# Patient Record
Sex: Female | Born: 1935 | Race: White | Hispanic: No | Marital: Married | Smoking: Former smoker
Health system: Southern US, Community
[De-identification: ages and names within clinical notes are randomized; demographics above are authoritative.]

## PROBLEM LIST (undated history)

## (undated) DIAGNOSIS — M1990 Unspecified osteoarthritis, unspecified site: Secondary | ICD-10-CM

## (undated) DIAGNOSIS — K219 Gastro-esophageal reflux disease without esophagitis: Secondary | ICD-10-CM

## (undated) DIAGNOSIS — L409 Psoriasis, unspecified: Secondary | ICD-10-CM

## (undated) DIAGNOSIS — I071 Rheumatic tricuspid insufficiency: Secondary | ICD-10-CM

## (undated) DIAGNOSIS — I2510 Atherosclerotic heart disease of native coronary artery without angina pectoris: Secondary | ICD-10-CM

## (undated) DIAGNOSIS — M1711 Unilateral primary osteoarthritis, right knee: Secondary | ICD-10-CM

## (undated) DIAGNOSIS — I219 Acute myocardial infarction, unspecified: Secondary | ICD-10-CM

## (undated) DIAGNOSIS — I519 Heart disease, unspecified: Secondary | ICD-10-CM

## (undated) DIAGNOSIS — I4891 Unspecified atrial fibrillation: Secondary | ICD-10-CM

## (undated) DIAGNOSIS — Z9981 Dependence on supplemental oxygen: Secondary | ICD-10-CM

## (undated) DIAGNOSIS — I10 Essential (primary) hypertension: Secondary | ICD-10-CM

## (undated) DIAGNOSIS — I482 Chronic atrial fibrillation, unspecified: Secondary | ICD-10-CM

## (undated) DIAGNOSIS — E785 Hyperlipidemia, unspecified: Secondary | ICD-10-CM

## (undated) DIAGNOSIS — I272 Pulmonary hypertension, unspecified: Secondary | ICD-10-CM

## (undated) HISTORY — DX: Hyperlipidemia, unspecified: E78.5

## (undated) HISTORY — PX: ACHILLES TENDON SURGERY: SHX542

## (undated) HISTORY — PX: EYE SURGERY: SHX253

## (undated) HISTORY — DX: Pulmonary hypertension, unspecified: I27.20

## (undated) HISTORY — DX: Rheumatic tricuspid insufficiency: I07.1

## (undated) HISTORY — DX: Heart disease, unspecified: I51.9

## (undated) HISTORY — PX: LAPAROSCOPIC CHOLECYSTECTOMY: SUR755

## (undated) HISTORY — DX: Psoriasis, unspecified: L40.9

## (undated) HISTORY — DX: Essential (primary) hypertension: I10

## (undated) HISTORY — DX: Morbid (severe) obesity due to excess calories: E66.01

## (undated) HISTORY — PX: COLONOSCOPY: SHX174

## (undated) HISTORY — DX: Chronic atrial fibrillation, unspecified: I48.20

## (undated) HISTORY — DX: Unilateral primary osteoarthritis, right knee: M17.11

## (undated) HISTORY — DX: Atherosclerotic heart disease of native coronary artery without angina pectoris: I25.10

## (undated) HISTORY — PX: RETINAL DETACHMENT SURGERY: SHX105

---

## 2001-12-18 ENCOUNTER — Ambulatory Visit (HOSPITAL_COMMUNITY): Admission: RE | Admit: 2001-12-18 | Discharge: 2001-12-18 | Payer: Self-pay | Admitting: Family Medicine

## 2001-12-18 ENCOUNTER — Encounter: Payer: Self-pay | Admitting: Family Medicine

## 2002-03-18 ENCOUNTER — Encounter: Payer: Self-pay | Admitting: Family Medicine

## 2002-03-18 ENCOUNTER — Ambulatory Visit (HOSPITAL_COMMUNITY): Admission: RE | Admit: 2002-03-18 | Discharge: 2002-03-18 | Payer: Self-pay | Admitting: Family Medicine

## 2002-04-26 ENCOUNTER — Encounter: Admission: RE | Admit: 2002-04-26 | Discharge: 2002-07-25 | Payer: Self-pay | Admitting: Family Medicine

## 2002-06-11 ENCOUNTER — Encounter: Payer: Self-pay | Admitting: Surgery

## 2002-06-16 ENCOUNTER — Encounter (INDEPENDENT_AMBULATORY_CARE_PROVIDER_SITE_OTHER): Payer: Self-pay | Admitting: Specialist

## 2002-06-16 ENCOUNTER — Observation Stay (HOSPITAL_COMMUNITY): Admission: RE | Admit: 2002-06-16 | Discharge: 2002-06-17 | Payer: Self-pay | Admitting: Surgery

## 2003-07-25 ENCOUNTER — Ambulatory Visit (HOSPITAL_COMMUNITY): Admission: RE | Admit: 2003-07-25 | Discharge: 2003-07-25 | Payer: Self-pay | Admitting: Family Medicine

## 2003-08-15 ENCOUNTER — Encounter: Admission: RE | Admit: 2003-08-15 | Discharge: 2003-08-15 | Payer: Self-pay | Admitting: Orthopedic Surgery

## 2003-08-18 ENCOUNTER — Ambulatory Visit (HOSPITAL_COMMUNITY): Admission: RE | Admit: 2003-08-18 | Discharge: 2003-08-18 | Payer: Self-pay | Admitting: Family Medicine

## 2003-09-28 ENCOUNTER — Ambulatory Visit (HOSPITAL_COMMUNITY): Admission: RE | Admit: 2003-09-28 | Discharge: 2003-09-28 | Payer: Self-pay | Admitting: Internal Medicine

## 2005-07-19 DIAGNOSIS — I219 Acute myocardial infarction, unspecified: Secondary | ICD-10-CM

## 2005-07-19 HISTORY — DX: Acute myocardial infarction, unspecified: I21.9

## 2005-07-19 HISTORY — PX: CORONARY ANGIOPLASTY WITH STENT PLACEMENT: SHX49

## 2005-07-21 ENCOUNTER — Inpatient Hospital Stay (HOSPITAL_COMMUNITY): Admission: EM | Admit: 2005-07-21 | Discharge: 2005-07-23 | Payer: Self-pay | Admitting: Emergency Medicine

## 2005-07-21 ENCOUNTER — Encounter: Payer: Self-pay | Admitting: Emergency Medicine

## 2005-07-21 ENCOUNTER — Ambulatory Visit: Payer: Self-pay | Admitting: Cardiology

## 2005-07-25 ENCOUNTER — Encounter: Payer: Self-pay | Admitting: Emergency Medicine

## 2005-07-26 ENCOUNTER — Inpatient Hospital Stay (HOSPITAL_COMMUNITY): Admission: EM | Admit: 2005-07-26 | Discharge: 2005-07-27 | Payer: Self-pay | Admitting: Emergency Medicine

## 2005-07-30 ENCOUNTER — Ambulatory Visit: Payer: Self-pay | Admitting: Cardiology

## 2005-08-02 ENCOUNTER — Inpatient Hospital Stay (HOSPITAL_COMMUNITY): Admission: EM | Admit: 2005-08-02 | Discharge: 2005-08-03 | Payer: Self-pay | Admitting: Emergency Medicine

## 2005-08-21 ENCOUNTER — Inpatient Hospital Stay (HOSPITAL_COMMUNITY): Admission: EM | Admit: 2005-08-21 | Discharge: 2005-08-22 | Payer: Self-pay | Admitting: Emergency Medicine

## 2005-08-21 ENCOUNTER — Ambulatory Visit: Payer: Self-pay | Admitting: *Deleted

## 2005-08-26 ENCOUNTER — Ambulatory Visit: Payer: Self-pay | Admitting: *Deleted

## 2005-08-29 ENCOUNTER — Encounter (HOSPITAL_COMMUNITY): Admission: RE | Admit: 2005-08-29 | Discharge: 2005-09-28 | Payer: Self-pay | Admitting: *Deleted

## 2005-08-30 ENCOUNTER — Ambulatory Visit: Payer: Self-pay | Admitting: Internal Medicine

## 2005-09-03 ENCOUNTER — Ambulatory Visit: Payer: Self-pay | Admitting: *Deleted

## 2005-09-11 ENCOUNTER — Ambulatory Visit: Payer: Self-pay | Admitting: *Deleted

## 2005-09-23 ENCOUNTER — Emergency Department (HOSPITAL_COMMUNITY): Admission: EM | Admit: 2005-09-23 | Discharge: 2005-09-23 | Payer: Self-pay | Admitting: Emergency Medicine

## 2005-09-30 ENCOUNTER — Encounter (HOSPITAL_COMMUNITY): Admission: RE | Admit: 2005-09-30 | Discharge: 2005-10-30 | Payer: Self-pay | Admitting: *Deleted

## 2005-10-23 ENCOUNTER — Ambulatory Visit: Payer: Self-pay | Admitting: *Deleted

## 2005-10-31 ENCOUNTER — Encounter (HOSPITAL_COMMUNITY): Admission: RE | Admit: 2005-10-31 | Discharge: 2005-11-30 | Payer: Self-pay | Admitting: *Deleted

## 2005-11-07 ENCOUNTER — Ambulatory Visit: Payer: Self-pay | Admitting: *Deleted

## 2005-12-02 ENCOUNTER — Encounter (HOSPITAL_COMMUNITY): Admission: RE | Admit: 2005-12-02 | Discharge: 2005-12-27 | Payer: Self-pay | Admitting: *Deleted

## 2005-12-04 ENCOUNTER — Ambulatory Visit: Payer: Self-pay | Admitting: *Deleted

## 2005-12-27 ENCOUNTER — Encounter (HOSPITAL_COMMUNITY): Admission: RE | Admit: 2005-12-27 | Discharge: 2006-01-26 | Payer: Self-pay | Admitting: *Deleted

## 2006-01-03 ENCOUNTER — Ambulatory Visit: Payer: Self-pay | Admitting: *Deleted

## 2006-01-27 ENCOUNTER — Ambulatory Visit: Payer: Self-pay | Admitting: *Deleted

## 2006-02-05 ENCOUNTER — Ambulatory Visit: Payer: Self-pay | Admitting: *Deleted

## 2006-02-26 ENCOUNTER — Encounter (HOSPITAL_COMMUNITY): Admission: RE | Admit: 2006-02-26 | Discharge: 2006-03-28 | Payer: Self-pay | Admitting: *Deleted

## 2006-05-20 ENCOUNTER — Ambulatory Visit: Payer: Self-pay | Admitting: Cardiology

## 2006-07-15 ENCOUNTER — Ambulatory Visit: Payer: Self-pay | Admitting: Cardiovascular Disease

## 2006-08-05 ENCOUNTER — Ambulatory Visit (HOSPITAL_COMMUNITY): Admission: RE | Admit: 2006-08-05 | Discharge: 2006-08-05 | Payer: Self-pay | Admitting: Family Medicine

## 2006-08-14 ENCOUNTER — Ambulatory Visit: Payer: Self-pay | Admitting: Cardiology

## 2006-08-24 ENCOUNTER — Emergency Department (HOSPITAL_COMMUNITY): Admission: EM | Admit: 2006-08-24 | Discharge: 2006-08-25 | Payer: Self-pay | Admitting: Emergency Medicine

## 2006-09-01 ENCOUNTER — Ambulatory Visit: Payer: Self-pay | Admitting: Gastroenterology

## 2006-09-18 ENCOUNTER — Ambulatory Visit: Payer: Self-pay | Admitting: Cardiology

## 2006-09-26 ENCOUNTER — Ambulatory Visit: Payer: Self-pay | Admitting: Gastroenterology

## 2006-10-02 ENCOUNTER — Ambulatory Visit: Payer: Self-pay | Admitting: Cardiology

## 2006-10-30 ENCOUNTER — Ambulatory Visit: Payer: Self-pay | Admitting: Cardiology

## 2006-11-27 ENCOUNTER — Ambulatory Visit: Payer: Self-pay | Admitting: Cardiology

## 2006-12-20 ENCOUNTER — Emergency Department (HOSPITAL_COMMUNITY): Admission: EM | Admit: 2006-12-20 | Discharge: 2006-12-20 | Payer: Self-pay | Admitting: Emergency Medicine

## 2006-12-30 ENCOUNTER — Ambulatory Visit: Payer: Self-pay | Admitting: Cardiology

## 2007-01-29 ENCOUNTER — Ambulatory Visit: Payer: Self-pay | Admitting: Internal Medicine

## 2007-03-02 ENCOUNTER — Ambulatory Visit: Payer: Self-pay | Admitting: Cardiology

## 2007-04-03 ENCOUNTER — Ambulatory Visit: Payer: Self-pay | Admitting: Internal Medicine

## 2007-05-01 ENCOUNTER — Ambulatory Visit: Payer: Self-pay | Admitting: Cardiology

## 2007-05-08 ENCOUNTER — Ambulatory Visit: Payer: Self-pay | Admitting: Internal Medicine

## 2007-05-11 ENCOUNTER — Ambulatory Visit (HOSPITAL_COMMUNITY): Admission: RE | Admit: 2007-05-11 | Discharge: 2007-05-11 | Payer: Self-pay | Admitting: Chiropractic Medicine

## 2007-05-11 ENCOUNTER — Ambulatory Visit: Payer: Self-pay | Admitting: Cardiology

## 2007-05-29 ENCOUNTER — Ambulatory Visit: Payer: Self-pay | Admitting: Internal Medicine

## 2007-07-03 ENCOUNTER — Ambulatory Visit: Payer: Self-pay | Admitting: Cardiology

## 2007-08-24 ENCOUNTER — Ambulatory Visit (HOSPITAL_COMMUNITY): Admission: RE | Admit: 2007-08-24 | Discharge: 2007-08-24 | Payer: Self-pay | Admitting: Family Medicine

## 2007-08-24 ENCOUNTER — Ambulatory Visit: Payer: Self-pay | Admitting: Cardiology

## 2007-09-21 ENCOUNTER — Ambulatory Visit: Payer: Self-pay | Admitting: Cardiology

## 2007-10-20 ENCOUNTER — Ambulatory Visit: Payer: Self-pay | Admitting: Cardiology

## 2007-11-20 ENCOUNTER — Ambulatory Visit: Payer: Self-pay | Admitting: Cardiology

## 2007-12-18 ENCOUNTER — Ambulatory Visit: Payer: Self-pay | Admitting: Internal Medicine

## 2007-12-24 ENCOUNTER — Ambulatory Visit: Payer: Self-pay | Admitting: Cardiology

## 2007-12-24 ENCOUNTER — Encounter (HOSPITAL_COMMUNITY): Admission: RE | Admit: 2007-12-24 | Discharge: 2008-01-23 | Payer: Self-pay | Admitting: Internal Medicine

## 2007-12-25 ENCOUNTER — Ambulatory Visit: Payer: Self-pay | Admitting: Internal Medicine

## 2008-01-12 ENCOUNTER — Ambulatory Visit: Payer: Self-pay | Admitting: Cardiology

## 2008-01-22 ENCOUNTER — Ambulatory Visit: Payer: Self-pay | Admitting: Cardiology

## 2008-02-26 ENCOUNTER — Ambulatory Visit: Payer: Self-pay | Admitting: Cardiology

## 2008-03-01 ENCOUNTER — Ambulatory Visit: Payer: Self-pay | Admitting: Cardiology

## 2008-03-15 ENCOUNTER — Ambulatory Visit: Payer: Self-pay | Admitting: Cardiology

## 2008-04-10 ENCOUNTER — Emergency Department (HOSPITAL_COMMUNITY): Admission: EM | Admit: 2008-04-10 | Discharge: 2008-04-10 | Payer: Self-pay | Admitting: Emergency Medicine

## 2008-04-14 ENCOUNTER — Ambulatory Visit: Payer: Self-pay | Admitting: Cardiology

## 2008-04-28 ENCOUNTER — Ambulatory Visit: Payer: Self-pay | Admitting: Cardiology

## 2008-05-19 ENCOUNTER — Ambulatory Visit: Payer: Self-pay | Admitting: Cardiology

## 2008-06-09 ENCOUNTER — Ambulatory Visit: Payer: Self-pay | Admitting: Cardiology

## 2008-07-11 ENCOUNTER — Ambulatory Visit: Payer: Self-pay | Admitting: Cardiology

## 2008-07-28 ENCOUNTER — Ambulatory Visit: Payer: Self-pay | Admitting: Cardiology

## 2008-08-29 ENCOUNTER — Ambulatory Visit: Payer: Self-pay | Admitting: Cardiology

## 2008-09-15 ENCOUNTER — Ambulatory Visit: Payer: Self-pay | Admitting: Cardiology

## 2008-10-10 ENCOUNTER — Ambulatory Visit: Payer: Self-pay | Admitting: Cardiology

## 2008-10-14 ENCOUNTER — Ambulatory Visit: Payer: Self-pay | Admitting: Cardiology

## 2008-11-17 ENCOUNTER — Ambulatory Visit: Payer: Self-pay | Admitting: Cardiology

## 2008-12-08 ENCOUNTER — Ambulatory Visit: Payer: Self-pay | Admitting: Cardiology

## 2008-12-29 ENCOUNTER — Ambulatory Visit: Payer: Self-pay | Admitting: Cardiology

## 2009-01-30 ENCOUNTER — Ambulatory Visit: Payer: Self-pay | Admitting: Cardiology

## 2009-02-10 ENCOUNTER — Ambulatory Visit (HOSPITAL_COMMUNITY): Admission: RE | Admit: 2009-02-10 | Discharge: 2009-02-10 | Payer: Self-pay | Admitting: Family Medicine

## 2009-03-06 ENCOUNTER — Ambulatory Visit: Payer: Self-pay | Admitting: Cardiology

## 2009-04-03 ENCOUNTER — Encounter: Payer: Self-pay | Admitting: *Deleted

## 2009-04-03 ENCOUNTER — Ambulatory Visit: Payer: Self-pay | Admitting: Cardiology

## 2009-04-17 ENCOUNTER — Encounter: Payer: Self-pay | Admitting: Cardiology

## 2009-04-17 DIAGNOSIS — E663 Overweight: Secondary | ICD-10-CM | POA: Insufficient documentation

## 2009-04-17 DIAGNOSIS — E785 Hyperlipidemia, unspecified: Secondary | ICD-10-CM

## 2009-04-17 DIAGNOSIS — I10 Essential (primary) hypertension: Secondary | ICD-10-CM | POA: Insufficient documentation

## 2009-04-18 ENCOUNTER — Ambulatory Visit: Payer: Self-pay | Admitting: Cardiology

## 2009-04-18 LAB — CONVERTED CEMR LAB
ALT: 17 units/L (ref 0–35)
AST: 28 units/L (ref 0–37)
Albumin: 3.7 g/dL (ref 3.5–5.2)
HDL: 39.6 mg/dL (ref >39.00)
LDL Cholesterol: 48 mg/dL (ref 0–99)
Total Bilirubin: 1 mg/dL (ref 0.3–1.2)
Total CHOL/HDL Ratio: 3
Total Protein: 7.1 g/dL (ref 6.0–8.3)

## 2009-05-01 ENCOUNTER — Ambulatory Visit: Payer: Self-pay

## 2009-05-29 ENCOUNTER — Ambulatory Visit: Payer: Self-pay | Admitting: Cardiology

## 2009-06-26 ENCOUNTER — Ambulatory Visit: Payer: Self-pay | Admitting: Cardiology

## 2009-07-31 ENCOUNTER — Ambulatory Visit: Payer: Self-pay | Admitting: Cardiology

## 2009-08-30 ENCOUNTER — Ambulatory Visit: Payer: Self-pay | Admitting: Cardiology

## 2009-09-28 ENCOUNTER — Ambulatory Visit: Payer: Self-pay | Admitting: Cardiology

## 2009-10-26 ENCOUNTER — Ambulatory Visit: Payer: Self-pay | Admitting: Cardiology

## 2009-11-23 ENCOUNTER — Ambulatory Visit: Payer: Self-pay | Admitting: Cardiology

## 2009-11-23 LAB — CONVERTED CEMR LAB: POC INR: 3.2

## 2009-12-27 ENCOUNTER — Ambulatory Visit: Payer: Self-pay | Admitting: Cardiology

## 2009-12-27 LAB — CONVERTED CEMR LAB: POC INR: 4

## 2010-01-03 ENCOUNTER — Ambulatory Visit: Payer: Self-pay | Admitting: Cardiology

## 2010-01-24 ENCOUNTER — Ambulatory Visit: Payer: Self-pay | Admitting: Cardiology

## 2010-01-24 LAB — CONVERTED CEMR LAB: POC INR: 3

## 2010-02-05 ENCOUNTER — Telehealth: Payer: Self-pay | Admitting: Gastroenterology

## 2010-02-06 ENCOUNTER — Encounter (INDEPENDENT_AMBULATORY_CARE_PROVIDER_SITE_OTHER): Payer: Self-pay | Admitting: *Deleted

## 2010-02-21 ENCOUNTER — Ambulatory Visit: Payer: Self-pay | Admitting: Cardiovascular Disease

## 2010-02-21 LAB — CONVERTED CEMR LAB: POC INR: 3.5

## 2010-03-19 ENCOUNTER — Ambulatory Visit: Payer: Self-pay | Admitting: Cardiology

## 2010-03-19 LAB — CONVERTED CEMR LAB: POC INR: 2.9

## 2010-04-03 ENCOUNTER — Ambulatory Visit: Payer: Self-pay | Admitting: Cardiology

## 2010-04-16 ENCOUNTER — Ambulatory Visit: Payer: Self-pay | Admitting: Cardiology

## 2010-04-17 ENCOUNTER — Ambulatory Visit: Payer: Self-pay | Admitting: Internal Medicine

## 2010-04-17 DIAGNOSIS — Z860100 Personal history of colon polyps, unspecified: Secondary | ICD-10-CM | POA: Insufficient documentation

## 2010-04-17 DIAGNOSIS — K219 Gastro-esophageal reflux disease without esophagitis: Secondary | ICD-10-CM

## 2010-04-19 ENCOUNTER — Telehealth: Payer: Self-pay | Admitting: Cardiology

## 2010-04-20 ENCOUNTER — Encounter: Payer: Self-pay | Admitting: Cardiology

## 2010-05-01 ENCOUNTER — Telehealth: Payer: Self-pay | Admitting: Cardiology

## 2010-05-07 ENCOUNTER — Ambulatory Visit: Payer: Self-pay | Admitting: Internal Medicine

## 2010-05-07 ENCOUNTER — Ambulatory Visit (HOSPITAL_COMMUNITY): Admission: RE | Admit: 2010-05-07 | Discharge: 2010-05-07 | Payer: Self-pay | Admitting: Internal Medicine

## 2010-05-09 ENCOUNTER — Encounter: Payer: Self-pay | Admitting: Internal Medicine

## 2010-05-14 ENCOUNTER — Ambulatory Visit: Payer: Self-pay | Admitting: Cardiology

## 2010-05-30 ENCOUNTER — Ambulatory Visit: Payer: Self-pay | Admitting: Cardiology

## 2010-06-27 ENCOUNTER — Ambulatory Visit: Payer: Self-pay | Admitting: Cardiology

## 2010-07-03 ENCOUNTER — Encounter: Payer: Self-pay | Admitting: Cardiology

## 2010-07-25 ENCOUNTER — Ambulatory Visit: Payer: Self-pay | Admitting: Cardiovascular Disease

## 2010-08-22 ENCOUNTER — Ambulatory Visit: Admission: RE | Admit: 2010-08-22 | Discharge: 2010-08-22 | Payer: Self-pay | Source: Home / Self Care

## 2010-08-22 LAB — CONVERTED CEMR LAB: POC INR: 2.2

## 2010-09-08 ENCOUNTER — Encounter: Payer: Self-pay | Admitting: Family Medicine

## 2010-09-18 NOTE — Medication Information (Signed)
Summary: ccr-lr  Anticoagulant Therapy  Managed by: Vashti Hey, RN PCP: Wyvonnia Dusky MD: Eden Emms MD, Theron Arista Indication 1: Atrial Fibrillation (ICD-427.31) Lab Used: [facility] [place] Site: [place] INR POC 2.2  Dietary changes: no    Health status changes: no    Bleeding/hemorrhagic complications: no    Recent/future hospitalizations: no    Any changes in medication regimen? no    Recent/future dental: no  Any missed doses?: no       Is patient compliant with meds? yes       Allergies: No Known Drug Allergies  Anticoagulation Management History:      The patient is taking warfarin and comes in today for a routine follow up visit.  Positive risk factors for bleeding include an age of 30 years or older.  The bleeding index is 'intermediate risk'.  Positive CHADS2 values include History of HTN.  Negative CHADS2 values include Age > 58 years old.  The start date was 08/22/2005.  Anticoagulation responsible provider: Eden Emms MD, Theron Arista.  INR POC: 2.2.  Cuvette Lot#: [id number].  Exp: 10/11.    Anticoagulation Management Assessment/Plan:      The patient's current anticoagulation dose is Warfarin sodium 5 mg tabs: Use as directed by Anticoagulation Clinic.  The target INR is 2 - 3.  The next INR is due 08/22/2010.  Anticoagulation instructions were given to patient.  Results were reviewed/authorized by Vashti Hey, RN.  She was notified by Vashti Hey RN.         Prior Anticoagulation Instructions: INR 2.0 Take coumadin 1 1/2 tablets tonight then resume 1 tablet once daily   Current Anticoagulation Instructions: INR 2.2 Continue coumadin 5mg  once daily

## 2010-09-18 NOTE — Letter (Signed)
Summary: Appointment - Reminder 2  [employer], Main Office  [address]   [place], [place] [address]   Phone: [phone]  Fax: [phone]     February 06, 2010 MRN: [id number]   [facility] [address] [place], [place]  [address]   Dear Ms. Lukic,  Our records indicate that it is time to schedule a follow-up appointment with Dr. Myrtis Ser in August. It is very important that we reach you to schedule this appointment. We look forward to participating in your health care needs. Please contact us at the number listed above at your earliest convenience to schedule your appointment.  If you are unable to make an appointment at this time, give [place] a call so we can update our records.     Sincerely,   Migdalia Dk [facility] Scheduling Team

## 2010-09-18 NOTE — Assessment & Plan Note (Signed)
Summary: per check out/sf  Medications Added HYDROCODONE-ACETAMINOPHEN 5-500 MG TABS (HYDROCODONE-ACETAMINOPHEN) 1/2 tab every 4 hrs as needed      Allergies Added: NKDA  Visit Type:  Follow-up Primary Provider:  [facility]  CC:  atrial fibrillation.  History of Present Illness: The patient is seen for follow up of atrial fibrillation.  She is actually doing well.  She's not having chest pain or shortness of breath.  He has no significant palpitations.  I saw her last April 18, 2009.  She does have coronary disease.  She had a bare-metal stent placed in December,,2006.  She has not had further exercise testing since then and we will consider this in the future  Current Medications (verified): 1)  Metoprolol Succinate 100 Mg Xr24h-Tab (Metoprolol Succinate) .... Take One Tablet By Mouth Daily 2)  Diovan 320 Mg Tabs (Valsartan) .... Take One Tablet By Mouth Daily 3)  Aspirin 81 Mg Tbec (Aspirin) .... Take One Tablet By Mouth Daily 4)  Warfarin Sodium 5 Mg Tabs (Warfarin Sodium) .... Use As Directed By Anticoagulation Clinic 5)  Furosemide 40 Mg Tabs (Furosemide) .[name].. 1 1/2 Tabs Once Daily 6)  Lipitor 80 Mg Tabs (Atorvastatin Calcium) .... Take One-Half Tablet By Mouth Daily. 7)  Klor-Con M20 20 Meq Cr-Tabs (Potassium Chloride Crys Cr) .[name].. 1 Tab By Mouth Once Daily 8)  Omeprazole 20 Mg Cpdr (Omeprazole) .... 2 Tabs Once Daily 9)  Nitroglycerin 0.4 Mg Subl (Nitroglycerin) .... One Tablet Under Tongue Every 5 Minutes As Needed For Chest Pain---May Repeat Times Three 10)  Hydrocodone-Acetaminophen 5-500 Mg Tabs (Hydrocodone-Acetaminophen) .... 1/2 Tab Every 4 Hrs As Needed 11)  Fexofenadine Hcl 180 Mg Tabs (Fexofenadine Hcl) .... As Needed 12)  Pepcid 20 Mg Tabs (Famotidine) .... As Needed 13)  Coq10 50 Mg Caps (Coenzyme Q10) .... 2 Tabs Once Daily 14)  Vitamin D 1000 Unit  Tabs (Cholecalciferol) .... Take One Tablet By Mouth Once Daily. 15)  Fish Oil   Oil (Fish Oil) .... 1000mg  Take One  Tablet By Mouth Two Times A Day . 16)  Multivitamins   Tabs (Multiple Vitamin) .... Take One Tablet By Mouth Twice Daily. 17)  Alprazolam 0.5 Mg Tabs (Alprazolam) .[name].. 1 1/2 At Bedtime  Allergies (verified): No Known Drug Allergies  Past History:  Past Medical History: 1. Atrial fibrillation CAD-status post bare-metal stent December 2006.. minor other coronary disease Coumadin therapy cholecystectomy Degenerative arthritis of the right knee-may need surgery in the future Morbid obesity 3. Dyslipidemia 4. Hypertension LV function good Psoriasis  Review of Systems       Patient denies fever, chills, headache, sweats, rash, change in vision, change in hearing, chest pain, cough, nausea vomiting, urinary symptoms.  All of the systems are reviewed and are negative.  Vital Signs:  Patient profile:   76 year old female Height:      63 inches Weight:      250 pounds BMI:     44.45 Pulse rate:   55 / minute BP sitting:   118 / 56  (right arm) Cuff size:   large  Vitals Entered By: Hardin Negus, RMA (April 03, 2010 3:31 PM)  Physical Exam  General:  patient is overweight and stable. Eyes:  no xanthelasma. Neck:  no jugular venous distention. Lungs:  lungs are clear.  Respiratory effort is nonlabored. Heart:  cardiac exam is S1-S2.  No clicks or significant murmurs. Abdomen:  abdomen is soft. Extremities:  no peripheral edema. Psych:  patient is oriented to person  time and place.  Affect is normal.   Impression & Recommendations:  Problem # 1:  OVERWEIGHT (ICD-278.02) patient needs to lose weight.  Problem # 2:  HYPERTENSION (ICD-401.9)  Her updated medication list for this problem includes:    Metoprolol Succinate 100 Mg Xr24h-tab (Metoprolol succinate) .[name]... Take one tablet by mouth daily    Diovan 320 Mg Tabs (Valsartan) .[name]... Take one tablet by mouth daily    Aspirin 81 Mg Tbec (Aspirin) .[name]... Take one tablet by mouth daily    Furosemide 40 Mg Tabs (Furosemide)  .[name]... 1 1/2 tabs once daily blood pressure is under good control.  No change in therapy.  Problem # 3:  CAD (ICD-414.00)  Her updated medication list for this problem includes:    Metoprolol Succinate 100 Mg Xr24h-tab (Metoprolol succinate) .[name]... Take one tablet by mouth daily    Aspirin 81 Mg Tbec (Aspirin) .[name]... Take one tablet by mouth daily    Warfarin Sodium 5 Mg Tabs (Warfarin sodium) ..... Use as directed by anticoagulation clinic    Nitroglycerin 0.4 Mg Subl (Nitroglycerin) ..... One tablet under tongue every 5 minutes as needed for chest pain---may repeat times three Coronary disease is stable.  EKG done today reviewed by me.  There is atrial fibrillation didn't change.  Problem # 4:  ATRIAL FIBRILLATION, HX OF (ICD-V12.59)  Her updated medication list for this problem includes:    Metoprolol Succinate 100 Mg Xr24h-tab (Metoprolol succinate) .[name]... Take one tablet by mouth daily    Aspirin 81 Mg Tbec (Aspirin) .[name]... Take one tablet by mouth daily    Warfarin Sodium 5 Mg Tabs (Warfarin sodium) ..... Use as directed by anticoagulation clinic    Nitroglycerin 0.4 Mg Subl (Nitroglycerin) ..... One tablet under tongue every 5 minutes as needed for chest pain---may repeat times three  Orders: EKG w/ Interpretation (93000) atrial fibrillation is controlled.  Patient is on Coumadin.  We talked about the fact that we may consider Pradaxa at a later date.

## 2010-09-18 NOTE — Letter (Signed)
Summary: TCS/EGD ORDER  TCS/EGD ORDER   Imported By: Ave Filter 04/17/2010 11:48:11  _____________________________________________________________________  External Attachment:    Type:   Image     Comment:   External Document

## 2010-09-18 NOTE — Progress Notes (Signed)
Summary: questions re going off coumadin   Phone Note Call from Patient   Caller: Patient [phone] Reason for Call: Talk to Nurse Summary of Call: pt to go off coumadin 9-15 for 4 days for a procedure -is this ok- and what problems she be on the look out for?pls call [phone] Initial call taken by: Glynda Jaeger,  May 01, 2010 4:50 PM  Follow-up for Phone Call        pt sch for upper and lower GI on Mon 9/19, Dr Rocky Crafts at [facility] is doing procedure and wants pt off coumadin for 4 days prior, will check w/Dr Myrtis Ser and call pt back on Thur 9/15 Meredith Staggers, RN  May 01, 2010 5:00 PM   Additional Follow-up for Phone Call Additional follow up Details #1::        OK to hold coumadin.  Talitha Givens, MD, [facility]  May 02, 2010 7:34 AM     Appended Document: questions re going off coumadin pt aware

## 2010-09-18 NOTE — Assessment & Plan Note (Signed)
Summary: extreme reflux-no pain/jbb   Primary Care Provider:  [facility]  Chief Complaint:  reflux.  History of Present Illness:  76 year old lady returns to our practice for consideration of surveillance colonoscopy, given history of colonic polyps and refractory GERD. She saw Dr. Karilyn Cota previously.   EGD demonstrated no  complications of GERD; she had no Barrett's esophagus. Prior colonoscopy demonstrated an adenoma in 2005. Recommend she have a followup examination 2010.  She decided to see Dr. Christella Hartigan.  H recommended and EGD; she did not follow-through. She's been taking omeprazole 20-40 mg daily -  has not had a follow-up EGD orTCS anywhere.  She is not  having any dysphagia but continues to have 2-3 episodes of breakthrough symptoms weekly even though she's developed a more healthy lifestyle and has lost 40 pounds since 2005. She has not had any  melena,  rectal bleeding or  dysphagia. She is taking Coumadin because of atrial fibrillation..  Current Medications (verified): 1)  Metoprolol Succinate 100 Mg Xr24h-Tab (Metoprolol Succinate) .... Take One Tablet By Mouth Daily 2)  Diovan 320 Mg Tabs (Valsartan) .... Take One Tablet By Mouth Daily 3)  Aspirin 81 Mg Tbec (Aspirin) .... Take One Tablet By Mouth Daily 4)  Warfarin Sodium 7.5 Mg Tabs (Warfarin Sodium) .... Use As Directed By Anticoagulation Clinic 5)  Furosemide 40 Mg Tabs (Furosemide) .[name].. 1 1/2 Tabs Once Daily 6)  Lipitor 80 Mg Tabs (Atorvastatin Calcium) .... Take One-Half Tablet By Mouth Daily. 7)  Klor-Con M20 20 Meq Cr-Tabs (Potassium Chloride Crys Cr) .[name].. 1 Tab By Mouth Once Daily 8)  Omeprazole 20 Mg Cpdr (Omeprazole) .... 2 Tabs Once Daily 9)  Nitroglycerin 0.4 Mg Subl (Nitroglycerin) .... One Tablet Under Tongue Every 5 Minutes As Needed For Chest Pain---May Repeat Times Three 10)  Hydrocodone-Acetaminophen 5-500 Mg Tabs (Hydrocodone-Acetaminophen) .... 1/2 Tab Every 4 Hrs As Needed 11)  Fexofenadine Hcl 180 Mg Tabs  (Fexofenadine Hcl) .... As Needed 12)  Pepcid 20 Mg Tabs (Famotidine) .... As Needed 13)  Coq10 50 Mg Caps (Coenzyme Q10) .... 2 Tabs Once Daily 14)  Vitamin D 1000 Unit  Tabs (Cholecalciferol) .... Take One Tablet By Mouth Once Daily. 15)  Fish Oil   Oil (Fish Oil) .... 1000mg  Take One Tablet By Mouth Two Times A Day . 16)  Multivitamins   Tabs (Multiple Vitamin) .... Take One Tablet By Mouth Twice Daily. 17)  Alprazolam 0.5 Mg Tabs (Alprazolam) .[name].. 1 1/2 At Bedtime  Allergies (verified): No Known Drug Allergies  Past History:  Family History: Last updated: 2008-10-30 Mother died of MI at age 52.  Father had CAD and died of a  CVA at age 59.  She has one brother living.  Three brothers are deceased.  One of MI at 56, one of CHF in his 65s, and one died of COPD/emphysema, and  the third brother had advanced COPD but committed suicide at 74.  One sister  had COPD and died at 51, another one died of cardiac problems in her 13s  Social History: Last updated: 10-30-2008 Retired  Married  Tobacco Use - No.  Alcohol Use - no  Risk Factors: Smoking Status: never (October 30, 2008)  Past Medical History: 1. Atrial fibrillation CAD-status post bare-metal stent December 2006.. minor other coronary disease Coumadin therapy Degenerative arthritis of the right knee-may need surgery in the future Morbid obesity 3. Dyslipidemia 4. Hypertension LV function good Psoriasis  Past Surgical History: Cholecystectomy Right ankle surgery  Vital Signs:  Patient profile:  76 year old female Height:      63 inches Weight:      254 pounds BMI:     45.16 Temp:     98.1 degrees F oral Pulse rate:   56 / minute BP sitting:   112 / 70  (left arm) Cuff size:   large  Vitals Entered By: Cloria Spring LPN (April 17, 2010 11:00 AM)  Physical Exam  General:  alert conversant lady in no acute distress Lungs:  clear to auscultation Heart:  regular rate and rhythm without murmur gallop  rub Abdomen:  massively obese positive bowel sounds soft nontender without mass or organomegaly Rectal:  deferred to colonoscopy  Impression & Recommendations: Impression: 76 year old lady with history of colonic adenoma somewhat overdue for surveillance at this time. History of long-standing GERD somewhat refractory to even omeprazole 40 mg orally daily. Prior EGD demonstrated no complicating factors related to GERD. She is concerned about ongoing symptoms and has asked to have her upper GI tract looked at once again. This is not unreasonable. Recommendations: Offered this nice lady both diagnostic EGD and a surveillance colonoscopy in the near future. Risks, benefits, limitations, alternatives and imponderables have been reviewed. Questions been answered; she is agreeable. I recommend she hold Coumadin for 4 days prior to the procedures. Discussed the risks of this approach as well. She is agreeable.  Further recommendations to follow.  Appended Document: Orders Update    Clinical Lists Changes  Problems: Added new problem of GERD (ICD-530.81) Added new problem of COLONIC POLYPS, HX OF (ICD-V12.72) Orders: Added new Service order of New Patient Level IV (10272) - Signed

## 2010-09-18 NOTE — Medication Information (Signed)
Summary: ccr-lr  Anticoagulant Therapy  Managed by: Vashti Hey, RN PCP: Tye Savoy, MD Supervising MD: Dietrich Pates MD, Molly Maduro Indication 1: Atrial Fibrillation (ICD-427.31) Lab Used: [facility] [place] Site: [place] INR POC 2.9  Dietary changes: no    Health status changes: no    Bleeding/hemorrhagic complications: no    Recent/future hospitalizations: no    Any changes in medication regimen? yes       Details: Took z-pack 2 weeks ago for fluid in ear  Recent/future dental: no  Any missed doses?: no       Is patient compliant with meds? yes       Allergies: No Known Drug Allergies  Anticoagulation Management History:      The patient is taking warfarin and comes in today for a routine follow up visit.  Positive risk factors for bleeding include an age of 75 years or older.  The bleeding index is 'intermediate risk'.  Positive CHADS2 values include History of HTN.  Negative CHADS2 values include Age > 56 years old.  The start date was 08/22/2005.  Anticoagulation responsible provider: Dietrich Pates MD, Molly Maduro.  INR POC: 2.9.  Cuvette Lot#: [id number].  Exp: 10/11.    Anticoagulation Management Assessment/Plan:      The patient's current anticoagulation dose is Warfarin sodium 5 mg tabs: Use as directed by Anticoagulation Clinic.  The target INR is 2 - 3.  The next INR is due 04/16/2010.  Anticoagulation instructions were given to patient.  Results were reviewed/authorized by Vashti Hey, RN.  She was notified by Vashti Hey RN.         Prior Anticoagulation Instructions: INR 3.5 Hold coumadin tonight then decrease dose to 5mg  once daily   Current Anticoagulation Instructions: INR 2.9 Continue coumadin 5mg  once daily

## 2010-09-18 NOTE — Medication Information (Signed)
Summary: PROTIME DUE TO BEING ON ANTIBIOTICS/TG  Anticoagulant Therapy  Managed by: Vashti Hey, RN PCP: Tye Savoy, MD Supervising MD: Diona Browner MD, Remi Deter Indication 1: Atrial Fibrillation (ICD-427.31) Lab Used: [facility] [place] Site: [place] INR POC 2.9  Dietary changes: no    Health status changes: no    Bleeding/hemorrhagic complications: no    Recent/future hospitalizations: no    Any changes in medication regimen? yes       Details: Took diflucan thursday last week  Recent/future dental: no  Any missed doses?: no       Is patient compliant with meds? yes       Allergies: No Known Drug Allergies  Anticoagulation Management History:      The patient is taking warfarin and comes in today for a routine follow up visit.  Positive risk factors for bleeding include an age of 74 years or older.  The bleeding index is 'intermediate risk'.  Positive CHADS2 values include History of HTN.  Negative CHADS2 values include Age > 40 years old.  The start date was 08/22/2005.  Anticoagulation responsible provider: Diona Browner MD, Remi Deter.  INR POC: 2.9.  Cuvette Lot#: [id number].  Exp: 10/11.    Anticoagulation Management Assessment/Plan:      The patient's current anticoagulation dose is Warfarin sodium 5 mg tabs: Use as directed by Anticoagulation Clinic.  The target INR is 2 - 3.  The next INR is due 01/25/2010.  Anticoagulation instructions were given to patient.  Results were reviewed/authorized by Vashti Hey, RN.  She was notified by Vashti Hey RN.         Prior Anticoagulation Instructions: INR 4.0 Hold coumadin tonight then decrease dose to 5mg  once daily except 7.5mg  on Tuesdays  Current Anticoagulation Instructions: INR 2.9 Continue coumadin 5mg  once daily except 7.5mg  on Tuesdays

## 2010-09-18 NOTE — Medication Information (Signed)
Summary: ccr-lr  Anticoagulant Therapy  Managed by: Vashti Hey, RN PCP: Tye Savoy, MD Supervising MD: Diona Browner MD, Remi Deter Indication 1: Atrial Fibrillation (ICD-427.31) Lab Used: [facility] [place] Site: [place] INR POC 2.5  Dietary changes: no    Health status changes: no    Bleeding/hemorrhagic complications: no    Recent/future hospitalizations: no    Any changes in medication regimen? no    Recent/future dental: no  Any missed doses?: no       Is patient compliant with meds? yes       Allergies: No Known Drug Allergies  Anticoagulation Management History:      The patient is taking warfarin and comes in today for a routine follow up visit.  Positive risk factors for bleeding include an age of 74 years or older.  The bleeding index is 'intermediate risk'.  Positive CHADS2 values include History of HTN.  Negative CHADS2 values include Age > 74 years old.  The start date was 08/22/2005.  Anticoagulation responsible provider: Diona Browner MD, Remi Deter.  INR POC: 2.5.  Cuvette Lot#: [id number].  Exp: 10/11.    Anticoagulation Management Assessment/Plan:      The patient's current anticoagulation dose is Warfarin sodium 5 mg tabs: Use as directed by Anticoagulation Clinic.  The target INR is 2 - 3.  The next INR is due 10/26/2009.  Anticoagulation instructions were given to patient.  Results were reviewed/authorized by Vashti Hey, RN.  She was notified by Vashti Hey RN.         Prior Anticoagulation Instructions: INR 1.9 Take coumadin 1 1/2 tablets tonight then  resume 1 tablet once daily except 1 1/2 tablets on Sundays, Tuesdays and Thursdays  Current Anticoagulation Instructions: INR 2.5 Continue coumadin 5mg  once daily except 7.5mg  on Sundays, Tuesdays and Thursdays

## 2010-09-18 NOTE — Progress Notes (Signed)
Summary: pt has questions   Phone Note Call from Patient Call back at Home Phone [phone]   Caller: Patient Reason for Call: Talk to Nurse, Talk to Doctor Summary of Call: pt gets sweaty when she walks around the house and was wondering where this would come from cause it never happen before Initial call taken by: Omer Jack,  April 19, 2010 2:10 PM  Follow-up for Phone Call        spoke w/pt she is having hot flashes and has had to turn up her air conditioner, no sob, no chest pain advised did not seem cardiac she is agreeable and already has an appt w/pcp in am Meredith Staggers, RN  April 19, 2010 5:45 PM

## 2010-09-18 NOTE — Medication Information (Signed)
Summary: ccr-lr  Anticoagulant Therapy  Managed by: Vashti Hey, RN PCP: Tye Savoy, MD Supervising MD: Dietrich Pates MD, Molly Maduro Indication 1: Atrial Fibrillation (ICD-427.31) Lab Used: [facility] [place] Site: [place] INR POC 1.9  Dietary changes: no    Health status changes: no    Bleeding/hemorrhagic complications: no    Recent/future hospitalizations: no    Any changes in medication regimen? yes       Details: darvocet changes to hydrocodone 1- 1 1/2 qd   Recent/future dental: no  Any missed doses?: yes     Details: missed 1 dose  Is patient compliant with meds? yes       Allergies: No Known Drug Allergies  Anticoagulation Management History:      The patient is taking warfarin and comes in today for a routine follow up visit.  Positive risk factors for bleeding include an age of 74 years or older.  The bleeding index is 'intermediate risk'.  Positive CHADS2 values include History of HTN.  Negative CHADS2 values include Age > 74 years old.  The start date was 08/22/2005.  Anticoagulation responsible provider: Dietrich Pates MD, Molly Maduro.  INR POC: 1.9.  Cuvette Lot#: [id number].  Exp: 10/11.    Anticoagulation Management Assessment/Plan:      The patient's current anticoagulation dose is Warfarin sodium 5 mg tabs: Use as directed by Anticoagulation Clinic.  The target INR is 2 - 3.  The next INR is due 11/23/2009.  Anticoagulation instructions were given to patient.  Results were reviewed/authorized by Vashti Hey, RN.  She was notified by Vashti Hey RN.         Prior Anticoagulation Instructions: INR 2.5 Continue coumadin 5mg  once daily except 7.5mg  on Sundays, Tuesdays and Thursdays  Current Anticoagulation Instructions: INR 1.9 Take coumadin 2 tablets tonight then resume 1 tablet once daily except 1 1/2 tablets on Sundays, Tuesdays and Thursdays

## 2010-09-18 NOTE — Medication Information (Signed)
Summary: ccr-lr  Anticoagulant Therapy  Managed by: Vashti Hey, RN PCP: Tye Savoy, MD Supervising MD: Dietrich Pates MD, Molly Maduro Indication 1: Atrial Fibrillation (ICD-427.31) Lab Used: [facility] [place] Site: [place] INR POC 3.2  Dietary changes: no    Health status changes: no    Bleeding/hemorrhagic complications: no    Recent/future hospitalizations: no    Any changes in medication regimen? no    Recent/future dental: no  Any missed doses?: no       Is patient compliant with meds? yes       Allergies: No Known Drug Allergies  Anticoagulation Management History:      The patient is taking warfarin and comes in today for a routine follow up visit.  Positive risk factors for bleeding include an age of 50 years or older.  The bleeding index is 'intermediate risk'.  Positive CHADS2 values include History of HTN.  Negative CHADS2 values include Age > 55 years old.  The start date was 08/22/2005.  Anticoagulation responsible provider: Dietrich Pates MD, Molly Maduro.  INR POC: 3.2.  Cuvette Lot#: [id number].  Exp: 10/11.    Anticoagulation Management Assessment/Plan:      The patient's current anticoagulation dose is Warfarin sodium 5 mg tabs: Use as directed by Anticoagulation Clinic.  The target INR is 2 - 3.  The next INR is due 12/21/2009.  Anticoagulation instructions were given to patient.  Results were reviewed/authorized by Vashti Hey, RN.  She was notified by Vashti Hey RN.         Prior Anticoagulation Instructions: INR 1.9 Take coumadin 2 tablets tonight then resume 1 tablet once daily except 1 1/2 tablets on Sundays, Tuesdays and Thursdays  Current Anticoagulation Instructions: INR 3.2 Take coumadin 1 tablet tonight then resume 1 tablet once daily except 1 1/2 tablets on Sundays, Tuesdays and Thursdays

## 2010-09-18 NOTE — Medication Information (Signed)
Summary: ccr-lr  Anticoagulant Therapy  Managed by: Vashti Hey, RN PCP: Wyvonnia Dusky MD: Dietrich Pates MD, Molly Maduro Indication 1: Atrial Fibrillation (ICD-427.31) Lab Used: [facility] [place] Site: [place] INR POC 2.1  Dietary changes: no    Health status changes: no    Bleeding/hemorrhagic complications: no    Recent/future hospitalizations: no    Any changes in medication regimen? no    Recent/future dental: no  Any missed doses?: no       Is patient compliant with meds? yes       Allergies: No Known Drug Allergies  Anticoagulation Management History:      The patient is taking warfarin and comes in today for a routine follow up visit.  Positive risk factors for bleeding include an age of 2 years or older.  The bleeding index is 'intermediate risk'.  Positive CHADS2 values include History of HTN.  Negative CHADS2 values include Age > 61 years old.  The start date was 08/22/2005.  Anticoagulation responsible provider: Dietrich Pates MD, Molly Maduro.  INR POC: 2.1.  Exp: 10/11.    Anticoagulation Management Assessment/Plan:      The patient's current anticoagulation dose is Warfarin sodium 5 mg tabs: Use as directed by Anticoagulation Clinic.  The target INR is 2 - 3.  The next INR is due 06/27/2010.  Anticoagulation instructions were given to patient.  Results were reviewed/authorized by Vashti Hey, RN.  She was notified by Vashti Hey RN.         Prior Anticoagulation Instructions: INR 1.7 Take coumadin 1 1/2 tablets tonight then resume 1 tablet once daily   Current Anticoagulation Instructions: INR 2.1 Continue coumadin 5mg  once daily

## 2010-09-18 NOTE — Medication Information (Signed)
Summary: ccr-lr  Anticoagulant Therapy  Managed by: Vashti Hey, RN PCP: Tye Savoy, MD Supervising MD: Diona Browner MD, Remi Deter Indication 1: Atrial Fibrillation (ICD-427.31) Lab Used: [facility] [place] Site: [place] INR POC 3.0  Dietary changes: no    Health status changes: no    Bleeding/hemorrhagic complications: no    Recent/future hospitalizations: no    Any changes in medication regimen? no    Recent/future dental: no  Any missed doses?: no       Is patient compliant with meds? yes       Allergies: No Known Drug Allergies  Anticoagulation Management History:      The patient is taking warfarin and comes in today for a routine follow up visit.  Positive risk factors for bleeding include an age of 50 years or older.  The bleeding index is 'intermediate risk'.  Positive CHADS2 values include History of HTN.  Negative CHADS2 values include Age > 54 years old.  The start date was 08/22/2005.  Anticoagulation responsible provider: Diona Browner MD, Remi Deter.  INR POC: 3.0.  Cuvette Lot#: [id number].  Exp: 10/11.    Anticoagulation Management Assessment/Plan:      The patient's current anticoagulation dose is Warfarin sodium 5 mg tabs: Use as directed by Anticoagulation Clinic.  The target INR is 2 - 3.  The next INR is due 02/21/2010.  Anticoagulation instructions were given to patient.  Results were reviewed/authorized by Vashti Hey, RN.  She was notified by Vashti Hey RN.         Prior Anticoagulation Instructions: INR 2.9 Continue coumadin 5mg  once daily except 7.5mg  on Tuesdays  Current Anticoagulation Instructions: INR 3.0 Continue coumadin 5mg  once daily except 7.5mg  on Tuesdays Increase greens

## 2010-09-18 NOTE — Medication Information (Signed)
Summary: ccr-lr  Anticoagulant Therapy  Managed by: Vashti Hey, RN PCP: Judene Companion MD: Dietrich Pates MD, Molly Maduro Indication 1: Atrial Fibrillation (ICD-427.31) Lab Used: [facility] [place] Site: [place] INR POC 1.8  Dietary changes: no    Health status changes: no    Bleeding/hemorrhagic complications: no    Recent/future hospitalizations: no    Any changes in medication regimen? no    Recent/future dental: no  Any missed doses?: yes     Details: Missed 1 dose last week  Is patient compliant with meds? yes       Allergies: No Known Drug Allergies  Anticoagulation Management History:      The patient is taking warfarin and comes in today for a routine follow up visit.  Positive risk factors for bleeding include an age of 74 years or older.  The bleeding index is 'intermediate risk'.  Positive CHADS2 values include History of HTN.  Negative CHADS2 values include Age > 11 years old.  The start date was 08/22/2005.  Anticoagulation responsible Shoshanna Mcquitty: Dietrich Pates MD, Molly Maduro.  INR POC: 1.8.  Cuvette Lot#: [id number].  Exp: 10/11.    Anticoagulation Management Assessment/Plan:      The patient's current anticoagulation dose is Warfarin sodium 5 mg tabs: Use as directed by Anticoagulation Clinic.  The target INR is 2 - 3.  The next INR is due 05/14/2010.  Anticoagulation instructions were given to patient.  Results were reviewed/authorized by Vashti Hey, RN.  She was notified by Vashti Hey RN.         Prior Anticoagulation Instructions: INR 2.9 Continue coumadin 5mg  once daily   Current Anticoagulation Instructions: INR 1.8 Take coumadin 1 1/2 tablets tonight then resume 1 tablet once daily

## 2010-09-18 NOTE — Medication Information (Signed)
Summary: ccr-lr  Anticoagulant Therapy  Managed by: Vashti Hey, RN PCP: Wyvonnia Dusky MD: Dietrich Pates MD, Molly Maduro Indication 1: Atrial Fibrillation (ICD-427.31) Lab Used: [facility] [place] Site: [place] INR POC 1.7  Dietary changes: no    Health status changes: no    Bleeding/hemorrhagic complications: no    Recent/future hospitalizations: yes       Details: had upper and lower GI  05/07/10  Any changes in medication regimen? no    Recent/future dental: no  Any missed doses?: yes     Details: Was off coumadin from 9/15 - 9/19 for above procedure  Is patient compliant with meds? yes       Allergies: No Known Drug Allergies  Anticoagulation Management History:      The patient is taking warfarin and comes in today for a routine follow up visit.  Positive risk factors for bleeding include an age of 74 years or older.  The bleeding index is 'intermediate risk'.  Positive CHADS2 values include History of HTN.  Negative CHADS2 values include Age > 26 years old.  The start date was 08/22/2005.  Anticoagulation responsible Roc Streett: Dietrich Pates MD, Molly Maduro.  INR POC: 1.7.  Cuvette Lot#: [id number].  Exp: 10/11.    Anticoagulation Management Assessment/Plan:      The target INR is 2 - 3.  The next INR is due 05/31/2010.  Anticoagulation instructions were given to patient.  Results were reviewed/authorized by Vashti Hey, RN.  She was notified by Vashti Hey RN.         Prior Anticoagulation Instructions: INR 1.8 Take coumadin 1 1/2 tablets tonight then resume 1 tablet once daily   Current Anticoagulation Instructions: INR 1.7 Take coumadin 1 1/2 tablets tonight then resume 1 tablet once daily

## 2010-09-18 NOTE — Medication Information (Signed)
Summary: ccr-lr  Anticoagulant Therapy  Managed by: Vashti Hey, RN PCP: Tye Savoy, MD Supervising MD: Diona Browner MD, Remi Deter Indication 1: Atrial Fibrillation (ICD-427.31) Lab Used: [facility] [place] Site: [place] INR POC 4.0  Dietary changes: no    Health status changes: no    Bleeding/hemorrhagic complications: no    Recent/future hospitalizations: no    Any changes in medication regimen? no    Recent/future dental: no  Any missed doses?: no       Is patient compliant with meds? yes       Allergies: No Known Drug Allergies  Anticoagulation Management History:      The patient is taking warfarin and comes in today for a routine follow up visit.  Positive risk factors for bleeding include an age of 74 years or older.  The bleeding index is 'intermediate risk'.  Positive CHADS2 values include History of HTN.  Negative CHADS2 values include Age > 74 years old.  The start date was 08/22/2005.  Anticoagulation responsible provider: Diona Browner MD, Remi Deter.  INR POC: 4.0.  Cuvette Lot#: [id number].  Exp: 10/11.    Anticoagulation Management Assessment/Plan:      The patient's current anticoagulation dose is Warfarin sodium 5 mg tabs: Use as directed by Anticoagulation Clinic.  The target INR is 2 - 3.  The next INR is due 01/11/2010.  Anticoagulation instructions were given to patient.  Results were reviewed/authorized by Vashti Hey, RN.  She was notified by Vashti Hey RN.         Prior Anticoagulation Instructions: INR 3.2 Take coumadin 1 tablet tonight then resume 1 tablet once daily except 1 1/2 tablets on Sundays, Tuesdays and Thursdays  Current Anticoagulation Instructions: INR 4.0 Hold coumadin tonight then decrease dose to 5mg  once daily except 7.5mg  on Tuesdays

## 2010-09-18 NOTE — Medication Information (Signed)
Summary: ccr-lr  Anticoagulant Therapy  Managed by: Vashti Hey, RN PCP: Wyvonnia Dusky MD: Dietrich Pates MD, Molly Maduro Indication 1: Atrial Fibrillation (ICD-427.31) Lab Used: [facility] [place] Site: [place] INR POC 2.0  Dietary changes: no    Health status changes: no    Bleeding/hemorrhagic complications: no    Recent/future hospitalizations: no    Any changes in medication regimen? no    Recent/future dental: no  Any missed doses?: no       Is patient compliant with meds? yes       Allergies: No Known Drug Allergies  Anticoagulation Management History:      The patient is taking warfarin and comes in today for a routine follow up visit.  Positive risk factors for bleeding include an age of 74 years or older.  The bleeding index is 'intermediate risk'.  Positive CHADS2 values include History of HTN.  Negative CHADS2 values include Age > 74 years old.  The start date was 08/22/2005.  Anticoagulation responsible Tadhg Eskew: Dietrich Pates MD, Molly Maduro.  INR POC: 2.0.  Cuvette Lot#: [id number].  Exp: 10/11.    Anticoagulation Management Assessment/Plan:      The patient's current anticoagulation dose is Warfarin sodium 5 mg tabs: Use as directed by Anticoagulation Clinic.  The target INR is 2 - 3.  The next INR is due 07/25/2010.  Anticoagulation instructions were given to patient.  Results were reviewed/authorized by Vashti Hey, RN.  She was notified by Vashti Hey RN.         Prior Anticoagulation Instructions: INR 2.1 Continue coumadin 5mg  once daily   Current Anticoagulation Instructions: INR 2.0 Take coumadin 1 1/2 tablets tonight then resume 1 tablet once daily

## 2010-09-18 NOTE — Letter (Signed)
Summary: Patient Notice, Colon Biopsy Results  [facility]  [address]   [place], [place] [address]   Phone: [phone]  Fax: [phone]       May 09, 2010   [name] [address] [place], [place]  [address] [birth year]    Dear Ms. Hornak,  I am pleased to inform you that the biopsies taken during your recent colonoscopy did not show any evidence of cancer upon pathologic examination.  Additional information/recommendations:  You should have a repeat colonoscopy examination  in 5 years.  Please call us if you are having persistent problems or have questions about your condition that have not been fully answered at this time.  Sincerely,    R. Roetta Sessions MD, FACP [facility] Ph: [phone]    Fax: [phone]   Appended Document: Patient Notice, Colon Biopsy Results Letter mailed to pt.  Appended Document: Patient Notice, Colon Biopsy Results Reminder placed in idx.

## 2010-09-18 NOTE — Medication Information (Signed)
Summary: ccr-lr  Anticoagulant Therapy  Managed by: Vashti Hey, RN PCP: Tye Savoy, MD Supervising MD: Eden Emms MD, Theron Arista Indication 1: Atrial Fibrillation (ICD-427.31) Lab Used: [facility] [place] Site: [place] INR POC 3.5  Dietary changes: no    Health status changes: no    Bleeding/hemorrhagic complications: no    Recent/future hospitalizations: no    Any changes in medication regimen? no    Recent/future dental: no  Any missed doses?: no       Is patient compliant with meds? yes       Allergies: No Known Drug Allergies  Anticoagulation Management History:      The patient is taking warfarin and comes in today for a routine follow up visit.  Positive risk factors for bleeding include an age of 22 years or older.  The bleeding index is 'intermediate risk'.  Positive CHADS2 values include History of HTN.  Negative CHADS2 values include Age > 55 years old.  The start date was 08/22/2005.  Anticoagulation responsible provider: Eden Emms MD, Theron Arista.  INR POC: 3.5.  Cuvette Lot#: [id number].  Exp: 10/11.    Anticoagulation Management Assessment/Plan:      The patient's current anticoagulation dose is Warfarin sodium 5 mg tabs: Use as directed by Anticoagulation Clinic.  The target INR is 2 - 3.  The next INR is due 03/19/2010.  Anticoagulation instructions were given to patient.  Results were reviewed/authorized by Vashti Hey, RN.  She was notified by Vashti Hey RN.         Prior Anticoagulation Instructions: INR 3.0 Continue coumadin 5mg  once daily except 7.5mg  on Tuesdays Increase greens  Current Anticoagulation Instructions: INR 3.5 Hold coumadin tonight then decrease dose to 5mg  once daily

## 2010-09-18 NOTE — Progress Notes (Signed)
Summary: Change GI Practices   Phone Note Outgoing Call   Call placed by: Lamona Curl CMA Duncan Dull),  February 05, 2010 4:37 PM Call placed to: Patient Summary of Call: Called patient to advise her that she is overdue for colonoscopy. She states that she never recieved the letter we sent out but that she will be having her colonoscopy in [place]. I have advised [name], [facility] of this and we will enter change of practice intent in our system. Initial call taken by: Lamona Curl CMA Duncan Dull),  February 05, 2010 4:38 PM

## 2010-09-18 NOTE — Medication Information (Signed)
Summary: ccr-lr  Anticoagulant Therapy  Managed by: Vashti Hey, RN PCP: Tye Savoy, MD Supervising MD: Dietrich Pates MD, Molly Maduro Indication 1: Atrial Fibrillation (ICD-427.31) Lab Used: [facility]  Site: [place]  Dietary changes: no    Health status changes: no    Bleeding/hemorrhagic complications: no    Recent/future hospitalizations: no    Any changes in medication regimen? no    Recent/future dental: no  Any missed doses?: no       Is patient compliant with meds? yes       Allergies: No Known Drug Allergies  Anticoagulation Management History:      The patient is taking warfarin and comes in today for a routine follow up visit.  Positive risk factors for bleeding include an age of 74 years or older.  The bleeding index is 'intermediate risk'.  Positive CHADS2 values include History of HTN.  Negative CHADS2 values include Age > 19 years old.  The start date was 08/22/2005.  Anticoagulation responsible provider: Dietrich Pates MD, Molly Maduro.  Cuvette Lot#: [id number].  Exp: 10/11.    Anticoagulation Management Assessment/Plan:      The patient's current anticoagulation dose is Warfarin sodium 5 mg tabs: Use as directed by Anticoagulation Clinic.  The target INR is 2 - 3.  The next INR is due 09/27/2009.  Anticoagulation instructions were given to patient.  Results were reviewed/authorized by Vashti Hey, RN.  She was notified by Vashti Hey RN.         Prior Anticoagulation Instructions: INR 2.3 Continue coumadin 5mg  once daily except 7.5mg  on Sundays, Tuesdays and Thursdays  Current Anticoagulation Instructions: INR 1.9 Take coumadin 1 1/2 tablets tonight then  resume 1 tablet once daily except 1 1/2 tablets on Sundays, Tuesdays and Thursdays

## 2010-09-19 ENCOUNTER — Encounter: Payer: Self-pay | Admitting: Cardiology

## 2010-09-19 ENCOUNTER — Ambulatory Visit: Admit: 2010-09-19 | Payer: Self-pay

## 2010-09-20 ENCOUNTER — Ambulatory Visit (INDEPENDENT_AMBULATORY_CARE_PROVIDER_SITE_OTHER): Payer: Medicare Other | Admitting: Cardiology

## 2010-09-20 ENCOUNTER — Ambulatory Visit: Payer: Self-pay | Admitting: Gastroenterology

## 2010-09-20 ENCOUNTER — Ambulatory Visit: Admit: 2010-09-20 | Payer: Self-pay | Admitting: Gastroenterology

## 2010-09-20 ENCOUNTER — Ambulatory Visit: Admit: 2010-09-20 | Payer: Self-pay | Admitting: Cardiology

## 2010-09-20 ENCOUNTER — Encounter: Payer: Self-pay | Admitting: Cardiology

## 2010-09-20 DIAGNOSIS — I2510 Atherosclerotic heart disease of native coronary artery without angina pectoris: Secondary | ICD-10-CM

## 2010-09-20 DIAGNOSIS — I6529 Occlusion and stenosis of unspecified carotid artery: Secondary | ICD-10-CM

## 2010-09-20 NOTE — Medication Information (Signed)
Summary: ccr-lr  Anticoagulant Therapy  Managed by: Vashti Hey, RN PCP: Wyvonnia Dusky MD: Dietrich Pates MD, Molly Maduro Indication 1: Atrial Fibrillation (ICD-427.31) Lab Used: [facility] [place] Site:  INR POC 2.2  Dietary changes: no    Health status changes: no    Bleeding/hemorrhagic complications: no    Recent/future hospitalizations: no    Any changes in medication regimen? no    Recent/future dental: no  Any missed doses?: no       Is patient compliant with meds? yes       Allergies: No Known Drug Allergies  Anticoagulation Management History:      The patient is taking warfarin and comes in today for a routine follow up visit.  Positive risk factors for bleeding include an age of 74 years or older.  The bleeding index is 'intermediate risk'.  Positive CHADS2 values include History of HTN.  Negative CHADS2 values include Age > 58 years old.  The start date was 08/22/2005.  Anticoagulation responsible provider: Dietrich Pates MD, Molly Maduro.  INR POC: 2.2.  Cuvette Lot#: [id number].  Exp: 10/11.    Anticoagulation Management Assessment/Plan:      The patient's current anticoagulation dose is Warfarin sodium 5 mg tabs: Use as directed by Anticoagulation Clinic.  The target INR is 2 - 3.  The next INR is due 09/19/2010.  Anticoagulation instructions were given to patient.  Results were reviewed/authorized by Vashti Hey, RN.  She was notified by Vashti Hey RN.         Prior Anticoagulation Instructions: INR 2.2 Continue coumadin 5mg  once daily   Current Anticoagulation Instructions: Same as Prior Instructions.

## 2010-09-26 NOTE — Miscellaneous (Signed)
  Clinical Lists Changes  Observations: Added new observation of PAST MED HX: 1. Atrial fibrillation CAD-status post bare-metal stent December 2006.. minor other coronary disease Coumadin therapy Degenerative arthritis of the right knee-may need surgery in the future Morbid obesity 3. Dyslipidemia 4. Hypertension EF  normal range... echo... suboptimal... September, 2008 Psoriasis  (09/19/2010 9:38) Added new observation of PRIMARY MD: Fusco (09/19/2010 9:38)       Past History:  Past Medical History: 1. Atrial fibrillation CAD-status post bare-metal stent December 2006.. minor other coronary disease Coumadin therapy Degenerative arthritis of the right knee-may need surgery in the future Morbid obesity 3. Dyslipidemia 4. Hypertension EF  normal range... echo... suboptimal... September, 2008 Psoriasis

## 2010-09-26 NOTE — Assessment & Plan Note (Signed)
Summary: ROV PER PT CALL/LG/AMD   Vital Signs:  Patient profile:   76 year old female Weight:      254 pounds Pulse rate:   75 / minute BP sitting:   122 / 66  (right arm)  Vitals Entered By: Hardin Negus, RMA (September 20, 2010 11:29 AM)  Visit Type:  Follow-up , cardiac clearance Primary Provider:  Elfredia Nevins, MD  CC:  CAD.  History of Present Illness: The patient is seen for followup of coronary artery disease.  She received a bare-metal stent in December, 2006.  I carefully reviewed her symptoms during the visit today.  She had some discomfort in the left arm it seemed to stop at the elbow.  There was a different discomfort that went from the elbow down to her thumb.  She is very specific about this.  There was no chest pain shortness of breath nausea vomiting.  She did receive a stent at that time.  She has not had any significant symptoms since.  She had an echo in 2008 there was suboptimal.  Ejection fraction was in the normal range.  She has very low cholesterol.  Patient also has atrial fibrillation.  Her rate has been controlled.   Patient has had some difficulty with her knees.  She is contemplating the possibility of knee surgery.  Cardiac clearance would be needed.  Current Medications (verified): 1)  Metoprolol Succinate 100 Mg Xr24h-Tab (Metoprolol Succinate) .... Take One Tablet By Mouth Daily 2)  Diovan 320 Mg Tabs (Valsartan) .... Take One Tablet By Mouth Daily 3)  Aspirin 81 Mg Tbec (Aspirin) .... Take One Tablet By Mouth Daily 4)  Furosemide 40 Mg Tabs (Furosemide) .[name].. 1 1/2 Tabs Once Daily 5)  Lipitor 80 Mg Tabs (Atorvastatin Calcium) .... Take One-Half Tablet By Mouth Daily. 6)  Klor-Con M20 20 Meq Cr-Tabs (Potassium Chloride Crys Cr) .[name].. 1 Tab By Mouth Once Daily 7)  Omeprazole 20 Mg Cpdr (Omeprazole) .... 2 Tabs Once Daily 8)  Nitroglycerin 0.4 Mg Subl (Nitroglycerin) .... One Tablet Under Tongue Every 5 Minutes As Needed For Chest Pain---May Repeat  Times Three 9)  Hydrocodone-Acetaminophen 5-500 Mg Tabs (Hydrocodone-Acetaminophen) .... 1/2 Tab Every 4 Hrs As Needed 10)  Fexofenadine Hcl 180 Mg Tabs (Fexofenadine Hcl) .... As Needed 11)  Pepcid 20 Mg Tabs (Famotidine) .... As Needed 12)  Coq10 50 Mg Caps (Coenzyme Q10) .... 2 Tabs Once Daily 13)  Vitamin D3 1000 Unit Tabs (Cholecalciferol) .... Take 1 Tablet By Mouth Once A Day 14)  Fish Oil   Oil (Fish Oil) .... 1000mg  Take One Tablet By Mouth Two Times A Day . 15)  Multivitamins   Tabs (Multiple Vitamin) .... Take One Tablet By Mouth Twice Daily. 16)  Alprazolam 0.5 Mg Tabs (Alprazolam) .[name].. 1 1/2 At Bedtime 17)  Warfarin Sodium 5 Mg Tabs (Warfarin Sodium) .... Use As Directed By Anticoagulation Clinic 18)  Biotin 1000 Mcg Tabs (Biotin) .... Take 1 Tablet By Mouth Once A Day  Allergies (verified): No Known Drug Allergies  Past History:  Past Medical History: 1. Atrial fibrillation CAD-status post bare-metal stent December 2006.. minor other coronary disease Coumadin therapy Degenerative arthritis of the right knee-may need surgery in the future Morbid obesity 3. Dyslipidemia 4. Hypertension EF  normal range... echo... suboptimal... September, 2008.[name] Psoriasis Carotid bruit  Review of Systems       Patient denies fever, chills, headache, sweats, rash, change in vision, change in hearing, chest pain, cough, nausea vomiting, urinary  symptoms.  All other systems are reviewed and are negative.  Physical Exam  General:  patient is significantly overweight.  She is stable. Head:  head is atraumatic. Eyes:  no xanthelasma. Neck:  no jugulovenous attention.  There is a soft left carotid bruit. Chest Wall:  chest wall is nontender. Lungs:  lungs are clear.  Respiratory effort is unlabored. Heart:  cardiac exam reveals S1-S2.  No clicks or significant murmurs. Abdomen:  abdomen soft. Msk:  no musculoskeletal deformities. Extremities:  trace peripheral edema.  She wears support  hose. Skin:  no skin rashes. Psych:  patient is oriented to person time and place her affect is normal.   Impression & Recommendations:  Problem # 1:  CAROTID BRUIT (ICD-785.9)  There is a soft carotid bruit.  The patient has known vascular disease.  I do not have any record of carotid Dopplers being done.  Carotid Doppler will be obtained.  Orders: Carotid Duplex (Carotid Duplex)  Problem # 2:  OVERWEIGHT (ICD-278.02) Weight loss certainly would be in order.  She is trying hard.  Problem # 3:  HYPERTENSION (ICD-401.9) Blood pressure is controlled.  No change in therapy.  Problem # 4:  DYSLIPIDEMIA (ICD-272.4)  Her updated medication list for this problem includes:    Lipitor 80 Mg Tabs (Atorvastatin calcium) .[name]... Take one-half tablet by mouth daily. The patient's lipids are superb.  Her meds will be continued.  Problem # 5:  CAD (ICD-414.00)  Coronary disease is stable.  It has been 6 years since her last study.  We need to do  a pharmacologic nuclear scan.  This will be scheduled.  Orders: Nuclear Stress Test (Nuc Stress Test)  Problem # 6:  ATRIAL FIBRILLATION, HX OF (ICD-V12.59) Atrial fibrillation controlled.  She is on Coumadin.  No change in therapy.  Problem # 7:  * CLEARANCE FOR KNEE SURGERY The patient may decide to have knee surgery.  She appears to be stable.  Her exercise tolerance level is limited because of knee discomfort.  She does walk with a cane.  The nuclear scan will  help assess her further as we do not have exercise for greater than 6 years.  If the nuclear scan showed no ischemia it would be safe for her to proceed with knee surgery.  Nuclear scan and carotid Doppler will be obtained.  I will  be in touch with her with information.  I will see her back in 6 months.  Patient Instructions: 1)  Your physician has requested that you have a carotid duplex. This test is an ultrasound of the carotid arteries in your neck. It looks at blood flow through  these arteries that supply the brain with blood. Allow one hour for this exam. There are no restrictions or special instructions. 2)  Your physician has requested that you have a Lexiscan myoview.  For further information please visit [email or web address]  Please follow instruction sheet, as given. 3)  Your physician wants you to follow-up in:  6 months.  You will receive a reminder letter in the mail two months in advance. If you don't receive a letter, please call our office to schedule the follow-up appointment.   Orders Added: 1)  Carotid Duplex [Carotid Duplex] 2)  Nuclear Stress Test [Nuc Stress Test]

## 2010-09-27 ENCOUNTER — Encounter (INDEPENDENT_AMBULATORY_CARE_PROVIDER_SITE_OTHER): Payer: Self-pay | Admitting: *Deleted

## 2010-09-28 ENCOUNTER — Telehealth (INDEPENDENT_AMBULATORY_CARE_PROVIDER_SITE_OTHER): Payer: Self-pay | Admitting: *Deleted

## 2010-10-02 ENCOUNTER — Encounter: Payer: Self-pay | Admitting: Cardiology

## 2010-10-02 ENCOUNTER — Encounter (INDEPENDENT_AMBULATORY_CARE_PROVIDER_SITE_OTHER): Payer: Medicare Other

## 2010-10-02 ENCOUNTER — Ambulatory Visit (HOSPITAL_COMMUNITY): Payer: Medicare Other | Attending: Cardiovascular Disease

## 2010-10-02 DIAGNOSIS — R0989 Other specified symptoms and signs involving the circulatory and respiratory systems: Secondary | ICD-10-CM

## 2010-10-02 DIAGNOSIS — I6529 Occlusion and stenosis of unspecified carotid artery: Secondary | ICD-10-CM

## 2010-10-03 ENCOUNTER — Encounter (INDEPENDENT_AMBULATORY_CARE_PROVIDER_SITE_OTHER): Payer: Self-pay | Admitting: *Deleted

## 2010-10-03 ENCOUNTER — Telehealth: Payer: Self-pay | Admitting: Cardiology

## 2010-10-04 ENCOUNTER — Encounter: Payer: Self-pay | Admitting: Cardiology

## 2010-10-04 ENCOUNTER — Encounter (INDEPENDENT_AMBULATORY_CARE_PROVIDER_SITE_OTHER): Payer: Medicare Other

## 2010-10-04 DIAGNOSIS — Z7901 Long term (current) use of anticoagulants: Secondary | ICD-10-CM

## 2010-10-04 DIAGNOSIS — I4891 Unspecified atrial fibrillation: Secondary | ICD-10-CM

## 2010-10-04 NOTE — Progress Notes (Signed)
Summary: Nuclear Pre-Procedure  Phone Note Outgoing Call Call back at [facility] Phone [phone]   Call placed by: Stanton Kidney, EMT-P,  September 28, 2010 12:10 PM Summary of Call: Unable to leave message with information on Myoview Information Sheet (see scanned document for details); no answer/no machine. Stanton Kidney, EMT-P  September 28, 2010 12:10 PM   Reviewed information on Myoview Information Sheet (see scanned document for further details).  Spoke with the patient. Stanton Kidney, EMT-P  September 28, 2010 3:03 PM     Nuclear Med Background Indications for Stress Test: Evaluation for Ischemia, Surgical Clearance, Stent Patency  Indications Comments: Pending Knee surgery  History: Echo, Heart Catheterization, Myocardial Infarction, Stents  History Comments: '06 NSTEMI > LAD Stent '08 Echo: NL     Nuclear Pre-Procedure Cardiac Risk Factors: Carotid Disease, Hypertension, Lipids Height (in): 63

## 2010-10-04 NOTE — Letter (Signed)
Summary: Appointment - Missed  [facility] [place]  [address] [place] [address]   Phone: [phone]  Fax: [phone]     September 27, 2010 MRN: [id number]   [facility] [address] [place], [place]  [address]   Dear Ms. Westgate,  Our records indicate you missed your appointment on   09/19/10                     with Coumadin clinic       .                                    It is very important that we reach you to reschedule this appointment. We look forward to participating in your health care needs. Please contact us at the number listed above at your earliest convenience to reschedule this appointment.     Sincerely,    Glass blower/designer

## 2010-10-05 ENCOUNTER — Other Ambulatory Visit: Payer: Self-pay | Admitting: Physician Assistant

## 2010-10-05 ENCOUNTER — Ambulatory Visit (INDEPENDENT_AMBULATORY_CARE_PROVIDER_SITE_OTHER): Payer: Medicare Other | Admitting: Physician Assistant

## 2010-10-05 ENCOUNTER — Encounter: Payer: Self-pay | Admitting: Physician Assistant

## 2010-10-05 DIAGNOSIS — I10 Essential (primary) hypertension: Secondary | ICD-10-CM

## 2010-10-05 DIAGNOSIS — Z8679 Personal history of other diseases of the circulatory system: Secondary | ICD-10-CM

## 2010-10-05 LAB — BASIC METABOLIC PANEL
CO2: 30 mEq/L (ref 19–32)
Chloride: 104 mEq/L (ref 96–112)
Potassium: 4.7 mEq/L (ref 3.5–5.1)
Sodium: 140 mEq/L (ref 135–145)

## 2010-10-09 ENCOUNTER — Encounter: Payer: Self-pay | Admitting: Physician Assistant

## 2010-10-10 ENCOUNTER — Telehealth (INDEPENDENT_AMBULATORY_CARE_PROVIDER_SITE_OTHER): Payer: Self-pay | Admitting: *Deleted

## 2010-10-10 NOTE — Miscellaneous (Signed)
Summary: Cardiology Myoview-rescheduled  Clinical Lists Changes     [name] was in for [place] on 10/02/10, but had to be r/s d/t hypotension.  She was given 300 cc of normal saline, but BP never elevated enough to start Lexiscan.  Patient also had some 2-second pauses with his SVR atrial fib.  Dr. Eden Emms, DOD, said to reschedule, have pt. decrease Metoprolol 100mg  to 1/2 dose, 50 mg a day and to f/u with Dr. Myrtis Ser ASAP.  She has been r/s for her myoview on Thursday, 10/11/10 @ 12:30 pm and is to hold her Metoprolol and Diovan prior to myoview.  She has an appt. with Tereso Newcomer, PA on Friday, 10/05/10.

## 2010-10-10 NOTE — Progress Notes (Signed)
Summary: pt has questions re taking metoprolol   Phone Note Call from Patient   Caller: Patient [phone] Reason for Call: Talk to Nurse Summary of Call: pt had to rs myoview due to bp-has questions re taking the metoprolol before Initial call taken by: Glynda Jaeger,  October 03, 2010 9:04 AM  Follow-up for Phone Call        per Sherri in nuc med pts BP was very low yest. systolic upper 70-80s, received fluid and per Dr Eden Emms pt was to decrease metoprolol to 1/2 tab (50mg ), spoke w/pt she was a little confused and just wanted to make sure she was suppose to cut metoprolol in half that is what she did last night but wanted to make sure, reassured pt, she is sch to see Lorin Picket on Fri Meredith Staggers, RN  October 03, 2010 10:05 AM

## 2010-10-10 NOTE — Medication Information (Signed)
Summary: CCR  Anticoagulant Therapy  Managed by: Vashti Hey, RN PCP: Elfredia Nevins, MD Supervising MD: Diona Browner MD, Remi Deter Indication 1: Atrial Fibrillation (ICD-427.31) Lab Used: [facility] Site: [place] INR POC 2.3  Dietary changes: no    Health status changes: no    Bleeding/hemorrhagic complications: no    Recent/future hospitalizations: no    Any changes in medication regimen? no    Recent/future dental: no  Any missed doses?: no       Is patient compliant with meds? yes       Allergies: No Known Drug Allergies  Anticoagulation Management History:      The patient is taking warfarin and comes in today for a routine follow up visit.  Positive risk factors for bleeding include an age of 74 years or older.  The bleeding index is 'intermediate risk'.  Positive CHADS2 values include History of HTN.  Negative CHADS2 values include Age > 28 years old.  The start date was 08/22/2005.  Anticoagulation responsible provider: Diona Browner MD, Remi Deter.  INR POC: 2.3.  Cuvette Lot#: [id number].  Exp: 10/11.    Anticoagulation Management Assessment/Plan:      The patient's current anticoagulation dose is Warfarin sodium 5 mg tabs: Use as directed by Anticoagulation Clinic.  The target INR is 2 - 3.  The next INR is due 11/01/2010.  Anticoagulation instructions were given to patient.  Results were reviewed/authorized by Vashti Hey, RN.  She was notified by Vashti Hey RN.         Prior Anticoagulation Instructions: INR 2.2 Continue coumadin 5mg  once daily   Current Anticoagulation Instructions: INR 2.3 Continue coumadin 5mg  once daily

## 2010-10-10 NOTE — Assessment & Plan Note (Signed)
Summary: Cancelled Myoview Study  Nuclear Med Background Indications for Stress Test: Evaluation for Ischemia, Surgical Clearance, Stent Patency  Indications Comments: Pending Knee surgery  History: Echo, Heart Catheterization, Myocardial Infarction, Stents  History Comments: '06 NSTEMI > LAD Stent '08 Echo: NL     Nuclear Pre-Procedure Cardiac Risk Factors: Carotid Disease, Hypertension, Lipids Caffeine/Decaff Intake: None NPO After: 9:00 AM IV 0.9% NS with Angio Cath: 20g     IV Site: R Antecubital IV Started by: Stanton Kidney, EMT-P Height (in): 63 Weight (lb): 256 BMI: 45.51 Tech Comments: Rescheduled d/t hypotensive response.

## 2010-10-10 NOTE — Assessment & Plan Note (Signed)
Summary: med change/was sched. for stress had to r/s pt is hypotension...  Medications Added METOPROLOL SUCCINATE 100 MG XR24H-TAB (METOPROLOL SUCCINATE) 1/2 by mouth daily FUROSEMIDE 40 MG TABS (FUROSEMIDE) 1 1/2 tabs every other day      Allergies Added: NKDA  Primary Provider:  Elfredia Nevins, MD   History of Present Illness: Primary Cardiologist:  Dr. Zackery Barefoot  [name] is a 76 yo female with CAD, s/p BMS in 2006, AFib on coumadin, HTN and HLP.  She saw Dr. Myrtis Ser recently with some arm symptoms.  She was also contemplating knee surgery and needs clearance.  She also had a carotid bruit on exam.  She was set up for a pharmacologic Myoview study.  She also had carotid Dopplers performed.  Her Myoview study had to be canceled due to hypotension.  She was somewhat symptomatic and received IV fluids.  Her medications were adjusted.  She was brought back today for followup prior to rescheduling her test.  She is doing well.  Her BP has been taken twice since earlier this week and it has been ok.  She denies weakness, fatigue, near syncope.  She denies chest pain, shortness of breath.  She denies fevers, chills, nausea, vomiting.  She denies melena, hematochezia, hematuria.  Current Medications (verified): 1)  Metoprolol Succinate 100 Mg Xr24h-Tab (Metoprolol Succinate) .... 1/2 By Mouth Daily 2)  Diovan 320 Mg Tabs (Valsartan) .... Take One Tablet By Mouth Daily 3)  Aspirin 81 Mg Tbec (Aspirin) .... Take One Tablet By Mouth Daily 4)  Furosemide 40 Mg Tabs (Furosemide) .[name].. 1 1/2 Tabs Every Other Day 5)  Lipitor 80 Mg Tabs (Atorvastatin Calcium) .... Take One-Half Tablet By Mouth Daily. 6)  Klor-Con M20 20 Meq Cr-Tabs (Potassium Chloride Crys Cr) .[name].. 1 Tab By Mouth Once Daily 7)  Omeprazole 20 Mg Cpdr (Omeprazole) .... 2 Tabs Once Daily 8)  Nitroglycerin 0.4 Mg Subl (Nitroglycerin) .... One Tablet Under Tongue Every 5 Minutes As Needed For Chest Pain---May Repeat Times Three 9)   Hydrocodone-Acetaminophen 5-500 Mg Tabs (Hydrocodone-Acetaminophen) .... 1/2 Tab Every 4 Hrs As Needed 10)  Fexofenadine Hcl 180 Mg Tabs (Fexofenadine Hcl) .... As Needed 11)  Pepcid 20 Mg Tabs (Famotidine) .... As Needed 12)  Coq10 50 Mg Caps (Coenzyme Q10) .... 2 Tabs Once Daily 13)  Vitamin D3 1000 Unit Tabs (Cholecalciferol) .... Take 1 Tablet By Mouth Once A Day 14)  Fish Oil   Oil (Fish Oil) .... 1000mg  Take One Tablet By Mouth Two Times A Day . 15)  Multivitamins   Tabs (Multiple Vitamin) .... Take One Tablet By Mouth Twice Daily. 16)  Alprazolam 0.5 Mg Tabs (Alprazolam) .[name].. 1 1/2 At Bedtime 17)  Warfarin Sodium 5 Mg Tabs (Warfarin Sodium) .... Use As Directed By Anticoagulation Clinic 18)  Biotin 1000 Mcg Tabs (Biotin) .... Take 1 Tablet By Mouth Once A Day  Allergies (verified): No Known Drug Allergies  Past History:  Past Medical History: Last updated: 09/20/2010 1. Atrial fibrillation CAD-status post bare-metal stent December 2006.. minor other coronary disease Coumadin therapy Degenerative arthritis of the right knee-may need surgery in the future Morbid obesity 3. Dyslipidemia 4. Hypertension EF  normal range... echo... suboptimal... September, 2008.[name] Psoriasis Carotid bruit  Review of Systems       As per  the HPI.  All other systems reviewed and negative.   Vital Signs:  Patient profile:   76 year old female Height:      63 inches Weight:  262 pounds BMI:     46.58 Pulse rate:   68 / minute Resp:     18 per minute BP sitting:   132 / 86  (right arm)  Vitals Entered By: Marrion Coy, CNA (October 05, 2010 11:25 AM)  Physical Exam  General:  Well nourished, well developed, in no acute distress HEENT: normal Neck: no JVD Cardiac:  normal S1, S2; irregularly irregular Lungs:  clear to auscultation bilaterally, no wheezing, rhonchi or rales Abd: soft, nontender, no hepatomegaly Ext: 1+ bilat edema Skin: warm and dry Neuro:  CNs 2-12 intact, no  focal abnormalities noted    Impression & Recommendations:  Problem # 1:  HYPERTENSION (ICD-401.9) Her metoprolol was decreased to 50 mg a day.  Her blood pressure looks much better today and has over the last several days.  She denies any recent illnesses that would explain dehydration.  I will check a basic metabolic panel today.  She does not appear anemic.  She denies any symptoms of bleeding.  She has been rescheduled for her Myoview.  She's been advised to hold her blood pressure medicines the night before. I also told her to hold her furosemide if she is due for that the day before her test.  Orders: TLB-BMP (Basic Metabolic Panel-BMET) (80048-METABOL)  Problem # 2:  CAROTID BRUIT (ICD-785.9) Carotid Dopplers were normal.  Patient Instructions: 1)  Your physician recommends that you schedule a follow-up appointment in: Keep scheduled appt for myoview on October 11, 2010 2)  Your physician recommends that you continue on your current medications as directed. Please refer to the Current Medication list given to you today.

## 2010-10-11 ENCOUNTER — Encounter: Payer: Self-pay | Admitting: Internal Medicine

## 2010-10-11 ENCOUNTER — Ambulatory Visit (HOSPITAL_COMMUNITY): Payer: Medicare Other | Attending: Cardiology

## 2010-10-11 DIAGNOSIS — I2510 Atherosclerotic heart disease of native coronary artery without angina pectoris: Secondary | ICD-10-CM | POA: Insufficient documentation

## 2010-10-11 DIAGNOSIS — E785 Hyperlipidemia, unspecified: Secondary | ICD-10-CM | POA: Insufficient documentation

## 2010-10-11 DIAGNOSIS — E663 Overweight: Secondary | ICD-10-CM | POA: Insufficient documentation

## 2010-10-11 DIAGNOSIS — I10 Essential (primary) hypertension: Secondary | ICD-10-CM | POA: Insufficient documentation

## 2010-10-12 ENCOUNTER — Telehealth: Payer: Self-pay | Admitting: Cardiology

## 2010-10-15 ENCOUNTER — Encounter: Payer: Self-pay | Admitting: Internal Medicine

## 2010-10-15 ENCOUNTER — Ambulatory Visit (HOSPITAL_COMMUNITY): Payer: Medicare Other

## 2010-10-15 DIAGNOSIS — R0989 Other specified symptoms and signs involving the circulatory and respiratory systems: Secondary | ICD-10-CM

## 2010-10-16 NOTE — Progress Notes (Signed)
Summary: Nuc Pre-Procedure  Phone Note Outgoing Call Call back at [facility] Phone [phone]   Call placed by: Antionette Char RN,  October 10, 2010 2:15 PM Summary of Call: Reviewed information on Myoview Information Sheet (see scanned document for further details).  Spoke with patient.     Nuclear Med Background Indications for Stress Test: Evaluation for Ischemia, Surgical Clearance, Stent Patency  Indications Comments: Pending Knee surgery  History: Echo, Heart Catheterization, Myocardial Infarction, Stents  History Comments: '06 NSTEMI > LAD Stent '08 Echo: NL     Nuclear Pre-Procedure Cardiac Risk Factors: Carotid Disease, Hypertension, Lipids Height (in): 63

## 2010-10-16 NOTE — Miscellaneous (Signed)
Summary: medication changes  Clinical Lists Changes  Medications: Changed medication from FUROSEMIDE 40 MG TABS (FUROSEMIDE) 1 1/2 tabs every other day to FUROSEMIDE 40 MG TABS (FUROSEMIDE) 1 tab every other day x 1 week....THEN increase to 1 1/2 tab every other day as per Tereso Newcomer, PA-C

## 2010-10-16 NOTE — Progress Notes (Signed)
Summary: refill request   Phone Note Refill Request Message from:  Patient on October 12, 2010 2:05 PM  Refills Requested: Medication #1:  LIPITOR 80 MG TABS Take one-half tablet by mouth daily. [facility]   Method Requested: Telephone to Pharmacy Initial call taken by: Glynda Jaeger,  October 12, 2010 2:05 PM    Prescriptions: LIPITOR 80 MG TABS (ATORVASTATIN CALCIUM) Take one-half tablet by mouth daily.  #30 Tablet x 5   Entered by:   Hardin Negus, RMA   Authorized by:   Talitha Givens, MD, [facility]   Signed by:   Hardin Negus, RMA on 10/12/2010   Method used:   Electronically to        [facility]* (retail)       [address]       [place], [place]  [address]       Ph: [phone]       Fax: [phone]   RxID:   [id number]

## 2010-10-23 ENCOUNTER — Other Ambulatory Visit: Payer: Medicare Other

## 2010-10-23 ENCOUNTER — Other Ambulatory Visit: Payer: Self-pay | Admitting: Cardiology

## 2010-10-23 ENCOUNTER — Encounter: Payer: Self-pay | Admitting: Cardiology

## 2010-10-23 ENCOUNTER — Other Ambulatory Visit (INDEPENDENT_AMBULATORY_CARE_PROVIDER_SITE_OTHER): Payer: Medicare Other

## 2010-10-23 DIAGNOSIS — I10 Essential (primary) hypertension: Secondary | ICD-10-CM

## 2010-10-23 LAB — BASIC METABOLIC PANEL
CO2: 34 mEq/L — ABNORMAL HIGH (ref 19–32)
Chloride: 103 mEq/L (ref 96–112)
GFR: 59.74 mL/min — ABNORMAL LOW (ref >60.00)
Glucose, Bld: 106 mg/dL — ABNORMAL HIGH (ref 70–99)
Potassium: 4.1 mEq/L (ref 3.5–5.1)
Sodium: 142 mEq/L (ref 135–145)

## 2010-10-25 ENCOUNTER — Ambulatory Visit (HOSPITAL_COMMUNITY): Payer: Medicare Other | Attending: Cardiology

## 2010-10-25 DIAGNOSIS — I079 Rheumatic tricuspid valve disease, unspecified: Secondary | ICD-10-CM | POA: Insufficient documentation

## 2010-10-25 DIAGNOSIS — I4891 Unspecified atrial fibrillation: Secondary | ICD-10-CM | POA: Insufficient documentation

## 2010-10-25 DIAGNOSIS — I10 Essential (primary) hypertension: Secondary | ICD-10-CM | POA: Insufficient documentation

## 2010-10-25 DIAGNOSIS — E785 Hyperlipidemia, unspecified: Secondary | ICD-10-CM | POA: Insufficient documentation

## 2010-10-25 DIAGNOSIS — I059 Rheumatic mitral valve disease, unspecified: Secondary | ICD-10-CM | POA: Insufficient documentation

## 2010-10-25 DIAGNOSIS — I2510 Atherosclerotic heart disease of native coronary artery without angina pectoris: Secondary | ICD-10-CM

## 2010-10-25 NOTE — Consult Note (Signed)
Summary: Consultation Report  Consultation Report   Imported By: Earl Many 10/17/2010 17:10:34  _____________________________________________________________________  External Attachment:    Type:   Image     Comment:   External Document

## 2010-10-25 NOTE — Assessment & Plan Note (Addendum)
Summary: Cardiology Nuclear Testing  Nuclear Med Background Indications for Stress Test: Evaluation for Ischemia, Surgical Clearance, Stent Patency  Indications Comments: Pending Knee surgery  History: Echo, Heart Catheterization, Myocardial Infarction, Stents  History Comments: '06 NSTEMI > LAD Stent '08 Echo: NL   Symptoms Comments: Lt. arm Discomfort- patient associates with weather   Nuclear Pre-Procedure Cardiac Risk Factors: Carotid Disease, Hypertension, Lipids Caffeine/Decaff Intake: none NPO After: 9:00 AM Lungs: clear IV 0.9% NS with Angio Cath: 20g     IV Site: R Antecubital IV Started by: Stanton Kidney, EMT-P Chest Size (in) 48     Cup Size C     Height (in): 63 Weight (lb): 256 BMI: 45.51 Tech Comments: Metoprolol held this day, per patient.  Nuclear Med Study 1 or 2 day study:  2 day     Stress Test Type:  Lexiscan Reading MD:  Dietrich Pates, MD     Referring MD:  Dr. Willa Rough Resting Radionuclide:  Technetium 51m Tetrofosmin     Resting Radionuclide Dose:  33 mCi  Stress Radionuclide:  Technetium 39m Tetrofosmin     Stress Radionuclide Dose:  33 mCi   Stress Protocol      Max HR:  85 bpm     Predicted Max HR:  147 bpm  Max Systolic BP: 118 mm Hg     Percent Max HR:  57.82 %Rate Pressure Product:  [address]  Lexiscan: 0.4 mg   Stress Test Technologist:  Bonnita Levan, RN     Nuclear Technologist:  Doyne Keel, CNMT  Rest Procedure  Myocardial perfusion imaging was performed at rest 45 minutes following the intravenous administration of Technetium 21m Tetrofosmin.  Stress Procedure  The patient received IV Lexiscan 0.4 mg over 15-seconds.  Technetium 32m Tetrofosmin injected at 30-seconds.  She c/o Chest Pressure with injection, which resolved in recovery. There were no significant changes with infusion.  Quantitative spect images were obtained after a 45 minute delay.  QPS Raw Data Images:  soft tissue (diaphragm, bowel activity, breast)surround heart. Stress  Images:  Decreased counts in the inferolateral wall (base, mid, minimally distal) and apex.  Otherwise normal perfusion. Rest Images:  incomplete improvement in the inferolateral wall  Subtraction (SDS):  Mild ischemia. Transient Ischemic Dilatation:  0.82  (Normal <1.22)  Lung/Heart Ratio:  0.37  (Normal <0.45)  Quantitative Gated Spect Images QGS cine images:  Images were not gated.   Overall Impression  Exercise Capacity: Lexiscan with no exercise. BP Response: Normal blood pressure response. Clinical Symptoms: Moderate chest pressure. ECG Impression: No significant ST segment change suggestive of ischemia. Overall Impression Comments: Inferolateral defect that improves incompletely consistent with scar with periinfarct ischemia.  Cannot exclude coexistent soft tissue attenuation.  Normal perfusion elsewhere  Images not gated.  Appended Document: Cardiology Nuclear Testing inferolat scar with peri-infarct ischemia (cannot exclude soft tissue atten) no indication on available studies she has had this in the past  please get echo done (?this week) to look at wall motion will leave for Dr. Myrtis Ser to review as well  make sure she has follow up in next 1-2 weeks  Appended Document: Cardiology Nuclear Testing pt is aware, will discuss w/echo tech tom to see when we can get echo sch   Clinical Lists Changes  Orders: Added new Referral order of Echocardiogram (Echo) - Signed

## 2010-11-01 ENCOUNTER — Encounter (INDEPENDENT_AMBULATORY_CARE_PROVIDER_SITE_OTHER): Payer: Medicare Other

## 2010-11-01 ENCOUNTER — Encounter: Payer: Self-pay | Admitting: Cardiology

## 2010-11-01 DIAGNOSIS — Z7901 Long term (current) use of anticoagulants: Secondary | ICD-10-CM

## 2010-11-01 DIAGNOSIS — I4891 Unspecified atrial fibrillation: Secondary | ICD-10-CM

## 2010-11-05 ENCOUNTER — Telehealth: Payer: Self-pay | Admitting: Cardiology

## 2010-11-06 NOTE — Medication Information (Signed)
Summary: ccr-lr  Anticoagulant Therapy  Managed by: Vashti Hey, RN Referring MD: Dr. Willa Rough PCP: Elfredia Nevins, MD Supervising MD: Dietrich Pates MD, Molly Maduro Indication 1: Atrial Fibrillation (ICD-427.31) Lab Used: [facility] [place] Site: [place] INR POC 2.4  Dietary changes: no    Health status changes: no    Bleeding/hemorrhagic complications: no    Recent/future hospitalizations: no    Any changes in medication regimen? no    Recent/future dental: no  Any missed doses?: no       Is patient compliant with meds? yes       Allergies: No Known Drug Allergies  Anticoagulation Management History:      The patient is taking warfarin and comes in today for a routine follow up visit.  Positive risk factors for bleeding include an age of 74 years or older.  The bleeding index is 'intermediate risk'.  Positive CHADS2 values include History of HTN.  Negative CHADS2 values include Age > 49 years old.  The start date was 08/22/2005.  Anticoagulation responsible provider: Dietrich Pates MD, Molly Maduro.  INR POC: 2.4.  Cuvette Lot#: [id number].  Exp: 10/11.    Anticoagulation Management Assessment/Plan:      The patient's current anticoagulation dose is Warfarin sodium 5 mg tabs: Use as directed by Anticoagulation Clinic.  The target INR is 2 - 3.  The next INR is due 11/29/2010.  Anticoagulation instructions were given to patient.  Results were reviewed/authorized by Vashti Hey, RN.  She was notified by Vashti Hey RN.         Prior Anticoagulation Instructions: INR 2.3 Continue coumadin 5mg  once daily   Current Anticoagulation Instructions: INR 2.4 Continue coumadin 5mg  once daily

## 2010-11-15 NOTE — Progress Notes (Signed)
Summary: echo reults   Phone Note Call from Patient Call back at Home Phone [phone]   Caller: Patient Reason for Call: Talk to Nurse, Lab or Test Results Summary of Call: pt calling re echo resultsl. Initial call taken by: Roe Coombs,  November 05, 2010 12:39 PM  Follow-up for Phone Call        pt aware of results Meredith Staggers, RN  November 05, 2010 3:24 PM

## 2010-11-16 ENCOUNTER — Encounter: Payer: Self-pay | Admitting: Cardiology

## 2010-11-16 DIAGNOSIS — Z7901 Long term (current) use of anticoagulants: Secondary | ICD-10-CM

## 2010-11-16 DIAGNOSIS — I4891 Unspecified atrial fibrillation: Secondary | ICD-10-CM | POA: Insufficient documentation

## 2010-11-16 DIAGNOSIS — Z8679 Personal history of other diseases of the circulatory system: Secondary | ICD-10-CM

## 2010-11-19 ENCOUNTER — Telehealth: Payer: Self-pay | Admitting: Cardiology

## 2010-11-19 NOTE — Telephone Encounter (Signed)
Pt gwetting cortizone shot at 1045a today-calling this am to find out if that's ok-pls call asap has to leave at 1015a

## 2010-11-20 NOTE — Telephone Encounter (Signed)
Spoke w/pt advised ok for shot

## 2010-11-27 ENCOUNTER — Other Ambulatory Visit: Payer: Self-pay | Admitting: *Deleted

## 2010-11-27 MED ORDER — VALSARTAN 320 MG PO TABS: 320.0000 mg | ORAL_TABLET | Freq: Every day | ORAL | Status: DC

## 2010-11-29 ENCOUNTER — Encounter: Payer: Medicare Other | Admitting: *Deleted

## 2010-12-03 ENCOUNTER — Ambulatory Visit (INDEPENDENT_AMBULATORY_CARE_PROVIDER_SITE_OTHER): Payer: Medicare Other | Admitting: *Deleted

## 2010-12-03 DIAGNOSIS — Z8679 Personal history of other diseases of the circulatory system: Secondary | ICD-10-CM

## 2010-12-03 DIAGNOSIS — I4891 Unspecified atrial fibrillation: Secondary | ICD-10-CM

## 2010-12-03 DIAGNOSIS — Z7901 Long term (current) use of anticoagulants: Secondary | ICD-10-CM

## 2010-12-31 ENCOUNTER — Encounter: Payer: Medicare Other | Admitting: *Deleted

## 2010-12-31 ENCOUNTER — Encounter: Payer: Self-pay | Admitting: *Deleted

## 2011-01-01 NOTE — Assessment & Plan Note (Signed)
[facility]                            CARDIOLOGY OFFICE NOTE   NAME:[name], [name]                  MRN:          [id number]  DATE:10/14/2008                            DOB:          [birth year]    [name] is seen for Cardiology followup and to move her care from  the [place] office to the [place] office.  She has been very happy  with everyone's care.  I take care for husband and she wants to see me  for her Cardiology followup.   The patient does have coronary artery disease.  She also has atrial  fibrillation.  She is on Coumadin.  She has a history of hypertension.  She is not having any significant chest pain.  She does have a problem  with her knee, which is longstanding.  For now she is ambulating  adequately.  There had been consideration of knee surgery, but she and  Dr. Nobie Putnam decided to follow this over time.   PAST MEDICAL HISTORY:  Allergies:  No known drug allergies.   MEDICATIONS:  See the flow sheet.   REVIEW OF SYSTEMS:  She is not having any GI or GU symptoms.  She has no  fevers or chills.  There are no headache.  She has no skin rashes.  She  does have difficulty with her right knee, but she is ambulating  adequately.  Otherwise, review of systems is negative.   PHYSICAL EXAMINATION:  VITAL SIGNS:  The patient is significantly  overweight.  She weighs 274 pounds.  Blood pressure is 122/80 with a  pulse of 55.  GENERAL:  The patient is oriented to person, time, and place.  Affect is  normal.  HEENT:  No xanthelasma.  She has normal extraocular motion.  NECK:  There are no carotid bruits.  There is no jugular venous  distention.  LUNGS:  Clear.  Respiratory effort is not labored.  CARDIAC:  S1 and S2.  There are no clicks or significant murmur.  ABDOMEN:  Soft.  She is obese.  EXTREMITIES:  She is wearing support hose.  There is no significant  edema at this time.   EKG reveals atrial fibrillation with a  controlled ventricular response.   PROBLEMS:  1. Degenerative disease of the knee.  Her coronary status has been      stable.  If over time she has worsening knee symptoms, she could be      a candidate for knee surgery.  2. Status post cholecystectomy in the past.  3. Atrial fibrillation.  Her rate is controlled.  Further assessment      is not needed.  She is on Coumadin.  4. Coumadin therapy.  This is managed carefully in the [place]      office.  5. Dyslipidemia.  She has good control on her current medication.  6. Hypertension.  There is good blood pressure control.  7. History of coronary artery disease.  The patient received a bare-      metal stent in December 2006.  This was to the left anterior      descending.  There was minor disease to the right and no      significant circumflex disease at that time.  She had moderate size      ramus intermedius that also showed no major abnormalities.  8. Good left ventricular function.  9. Morbid obesity.  10.History of psoriasis.   I am not changing her meds at this time.  Her overall cardiac status is  stable.  I discussed with the patient and her husband the fact that I  agree with following her knee over time.  However, if she becomes  debilitated, surgery could be considered.     Luis Abed, MD, [facility]  Electronically Signed    JDK/MedQ  DD: 10/14/2008  DT: 10/15/2008  Job #: [id number]   cc:   [name], M.D.

## 2011-01-01 NOTE — Assessment & Plan Note (Signed)
[facility]                       [place] CARDIOLOGY OFFICE NOTE   NAME:MCDONALDTerrisha, Lopata                  MRN:          [id number]  DATE:01/29/2007                            DOB:          12-28-1934    IDENTIFYING INFORMATION/JUSTIFICATION FOR ADMISSION AND CARE:  The  patient is a 76 year old woman with a history of CAD. She was last seen  in cardiology clinic back in June of last year by Dr. Dionicio Stall. She  is status post PTCA stent, Matt Holmes metal, to the LAD. Since seen, she has  been doing okay from a cardiac standpoint without chest pain. No change  or shortness of breath. She is walking with a shopping cart around her  pool and said she tolerates that well. Note, a couple months ago,  she  went down on her Lipitor to 40.   CURRENT MEDICATIONS:  Klor-Con 20, Lasix 40, Toprol XL 100, Diovan 320,  Lipitor 40, aspirin 81, Coumadin as directed, and Prilosec.   PHYSICAL EXAMINATION:  GENERAL:  The patient is an obese 76 year old in  no acute distress.  VITAL SIGNS:  Blood pressure 110/66, pulse 60 and regular. Weight 291.  NECK:  JVP is normal. No bruits.  LUNGS:  Clear.  CARDIAC:  Regular rate and rhythm. S1 and S2. No S3, no murmurs.  ABDOMEN:  Benign and obese.  EXTREMITIES:  Lipedema noted.   DIAGNOSTIC STUDIES:  12 lead EKG, sinus bradycardiac, 55 beats per  minute. Her first AV block was PR interval of 214 milliseconds.   IMPRESSION:  1. Coronary artery disease. It appears to be asymptomatic. Would      continue.  2. Dyslipidemia. Will check lipids in June and July, since she has      backed down on the Lipitor.   RECOMMENDATIONS:  Otherwise, I encouraged her to stay as active as she  can. Decrease her weight some. I would like to see her back in about one  year's time.     Pricilla Riffle, MD, [facility]  Electronically Signed    PVR/MedQ  DD: 01/29/2007  DT: 01/29/2007  Job #: [id number]

## 2011-01-01 NOTE — Assessment & Plan Note (Signed)
[facility]                       [place] CARDIOLOGY OFFICE NOTE   NAME:MCDONALDAnnais, [name]                  MRN:          [id number]  DATE:12/18/2007                            DOB:          1935/05/30    IDENTIFICATION:  [name] comes in today for continued care.  She  was last seen in cardiology clinic back in September.  She has a history  of CAD (status post PTCA/stent to the LAD back in 2006).  She also has a  history of intermittent atrial fibrillation.   The patient called in earlier this week.  She was anxious because she  went to the orthopedic physician, and he said her pulse was irregular.   On talking to her, she denies shortness of breath.  No chest pain, no  palpitations.   She is not too active because of her knee which is being evaluated for  possible surgery.   NOTE:  Her angina equivalent prior to angioplasty was just mild arm  discomfort on the left at the elbow.   CURRENT MEDICINES:  1. Toprol XL 100.  2. Diovan 320.  3. Aspirin 81.  4. Coumadin as directed.  5. Prilosec.  6. Lasix 40 b.i.d.  7. Lipitor 80.  8. Klor-Con 20 mEq.   PHYSICAL EXAM:  The patient is in no distress.  Blood pressure is 116/72, pulse is 53 and regular.  Weight 288 which is  down 3 pounds from previous.  LUNGS:  Clear.  NECK:  JVP is normal.  CARDIAC:  Irregularly irregular, S1-S2, no S3, no murmurs.  ABDOMEN:  Benign.   12-lead EKG shows atrial fibrillation with a ventricular rate of 53  beats per minute.   IMPRESSION:  1. Atrial fibrillation.  She is known to have this on Coumadin.  I      will go ahead and check a BMET and a TSH.  I will also set her up      for a Holter monitor.  Continue on current medicines for now.  2. History of coronary artery disease.  I am not convinced there are      any active symptoms.  Note, she is planning knee surgery, and she      is not active at all, had really minimal symptoms before a  stent.      I will therefore set her up for an adenosine Myoview.  3. Dyslipidemia.  Go ahead and check fasting lipids with an AST.      Continue medicines for now.  4. Hypertension, adequate control.   I will set to see the patient back next winter, sooner if problems  develop.  I will be in touch with her with test results.     Pricilla Riffle, MD, [facility]  Electronically Signed    PVR/MedQ  DD: 12/18/2007  DT: 12/18/2007  Job #: [id number]

## 2011-01-01 NOTE — Assessment & Plan Note (Signed)
[facility]                       [place] CARDIOLOGY OFFICE NOTE   NAME:MCDONALDVani, Gunner                  MRN:          [id number]  DATE:05/08/2007                            DOB:          [birth year]    IDENTIFICATION:  The patient is a 77 year old woman with a history of  CAD (status post PTCA/stent to the LAD back in 2006).  I last saw her in  clinic back in June.   The patient comes in today because early in September she began having  ankle swelling.  She was seen by Dr. Sherwood Gambler and her Lasix was increased  to 3 tablets per day along with 3 tablets of potassium.  She is now down  to 2 tablets of each per day and this has improved.  She says a couple  of weeks before that she began eating more regularly at the [facility]  She says she was eating, she thought, the healthier pizza but wonders if  it has more salt.  She denies chest pain, no change in her breathing, no  change in her activity level, ankles, improved.  No dizziness.   CURRENT MEDICATIONS:  1. Toprol XL 100.  2. Diovan 320 daily.  3. Aspirin 81 mg daily.  4. Coumadin as directed (for atrial fibrillation).  5. Prilosec.  6. Klor-Con 20 b.i.d.  7. Lasix 40 b.i.d.  8. Lipitor 40 daily.   PHYSICAL EXAMINATION:  GENERAL:  The patient is in no distress.  VITAL SIGNS:  Blood pressure is 124/72, pulse is 62, weight 291 stable.  NECK:  Difficult to assess JVP.  LUNGS:  Clear.  No rales.  CARDIAC:  Regular rate and rhythm.  S1 S2.  Normal S3 or S4.  ABDOMEN:  Obese, benign.  EXTREMITIES:  Some ruddiness to the skin.  No edema.   IMPRESSION:  1. Edema, improved, may indeed have just been from the dietary, would      schedule an echo, would also schedule B-MET, BNP.  I will be in      touch with her but keep the same medicines for now.  2. Coronary artery disease.  Again, I am not convinced there is      anything unstable.  She asked today, she wants to know if there is      any  more plaque buildup in her arteries.  She is on lipid-lowering      therapy with good control, excellent control LDL of 44.  I am not      sure unless the echo's pumping function is down that I would      schedule any further testing for now.  3. Dyslipidemia.  Lipids great as noted.  4. History of atrial fibrillation on Coumadin, followed here.   Tentative followup in the winter.  Again, we will be in touch with her  with results.     Pricilla Riffle, MD, [facility]  Electronically Signed    PVR/MedQ  DD: 05/08/2007  DT: 05/08/2007  Job #: [id number]   cc:   Madelin Rear. Sherwood Gambler, MD

## 2011-01-02 ENCOUNTER — Ambulatory Visit (INDEPENDENT_AMBULATORY_CARE_PROVIDER_SITE_OTHER): Payer: Medicare Other | Admitting: *Deleted

## 2011-01-02 DIAGNOSIS — Z8679 Personal history of other diseases of the circulatory system: Secondary | ICD-10-CM

## 2011-01-02 DIAGNOSIS — I4891 Unspecified atrial fibrillation: Secondary | ICD-10-CM

## 2011-01-02 DIAGNOSIS — Z7901 Long term (current) use of anticoagulants: Secondary | ICD-10-CM

## 2011-01-04 NOTE — Op Note (Signed)
NAME:  [name], [name]                     ACCOUNT NO.:  [id number]   MEDICAL RECORD NO.:  [id number]                   PATIENT TYPE:  AMB   LOCATION:  DAY                                  FACILITY:  [facility]   PHYSICIAN:  Lionel December, M.D.                 DATE OF BIRTH:  May 01, 1935   DATE OF PROCEDURE:  09/28/2003  DATE OF DISCHARGE:                                 OPERATIVE REPORT   PROCEDURE:  Esophagogastroduodenoscopy followed by total colonoscopy.   INDICATIONS FOR PROCEDURE:  [place] is a 76 year old Caucasian female who  has symptoms of chronic GERD not well-controlled with therapy.  She is also  undergoing colonoscopy both for diagnostic as well as screening purposes.  She has chronic/intermittent diarrhea possible due to IBS.  The procedure  risks were reviewed with the patient, and informed consent for the procedure  was obtained.   PREOPERATIVE MEDICATIONS:  Cetacaine spray for pharyngeal topical  anesthesia, Demerol 50 mg IV, Versed 5 mg IV in divided dose.   FINDINGS:  The procedures were performed in the endoscopy suite.  The  patient's vital signs and O2 saturations were monitored during the procedure  and remained stable.   PROCEDURE #1, ESOPHAGOGASTRODUODENOSCOPY:  The patient was placed in the  left lateral recumbent position, and the Olympus videoscope was passed via  the oropharynx without any difficulty into the esophagus.   Esophagus:  The mucosa of the esophagus was normal throughout.  The  squamocolumnar junction was unremarkable.   Stomach:  It was empty and distended very well with insufflation.  The folds  of the proximal stomach were normal.  Examination of the mucosa at gastric  body, antrum, pyloric channel, as well as angularis, fundus, and cardia were  normal.   Duodenum:  Examination of the bulb and postbulbar duodenum was also normal.  The endoscope was withdrawn and the patient prepared for procedure #2.   PROCEDURE #2, TOTAL  COLONOSCOPY:  Rectal examination was performed.  She had  large soft sentinel skin tags.  Digital exam was normal.  The Olympus  videoscope was placed into the rectum and advanced into the region of the  sigmoid colon and beyond.  The preparation was excellent.  The scope was  passed to the cecum which was identified by the appendiceal orifice and  ileocecal valve.  Pictures were taken for the record.  As the scope was  withdrawn, the colonic mucosa was carefully examined.  There were two small  polyps in the distal transverse colon which were ablated by cold biopsy.  The mucosa of the rest of the colon was normal.  The rectal mucosa similarly  was normal.  The scope was retroflexed to examine the anorectal junction,  and small hemorrhoids were noted above and below the dentate line.  The  endoscope was straightened and withdrawn.  The patient tolerated the  procedure well.   FINAL DIAGNOSES:  1. Normal esophagogastroduodenoscopy.  No evidence of complicated     gastroesophageal reflux disease.  2. Two small polyps ablated by cold biopsy from the transverse colon.  3. Small internal and external hemorrhoids.   RECOMMENDATIONS:  1. She will continue antireflux measures and Nexium at 40 mg p.o. q.a.m.     This seems to be working better than the other PPIs.  2. Citrucel one tablespoon full daily.  3. I will be contacting the patient with the biopsy results and further     recommendations.      ___________________________________________                                            Lionel December, M.D.   NR/MEDQ  D:  09/28/2003  T:  09/28/2003  Job:  [id number]   cc:   [name], M.D.  [address], [address]  [place]  [place] [address]  Fax: [phone]

## 2011-01-04 NOTE — H&P (Signed)
[name], [name]           ACCOUNT NO.:  [id number]   MEDICAL RECORD NO.:  [id number]          PATIENT TYPE:  INP   LOCATION:  1846                         FACILITY:  [facility]   PHYSICIAN:  Rollene Rotunda, M.D.   DATE OF BIRTH:  10/26/1934   DATE OF ADMISSION:  08/02/2005  DATE OF DISCHARGE:                                HISTORY & PHYSICAL   PRIMARY CARE PHYSICIAN:  Dr. [name].   CARDIOLOGIST:  Dr. Vida Roller.   CHIEF COMPLAINT:  Breast pain, left side.   HISTORY OF PRESENT ILLNESS:  The patient had been recently discharged from  [facility] on July 27, 2005 where she had a bare metal stent  placed in her LAD. The patient also has a previous history of a  subendocardial myocardial infarction on July 24, 2005. The patient  presents to the emergency room today stating that she has pain under her  left axilla area and into her left breast. The patient stated this pain is  not like any pain she has had with her previous stent but was concerned and  therefore presented to the ED for evaluation. The patient states the pain  lasts anywhere from seconds up to a minute. There is no associated nausea,  vomiting or diaphoresis or radiation. The patient does state the pain waxes  and wanes. The patient had two episodes while being examined.   PAST MEDICAL HISTORY:  1.  The patient had a cardiac catheterization on July 24, 2005 in which      she had a stenosis in her proximal LAD where she received a bare metal      stent. It was also stated that there was not any significant compromise      to her diagonal branch. The patient also had a subendocardial myocardial      infarction on July 21, 2005 at [facility].  2.  Hypertension.  3.  Dyslipidemia. The patient was recently started on Lipitor.  4.  Diabetes mellitus.  5.  Gastroesophageal reflux disease.  6.  Osteoarthritis.  7.  Psoriasis.  8.  Morbid obesity.   SOCIAL HISTORY:  The patient  lives in [place] with her spouse. She is a retired  former Merchandiser, retail. The patient has two adult daughters who  are in good health.   FAMILY HISTORY:  Coronary artery disease. A brother died of an MI at age 20.   REVIEW OF SYSTEMS:  SKIN:  Significant for some bruising on her groin and up  in her shoulder area. CARDIOPULMONARY:  She has no complaints of any  shortness of breath, dyspnea on exertion, orthopnea, paroxysmal nocturnal  dyspnea, edema, palpitations or presyncope, no coughing or wheezing.  GU:  The patient also has no complaints of frequency of urination, blood in urine  or stools. ENDOCRINE: No weakness, numbness or mood disturbances. GI: The  patient does have occasional GERD symptoms. All other symptoms are negative  except as noted.   PHYSICAL EXAMINATION:  VITAL SIGNS:  She has a temperature of 98.0, pulse of  64, blood pressure of 140/76, saturating 98% on  two liters.  GENERAL:  She is in no acute distress.  HEENT:  Pupils are equal, round and reactive to light. EOMI. Sclerae clear.  Dentition - she has her own teeth, no erythema or exudate noted.  NECK:  Supple without lymphadenopathy, TMJ, bruit or JVD.  CARDIOVASCULAR:  Regular rate and rhythm S1, S2. No murmurs, rubs, or  gallops.  Pulses are 2+, equal bilaterally, no bruit is appreciated.  LUNGS:  Clear to auscultation without wheezes, rubs or rhonchi.  SKIN:  She does have an area of ecchymosis to her right groin, no hematoma,  no bruit.  ABDOMEN:  Obese, soft, nontender, positive bowel sounds. No organomegaly  appreciated.  EXTREMITIES:  No clubbing, cyanosis or edema, rashes or lesions except as  noted in the right groin.  MUSCULOSKELETAL:  No joint deformity, effusions, no spine or CVA tenderness.  NEURO:  Alert and oriented x3. Cranial nerves II-XII grossly intact.  Strength 5 out of 5 all extremities and SO groups.   Chest x-ray:  Stable cardiomegaly, pulmonary vascular congestion and  changes  of COPD.  EKG shows a sinus bradycardia with a rate of 56.   Labs show a white blood count of 6700, hemoglobin of 11.5, hematocrit of  33.2, platelet count of 240,000. CK-MB is 1.5.   ASSESSMENT:  1.  The patient was seen and examined with Dr. Antoine Poche. She has some      atypical chest pain that is fleeting, that is unlike previous angina.      Initial point of care markers and EKG are negative. Will admit the      patient and observe, cycle cardiac enzymes, check an EKG in the a.m. If      negative, no further cardiac work up.  2.  Hyperlipidemia - continue Lipitor 80 mg q.h.s.  3.  Hypertension - continue Diovan.  4.  Diabetes mellitus - just monitor her blood sugars. Her hemoglobin A1C is      well controlled.     ______________________________  April Humphrey, NP    ______________________________  Rollene Rotunda, M.D.    AH/MEDQ  D:  08/02/2005  T:  08/02/2005  Job:  [id number]   cc:   [name], M.D.  Fax: [phone]   Vida Roller, M.D.  Fax: [phone]

## 2011-01-04 NOTE — Assessment & Plan Note (Signed)
[facility]                         GASTROENTEROLOGY OFFICE NOTE   NAME:MCDONALDBenjamin, Merrihew                  MRN:          [id number]  DATE:09/01/2006                            DOB:          [birth year]    PRIMARY CARE PHYSICIAN:  Dr. [name].  The patient was self-  referred.   CHIEF COMPLAINT:  GERD symptoms, chronic, worse lately.   HISTORY OF PRESENT ILLNESS:  Ms. Amelianna Meller is a very pleasant 76-  year-old who has had many years of what sounds like acid-related  symptoms.  She has burning pain in her throat.  She has an acid taste  occasionally.  She will take Rolaids on a several times a week basis.  She was formerly a patient of Dr. Dionicia Abler, who had her on Nexium, and  while she was on this she does believed it helped.  She had a fairly  dramatic event with pain in her throat that she noted right when she  awoke from bed about 10 days ago.  The pain was a burning pain.  It was  located right in the back of her throat.  Swallowing tended to aggravate  it.  The pain gradually went down her esophageus and then into her  abdomen.  She presented to the emergency room at [facility] for evaluation  with lab tests.  Her CBC was completely normal.  Coags showed her INR  was 1.7.  Her complete metabolic profile was normal.  Cardiac markers  were normal as well.  She thought this may be a recurrence of some of  her GERD symptoms, and so she has been taking Prilosec twice a day since  then.  She takes it 1 to 2 hours before breakfast and 1 hour after  dinner.  The burning has definitely improved since then.   REVIEW OF SYSTEMS:  Notable for a 10 pound weight gain in the past year.  Otherwise, essentially normal and is available on our nursing intake  sheet.   PAST MEDICAL HISTORY:  Morbid obesity, hypertension, coronary artery  disease with an acute MI in 2006, history of atrial fibrillation,  diabetes, elevated cholesterol, arthritis, anxiety,  status post  angioplasty in 2006, status post cholecystectomy.   CURRENT MEDICATIONS:  Klor-Con.  Lasix.  Toprol.  Diovan.  Lipitor.  Aspirin.  Coumadin.  Prilosec.   ALLERGIES:  No known drug allergies.   SOCIAL HISTORY:  Married, 2 children, retired from [employer].  Nonsmoker, nondrinker.   FAMILY HISTORY:  No colon cancer or colon polyps in family.   ASSESSMENT AND PLAN:  A 76 year old woman with likely gastroesophageal  reflux disease, personal history for colorectal polyps.   First, her symptoms do sound GERD related.  She has had normal lab tests  and has been gaining weight, but I think they have been chronic in  nature enough that we should proceed with endoscopy in the next 2 to 3  weeks.  She has been taking Prilosec twice a day, but probably at the  incorrect time in relation to food, and she does feel that, since she  has been taking  the Prilosec, she has been a little more jittery.  So, I  am giving her samples for Protonix that she will take 20 to 30 minutes  prior to her breakfast meal only.  She has also had a personal history  for colorectal polyps, last seen by Dr. Dionicia Abler in 2005.  She is  interested in changing her care here to [facility], so we will get her  records sent over, and we will likely be putting her in our reminder  system for next colonoscopy in 2010.  She is on Coumadin and I think it  is fine for her to stay on her Coumadin for her upcoming endoscopy.     Rachael Fee, MD  Electronically Signed    DPJ/MedQ  DD: 09/01/2006  DT: 09/01/2006  Job #: [id number]   cc:   [name], M.D.

## 2011-01-04 NOTE — Procedures (Signed)
[name], [name]           ACCOUNT NO.:  [id number]   MEDICAL RECORD NO.:  [id number]          PATIENT TYPE:  OUT   LOCATION:  RAD                           FACILITY:  [facility]   PHYSICIAN:  Gerrit Friends. Dietrich Pates, MD, FACCDATE OF BIRTH:  [birth year]   DATE OF PROCEDURE:  DATE OF DISCHARGE:                                ECHOCARDIOGRAM   CLINICAL DATA:  A 76 year old woman with chest pain, hypertension, and  known coronary disease.  M-mode aorta 3.0, left atrium 4.8, septum 1.3,  posterior wall 1.3, LV diastole 4.3, LV systole 3.6.   1. Technically suboptimal but adequate echocardiographic study.  2. Mild left atrial enlargement; right atrium not well seen, but      appears normal in size.  3. Normal right ventricular size and function; mild right ventricular      hypertrophy.  4. Normal aortic valve.  5. Normal diameter of the aortic root; mild calcification of the wall.  6. Normal mitral regurgitation; mild regurgitation.  7. Proximal pulmonary artery grossly normal; pulmonic valve not      adequately imaged.  8. Normal left ventricular size; mild left ventricular hypertrophy;      not all segments adequately imaged - areas of akinesis nor      dyskinesis; overall left ventricular systolic function is low      normal to normal.  9. Normal inferior vena cava.      Gerrit Friends. Dietrich Pates, MD, [facility]  Electronically Signed     RMR/MEDQ  D:  05/12/2007  T:  05/12/2007  Job:  [id number]

## 2011-01-04 NOTE — Discharge Summary (Signed)
[name], [name]           ACCOUNT NO.:  [id number]   MEDICAL RECORD NO.:  [id number]          PATIENT TYPE:  INP   LOCATION:  2001                         FACILITY:  [facility]   PHYSICIAN:  Vida Roller, M.D.   DATE OF BIRTH:  1934-09-29   DATE OF ADMISSION:  08/02/2005  DATE OF DISCHARGE:  08/03/2005                                 DISCHARGE SUMMARY   PRIMARY CARE PHYSICIAN:  Dr. [name].   PRIMARY CARDIOLOGIST:  Dr. Osie Bond.  Both in [place].   PRINCIPAL DIAGNOSIS:  Left breast pain.   OTHER DIAGNOSES:  1.  Coronary artery disease status post bare-metal stenting to the LAD      July 26, 2005.  2.  Hypertension.  3.  Hyperlipidemia.  4.  Type 2 diabetes mellitus.  5.  Gastroesophageal reflux disease.  6.  Osteoarthritis.   ALLERGIES:  NO KNOWN DRUG ALLERGIES.   PROCEDURES:  None.   HISTORY OF PRESENT ILLNESS:  [age]-year-old female with a prior  history of CAD, recent bare-metal stenting of the LAD July 26, 2005.  On  August 02, 2005, she developed left axillary and left breast pain, which  was not similar to previous angina, but caused her concern and therefore she  presented to the [facility] ED for evaluation.  She had no associated  symptoms and her pain was waxing and waning throughout her interview and  physical exam.  She was subsequently for further evaluation.   HOSPITAL COURSE:  The patient was maintained on her home regimen and cardiac  markers remained negative.  As a result, she is being discharged home today  in satisfactory condition with plans for follow-up with Dr. Dorethea Clan in  [place] in three to four weeks.   DISCHARGE LABORATORY:  Hemoglobin 11.5, hematocrit 33.2, WBC 6.7, platelets  240, PT 14.4, INR 1.1, PTT 32, sodium 141, potassium 4.2, chloride 106, CO2  28, BUN 13, creatinine 0.7, glucose 92, total bilirubin 1.0, alkaline  phosphatase 69, AST 21, ALT 17, total protein 6.4, albumin 3.6, calcium 9.7,  magnesium 2.1, cardiac markers are negative x4.   DISPOSITION:  The patient is being discharged home today in good condition.   FOLLOW UP PLANS AND APPOINTMENTS:  She will follow-up with Dr. Dorethea Clan within  the next three to four weeks.   DISCHARGE MEDICATIONS:  1.  Aspirin 325 mg daily.  2.  Plavix 75 mg daily.  3.  K-Dur 20 mEq daily.  4.  Toprol XL 100 mg daily.  5.  Diovan 320 mg daily.  6.  Lipitor 80 mg daily.  7.  Nitroglycerine 0.4 mg sublingual p.r.n. chest pain.   OUTSTANDING LABORATORY STUDIES:  None.   DURATION OF DISCHARGE ENCOUNTER:  Forty minutes including physician time.      Ok Anis, NP      Vida Roller, M.D.  Electronically Signed    CRB/MEDQ  D:  08/03/2005  T:  08/06/2005  Job:  [id number]   cc:   Vida Roller, M.D.  Fax: [phone]   [name], M.D.  Fax: [phone]

## 2011-01-04 NOTE — Cardiovascular Report (Signed)
NAMEJULIAH, SCADDEN           ACCOUNT NO.:  [id number]   MEDICAL RECORD NO.:  [id number]          PATIENT TYPE:  INP   LOCATION:  6522                         FACILITY:  [facility]   PHYSICIAN:  Charlies Constable, M.D. [facility] DATE OF BIRTH:  Oct 23, 1934   DATE OF PROCEDURE:  07/26/2005  DATE OF DISCHARGE:  07/27/2005                              CARDIAC CATHETERIZATION   CLINICAL HISTORY:  Ms. Deeds is 76 years old and was recently admitted to  the hospital with non-ST elevation infarction.  She underwent angiography  and was found to have a 70% stenosis in the LAD.  The lesion was felt to not  be obstructive and there was some potential in compromising the diagonal  branch with intervention so the decision was made to manage her medically.  She was treated with Plavix and aspirin and beta blockers.  After discharge,  she developed recurrent pain and was readmitted today with unstable angina.  We brought her back to the lab with anticipation of intervention on the LAD.  The previous procedure was performed only four days ago.   DESCRIPTION OF PROCEDURE:  The procedure was performed via the left femoral  artery.  Dr. Arvilla Meres obtained access.  We tried multiple catheters  and finally were able to seat a FL4 6 [place] guiding catheter with side  holes in the LAD.  There was a sharp bend from the left main into the LAD  and we had to use a sharp bend on the wire in order to navigate up the LAD,  but we did this with a moderate amount of difficulty.  We then dilated the  lesion in the proximal LAD with a 3.25 by 20 mm Maverick performing one  inflation up to 8 atmospheres for 30 seconds.  There was a diagonal branch  that arose from the lesion that had 40% lesion that arose at right angles  and we had not planned to treat this unless it became compromised.  We next  stented the vessel with a 3 by 18 mm Vision stent.  Because of the large  caliber vessel, we elected to use a bare metal  stent.  We deployed this with  one inflation of 14 atmospheres for 30 seconds.  We then post dilated with a  3.25 by 15 mm Quantum Maverick performing two inflations up to 16  atmospheres for 30 seconds.  Final diagnostic studies were then performed  through the guiding catheter.  The patient tolerated the procedure well and  left the laboratory in satisfactory condition.   RESULTS:  Initially, the stenosis in the proximal LAD was estimated at 80%.  Following stenting, this improved to less than 10%.  There did not appear to  be any significant compromise to the diagonal branch.   CONCLUSION:  Successful PCI of the lesion in the proximal LAD using a bare  metal stent with improvement in narrowing from 80% to less than 10%.   DISPOSITION:  The patient returned to the post anesthesia care unit for  further observation.   ADDENDUM:  The patient was enrolled in the [place] trial and was given  Alla Feeling  study drug.  She also received weight adjusted heparin for an ACT  greater than 200 seconds and she received Integrilin.  We elected to stop  the Integrilin at the end of the procedure because we were unable to close  the femoral artery and we were concerned about bleeding at the femoral  artery site.           ______________________________  Charlies Constable, M.D. [facility]     BB/MEDQ  D:  07/26/2005  T:  07/27/2005  Job:  [id number]   cc:   [name], M.D.  Fax: [phone]   Willa Rough, M.D.  [address]  [address]  [place]  [place] [address]   Salvadore Farber, M.D. [facility]  [address]  [address]  [place]  [place] [address]

## 2011-01-04 NOTE — Discharge Summary (Signed)
[name], Brianna           ACCOUNT NO.:  [id number]   MEDICAL RECORD NO.:  [id number]          PATIENT TYPE:  INP   LOCATION:  A224                          FACILITY:  [facility]   PHYSICIAN:  [name], M.D.    DATE OF BIRTH:  1935-05-17   DATE OF ADMISSION:  08/21/2005  DATE OF DISCHARGE:  01/04/2007LH                                 DISCHARGE SUMMARY   DISCHARGE DIAGNOSES:  1.  New-onset atrial fibrillation with spontaneous conversion to normal      sinus rhythm.  2.  Atherosclerotic cardiovascular disease, status post stenting of the left      anterior descending on July 30, 2005.  Apparent non-Q wave      infarction prior to intervention.  3.  Morbid obesity.  4.  Non-insulin dependent diabetes mellitus.  5.  Hypertension.  6.  Degenerative joint disease.  7.  History of nephrolithiasis.  8.  Hyperlipidemia.  9.  Gastroesophageal reflux disease.   HISTORY OF PRESENT ILLNESS:  For details of regarding admission, please  refer to the admitting note.  Briefly, this 76 year old female with the  above history had undergone two catheterizations in the month of December.  Ultimately, she underwent stenting as noted above.  Ejection fraction was  65%.  She presented to the emergency department the morning of admission  complaining of palpitations and a funny feeling in her chest.  There were  no symptoms compatible with angina.  She was found to have atrial  fibrillation on the monitor.  Rate was in the 80s to 90s.  There were no  significant ST and T changes.  Initial cardiac markers were negative.  While  waiting for admission to the hospital, the patient's rhythm converted to  normal sinus rhythm without recurrence of atrial fibrillation.   HOSPITAL COURSE:  The patient was admitted for close observation and  cardiology consult.  Her medications were continued initially.  Dr. Juanda Chance  and Dr. Dorethea Clan felt that the patient should be coumadinized as well as left  on  aspirin and Plavix.  Dr. Dietrich Pates added that she did not need to be  treated with Lovenox at this time.  She remained stable and coumadinization  was begun and cardiology felt she could be followed as an outpatient.   DISCHARGE MEDICATIONS:  1.  Aspirin 81 mg daily.  2.  Plavix 75 mg daily.  3.  K-Dur 20 mEq daily.  4.  Toprol XL 100 mg daily.  5.  Diovan 320 mg daily.  6.  Lipitor 80 mg daily.  7.  Coumadin 5 mg daily.   FOLLOW UP:  She will be followed closely as an outpatient.      [name], M.D.  Electronically Signed     MC/MEDQ  D:  09/15/2005  T:  09/16/2005  Job:  [id number]

## 2011-01-04 NOTE — H&P (Signed)
[name], [name]           ACCOUNT NO.:  [id number]   MEDICAL RECORD NO.:  [id number]          PATIENT TYPE:  INP   LOCATION:  1827                         FACILITY:  [facility]   PHYSICIAN:  [name], M.D.     DATE OF BIRTH:  08-Apr-1935   DATE OF ADMISSION:  07/26/2005  DATE OF DISCHARGE:                                HISTORY & PHYSICAL   HISTORY OF PRESENT ILLNESS:  Mrs. [name] is a 76 year old patient of Dr.  Myrtis [name] and Dr. Samule [name]. She was just discharged from the hospital on July 23, 2005.  She was transferred down from [facility] on December 3  with a subendocardial myocardial infarction.  Repeat CPK was 198 with an MB  of 6.8 and a troponin of 0.85.  Her catheterization done by Dr. Samule [name] on  July 22, 2005 showed a reported 60% stenosis in the mid LAD.  I reviewed  the films.  The patient's mid LAD lesion looked tighter than 60% to me. She  had a diagonal lesion which did not seem critical.  Her other arteries did  not have flow-limiting lesions. She had normal LV function.  When she was  discharged, she was pain free and she never had acute EKG changes.   She was discharged on aspirin and platelets.   She had recurrent chest pain tonight after dinner and the pain was a little  different than previous.  Previously she had pain radiating down her arm.  This pain felt more like indigestion.  However, it persisted and waxed and  waned.  It was relieved with nitroglycerin at [facility].   Given the borderline lesion in the midline LAD and recurrent symptoms, I  felt that she should come back down to [facility]   PAST MEDICAL HISTORY:  1.  Diabetes with a well-controlled hemoglobin A1C and no insulin.  2.  Morbid obesity.  3.  Hypertension.  4.  Reflux.  5.  Osteoarthritis.  6.  Psoriasis.   FAMILY HISTORY:  There is a family history of coronary disease.  A brother  dying of an MI at age 29.  She has hyperlipidemia.   DISCHARGE  MEDICATIONS:  1.  Aspirin one a day.  2.  Plavix 75 mg a day.  3.  Nitroglycerin p.r.n.  4.  Toprol-XL 100 mg a day.  5.  Lipitor 80 mg a day.  6.  Diovan 320 mg a day.  7.  Lasix 40 mg a day.  8.  Potassium 20 mEq a day.  9.  Darvocet p.r.n.   ALLERGIES:  She denied any allergies.   PHYSICAL EXAMINATION:  VITAL SIGNS: Blood pressure 130/60, pulse 70 and  regular.  LUNGS:  Clear.  NECK:  Carotids are normal.  HEART:  S1 and S2 with distant heart sounds.  SKIN:  She has some psoriatic lesions on the right elbow.  ABDOMEN:  Benign.  She has some ecchymosis over her previous catheter site  but no hematoma.  EXTREMITIES:  She also has ecchymosis on both arms.  Distal pulses are  intact with trace edema.   SOCIAL  HISTORY:  The patient lives in [place] with her husband.  She has two  adult female children who are healthy.  She is retired and used to work for  [employer].  She does not smoke or drink alcohol.   Her EKG at [facility] showed no acute changes.  Initial point-of-  care enzymes were negative.   IMPRESSION:  I reviewed Mrs. [name]'s films.  I am concerned about the mid  LAD lesion being more active.  There are two choices here.  The patient can  have repeat heart catheterization with either ultrasound or fractional flow  reserve to assess the true area of percent stenosis.   Alternatively, an adenosine Myoview should easily pick up any ischemia in  the anterior distribution.   We will continue her Integrilin, heparin and nitroglycerin. She is on a beta  blocker and I did note that she was not sent home on a statin.  This may be  instituted this hospitalization.   I will have Dr. Juanda [name] and Dr. Samule [name] review the films and they can decide  as to whether to assess lesion again invasively or noninvasively.           ______________________________  [name], M.D.     PN/MEDQ  D:  07/26/2005  T:  07/26/2005  Job:  [id number]

## 2011-01-04 NOTE — Cardiovascular Report (Signed)
[name], [name]           ACCOUNT NO.:  [id number]   MEDICAL RECORD NO.:  [id number]          PATIENT TYPE:  INP   LOCATION:  3732                         [facility]ACILITY:  [facility]   PHYSICIAN:  Salvadore Farber, M.D. LHCDATE OF BIRTH:  04/19/1935   DATE OF PROCEDURE:  07/22/2005  DATE OF DISCHARGE:                              CARDIAC CATHETERIZATION   PROCEDURE:  Left heart catheterization, left ventriculography, coronary  angiography.   INDICATIONS:  [name] is a 76 year old lady with morbid obesity and  diabetes mellitus who presents with non-ST segment elevation myocardial  infarction. She had primarily left elbow pain on Saturday evening, lasting  approximately 30 minutes. She was admitted to hospital. Troponin rose to  0.61 and declined thereafter. She has had no recurrence of her chest  discomfort. She was treated with aspirin, heparin, and eptifibatide. She  presents to the lab work for cardiac catheterization and possible coronary  revascularization.   PROCEDURE TECHNIQUE:  Informed consent was obtained. Under 1% lidocaine  local anesthesia, a 5-French sheath was placed in the right common femoral  artery using modified Seldinger technique. Diagnostic angiography and  ventriculography were performed using JL-4, JR-4 and pigtail catheters. The  patient tolerated the procedure well and was transferred to the holding room  in stable condition.   COMPLICATIONS:  None.   FINDINGS:  1.  LV: 161/15/21. EF 65% without regional wall motion abnormality.  2.  No aortic stenosis or mitral regurgitation.  3.  Left main:  Angiographically normal.  4.  LAD:  Moderate-sized vessel giving rise to a single large diagonal. The      proximal and mid-LAD are moderately calcified. There is a 60% stenosis      of the LAD just after the takeoff of diagonal. The diagonal is fairly      large and has a 50% ostial stenosis.  5.  Ramus intermedius:  Moderate-sized vessel which is  angiographically      normal.  6.  Circumflex:  Moderate-sized vessel giving rise to two marginal's. It is      angiographically normal.  7.  RCA:  Large, dominant vessel. There is a 30% stenosis proximally and 40%      stenosis after the acute marginal.   IMPRESSION/PLAN:  Moderate nonobstructive coronary disease with preserved  left ventricular systolic function and no regional wall motion abnormality.  Symptoms of troponin are very suggestive of an acute coronary syndrome. I  suspect that her culprit lesion is either the mid-LAD or diagonal lesion.  Both were now clearly only moderately stenosed. I therefore recommend  medical therapy. We will add Plavix to her current regimen.      Salvadore Farber, M.D. [facility]  Electronically Signed     WED/MEDQ  D:  07/22/2005  T:  07/22/2005  Job:  [id number]   cc:   [name], M.D.  Fax: [phone]   Willa Rough, M.D.  [address]  [address]  [place]  [place] [address]

## 2011-01-04 NOTE — Letter (Signed)
November 26, 2007     RE:  ZHANIA, SHAHEEN  MRN:  [id number]  /  DOB:  [birth year]   To who it may concern,   [place] is a patient I follow in [facility].  She has  significant coronary artery disease and fluid issues.  I am writing in  support that she and her husband have preferential repair should their  phone services not work.  With her medical issues, Mrs. Ke is at  increased risk for needing emergency services.  I would be happy to talk  to someone there are any further questions.  I can be reached through  our office at [phone].   Sincerely,    Sincerely,      Pricilla Riffle, MD, [facility]  Electronically Signed    PVR/MedQ  DD: 11/26/2007  DT: 11/26/2007  Job #: [id number]

## 2011-01-04 NOTE — Consult Note (Signed)
NAME:  [name], [name]                     ACCOUNT NO.:  [id number]   MEDICAL RECORD NO.:  [id number]                  PATIENT TYPE:   LOCATION:                                       FACILITY:   PHYSICIAN:  Lionel December, M.D.                 DATE OF BIRTH:   DATE OF CONSULTATION:  09/21/2003  DATE OF DISCHARGE:                                   CONSULTATION   CONSULTING PHYSICIAN:  Lionel December, M.D.   REFERRING PHYSICIAN:  [name], M.D.   REASON FOR CONSULTATION:  GERD symptoms, poorly controlled with therapy,  chronic/intermittent diarrhea.   HISTORY OF PRESENT ILLNESS:  [name] is a 76 year old Caucasian female who  was referred through the courtesy of Dr. Nobie Putnam for GI evaluation.  She  has been experiencing nocturnal regurgitation with a bitter taste in her  mouth for about a year.  She was on Protonix for seven months but could not  tell any difference.  She also has been tried on Prevacid which helped some  but not to her satisfaction.  She also complains of the need to clear her  throat frequently and has had intermittent hoarseness.  However, she denies  heartburn.  She has had occasional dysphagia primarily to solids.  She also  complains of intermittent diarrhea which she has had for years.  She denies  abdominal cramps, melena, or rectal bleeding.  She was seen by Dr. Nobie Putnam  recently.  He felt that she should also have a colonoscopy given her age.   REVIEW OF SYSTEMS:  Negative for weight loss, chest pain or dyspnea.  She  has been on the Atkin's diet and is trying to loose weight.  The patient  states that she had lab studies last month which reportedly were normal.   CURRENT MEDICATIONS:  1. Rolaids p.r.n.  2. Skelaxin 400 mg every day p.r.n.  3. Darvocet-N 100 p.r.n.  4. Klor-Con 20 mEq every day.  5. Flax seed oil t.i.d.  6. Multivitamin every day.  7. Lasix 40 mg every day.  8. Toprol 50 mg every day.  She does not take daily.  9. Diovan  320 mg every day.  10.      Vitamin C 500 mg t.i.d.  11.      Sudafed on a p.r.n. basis.  12.      Imodium on a p.r.n. basis.   PAST MEDICAL HISTORY:  1. Psoriasis with very mild rash over her upper extremities and left lower     extremity.  2. She has been hypertensive for the last couple of years.  3. She has had problems with fluid retention for many years.  4. She also gives a history of non-insulin-dependent diabetes mellitus.  Her     last hemoglobin A1c was 5.7.  She also has had a weight problem for years     and now on the Atkin's diet trying to loose weight.  5. She had a benign cyst removed from her right heel in 1991.  6. She had a laparoscopic cholecystectomy in October 2003 for symptomatic     cholelithiasis.   ALLERGIES:  None known.   FAMILY HISTORY:  Mother died of MI at age 55.  Father had CAD and died of a  CVA at age 56.  She has one brother living.  Three brothers are deceased.  One of MI at 10, one of CHF in his 11s, and one died of COPD/emphysema, and  the third brother had advanced COPD but committed suicide at 4.  One sister  had COPD and died at 41, another one died of cardiac problems in her 25s.   SOCIAL HISTORY:  She is married.  She has two children.  She is retired from  _________, where she worked for 21 years.  She does  not smoke cigarettes or  drink alcohol.   PHYSICAL EXAMINATION:  GENERAL:  A pleasant, very obese Caucasian female who  is in no acute distress.  VITAL SIGNS:  She weighs 293.5 pounds.  She is 5 feet 5 inches tall.  Pulse  70 per minute, blood pressure 122/60, temp is 97.7.  HEENT:  Conjunctivae pink.  Sclera nonicteric.  Oropharyngeal mucosa is  normal.  A few teeth are missing.  The rest are in satisfactory condition.  NECK:  Without masses or thyromegaly.  No carotid bruits are noted.  CARDIAC:  Regular rhythm.  Normal S1, S2.  No murmur or gallop noted.  LUNGS:  Clear to auscultation.  ABDOMEN:  Obese.  Bowel sounds are  normal.  Palpation reveals a soft abdomen  with no organomegaly or masses.  Examination is somewhat limited because of  her body habitus.  RECTAL:  Reveals skin tags.  Digital exam reveals a normal sphincter tone.  She has formed stool in the vault which is guaiac negative.  EXTREMITIES:  She has at least 1+ pitting edema involving both her legs.  No  clubbing noted.   ASSESSMENT:  1. [name] is a 76 year old Caucasian female who has chronic     gastroesophageal reflux disease with atypical symptoms in the form of     hoarseness and need to clear her throat as well as nocturnal     regurgitation.  She has not responded well to two different proton pump     inhibitors.  It appears that she is already on anti-reflux measures but     weight reduction would be very beneficial.  She does not have any long     symptoms but given age of onset of these symptoms, an upper     gastrointestinal tract needs to be evaluated to make sure she does not     have Barrett's esophagus or a complicated gastroesophageal reflux     disease.  2. She has chronic/intermittent nonbloody diarrhea, felt to be due to     irritable bowel syndrome.  I agree with Dr. Nobie Putnam, she needs to have a     colonoscopy primarily for screening purposes.   RECOMMENDATIONS:  1. Antireflux measures reinforced.  2. We will try her on Nexium 40 mg before the evening meal, samples given.  3. Esophagogastroduodenoscopy and total colonoscopy to be performed at [facility] in the near future.  I have reviewed both procedures and     risks with the patient, and she is agreeable.   We would like to thank Dr. Nobie Putnam for the opportunity  to participate in  the care of this nice lady.      ___________________________________________                                            Lionel December, M.D.   NR/MEDQ  D:  09/20/2003  T:  09/20/2003  Job:  [id number]   cc:   [name], M.D.  [address] [place]  [place] [address]  Fax: [phone]   [facility]

## 2011-01-04 NOTE — Consult Note (Signed)
[name], [name]           ACCOUNT NO.:  [id number]   MEDICAL RECORD NO.:  [id number]          PATIENT TYPE:  INP   LOCATION:  ED99                          FACILITY:  [facility]   PHYSICIAN:  Vida Roller, M.D.   DATE OF BIRTH:  1935-02-18   DATE OF CONSULTATION:  08/21/2005  DATE OF DISCHARGE:                                   CONSULTATION   PRIMARY:  [name], M.D.   HISTORY OF PRESENT ILLNESS:  [name] is a woman that we follow with  coronary artery disease who recently had a non-ST segment elevation  myocardial infarction with significant lesion in her LAD. She received a  bare-metal stent to her LAD on July 26, 2005 and has done well until  early this morning when she had the onset of palpitations. She presented to  the emergency room and was found to be in atrial fibrillation. She was given  her medications early, and she spontaneously converted to sinus rhythm and  feels well now. Had no chest pain. No syncope. No PND. No orthopnea. States  that she ate some chocolate, and she thinks that might have thrown her into  the atrial fibrillation.   PAST MEDICAL HISTORY:  Significant for coronary artery disease as previously  described. She has normal LV systolic function on heart catheterization  performed after her myocardial infarction. She has hypertension,  hyperlipidemia, diabetes, morbid obesity, gastroesophageal reflux disease  and osteoarthritis as well as a history of psoriasis.   SOCIAL HISTORY:  She lives in [place] with her husband. She is retired. She is  married. She has two adult daughters. She does not smoke, drink or use  illicit drugs.   MEDICATIONS PRIOR TO ADMISSION:  1.  Aspirin 325 once a day.  2.  Plavix 75 mg once a day.  3.  K-Dur 20 once a day.  4.  Toprol 100 once a day.  5.  Diovan 320 once a day.  6.  Lipitor 80 once a day.  7.  Nitroglycerin on an as needed basis.   REVIEW OF SYSTEMS:  She denies any fevers, chills, sweats,  weight loss,  adenopathy. No headaches, sinus tenderness, sinus discharge, vertigo. No  skin rashes. No urinary frequency or urgency. No myalgias or arthralgias. No  weakness. No nausea, vomiting, diarrhea, bright red blood per rectum, melena  or hematochezia. No hematemesis. No odynophagia or dysphagia. She has no  polyuria or polydipsia. The remainder of her review of systems is negative.   PHYSICAL EXAMINATION:  GENERAL:  She is an obese, white female in no  apparent distress who was eating breakfast without any complaints. She is  afebrile, and pulse is 58 and regular in sinus rhythm on the monitor. Her  oxygen saturation is 100% on 2 liters nasal cannula. Blood pressure 113/76  in both arms.  HEENT:  Unremarkable.  NECK:  Supple. There is no obvious jugular venous distention or carotid  bruits. Her neck is quite full.  CHEST:  Clear to auscultation.  CARDIOVASCULAR:  Regular. There is no murmur. Heart sounds are reasonably  distant.  ABDOMEN:  Obese but soft,  nontender.  EXTREMITIES:  Lower extremities have trivial edema.  GENITOURINARY/RECTAL/BREASTS:  Deferred.  NEUROLOGICAL/MUSCULOSKELETAL:  Grossly nonfocal.   STUDIES:  She has not had a chest x-ray. Her most recent chest x-ray from  her admission was unremarkable. Electrocardiogram on admission shows atrial  fibrillation at a rate of 77 with normal axis and normal QRS duration at 90,  QT of 438, nonspecific ST-T wave changes, poor R-wave progression consistent  with her body habitus.   LABORATORY DATA:  White blood cell count 7.2, H and H of 12 and 35, platelet  count 189. Sodium 138, potassium 3.3, chloride 103, bicarb 26, BUN 26,  creatinine 0.9, blood sugar 104. Cardiac enzymes x1 are negative. D-dimer  0.39. B-type natriuretic peptide 142.   ASSESSMENT:  So, this is a lady with new onset atrial fibrillation who is  currently in sinus. Appears to have paroxysmal atrial fibrillation. I think  she is at high risk for  embolic phenomenon from her atrial fibrillation. As  such, I called Dr. Charlies Constable and spoke with him regarding how to pursue  her anticoagulation. I do not believe she needs to be rate controlled any  more than she is already being adequately rate controlled, and I do not  believe she needs an anti-arrhythmic drug at this time. I do believe,  however, that we need to seriously consider adding Coumadin due to her high  risk of thromboembolic disease. Question is what to do with her antiplatelet  medications because she has a relatively new stent. The decision was to  continue her Plavix, decrease her aspirin dose to 81 mg, and put her on full  dose Coumadin with an INR targeted at 2 to 3. Will do this in the hospital  to make sure she does not have any significant bleeding problems. Once her  INR is therapeutic, she can probably go home, and we will follow her in our  Coumadin clinic.      Vida Roller, M.D.  Electronically Signed     JH/MEDQ  D:  08/21/2005  T:  08/21/2005  Job:  [id number]

## 2011-01-04 NOTE — Discharge Summary (Signed)
[name], [name]           ACCOUNT NO.:  [id number]   MEDICAL RECORD NO.:  [id number]          PATIENT TYPE:  INP   LOCATION:  3732                         FACILITY:  [facility]   PHYSICIAN:  Learta Codding, M.D. LHCDATE OF BIRTH:  05-Oct-1934   DATE OF ADMISSION:  07/21/2005  DATE OF DISCHARGE:  07/23/2005                                 DISCHARGE SUMMARY   PHYSICIANS:  Primary cardiologist is new and will be Vida Roller, M.D.  Primary care physician: [name], M.D.   PRIMARY DIAGNOSIS:  Non-ST-segment-elevation myocardial infarction.   SECONDARY DIAGNOSES:  1.  Diabetes mellitus.  2.  Morbid obesity.  3.  Hypertension.  4.  Gastroesophageal reflux disease.  5.  Osteoarthritis.  6.  Family history of coronary artery disease in a brother who died of an      myocardial infarction at age 69.  7.  Hyperlipidemia with total cholesterol of 205, triglycerides 226, HDL 42,      LDL 118.   PROCEDURES:  1.  Cardiac catheterization.  2.  Coronary arteriogram.  3.  Left ventriculogram.   HOSPITAL COURSE:  [name] is a 76 year old female with no previous  history of coronary artery disease but multiple cardiac risk factors.  She  had onset of substernal chest pain and left arm pain at 7 p.m. that was  associated with dizziness in minimal activity.  She went to [facility], and her troponin was mildly elevated, so she was transferred to  [facility] for further evaluation and treatment.   She ruled in for a non-ST-segment-elevation MI with a peak CK-MB of 198/6.8  and a peak troponin of 0.85.  It was felt that cardiac catheterization was  indicated, and this was performed on July 22, 2005.   The cardiac catheterization showed an EF of 65% without region wall motion  abnormality.  She had a 50% diagonal stenosis and a 60% LAD stenosis as well  as a 30 and 40% stenoses in the RCA.  Dr. Samule Ohm felt that the culprit  lesion was either the first diagonal  or the LAD, but both were now  moderately stenosed, and he, therefore, recommended medical therapy.  Plavix  was added to her medication regimen, and her aspirin was increased from 81  to 325 mg a day.   The next day, [name] ambulated and was educated by cardiac rehab.  She  did well and had no chest pain or shortness of breath with ambulation.  Her  beta blocker had been increased, and her systolic blood pressure was fairly  well controlled between 110 and 130.  Lipitor had been added to her  medication regimen as well.  [name] says that she is on a diet plan  and is currently eating 5 small meals a day.  This eating plan is similar to  Atkins, and she was advised to continue this and follow up with her primary  MD but try to limit the saturated fats.   Pending evaluation by Dr. Diona Browner, [name] is tentatively considered  stable for discharge on July 23, 2005, with outpatient  followup in  [place] arranged.   DISCHARGE INSTRUCTIONS:  1.  Her activity is to be increased gradually and is to be per the cardiac      rehab guidelines.  2.  She is to call our office for any problems with the catheterization      site.  3.  She is to follow up with [facility] for Dr. Dorethea Clan on December 18 at 1      p.m..  4.  She is to follow up with Dr. Nobie Putnam as needed.   DISCHARGE MEDICATIONS:  1.  Coated aspirin 325 mg a day.  2.  Plavix 75 mg a day.  3.  Nitroglycerin sublingual p.r.n.  4.  Toprol XL 100 mg a day.  5.  Lipitor 80 mg a day.  6.  Diovan 320 mg a day.  7.  Lasix 40 mg a day.  8.  Potassium 20 mEq a day.  9.  Zicam nasal spray p.r.n. as prior to admission.  10. Darvocet p.r.n. as prior to admission.   TIME SPENT AT DISCHARGE:  57 minutes.      Theodore Demark, P.A. [facility]      Learta Codding, M.D. [facility]  Electronically Signed    RB/MEDQ  D:  07/23/2005  T:  07/23/2005  Job:  [id number]   cc:   Vida Roller, M.D.  Fax: [phone]   [name],  M.D.  Fax: [phone]

## 2011-01-04 NOTE — H&P (Signed)
NAMEMARAM, BENTLY           ACCOUNT NO.:  [id number]   MEDICAL RECORD NO.:  [id number]          PATIENT TYPE:  INP   LOCATION:  A224                          FACILITY:  [facility]   PHYSICIAN:  [name], M.D.    DATE OF BIRTH:  1934/12/07   DATE OF ADMISSION:  08/21/2005  DATE OF DISCHARGE:  LH                                HISTORY & PHYSICAL   CHIEF COMPLAINT:  Palpitations.   HISTORY OF PRESENT ILLNESS:  This is a 76 year old female with a history of  atherosclerotic cardiovascular disease.  She underwent stenting of the left  anterior descending artery on 12 December.  Two days previously, she had  been catheterized and found to have 60% LAD as well as 50% diagonal stenosis  with ejection fraction of 65%.  Apparently she suffered a non-Q-wave  infarction prior to the second catheterization.   The patient also has a history of non-insulin-dependent diabetes mellitus,  hypertension, morbid obesity, degenerative joint disease, and a history of  nephrolithiasis.  She also has hyperlipidemia.  She is treated  intermittently for gastroesophageal reflux symptoms.   The patient presented to the emergency department this morning with  complaint of palpitations and funny feeling in her chest.  She denies any  symptoms compatible with angina.  She was found to have atrial fibrillation  on the monitor.  Rate was in the 80s to 90s.  There were no significant ST-T  changes.  Initial cardiac markers negative.   The patient is admitted with new onset atrial fibrillation approximately one  month post stenting of left anterior descending artery as described.   There is no history of headache, neurologic deficits, shortness of breath,  syncope, or presyncope, nausea, vomiting, melena, hematemesis, hematochezia,  or genitourinary symptoms.   CURRENT MEDICATIONS:  1.  Toprol XL 100 daily.  2.  Lasix 40 daily.  3.  K-Dur 20 mEq daily.  4.  Aspirin 80 daily.  5.  Darvocet-N 100 p.r.n.  6.  Nitroglycerin p.r.n.  7.  Ambien.  8.  Plavix 75 daily.  9.  Diovan 320 daily.  10. Fish oil 200 daily.   ALLERGIES:  None known.   PAST MEDICAL HISTORY:  As noted above.  She also has psoriasis.   FAMILY HISTORY:  Significant for strokes and heart attacks in both her  parents and also siblings as well.   REVIEW OF SYSTEMS:  Negative except as mentioned.   SOCIAL HISTORY:  Nonsmoker, nondrinker.   PHYSICAL EXAMINATION:  GENERAL:  A very  pleasant female who is fully alert  and oriented, in no acute distress.  VITAL SIGNS:  Weight approximately 290 pounds.  Blood pressure 100/70.  Monitor currently reveals sinus bradycardia at a rate of approximately 60.  She is afebrile.  HEENT: Normocephalic and atraumatic.  Pupils are equal.  Ears, nose, throat  benign.  NECK: Supple with no bruits, thyromegaly, lymphadenopathy.  LUNGS:  Clear.  HEART:  Sounds are perfectly normal except for bradycardia.  No murmurs,  rubs, or gallops.  ABDOMEN:  Nontender, nondistended.  Bowel sounds are intact.  EXTREMITIES:  No clubbing, cyanosis, or  edema.  NEUROLOGIC:  Exam is fully within normal limits.   ASSESSMENT:  New onset atrial fibrillation in 76 year old female with above  history, currently converted spontaneously.  No evidence of ischemic event  at this time.   PLAN:  1.  We will admit for close monitoring.  2.  Consult cardiology.  3.  Lovenox empirically at this time with possible coumadinization pending      their opinion.  4.  Will follow and treat expectantly.      [name], M.D.  Electronically Signed     MC/MEDQ  D:  08/21/2005  T:  08/21/2005  Job:  [id number]

## 2011-01-04 NOTE — Op Note (Signed)
NAME:  [name], [name]                     ACCOUNT NO.:  [id number]   MEDICAL RECORD NO.:  [id number]                   PATIENT TYPE:  AMB   LOCATION:  DAY                                  FACILITY:  [facility]   PHYSICIAN:  Sandria Bales. Ezzard Standing, MD                 DATE OF BIRTH:  04/06/1936   DATE OF PROCEDURE:  06/16/2002  DATE OF DISCHARGE:                                 OPERATIVE REPORT   PREOPERATIVE DIAGNOSIS:  Chronic cholecystitis with cholelithiasis.   POSTOPERATIVE DIAGNOSIS:  Chronic cholecystitis with cholelithiasis.   PROCEDURE:  Laparoscopic cholecystectomy with intraoperative cholangiogram.   SURGEON:  Sandria Bales. Ezzard Standing, MD   FIRST ASSISTANT:  Gita Kudo, M.D.   ANESTHESIA:  General endotracheal.   ESTIMATED BLOOD LOSS:  Minimal.   INDICATIONS FOR PROCEDURE:  [name] is a 76 year old, morbidly obese,  white female, who has had some vague upper abdominal symptoms and documented  gallstones by ultrasound.  She now comes for attempted laparoscopic  cholecystectomy.  The indications and potential complications including but  not limited to infection, bleeding, bile duct injury, bowel injury, and open  surgery are explained to the patient and comes for cholecystectomy.   DESCRIPTION OF PROCEDURE:  The patient placed in a supine position.  She had  PAS stockings in place, given 1 g of Ancef at the initiation of the  procedure.  She had a general endotracheal anesthetic.  Her abdomen was  prepped with Betadine solution and sterilely draped.  An infraumbilical  incision was made with sharp dissection carried down to the abdominal  cavity.  Upon entering the abdominal cavity, I then placed a 0 degree  laparoscope through a 12 mm Hasson trocar.  The Hasson trocar was secured  with a 0 Vicryl suture and the abdomen insufflated with CO2 approximately  four liters.   Abdominal exploration revealed the right and left lobes of the liver  unremarkable.  The anterior  wall of the stomach was unremarkable.  Omentum  covered the remainder of the abdomen, and I was unable to see any other  bowel or organs.   Three additional trocars were placed, a 10 mm Ethicon trocar in the mid  subcostal location, a 5 mm Ethicon trocar in the right mid subcostal, and a  5 mm Ethicon trocar in the right lateral subcostal location.  Through the 0  degree laparoscope, the gallbladder was then grasped and rotated cephalad.  The liver was noted to be partly fatty replaced but had no other nodularity  to it.  The patient had omentum and the duodenum stuck up to the anterior  wall.  This was taken down by the coagulation and sharp dissection with  scissors.  I got down to the cystic duct, identified the cystic artery,  triply Endo Clipped and then divided.  I opened up the triangle of Calot,  identifying the cystic duct and the liver.  I then  placed a clip on the  gallbladder side of the cystic duct and shot an intraoperative  cholangiogram.   Intraoperative cholangiogram in this site using a cutoff taut catheter  inserted through a 14 gauge Jelco catheter.  A cut was measured in size of  the cystic duct, the taut catheter inserted into the cystic duct, and there  are clips supporting the taut catheter.  Under fluoroscopy, I injected about  6 cc of contrast ______ Hypaque solution, showing free flow of contrast down  the cystic duct, into the common bile duct, into the duodenum, and refluxing  up above hepatic radicles.   The taut catheter was then removed as was the clip.  The cystic duct was  triply Endo Clipped and divided.  I then sharply and bluntly dissected the  gallbladder from the gallbladder bed.  There was a posterior branch of the  cystic artery where a single clip was placed on it.  Prior to complete  division of the gallbladder from the gallbladder bed, the triangle of Calot  and the gallbladder bed were again revisualized.  The gallbladder was then  divided,  placed into the EndoCatch bag, and delivered through the umbilicus  and sent to pathology.   The abdomen was then irrigated with about one liter of saline.  There was no  bleeding or bile leak from the liver bed.  These trocars were then removed  under direct visualization.  The 0 Vicryl suture was used to close the  umbilical trocar.  I did not have to put a stitch in the other trocar sites.   The skin was closed with a 5-0 Monocryl suture and the skin painted with  tincture of Benzoin and Steri-Strips.  The patient tolerated the procedure  well and was transported to the recovery room in good condition.  The sponge  and needle count were correct at the end of the case.                                               Sandria Bales. Ezzard Standing, MD    DHN/MEDQ  D:  06/16/2002  T:  06/16/2002  Job:  [id number]   cc:   [name], MD  [address], [address]  [place]  [place] [address]  Fax: [phone]

## 2011-01-04 NOTE — Discharge Summary (Signed)
NAMEARMENIA, [name]           ACCOUNT NO.:  [id number]   MEDICAL RECORD NO.:  [id number]          PATIENT TYPE:  INP   LOCATION:  6522                         FACILITY:  [facility]   PHYSICIAN:  Charlton Haws, M.D.     DATE OF BIRTH:  Sep 15, 1934   DATE OF ADMISSION:  07/26/2005  DATE OF DISCHARGE:  07/27/2005                                 DISCHARGE SUMMARY   DISCHARGE DIAGNOSES:  1.  Chest pain, status post cardiac catheterization/percutaneous coronary      intervention with stent to the left anterior descending using a Multi-      Link Vision.  2.  Left hematoma, post sheath pull, resolved.  3.  Coronary artery disease, status post myocardial infarction on July 21, 2005, with cardiac catheterization showing normal left ventricular      function at that time.  4.  Diabetes, well controlled with diet.  5.  Morbid obesity.  6.  Hypertension, stable.   PAST MEDICAL HISTORY:  1.  Diabetes.  2.  Morbid obesity.  3.  Hypertension.  4.  Reflux.  5.  Osteoarthritis.  6.  Psoriasis.  7.  Recent MI, cardiac catheterization on July 22, 2005, that showed a      40% to 60% stenosis in the mid LAD.   HISTORY OF PRESENT ILLNESS:  [name] is a 76 year old Caucasian female  followed by Dr. Myrtis Ser and Dr. Samule Ohm, admitted to [facility] on July 26, 2005, for chest pain. [name] had recently had a cardiac  catheterization status post MI on July 22, 2005, at which time she was  found to have a reported 60% stenosis in the mid LAD. She had diagonal  lesion which not seem critical. She had normal LV function. She was  discharged home pain free on aspirin and Plavix. On the day of admission,  the patient returned to [facility] complaining of recurrent chest  discomfort. She represented to [facility] in which her chest  discomfort was relieved with nitroglycerin given the borderline lesion in  the mid LAD and her recurrent symptoms it was felt she should be  transferred  to [facility] for further evaluation. The patient was taken to the  cath lab on July 26, 2005, by Dr. Juanda Chance with results as stated above.  The patient tolerated the procedure without complications. She did develop a  hematoma during the sheath pull. Pressure was applied. No residual  complications resulting. However, cath site continues to ooze mildly. The  patient is being discharged home on July 27, 2005, if cath site remains  stable, she is afebrile, potassium of 3.4 which has been treated prior to  discharge. BUN 13, creatinine 0.9, hemoglobin 12.1, hematocrit 34.8.  The  patient has a follow-up appointment with Dr. Dorethea Clan, on December 18th of  which this appointment she will keep. She has been given  [facility]  post cardiac catheterization discharge instructions.   Medications are as previously including nitroglycerin for chest pain,  aspirin 325 mg, Plavix 75 mg, Toprol XL 100 mg, Lipitor 80 mg, Diovan 320  mg, Lasix 40 mg daily, potassium 20 mg.  I have given her a prescription for  Ambien 5 mg for 30 days, no refills. Follow-up with Dr. Dorethea Clan.   DURATION OF DISCHARGE:  30 minutes.      Dorian Pod, NP    ______________________________  Charlton Haws, M.D.    MB/MEDQ  D:  07/27/2005  T:  07/27/2005  Job:  [id number]   cc:   Vida Roller, M.D.  Fax: [phone]   Charlies Constable, M.D. [facility]  [address]  [address]  [place]  [place] [address]

## 2011-01-04 NOTE — H&P (Signed)
[name], [name]           ACCOUNT NO.:  [id number]   MEDICAL RECORD NO.:  [id number]          PATIENT TYPE:  INP   LOCATION:  1833                         FACILITY:  [facility]   PHYSICIAN:  Caryn Bee L. Maisie Fus, M.D. [facility] OF BIRTH:  June 11, 1935   DATE OF ADMISSION:  07/21/2005  DATE OF DISCHARGE:                                HISTORY & PHYSICAL   CARDIOLOGIST:  Seen in the 81s: Willa Rough, M.D., [facility].   PRIMARY CARE PHYSICIAN:  [name], M.D., [facility]  [place], [place].   CHIEF COMPLAINT:  Chest pain, left arm pain.   HISTORY OF PRESENT ILLNESS:  This is a 76 year old female with no prior  known history of coronary disease who presented to [facility] with  the complaint of left arm pain and substernal chest pain which began  approximately 6:30 to 7 o'clock p.m. The patient was cleaning her kitchen at  that time and noticed left arm pain subsequently radiating to the center of  her chest, and the chest pain lasted for approximately 5 to 10 minutes. She  continued to have intermittent arm pain on and off for approximately one  hour and subsequently presented to [facility].  The patient also  remarks that the pain in her left arm was associated with lightheadedness.  She denies any shortness of breath, palpitations, orthopnea, or PND.  She  reports no prior episodes like this.  In the [facility] Emergency  Department, she was found to have a positive troponin with her third set of  point-of-care markers at a value of 0.10.  Her ECG demonstrated no notable  abnormalities.  Her cardiac risk factors include diabetes mellitus, obesity,  and family history of coronary disease.   PAST MEDICAL HISTORY:  1.  Diabetes mellitus. She is currently diet controlled, not taking any      insulin or oral agents.  Her last hemoglobin A1c several months ago was      5.3.  2.  Obesity.  3.  Hypertension.  4.  GERD.  5.   Arthritis.   MEDICATIONS:  1.  Klor-Con 20 mEq daily.  2.  Lasix 40 mg daily.  3.  Toprol XL 50 mg daily.  4.  Diovan 320 mg daily.  5.  Darvocet p.r.n. for arthritis in multiple joints.  6.  Aspirin 81 mg p.o. daily.   SOCIAL HISTORY:  The patient lives in [place] with her husband.  She has two  adult female children who are healthy, and she is not currently employed;  she retired and used to work as an Associate Professor for [employer].   She denies any tobacco use.  No alcohol, no drugs.   FAMILY HISTORY:  Notable for a brother who died of an MI at the age of 44.   REVIEW OF SYSTEMS:  Remarkable for eczema/psoriatic lesions on her right  arm/elbow.  CARDIOPULMONARY:  Review of Systems positive for chest pain and  presyncope and further delineated in the History of Present Illness.  MUSCULOSKELETAL:  Review of Systems is positive for arthritis in multiple  joints.  ENDOCRINE Review  of Systems is positive for diabetes mellitus.  All  other Review of Systems are negative.   PHYSICAL EXAMINATION:  VITAL SIGNS:  Temperature 99, blood pressure 140/54,  pulse 65, respiratory rate 18.  She was saturating anywhere between 97 and  99% on 2 liters.  GENERAL: She was in no apparent distress.  She is obese.  HEENT:  Exam is unremarkable.  NECK:  Supple.  JVP is slightly elevated at 10.  She had no audible carotid  bruits.  CARDIOVASCULAR:  Remarkable for a 2/6 systolic ejection murmur that radiates  to the axilla.  She had no audible S3 or other abnormalities.  LUNGS:  Clear to auscultation bilaterally.  SKIN:  Showed patches of dry skin on the right arm and elbow.  ABDOMEN:  Soft and nontender with good bowel sounds.  There is no  organomegaly, although her abdomen was obese and difficult to appreciate.  GU/RECTAL:  Exams not performed.  EXTREMITIES:  Showed trace edema with 2+ distal pulses.  NEUROLOGIC: Alert and oriented x3.  Cranial nerves II-XII grossly intact.  Normal sensation, and she  had normal cerebellar function by limited testing.   Her ECG showed a rate of 64, rhythm normal sinus, axis normal, intervals  203, QRS 91, corrected QTC 388.  There were no appreciable ST-T wave  changes, no evidence of hypertrophy, no Q waves.   Lab values are notable for hematocrit 41, white count 9.2, platelet count  194.  CHEM-7 showed a creatinine of 1, BUN 26, potassium 3.9, sodium 134,  bicarb 27, glucose 95.  GI panel was normal.  Point-of-care markers, first  two sets showed troponin I less than 0.05; third test showed a troponin I of  0.10.  Calcium was normal.   IMPRESSION:  A 76 year old female with no known prior coronary disease who  presents with a non-ST-elevation myocardial infarction.  She is currently  chest pain free.  Elevation of troponin positive.   Plan for treatment will include heparin, Integrilin, aspirin, atorvastatin  80, metoprolol 50 q.12 h. and nitroglycerin paste.  We will plan for a heart  catheterization on Monday.  I have explained and discussed the risks and  benefits with the patient, and she wishes to proceed, so that will be the  plan.           ______________________________  Anna Genre. Maisie Fus, M.D. [facility]     KLT/MEDQ  D:  07/21/2005  T:  07/21/2005  Job:  [id number]   cc:   [name], M.D.  Fax: [phone]

## 2011-01-09 ENCOUNTER — Telehealth: Payer: Self-pay | Admitting: *Deleted

## 2011-01-09 ENCOUNTER — Telehealth: Payer: Self-pay | Admitting: Cardiology

## 2011-01-09 NOTE — Telephone Encounter (Signed)
Pt has question re celebrex. Pt would like to talk to a nurse.

## 2011-01-09 NOTE — Telephone Encounter (Signed)
Pt calling with ? About celebrex--unable to get ahold of pt--no answer/no answering machine--nt

## 2011-01-09 NOTE — Telephone Encounter (Signed)
[name] calling to ask if she could take celebrex for arthritic pain--advised we want our pts to take tylenol rather than celebrex--pt agrees--nt

## 2011-01-25 ENCOUNTER — Telehealth: Payer: Self-pay | Admitting: Cardiology

## 2011-01-25 NOTE — Telephone Encounter (Signed)
Pt has kidney infection, is it ok for her to take cipro?

## 2011-01-25 NOTE — Telephone Encounter (Signed)
Patient states her PCP order Cipro for kidney infection. She would like to know if this medication would interact with The medications she is taken. I let patient know that according to the PDR, Cipro does not interact with Cipro. Patient states she is also she was thinking to call her Pharmacist to make sure.

## 2011-01-28 ENCOUNTER — Other Ambulatory Visit: Payer: Self-pay | Admitting: *Deleted

## 2011-01-28 MED ORDER — VALSARTAN 320 MG PO TABS: 320.0000 mg | ORAL_TABLET | Freq: Every day | ORAL | Status: DC

## 2011-01-30 ENCOUNTER — Encounter: Payer: Medicare Other | Admitting: *Deleted

## 2011-01-31 ENCOUNTER — Ambulatory Visit (INDEPENDENT_AMBULATORY_CARE_PROVIDER_SITE_OTHER): Payer: Medicare Other | Admitting: *Deleted

## 2011-01-31 DIAGNOSIS — Z8679 Personal history of other diseases of the circulatory system: Secondary | ICD-10-CM

## 2011-01-31 DIAGNOSIS — Z7901 Long term (current) use of anticoagulants: Secondary | ICD-10-CM

## 2011-01-31 DIAGNOSIS — I4891 Unspecified atrial fibrillation: Secondary | ICD-10-CM

## 2011-02-01 ENCOUNTER — Telehealth: Payer: Self-pay | Admitting: Cardiology

## 2011-02-01 NOTE — Telephone Encounter (Signed)
Pt having trouble w/knee pain she is on vicodin but it doesn't always help she is wanted to know if she can take glucosamine or some type of anti-infammatory, will discuss w/Dr Myrtis Ser and call her back

## 2011-02-01 NOTE — Telephone Encounter (Signed)
Pt having inflammation in her knee, is there anything safe for her to take due to her condition, her pcp can write her an rx if she knows what to tell him she needs

## 2011-02-04 NOTE — Telephone Encounter (Signed)
Glucosamine is okay.  Small doses of arthritis meds on a p.r.n. Basis are okay.  Patient canceled the nuclear study we had arranged

## 2011-02-05 NOTE — Telephone Encounter (Signed)
NA x 8 rings

## 2011-02-06 NOTE — Telephone Encounter (Signed)
Pt aware.

## 2011-02-19 ENCOUNTER — Other Ambulatory Visit: Payer: Self-pay | Admitting: Physician Assistant

## 2011-02-21 NOTE — Telephone Encounter (Signed)
This is a coumadin refill

## 2011-02-21 NOTE — Telephone Encounter (Signed)
This is a coumadin refill 

## 2011-02-26 ENCOUNTER — Other Ambulatory Visit: Payer: Self-pay

## 2011-02-26 MED ORDER — WARFARIN SODIUM 5 MG PO TABS: 5.0000 mg | ORAL_TABLET | ORAL | Status: DC

## 2011-02-27 ENCOUNTER — Encounter: Payer: Medicare Other | Admitting: *Deleted

## 2011-03-01 ENCOUNTER — Encounter: Payer: Medicare Other | Admitting: *Deleted

## 2011-03-06 ENCOUNTER — Ambulatory Visit (INDEPENDENT_AMBULATORY_CARE_PROVIDER_SITE_OTHER): Payer: Medicare Other | Admitting: *Deleted

## 2011-03-06 DIAGNOSIS — I4891 Unspecified atrial fibrillation: Secondary | ICD-10-CM

## 2011-03-06 DIAGNOSIS — Z8679 Personal history of other diseases of the circulatory system: Secondary | ICD-10-CM

## 2011-03-06 DIAGNOSIS — Z7901 Long term (current) use of anticoagulants: Secondary | ICD-10-CM

## 2011-03-07 ENCOUNTER — Encounter (HOSPITAL_COMMUNITY): Payer: Self-pay

## 2011-03-07 ENCOUNTER — Other Ambulatory Visit (HOSPITAL_COMMUNITY): Payer: Self-pay | Admitting: Family Medicine

## 2011-03-07 ENCOUNTER — Ambulatory Visit (HOSPITAL_COMMUNITY)
Admission: RE | Admit: 2011-03-07 | Discharge: 2011-03-07 | Disposition: A | Discharge status: Home / Self Care | Payer: Medicare Other | Source: Ambulatory Visit | Attending: Family Medicine | Admitting: Family Medicine

## 2011-03-07 DIAGNOSIS — R509 Fever, unspecified: Secondary | ICD-10-CM | POA: Insufficient documentation

## 2011-03-07 DIAGNOSIS — R0602 Shortness of breath: Secondary | ICD-10-CM | POA: Insufficient documentation

## 2011-03-07 DIAGNOSIS — J209 Acute bronchitis, unspecified: Secondary | ICD-10-CM

## 2011-03-07 DIAGNOSIS — R059 Cough, unspecified: Secondary | ICD-10-CM | POA: Insufficient documentation

## 2011-03-07 DIAGNOSIS — R05 Cough: Secondary | ICD-10-CM | POA: Insufficient documentation

## 2011-03-11 ENCOUNTER — Telehealth: Payer: Self-pay | Admitting: Cardiology

## 2011-03-11 DIAGNOSIS — R0602 Shortness of breath: Secondary | ICD-10-CM

## 2011-03-11 NOTE — Telephone Encounter (Signed)
Per pt call, pt visited PCP Dr. Renette Butters and had chest x-ray, PCP found a lot of congestion/infection in lungs. Pt said PCP said cardiologists should see x-rays. Pt was calling to see if Dr. Myrtis Ser has received and viewed the x-rays. Please return pt call to advise.

## 2011-03-12 NOTE — Telephone Encounter (Signed)
Xray from 7/19 in the system reviewed by Tereso Newcomer he recommends she have an echo to further evauate, called pt and Left message to call back

## 2011-03-13 ENCOUNTER — Encounter: Payer: Self-pay | Admitting: Cardiology

## 2011-03-13 NOTE — Telephone Encounter (Signed)
Have Dr. Myrtis Ser received xray from [facility].

## 2011-03-13 NOTE — Telephone Encounter (Signed)
Pt. return  Phone call regarding Chest xray on 03/07/11. Md's recommendations given. An 2-D echo was ordered . Patient is  aware that the scheduler will call her with date and time of test. Patient verbalized understanding.

## 2011-03-14 ENCOUNTER — Ambulatory Visit (INDEPENDENT_AMBULATORY_CARE_PROVIDER_SITE_OTHER): Payer: Medicare Other | Admitting: *Deleted

## 2011-03-14 DIAGNOSIS — Z8679 Personal history of other diseases of the circulatory system: Secondary | ICD-10-CM

## 2011-03-14 DIAGNOSIS — I4891 Unspecified atrial fibrillation: Secondary | ICD-10-CM

## 2011-03-14 DIAGNOSIS — Z7901 Long term (current) use of anticoagulants: Secondary | ICD-10-CM

## 2011-03-19 ENCOUNTER — Ambulatory Visit (HOSPITAL_COMMUNITY): Payer: Medicare Other | Attending: Physician Assistant | Admitting: Radiology

## 2011-03-19 DIAGNOSIS — I10 Essential (primary) hypertension: Secondary | ICD-10-CM | POA: Insufficient documentation

## 2011-03-19 DIAGNOSIS — R059 Cough, unspecified: Secondary | ICD-10-CM | POA: Insufficient documentation

## 2011-03-19 DIAGNOSIS — R05 Cough: Secondary | ICD-10-CM | POA: Insufficient documentation

## 2011-03-19 DIAGNOSIS — E785 Hyperlipidemia, unspecified: Secondary | ICD-10-CM | POA: Insufficient documentation

## 2011-03-19 DIAGNOSIS — R0602 Shortness of breath: Secondary | ICD-10-CM | POA: Insufficient documentation

## 2011-03-19 DIAGNOSIS — I4891 Unspecified atrial fibrillation: Secondary | ICD-10-CM | POA: Insufficient documentation

## 2011-03-22 ENCOUNTER — Encounter: Payer: Self-pay | Admitting: Cardiology

## 2011-03-22 NOTE — Progress Notes (Signed)
HPI The patient had been seen by me in the office in February, 2012.  Carotid Doppler and a nuclear stress study were ordered.  The carotids revealed no significant disease.  It is my understanding that the nuclear study was canceled.  Recently the patient has had a chest x-ray by her primary physician.  There is a reading with the question of pulmonary hypertension.  Two-dimensional echo has been done.  The patient has good left ventricular function.  There is dilatation of the right ventricle and right ventricular dysfunction.  There is mild tricuspid regurgitation.  There is significant pulmonary hypertension with a PA pressure is high as 80 mmHg.  At this point it seems unlikely that the pulmonary hypertension is secondary to cardiac disease.  Further evaluation of her pulmonary status and pulmonary hypertension will be indicated.  I will arrange for followup with me for cardiac and we will look into pulmonary evaluation also. No Known Allergies  Current Outpatient Prescriptions  Medication Sig Dispense Refill  . ALPRAZolam (XANAX) 0.5 MG tablet Take 0.75 mg by mouth at bedtime as needed.        [name] aspirin 81 MG tablet Take 81 mg by mouth daily.        [name] atorvastatin (LIPITOR) 80 MG tablet Take 40 mg by mouth daily.        . Biotin 1000 MCG tablet Take 1,000 mcg by mouth daily.        . Cholecalciferol (VITAMIN D3) 1000 UNITS CAPS Take 1 tablet by mouth daily.        . Coenzyme Q10 (COQ10) 50 MG CAPS Take 2 tablets by mouth daily.        . famotidine (PEPCID) 20 MG tablet Take 20 mg by mouth as needed.        . fexofenadine (ALLEGRA) 180 MG tablet Take 180 mg by mouth as needed.        . furosemide (LASIX) 40 MG tablet 1 tab every other day for 1 week...then increase to 1 1/2 tab every other day as per Tereso Newcomer, PA-C       . HYDROcodone-acetaminophen (VICODIN) 5-500 MG per tablet Take 0.5 tablets by mouth every 4 (four) hours as needed.        . metoprolol (TOPROL-XL) 100 MG 24 hr tablet  Take 50 mg by mouth daily.        . Multiple Vitamin (MULTIVITAMIN) capsule Take 1 capsule by mouth daily.        . nitroGLYCERIN (NITROSTAT) 0.4 MG SL tablet Place 0.4 mg under the tongue every 5 (five) minutes as needed.        . Omega-3 Fatty Acids (FISH OIL) 1000 MG CAPS Take 1 capsule by mouth 2 (two) times daily.        [name] omeprazole (PRILOSEC) 20 MG capsule Take 40 mg by mouth daily.        . potassium chloride SA (K-DUR,KLOR-CON) 20 MEQ tablet Take 20 mEq by mouth daily.        . valsartan (DIOVAN) 320 MG tablet Take 1 tablet (320 mg total) by mouth daily.  30 tablet  11  . warfarin (COUMADIN) 5 MG tablet Take 1 tablet (5 mg total) by mouth as directed.  45 tablet  2    History   Social History  . Marital Status: Married    Spouse Name: N/A    Number of Children: N/A  . Years of Education: N/A   Occupational History  . retired  Social History Main Topics  . Smoking status: Never Smoker   . Smokeless tobacco: Not on file  . Alcohol Use: No  . Drug Use: Not on file  . Sexually Active: Not on file   Other Topics Concern  . Not on file   Social History Narrative  . No narrative on file    Family History  Problem Relation Age of Onset  . Heart attack Mother   . Coronary artery disease Father   . Stroke Father   . Heart attack Brother   . Heart failure Brother   . COPD Brother     Past Medical History  Diagnosis Date  . Hypertension   . Dyslipidemia   . Atrial fibrillation   . CAD (coronary artery disease)   . Degenerative arthritis of right knee   . Morbid obesity   . Psoriasis   . Carotid bruit     Past Surgical History  Procedure Date  . Cholecystectomy   . Right ankle surgery     ROS   PHYSICAL EXAM  There were no vitals filed for this visit.  EKG  ASSESSMENT & PLAN

## 2011-03-29 ENCOUNTER — Ambulatory Visit: Payer: Medicare Other | Admitting: Adult Health

## 2011-04-03 ENCOUNTER — Ambulatory Visit (INDEPENDENT_AMBULATORY_CARE_PROVIDER_SITE_OTHER): Payer: Medicare Other | Admitting: *Deleted

## 2011-04-03 DIAGNOSIS — Z8679 Personal history of other diseases of the circulatory system: Secondary | ICD-10-CM

## 2011-04-03 DIAGNOSIS — Z7901 Long term (current) use of anticoagulants: Secondary | ICD-10-CM

## 2011-04-03 DIAGNOSIS — I4891 Unspecified atrial fibrillation: Secondary | ICD-10-CM

## 2011-04-09 ENCOUNTER — Encounter: Payer: Self-pay | Admitting: Cardiology

## 2011-04-12 ENCOUNTER — Ambulatory Visit: Payer: Medicare Other | Admitting: Cardiology

## 2011-04-12 ENCOUNTER — Other Ambulatory Visit: Payer: Self-pay | Admitting: Cardiology

## 2011-04-12 DIAGNOSIS — I272 Pulmonary hypertension, unspecified: Secondary | ICD-10-CM

## 2011-04-15 ENCOUNTER — Telehealth: Payer: Self-pay | Admitting: Internal Medicine

## 2011-04-15 NOTE — Telephone Encounter (Signed)
Please advise Dr. Kriste Basque if you would like to accept her as a new pt  Carver Fila, CMA

## 2011-04-16 NOTE — Telephone Encounter (Signed)
ATC unavailable. Not able to leave VM. WCB

## 2011-04-16 NOTE — Telephone Encounter (Signed)
Spoke with pt and notified of recs per SN Pt verbalized understanding  

## 2011-04-16 NOTE — Telephone Encounter (Signed)
Per SN---very sorry but not taking new pts at this time.  We can refer her to primary care.  thanks

## 2011-04-17 ENCOUNTER — Encounter: Payer: Medicare Other | Admitting: *Deleted

## 2011-04-18 ENCOUNTER — Ambulatory Visit (INDEPENDENT_AMBULATORY_CARE_PROVIDER_SITE_OTHER): Payer: Medicare Other | Admitting: Internal Medicine

## 2011-04-18 ENCOUNTER — Encounter: Payer: Self-pay | Admitting: Internal Medicine

## 2011-04-18 VITALS — BP 132/80 | HR 45 | Temp 97.4°F | Ht 65.0 in | Wt 245.0 lb

## 2011-04-18 DIAGNOSIS — I2789 Other specified pulmonary heart diseases: Secondary | ICD-10-CM

## 2011-04-18 DIAGNOSIS — E663 Overweight: Secondary | ICD-10-CM

## 2011-04-18 DIAGNOSIS — R0609 Other forms of dyspnea: Secondary | ICD-10-CM

## 2011-04-18 DIAGNOSIS — I2721 Secondary pulmonary arterial hypertension: Secondary | ICD-10-CM

## 2011-04-18 DIAGNOSIS — R0600 Dyspnea, unspecified: Secondary | ICD-10-CM

## 2011-04-18 NOTE — Patient Instructions (Addendum)
Please see patient coordinator before you leave today  to schedule overnight pulse 02 monitor  and v/q scan  Please schedule a follow up first available  office visit    with PFT"s on return - this should be within the next 4 weeks  Late add needs tsh/ bmet/ cbc/ bnp on return

## 2011-04-18 NOTE — Progress Notes (Signed)
  Subjective:    Patient ID: [name], female    DOB: 20-Feb-1935, 76 y.o.   MRN: [id number]  HPI  43 yowf with minimal smoking hx referred by Dr Myrtis Ser for [facility]  In August 2012   04/19/2011 Initial pulmonary office eval cc indolent onset slt progressively worsening  doe x  Maybe a year assoc with Minimal dry cough x 6 wks assoc with progressive doe to across the house now  Assoc wtih chronic leg swelling but  no worse lately Peak wt 280 3 years ago improving now.  Sleeping ok without nocturnal  or early am exac of resp c/o's - minimal dysphagia  Pt denies any significant sore throat,   itching, sneezing,  nasal congestion or excess/ purulent secretions,  fever, chills, sweats, unintended wt loss, pleuritic or exertional cp, hempoptysis, orthopnea pnd     Also denies any obvious fluctuation of symptoms with weather or environmental changes or other aggravating or alleviating factors.     Review of Systems  Constitutional: Positive for appetite change. Negative for fever, chills and unexpected weight change.  HENT: Positive for congestion and trouble swallowing. Negative for ear pain, nosebleeds, sore throat, rhinorrhea, sneezing, dental problem, voice change, postnasal drip and sinus pressure.   Eyes: Negative for visual disturbance.  Respiratory: Positive for shortness of breath. Negative for cough and choking.   Cardiovascular: Negative for chest pain and leg swelling.  Gastrointestinal: Negative for vomiting, abdominal pain and diarrhea.  Genitourinary: Negative for difficulty urinating.  Musculoskeletal: Positive for arthralgias.  Skin: Negative for rash.  Neurological: Negative for tremors, syncope and headaches.  Hematological: Does not bruise/bleed easily.  Psychiatric/Behavioral: The patient is nervous/anxious.        Objective:   Physical Exam  [name]     Wt 245 04/18/11  HEENT: nl dentition, turbinates, and orophanx. Nl external ear canals without cough  reflex   NECK :  without JVD/Nodes/TM/ nl carotid upstrokes bilaterally   LUNGS: no acc muscle use, clear to A and P bilaterally without cough on insp or exp maneuvers   CV:  RRR  no s3 or murmur - definite  increase in P2, mod  edema with TEDS on  ABD:  Massively obese but soft and nontender with nl excursion in the supine position. No bruits or organomegaly, bowel sounds nl  MS:  warm without deformities, calf tenderness, cyanosis or clubbing  SKIN: warm and dry without lesions    NEURO:  alert, approp, no deficits    Echo 03/19/11 - Right ventricle: The cavity size was dilated. Systolic function was mildly reduced. - Right atrium: The atrium was dilated. - Atrial septum: No defect or patent foramen ovale was identified. - Tricuspid valve: Severe regurgitation. - Pulmonary arteries: PA peak pressure: 93mm Hg (S).        Assessment & Plan:

## 2011-04-19 ENCOUNTER — Encounter: Payer: Self-pay | Admitting: Internal Medicine

## 2011-04-19 DIAGNOSIS — I272 Pulmonary hypertension, unspecified: Secondary | ICD-10-CM | POA: Insufficient documentation

## 2011-04-19 NOTE — Assessment & Plan Note (Addendum)
Severe PAH by Echo 03/07/11 with no obvious L heart issue  HC03 34 on last bmet 10/2010 so this is likely ohs and maybe osa related secondary pulmonary hbp but need to exclude TEPAH with v/q and get pft's on return.  Already anticoagulated so other than 02 and wt loss there may not be much to offer here.

## 2011-04-19 NOTE — Assessment & Plan Note (Signed)
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

## 2011-04-24 ENCOUNTER — Ambulatory Visit (INDEPENDENT_AMBULATORY_CARE_PROVIDER_SITE_OTHER): Payer: Medicare Other | Admitting: *Deleted

## 2011-04-24 ENCOUNTER — Encounter (HOSPITAL_COMMUNITY)
Admission: RE | Admit: 2011-04-24 | Discharge: 2011-04-24 | Disposition: A | Discharge status: Home / Self Care | Payer: Medicare Other | Source: Ambulatory Visit | Attending: Internal Medicine | Admitting: Internal Medicine

## 2011-04-24 DIAGNOSIS — I4891 Unspecified atrial fibrillation: Secondary | ICD-10-CM

## 2011-04-24 DIAGNOSIS — R0600 Dyspnea, unspecified: Secondary | ICD-10-CM

## 2011-04-24 DIAGNOSIS — I517 Cardiomegaly: Secondary | ICD-10-CM | POA: Insufficient documentation

## 2011-04-24 DIAGNOSIS — R0602 Shortness of breath: Secondary | ICD-10-CM | POA: Insufficient documentation

## 2011-04-24 DIAGNOSIS — Z8679 Personal history of other diseases of the circulatory system: Secondary | ICD-10-CM

## 2011-04-24 DIAGNOSIS — Z7901 Long term (current) use of anticoagulants: Secondary | ICD-10-CM

## 2011-04-24 LAB — POCT INR: INR: 2

## 2011-04-24 MED ORDER — TECHNETIUM TO 99M ALBUMIN AGGREGATED
6.0000 | Freq: Once | INTRAVENOUS | Status: AC | PRN
  Administered 2011-04-24: 6 via INTRAVENOUS

## 2011-04-24 MED ORDER — XENON XE 133 GAS
10.0000 | GAS_FOR_INHALATION | Freq: Once | RESPIRATORY_TRACT | Status: AC | PRN
  Administered 2011-04-24: 10 via RESPIRATORY_TRACT

## 2011-04-25 NOTE — Progress Notes (Signed)
Quick Note:  Spoke with pt and notified of results per Dr. Wert. Pt verbalized understanding and denied any questions.  ______ 

## 2011-04-26 ENCOUNTER — Encounter: Payer: Self-pay | Admitting: Internal Medicine

## 2011-04-26 NOTE — Progress Notes (Signed)
Quick Note:  Spoke with pt and notified of results per Dr. Wert. Pt verbalized understanding and denied any questions.  ______ 

## 2011-04-29 ENCOUNTER — Encounter: Payer: Self-pay | Admitting: Cardiology

## 2011-05-02 ENCOUNTER — Encounter: Payer: Self-pay | Admitting: Cardiology

## 2011-05-02 ENCOUNTER — Ambulatory Visit (INDEPENDENT_AMBULATORY_CARE_PROVIDER_SITE_OTHER): Payer: Medicare Other | Admitting: Cardiology

## 2011-05-02 DIAGNOSIS — I10 Essential (primary) hypertension: Secondary | ICD-10-CM

## 2011-05-02 DIAGNOSIS — I071 Rheumatic tricuspid insufficiency: Secondary | ICD-10-CM

## 2011-05-02 DIAGNOSIS — R0989 Other specified symptoms and signs involving the circulatory and respiratory systems: Secondary | ICD-10-CM | POA: Insufficient documentation

## 2011-05-02 DIAGNOSIS — I272 Pulmonary hypertension, unspecified: Secondary | ICD-10-CM

## 2011-05-02 DIAGNOSIS — I4891 Unspecified atrial fibrillation: Secondary | ICD-10-CM

## 2011-05-02 DIAGNOSIS — I079 Rheumatic tricuspid valve disease, unspecified: Secondary | ICD-10-CM

## 2011-05-02 DIAGNOSIS — E785 Hyperlipidemia, unspecified: Secondary | ICD-10-CM | POA: Insufficient documentation

## 2011-05-02 DIAGNOSIS — Z7901 Long term (current) use of anticoagulants: Secondary | ICD-10-CM | POA: Insufficient documentation

## 2011-05-02 DIAGNOSIS — I519 Heart disease, unspecified: Secondary | ICD-10-CM | POA: Insufficient documentation

## 2011-05-02 DIAGNOSIS — I340 Nonrheumatic mitral (valve) insufficiency: Secondary | ICD-10-CM | POA: Insufficient documentation

## 2011-05-02 DIAGNOSIS — I2510 Atherosclerotic heart disease of native coronary artery without angina pectoris: Secondary | ICD-10-CM | POA: Insufficient documentation

## 2011-05-02 DIAGNOSIS — R9430 Abnormal result of cardiovascular function study, unspecified: Secondary | ICD-10-CM | POA: Insufficient documentation

## 2011-05-02 DIAGNOSIS — L409 Psoriasis, unspecified: Secondary | ICD-10-CM | POA: Insufficient documentation

## 2011-05-02 DIAGNOSIS — I2789 Other specified pulmonary heart diseases: Secondary | ICD-10-CM

## 2011-05-02 MED ORDER — METOPROLOL SUCCINATE ER 25 MG PO TB24: 25.0000 mg | ORAL_TABLET | Freq: Every day | ORAL | Status: DC

## 2011-05-02 NOTE — Assessment & Plan Note (Signed)
I am quite concerned about her severe pulmonary hypertension.  Workup is being done by the pulmonary group.  At this point I have no other suggestions concerning general cardiology for this.

## 2011-05-02 NOTE — Assessment & Plan Note (Signed)
She has right ventricular dysfunction and tricuspid regurgitation.  It would appear that these are related to her pulmonary hypertension.

## 2011-05-02 NOTE — Assessment & Plan Note (Signed)
The patient's atrial fibrillation rate is on the slow side.  I decided to cut her metoprolol dose down to 25 mg daily.

## 2011-05-02 NOTE — Patient Instructions (Signed)
Your physician wants you to follow-up in: 6 months.   You will receive a reminder letter in the mail two months in advance. If you don't receive a letter, please call our office to schedule the follow-up appointment.  Your physician has recommended you make the following change in your medication: Decrease your Toprol XL to 25mg  daily.

## 2011-05-02 NOTE — Progress Notes (Signed)
HPI The patient is seen for followup coronary artery disease.  He received a stent in the past. She did have a nuclear stress study in February, 2012.  There was question of a small area of inferolateral scar or ischemia, but attenuation could not be ruled out.  This was a low risk study.  She also has atrial fibrillation.  Her rate has been controlled.  She is on Coumadin.  More recently there was an abnormal chest x-ray suggesting pulmonary hypertension.  A two-dimensional echo was done showing good left ventricular function.  However she has severe pulmonary hypertension.  I have referred her to the pulmonary team.  She has seen Dr. Sherene Sires who has started the workup.  Pulmonary function studies and studies to rule out pulmonary emboli are being done.  She is to see him back for followup.  She is here today and clinically stable.  As part of today's evaluation I have reviewed my old records and try to update the current electronic medical record.  Have also reviewed the echo from July 2 012.  I reviewed the notes from the pulmonary team.  I tried to determine the timing of the development of her pulmonary hypertension.  I have been unable to do so. No Known Allergies  Current Outpatient Prescriptions  Medication Sig Dispense Refill  . ALPRAZolam (XANAX) 0.5 MG tablet Take 0.75 mg by mouth at bedtime as needed.        [name] aspirin 81 MG tablet Take 81 mg by mouth daily.        [name] atorvastatin (LIPITOR) 80 MG tablet Take 40 mg by mouth daily.        . Biotin 1000 MCG tablet Take 1,000 mcg by mouth daily.        . Cholecalciferol (VITAMIN D3) 1000 UNITS CAPS Take 1 tablet by mouth daily.        . Coenzyme Q10 (COQ10) 50 MG CAPS Take 2 tablets by mouth daily.        . famotidine (PEPCID) 20 MG tablet Take 20 mg by mouth as needed.        . fexofenadine (ALLEGRA) 180 MG tablet Take 180 mg by mouth as needed.        . furosemide (LASIX) 40 MG tablet 1/2 tablet daily      . HYDROcodone-acetaminophen (VICODIN)  5-500 MG per tablet Take 0.5 tablets by mouth every 4 (four) hours as needed.        . metoprolol (TOPROL-XL) 25 MG 24 hr tablet Take 1 tablet (25 mg total) by mouth daily.  30 tablet  6  . Multiple Vitamin (MULTIVITAMIN) capsule Take 1 capsule by mouth daily.        . nitroGLYCERIN (NITROSTAT) 0.4 MG SL tablet Place 0.4 mg under the tongue every 5 (five) minutes as needed.        [name] omeprazole (PRILOSEC) 20 MG capsule Take 40 mg by mouth daily.        . potassium chloride SA (K-DUR,KLOR-CON) 20 MEQ tablet Take 20 mEq by mouth daily.        . valsartan (DIOVAN) 320 MG tablet Take 1 tablet (320 mg total) by mouth daily.  30 tablet  11  . warfarin (COUMADIN) 5 MG tablet Take 1 tablet (5 mg total) by mouth as directed.  45 tablet  2  . Omega-3 Fatty Acids (FISH OIL) 1000 MG CAPS Take 1 capsule by mouth 2 (two) times daily.  History   Social History  . Marital Status: Married    Spouse Name: N/A    Number of Children: 2  . Years of Education: N/A   Occupational History  . retired    Social History Main Topics  . Smoking status: Former Smoker -- 0.3 packs/day for .5 years    Types: Cigarettes    Quit date: 08/19/1998  . Smokeless tobacco: Never Used  . Alcohol Use: No  . Drug Use: No  . Sexually Active: Not on file   Other Topics Concern  . Not on file   Social History Narrative  . No narrative on file    Family History  Problem Relation Age of Onset  . Heart attack Mother   . Coronary artery disease Father   . Stroke Father   . Heart attack Brother   . Heart failure Brother   . COPD Brother   . Emphysema Sister     smoked  . Emphysema Brother     smoked    Past Medical History  Diagnosis Date  . Hypertension   . Dyslipidemia   . Atrial fibrillation   . CAD (coronary artery disease)     Bare-metal stent, December, 2006, melena or other disease  . Degenerative arthritis of right knee   . Morbid obesity   . Psoriasis   . Carotid bruit     doppler, 2012,  no significant abnormality  . Ejection fraction     EF 55-60%, echo, August, 2012  . Mitral regurgitation     Mild, echo, August, 2012  . Right ventricular dysfunction     Mild dilatation of the right ventricle, mild decreased right ventricular function, echo, August, 2012  . Tricuspid regurgitation     Severe, echo, August of 2012  . Pulmonary hypertension     Severe, 93 mmHg, echo, August, 2012 and  . Warfarin anticoagulation     Past Surgical History  Procedure Date  . Cholecystectomy   . Right ankle surgery     ROS  Patient denies fever, chills, headache, sweats, rash, change in vision, change in hearing, chest pain, nausea vomiting, urinary symptoms.  All other systems are reviewed and are negative.  P Exam Patient is overweight.  She is here with a family member.  She is oriented to person time and place.  Affect is normal.  Head is atraumatic.  There is no xanthelasma.  There is no jugular venous distention.  Lungs are clear.  Respiratory effort is unlabored.  Cardiac exam reveals S1-S2.  No significant murmurs.  The abdomen is soft.  There is no significant peripheral edema.  There are no musculoskeletal deformities.  There no skin rashes. Filed Vitals:   05/02/11 1147  BP: 143/77  Pulse: 47  Height: 5\' 5"  (1.651 m)  Weight: 250 lb (113.399 kg)    EKG  EKG is done today and reviewed by me.  There is atrial fibrillation with a slow ventricular rate.  ASSESSMENT & PLAN

## 2011-05-02 NOTE — Assessment & Plan Note (Signed)
Coronary artery disease is stable.  No further workup at this time.

## 2011-05-20 ENCOUNTER — Ambulatory Visit (INDEPENDENT_AMBULATORY_CARE_PROVIDER_SITE_OTHER): Payer: Medicare Other | Admitting: Internal Medicine

## 2011-05-20 ENCOUNTER — Encounter: Payer: Self-pay | Admitting: Internal Medicine

## 2011-05-20 VITALS — BP 124/78 | HR 63 | Temp 97.6°F | Ht 65.0 in | Wt 255.0 lb

## 2011-05-20 DIAGNOSIS — R0609 Other forms of dyspnea: Secondary | ICD-10-CM

## 2011-05-20 DIAGNOSIS — R0600 Dyspnea, unspecified: Secondary | ICD-10-CM

## 2011-05-20 DIAGNOSIS — I2721 Secondary pulmonary arterial hypertension: Secondary | ICD-10-CM

## 2011-05-20 DIAGNOSIS — E663 Overweight: Secondary | ICD-10-CM

## 2011-05-20 DIAGNOSIS — I2789 Other specified pulmonary heart diseases: Secondary | ICD-10-CM

## 2011-05-20 LAB — PULMONARY FUNCTION TEST

## 2011-05-20 NOTE — Patient Instructions (Addendum)
We will call you with your overnight oxygen levels and have the test repeated on 02 if you qualify and if that doesn't correct the problem you will need a formal sleep study scheduled with follow up with one of our sleep doctors  In the meantime, Weight control is simply a matter of calorie balance which needs to be tilted in your favor by eating less and exercising more.  To get the most out of exercise, you need to be continuously aware that you are short of breath, but never out of breath, for 30 minutes daily. As you improve, it will actually be easier for you to do the same amount of exercise  in  30 minutes so always push to the level where you are short of breath.  If this does not result in gradual weight reduction then I strongly recommend you see a nutritionist with a food diary x 2 weeks so that we can work out a negative calorie balance which is universally effective in steady weight loss programs.  Think of your calorie balance like you do your bank account where in this case you want the balance to go down so you must take in less calories than you burn up.  It's just that simple:  Hard to do, but easy to understand.  Good luck!   Late add needs tsh/ bmet/ cbc/ bnp on return

## 2011-05-20 NOTE — Progress Notes (Signed)
  Subjective:    Patient ID: [name], female    DOB: 30-Dec-1934, 76 y.o.   MRN: [id number]  HPI  21 yowf with minimal smoking hx referred by Dr Myrtis Ser for [facility]  In August 2012   04/19/2011 Initial pulmonary office eval cc indolent onset slt progressively worsening  doe x  Maybe a year assoc with Minimal dry cough x 6 wks assoc with progressive doe to across the house now  Assoc wtih chronic leg swelling but  no worse lately Peak wt 280 3 years ago improving now.  Sleeping ok without nocturnal  or early am exac of resp c/o's - minimal dysphagia rec Please see patient coordinator  For  v/q scan> neg for pe  Please schedule a follow up first available  office visit    with PFT"s on return - this should be within the next 4 weeks      05/20/2011 f/u ov/Faron Tudisco cc no change doe but able to do stepper x 25-30 min.  Sleeps right side down but horizontal, no cough or sign leg swelling over baslein.   Pt denies any significant sore throat,   itching, sneezing,  nasal congestion or excess/ purulent secretions,  fever, chills, sweats, unintended wt loss, pleuritic or exertional cp, hempoptysis, orthopnea pnd     Also denies any obvious fluctuation of symptoms with weather or environmental changes or other aggravating or alleviating factors.           Objective:   Physical Exam  [name]     Wt 245 04/18/11  >  255 05/20/2011   HEENT: nl dentition, turbinates, and orophanx. Nl external ear canals without cough reflex   NECK :  without JVD/Nodes/TM/ nl carotid upstrokes bilaterally   LUNGS: no acc muscle use, clear to A and P bilaterally without cough on insp or exp maneuvers   CV:  RRR  no s3 or murmur - definite  increase in P2, mod  edema with TEDS on  ABD:  Massively obese but soft and nontender with nl excursion in the supine position. No bruits or organomegaly, bowel sounds nl  MS:  warm without deformities, calf tenderness, cyanosis or clubbing      Echo 03/19/11  - Right ventricle: The cavity size was dilated. Systolic function was mildly reduced. - Right atrium: The atrium was dilated. - Atrial septum: No defect or patent foramen ovale was identified. - Tricuspid valve: Severe regurgitation. - Pulmonary arteries: PA peak pressure: 93mm Hg (S).        Assessment & Plan:

## 2011-05-20 NOTE — Progress Notes (Signed)
PFT done today. 

## 2011-05-21 ENCOUNTER — Encounter: Payer: Self-pay | Admitting: Internal Medicine

## 2011-05-22 ENCOUNTER — Ambulatory Visit (INDEPENDENT_AMBULATORY_CARE_PROVIDER_SITE_OTHER): Payer: Medicare Other | Admitting: *Deleted

## 2011-05-22 ENCOUNTER — Telehealth: Payer: Self-pay | Admitting: *Deleted

## 2011-05-22 ENCOUNTER — Encounter: Payer: Self-pay | Admitting: Internal Medicine

## 2011-05-22 DIAGNOSIS — I272 Pulmonary hypertension, unspecified: Secondary | ICD-10-CM

## 2011-05-22 DIAGNOSIS — Z7901 Long term (current) use of anticoagulants: Secondary | ICD-10-CM

## 2011-05-22 DIAGNOSIS — I2510 Atherosclerotic heart disease of native coronary artery without angina pectoris: Secondary | ICD-10-CM

## 2011-05-22 DIAGNOSIS — Z8679 Personal history of other diseases of the circulatory system: Secondary | ICD-10-CM

## 2011-05-22 DIAGNOSIS — E663 Overweight: Secondary | ICD-10-CM

## 2011-05-22 DIAGNOSIS — R0600 Dyspnea, unspecified: Secondary | ICD-10-CM

## 2011-05-22 DIAGNOSIS — I4891 Unspecified atrial fibrillation: Secondary | ICD-10-CM

## 2011-05-22 LAB — POCT INR: INR: 1.7

## 2011-05-22 NOTE — Telephone Encounter (Signed)
Message copied by Christen Butter on Wed May 22, 2011  5:29 PM ------      Message from: Sandrea Hughs B      Created: Wed May 22, 2011  6:35 AM       Call dr  Higinio Roger office to see if he did the labs I listed in the instruction section of last ov and if not we need to get them on next ov with [place]

## 2011-05-22 NOTE — Telephone Encounter (Signed)
Called Dr Phillips Odor and the office is closed, [facility] tomorrow.

## 2011-05-22 NOTE — Assessment & Plan Note (Signed)
Reviewed calorie balance issues, next step is formal nutrition eval

## 2011-05-22 NOTE — Assessment & Plan Note (Signed)
Def severe desats on overnight study need to be repeated on o2 then fromal PSG if not eliminated - most likely has ohs and osa assoc PAH, no evidence PE or cardiac shunt.  I had an extended discussion with the patient today lasting 15 to 20 minutes of a 25 minute visit on the following issues:  Limited options for rx other than wt loss which is critical.  No indication to refer to Dr Delton Coombes until this part of the w/u is complete and we have addressed all the potentially reversible mechs.

## 2011-05-24 ENCOUNTER — Encounter: Payer: Self-pay | Admitting: Internal Medicine

## 2011-05-24 NOTE — Telephone Encounter (Signed)
Dr Sherene Sires, I called and spoke with Dr Lamar Blinks nurse and she states that pt has not had any labs done there this year besides UA.

## 2011-05-24 NOTE — Telephone Encounter (Signed)
Needs the labs listed in the last ov instruction done at her earliest convenience to  Complete the w/u

## 2011-05-24 NOTE — Telephone Encounter (Signed)
Spoke with pt and she states will come in next wk for labs. I have placed the orders. I also informed her ONO results looked okay on 2 lpm. Pt verbalized understanding.

## 2011-05-28 ENCOUNTER — Encounter: Payer: Self-pay | Admitting: Internal Medicine

## 2011-05-28 ENCOUNTER — Telehealth: Payer: Self-pay | Admitting: Internal Medicine

## 2011-05-28 NOTE — Telephone Encounter (Signed)
Spoke with pt and notified ONO on 2 lpm was good and should cont using o2 at hs. Pt verbalized understanding of this and denied any other questions.

## 2011-05-30 ENCOUNTER — Other Ambulatory Visit (INDEPENDENT_AMBULATORY_CARE_PROVIDER_SITE_OTHER): Payer: Medicare Other

## 2011-05-30 DIAGNOSIS — I272 Pulmonary hypertension, unspecified: Secondary | ICD-10-CM

## 2011-05-30 DIAGNOSIS — R0600 Dyspnea, unspecified: Secondary | ICD-10-CM

## 2011-05-30 DIAGNOSIS — R0609 Other forms of dyspnea: Secondary | ICD-10-CM

## 2011-05-30 DIAGNOSIS — I2789 Other specified pulmonary heart diseases: Secondary | ICD-10-CM

## 2011-05-30 LAB — CBC WITH DIFFERENTIAL/PLATELET
Basophils Absolute: 0.1 10*3/uL (ref 0.0–0.1)
Eosinophils Relative: 0.7 % (ref 0.0–5.0)
Hemoglobin: 12.2 g/dL (ref 12.0–15.0)
Lymphocytes Relative: 36.8 % (ref 12.0–46.0)
Monocytes Relative: 9.8 % (ref 3.0–12.0)
Neutro Abs: 2.9 10*3/uL (ref 1.4–7.7)
Platelets: 156 10*3/uL (ref 150.0–400.0)
RDW: 21.9 % — ABNORMAL HIGH (ref 11.5–14.6)
WBC: 5.6 10*3/uL (ref 4.5–10.5)

## 2011-05-30 LAB — BASIC METABOLIC PANEL
Calcium: 9.5 mg/dL (ref 8.4–10.5)
GFR: 84.13 mL/min (ref >60.00)
Glucose, Bld: 92 mg/dL (ref 70–99)
Sodium: 142 mEq/L (ref 135–145)

## 2011-05-30 LAB — TSH: TSH: 3.08 u[IU]/mL (ref 0.35–5.50)

## 2011-06-12 ENCOUNTER — Ambulatory Visit (INDEPENDENT_AMBULATORY_CARE_PROVIDER_SITE_OTHER): Payer: Medicare Other | Admitting: *Deleted

## 2011-06-12 DIAGNOSIS — I4891 Unspecified atrial fibrillation: Secondary | ICD-10-CM

## 2011-06-12 DIAGNOSIS — Z7901 Long term (current) use of anticoagulants: Secondary | ICD-10-CM

## 2011-06-12 DIAGNOSIS — Z8679 Personal history of other diseases of the circulatory system: Secondary | ICD-10-CM

## 2011-06-12 LAB — POCT INR: INR: 2.2

## 2011-07-10 ENCOUNTER — Ambulatory Visit (INDEPENDENT_AMBULATORY_CARE_PROVIDER_SITE_OTHER): Payer: Medicare Other | Admitting: *Deleted

## 2011-07-10 DIAGNOSIS — Z7901 Long term (current) use of anticoagulants: Secondary | ICD-10-CM

## 2011-07-10 DIAGNOSIS — Z8679 Personal history of other diseases of the circulatory system: Secondary | ICD-10-CM

## 2011-07-10 DIAGNOSIS — I4891 Unspecified atrial fibrillation: Secondary | ICD-10-CM

## 2011-07-10 LAB — POCT INR: INR: 2.5

## 2011-07-18 ENCOUNTER — Other Ambulatory Visit: Payer: Self-pay | Admitting: Adult Health

## 2011-07-19 NOTE — Telephone Encounter (Signed)
Dr. Katz pt. In [place] 

## 2011-07-19 NOTE — Telephone Encounter (Signed)
Dr. Myrtis Ser pt. In [place]

## 2011-08-07 ENCOUNTER — Ambulatory Visit (INDEPENDENT_AMBULATORY_CARE_PROVIDER_SITE_OTHER): Payer: Medicare Other | Admitting: *Deleted

## 2011-08-07 DIAGNOSIS — I4891 Unspecified atrial fibrillation: Secondary | ICD-10-CM

## 2011-08-07 DIAGNOSIS — Z7901 Long term (current) use of anticoagulants: Secondary | ICD-10-CM

## 2011-08-07 DIAGNOSIS — Z8679 Personal history of other diseases of the circulatory system: Secondary | ICD-10-CM

## 2011-08-21 ENCOUNTER — Ambulatory Visit (INDEPENDENT_AMBULATORY_CARE_PROVIDER_SITE_OTHER): Payer: Medicare Other | Admitting: *Deleted

## 2011-08-21 DIAGNOSIS — I4891 Unspecified atrial fibrillation: Secondary | ICD-10-CM

## 2011-08-21 DIAGNOSIS — Z7901 Long term (current) use of anticoagulants: Secondary | ICD-10-CM | POA: Diagnosis not present

## 2011-08-21 DIAGNOSIS — Z8679 Personal history of other diseases of the circulatory system: Secondary | ICD-10-CM

## 2011-09-18 ENCOUNTER — Encounter: Payer: Medicare Other | Admitting: *Deleted

## 2011-09-18 DIAGNOSIS — H2510 Age-related nuclear cataract, unspecified eye: Secondary | ICD-10-CM | POA: Diagnosis not present

## 2011-09-20 DIAGNOSIS — H2510 Age-related nuclear cataract, unspecified eye: Secondary | ICD-10-CM | POA: Diagnosis not present

## 2011-09-20 DIAGNOSIS — H402290 Chronic angle-closure glaucoma, unspecified eye, stage unspecified: Secondary | ICD-10-CM | POA: Diagnosis not present

## 2011-09-20 DIAGNOSIS — H409 Unspecified glaucoma: Secondary | ICD-10-CM | POA: Diagnosis not present

## 2011-09-25 ENCOUNTER — Ambulatory Visit (INDEPENDENT_AMBULATORY_CARE_PROVIDER_SITE_OTHER): Payer: Medicare Other | Admitting: *Deleted

## 2011-09-25 DIAGNOSIS — Z8679 Personal history of other diseases of the circulatory system: Secondary | ICD-10-CM | POA: Diagnosis not present

## 2011-09-25 DIAGNOSIS — Z7901 Long term (current) use of anticoagulants: Secondary | ICD-10-CM

## 2011-09-25 DIAGNOSIS — I4891 Unspecified atrial fibrillation: Secondary | ICD-10-CM

## 2011-09-30 DIAGNOSIS — H402290 Chronic angle-closure glaucoma, unspecified eye, stage unspecified: Secondary | ICD-10-CM | POA: Diagnosis not present

## 2011-09-30 DIAGNOSIS — H409 Unspecified glaucoma: Secondary | ICD-10-CM | POA: Diagnosis not present

## 2011-10-15 DIAGNOSIS — H402290 Chronic angle-closure glaucoma, unspecified eye, stage unspecified: Secondary | ICD-10-CM | POA: Diagnosis not present

## 2011-10-15 DIAGNOSIS — H409 Unspecified glaucoma: Secondary | ICD-10-CM | POA: Diagnosis not present

## 2011-10-15 DIAGNOSIS — H2510 Age-related nuclear cataract, unspecified eye: Secondary | ICD-10-CM | POA: Diagnosis not present

## 2011-10-21 DIAGNOSIS — H402290 Chronic angle-closure glaucoma, unspecified eye, stage unspecified: Secondary | ICD-10-CM | POA: Diagnosis not present

## 2011-10-21 DIAGNOSIS — IMO0002 Reserved for concepts with insufficient information to code with codable children: Secondary | ICD-10-CM | POA: Diagnosis not present

## 2011-10-21 DIAGNOSIS — H2510 Age-related nuclear cataract, unspecified eye: Secondary | ICD-10-CM | POA: Diagnosis not present

## 2011-10-29 ENCOUNTER — Ambulatory Visit: Payer: Medicare Other | Admitting: Cardiology

## 2011-10-30 ENCOUNTER — Ambulatory Visit (INDEPENDENT_AMBULATORY_CARE_PROVIDER_SITE_OTHER): Payer: Medicare Other | Admitting: *Deleted

## 2011-10-30 DIAGNOSIS — Z8679 Personal history of other diseases of the circulatory system: Secondary | ICD-10-CM

## 2011-10-30 DIAGNOSIS — Z7901 Long term (current) use of anticoagulants: Secondary | ICD-10-CM

## 2011-10-30 DIAGNOSIS — I4891 Unspecified atrial fibrillation: Secondary | ICD-10-CM | POA: Diagnosis not present

## 2011-10-30 LAB — POCT INR: INR: 2.5

## 2011-11-01 ENCOUNTER — Ambulatory Visit: Payer: Medicare Other | Admitting: Cardiology

## 2011-11-07 DIAGNOSIS — Z6841 Body Mass Index (BMI) 40.0 and over, adult: Secondary | ICD-10-CM | POA: Diagnosis not present

## 2011-11-07 DIAGNOSIS — B351 Tinea unguium: Secondary | ICD-10-CM | POA: Diagnosis not present

## 2011-11-07 DIAGNOSIS — J449 Chronic obstructive pulmonary disease, unspecified: Secondary | ICD-10-CM | POA: Diagnosis not present

## 2011-11-07 DIAGNOSIS — G8929 Other chronic pain: Secondary | ICD-10-CM | POA: Diagnosis not present

## 2011-11-09 ENCOUNTER — Other Ambulatory Visit: Payer: Self-pay | Admitting: Cardiology

## 2011-11-11 ENCOUNTER — Other Ambulatory Visit: Payer: Self-pay | Admitting: *Deleted

## 2011-11-11 DIAGNOSIS — G5600 Carpal tunnel syndrome, unspecified upper limb: Secondary | ICD-10-CM | POA: Diagnosis not present

## 2011-11-11 MED ORDER — ATORVASTATIN CALCIUM 80 MG PO TABS: 40.0000 mg | ORAL_TABLET | Freq: Every day | ORAL | Status: DC

## 2011-11-19 ENCOUNTER — Telehealth: Payer: Self-pay | Admitting: Cardiology

## 2011-11-19 ENCOUNTER — Encounter: Payer: Self-pay | Admitting: Cardiology

## 2011-11-19 ENCOUNTER — Ambulatory Visit (INDEPENDENT_AMBULATORY_CARE_PROVIDER_SITE_OTHER): Payer: Medicare Other | Admitting: Cardiology

## 2011-11-19 VITALS — BP 132/78 | HR 67 | Ht 65.0 in | Wt 264.0 lb

## 2011-11-19 DIAGNOSIS — I2510 Atherosclerotic heart disease of native coronary artery without angina pectoris: Secondary | ICD-10-CM | POA: Diagnosis not present

## 2011-11-19 DIAGNOSIS — I2789 Other specified pulmonary heart diseases: Secondary | ICD-10-CM | POA: Diagnosis not present

## 2011-11-19 DIAGNOSIS — I272 Pulmonary hypertension, unspecified: Secondary | ICD-10-CM

## 2011-11-19 DIAGNOSIS — I4891 Unspecified atrial fibrillation: Secondary | ICD-10-CM

## 2011-11-19 LAB — BASIC METABOLIC PANEL
CO2: 32 mEq/L (ref 19–32)
Calcium: 9.6 mg/dL (ref 8.4–10.5)
Chloride: 102 mEq/L (ref 96–112)
Sodium: 142 mEq/L (ref 135–145)

## 2011-11-19 LAB — CBC WITH DIFFERENTIAL/PLATELET
Basophils Absolute: 0 10*3/uL (ref 0.0–0.1)
Eosinophils Relative: 1.4 % (ref 0.0–5.0)
HCT: 35.2 % — ABNORMAL LOW (ref 36.0–46.0)
Hemoglobin: 11.3 g/dL — ABNORMAL LOW (ref 12.0–15.0)
Lymphs Abs: 2 10*3/uL (ref 0.7–4.0)
MCV: 86.3 fl (ref 78.0–100.0)
Monocytes Relative: 12.4 % — ABNORMAL HIGH (ref 3.0–12.0)
Neutro Abs: 3.4 10*3/uL (ref 1.4–7.7)
RDW: 16.8 % — ABNORMAL HIGH (ref 11.5–14.6)

## 2011-11-19 LAB — LIPID PANEL
Cholesterol: 102 mg/dL (ref 0–200)
HDL: 47.3 mg/dL (ref >39.00)
Total CHOL/HDL Ratio: 2
Triglycerides: 116 mg/dL (ref 0.0–149.0)

## 2011-11-19 NOTE — Patient Instructions (Signed)
Your physician wants you to follow-up in: 6 months.  You will receive a reminder letter in the mail two months in advance. If you don't receive a letter, please call our office to schedule the follow-up appointment. Your physician recommends that you have lab work drawn today (BMP, CBC, Lipid panel)

## 2011-11-19 NOTE — Assessment & Plan Note (Signed)
Atrial fibrillation rate is controlled. She is on Coumadin. No change in therapy. 

## 2011-11-19 NOTE — Telephone Encounter (Signed)
New Msg: Pt calling wanting to speak with nurse/MD to find out if MD approves for pt to stop taking coumadin prior to carpal tunnel surgery on R hand, April 12. Please return pt call to discuss further.

## 2011-11-19 NOTE — Assessment & Plan Note (Signed)
Coronary disease is stable. She received a bare-metal stent in December, 2006. Nuclear scan in February 2 012 revealed question of small scar or ischemia in the inferolateral wall versus attenuation. She does not need any further workup at this time.

## 2011-11-19 NOTE — Progress Notes (Signed)
HPI Patient returns for followup of atrial fibrillation and coronary artery disease. She is not having any chest pain. She does not have any palpitations. She continues on Coumadin. Her pulmonary hypertension has been evaluated. She is on home O2 that helps her. Recommendation is for weight loss. She says that she's doing well with the oxygen.   No Known Allergies  Current Outpatient Prescriptions  Medication Sig Dispense Refill  . albuterol (PROVENTIL) 2 MG tablet Take 2 mg by mouth 3 (three) times daily.      [name] ALPRAZolam (XANAX) 0.5 MG tablet Take 0.75 mg by mouth at bedtime as needed.        [name] aspirin 81 MG tablet Take 81 mg by mouth daily.        [name] atorvastatin (LIPITOR) 80 MG tablet Take 0.5 tablets (40 mg total) by mouth daily.  15 tablet  2  . Biotin 1000 MCG tablet Take 1,000 mcg by mouth daily.        . Cholecalciferol (VITAMIN D3) 1000 UNITS CAPS Take 1 tablet by mouth daily.        . Coenzyme Q10 (COQ10) 50 MG CAPS Take 2 tablets by mouth daily.        . famotidine (PEPCID) 20 MG tablet Take 20 mg by mouth as needed.        . fexofenadine (ALLEGRA) 180 MG tablet Take 180 mg by mouth as needed.        . furosemide (LASIX) 40 MG tablet Take two tabs daily      . HYDROcodone-acetaminophen (VICODIN) 5-500 MG per tablet Take 0.5 tablets by mouth every 4 (four) hours as needed.        . metoprolol (TOPROL-XL) 25 MG 24 hr tablet Take 1 tablet (25 mg total) by mouth daily.  30 tablet  6  . Multiple Vitamin (MULTIVITAMIN) capsule Take 1 capsule by mouth daily.        . nitroGLYCERIN (NITROSTAT) 0.4 MG SL tablet Place 0.4 mg under the tongue every 5 (five) minutes as needed.        . NON FORMULARY OXygen at night      . Omega-3 Fatty Acids (FISH OIL) 1000 MG CAPS Take 1 capsule by mouth 2 (two) times daily.        [name] omeprazole (PRILOSEC) 20 MG capsule Take 40 mg by mouth daily.        . potassium chloride SA (K-DUR,KLOR-CON) 20 MEQ tablet Take 20 mEq by mouth daily.        . valsartan  (DIOVAN) 320 MG tablet Take 1 tablet (320 mg total) by mouth daily.  30 tablet  11  . warfarin (COUMADIN) 5 MG tablet TAKE 1 TABLET BY MOUTH BY MOUTH AS DIRECTED.  45 tablet  3    History   Social History  . Marital Status: Married    Spouse Name: N/A    Number of Children: 2  . Years of Education: N/A   Occupational History  . retired    Social History Main Topics  . Smoking status: Former Smoker -- 0.3 packs/day for .5 years    Types: Cigarettes    Quit date: 08/19/1998  . Smokeless tobacco: Never Used  . Alcohol Use: No  . Drug Use: No  . Sexually Active: Not on file   Other Topics Concern  . Not on file   Social History Narrative  . No narrative on file    Family History  Problem Relation Age of  Onset  . Heart attack Mother   . Coronary artery disease Father   . Stroke Father   . Heart attack Brother   . Heart failure Brother   . COPD Brother   . Emphysema Sister     smoked  . Emphysema Brother     smoked    Past Medical History  Diagnosis Date  . Hypertension   . Dyslipidemia   . Atrial fibrillation   . CAD (coronary artery disease)     Bare-metal stent December, 2006 /  Nuclear, October 11, 2010, question of small scar or ischemia in the inferolateral wall versus attenuation  . Degenerative arthritis of right knee   . Morbid obesity   . Psoriasis   . Carotid bruit     doppler, 2012, no significant abnormality  . Ejection fraction     EF 55-60%, echo, August, 2012  . Mitral regurgitation     Mild, echo, August, 2012  . Right ventricular dysfunction     Mild dilatation of the right ventricle, mild decreased right ventricular function, echo, August, 2012  . Tricuspid regurgitation     Severe, echo, August of 2012  . Pulmonary hypertension     Severe, 93 mmHg, echo, August, 2012 and  . Warfarin anticoagulation   . Partial, of this data file.     Past Surgical History  Procedure Date  . Cholecystectomy   . Right ankle surgery     ROS  Patient denies fever, chills, headache, sweats, rash, change in vision, change in hearing, chest pain, cough, nausea vomiting, urinary symptoms. She mentions having cold feet. She does not have any signs of claudication. All other systems are reviewed and are negative.  PHYSICAL EXAM Patient is oriented to person time and place. Affect is normal. She is here with her husband. There is no jugulovenous distention. Lungs are clear. Respiratory effort is nonlabored. She is significantly overweight. Cardiac exam reveals S1 and S2. There no significant murmurs. The abdomen is soft. She has no significant peripheral edema. She is wearing support hose.  Filed Vitals:   11/19/11 1024  BP: 132/78  Pulse: 67  Height: 5\' 5"  (1.651 m)  Weight: 264 lb (119.75 kg)   EKG is done today and reviewed by me. She has atrial fibrillation with a controlled rate. This is unchanged.  ASSESSMENT & PLAN

## 2011-11-19 NOTE — Telephone Encounter (Signed)
Patient called,stated she is scheduled to have carpel tunnel surgery with Dr.Sypher.States wanting to check with Dr.Katz to make sure ok to hold coumadin.Need to call Dr.Sypher's office and let them know.Patient was told Dr.Katz's nurse out of office today,will forward to her and she will call back tomorrow 11/20/11.

## 2011-11-19 NOTE — Assessment & Plan Note (Signed)
Pulmonary hypertension is severe. She's doing better with home O2. This was evaluated by Dr.Wert.

## 2011-11-20 ENCOUNTER — Other Ambulatory Visit: Payer: Self-pay | Admitting: Orthopedic Surgery

## 2011-11-20 NOTE — Telephone Encounter (Signed)
Note written by Dr Myrtis Ser for pt to hold coumadin.  No answer at pt number.

## 2011-11-21 NOTE — Telephone Encounter (Signed)
Note faxed to Dr Stark Jock office.

## 2011-11-22 NOTE — Telephone Encounter (Signed)
Pt was notified.  

## 2011-11-25 NOTE — Telephone Encounter (Signed)
I confirmed with pt that Dr Myrtis Ser had oked her to stop the coumadin.  Length of time prior to surgery would depend on what her surgeon wants.  She states he said 4 days prior.

## 2011-11-25 NOTE — Telephone Encounter (Signed)
New Problem:    Patient called in wanting to know if it was ok for her to take her last dose of coumadin last night in prep for her procedure.  Please call back.

## 2011-11-27 ENCOUNTER — Ambulatory Visit (INDEPENDENT_AMBULATORY_CARE_PROVIDER_SITE_OTHER): Payer: Medicare Other | Admitting: *Deleted

## 2011-11-27 ENCOUNTER — Encounter (HOSPITAL_BASED_OUTPATIENT_CLINIC_OR_DEPARTMENT_OTHER): Payer: Self-pay | Admitting: *Deleted

## 2011-11-27 DIAGNOSIS — Z8679 Personal history of other diseases of the circulatory system: Secondary | ICD-10-CM

## 2011-11-27 DIAGNOSIS — I4891 Unspecified atrial fibrillation: Secondary | ICD-10-CM

## 2011-11-27 DIAGNOSIS — Z7901 Long term (current) use of anticoagulants: Secondary | ICD-10-CM

## 2011-11-27 NOTE — Progress Notes (Signed)
Pt sees Dr Judi Saa 1 stent 2006-AF on coumadin-permission to go off. Sees Dr wert-oxygen only at night-does not use during day-and does not carry tank with her during day. Had inr today with [place] in [place]-1.6 Will need [employer]

## 2011-11-28 NOTE — H&P (Addendum)
Grossly normalGrossly normalGrossly normal2+ and symmetric2+ and symmetric2+ and symmetric2+ and symmetricGrossly normalGrossly normalVirginia L [name] is an 77 y.o. female.   Chief Complaint: c/o chronic and progressive right hand numbness and tingling HPI:  Pt is a  77 y/o female who presented to our office for evaluation of chronic and progressive right hand numbness and tingling with severe weakness of grip. She is s/p left CTR from many years ago. She did well in the post-op period.   Past Medical History  Diagnosis Date  . Hypertension   . Dyslipidemia   . Atrial fibrillation   . CAD (coronary artery disease)     Bare-metal stent December, 2006 /  Nuclear, October 11, 2010, question of small scar or ischemia in the inferolateral wall versus attenuation  . Degenerative arthritis of right knee   . Morbid obesity   . Psoriasis   . Carotid bruit     doppler, 2012, no significant abnormality  . Ejection fraction     EF 55-60%, echo, August, 2012  . Mitral regurgitation     Mild, echo, August, 2012  . Right ventricular dysfunction     Mild dilatation of the right ventricle, mild decreased right ventricular function, echo, August, 2012  . Tricuspid regurgitation     Severe, echo, August of 2012  . Pulmonary hypertension     Severe, 93 mmHg, echo, August, 2012 and  . Warfarin anticoagulation   . Partial, of this data file.   [name] Shortness of breath   . GERD (gastroesophageal reflux disease)   . Neuromuscular disorder     rt carpal tunnel    Past Surgical History  Procedure Date  . Cholecystectomy   . Right ankle surgery   . Cardiac catheterization   . Coronary angioplasty 2006    stent  . Colonoscopy     Family History  Problem Relation Age of Onset  . Heart attack Mother   . Coronary artery disease Father   . Stroke Father   . Heart attack Brother   . Heart failure Brother   . COPD Brother   . Emphysema Sister     smoked  . Emphysema Brother     smoked    Social History:  reports that she quit smoking about 25 years ago. Her smoking use included Cigarettes. She has a .15 pack-year smoking history. She has never used smokeless tobacco. She reports that she does not drink alcohol or use illicit drugs.  Allergies: No Known Allergies  No current facility-administered medications on file as of .   Medications Prior to Admission  Medication Sig Dispense Refill  . albuterol (PROVENTIL) 2 MG tablet Take 2 mg by mouth 3 (three) times daily.      [name] ALPRAZolam (XANAX) 0.5 MG tablet Take 0.75 mg by mouth at bedtime as needed.        [name] aspirin 81 MG tablet Take 81 mg by mouth daily.        [name] atorvastatin (LIPITOR) 80 MG tablet Take 0.5 tablets (40 mg total) by mouth daily.  15 tablet  2  . Biotin 1000 MCG tablet Take 1,000 mcg by mouth daily.        . Cholecalciferol (VITAMIN D3) 1000 UNITS CAPS Take 1 tablet by mouth daily.        . Coenzyme Q10 (COQ10) 50 MG CAPS Take 2 tablets by mouth daily.        . famotidine (PEPCID) 20 MG tablet Take 20 mg by mouth as  needed.        . fexofenadine (ALLEGRA) 180 MG tablet Take 180 mg by mouth as needed.        . furosemide (LASIX) 40 MG tablet Take two tabs daily      . HYDROcodone-acetaminophen (VICODIN) 5-500 MG per tablet Take 0.5 tablets by mouth every 4 (four) hours as needed.        . metoprolol (TOPROL-XL) 25 MG 24 hr tablet Take 1 tablet (25 mg total) by mouth daily.  30 tablet  6  . Multiple Vitamin (MULTIVITAMIN) capsule Take 1 capsule by mouth daily.        . nitroGLYCERIN (NITROSTAT) 0.4 MG SL tablet Place 0.4 mg under the tongue every 5 (five) minutes as needed.        . NON FORMULARY OXygen at night      . Omega-3 Fatty Acids (FISH OIL) 1000 MG CAPS Take 1 capsule by mouth 2 (two) times daily.        [name] omeprazole (PRILOSEC) 20 MG capsule Take 40 mg by mouth daily.        . potassium chloride SA (K-DUR,KLOR-CON) 20 MEQ tablet Take 20 mEq by mouth daily.        . valsartan (DIOVAN) 320 MG tablet  Take 1 tablet (320 mg total) by mouth daily.  30 tablet  11  . warfarin (COUMADIN) 5 MG tablet TAKE 1 TABLET BY MOUTH BY MOUTH AS DIRECTED.  45 tablet  3    Results for orders placed in visit on 11/27/11 (from the past 48 hour(s))  POCT INR     Status: Normal   Collection Time   11/27/11 11:13 AM      Component Value Range Comment   INR 1.6       No results found.   Pertinent items are noted in HPI.  There were no vitals taken for this visit.  General appearance: alert Head: Normocephalic, without obvious abnormality Neck: supple, symmetrical, trachea midline Resp: clear to auscultation bilaterally Cardio: regular rate and rhythm, S1, S2 normal, no murmur, click, rub or gallop GI: normal findings: bowel sounds normal Extremities: . Inspection of her arms and hands reveals thenar atrophy on the right.  She has a positive wrist flexion test.  She has full range of motion of her fingers in flexion/extension, no sign of stenosing tenosynovitis of the fingers, thumb or fist dorsal compartment. She does have mild stigmata of osteoarthritis.    We asked Dr. Johna Roles to complete electrodiagnostic studies of the right hand.  These reveal unrecordable sensory latencies and a markedly prolonged motor latency.  Her amplitude is 4.7 microvolts.    pre op INR 1.6 on 11/27/11  Neuro: WNL    Assessment/Plan ASSESSMENT:   Severe right carpal tunnel syndrome with thenar atrophy.  PLAN:  I have told her that she should proceed with right carpal tunnel release.  She will need to wait five months to see recovery of her thenar atrophy.  The surgery and aftercare were described in detail.  Questions were invited and answered in detail.  She will resume Coumadin anticoagulation today with a double dose of 10 mg followed by 5 mg daily and an INR on Monday 12/02/11    DASNOIT,[name] 11/28/2011, 3:19 PM   Wyn Forster. MD

## 2011-11-29 ENCOUNTER — Encounter (HOSPITAL_BASED_OUTPATIENT_CLINIC_OR_DEPARTMENT_OTHER): Payer: Self-pay | Admitting: *Deleted

## 2011-11-29 ENCOUNTER — Encounter (HOSPITAL_BASED_OUTPATIENT_CLINIC_OR_DEPARTMENT_OTHER): Payer: Self-pay | Admitting: Anesthesiology

## 2011-11-29 ENCOUNTER — Encounter (HOSPITAL_BASED_OUTPATIENT_CLINIC_OR_DEPARTMENT_OTHER)
Admission: RE | Disposition: A | Discharge status: Home / Self Care | Payer: Self-pay | Source: Ambulatory Visit | Attending: Orthopedic Surgery

## 2011-11-29 ENCOUNTER — Ambulatory Visit (HOSPITAL_BASED_OUTPATIENT_CLINIC_OR_DEPARTMENT_OTHER): Payer: Medicare Other | Admitting: Anesthesiology

## 2011-11-29 ENCOUNTER — Ambulatory Visit (HOSPITAL_BASED_OUTPATIENT_CLINIC_OR_DEPARTMENT_OTHER)
Admission: RE | Admit: 2011-11-29 | Discharge: 2011-11-29 | Disposition: A | Discharge status: Home / Self Care | Payer: Medicare Other | Source: Ambulatory Visit | Attending: Orthopedic Surgery | Admitting: Orthopedic Surgery

## 2011-11-29 DIAGNOSIS — I10 Essential (primary) hypertension: Secondary | ICD-10-CM | POA: Insufficient documentation

## 2011-11-29 DIAGNOSIS — Z01812 Encounter for preprocedural laboratory examination: Secondary | ICD-10-CM | POA: Diagnosis not present

## 2011-11-29 DIAGNOSIS — K219 Gastro-esophageal reflux disease without esophagitis: Secondary | ICD-10-CM | POA: Insufficient documentation

## 2011-11-29 DIAGNOSIS — Z7901 Long term (current) use of anticoagulants: Secondary | ICD-10-CM | POA: Insufficient documentation

## 2011-11-29 DIAGNOSIS — G5600 Carpal tunnel syndrome, unspecified upper limb: Secondary | ICD-10-CM | POA: Diagnosis not present

## 2011-11-29 DIAGNOSIS — I4891 Unspecified atrial fibrillation: Secondary | ICD-10-CM | POA: Insufficient documentation

## 2011-11-29 DIAGNOSIS — E785 Hyperlipidemia, unspecified: Secondary | ICD-10-CM | POA: Diagnosis not present

## 2011-11-29 DIAGNOSIS — I2510 Atherosclerotic heart disease of native coronary artery without angina pectoris: Secondary | ICD-10-CM | POA: Insufficient documentation

## 2011-11-29 HISTORY — DX: Gastro-esophageal reflux disease without esophagitis: K21.9

## 2011-11-29 HISTORY — PX: CARPAL TUNNEL RELEASE: SHX101

## 2011-11-29 LAB — POCT I-STAT, CHEM 8
BUN: 30 mg/dL — ABNORMAL HIGH (ref 6–23)
Creatinine, Ser: 1 mg/dL (ref 0.50–1.10)
Glucose, Bld: 93 mg/dL (ref 70–99)
Potassium: 4.1 mEq/L (ref 3.5–5.1)
Sodium: 141 mEq/L (ref 135–145)

## 2011-11-29 SURGERY — CARPAL TUNNEL RELEASE
Anesthesia: General | Site: Wrist | Laterality: Right | Wound class: Clean

## 2011-11-29 MED ORDER — WARFARIN SODIUM 5 MG PO TABS: 10.0000 mg | ORAL_TABLET | Freq: Every day | ORAL | Status: DC

## 2011-11-29 MED ORDER — LIDOCAINE HCL (CARDIAC) 20 MG/ML IV SOLN
INTRAVENOUS | Status: DC | PRN
  Administered 2011-11-29: 30 mg via INTRAVENOUS

## 2011-11-29 MED ORDER — FENTANYL CITRATE 0.05 MG/ML IJ SOLN
INTRAMUSCULAR | Status: DC | PRN
  Administered 2011-11-29: 25 ug via INTRAVENOUS

## 2011-11-29 MED ORDER — HYDROCODONE-ACETAMINOPHEN 5-325 MG PO TABS
ORAL_TABLET | ORAL | Status: AC
Start: 2011-11-29 — End: 2011-12-09

## 2011-11-29 MED ORDER — CEFAZOLIN SODIUM-DEXTROSE 2-3 GM-% IV SOLR
2.0000 g | INTRAVENOUS | Status: AC
  Administered 2011-11-29: 2 g via INTRAVENOUS

## 2011-11-29 MED ORDER — LIDOCAINE HCL 2 % IJ SOLN
INTRAMUSCULAR | Status: DC | PRN
  Administered 2011-11-29: 5 mL

## 2011-11-29 MED ORDER — LACTATED RINGERS IV SOLN
INTRAVENOUS | Status: DC
  Administered 2011-11-29: 09:00:00 via INTRAVENOUS

## 2011-11-29 MED ORDER — PROPOFOL 10 MG/ML IV EMUL
INTRAVENOUS | Status: DC | PRN
  Administered 2011-11-29: 50 ug/kg/min via INTRAVENOUS

## 2011-11-29 MED ORDER — GLYCOPYRROLATE 0.2 MG/ML IJ SOLN
INTRAMUSCULAR | Status: DC | PRN
  Administered 2011-11-29: 0.2 mg via INTRAVENOUS

## 2011-11-29 MED ORDER — CHLORHEXIDINE GLUCONATE 4 % EX LIQD: 60.0000 mL | Freq: Once | CUTANEOUS | Status: DC

## 2011-11-29 MED ORDER — MEPERIDINE HCL 25 MG/ML IJ SOLN: 6.2500 mg | INTRAMUSCULAR | Status: DC | PRN

## 2011-11-29 MED ORDER — FENTANYL CITRATE 0.05 MG/ML IJ SOLN: 25.0000 ug | INTRAMUSCULAR | Status: DC | PRN

## 2011-11-29 MED ORDER — PROMETHAZINE HCL 25 MG/ML IJ SOLN: 6.2500 mg | INTRAMUSCULAR | Status: DC | PRN

## 2011-11-29 MED ORDER — CEFAZOLIN SODIUM 1-5 GM-% IV SOLN: 1.0000 g | INTRAVENOUS | Status: DC

## 2011-11-29 SURGICAL SUPPLY — 37 items
BANDAGE ADHESIVE 1X3 (GAUZE/BANDAGES/DRESSINGS) IMPLANT
BANDAGE ELASTIC 3 VELCRO ST LF (GAUZE/BANDAGES/DRESSINGS) ×2 IMPLANT
BLADE SURG 15 STRL LF DISP TIS (BLADE) ×1 IMPLANT
BLADE SURG 15 STRL SS (BLADE) ×2
BNDG CMPR 9X4 STRL LF SNTH (GAUZE/BANDAGES/DRESSINGS) ×1
BNDG ESMARK 4X9 LF (GAUZE/BANDAGES/DRESSINGS) ×2 IMPLANT
BRUSH SCRUB EZ PLAIN DRY (MISCELLANEOUS) ×2 IMPLANT
CLOTH BEACON ORANGE TIMEOUT ST (SAFETY) ×2 IMPLANT
CORDS BIPOLAR (ELECTRODE) ×2 IMPLANT
COVER MAYO STAND STRL (DRAPES) ×2 IMPLANT
COVER TABLE BACK 60X90 (DRAPES) ×2 IMPLANT
CUFF TOURNIQUET SINGLE 18IN (TOURNIQUET CUFF) IMPLANT
DECANTER SPIKE VIAL GLASS SM (MISCELLANEOUS) IMPLANT
DRAPE EXTREMITY T 121X128X90 (DRAPE) ×2 IMPLANT
DRAPE SURG 17X23 STRL (DRAPES) ×2 IMPLANT
GLOVE BIO SURGEON STRL SZ 6.5 (GLOVE) ×4 IMPLANT
GLOVE BIOGEL M STRL SZ7.5 (GLOVE) ×2 IMPLANT
GLOVE ORTHO TXT STRL SZ7.5 (GLOVE) ×2 IMPLANT
GOWN PREVENTION PLUS XLARGE (GOWN DISPOSABLE) ×2 IMPLANT
GOWN PREVENTION PLUS XXLARGE (GOWN DISPOSABLE) ×4 IMPLANT
NEEDLE 27GAX1X1/2 (NEEDLE) ×2 IMPLANT
PACK BASIN DAY SURGERY FS (CUSTOM PROCEDURE TRAY) ×2 IMPLANT
PAD CAST 3X4 CTTN HI CHSV (CAST SUPPLIES) ×1 IMPLANT
PADDING CAST ABS 4INX4YD NS (CAST SUPPLIES) ×1
PADDING CAST ABS COTTON 4X4 ST (CAST SUPPLIES) ×1 IMPLANT
PADDING CAST COTTON 3X4 STRL (CAST SUPPLIES) ×2
SPLINT PLASTER CAST XFAST 3X15 (CAST SUPPLIES) ×5 IMPLANT
SPLINT PLASTER XTRA FASTSET 3X (CAST SUPPLIES) ×5
SPONGE GAUZE 4X4 12PLY (GAUZE/BANDAGES/DRESSINGS) ×2 IMPLANT
STOCKINETTE 4X48 STRL (DRAPES) ×2 IMPLANT
STRIP CLOSURE SKIN 1/2X4 (GAUZE/BANDAGES/DRESSINGS) ×2 IMPLANT
SUT PROLENE 3 0 PS 2 (SUTURE) ×2 IMPLANT
SYR 3ML 23GX1 SAFETY (SYRINGE) IMPLANT
SYR CONTROL 10ML LL (SYRINGE) ×2 IMPLANT
TRAY DSU PREP LF (CUSTOM PROCEDURE TRAY) ×2 IMPLANT
UNDERPAD 30X30 INCONTINENT (UNDERPADS AND DIAPERS) ×2 IMPLANT
WATER STERILE IRR 1000ML POUR (IV SOLUTION) ×2 IMPLANT

## 2011-11-29 NOTE — Transfer of Care (Signed)
Immediate Anesthesia Transfer of Care Note  Patient: [name]  Procedure(s) Performed: Procedure(s) (LRB): CARPAL TUNNEL RELEASE (Right)  Patient Location: PACU  Anesthesia Type: MAC  Level of Consciousness: awake and alert   Airway & Oxygen Therapy: Patient Spontanous Breathing and Patient connected to face mask oxygen  Post-op Assessment: Report given to PACU RN and Post -op Vital signs reviewed and stable  Post vital signs: Reviewed and stable  Complications: No apparent anesthesia complications

## 2011-11-29 NOTE — Brief Op Note (Signed)
11/29/2011  10:05 AM  PATIENT:  [name]  77 y.o. female  PRE-OPERATIVE DIAGNOSIS:  SEVERE RIGHT CARPAL TUNNEL SYNDROME  POST-OPERATIVE DIAGNOSIS:  SEVERE RIGHT CARPAL TUNNEL SYNDROME  PROCEDURE: CARPAL TUNNEL RELEASE (Right)  SURGEON: Wyn Forster., MD   PHYSICIAN ASSISTANT:   ASSISTANTS: Mallory Shirk.A-C    ANESTHESIA:   IV sedation  EBL:     BLOOD ADMINISTERED:none  DRAINS: none   LOCAL MEDICATIONS USED:  XYLOCAINE   SPECIMEN:  No Specimen  DISPOSITION OF SPECIMEN:  N/A  COUNTS:  YES  TOURNIQUET:   Total Tourniquet Time Documented: Upper Arm (Right) - 13 minutes  DICTATION: .Other Dictation: Dictation Number [id number]  PLAN OF CARE: Discharge to home after PACU  PATIENT DISPOSITION:  PACU - hemodynamically stable.

## 2011-11-29 NOTE — Discharge Instructions (Signed)

## 2011-11-29 NOTE — Anesthesia Postprocedure Evaluation (Signed)
  Anesthesia Post-op Note  Patient: [name]  Procedure(s) Performed: Procedure(s) (LRB): CARPAL TUNNEL RELEASE (Right)  Patient Location: PACU  Anesthesia Type: MAC  Level of Consciousness: awake  Airway and Oxygen Therapy: Patient Spontanous Breathing  Post-op Pain: none  Post-op Assessment: Post-op Vital signs reviewed  Post-op Vital Signs: stable  Complications: No apparent anesthesia complications

## 2011-11-29 NOTE — Anesthesia Preprocedure Evaluation (Signed)
Anesthesia Evaluation  Patient identified by MRN, date of birth, ID band Patient awake    Reviewed: Allergy & Precautions, H&P , NPO status , Patient's Chart, lab work & pertinent test results  History of Anesthesia Complications Negative for: history of anesthetic complications  Airway Mallampati: IV  Neck ROM: Full    Dental  (+) Teeth Intact   Pulmonary shortness of breath,    + decreased breath sounds      Cardiovascular hypertension, + CAD and + DOE + dysrhythmias Atrial Fibrillation + Valvular Problems/Murmurs MR Rhythm:Irregular Rate:Normal + Systolic murmurs Severe pulm HTN, tricuspid regurg   Neuro/Psych  Neuromuscular disease    GI/Hepatic GERD-  ,  Endo/Other  Morbid obesity  Renal/GU      Musculoskeletal  (+) Arthritis -,   Abdominal (+) + obese,   Peds  Hematology   Anesthesia Other Findings   Reproductive/Obstetrics                           Anesthesia Physical Anesthesia Plan  ASA: III  Anesthesia Plan: MAC   Post-op Pain Management:    Induction: Intravenous  Airway Management Planned: Mask and Natural Airway  Additional Equipment:   Intra-op Plan:   Post-operative Plan: Extubation in OR  Informed Consent:   Dental advisory given  Plan Discussed with: CRNA and Surgeon  Anesthesia Plan Comments:         Anesthesia Quick Evaluation

## 2011-11-29 NOTE — Op Note (Signed)
OP NOTE DICTATED 11/29/11 [id number]

## 2011-12-02 ENCOUNTER — Encounter (HOSPITAL_BASED_OUTPATIENT_CLINIC_OR_DEPARTMENT_OTHER): Payer: Self-pay | Admitting: Orthopedic Surgery

## 2011-12-02 ENCOUNTER — Ambulatory Visit (INDEPENDENT_AMBULATORY_CARE_PROVIDER_SITE_OTHER): Payer: Medicare Other | Admitting: *Deleted

## 2011-12-02 DIAGNOSIS — Z8679 Personal history of other diseases of the circulatory system: Secondary | ICD-10-CM

## 2011-12-02 DIAGNOSIS — I4891 Unspecified atrial fibrillation: Secondary | ICD-10-CM | POA: Diagnosis not present

## 2011-12-02 DIAGNOSIS — H903 Sensorineural hearing loss, bilateral: Secondary | ICD-10-CM | POA: Diagnosis not present

## 2011-12-02 DIAGNOSIS — Z7901 Long term (current) use of anticoagulants: Secondary | ICD-10-CM | POA: Diagnosis not present

## 2011-12-02 NOTE — Op Note (Signed)
NAME:  Tayag, Remi                ACCOUNT NO.:  MEDICAL RECORD NO.:  [id number]  LOCATION:                                 FACILITY:  PHYSICIAN:  Katy Fitch. Lanorris Kalisz, M.D.      DATE OF BIRTH:  DATE OF PROCEDURE:  11/29/2011 DATE OF DISCHARGE:                              OPERATIVE REPORT   PREOPERATIVE DIAGNOSIS:  Severe right carpal tunnel syndrome with thenar atrophy.  POSTOPERATIVE DIAGNOSIS:  Severe right carpal tunnel syndrome with thenar atrophy.  OPERATION:  Release of right transcarpal ligament.  OPERATING SURGEON:  Katy Fitch. Beverly Ferner, MD  ASSISTANT:  Marveen Reeks. Dasnoit, PA-C.  ANESTHESIA:  IV sedation with 2% lidocaine, palmar block, and median nerve block, right wrist, total volume of 5 mL of 2% plain lidocaine.  ANESTHETIST:  Katy Fitch. Nikolas Casher, MD.  SUPERVISING ANESTHESIOLOGIST:  Burna Forts, MD.  INDICATIONS:  [name] is a 77 year old woman with a history of severe carpal tunnel syndrome with marked thenar atrophy.  She is referred for evaluation and management by Jerral Bonito, her attending cardiologist.  She had recently had an aortic valve replacement by Evelene Croon.  Clinical examination revealed significant thenar atrophy of the right hand.  Electrodiagnostic studies confirmed very severe carpal tunnel syndrome.  We advised to proceed with release of transcarpal ligament.  She has multiple background medical problems including prior myocardial infarction, prior aortic valve replacement, chronic Coumadin anticoagulation, and chronic atrial arrhythmia.  With these multiple medical problems, we advised her to proceed with simple carpal tunnel release.  We would perform this under local anesthesia and sedation.  Preoperatively, she held her Coumadin for 4 days.  Her last INR was 1.6. She has been instructed preoperatively in the office and in the holding area to take a double dose of Coumadin, i.e., 10 mg a day and resume her usual  Coumadin schedule on Saturday and Sunday, April 13 and December 01, 2011.  She will return to see Dr. Myrtis Ser and/or his coagulation team on Monday, April 15 for an INR.  Questions were invited and answered in detail.  PROCEDURE:  [place] was brought to room #1 of the [facility], placed supine position on the operating table.  Under Dr. Marlane Mingle supervision, IV sedation was provided.  After Betadine prep of the right forearm and palm, 2% lidocaine was infiltrated in the path intended incision.  The right arm and hand were then prepped with Betadine soap solution and sterilely draped.  A pneumatic tourniquet was applied to proximal right brachium.  Following exsanguination of the right arm with Esmarch bandage, arterial tourniquet was inflated to 250 mmHg.  A routine surgical time-out was accomplished.  Procedure commenced with a short incision in line of the ring finger of the palm.  Subcutaneous tissues were carefully divided in the palmar fascia.  This was split longitudinally to the common sense branch of the nerve.  A Penfield 4 elevator was used to sound the carpal canal.  Once the median nerve was cleared from the deep surface of transverse carpal ligament, the ligament was released along its ulnar border with scissors extending into the distal forearm.  Bleeders along the margin  of the released ligament were meticulously electrocauterized with bipolar forceps.  The volar forearm fascia was calcific.  This was released with scissors into the distal forearm.  This widely opened the carpal canal.  No masses or other predicaments were noted.  Due to her background cardiac history as well as residual tricuspid regurgitation, she was provided perioperative prophylactic antibiotics in form of Ancef 2 g IV in the holding area.  The wound was repaired with intradermal 3-0 Prolene suture.  A compressive dressing was applied with a volar plaster splint maintaining the  wrist in 10 degrees of dorsiflexion.  For aftercare, she will return for INR on December 02, 2011.  We will see her in 7 days to change her dressing and possibly remove her sutures. Questions were invited and answered in detail.     Katy Fitch Ann-Marie Kluge, M.D.     RVS/MEDQ  D:  11/29/2011  T:  11/29/2011  Job:  [id number]

## 2011-12-09 ENCOUNTER — Ambulatory Visit (INDEPENDENT_AMBULATORY_CARE_PROVIDER_SITE_OTHER): Payer: Medicare Other | Admitting: *Deleted

## 2011-12-09 DIAGNOSIS — Z7901 Long term (current) use of anticoagulants: Secondary | ICD-10-CM | POA: Diagnosis not present

## 2011-12-09 DIAGNOSIS — Z8679 Personal history of other diseases of the circulatory system: Secondary | ICD-10-CM

## 2011-12-09 DIAGNOSIS — I4891 Unspecified atrial fibrillation: Secondary | ICD-10-CM

## 2011-12-09 LAB — POCT INR: INR: 1.8

## 2011-12-23 ENCOUNTER — Ambulatory Visit (INDEPENDENT_AMBULATORY_CARE_PROVIDER_SITE_OTHER): Payer: Medicare Other | Admitting: *Deleted

## 2011-12-23 DIAGNOSIS — Z7901 Long term (current) use of anticoagulants: Secondary | ICD-10-CM | POA: Diagnosis not present

## 2011-12-23 DIAGNOSIS — I4891 Unspecified atrial fibrillation: Secondary | ICD-10-CM | POA: Diagnosis not present

## 2011-12-23 DIAGNOSIS — Z8679 Personal history of other diseases of the circulatory system: Secondary | ICD-10-CM

## 2011-12-23 LAB — POCT INR: INR: 2.4

## 2011-12-24 DIAGNOSIS — N390 Urinary tract infection, site not specified: Secondary | ICD-10-CM | POA: Diagnosis not present

## 2011-12-24 DIAGNOSIS — Z6841 Body Mass Index (BMI) 40.0 and over, adult: Secondary | ICD-10-CM | POA: Diagnosis not present

## 2011-12-24 DIAGNOSIS — J449 Chronic obstructive pulmonary disease, unspecified: Secondary | ICD-10-CM | POA: Diagnosis not present

## 2011-12-31 DIAGNOSIS — R05 Cough: Secondary | ICD-10-CM | POA: Diagnosis not present

## 2011-12-31 DIAGNOSIS — J209 Acute bronchitis, unspecified: Secondary | ICD-10-CM | POA: Diagnosis not present

## 2011-12-31 DIAGNOSIS — Z6841 Body Mass Index (BMI) 40.0 and over, adult: Secondary | ICD-10-CM | POA: Diagnosis not present

## 2011-12-31 DIAGNOSIS — J449 Chronic obstructive pulmonary disease, unspecified: Secondary | ICD-10-CM | POA: Diagnosis not present

## 2012-01-07 ENCOUNTER — Other Ambulatory Visit: Payer: Self-pay | Admitting: Cardiology

## 2012-01-10 DIAGNOSIS — R05 Cough: Secondary | ICD-10-CM | POA: Diagnosis not present

## 2012-01-10 DIAGNOSIS — R21 Rash and other nonspecific skin eruption: Secondary | ICD-10-CM | POA: Diagnosis not present

## 2012-01-10 DIAGNOSIS — J069 Acute upper respiratory infection, unspecified: Secondary | ICD-10-CM | POA: Diagnosis not present

## 2012-01-17 ENCOUNTER — Other Ambulatory Visit: Payer: Self-pay | Admitting: Cardiology

## 2012-01-20 ENCOUNTER — Ambulatory Visit (INDEPENDENT_AMBULATORY_CARE_PROVIDER_SITE_OTHER): Payer: Medicare Other | Admitting: *Deleted

## 2012-01-20 DIAGNOSIS — I4891 Unspecified atrial fibrillation: Secondary | ICD-10-CM | POA: Diagnosis not present

## 2012-01-20 DIAGNOSIS — Z7901 Long term (current) use of anticoagulants: Secondary | ICD-10-CM

## 2012-01-20 DIAGNOSIS — Z8679 Personal history of other diseases of the circulatory system: Secondary | ICD-10-CM

## 2012-01-23 ENCOUNTER — Ambulatory Visit (INDEPENDENT_AMBULATORY_CARE_PROVIDER_SITE_OTHER): Payer: Medicare Other | Admitting: *Deleted

## 2012-01-23 DIAGNOSIS — Z7901 Long term (current) use of anticoagulants: Secondary | ICD-10-CM

## 2012-01-23 DIAGNOSIS — Z8679 Personal history of other diseases of the circulatory system: Secondary | ICD-10-CM | POA: Diagnosis not present

## 2012-01-23 DIAGNOSIS — I4891 Unspecified atrial fibrillation: Secondary | ICD-10-CM

## 2012-01-23 LAB — POCT INR: INR: 2

## 2012-01-27 ENCOUNTER — Other Ambulatory Visit: Payer: Self-pay | Admitting: Cardiology

## 2012-01-27 MED ORDER — VALSARTAN 320 MG PO TABS: 320.0000 mg | ORAL_TABLET | Freq: Every day | ORAL | Status: DC

## 2012-02-04 DIAGNOSIS — H2510 Age-related nuclear cataract, unspecified eye: Secondary | ICD-10-CM | POA: Diagnosis not present

## 2012-02-17 ENCOUNTER — Ambulatory Visit (INDEPENDENT_AMBULATORY_CARE_PROVIDER_SITE_OTHER): Payer: Medicare Other | Admitting: *Deleted

## 2012-02-17 DIAGNOSIS — Z8679 Personal history of other diseases of the circulatory system: Secondary | ICD-10-CM | POA: Diagnosis not present

## 2012-02-17 DIAGNOSIS — Z7901 Long term (current) use of anticoagulants: Secondary | ICD-10-CM

## 2012-02-17 DIAGNOSIS — I4891 Unspecified atrial fibrillation: Secondary | ICD-10-CM | POA: Diagnosis not present

## 2012-02-17 LAB — POCT INR: INR: 2.4

## 2012-03-03 DIAGNOSIS — H2510 Age-related nuclear cataract, unspecified eye: Secondary | ICD-10-CM | POA: Diagnosis not present

## 2012-03-03 DIAGNOSIS — H402290 Chronic angle-closure glaucoma, unspecified eye, stage unspecified: Secondary | ICD-10-CM | POA: Diagnosis not present

## 2012-03-11 ENCOUNTER — Telehealth: Payer: Self-pay | Admitting: Internal Medicine

## 2012-03-11 NOTE — Telephone Encounter (Signed)
Instructed pt that Oxygen should be set on 2L at HS.

## 2012-03-16 ENCOUNTER — Ambulatory Visit (INDEPENDENT_AMBULATORY_CARE_PROVIDER_SITE_OTHER): Payer: Medicare Other | Admitting: *Deleted

## 2012-03-16 DIAGNOSIS — Z8679 Personal history of other diseases of the circulatory system: Secondary | ICD-10-CM

## 2012-03-16 DIAGNOSIS — I4891 Unspecified atrial fibrillation: Secondary | ICD-10-CM | POA: Diagnosis not present

## 2012-03-16 DIAGNOSIS — Z7901 Long term (current) use of anticoagulants: Secondary | ICD-10-CM

## 2012-03-24 DIAGNOSIS — R05 Cough: Secondary | ICD-10-CM | POA: Diagnosis not present

## 2012-03-24 DIAGNOSIS — Z6841 Body Mass Index (BMI) 40.0 and over, adult: Secondary | ICD-10-CM | POA: Diagnosis not present

## 2012-03-24 DIAGNOSIS — J449 Chronic obstructive pulmonary disease, unspecified: Secondary | ICD-10-CM | POA: Diagnosis not present

## 2012-04-06 ENCOUNTER — Other Ambulatory Visit: Payer: Self-pay | Admitting: Cardiology

## 2012-04-06 ENCOUNTER — Other Ambulatory Visit: Payer: Self-pay

## 2012-04-08 ENCOUNTER — Other Ambulatory Visit: Payer: Self-pay | Admitting: *Deleted

## 2012-04-08 MED ORDER — ATORVASTATIN CALCIUM 80 MG PO TABS: 40.0000 mg | ORAL_TABLET | Freq: Every day | ORAL | Status: DC

## 2012-04-27 ENCOUNTER — Ambulatory Visit (INDEPENDENT_AMBULATORY_CARE_PROVIDER_SITE_OTHER): Payer: Medicare Other | Admitting: *Deleted

## 2012-04-27 DIAGNOSIS — Z8679 Personal history of other diseases of the circulatory system: Secondary | ICD-10-CM

## 2012-04-27 DIAGNOSIS — I4891 Unspecified atrial fibrillation: Secondary | ICD-10-CM | POA: Diagnosis not present

## 2012-04-27 DIAGNOSIS — Z7901 Long term (current) use of anticoagulants: Secondary | ICD-10-CM | POA: Diagnosis not present

## 2012-05-04 ENCOUNTER — Telehealth: Payer: Self-pay | Admitting: Internal Medicine

## 2012-05-04 NOTE — Telephone Encounter (Signed)
I spoke with pt and she stated she would like to switch from Dr. Sherene Sires to Dr. Shelle Iron. She stated she does not know much about her condition and felt like Dr. Shelle Iron would better help her-also would like a second opinion. Pt aware of our protocol. Please advise Dr. Sherene Sires. thanks

## 2012-05-04 NOTE — Telephone Encounter (Signed)
Fine with me - she should have returned by now   and probably does need sleep eval anyway to complete the w/u.

## 2012-05-05 NOTE — Telephone Encounter (Signed)
Dr. Clance please advise thanks 

## 2012-05-06 NOTE — Telephone Encounter (Signed)
PT given appt with KC on 05-26-12 at 11:45.

## 2012-05-06 NOTE — Telephone Encounter (Signed)
Ok with me, but will need consult slot.

## 2012-05-18 ENCOUNTER — Ambulatory Visit (INDEPENDENT_AMBULATORY_CARE_PROVIDER_SITE_OTHER): Payer: Medicare Other | Admitting: Cardiology

## 2012-05-18 ENCOUNTER — Encounter: Payer: Self-pay | Admitting: Cardiology

## 2012-05-18 VITALS — BP 138/68 | HR 93 | Ht 65.0 in | Wt 256.0 lb

## 2012-05-18 DIAGNOSIS — I2721 Secondary pulmonary arterial hypertension: Secondary | ICD-10-CM

## 2012-05-18 DIAGNOSIS — E785 Hyperlipidemia, unspecified: Secondary | ICD-10-CM | POA: Diagnosis not present

## 2012-05-18 DIAGNOSIS — I2789 Other specified pulmonary heart diseases: Secondary | ICD-10-CM | POA: Diagnosis not present

## 2012-05-18 DIAGNOSIS — I4891 Unspecified atrial fibrillation: Secondary | ICD-10-CM | POA: Diagnosis not present

## 2012-05-18 DIAGNOSIS — I10 Essential (primary) hypertension: Secondary | ICD-10-CM

## 2012-05-18 DIAGNOSIS — I2510 Atherosclerotic heart disease of native coronary artery without angina pectoris: Secondary | ICD-10-CM

## 2012-05-18 DIAGNOSIS — Z7901 Long term (current) use of anticoagulants: Secondary | ICD-10-CM | POA: Diagnosis not present

## 2012-05-18 LAB — CBC WITH DIFFERENTIAL/PLATELET
Basophils Relative: 0.3 % (ref 0.0–3.0)
Eosinophils Relative: 1.2 % (ref 0.0–5.0)
HCT: 38.8 % (ref 36.0–46.0)
Hemoglobin: 12.4 g/dL (ref 12.0–15.0)
MCV: 89.5 fl (ref 78.0–100.0)
Monocytes Absolute: 0.7 10*3/uL (ref 0.1–1.0)
Neutro Abs: 3.6 10*3/uL (ref 1.4–7.7)
Neutrophils Relative %: 54.9 % (ref 43.0–77.0)
RBC: 4.33 Mil/uL (ref 3.87–5.11)
WBC: 6.6 10*3/uL (ref 4.5–10.5)

## 2012-05-18 LAB — BASIC METABOLIC PANEL
BUN: 25 mg/dL — ABNORMAL HIGH (ref 6–23)
Calcium: 9.4 mg/dL (ref 8.4–10.5)
Creatinine, Ser: 1 mg/dL (ref 0.4–1.2)
GFR: 56.78 mL/min — ABNORMAL LOW (ref >60.00)
Glucose, Bld: 87 mg/dL (ref 70–99)

## 2012-05-18 NOTE — Assessment & Plan Note (Signed)
Atrial fibrillation persists. She's not having any symptoms. Her atrial fib rate is slowed today. The only medication affecting rate is beta blocker. Her metoprolol will be cut to 12.5 mg for a week and then stopped completely.

## 2012-05-18 NOTE — Assessment & Plan Note (Signed)
Pulmonary hypertension is followed by the pulmonary team. She is on oxygen at home at night. No further workup.

## 2012-05-18 NOTE — Progress Notes (Signed)
HPI  Patient is seen today for followup of atrial fibrillation and coronary disease. She's not having any chest pain or palpitations. She is on Coumadin. She has pulmonary hypertension and she is on home O2 at night. She's not having any dizziness, syncope, or presyncope.  No Known Allergies  Current Outpatient Prescriptions  Medication Sig Dispense Refill  . albuterol (PROVENTIL) 2 MG tablet Take 2 mg by mouth 3 (three) times daily as needed.       . ALPRAZolam (XANAX) 0.5 MG tablet Take 0.75 mg by mouth at bedtime as needed.        [name] aspirin 81 MG tablet Take 81 mg by mouth daily.        [name] atorvastatin (LIPITOR) 80 MG tablet Take 0.5 tablets (40 mg total) by mouth daily.  45 tablet  2  . AZOPT 1 % ophthalmic suspension as directed.       . Biotin 1000 MCG tablet Take 1,000 mcg by mouth daily.        . Cholecalciferol (VITAMIN D3) 1000 UNITS CAPS Take 1 tablet by mouth daily.        . Coenzyme Q10 (COQ10) 50 MG CAPS Take 2 tablets by mouth daily.        . famotidine (PEPCID) 20 MG tablet Take 20 mg by mouth as needed.        . fexofenadine (ALLEGRA) 180 MG tablet Take 180 mg by mouth as needed.        . furosemide (LASIX) 40 MG tablet Take two tabs daily      . HYDROcodone-acetaminophen (VICODIN) 5-500 MG per tablet Take 0.5 tablets by mouth every 4 (four) hours as needed.        . latanoprost (XALATAN) 0.005 % ophthalmic solution as directed.       . metoprolol succinate (TOPROL-XL) 25 MG 24 hr tablet TAKE 1 TABLET BY MOUTH EVERY DAY - EMERGENCY REFILL FAXED DR  30 tablet  6  . Multiple Vitamin (MULTIVITAMIN) capsule Take 1 capsule by mouth daily.        . nitroGLYCERIN (NITROSTAT) 0.4 MG SL tablet Place 0.4 mg under the tongue every 5 (five) minutes as needed.        . NON FORMULARY OXygen at night      . Omega-3 Fatty Acids (FISH OIL) 1000 MG CAPS Take 1 capsule by mouth 2 (two) times daily.        [name] omeprazole (PRILOSEC) 20 MG capsule Take 40 mg by mouth daily.        . potassium  chloride SA (K-DUR,KLOR-CON) 20 MEQ tablet Take 20 mEq by mouth daily.        . valsartan (DIOVAN) 320 MG tablet Take 1 tablet (320 mg total) by mouth daily.  30 tablet  6  . warfarin (COUMADIN) 5 MG tablet TAKE 1 TABLET BY MOUTH BY MOUTH AS DIRECTED.  45 tablet  2  . DISCONTD: warfarin (COUMADIN) 5 MG tablet TAKE 1 TABLET BY MOUTH BY MOUTH AS DIRECTED.  45 tablet  3  . DISCONTD: warfarin (COUMADIN) 5 MG tablet Take 2 tablets (10 mg total) by mouth daily.  2 tablet  0    History   Social History  . Marital Status: Married    Spouse Name: N/A    Number of Children: 2  . Years of Education: N/A   Occupational History  . retired    Social History Main Topics  . Smoking status: Former Smoker -- 0.3 packs/day  for .5 years    Types: Cigarettes    Quit date: 08/19/1986  . Smokeless tobacco: Never Used  . Alcohol Use: No  . Drug Use: No  . Sexually Active: Not on file   Other Topics Concern  . Not on file   Social History Narrative  . No narrative on file    Family History  Problem Relation Age of Onset  . Heart attack Mother   . Coronary artery disease Father   . Stroke Father   . Heart attack Brother   . Heart failure Brother   . COPD Brother   . Emphysema Sister     smoked  . Emphysema Brother     smoked    Past Medical History  Diagnosis Date  . Hypertension   . Dyslipidemia   . Atrial fibrillation   . CAD (coronary artery disease)     Bare-metal stent December, 2006 /  Nuclear, October 11, 2010, question of small scar or ischemia in the inferolateral wall versus attenuation  . Degenerative arthritis of right knee   . Morbid obesity   . Psoriasis   . Carotid bruit     doppler, 2012, no significant abnormality  . Ejection fraction     EF 55-60%, echo, August, 2012  . Mitral regurgitation     Mild, echo, August, 2012  . Right ventricular dysfunction     Mild dilatation of the right ventricle, mild decreased right ventricular function, echo, August, 2012  .  Tricuspid regurgitation     Severe, echo, August of 2012  . Pulmonary hypertension     Severe, 93 mmHg, echo, August, 2012 and  . Warfarin anticoagulation   . Partial, of this data file.   [name] Shortness of breath   . GERD (gastroesophageal reflux disease)   . Neuromuscular disorder     rt carpal tunnel    Past Surgical History  Procedure Date  . Cholecystectomy   . Right ankle surgery   . Cardiac catheterization   . Coronary angioplasty 2006    stent  . Colonoscopy   . Carpal tunnel release 11/29/2011    Procedure: CARPAL TUNNEL RELEASE;  Surgeon: Wyn Forster., MD;  Location: [facility];  Service: Orthopedics;  Laterality: Right;    ROS   Patient denies fever, chills, headache, sweats, rash, change in vision, change in hearing, chest pain, cough, nausea vomiting, urinary symptoms. All other systems are reviewed and are negative.  PHYSICAL EXAM  Patient is stable. She is overweight. She is here with her husband. She is oriented to person time and place. Affect is normal. There is no jugulovenous distention. Lungs are clear. Respiratory effort is nonlabored. Cardiac exam reveals S1 and S2. There no clicks or significant murmurs. The abdomen is soft. She is wearing support hose.There no skin rashes.  Filed Vitals:   05/18/12 1045  BP: 138/68  Pulse: 93  Height: 5\' 5"  (1.651 m)  Weight: 256 lb (116.121 kg)  SpO2: 95%   EKG is done today and reviewed by me. She has atrial fibrillation. The rate is on the slow side today.  ASSESSMENT & PLAN

## 2012-05-18 NOTE — Assessment & Plan Note (Signed)
She continues on Coumadin for atrial fibrillation.

## 2012-05-18 NOTE — Assessment & Plan Note (Signed)
Coronary disease is stable. No change in therapy. 

## 2012-05-18 NOTE — Patient Instructions (Addendum)
Your physician has recommended you make the following change in your medication: decrease Metoprolol to 1/2 tab for 1 week then stop taking  Your physician recommends that you return for lab work in: today  Your physician wants you to follow-up in: 6 months You will receive a reminder letter in the mail two months in advance. If you don't receive a letter, please call our office to schedule the follow-up appointment.

## 2012-05-18 NOTE — Assessment & Plan Note (Signed)
Lipids are being treated. We will obtain fasting labs today. She is concerned about recent data that she is heard about fish oil and this has been stopped.

## 2012-05-18 NOTE — Assessment & Plan Note (Signed)
Blood pressures control. No change in therapy. 

## 2012-05-26 ENCOUNTER — Ambulatory Visit (INDEPENDENT_AMBULATORY_CARE_PROVIDER_SITE_OTHER): Payer: Medicare Other | Admitting: Pulmonary Disease

## 2012-05-26 ENCOUNTER — Encounter: Payer: Self-pay | Admitting: Pulmonary Disease

## 2012-05-26 VITALS — BP 118/72 | HR 47 | Ht 65.0 in | Wt 261.2 lb

## 2012-05-26 DIAGNOSIS — I2789 Other specified pulmonary heart diseases: Secondary | ICD-10-CM | POA: Diagnosis not present

## 2012-05-26 DIAGNOSIS — R0609 Other forms of dyspnea: Secondary | ICD-10-CM | POA: Diagnosis not present

## 2012-05-26 DIAGNOSIS — R0989 Other specified symptoms and signs involving the circulatory and respiratory systems: Secondary | ICD-10-CM

## 2012-05-26 DIAGNOSIS — I2721 Secondary pulmonary arterial hypertension: Secondary | ICD-10-CM

## 2012-05-26 NOTE — Patient Instructions (Addendum)
Will schedule for followup echo to check your pulmonary artery pressures.  If these are still abnormal, you will need what is called a right heart cath to verify the pressures and see where it may be coming from.  Will schedule for sleep study, and will call you with results.  Work on weight loss. Will arrange followup once we get some of the testing results back.

## 2012-05-26 NOTE — Progress Notes (Signed)
  Subjective:    Patient ID: [name], female    DOB: 12-12-1934, 77 y.o.   MRN: [id number]  HPI The patient is a 77 year old female who comes in today for evaluation of dyspnea.  She is morbidly obese, and has had dyspnea on exertion for over a year, but feels it is not progressive.  She has had a cardiac and pulmonary evaluation last year, and found to have restriction on PFTs, as well as severe pulmonary hypertension by echocardiogram.  She is felt to have obesity hypoventilation syndrome, and possibly obstructive sleep apnea.  She also had a ventilation perfusion scan that was somewhat suspicious for chronic thromboembolic disease.  She has been on long-standing Coumadin for her atrial fibrillation.  The patient describes a one block dyspnea on exertion, and is totally unable to walk upstairs, partly because of her breathing and partly because of musculoskeletal issues.  She has had chronic lower extremity edema for years, but tells me that her weight has not changed in the last 1-2 years.  She has no history of thromboembolic disease, but does have a history of superficial phlebitis.  She did have a chest x-ray last year that was unimpressive, and also a stress test that did not show significant ischemia.  It should be noted she does have a mildly elevated serum bicarbonate level, suggestive of hypercarbia.   Review of Systems  Constitutional: Negative for fever and unexpected weight change.  HENT: Positive for rhinorrhea and trouble swallowing ( has gerd ). Negative for ear pain, nosebleeds, congestion, sore throat, sneezing, dental problem, postnasal drip and sinus pressure.   Eyes: Negative for redness and itching.  Respiratory: Positive for shortness of breath ( activity ). Negative for cough, chest tightness and wheezing.   Cardiovascular: Positive for leg swelling. Negative for palpitations.  Gastrointestinal: Negative for nausea and vomiting.  Genitourinary: Negative for dysuria.    Musculoskeletal: Positive for joint swelling.  Skin: Negative for rash.  Neurological: Negative for headaches.  Hematological: Bruises/bleeds easily.  Psychiatric/Behavioral: Negative for dysphoric mood. The patient is not nervous/anxious.        Objective:   Physical Exam Constitutional:  Morbidly obese female, no acute distress  HENT:  Nares patent without discharge  Oropharynx without exudate, palate and uvula are thick and elongated.   Eyes:  Perrla, eomi, no scleral icterus  Neck:  No JVD, no TMG  Cardiovascular:  Slightly irregular rhythm, no rubs or gallops.  2/6 sem        Intact distal pulses but decreased   Pulmonary :  Normal breath sounds, no stridor or respiratory distress   No rales, rhonchi, or wheezing  Abdominal:  Soft, nondistended, bowel sounds present.  No tenderness noted.   Musculoskeletal:  3+ lower extremity edema noted.  Lymph Nodes:  No cervical lymphadenopathy noted  Skin:  No cyanosis noted  Neurologic:  Alert, appropriate, moves all 4 extremities without obvious deficit.         Assessment & Plan:

## 2012-05-26 NOTE — Assessment & Plan Note (Signed)
The patient has a history of severe pulmonary hypertension by previous echocardiogram, but she has not had a right heart catheterization for verification.  I would like to repeat her echo, and if her pressures are still elevated, would highly recommend a right heart cath.  Possible etiologies for this would include diastolic dysfunction, chronic thromboembolic disease (her V/Q is somewhat suggestive?), obesity hypoventilation syndrome/obstructive sleep apnea, and possibly primary pulmonary hypertension (doubt).  If her pressures are verified to be elevated, and the right heart catheterization shows a significantly elevated pulmonary vascular resistance, she will also need a connective tissue workup.  She has been on chronic anti-coagulation for years, and therefore, there is not a lot to do if this is chronic thromboembolic disease unless there is extensive clot burden.  Will start with a repeat echo, followed by RHC if the pressures are indeed estimated to be elevated.

## 2012-05-26 NOTE — Assessment & Plan Note (Signed)
More than likely this is multifactorial, and related to her underlying cardiac disease, pulmonary hypertension, and finally the complications of morbid obesity.  She does not have any obvious intrinsic lung disease.  I have stressed to her the importance of weight loss and conditioning.  Will schedule her for a sleep study, and she may also require an arterial blood gas to evaluate for obesity hypoventilation syndrome.

## 2012-05-27 ENCOUNTER — Ambulatory Visit (INDEPENDENT_AMBULATORY_CARE_PROVIDER_SITE_OTHER): Payer: Medicare Other | Admitting: *Deleted

## 2012-05-27 DIAGNOSIS — Z8679 Personal history of other diseases of the circulatory system: Secondary | ICD-10-CM | POA: Diagnosis not present

## 2012-05-27 DIAGNOSIS — I4891 Unspecified atrial fibrillation: Secondary | ICD-10-CM | POA: Diagnosis not present

## 2012-05-27 DIAGNOSIS — Z7901 Long term (current) use of anticoagulants: Secondary | ICD-10-CM

## 2012-06-03 DIAGNOSIS — H402290 Chronic angle-closure glaucoma, unspecified eye, stage unspecified: Secondary | ICD-10-CM | POA: Diagnosis not present

## 2012-06-03 DIAGNOSIS — H2510 Age-related nuclear cataract, unspecified eye: Secondary | ICD-10-CM | POA: Diagnosis not present

## 2012-06-08 ENCOUNTER — Ambulatory Visit (HOSPITAL_COMMUNITY): Payer: Medicare Other | Attending: Internal Medicine

## 2012-06-08 DIAGNOSIS — R0609 Other forms of dyspnea: Secondary | ICD-10-CM | POA: Diagnosis not present

## 2012-06-08 DIAGNOSIS — I079 Rheumatic tricuspid valve disease, unspecified: Secondary | ICD-10-CM | POA: Insufficient documentation

## 2012-06-08 DIAGNOSIS — I059 Rheumatic mitral valve disease, unspecified: Secondary | ICD-10-CM | POA: Diagnosis not present

## 2012-06-08 DIAGNOSIS — I2789 Other specified pulmonary heart diseases: Secondary | ICD-10-CM

## 2012-06-08 DIAGNOSIS — R0989 Other specified symptoms and signs involving the circulatory and respiratory systems: Secondary | ICD-10-CM | POA: Insufficient documentation

## 2012-06-08 DIAGNOSIS — I379 Nonrheumatic pulmonary valve disorder, unspecified: Secondary | ICD-10-CM | POA: Diagnosis not present

## 2012-06-08 DIAGNOSIS — I4891 Unspecified atrial fibrillation: Secondary | ICD-10-CM | POA: Diagnosis not present

## 2012-06-08 DIAGNOSIS — I2721 Secondary pulmonary arterial hypertension: Secondary | ICD-10-CM

## 2012-06-08 NOTE — Progress Notes (Signed)
Echocardiogram performed.  

## 2012-06-09 ENCOUNTER — Telehealth: Payer: Self-pay | Admitting: Cardiology

## 2012-06-09 NOTE — Telephone Encounter (Signed)
Pt had echo yesterday and was called today to say she had "pressure" around her heart, she would like to know how dangerous this is and what it means, pls call

## 2012-06-10 ENCOUNTER — Telehealth: Payer: Self-pay | Admitting: Pulmonary Disease

## 2012-06-10 DIAGNOSIS — Z23 Encounter for immunization: Secondary | ICD-10-CM | POA: Diagnosis not present

## 2012-06-10 NOTE — Telephone Encounter (Signed)
Pt to contact her pulmonologist per Myrtis Ser regarding this matter.  She was notified of this and agrees.

## 2012-06-10 NOTE — Telephone Encounter (Signed)
I spoke with pt and she stated she was very concerned regarding her ECHO results. She called dr. Myrtis Ser office and advised if she should be worried about this. Per pt she was told that this is something she should be concerned about and to contact [facility] regarding this. Pt is very worried about the results of her ECHO and wants to know exactly how dangerous this is and if she should have her Sleep Study scheduled earlier than 06/23/12 (date she is currently scheduled for). Please advise KC thanks

## 2012-06-10 NOTE — Telephone Encounter (Signed)
Spoke with pt and notified of recs per KC She verbalized understanding and states nothing further needed 

## 2012-06-10 NOTE — Telephone Encounter (Signed)
She has pulmonary htn, and in order to treat this, we have to know what is causing it.  Can't treat if we dont know what the issue is.  That is what we are doing now, and sleep apnea is a common cause of this.

## 2012-06-11 ENCOUNTER — Other Ambulatory Visit: Payer: Self-pay | Admitting: *Deleted

## 2012-06-11 MED ORDER — NITROGLYCERIN 0.4 MG SL SUBL: 0.4000 mg | SUBLINGUAL_TABLET | SUBLINGUAL | Status: DC | PRN

## 2012-06-15 DIAGNOSIS — M999 Biomechanical lesion, unspecified: Secondary | ICD-10-CM | POA: Diagnosis not present

## 2012-06-15 DIAGNOSIS — M47817 Spondylosis without myelopathy or radiculopathy, lumbosacral region: Secondary | ICD-10-CM | POA: Diagnosis not present

## 2012-06-16 DIAGNOSIS — M47817 Spondylosis without myelopathy or radiculopathy, lumbosacral region: Secondary | ICD-10-CM | POA: Diagnosis not present

## 2012-06-16 DIAGNOSIS — M999 Biomechanical lesion, unspecified: Secondary | ICD-10-CM | POA: Diagnosis not present

## 2012-06-18 DIAGNOSIS — M47817 Spondylosis without myelopathy or radiculopathy, lumbosacral region: Secondary | ICD-10-CM | POA: Diagnosis not present

## 2012-06-18 DIAGNOSIS — M999 Biomechanical lesion, unspecified: Secondary | ICD-10-CM | POA: Diagnosis not present

## 2012-06-18 DIAGNOSIS — M5430 Sciatica, unspecified side: Secondary | ICD-10-CM | POA: Diagnosis not present

## 2012-06-18 DIAGNOSIS — S335XXA Sprain of ligaments of lumbar spine, initial encounter: Secondary | ICD-10-CM | POA: Diagnosis not present

## 2012-06-23 ENCOUNTER — Ambulatory Visit (HOSPITAL_BASED_OUTPATIENT_CLINIC_OR_DEPARTMENT_OTHER): Payer: Medicare Other | Attending: Pulmonary Disease | Admitting: Radiology

## 2012-06-23 VITALS — Ht 65.0 in | Wt 261.0 lb

## 2012-06-23 DIAGNOSIS — I2789 Other specified pulmonary heart diseases: Secondary | ICD-10-CM | POA: Insufficient documentation

## 2012-06-23 DIAGNOSIS — R0989 Other specified symptoms and signs involving the circulatory and respiratory systems: Secondary | ICD-10-CM | POA: Insufficient documentation

## 2012-06-23 DIAGNOSIS — M5430 Sciatica, unspecified side: Secondary | ICD-10-CM | POA: Diagnosis not present

## 2012-06-23 DIAGNOSIS — R0609 Other forms of dyspnea: Secondary | ICD-10-CM | POA: Insufficient documentation

## 2012-06-23 DIAGNOSIS — S335XXA Sprain of ligaments of lumbar spine, initial encounter: Secondary | ICD-10-CM | POA: Diagnosis not present

## 2012-06-23 DIAGNOSIS — G4733 Obstructive sleep apnea (adult) (pediatric): Secondary | ICD-10-CM

## 2012-06-23 DIAGNOSIS — I2721 Secondary pulmonary arterial hypertension: Secondary | ICD-10-CM

## 2012-06-23 DIAGNOSIS — M47817 Spondylosis without myelopathy or radiculopathy, lumbosacral region: Secondary | ICD-10-CM | POA: Diagnosis not present

## 2012-06-23 DIAGNOSIS — M999 Biomechanical lesion, unspecified: Secondary | ICD-10-CM | POA: Diagnosis not present

## 2012-07-08 ENCOUNTER — Telehealth: Payer: Self-pay | Admitting: Pulmonary Disease

## 2012-07-08 DIAGNOSIS — G4710 Hypersomnia, unspecified: Secondary | ICD-10-CM | POA: Diagnosis not present

## 2012-07-08 DIAGNOSIS — G4730 Sleep apnea, unspecified: Secondary | ICD-10-CM | POA: Diagnosis not present

## 2012-07-08 NOTE — Procedures (Signed)
[name], [name]           ACCOUNT NO.:  [id number]  MEDICAL RECORD NO.:  [id number]          PATIENT TYPE:  OUT  LOCATION:  SLEEP CENTER                 FACILITY:  [facility]  PHYSICIAN:  Barbaraann Share, MD,FCCPDATE OF BIRTH:  07-06-[birth year]  DATE OF STUDY:  06/23/2012                           NOCTURNAL POLYSOMNOGRAM  REFERRING PHYSICIAN:  Barbaraann Share, MD,FCCP  LOCATION:  Sleep Lab.  REFERRING PHYSICIAN:  Barbaraann Share, MD,FCCP  INDICATION FOR STUDY:  Significant pulmonary hypertension with a history suggestive of obstructive sleep apnea.  EPWORTH SLEEPINESS SCORE:  7.  SLEEP ARCHITECTURE:  The patient had a total sleep time of 308 minutes with no slow-wave sleep and only 49 minutes of REM.  Sleep onset latency was normal at 28 minutes and REM onset was prolonged at 143 minutes. Sleep efficiency was mildly reduced at 87%.  RESPIRATORY DATA:  The patient was found to have 3 apneas and 18 obstructive hypopneas, giving her an apnea/hypopnea index of only 4 events per hour.  The events occurred in all body positions, but were more prominent during REM.  There was moderate to loud snoring noted throughout.  OXYGEN DATA:  There was O2 desaturation as low as 80% with the patient's obstructive events.  It should be noted the patient was started on 1 L of oxygen by nasal cannula at midnight and that she normally wears 2 L at home.  CARDIAC DATA:  The patient was noted to be in atrial fibrillation with a controlled ventricular response.  MOVEMENT/PARASOMNIA:  The patient had no significant leg jerks or other abnormal behaviors noted.  IMPRESSION/RECOMMENDATION: 1. Small numbers of obstructive events, which do not meet the AHI     criteria for the obstructive sleep apnea syndrome.  There was O2     desaturation as low as 80% that required supplemental oxygen in     order to maintain     sats.  The patient is already on 2 L/minute at home during sleep. 2. Atrial  fibrillation with a controlled ventricular response.     Barbaraann Share, MD,FCCP Diplomate, American Board of Sleep Medicine    [facility]/MEDQ  D:  07/08/2012 08:44:49  T:  07/08/2012 20:52:53  Job:  [id number]

## 2012-07-08 NOTE — Telephone Encounter (Signed)
Please let pt know that she does not have sleep apnea. Therefore, she needs to have the test done that we discussed where they use a catheter to actually measure the pressure.  I will discuss with the cardiologist and get back with her.

## 2012-07-09 NOTE — Telephone Encounter (Signed)
Called, spoke with pt.  Informed her of below per Dr. Clance.  She verbalized understanding.   

## 2012-07-10 DIAGNOSIS — R05 Cough: Secondary | ICD-10-CM | POA: Diagnosis not present

## 2012-07-10 DIAGNOSIS — Z6841 Body Mass Index (BMI) 40.0 and over, adult: Secondary | ICD-10-CM | POA: Diagnosis not present

## 2012-07-10 DIAGNOSIS — J019 Acute sinusitis, unspecified: Secondary | ICD-10-CM | POA: Diagnosis not present

## 2012-07-10 DIAGNOSIS — I4891 Unspecified atrial fibrillation: Secondary | ICD-10-CM | POA: Diagnosis not present

## 2012-07-13 ENCOUNTER — Other Ambulatory Visit: Payer: Self-pay | Admitting: *Deleted

## 2012-07-13 MED ORDER — WARFARIN SODIUM 5 MG PO TABS: 5.0000 mg | ORAL_TABLET | ORAL | Status: DC

## 2012-07-15 ENCOUNTER — Ambulatory Visit (INDEPENDENT_AMBULATORY_CARE_PROVIDER_SITE_OTHER): Payer: Medicare Other | Admitting: *Deleted

## 2012-07-15 DIAGNOSIS — Z7901 Long term (current) use of anticoagulants: Secondary | ICD-10-CM

## 2012-07-15 DIAGNOSIS — I4891 Unspecified atrial fibrillation: Secondary | ICD-10-CM

## 2012-07-15 DIAGNOSIS — Z8679 Personal history of other diseases of the circulatory system: Secondary | ICD-10-CM

## 2012-07-15 LAB — POCT INR: INR: 2.2

## 2012-07-20 ENCOUNTER — Telehealth: Payer: Self-pay | Admitting: Pulmonary Disease

## 2012-07-20 NOTE — Telephone Encounter (Signed)
I spoke with the pt and she wants to know if Dr. Shelle Iron has spoken with the cardiologists. Per last phone note :  Brianna Share, MD 07/08/2012 6:14 PM Signed  Please let pt know that she does not have sleep apnea. Therefore, she needs to have the test done that we discussed where they use a catheter to actually measure the pressure. I will discuss with the cardiologist and get back with her.   Dr. Shelle Iron please advise.[name], CMA

## 2012-07-20 NOTE — Telephone Encounter (Signed)
I have discussed this with Dr. Gala Romney on multiple occasions.  He was supposed to call her to set up RHC Would call his nurse and see if she has the information.

## 2012-07-20 NOTE — Telephone Encounter (Signed)
Carciology closed for the evening. Will call in the a.m.

## 2012-07-21 NOTE — Telephone Encounter (Signed)
I spoke with [name] [phone]), Dr. Prescott Gum nurse. She is working on getting things scheduled for this patient and stated she will give the patient a call today. Pt aware she should hear from cardiology today.

## 2012-07-28 ENCOUNTER — Encounter (HOSPITAL_COMMUNITY): Payer: Self-pay | Admitting: *Deleted

## 2012-07-28 DIAGNOSIS — M5430 Sciatica, unspecified side: Secondary | ICD-10-CM | POA: Diagnosis not present

## 2012-07-28 DIAGNOSIS — S335XXA Sprain of ligaments of lumbar spine, initial encounter: Secondary | ICD-10-CM | POA: Diagnosis not present

## 2012-07-28 DIAGNOSIS — M999 Biomechanical lesion, unspecified: Secondary | ICD-10-CM | POA: Diagnosis not present

## 2012-07-29 ENCOUNTER — Other Ambulatory Visit (HOSPITAL_COMMUNITY): Payer: Self-pay | Admitting: Adult Health

## 2012-07-29 ENCOUNTER — Telehealth (HOSPITAL_COMMUNITY): Payer: Self-pay | Admitting: *Deleted

## 2012-07-29 DIAGNOSIS — I272 Pulmonary hypertension, unspecified: Secondary | ICD-10-CM

## 2012-07-29 NOTE — Telephone Encounter (Signed)
Per Dr Teddy Spike office pt needs RHC to eval pulm htn, cath scheduled for tue 12/17 at 10:30, pt aware and instructions reviewed with her via phone and copy mailed.  Pt is on coumadin, per Dr Gala Romney hold coumadin night before procedure only.  Pt will go to [facility] lab by [facility] on [address] or Friday for labs, order faxed to them at [phone]

## 2012-07-30 DIAGNOSIS — S335XXA Sprain of ligaments of lumbar spine, initial encounter: Secondary | ICD-10-CM | POA: Diagnosis not present

## 2012-07-30 DIAGNOSIS — M999 Biomechanical lesion, unspecified: Secondary | ICD-10-CM | POA: Diagnosis not present

## 2012-07-30 DIAGNOSIS — M5430 Sciatica, unspecified side: Secondary | ICD-10-CM | POA: Diagnosis not present

## 2012-07-30 DIAGNOSIS — R0602 Shortness of breath: Secondary | ICD-10-CM | POA: Diagnosis not present

## 2012-08-04 ENCOUNTER — Encounter (HOSPITAL_BASED_OUTPATIENT_CLINIC_OR_DEPARTMENT_OTHER)
Admission: RE | Disposition: A | Discharge status: Home / Self Care | Payer: Self-pay | Source: Ambulatory Visit | Attending: Internal Medicine

## 2012-08-04 ENCOUNTER — Inpatient Hospital Stay (HOSPITAL_BASED_OUTPATIENT_CLINIC_OR_DEPARTMENT_OTHER)
Admission: RE | Admit: 2012-08-04 | Discharge: 2012-08-04 | Disposition: A | Discharge status: Home / Self Care | Payer: Medicare Other | Source: Ambulatory Visit | Attending: Internal Medicine | Admitting: Internal Medicine

## 2012-08-04 DIAGNOSIS — I2789 Other specified pulmonary heart diseases: Secondary | ICD-10-CM | POA: Diagnosis not present

## 2012-08-04 DIAGNOSIS — Z7901 Long term (current) use of anticoagulants: Secondary | ICD-10-CM | POA: Insufficient documentation

## 2012-08-04 DIAGNOSIS — I4891 Unspecified atrial fibrillation: Secondary | ICD-10-CM | POA: Insufficient documentation

## 2012-08-04 DIAGNOSIS — I2510 Atherosclerotic heart disease of native coronary artery without angina pectoris: Secondary | ICD-10-CM | POA: Insufficient documentation

## 2012-08-04 DIAGNOSIS — I272 Pulmonary hypertension, unspecified: Secondary | ICD-10-CM

## 2012-08-04 DIAGNOSIS — R0609 Other forms of dyspnea: Secondary | ICD-10-CM | POA: Insufficient documentation

## 2012-08-04 DIAGNOSIS — R0989 Other specified symptoms and signs involving the circulatory and respiratory systems: Secondary | ICD-10-CM | POA: Diagnosis not present

## 2012-08-04 DIAGNOSIS — I279 Pulmonary heart disease, unspecified: Secondary | ICD-10-CM

## 2012-08-04 SURGERY — JV RIGHT HEART CATHETERIZATION
Anesthesia: Moderate Sedation

## 2012-08-04 MED ORDER — SODIUM CHLORIDE 0.9 % IJ SOLN: 3.0000 mL | INTRAMUSCULAR | Status: DC | PRN

## 2012-08-04 MED ORDER — SODIUM CHLORIDE 0.9 % IV SOLN: 250.0000 mL | INTRAVENOUS | Status: DC | PRN

## 2012-08-04 MED ORDER — ASPIRIN 81 MG PO CHEW
324.0000 mg | CHEWABLE_TABLET | ORAL | Status: AC
  Administered 2012-08-04: 324 mg via ORAL

## 2012-08-04 MED ORDER — ONDANSETRON HCL 4 MG/2ML IJ SOLN: 4.0000 mg | Freq: Four times a day (QID) | INTRAMUSCULAR | Status: DC | PRN

## 2012-08-04 MED ORDER — SODIUM CHLORIDE 0.9 % IJ SOLN: 3.0000 mL | Freq: Two times a day (BID) | INTRAMUSCULAR | Status: DC

## 2012-08-04 MED ORDER — SODIUM CHLORIDE 0.9 % IV SOLN
INTRAVENOUS | Status: DC
  Administered 2012-08-04: 10:00:00 via INTRAVENOUS

## 2012-08-04 MED ORDER — ACETAMINOPHEN 325 MG PO TABS
650.0000 mg | ORAL_TABLET | ORAL | Status: DC | PRN
  Administered 2012-08-04: 650 mg via ORAL

## 2012-08-04 NOTE — OR Nursing (Signed)
Meal served 

## 2012-08-04 NOTE — OR Nursing (Signed)
Tegaderm dressing applied, site level 0, bedrest begins at 1340

## 2012-08-04 NOTE — OR Nursing (Signed)
Dr Bensimhon at bedside to discuss results and treatment plan with pt and family 

## 2012-08-04 NOTE — H&P (Signed)
Advanced Heart Failure Team History and Physical Note   Primary Physician: Dr Loreta Ave Primary Cardiologist:   Pulmonogist: Dr Shelle Iron    HPI:    [name] is 66 old with a PMH of obesity, pulmonary HTN, Afib (on chronic coumadin), GERD,  CAD, MI 2006 BMS 2006 , and on chronic oxygen 2 liters [place].. Nuclear scan in February 2 012 revealed question of small scar or ischemia in the inferolateral wall versus attenuation. Sleep study negative for sleep apnea.   05/2012 ECHO EF 55-60% Severe biatrial enlargement. Normal RV PA pressure 62 Mild MR. No mention of diastolic parameters  Over the last 4-5 months she has developed increased dyspnea. With mild edema. No CP.  Saw Dr. Shelle Iron and referred for RHC.  She present today for elective outpatient RHC due to ongoing dyspnea with exertion.    Review of Systems: [y] = yes, [ ]  = no   General: Weight gain [ ] ; Weight loss [Y ]; Anorexia [ ] ; Fatigue [ ] ; Fever [ ] ; Chills [ ] ; Weakness [ ]   Cardiac: Chest pain/pressure [ ] ; Resting SOB [ ] ; Exertional SOB [ Y]; Orthopnea [ ] ; Pedal Edema [ ] ; Palpitations [ ] ; Syncope [ ] ; Presyncope [ ] ; Paroxysmal nocturnal dyspnea[ ]   Pulmonary: Cough [ ] ; Wheezing[ ] ; Hemoptysis[ ] ; Sputum [ ] ; Snoring [ ]   GI: Vomiting[ ] ; Dysphagia[ ] ; Melena[ ] ; Hematochezia [ ] ; Heartburn[ ] ; Abdominal pain [ ] ; Constipation [ ] ; Diarrhea [ ] ; BRBPR [ ]   GU: Hematuria[ ] ; Dysuria [ ] ; Nocturia[ ]   Vascular: Pain in legs with walking [ ] ; Pain in feet with lying flat [ ] ; Non-healing sores [ ] ; Stroke [ ] ; TIA [ ] ; Slurred speech [ ] ;  Neuro: Headaches[ ] ; Vertigo[ ] ; Seizures[ ] ; Paresthesias[ ] ;Blurred vision [ ] ; Diplopia [ ] ; Vision changes [ ]   Ortho/Skin: Arthritis [ ] ; Joint pain [ ] ; Muscle pain [ ] ; Joint swelling [ ] ; Back Pain [ ] ; Rash [ ]   Psych: Depression[ ] ; Anxiety[ ]   Heme: Bleeding problems [ ] ; Clotting disorders [ ] ; Anemia [ ]   Endocrine: Diabetes [ ] ; Thyroid dysfunction[ ]   Home Medications Prior  to Admission medications   Medication Sig Start Date End Date Taking? Authorizing Provider  ALPRAZolam Prudy Feeler) 0.5 MG tablet Take 0.75 mg by mouth at bedtime as needed.     Yes Historical Provider, MD  aspirin 81 MG tablet Take 81 mg by mouth daily.     Yes Historical Provider, MD  atorvastatin (LIPITOR) 80 MG tablet Take 0.5 tablets (40 mg total) by mouth daily. 04/08/12  Yes Luis Abed, MD  AZOPT 1 % ophthalmic suspension as directed.  05/02/12  Yes Historical Provider, MD  Biotin 1000 MCG tablet Take 1,000 mcg by mouth daily.     Yes Historical Provider, MD  Cholecalciferol (VITAMIN D3) 1000 UNITS CAPS Take 1 tablet by mouth daily.     Yes Historical Provider, MD  Coenzyme Q10 (COQ10) 50 MG CAPS Take 2 tablets by mouth daily.     Yes Historical Provider, MD  fexofenadine (ALLEGRA) 180 MG tablet Take 180 mg by mouth as needed.     Yes Historical Provider, MD  furosemide (LASIX) 40 MG tablet Take two tabs daily   Yes Historical Provider, MD  HYDROcodone-acetaminophen (VICODIN) 5-500 MG per tablet Take 0.5 tablets by mouth every 4 (four) hours as needed.     Yes Historical Provider, MD  latanoprost (XALATAN) 0.005 % ophthalmic solution as directed.  03/30/12  Yes Historical Provider, MD  Multiple Vitamin (MULTIVITAMIN) capsule Take 1 capsule by mouth daily.     Yes Historical Provider, MD  Omega-3 Fatty Acids (FISH OIL) 1000 MG CAPS Take 1 capsule by mouth 2 (two) times daily.     Yes Historical Provider, MD  potassium chloride SA (K-DUR,KLOR-CON) 20 MEQ tablet Take 20 mEq by mouth daily.     Yes Historical Provider, MD  valsartan (DIOVAN) 320 MG tablet Take 1 tablet (320 mg total) by mouth daily. 01/27/12  Yes Luis Abed, MD  warfarin (COUMADIN) 5 MG tablet Take 1 tablet (5 mg total) by mouth as directed. 07/13/12  Yes Luis Abed, MD  albuterol (PROVENTIL) 2 MG tablet Take 2 mg by mouth 3 (three) times daily as needed.     Historical Provider, MD  famotidine (PEPCID) 20 MG tablet Take 20  mg by mouth as needed.      Historical Provider, MD  metoprolol succinate (TOPROL-XL) 25 MG 24 hr tablet TAKE 1 TABLET BY MOUTH EVERY DAY - EMERGENCY REFILL FAXED DR 01/07/12   Luis Abed, MD  mometasone (NASONEX) 50 MCG/ACT nasal spray Place 2 sprays into the nose daily.    Historical Provider, MD  nitroGLYCERIN (NITROSTAT) 0.4 MG SL tablet Place 1 tablet (0.4 mg total) under the tongue every 5 (five) minutes as needed. 06/11/12   Luis Abed, MD  NON FORMULARY OXygen at night    Historical Provider, MD  omeprazole (PRILOSEC) 20 MG capsule Take 40 mg by mouth daily.      Historical Provider, MD    Past Medical History: Past Medical History  Diagnosis Date  . Hypertension   . Dyslipidemia   . Atrial fibrillation   . CAD (coronary artery disease)     Bare-metal stent December, 2006 /  Nuclear, October 11, 2010, question of small scar or ischemia in the inferolateral wall versus attenuation  . Degenerative arthritis of right knee   . Morbid obesity   . Psoriasis   . Carotid bruit     doppler, 2012, no significant abnormality  . Ejection fraction     EF 55-60%, echo, August, 2012  . Mitral regurgitation     Mild, echo, August, 2012  . Right ventricular dysfunction     Mild dilatation of the right ventricle, mild decreased right ventricular function, echo, August, 2012  . Tricuspid regurgitation     Severe, echo, August of 2012  . Pulmonary hypertension     Severe, 93 mmHg, echo, August, 2012 and  . Warfarin anticoagulation   . Partial, of this data file.   [name] Shortness of breath   . GERD (gastroesophageal reflux disease)   . Neuromuscular disorder     rt carpal tunnel    Past Surgical History: Past Surgical History  Procedure Date  . Cholecystectomy   . Right ankle surgery   . Cardiac catheterization   . Coronary angioplasty 2006    stent  . Colonoscopy   . Carpal tunnel release 11/29/2011    Procedure: CARPAL TUNNEL RELEASE;  Surgeon: Wyn Forster., MD;   Location: [facility];  Service: Orthopedics;  Laterality: Right;    Family History: Family History  Problem Relation Age of Onset  . Heart attack Mother   . Coronary artery disease Father   . Stroke Father   . Heart attack Brother   . Heart failure Brother   . COPD Brother   . Emphysema Sister  smoked  . Emphysema Brother     smoked    Social History: History   Social History  . Marital Status: Married    Spouse Name: N/A    Number of Children: 2  . Years of Education: N/A   Occupational History  . retired    Social History Main Topics  . Smoking status: Former Smoker -- 0.3 packs/day for .5 years    Types: Cigarettes    Quit date: 08/19/1986  . Smokeless tobacco: Never Used     Comment: pt only smoked for only a few months.  . Alcohol Use: No  . Drug Use: No  . Sexually Active: Not on file   Other Topics Concern  . Not on file   Social History Narrative  . No narrative on file    Allergies:  No Known Allergies  Objective:    Vital Signs:   Pulse Rate:  [63] 63  (12/17 1016) Resp:  [16] 16  (12/17 1016) BP: (137)/(71) 137/71 mmHg (12/17 1016) SpO2:  [90 %] 90 % (12/17 1016) Weight:  [118.389 kg (261 lb)] 118.389 kg (261 lb) (12/17 1016)   Filed Weights   08/04/12 1016  Weight: 118.389 kg (261 lb)    Physical Exam: General:  Well appearing. No resp difficulty HEENT: normal Neck: supple. JVP 6-7 Carotids 2+ bilat; no bruits. No lymphadenopathy or thryomegaly appreciated. Cor: PMI nondisplaced. Irregular rate & rhythm. No rubs, gallops or murmurs. Lungs: clear Abdomen: soft, obesity, nontender, nondistended. No hepatosplenomegaly. No bruits or masses. Good bowel sounds. Extremities: no cyanosis, clubbing, rash, edema Neuro: alert & orientedx3, cranial nerves grossly intact. moves all 4 extremities w/o difficulty. Affect pleasant   Assessment:   1. Dyspnea with PHTN on echo 2. CAD - s/p BMS 2006 3. Pulmonary HTN 4. A fib -  chronic coumadin 5. GERD   Plan/Discussion:    [name] presents today for elective heart catherization due to progressive dyspnea with PH on echo. I suspect she may have restrictive physiology and PH may be more left sided in nature. Plan for  cath to further evaluate hemodynamics.     Length of Stay: 0   Arvilla Meres 08/04/2012, 12:53 PM

## 2012-08-04 NOTE — CV Procedure (Signed)
Cardiac Cath Procedure Note:  Indication:  Dyspnea. Pulmonary HTN on echo.   Procedures performed:  1) Right heart catheterization  Description of procedure:   The risks and indication of the procedure were explained. Consent was signed and placed on the chart. An appropriate timeout was taken prior to the procedure. The right groin was prepped and draped in the routine sterile fashion and anesthetized with 1% local lidocaine.   A 7 FR venous sheath was placed in the right femoral vein using a modified Seldinger technique. A standard Swan-Ganz catheter was used for the procedure. Given the size of her RA it was very difficult to maneuver the catheter into the PA and PCW position. An 0.025 wire was used for support.   Complications: None apparent.  Findings:  RA = 8 RV = 73/9/10 PA = 67/21 (40) PCW = 14 (v = 21) Fick cardiac output/index = 4.7/2.1 PVR = 5.6 Woods units FA sat = 94% PA sat = 61%, 61%  Assessment: 1) Moderate pulmonary HTN with normal PCWP  Plan/Discussion:  I suspect she has mixed PAH. Echo findings suggestive of restrictive physiology. However, PVR is moderately elevated and could consider trial of pulmonary vasodilator like sildenafil. Will d/w Dr. Shelle Iron.  Daniel Bensimhon 1:28 PM

## 2012-08-06 LAB — POCT I-STAT 3, VENOUS BLOOD GAS (G3P V)
Acid-base deficit: 1 mmol/L (ref 0.0–2.0)
Bicarbonate: 24.9 mEq/L — ABNORMAL HIGH (ref 20.0–24.0)
O2 Saturation: 61 %
TCO2: 26 mmol/L (ref 0–100)
pO2, Ven: 32 mmHg (ref 30.0–45.0)

## 2012-08-06 LAB — POCT I-STAT 3, ART BLOOD GAS (G3+)
Bicarbonate: 23.4 mEq/L (ref 20.0–24.0)
TCO2: 25 mmol/L (ref 0–100)
pH, Arterial: 7.379 (ref 7.350–7.450)
pO2, Arterial: 70 mmHg — ABNORMAL LOW (ref 80.0–100.0)

## 2012-08-10 ENCOUNTER — Telehealth: Payer: Self-pay | Admitting: Cardiology

## 2012-08-10 ENCOUNTER — Telehealth: Payer: Self-pay | Admitting: Pulmonary Disease

## 2012-08-10 MED ORDER — ATORVASTATIN CALCIUM 80 MG PO TABS: 40.0000 mg | ORAL_TABLET | Freq: Every day | ORAL | Status: DC

## 2012-08-10 NOTE — Telephone Encounter (Signed)
New Problem:    Patient called in needing a refill of her atorvastatin (LIPITOR) 80 MG tablet.

## 2012-08-10 NOTE — Telephone Encounter (Signed)
Please let pt know that I spoke personally with Dr. Gala Romney this weekend.  I would like to see her in office to discuss results to make sure that we are on same page.  The good news is that her pressure is only moderately elevated.  Please arrange for ov with me.

## 2012-08-10 NOTE — Telephone Encounter (Signed)
Spoke with pt and notified of recs per [facility] She verbalized understanding Appt with [facility] scheduled for 08/27/11

## 2012-08-10 NOTE — Telephone Encounter (Signed)
KC, have you heard from Dr. Milas Kocher regarding this pt? Please advise, thanks!

## 2012-08-20 ENCOUNTER — Encounter: Payer: Self-pay | Admitting: Internal Medicine

## 2012-08-23 ENCOUNTER — Encounter: Payer: Self-pay | Admitting: Cardiology

## 2012-08-24 ENCOUNTER — Ambulatory Visit (INDEPENDENT_AMBULATORY_CARE_PROVIDER_SITE_OTHER): Payer: Medicare Other | Admitting: Cardiology

## 2012-08-24 ENCOUNTER — Encounter: Payer: Self-pay | Admitting: Cardiology

## 2012-08-24 VITALS — BP 142/80 | HR 62 | Ht 65.0 in | Wt 241.0 lb

## 2012-08-24 DIAGNOSIS — I2510 Atherosclerotic heart disease of native coronary artery without angina pectoris: Secondary | ICD-10-CM

## 2012-08-24 DIAGNOSIS — I2721 Secondary pulmonary arterial hypertension: Secondary | ICD-10-CM

## 2012-08-24 DIAGNOSIS — Z7901 Long term (current) use of anticoagulants: Secondary | ICD-10-CM | POA: Diagnosis not present

## 2012-08-24 DIAGNOSIS — I2789 Other specified pulmonary heart diseases: Secondary | ICD-10-CM

## 2012-08-24 DIAGNOSIS — I10 Essential (primary) hypertension: Secondary | ICD-10-CM

## 2012-08-24 DIAGNOSIS — I4891 Unspecified atrial fibrillation: Secondary | ICD-10-CM

## 2012-08-24 NOTE — Assessment & Plan Note (Signed)
Blood pressure is controlled. No change in therapy. 

## 2012-08-24 NOTE — Progress Notes (Signed)
HPI   Patient is seen today to followup for cardiac status. I saw her last September, 2013. The patient has atrial fibrillation that is chronic. Her rate is controlled. She is on Coumadin. She has normal systolic left ventricular function. Her diastolic function is difficult to assess with her atrial fibrillation, But there is data suggesting significant diastolic dysfunction.. She has significant pulmonary hypertension. Right heart catheterization was done recently by Dr.Bensimhon. He felt that her pulmonary hypertension is probably in the mixed. Diastolic dysfunction is playing a role. However her pulmonary resistance is increased and she may show some response to medications for this. She will be seeing Dr. Shelle Iron in the next few days to review this issue further. In addition she has had a sleep study.  No Known Allergies  Current Outpatient Prescriptions  Medication Sig Dispense Refill  . albuterol (PROVENTIL) 2 MG tablet Take 2 mg by mouth 3 (three) times daily as needed.       . ALPRAZolam (XANAX) 0.5 MG tablet Take 0.75 mg by mouth at bedtime as needed.        [name] aspirin 81 MG tablet Take 81 mg by mouth daily.        [name] atorvastatin (LIPITOR) 80 MG tablet Take 0.5 tablets (40 mg total) by mouth daily.  45 tablet  2  . AZOPT 1 % ophthalmic suspension as directed.       . Biotin 1000 MCG tablet Take 1,000 mcg by mouth daily.        . Cholecalciferol (VITAMIN D3) 1000 UNITS CAPS Take 1 tablet by mouth daily.        . Coenzyme Q10 (COQ10) 50 MG CAPS Take 2 tablets by mouth daily.        . famotidine (PEPCID) 20 MG tablet Take 20 mg by mouth as needed.        . fexofenadine (ALLEGRA) 180 MG tablet Take 180 mg by mouth as needed.        . furosemide (LASIX) 40 MG tablet Take two tabs daily      . HYDROcodone-acetaminophen (VICODIN) 5-500 MG per tablet Take 0.5 tablets by mouth every 4 (four) hours as needed.        . latanoprost (XALATAN) 0.005 % ophthalmic solution as directed.       .  mometasone (NASONEX) 50 MCG/ACT nasal spray Place 2 sprays into the nose daily.      . Multiple Vitamin (MULTIVITAMIN) capsule Take 1 capsule by mouth daily.        . nitroGLYCERIN (NITROSTAT) 0.4 MG SL tablet Place 1 tablet (0.4 mg total) under the tongue every 5 (five) minutes as needed.  100 tablet  3  . NON FORMULARY OXygen at night      . omeprazole (PRILOSEC) 20 MG capsule Take 40 mg by mouth daily.        . potassium chloride SA (K-DUR,KLOR-CON) 20 MEQ tablet Take 20 mEq by mouth daily.        . valsartan (DIOVAN) 320 MG tablet Take 1 tablet (320 mg total) by mouth daily.  30 tablet  6  . warfarin (COUMADIN) 5 MG tablet Take 1 tablet (5 mg total) by mouth as directed.  45 tablet  1    History   Social History  . Marital Status: Married    Spouse Name: N/A    Number of Children: 2  . Years of Education: N/A   Occupational History  . retired    Social History Main  Topics  . Smoking status: Former Smoker -- 0.3 packs/day for .5 years    Types: Cigarettes    Quit date: 08/19/1986  . Smokeless tobacco: Never Used     Comment: pt only smoked for only a few months.  . Alcohol Use: No  . Drug Use: No  . Sexually Active: Not on file   Other Topics Concern  . Not on file   Social History Narrative  . No narrative on file    Family History  Problem Relation Age of Onset  . Heart attack Mother   . Coronary artery disease Father   . Stroke Father   . Heart attack Brother   . Heart failure Brother   . COPD Brother   . Emphysema Sister     smoked  . Emphysema Brother     smoked    Past Medical History  Diagnosis Date  . Hypertension   . Dyslipidemia   . Atrial fibrillation   . CAD (coronary artery disease)     Bare-metal stent December, 2006 /  Nuclear, October 11, 2010, question of small scar or ischemia in the inferolateral wall versus attenuation  . Degenerative arthritis of right knee   . Morbid obesity   . Psoriasis   . Carotid bruit     doppler, 2012, no  significant abnormality  . Ejection fraction     EF 55-60%, echo, August, 2012  . Mitral regurgitation     Mild, echo, August, 2012  . Right ventricular dysfunction     Mild dilatation of the right ventricle, mild decreased right ventricular function, echo, August, 2012  . Tricuspid regurgitation     Severe, echo, August of 2012  . Pulmonary hypertension     Severe, 93 mmHg, echo, August, 2012 and  . Warfarin anticoagulation   . Partial, of this data file.   [name] Shortness of breath   . GERD (gastroesophageal reflux disease)   . Neuromuscular disorder     rt carpal tunnel    Past Surgical History  Procedure Date  . Cholecystectomy   . Right ankle surgery   . Cardiac catheterization   . Coronary angioplasty 2006    stent  . Colonoscopy   . Carpal tunnel release 11/29/2011    Procedure: CARPAL TUNNEL RELEASE;  Surgeon: Wyn Forster., MD;  Location: [facility];  Service: Orthopedics;  Laterality: Right;    Patient Active Problem List  Diagnosis  . DYSLIPIDEMIA  . Overweight  . HYPERTENSION  . GERD  . COLONIC POLYPS, HX OF  . Atrial fibrillation  . Encounter for long-term (current) use of anticoagulants  . Pulmonary arterial hypertension  . Psoriasis  . Carotid bruit  . Warfarin anticoagulation  . CAD (coronary artery disease)  . DOE (dyspnea on exertion)    ROS    Patient denies fever, chills, headache, sweats, rash, change in vision, change in hearing, chest pain, cough, nausea vomiting, urinary symptoms. All other systems are reviewed and are negative.  PHYSICAL EXAM   Patient is here with her husband. She is oriented to person time and place. Affect is normal. Lungs are clear. There is no jugulovenous distention. She is overweight. Cardiac exam reveals S1 and S2. There no clicks or significant murmurs. The abdomen is soft. Is no peripheral edema. There no musculoskeletal deformities. There are no skin rashes.  Filed Vitals:   08/24/12 1210  BP:  142/80  Pulse: 62  Height: 5\' 5"  (1.651 m)  Weight: 241  lb (109.317 kg)   EKG is done today and reviewed by me. There is atrial fibrillation with a controlled response.  ASSESSMENT & PLAN

## 2012-08-24 NOTE — Patient Instructions (Addendum)
Your physician wants you to follow-up in: 3 months You will receive a reminder letter in the mail two months in advance. If you don't receive a letter, please call our office to schedule the follow-up appointment.  Your physician recommends that you continue on your current medications as directed. Please refer to the Current Medication list given to you today.   

## 2012-08-24 NOTE — Assessment & Plan Note (Signed)
Patient continues on Coumadin for her atrial fibrillation. 

## 2012-08-24 NOTE — Assessment & Plan Note (Addendum)
I have carefully reviewed all the information concerning the ongoing workup. I will wait for further input from Dr. Shelle Iron concerning whether medications can be used for her pulmonary hypertension. Also whether sleep apnea is playing a role here. I had a long discussion with the patient and her husband about her overall cardiac status. I explained that the squeezing function of her heart was good. I tried to explain the concept of diastolic dysfunction. I explained that I thought that many factors were playing a role. Her husband asked if this problem would cause strokes or heart attacks. My answer was no.  As part of today's evaluation I spent greater than 25 minutes with the patient and her husband. More than half of this time was involved with direct discussion with them about her issues.

## 2012-08-24 NOTE — Assessment & Plan Note (Signed)
Atrial fibrillation rate is controlled. No change in therapy. I have adjusted her meds recently when her rate was on the slow side.

## 2012-08-24 NOTE — Assessment & Plan Note (Signed)
Coronary disease is stable. No change in therapy. 

## 2012-08-26 ENCOUNTER — Encounter: Payer: Self-pay | Admitting: Pulmonary Disease

## 2012-08-26 ENCOUNTER — Ambulatory Visit (INDEPENDENT_AMBULATORY_CARE_PROVIDER_SITE_OTHER): Payer: Medicare Other | Admitting: Pulmonary Disease

## 2012-08-26 VITALS — BP 128/88 | HR 60 | Temp 97.7°F | Ht 65.0 in | Wt 244.6 lb

## 2012-08-26 DIAGNOSIS — I2721 Secondary pulmonary arterial hypertension: Secondary | ICD-10-CM

## 2012-08-26 DIAGNOSIS — I2789 Other specified pulmonary heart diseases: Secondary | ICD-10-CM

## 2012-08-26 NOTE — Assessment & Plan Note (Signed)
The patient's right heart catheter shows moderate pulmonary hypertension with an adequate pulmonary capillary wedge pressure but significant V-wave.  Her pulmonary vascular resistance was elevated.  She is felt to have both pulmonary venous and arterial hypertension, and I have reviewed the various treatments for these.  I am still suspicious she may have chronic thromboembolic disease, but she is already on anticoagulation.  Her VQ scan showed small defects, and therefore I do not think she is in the range of requiring embolectomy.  I also think her morbid obesity with nocturnal hypoxemia has contributed to her pulmonary arterial hypertension as well.  She does not have sleep apnea by her recent study.  Finally, she is felt to have an element of diastolic dysfunction that is contributing to her pulmonary hypertension.  I have discussed her case in detail with Dr. Gala Romney, and both of [place] feel that she is not a good candidate for vasodilator therapy at this time.  I have asked her to work aggressively on weight loss, wearing her oxygen at night, keeping her atrial fibrillation and fluid balance controlled, and staying on her chronic anticoagulation.

## 2012-08-26 NOTE — Progress Notes (Signed)
  Subjective:    Patient ID: [name], female    DOB: Mar 30, 1935, 78 y.o.   MRN: [id number]  HPI The patient comes in today for followup of her recent right heart catheterization and sleep study.  She was found to have moderate pulmonary hypertension is felt to be mixed, and surprisingly her sleep study did not show significant sleep apnea.  I have had a long discussion with her about her studies, and discussed various recommendations.   Review of Systems  Constitutional: Negative for fever and unexpected weight change.  HENT: Positive for trouble swallowing and postnasal drip. Negative for ear pain, nosebleeds, congestion, sore throat, rhinorrhea, sneezing, dental problem and sinus pressure.   Eyes: Negative for redness and itching.  Respiratory: Positive for choking. Negative for cough, chest tightness, shortness of breath and wheezing.   Cardiovascular: Negative for palpitations and leg swelling.  Gastrointestinal: Negative for nausea and vomiting.  Genitourinary: Negative for dysuria.  Musculoskeletal: Negative for joint swelling.  Skin: Negative for rash.  Neurological: Negative for headaches.  Hematological: Does not bruise/bleed easily.  Psychiatric/Behavioral: Negative for dysphoric mood. The patient is not nervous/anxious.        Objective:   Physical Exam Obese female in no acute distress Nose without purulence or discharge noted Neck without lymphadenopathy or thyromegaly Lower extremities with mild edema, no cyanosis Alert and oriented, moves all 4 extremities.       Assessment & Plan:

## 2012-08-26 NOTE — Patient Instructions (Addendum)
Continue work on weight loss.  This is an important treatment for you. Stay on oxygen at night, and will recheck your level to make sure you are getting enough. Stay on your blood thinner. followup with me in 4 mos to check on your progress.

## 2012-09-02 ENCOUNTER — Ambulatory Visit (INDEPENDENT_AMBULATORY_CARE_PROVIDER_SITE_OTHER): Payer: Medicare Other | Admitting: *Deleted

## 2012-09-02 DIAGNOSIS — I4891 Unspecified atrial fibrillation: Secondary | ICD-10-CM | POA: Diagnosis not present

## 2012-09-02 DIAGNOSIS — Z8679 Personal history of other diseases of the circulatory system: Secondary | ICD-10-CM | POA: Diagnosis not present

## 2012-09-02 DIAGNOSIS — Z7901 Long term (current) use of anticoagulants: Secondary | ICD-10-CM | POA: Diagnosis not present

## 2012-09-02 LAB — POCT INR: INR: 1.9

## 2012-09-15 ENCOUNTER — Telehealth: Payer: Self-pay | Admitting: Pulmonary Disease

## 2012-09-15 ENCOUNTER — Encounter: Payer: Self-pay | Admitting: Pulmonary Disease

## 2012-09-15 NOTE — Telephone Encounter (Signed)
Please let pt know that her overnight oxygen levels are adequate on her 2 liters.  Continue with this.

## 2012-09-15 NOTE — Telephone Encounter (Signed)
Pt aware of ONO results per [facility] and to stay at 2L O2 at night, patient expressed understanding, nothing further needed.

## 2012-09-23 ENCOUNTER — Ambulatory Visit (INDEPENDENT_AMBULATORY_CARE_PROVIDER_SITE_OTHER): Payer: Medicare Other | Admitting: *Deleted

## 2012-09-23 DIAGNOSIS — Z8679 Personal history of other diseases of the circulatory system: Secondary | ICD-10-CM | POA: Diagnosis not present

## 2012-09-23 DIAGNOSIS — I4891 Unspecified atrial fibrillation: Secondary | ICD-10-CM

## 2012-09-23 DIAGNOSIS — Z7901 Long term (current) use of anticoagulants: Secondary | ICD-10-CM

## 2012-09-23 LAB — POCT INR: INR: 1.7

## 2012-09-28 ENCOUNTER — Encounter: Payer: Self-pay | Admitting: Pulmonary Disease

## 2012-09-28 ENCOUNTER — Other Ambulatory Visit: Payer: Self-pay | Admitting: Cardiology

## 2012-09-28 MED ORDER — VALSARTAN 320 MG PO TABS: 320.0000 mg | ORAL_TABLET | Freq: Every day | ORAL | Status: DC

## 2012-10-05 ENCOUNTER — Telehealth: Payer: Self-pay | Admitting: Pulmonary Disease

## 2012-10-05 NOTE — Telephone Encounter (Signed)
Spoke with pt  She states that she was told that the long term NF denied her entry since she has COPD on her records from her PCP She states was never told that she had COPD  I advised may need to ask her PCP this, since we do not have her as having COPD according to our records Pt verbalized understanding and states nothing further needed

## 2012-10-07 ENCOUNTER — Ambulatory Visit (INDEPENDENT_AMBULATORY_CARE_PROVIDER_SITE_OTHER): Payer: Medicare Other | Admitting: *Deleted

## 2012-10-07 DIAGNOSIS — Z7901 Long term (current) use of anticoagulants: Secondary | ICD-10-CM

## 2012-10-07 DIAGNOSIS — Z8679 Personal history of other diseases of the circulatory system: Secondary | ICD-10-CM | POA: Diagnosis not present

## 2012-10-08 ENCOUNTER — Other Ambulatory Visit: Payer: Self-pay | Admitting: Cardiology

## 2012-10-08 MED ORDER — WARFARIN SODIUM 5 MG PO TABS: 5.0000 mg | ORAL_TABLET | ORAL | Status: DC

## 2012-10-12 DIAGNOSIS — H402290 Chronic angle-closure glaucoma, unspecified eye, stage unspecified: Secondary | ICD-10-CM | POA: Diagnosis not present

## 2012-10-13 DIAGNOSIS — M159 Polyosteoarthritis, unspecified: Secondary | ICD-10-CM | POA: Diagnosis not present

## 2012-10-27 DIAGNOSIS — H402290 Chronic angle-closure glaucoma, unspecified eye, stage unspecified: Secondary | ICD-10-CM | POA: Diagnosis not present

## 2012-10-27 DIAGNOSIS — H26499 Other secondary cataract, unspecified eye: Secondary | ICD-10-CM | POA: Diagnosis not present

## 2012-10-27 DIAGNOSIS — H409 Unspecified glaucoma: Secondary | ICD-10-CM | POA: Diagnosis not present

## 2012-10-27 DIAGNOSIS — H2510 Age-related nuclear cataract, unspecified eye: Secondary | ICD-10-CM | POA: Diagnosis not present

## 2012-10-28 ENCOUNTER — Ambulatory Visit (INDEPENDENT_AMBULATORY_CARE_PROVIDER_SITE_OTHER): Payer: Medicare Other | Admitting: *Deleted

## 2012-10-28 DIAGNOSIS — Z8679 Personal history of other diseases of the circulatory system: Secondary | ICD-10-CM | POA: Diagnosis not present

## 2012-10-28 DIAGNOSIS — I4891 Unspecified atrial fibrillation: Secondary | ICD-10-CM

## 2012-10-28 DIAGNOSIS — Z7901 Long term (current) use of anticoagulants: Secondary | ICD-10-CM

## 2012-11-10 ENCOUNTER — Telehealth: Payer: Self-pay | Admitting: Pulmonary Disease

## 2012-11-10 NOTE — Telephone Encounter (Signed)
ATC NA unable to leave VM WCB 

## 2012-11-11 NOTE — Telephone Encounter (Signed)
Spoke with pt She states needs [facility] to write a letter stating her dx  This is for insurance purposes She is requesting that this be mailed to her home address which I have already verified with her Please advise thanks!

## 2012-11-12 ENCOUNTER — Other Ambulatory Visit (HOSPITAL_COMMUNITY): Payer: Self-pay | Admitting: Family Medicine

## 2012-11-12 DIAGNOSIS — E669 Obesity, unspecified: Secondary | ICD-10-CM | POA: Diagnosis not present

## 2012-11-12 DIAGNOSIS — M810 Age-related osteoporosis without current pathological fracture: Secondary | ICD-10-CM | POA: Diagnosis not present

## 2012-11-12 DIAGNOSIS — G8929 Other chronic pain: Secondary | ICD-10-CM | POA: Diagnosis not present

## 2012-11-12 DIAGNOSIS — E785 Hyperlipidemia, unspecified: Secondary | ICD-10-CM | POA: Diagnosis not present

## 2012-11-16 ENCOUNTER — Ambulatory Visit (HOSPITAL_COMMUNITY)
Admission: RE | Admit: 2012-11-16 | Discharge: 2012-11-16 | Disposition: A | Discharge status: Home / Self Care | Payer: Medicare Other | Source: Ambulatory Visit | Attending: Family Medicine | Admitting: Family Medicine

## 2012-11-16 DIAGNOSIS — M810 Age-related osteoporosis without current pathological fracture: Secondary | ICD-10-CM | POA: Diagnosis not present

## 2012-11-16 DIAGNOSIS — G8929 Other chronic pain: Secondary | ICD-10-CM

## 2012-11-16 DIAGNOSIS — Z780 Asymptomatic menopausal state: Secondary | ICD-10-CM | POA: Diagnosis not present

## 2012-11-20 ENCOUNTER — Telehealth: Payer: Self-pay | Admitting: Cardiology

## 2012-11-20 NOTE — Telephone Encounter (Signed)
New problem   Pt dropped off a form from [employer] [place] at front desk, about getting Nursing Home Care in case she need it 3wks ago. Pt want to know if Dr has filled out forms. Please call pt concerning this matter.

## 2012-11-20 NOTE — Telephone Encounter (Signed)
Pt is in search of forms that were left for Dr. Myrtis Ser to fill out 3 weeks ago.

## 2012-11-24 NOTE — Telephone Encounter (Signed)
Patient was notified that medical records and Dr Myrtis Ser have not received the papers.  She states she will bring another copy with her when her husband has an appt with Dr Myrtis Ser on Thursday.

## 2012-11-24 NOTE — Telephone Encounter (Signed)
NA

## 2012-11-25 ENCOUNTER — Ambulatory Visit (INDEPENDENT_AMBULATORY_CARE_PROVIDER_SITE_OTHER): Payer: Medicare Other | Admitting: *Deleted

## 2012-11-25 DIAGNOSIS — Z7901 Long term (current) use of anticoagulants: Secondary | ICD-10-CM

## 2012-11-25 DIAGNOSIS — I4891 Unspecified atrial fibrillation: Secondary | ICD-10-CM

## 2012-11-25 DIAGNOSIS — Z8679 Personal history of other diseases of the circulatory system: Secondary | ICD-10-CM | POA: Diagnosis not present

## 2012-12-01 DIAGNOSIS — H2510 Age-related nuclear cataract, unspecified eye: Secondary | ICD-10-CM | POA: Diagnosis not present

## 2012-12-01 DIAGNOSIS — H26499 Other secondary cataract, unspecified eye: Secondary | ICD-10-CM | POA: Diagnosis not present

## 2012-12-01 DIAGNOSIS — H402290 Chronic angle-closure glaucoma, unspecified eye, stage unspecified: Secondary | ICD-10-CM | POA: Diagnosis not present

## 2012-12-23 DIAGNOSIS — H2510 Age-related nuclear cataract, unspecified eye: Secondary | ICD-10-CM | POA: Diagnosis not present

## 2012-12-23 DIAGNOSIS — H402290 Chronic angle-closure glaucoma, unspecified eye, stage unspecified: Secondary | ICD-10-CM | POA: Diagnosis not present

## 2012-12-31 ENCOUNTER — Ambulatory Visit (INDEPENDENT_AMBULATORY_CARE_PROVIDER_SITE_OTHER): Payer: Medicare Other | Admitting: *Deleted

## 2012-12-31 DIAGNOSIS — I4891 Unspecified atrial fibrillation: Secondary | ICD-10-CM

## 2012-12-31 DIAGNOSIS — Z7901 Long term (current) use of anticoagulants: Secondary | ICD-10-CM | POA: Diagnosis not present

## 2012-12-31 DIAGNOSIS — Z8679 Personal history of other diseases of the circulatory system: Secondary | ICD-10-CM

## 2013-01-05 ENCOUNTER — Ambulatory Visit (INDEPENDENT_AMBULATORY_CARE_PROVIDER_SITE_OTHER): Payer: Medicare Other | Admitting: Pulmonary Disease

## 2013-01-05 ENCOUNTER — Encounter: Payer: Self-pay | Admitting: Pulmonary Disease

## 2013-01-05 VITALS — BP 128/72 | HR 52 | Temp 98.0°F | Ht 61.0 in | Wt 226.2 lb

## 2013-01-05 DIAGNOSIS — I2789 Other specified pulmonary heart diseases: Secondary | ICD-10-CM | POA: Diagnosis not present

## 2013-01-05 DIAGNOSIS — I2721 Secondary pulmonary arterial hypertension: Secondary | ICD-10-CM

## 2013-01-05 NOTE — Patient Instructions (Addendum)
Stay on oxygen at night Continue working on weight loss.  You are doing fantastic! Keep up with fluid balance and blood pressure control. followup with me in 6mos if doing well.

## 2013-01-05 NOTE — Assessment & Plan Note (Signed)
The patient has a history of moderate pulmonary hypertension that is multifactorial, as well as dyspnea on exertion.  This is felt secondary to thromboembolic disease, nocturnal hypoxemia, her significant obesity, as well as an element of diastolic dysfunction.  I have asked her to continue on chronic anticoagulation, wearing oxygen at night, and to monitor her fluid balance carefully.  She has lost almost 20 pounds since the last visit, and I have congratulated her on this.  I suspect this will help her more than anything else over time.

## 2013-01-05 NOTE — Progress Notes (Signed)
  Subjective:    Patient ID: [name], female    DOB: [birth year], 78 y.o.   MRN: [id number]  HPI The patient comes in today for followup of her dyspnea on exertion, felt secondary to her pulmonary hypertension and various other conditions.  Her pulmonary hypertension is felt secondary to morbid obesity, nocturnal hypoxemia, diastolic dysfunction, and probable chronic thromboembolic disease.  She has been staying on chronic anticoagulation, as well as oxygen at night.  She continues to have lower extremity edema, but is monitoring it closely.  She has lost almost 20 pounds since the last visit, and feels this has definitely helped her breathing.   Review of Systems  Constitutional: Negative for fever and unexpected weight change.  HENT: Positive for postnasal drip. Negative for ear pain, nosebleeds, congestion, sore throat, rhinorrhea, sneezing, trouble swallowing, dental problem and sinus pressure.   Eyes: Negative for redness and itching.  Respiratory: Negative for cough, chest tightness, shortness of breath and wheezing.   Cardiovascular: Negative for palpitations and leg swelling.  Gastrointestinal: Negative for nausea and vomiting.  Genitourinary: Negative for dysuria.  Musculoskeletal: Negative for joint swelling.  Skin: Negative for rash.  Neurological: Negative for headaches.  Hematological: Does not bruise/bleed easily.  Psychiatric/Behavioral: Negative for dysphoric mood. The patient is nervous/anxious.        Objective:   Physical Exam Obese female in no acute distress Nose without purulence or discharge noted Neck without lymphadenopathy or thyromegaly Chest totally clear to auscultation, no wheezing Cardiac exam with regular rate and rhythm Lower extremities with 1+ edema bilaterally, no cyanosis Alert and oriented, moves all 4 extremities.       Assessment & Plan:

## 2013-01-25 ENCOUNTER — Ambulatory Visit (INDEPENDENT_AMBULATORY_CARE_PROVIDER_SITE_OTHER): Payer: Medicare Other | Admitting: *Deleted

## 2013-01-25 DIAGNOSIS — I4891 Unspecified atrial fibrillation: Secondary | ICD-10-CM | POA: Diagnosis not present

## 2013-01-25 DIAGNOSIS — Z7901 Long term (current) use of anticoagulants: Secondary | ICD-10-CM

## 2013-01-25 DIAGNOSIS — Z8679 Personal history of other diseases of the circulatory system: Secondary | ICD-10-CM | POA: Diagnosis not present

## 2013-01-28 ENCOUNTER — Other Ambulatory Visit: Payer: Self-pay | Admitting: Cardiology

## 2013-02-02 DIAGNOSIS — M999 Biomechanical lesion, unspecified: Secondary | ICD-10-CM | POA: Diagnosis not present

## 2013-02-02 DIAGNOSIS — M5430 Sciatica, unspecified side: Secondary | ICD-10-CM | POA: Diagnosis not present

## 2013-02-02 DIAGNOSIS — M47817 Spondylosis without myelopathy or radiculopathy, lumbosacral region: Secondary | ICD-10-CM | POA: Diagnosis not present

## 2013-02-11 ENCOUNTER — Emergency Department (HOSPITAL_COMMUNITY): Payer: Medicare Other

## 2013-02-11 ENCOUNTER — Emergency Department (HOSPITAL_COMMUNITY)
Admission: EM | Admit: 2013-02-11 | Discharge: 2013-02-11 | Disposition: A | Discharge status: Home / Self Care | Payer: Medicare Other | Attending: Emergency Medicine | Admitting: Emergency Medicine

## 2013-02-11 ENCOUNTER — Encounter (HOSPITAL_COMMUNITY): Payer: Self-pay | Admitting: *Deleted

## 2013-02-11 DIAGNOSIS — I2789 Other specified pulmonary heart diseases: Secondary | ICD-10-CM | POA: Insufficient documentation

## 2013-02-11 DIAGNOSIS — Z7982 Long term (current) use of aspirin: Secondary | ICD-10-CM | POA: Diagnosis not present

## 2013-02-11 DIAGNOSIS — K219 Gastro-esophageal reflux disease without esophagitis: Secondary | ICD-10-CM | POA: Diagnosis not present

## 2013-02-11 DIAGNOSIS — Z8679 Personal history of other diseases of the circulatory system: Secondary | ICD-10-CM | POA: Insufficient documentation

## 2013-02-11 DIAGNOSIS — R61 Generalized hyperhidrosis: Secondary | ICD-10-CM | POA: Diagnosis not present

## 2013-02-11 DIAGNOSIS — Z87891 Personal history of nicotine dependence: Secondary | ICD-10-CM | POA: Diagnosis not present

## 2013-02-11 DIAGNOSIS — M25522 Pain in left elbow: Secondary | ICD-10-CM

## 2013-02-11 DIAGNOSIS — Z872 Personal history of diseases of the skin and subcutaneous tissue: Secondary | ICD-10-CM | POA: Diagnosis not present

## 2013-02-11 DIAGNOSIS — Z79899 Other long term (current) drug therapy: Secondary | ICD-10-CM | POA: Insufficient documentation

## 2013-02-11 DIAGNOSIS — Z7901 Long term (current) use of anticoagulants: Secondary | ICD-10-CM | POA: Diagnosis not present

## 2013-02-11 DIAGNOSIS — I2510 Atherosclerotic heart disease of native coronary artery without angina pectoris: Secondary | ICD-10-CM | POA: Diagnosis not present

## 2013-02-11 DIAGNOSIS — Z8669 Personal history of other diseases of the nervous system and sense organs: Secondary | ICD-10-CM | POA: Diagnosis not present

## 2013-02-11 DIAGNOSIS — M25529 Pain in unspecified elbow: Secondary | ICD-10-CM | POA: Diagnosis not present

## 2013-02-11 DIAGNOSIS — Z8739 Personal history of other diseases of the musculoskeletal system and connective tissue: Secondary | ICD-10-CM | POA: Diagnosis not present

## 2013-02-11 DIAGNOSIS — I4891 Unspecified atrial fibrillation: Secondary | ICD-10-CM | POA: Diagnosis not present

## 2013-02-11 DIAGNOSIS — E785 Hyperlipidemia, unspecified: Secondary | ICD-10-CM | POA: Insufficient documentation

## 2013-02-11 DIAGNOSIS — R079 Chest pain, unspecified: Secondary | ICD-10-CM | POA: Diagnosis not present

## 2013-02-11 LAB — BASIC METABOLIC PANEL
BUN: 31 mg/dL — ABNORMAL HIGH (ref 6–23)
CO2: 28 mEq/L (ref 19–32)
Calcium: 9.9 mg/dL (ref 8.4–10.5)
Chloride: 103 mEq/L (ref 96–112)
Creatinine, Ser: 0.81 mg/dL (ref 0.50–1.10)
GFR calc Af Amer: 80 mL/min — ABNORMAL LOW (ref >90)
GFR calc non Af Amer: 69 mL/min — ABNORMAL LOW (ref >90)
Glucose, Bld: 92 mg/dL (ref 70–99)
Potassium: 4 mEq/L (ref 3.5–5.1)
Sodium: 139 mEq/L (ref 135–145)

## 2013-02-11 LAB — PROTIME-INR
INR: 1.97 — ABNORMAL HIGH (ref 0.00–1.49)
Prothrombin Time: 21.8 seconds — ABNORMAL HIGH (ref 11.6–15.2)

## 2013-02-11 LAB — TROPONIN I
Troponin I: <0.3 ng/mL (ref <0.30)
Troponin I: <0.3 ng/mL (ref <0.30)

## 2013-02-11 LAB — CBC
HCT: 39.7 % (ref 36.0–46.0)
Hemoglobin: 13.2 g/dL (ref 12.0–15.0)
MCH: 29 pg (ref 26.0–34.0)
MCHC: 33.2 g/dL (ref 30.0–36.0)
MCV: 87.3 fL (ref 78.0–100.0)
Platelets: 152 10*3/uL (ref 150–400)
RBC: 4.55 MIL/uL (ref 3.87–5.11)
RDW: 14.2 % (ref 11.5–15.5)
WBC: 6.8 10*3/uL (ref 4.0–10.5)

## 2013-02-11 MED ORDER — ASPIRIN 81 MG PO CHEW
324.0000 mg | CHEWABLE_TABLET | Freq: Once | ORAL | Status: AC
  Administered 2013-02-11: 324 mg via ORAL
  Filled 2013-02-11: qty 4

## 2013-02-11 NOTE — ED Notes (Signed)
Pt reports waking up at 0400 with a discomfort to left elbow, pain free on arrival to ed. Pt reports pain similar to MI in 2006. ekg done at triage.

## 2013-02-11 NOTE — ED Provider Notes (Signed)
History     78 year old female with left elbow pain. Onset was shortly after 4:00 this morning while she was laying in bed. Patient describes an ache" deep in the elbow." Associated with diaphoresis. Episode lasted approximately 30-35 minutes and gradually subsided. No repeat episodes since then. No SOB. No cough, fever or chills. No unusual leg pain or swelling. Pt reports history of arthritis and qeustions if her elbow pain may have been this. Patient with a known history of coronary artery disease a/p bare metal stent in 2006. Cardiologist, Dr Myrtis Ser. Reports compliance with medications.   CSN: [id number] Arrival date & time 02/11/13  0725  First MD Initiated Contact with Patient 02/11/13 [phone]     Chief Complaint  Patient presents with  . Chest Pain   (Consider location/radiation/quality/duration/timing/severity/associated sxs/prior Treatment) HPI Past Medical History  Diagnosis Date  . Hypertension   . Dyslipidemia   . Atrial fibrillation   . CAD (coronary artery disease)     Bare-metal stent December, 2006 /  Nuclear, October 11, 2010, question of small scar or ischemia in the inferolateral wall versus attenuation  . Degenerative arthritis of right knee   . Morbid obesity   . Psoriasis   . Carotid bruit     doppler, 2012, no significant abnormality  . Ejection fraction     EF 55-60%, echo, August, 2012  . Mitral regurgitation     Mild, echo, August, 2012  . Right ventricular dysfunction     Mild dilatation of the right ventricle, mild decreased right ventricular function, echo, August, 2012  . Tricuspid regurgitation     Severe, echo, August of 2012  . Pulmonary hypertension     moderate RHC  . Warfarin anticoagulation   . Partial, of this data file.   [name] Shortness of breath   . GERD (gastroesophageal reflux disease)   . Neuromuscular disorder     rt carpal tunnel   Past Surgical History  Procedure Laterality Date  . Cholecystectomy    . Right ankle surgery    .  Cardiac catheterization    . Coronary angioplasty  2006    stent  . Colonoscopy    . Carpal tunnel release  11/29/2011    Procedure: CARPAL TUNNEL RELEASE;  Surgeon: Wyn Forster., MD;  Location: [facility];  Service: Orthopedics;  Laterality: Right;   Family History  Problem Relation Age of Onset  . Heart attack Mother   . Coronary artery disease Father   . Stroke Father   . Heart attack Brother   . Heart failure Brother   . COPD Brother   . Emphysema Sister     smoked  . Emphysema Brother     smoked   History  Substance Use Topics  . Smoking status: Former Smoker -- 0.30 packs/day for .5 years    Types: Cigarettes    Quit date: 08/19/1986  . Smokeless tobacco: Never Used     Comment: pt only smoked for only a few months.  . Alcohol Use: No   OB History   Grav Para Term Preterm Abortions TAB SAB Ect Mult Living                 Review of Systems  All systems reviewed and negative, other than as noted in HPI.   Allergies  Review of patient's allergies indicates no known allergies.  Home Medications   Current Outpatient Rx  Name  Route  Sig  Dispense  Refill  .  albuterol (PROVENTIL) 2 MG tablet   Oral   Take 2 mg by mouth 3 (three) times daily as needed.          . ALPRAZolam (XANAX) 0.5 MG tablet   Oral   Take 0.75 mg by mouth at bedtime as needed.           [name] aspirin 81 MG tablet   Oral   Take 81 mg by mouth daily.           [name] atorvastatin (LIPITOR) 80 MG tablet   Oral   Take 0.5 tablets (40 mg total) by mouth daily.   45 tablet   2   . AZOPT 1 % ophthalmic suspension      as directed.          . Biotin 1000 MCG tablet   Oral   Take 1,000 mcg by mouth daily.           . Cholecalciferol (VITAMIN D3) 1000 UNITS CAPS   Oral   Take 1 tablet by mouth daily.           . Coenzyme Q10 (COQ10) 50 MG CAPS   Oral   Take 2 tablets by mouth daily.           . famotidine (PEPCID) 20 MG tablet   Oral   Take 20 mg by  mouth as needed.           . fexofenadine (ALLEGRA) 180 MG tablet   Oral   Take 180 mg by mouth as needed.           . furosemide (LASIX) 40 MG tablet      Take two tabs daily         . HYDROcodone-acetaminophen (VICODIN) 5-500 MG per tablet   Oral   Take 0.5 tablets by mouth every 4 (four) hours as needed.           . latanoprost (XALATAN) 0.005 % ophthalmic solution      as directed.          . mometasone (NASONEX) 50 MCG/ACT nasal spray   Nasal   Place 2 sprays into the nose daily as needed.          . Multiple Vitamin (MULTIVITAMIN) capsule   Oral   Take 1 capsule by mouth daily.           . nitroGLYCERIN (NITROSTAT) 0.4 MG SL tablet   Sublingual   Place 1 tablet (0.4 mg total) under the tongue every 5 (five) minutes as needed.   100 tablet   3   . NON FORMULARY      OXygen at night 2L         . omeprazole (PRILOSEC) 20 MG capsule   Oral   Take 40 mg by mouth daily.           . potassium chloride SA (K-DUR,KLOR-CON) 20 MEQ tablet   Oral   Take 20 mEq by mouth daily as needed.          . valsartan (DIOVAN) 320 MG tablet   Oral   Take 1 tablet (320 mg total) by mouth daily.   30 tablet   6   . warfarin (COUMADIN) 5 MG tablet      TAKE 1 TABLET BY MOUTH AS DIRECTED   45 tablet   6     Generic ZOX:[id number]   5MG  Generic JWJ:[id number]    ...    BP 158/65  Pulse  57  Temp(Src) 98.1 F (36.7 C) (Oral)  SpO2 96% Physical Exam  Nursing note and vitals reviewed. Constitutional: She appears well-developed and well-nourished. No distress.  Laying in bed. NAD. Obese.   HENT:  Head: Normocephalic and atraumatic.  Eyes: Conjunctivae are normal. Right eye exhibits no discharge. Left eye exhibits no discharge.  Neck: Neck supple.  Cardiovascular: Normal rate, regular rhythm and normal heart sounds.  Exam reveals no gallop and no friction rub.   No murmur heard. Pulmonary/Chest: Effort normal and breath sounds normal. No respiratory  distress.  Abdominal: Soft. She exhibits no distension. There is no tenderness.  Musculoskeletal: She exhibits no edema and no tenderness.  Neurological: She is alert.  Skin: Skin is warm and dry. She is not diaphoretic.  Psychiatric: She has a normal mood and affect. Her behavior is normal. Thought content normal.    ED Course  Procedures (including critical care time) Labs Reviewed  BASIC METABOLIC PANEL - Abnormal; Notable for the following:    BUN 31 (*)    GFR calc non Af Amer 69 (*)    GFR calc Af Amer 80 (*)    All other components within normal limits  PROTIME-INR - Abnormal; Notable for the following:    Prothrombin Time 21.8 (*)    INR 1.97 (*)    All other components within normal limits  CBC  TROPONIN I  TROPONIN I   Dg Chest 2 View  02/11/2013   *RADIOLOGY REPORT*  Clinical Data: Chest pain, left arm pain  CHEST - 2 VIEW  Comparison: 04/24/2011; 03/07/2011  Findings:  Grossly unchanged enlarged cardiac silhouette and prominence of the central pulmonary vasculature. Evaluation of retrosternal clear space is obscured secondary to overlying soft tissues.  No focal airspace opacity.  No pleural effusion or pneumothorax.  No definite evidence of edema.  Mild scoliotic curvature of the thoracic spine.  IMPRESSION:  1.  No acute cardiopulmonary disease. 2.  Unchanged enlarged cardiac silhouette and prominence of central pulmonary vasculature, nonspecific but could be seen in the setting of pulmonary arterial hypertension.  Further evaluation with cardiac echo may be performed as clinically indicated.   Original Report Authenticated By: Tacey Ruiz, MD  EKG:  Rhythm:  Atrial fibrillation Vent. rate 57 BPM QRS duration 92 ms QT/QTc 426/414 ms ST segments: NS ST changes Comparison: stable from 08/2012  No results found. 1. Elbow pain, left     MDM  77yF with L elbow pain associated with diaphoresis. Pt questions arthritic pain and hot flash. Potentially, but concerning  particularly with hx of known CAD. EKG with rate controlled afib. Similar to priors. Plan ASA, basics labs/trop, CXR.  Initial trop and repeat normal. Second drawn over 7 hours after onset of symptoms. No recurrent symptoms. Has established cardiologist. I feel safe for DC at this time for close outpt FU.   Raeford Razor, MD 02/13/13 [phone]

## 2013-02-17 DIAGNOSIS — I4891 Unspecified atrial fibrillation: Secondary | ICD-10-CM | POA: Diagnosis not present

## 2013-02-17 DIAGNOSIS — F411 Generalized anxiety disorder: Secondary | ICD-10-CM | POA: Diagnosis not present

## 2013-02-17 DIAGNOSIS — Z6841 Body Mass Index (BMI) 40.0 and over, adult: Secondary | ICD-10-CM | POA: Diagnosis not present

## 2013-02-17 DIAGNOSIS — I10 Essential (primary) hypertension: Secondary | ICD-10-CM | POA: Diagnosis not present

## 2013-02-17 DIAGNOSIS — J019 Acute sinusitis, unspecified: Secondary | ICD-10-CM | POA: Diagnosis not present

## 2013-02-22 ENCOUNTER — Ambulatory Visit (INDEPENDENT_AMBULATORY_CARE_PROVIDER_SITE_OTHER): Payer: Medicare Other | Admitting: *Deleted

## 2013-02-22 DIAGNOSIS — Z7901 Long term (current) use of anticoagulants: Secondary | ICD-10-CM | POA: Diagnosis not present

## 2013-02-22 DIAGNOSIS — I4891 Unspecified atrial fibrillation: Secondary | ICD-10-CM | POA: Diagnosis not present

## 2013-02-22 DIAGNOSIS — Z8679 Personal history of other diseases of the circulatory system: Secondary | ICD-10-CM | POA: Diagnosis not present

## 2013-02-22 LAB — POCT INR: INR: 2.3

## 2013-03-02 DIAGNOSIS — M999 Biomechanical lesion, unspecified: Secondary | ICD-10-CM | POA: Diagnosis not present

## 2013-03-02 DIAGNOSIS — M47817 Spondylosis without myelopathy or radiculopathy, lumbosacral region: Secondary | ICD-10-CM | POA: Diagnosis not present

## 2013-03-02 DIAGNOSIS — M5430 Sciatica, unspecified side: Secondary | ICD-10-CM | POA: Diagnosis not present

## 2013-04-15 ENCOUNTER — Ambulatory Visit (INDEPENDENT_AMBULATORY_CARE_PROVIDER_SITE_OTHER): Payer: Medicare Other | Admitting: *Deleted

## 2013-04-15 DIAGNOSIS — Z7901 Long term (current) use of anticoagulants: Secondary | ICD-10-CM

## 2013-04-15 DIAGNOSIS — I4891 Unspecified atrial fibrillation: Secondary | ICD-10-CM | POA: Diagnosis not present

## 2013-04-15 DIAGNOSIS — Z8679 Personal history of other diseases of the circulatory system: Secondary | ICD-10-CM

## 2013-04-15 LAB — POCT INR: INR: 2.6

## 2013-05-01 ENCOUNTER — Other Ambulatory Visit: Payer: Self-pay | Admitting: Cardiology

## 2013-05-14 ENCOUNTER — Other Ambulatory Visit: Payer: Self-pay | Admitting: Cardiology

## 2013-05-27 ENCOUNTER — Ambulatory Visit (INDEPENDENT_AMBULATORY_CARE_PROVIDER_SITE_OTHER): Payer: Medicare Other | Admitting: *Deleted

## 2013-05-27 DIAGNOSIS — I4891 Unspecified atrial fibrillation: Secondary | ICD-10-CM

## 2013-05-27 DIAGNOSIS — Z8679 Personal history of other diseases of the circulatory system: Secondary | ICD-10-CM | POA: Diagnosis not present

## 2013-05-27 DIAGNOSIS — Z7901 Long term (current) use of anticoagulants: Secondary | ICD-10-CM | POA: Diagnosis not present

## 2013-05-27 LAB — POCT INR: INR: 3.1

## 2013-06-02 DIAGNOSIS — Z6841 Body Mass Index (BMI) 40.0 and over, adult: Secondary | ICD-10-CM | POA: Diagnosis not present

## 2013-06-02 DIAGNOSIS — G8929 Other chronic pain: Secondary | ICD-10-CM | POA: Diagnosis not present

## 2013-06-02 DIAGNOSIS — F411 Generalized anxiety disorder: Secondary | ICD-10-CM | POA: Diagnosis not present

## 2013-06-28 DIAGNOSIS — J449 Chronic obstructive pulmonary disease, unspecified: Secondary | ICD-10-CM | POA: Diagnosis not present

## 2013-06-28 DIAGNOSIS — J042 Acute laryngotracheitis: Secondary | ICD-10-CM | POA: Diagnosis not present

## 2013-06-28 DIAGNOSIS — J209 Acute bronchitis, unspecified: Secondary | ICD-10-CM | POA: Diagnosis not present

## 2013-06-28 DIAGNOSIS — Z6841 Body Mass Index (BMI) 40.0 and over, adult: Secondary | ICD-10-CM | POA: Diagnosis not present

## 2013-06-28 DIAGNOSIS — J0100 Acute maxillary sinusitis, unspecified: Secondary | ICD-10-CM | POA: Diagnosis not present

## 2013-06-28 DIAGNOSIS — K219 Gastro-esophageal reflux disease without esophagitis: Secondary | ICD-10-CM | POA: Diagnosis not present

## 2013-07-04 ENCOUNTER — Other Ambulatory Visit: Payer: Self-pay | Admitting: Cardiology

## 2013-07-05 ENCOUNTER — Other Ambulatory Visit: Payer: Self-pay | Admitting: *Deleted

## 2013-07-05 ENCOUNTER — Ambulatory Visit: Payer: Medicare Other | Admitting: Pulmonary Disease

## 2013-07-12 DIAGNOSIS — J449 Chronic obstructive pulmonary disease, unspecified: Secondary | ICD-10-CM | POA: Diagnosis not present

## 2013-07-12 DIAGNOSIS — J042 Acute laryngotracheitis: Secondary | ICD-10-CM | POA: Diagnosis not present

## 2013-07-12 DIAGNOSIS — J0100 Acute maxillary sinusitis, unspecified: Secondary | ICD-10-CM | POA: Diagnosis not present

## 2013-07-12 DIAGNOSIS — Z6841 Body Mass Index (BMI) 40.0 and over, adult: Secondary | ICD-10-CM | POA: Diagnosis not present

## 2013-07-14 ENCOUNTER — Ambulatory Visit (INDEPENDENT_AMBULATORY_CARE_PROVIDER_SITE_OTHER): Payer: Medicare Other | Admitting: *Deleted

## 2013-07-14 DIAGNOSIS — Z7901 Long term (current) use of anticoagulants: Secondary | ICD-10-CM

## 2013-07-14 DIAGNOSIS — I4891 Unspecified atrial fibrillation: Secondary | ICD-10-CM

## 2013-07-14 DIAGNOSIS — Z8679 Personal history of other diseases of the circulatory system: Secondary | ICD-10-CM

## 2013-07-14 LAB — POCT INR: INR: 3.2

## 2013-08-04 ENCOUNTER — Ambulatory Visit (INDEPENDENT_AMBULATORY_CARE_PROVIDER_SITE_OTHER): Payer: Medicare Other | Admitting: *Deleted

## 2013-08-04 DIAGNOSIS — I4891 Unspecified atrial fibrillation: Secondary | ICD-10-CM

## 2013-08-04 DIAGNOSIS — Z7901 Long term (current) use of anticoagulants: Secondary | ICD-10-CM | POA: Diagnosis not present

## 2013-08-04 DIAGNOSIS — Z8679 Personal history of other diseases of the circulatory system: Secondary | ICD-10-CM

## 2013-08-04 LAB — POCT INR: INR: 3.7

## 2013-08-06 DIAGNOSIS — Z23 Encounter for immunization: Secondary | ICD-10-CM | POA: Diagnosis not present

## 2013-08-10 ENCOUNTER — Other Ambulatory Visit: Payer: Self-pay | Admitting: Cardiology

## 2013-08-18 DIAGNOSIS — Z23 Encounter for immunization: Secondary | ICD-10-CM | POA: Diagnosis not present

## 2013-08-23 ENCOUNTER — Other Ambulatory Visit: Payer: Self-pay

## 2013-08-25 ENCOUNTER — Telehealth: Payer: Self-pay

## 2013-08-25 ENCOUNTER — Ambulatory Visit (INDEPENDENT_AMBULATORY_CARE_PROVIDER_SITE_OTHER): Payer: Medicare Other | Admitting: *Deleted

## 2013-08-25 DIAGNOSIS — Z8679 Personal history of other diseases of the circulatory system: Secondary | ICD-10-CM

## 2013-08-25 DIAGNOSIS — I4891 Unspecified atrial fibrillation: Secondary | ICD-10-CM | POA: Diagnosis not present

## 2013-08-25 DIAGNOSIS — Z7901 Long term (current) use of anticoagulants: Secondary | ICD-10-CM

## 2013-08-25 LAB — POCT INR: INR: 2.2

## 2013-08-26 ENCOUNTER — Telehealth: Payer: Self-pay | Admitting: *Deleted

## 2013-08-26 NOTE — Telephone Encounter (Signed)
[facility] called requesting to a new rx for losartan. She stated that it was supposed to be changed to this from diovan. I don't see any note of this in the chart. Do you know anything about this? Thanks, MI

## 2013-08-26 NOTE — Telephone Encounter (Signed)
Dr Ron Parker, pharmacy is asking for a change from diovan to losartan probably a  formulary change for this year, can we do this?

## 2013-08-27 ENCOUNTER — Other Ambulatory Visit: Payer: Self-pay

## 2013-08-27 MED ORDER — LOSARTAN POTASSIUM 100 MG PO TABS: 100.0000 mg | ORAL_TABLET | Freq: Every day | ORAL | Status: DC

## 2013-08-27 NOTE — Telephone Encounter (Signed)
Per Dr Ron Parker the pt is advised that her Diovan is being replaced with Losartan 100 mg daily due to pts insurance not covering Diovan at same cost any longer. She verbalized understanding. RX sent to [facility] to fill.

## 2013-08-27 NOTE — Telephone Encounter (Signed)
Please take care of this. I think we did this earlier in the week

## 2013-08-30 DIAGNOSIS — Z6841 Body Mass Index (BMI) 40.0 and over, adult: Secondary | ICD-10-CM | POA: Diagnosis not present

## 2013-08-30 DIAGNOSIS — I10 Essential (primary) hypertension: Secondary | ICD-10-CM | POA: Diagnosis not present

## 2013-08-30 DIAGNOSIS — I4891 Unspecified atrial fibrillation: Secondary | ICD-10-CM | POA: Diagnosis not present

## 2013-08-30 DIAGNOSIS — K219 Gastro-esophageal reflux disease without esophagitis: Secondary | ICD-10-CM | POA: Diagnosis not present

## 2013-09-16 NOTE — Telephone Encounter (Signed)
error 

## 2013-09-20 ENCOUNTER — Encounter: Payer: Self-pay | Admitting: Cardiology

## 2013-09-20 ENCOUNTER — Ambulatory Visit (INDEPENDENT_AMBULATORY_CARE_PROVIDER_SITE_OTHER): Payer: Medicare Other | Admitting: Cardiology

## 2013-09-20 VITALS — BP 140/66 | HR 56 | Ht 62.0 in | Wt 244.0 lb

## 2013-09-20 DIAGNOSIS — Z7901 Long term (current) use of anticoagulants: Secondary | ICD-10-CM

## 2013-09-20 DIAGNOSIS — I272 Pulmonary hypertension, unspecified: Secondary | ICD-10-CM

## 2013-09-20 DIAGNOSIS — E785 Hyperlipidemia, unspecified: Secondary | ICD-10-CM | POA: Diagnosis not present

## 2013-09-20 DIAGNOSIS — I2510 Atherosclerotic heart disease of native coronary artery without angina pectoris: Secondary | ICD-10-CM

## 2013-09-20 DIAGNOSIS — I10 Essential (primary) hypertension: Secondary | ICD-10-CM

## 2013-09-20 DIAGNOSIS — I2789 Other specified pulmonary heart diseases: Secondary | ICD-10-CM | POA: Diagnosis not present

## 2013-09-20 DIAGNOSIS — I4891 Unspecified atrial fibrillation: Secondary | ICD-10-CM

## 2013-09-20 NOTE — Assessment & Plan Note (Signed)
The patient has coronary disease. She had a stent in the past. She's not having any significant symptoms. I have carefully considered whether her aspirin should be continued along with her Coumadin. She is a low bleeding risk as she has tolerated both of these medications. Therefore I've chosen not to stop her aspirin at this time.

## 2013-09-20 NOTE — Patient Instructions (Signed)
Your physician wants you to follow-up in:  12 months.  You will receive a reminder letter in the mail two months in advance. If you don't receive a letter, please call our office to schedule the follow-up appointment.   

## 2013-09-20 NOTE — Assessment & Plan Note (Signed)
Atrial fib rate is on the slow side. She is on no AV nodal blocking agents. She is not symptomatic. We will continue to follow her.

## 2013-09-20 NOTE — Assessment & Plan Note (Signed)
The patient is on guideline directed medical therapy for her dyslipidemia with high dose atorvastatin.

## 2013-09-20 NOTE — Progress Notes (Signed)
HPI  Patient is seen today to followup a history of atrial fibrillation. She has significant pulmonary hypertension. She is coumadinized. She has had a right heart catheterization. It is felt that her pulmonary hypertension is multifactorial. She is followed by Dr. Gwenette Greet carefully for this.  She's not having any chest pain. She's had no syncope or presyncope. She tells me that she exercises regularly.  No Known Allergies  Current Outpatient Prescriptions  Medication Sig Dispense Refill  . albuterol (PROVENTIL) 2 MG tablet Take 2 mg by mouth 3 (three) times daily as needed.       . ALPRAZolam (XANAX) 0.5 MG tablet Take 0.75 mg by mouth at bedtime as needed.        [name] aspirin 81 MG tablet Take 81 mg by mouth daily.        [name] atorvastatin (LIPITOR) 80 MG tablet TAKE 1/2 TABLET BY MOUTH DAILY  45 tablet  0  . AZOPT 1 % ophthalmic suspension as directed.       . Biotin 1000 MCG tablet Take 1,000 mcg by mouth daily.        [name] CARAFATE 1 GM/10ML suspension Take 1 g by mouth 2 (two) times daily.       . Cholecalciferol (VITAMIN D3) 1000 UNITS CAPS Take 1 tablet by mouth daily.        . Coenzyme Q10 (COQ10) 50 MG CAPS Take 2 tablets by mouth daily.        . famotidine (PEPCID) 20 MG tablet Take 20 mg by mouth as needed.        . fexofenadine (ALLEGRA) 180 MG tablet Take 180 mg by mouth as needed.        . furosemide (LASIX) 40 MG tablet Take two tabs daily      . HYDROcodone-acetaminophen (VICODIN) 5-500 MG per tablet Take 0.5 tablets by mouth every 4 (four) hours as needed.        . latanoprost (XALATAN) 0.005 % ophthalmic solution as directed.       [name] losartan (COZAAR) 100 MG tablet Take 1 tablet (100 mg total) by mouth daily.  30 tablet  3  . mometasone (NASONEX) 50 MCG/ACT nasal spray Place 2 sprays into the nose daily as needed.       . Multiple Vitamin (MULTIVITAMIN) capsule Take 1 capsule by mouth daily.        . nitroGLYCERIN (NITROSTAT) 0.4 MG SL tablet Place 1 tablet (0.4 mg total) under  the tongue every 5 (five) minutes as needed.  100 tablet  3  . NON FORMULARY OXygen at night 2L      . omeprazole (PRILOSEC) 20 MG capsule Take 40 mg by mouth daily.        . potassium chloride SA (K-DUR,KLOR-CON) 20 MEQ tablet Take 20 mEq by mouth daily as needed.       . warfarin (COUMADIN) 5 MG tablet Take 5 mg by mouth daily.       No current facility-administered medications for this visit.    History   Social History  . Marital Status: Married    Spouse Name: N/A    Number of Children: 2  . Years of Education: N/A   Occupational History  . retired    Social History Main Topics  . Smoking status: Former Smoker -- 0.30 packs/day for .5 years    Types: Cigarettes    Quit date: 08/19/1986  . Smokeless tobacco: Never Used     Comment: pt only  smoked for only a few months.  . Alcohol Use: No  . Drug Use: No  . Sexual Activity: Not on file   Other Topics Concern  . Not on file   Social History Narrative  . No narrative on file    Family History  Problem Relation Age of Onset  . Heart attack Mother   . Coronary artery disease Father   . Stroke Father   . Heart attack Brother   . Heart failure Brother   . COPD Brother   . Emphysema Sister     smoked  . Emphysema Brother     smoked    Past Medical History  Diagnosis Date  . Hypertension   . Dyslipidemia   . Atrial fibrillation   . CAD (coronary artery disease)     Bare-metal stent December, 2006 /  Nuclear, October 11, 2010, question of small scar or ischemia in the inferolateral wall versus attenuation  . Degenerative arthritis of right knee   . Morbid obesity   . Psoriasis   . Carotid bruit     doppler, 2012, no significant abnormality  . Ejection fraction     EF 55-60%, echo, August, 2012  . Mitral regurgitation     Mild, echo, August, 2012  . Right ventricular dysfunction     Mild dilatation of the right ventricle, mild decreased right ventricular function, echo, August, 2012  . Tricuspid  regurgitation     Severe, echo, August of 2012  . Pulmonary hypertension     moderate RHC  . Warfarin anticoagulation   . Partial, of this data file.   [name] Shortness of breath   . GERD (gastroesophageal reflux disease)   . Neuromuscular disorder     rt carpal tunnel    Past Surgical History  Procedure Laterality Date  . Cholecystectomy    . Right ankle surgery    . Cardiac catheterization    . Coronary angioplasty  2006    stent  . Colonoscopy    . Carpal tunnel release  11/29/2011    Procedure: CARPAL TUNNEL RELEASE;  Surgeon: Cammie Sickle., MD;  Location: [facility];  Service: Orthopedics;  Laterality: Right;    Patient Active Problem List   Diagnosis Date Noted  . Carotid bruit     Priority: High  . Warfarin anticoagulation     Priority: High  . Pulmonary hypertension 04/19/2011    Priority: High  . DOE (dyspnea on exertion) 05/26/2012  . Psoriasis   . CAD (coronary artery disease)   . Atrial fibrillation 11/16/2010  . Encounter for long-term (current) use of anticoagulants 11/16/2010  . GERD 04/17/2010  . COLONIC POLYPS, HX OF 04/17/2010  . DYSLIPIDEMIA 04/17/2009  . Overweight 04/17/2009  . HYPERTENSION 04/17/2009    ROS   Patient denies fever, chills, headache, sweats, rash, change in vision, change in hearing, chest pain, cough, nausea vomiting, urinary symptoms. All other systems are reviewed and are negative.  PHYSICAL EXAM  Patient is significantly overweight area she is oriented to person time and place. Affect is normal. There is no jugulovenous distention. Lungs are clear. Respiratory effort is nonlabored. Cardiac exam reveals S1 and S2. The rhythm is irregularly irregular. The abdomen is protuberant. She has 1+ peripheral edema.  Filed Vitals:   09/20/13 1619  BP: 140/66  Pulse: 56  Height: 5\' 2"  (1.575 m)  Weight: 244 lb (110.678 kg)   EKG is done today and reviewed by me. There is atrial fibrillation.  The rate is relatively  slow. There is no change in her QRS.  ASSESSMENT & PLAN

## 2013-09-20 NOTE — Assessment & Plan Note (Signed)
Pulmonary hypertension is followed by Dr. Gwenette Greet. I encouraged the patient to make an appointment to see him soon.

## 2013-09-20 NOTE — Assessment & Plan Note (Signed)
She continues on Coumadin for her atrial fibrillation.

## 2013-09-20 NOTE — Assessment & Plan Note (Signed)
Blood pressures control.

## 2013-09-22 ENCOUNTER — Ambulatory Visit (INDEPENDENT_AMBULATORY_CARE_PROVIDER_SITE_OTHER): Payer: Medicare Other | Admitting: *Deleted

## 2013-09-22 DIAGNOSIS — I4891 Unspecified atrial fibrillation: Secondary | ICD-10-CM

## 2013-09-22 DIAGNOSIS — Z5181 Encounter for therapeutic drug level monitoring: Secondary | ICD-10-CM | POA: Diagnosis not present

## 2013-09-22 DIAGNOSIS — Z7901 Long term (current) use of anticoagulants: Secondary | ICD-10-CM

## 2013-09-22 DIAGNOSIS — Z8679 Personal history of other diseases of the circulatory system: Secondary | ICD-10-CM | POA: Diagnosis not present

## 2013-09-22 LAB — POCT INR: INR: 2.3

## 2013-10-01 DIAGNOSIS — Z79899 Other long term (current) drug therapy: Secondary | ICD-10-CM | POA: Diagnosis not present

## 2013-10-01 DIAGNOSIS — K219 Gastro-esophageal reflux disease without esophagitis: Secondary | ICD-10-CM | POA: Diagnosis not present

## 2013-10-01 DIAGNOSIS — Z0000 Encounter for general adult medical examination without abnormal findings: Secondary | ICD-10-CM | POA: Diagnosis not present

## 2013-10-01 DIAGNOSIS — M159 Polyosteoarthritis, unspecified: Secondary | ICD-10-CM | POA: Diagnosis not present

## 2013-10-01 DIAGNOSIS — Z6841 Body Mass Index (BMI) 40.0 and over, adult: Secondary | ICD-10-CM | POA: Diagnosis not present

## 2013-10-05 ENCOUNTER — Encounter (HOSPITAL_COMMUNITY): Payer: Self-pay | Admitting: Emergency Medicine

## 2013-10-05 ENCOUNTER — Emergency Department (HOSPITAL_COMMUNITY): Payer: Medicare Other

## 2013-10-05 ENCOUNTER — Emergency Department (HOSPITAL_COMMUNITY)
Admission: EM | Admit: 2013-10-05 | Discharge: 2013-10-05 | Disposition: A | Discharge status: Home / Self Care | Payer: Medicare Other | Attending: Emergency Medicine | Admitting: Emergency Medicine

## 2013-10-05 DIAGNOSIS — I2510 Atherosclerotic heart disease of native coronary artery without angina pectoris: Secondary | ICD-10-CM | POA: Diagnosis not present

## 2013-10-05 DIAGNOSIS — R911 Solitary pulmonary nodule: Secondary | ICD-10-CM

## 2013-10-05 DIAGNOSIS — E785 Hyperlipidemia, unspecified: Secondary | ICD-10-CM | POA: Diagnosis not present

## 2013-10-05 DIAGNOSIS — J449 Chronic obstructive pulmonary disease, unspecified: Secondary | ICD-10-CM | POA: Diagnosis not present

## 2013-10-05 DIAGNOSIS — Z8669 Personal history of other diseases of the nervous system and sense organs: Secondary | ICD-10-CM | POA: Diagnosis not present

## 2013-10-05 DIAGNOSIS — IMO0002 Reserved for concepts with insufficient information to code with codable children: Secondary | ICD-10-CM | POA: Insufficient documentation

## 2013-10-05 DIAGNOSIS — I10 Essential (primary) hypertension: Secondary | ICD-10-CM | POA: Diagnosis not present

## 2013-10-05 DIAGNOSIS — R109 Unspecified abdominal pain: Secondary | ICD-10-CM | POA: Diagnosis not present

## 2013-10-05 DIAGNOSIS — K219 Gastro-esophageal reflux disease without esophagitis: Secondary | ICD-10-CM | POA: Diagnosis not present

## 2013-10-05 DIAGNOSIS — Z9889 Other specified postprocedural states: Secondary | ICD-10-CM | POA: Diagnosis not present

## 2013-10-05 DIAGNOSIS — Z9861 Coronary angioplasty status: Secondary | ICD-10-CM | POA: Diagnosis not present

## 2013-10-05 DIAGNOSIS — Z7901 Long term (current) use of anticoagulants: Secondary | ICD-10-CM | POA: Diagnosis not present

## 2013-10-05 DIAGNOSIS — Z87891 Personal history of nicotine dependence: Secondary | ICD-10-CM | POA: Insufficient documentation

## 2013-10-05 DIAGNOSIS — M1710 Unilateral primary osteoarthritis, unspecified knee: Secondary | ICD-10-CM | POA: Insufficient documentation

## 2013-10-05 DIAGNOSIS — R0789 Other chest pain: Secondary | ICD-10-CM | POA: Diagnosis not present

## 2013-10-05 DIAGNOSIS — I4891 Unspecified atrial fibrillation: Secondary | ICD-10-CM | POA: Insufficient documentation

## 2013-10-05 DIAGNOSIS — Z872 Personal history of diseases of the skin and subcutaneous tissue: Secondary | ICD-10-CM | POA: Diagnosis not present

## 2013-10-05 DIAGNOSIS — Z7982 Long term (current) use of aspirin: Secondary | ICD-10-CM | POA: Insufficient documentation

## 2013-10-05 DIAGNOSIS — Z79899 Other long term (current) drug therapy: Secondary | ICD-10-CM | POA: Insufficient documentation

## 2013-10-05 LAB — COMPREHENSIVE METABOLIC PANEL
ALBUMIN: 3.3 g/dL — AB (ref 3.5–5.2)
ALK PHOS: 74 U/L (ref 39–117)
ALT: 14 U/L (ref 0–35)
AST: 19 U/L (ref 0–37)
BUN: 23 mg/dL (ref 6–23)
CO2: 27 mEq/L (ref 19–32)
Calcium: 9.2 mg/dL (ref 8.4–10.5)
Chloride: 107 mEq/L (ref 96–112)
Creatinine, Ser: 0.83 mg/dL (ref 0.50–1.10)
GFR calc Af Amer: 77 mL/min — ABNORMAL LOW (ref >90)
GFR calc non Af Amer: 67 mL/min — ABNORMAL LOW (ref >90)
Glucose, Bld: 107 mg/dL — ABNORMAL HIGH (ref 70–99)
POTASSIUM: 4 meq/L (ref 3.7–5.3)
SODIUM: 145 meq/L (ref 137–147)
Total Bilirubin: 0.3 mg/dL (ref 0.3–1.2)
Total Protein: 6.4 g/dL (ref 6.0–8.3)

## 2013-10-05 LAB — CBC WITH DIFFERENTIAL/PLATELET
BASOS PCT: 0 % (ref 0–1)
Basophils Absolute: 0 10*3/uL (ref 0.0–0.1)
Eosinophils Absolute: 0.1 10*3/uL (ref 0.0–0.7)
Eosinophils Relative: 1 % (ref 0–5)
HCT: 34.8 % — ABNORMAL LOW (ref 36.0–46.0)
Hemoglobin: 11.3 g/dL — ABNORMAL LOW (ref 12.0–15.0)
Lymphocytes Relative: 26 % (ref 12–46)
Lymphs Abs: 1.6 10*3/uL (ref 0.7–4.0)
MCH: 29.1 pg (ref 26.0–34.0)
MCHC: 32.5 g/dL (ref 30.0–36.0)
MCV: 89.7 fL (ref 78.0–100.0)
Monocytes Absolute: 0.5 10*3/uL (ref 0.1–1.0)
Monocytes Relative: 9 % (ref 3–12)
NEUTROS PCT: 65 % (ref 43–77)
Neutro Abs: 4 10*3/uL (ref 1.7–7.7)
PLATELETS: 165 10*3/uL (ref 150–400)
RBC: 3.88 MIL/uL (ref 3.87–5.11)
RDW: 14.5 % (ref 11.5–15.5)
WBC: 6.3 10*3/uL (ref 4.0–10.5)

## 2013-10-05 LAB — TROPONIN I
Troponin I: <0.3 ng/mL (ref <0.30)
Troponin I: <0.3 ng/mL (ref <0.30)

## 2013-10-05 MED ORDER — IOHEXOL 350 MG/ML SOLN
100.0000 mL | Freq: Once | INTRAVENOUS | Status: AC | PRN
  Administered 2013-10-05: 100 mL via INTRAVENOUS

## 2013-10-05 MED ORDER — SODIUM CHLORIDE 0.9 % IV BOLUS (SEPSIS)
500.0000 mL | Freq: Once | INTRAVENOUS | Status: AC
  Administered 2013-10-05: 500 mL via INTRAVENOUS

## 2013-10-05 NOTE — ED Provider Notes (Addendum)
CSN: [id number]     Arrival date & time 10/05/13  1723 History   First MD Initiated Contact with Patient 10/05/13 1742     Chief Complaint  Patient presents with  . Chest Pain     (Consider location/radiation/quality/duration/timing/severity/associated sxs/prior Treatment) HPI Comments: 79 yo female with HTN, MI 2006/ stent, lipids, a fib, pulm htn, warfarin, cad presents with right sharp CP lasting 2 min at a time, few episodes, resolved at 330.  Different than previous MI pain.  No recent exertional sxs or diaphoresis, pt feels well otherwise.  Pt has had left leg swelling, no known blood clot hx.  No infectious sxs.  Resolved on its own.  No recent cardiac eval.  Patient is a 79 y.o. female presenting with chest pain. The history is provided by the patient.  Chest Pain Associated symptoms: no abdominal pain, no back pain, no fever, no headache, no shortness of breath and not vomiting     Past Medical History  Diagnosis Date  . Hypertension   . Dyslipidemia   . Atrial fibrillation   . CAD (coronary artery disease)     Bare-metal stent December, 2006 /  Nuclear, October 11, 2010, question of small scar or ischemia in the inferolateral wall versus attenuation  . Degenerative arthritis of right knee   . Morbid obesity   . Psoriasis   . Carotid bruit     doppler, 2012, no significant abnormality  . Ejection fraction     EF 55-60%, echo, August, 2012  . Mitral regurgitation     Mild, echo, August, 2012  . Right ventricular dysfunction     Mild dilatation of the right ventricle, mild decreased right ventricular function, echo, August, 2012  . Tricuspid regurgitation     Severe, echo, August of 2012  . Pulmonary hypertension     moderate RHC  . Warfarin anticoagulation   . Partial, of this data file.   [name] Shortness of breath   . GERD (gastroesophageal reflux disease)   . Neuromuscular disorder     rt carpal tunnel   Past Surgical History  Procedure Laterality Date  .  Cholecystectomy    . Right ankle surgery    . Cardiac catheterization    . Coronary angioplasty  2006    stent  . Colonoscopy    . Carpal tunnel release  11/29/2011    Procedure: CARPAL TUNNEL RELEASE;  Surgeon: Cammie Sickle., MD;  Location: [facility];  Service: Orthopedics;  Laterality: Right;   Family History  Problem Relation Age of Onset  . Heart attack Mother   . Coronary artery disease Father   . Stroke Father   . Heart attack Brother   . Heart failure Brother   . COPD Brother   . Emphysema Sister     smoked  . Emphysema Brother     smoked   History  Substance Use Topics  . Smoking status: Former Smoker -- 0.30 packs/day for .5 years    Types: Cigarettes    Quit date: 08/19/1986  . Smokeless tobacco: Never Used     Comment: pt only smoked for only a few months.  . Alcohol Use: No   OB History   Grav Para Term Preterm Abortions TAB SAB Ect Mult Living                 Review of Systems  Constitutional: Negative for fever and chills.  HENT: Negative for congestion.   Eyes: Negative for  visual disturbance.  Respiratory: Negative for shortness of breath.   Cardiovascular: Positive for chest pain.  Gastrointestinal: Negative for vomiting and abdominal pain.  Genitourinary: Negative for dysuria and flank pain.  Musculoskeletal: Negative for back pain, neck pain and neck stiffness.  Skin: Negative for rash.  Neurological: Negative for syncope, light-headedness and headaches.      Allergies  Review of patient's allergies indicates no known allergies.  Home Medications   Current Outpatient Rx  Name  Route  Sig  Dispense  Refill  . ALPRAZolam (XANAX) 0.5 MG tablet   Oral   Take 0.75 mg by mouth at bedtime as needed for sleep.          [name] aspirin 81 MG tablet   Oral   Take 81 mg by mouth daily.           [name] atorvastatin (LIPITOR) 80 MG tablet   Oral   Take 40 mg by mouth daily.         . AZOPT 1 % ophthalmic suspension   Both  Eyes   Place 1 drop into both eyes 3 (three) times daily.          . Biotin 1000 MCG tablet   Oral   Take 1,000 mcg by mouth daily.           . Coenzyme Q10 (COQ10) 50 MG CAPS   Oral   Take 2 tablets by mouth daily.           . famotidine (PEPCID) 20 MG tablet   Oral   Take 20 mg by mouth daily as needed.          . fexofenadine (ALLEGRA) 180 MG tablet   Oral   Take 180 mg by mouth as needed.           . furosemide (LASIX) 40 MG tablet   Oral   Take 20 mg by mouth 2 (two) times daily.         [name] HYDROcodone-acetaminophen (VICODIN) 5-500 MG per tablet   Oral   Take 0.5 tablets by mouth every 4 (four) hours as needed for pain.          [name] latanoprost (XALATAN) 0.005 % ophthalmic solution   Right Eye   Place 1 drop into the right eye at bedtime.          [name] losartan (COZAAR) 100 MG tablet   Oral   Take 1 tablet (100 mg total) by mouth daily.   30 tablet   3     To replace Diovan   . nitroGLYCERIN (NITROSTAT) 0.4 MG SL tablet   Sublingual   Place 1 tablet (0.4 mg total) under the tongue every 5 (five) minutes as needed.   100 tablet   3   . potassium chloride SA (K-DUR,KLOR-CON) 20 MEQ tablet   Oral   Take 20 mEq by mouth daily.          [name] warfarin (COUMADIN) 5 MG tablet   Oral   Take 5-5.5 mg by mouth daily. Take 5mg  daily on Tues, Wed, Fri, Sat, Sun. and take 5.5mg  on Monday & Thursday ONLY          BP 132/62  Pulse 64  Resp 22  SpO2 95% Physical Exam  Nursing note and vitals reviewed. Constitutional: She is oriented to person, place, and time. She appears well-developed and well-nourished.  HENT:  Head: Normocephalic and atraumatic.  Eyes: Conjunctivae are normal. Right eye exhibits no discharge.  Left eye exhibits no discharge.  Neck: Normal range of motion. Neck supple. No tracheal deviation present.  Cardiovascular: Normal rate, regular rhythm and intact distal pulses.   Pulmonary/Chest: Effort normal and breath sounds normal.   Abdominal: Soft. She exhibits no distension. There is no tenderness. There is no guarding.  Musculoskeletal: She exhibits no edema and no tenderness.  Neurological: She is alert and oriented to person, place, and time.  Skin: Skin is warm. No rash noted.  Psychiatric: She has a normal mood and affect.    ED Course  Procedures (including critical care time) Labs Review Labs Reviewed  CBC WITH DIFFERENTIAL - Abnormal; Notable for the following:    Hemoglobin 11.3 (*)    HCT 34.8 (*)    All other components within normal limits  COMPREHENSIVE METABOLIC PANEL - Abnormal; Notable for the following:    Glucose, Bld 107 (*)    Albumin 3.3 (*)    GFR calc non Af Amer 67 (*)    GFR calc Af Amer 77 (*)    All other components within normal limits  TROPONIN I  TROPONIN I  PROTIME-INR   Imaging Review Ct Angio Chest Pe W/cm &/or Wo Cm  10/05/2013   CLINICAL DATA:  Right chest pain.  EXAM: CT ANGIOGRAPHY CHEST WITH CONTRAST  TECHNIQUE: Multidetector CT imaging of the chest was performed using the standard protocol during bolus administration of intravenous contrast. Multiplanar CT image reconstructions and MIPs were obtained to evaluate the vascular anatomy.  CONTRAST:  196mL OMNIPAQUE IOHEXOL 350 MG/ML SOLN  COMPARISON:  DG CHEST 2 VIEW dated 02/11/2013  FINDINGS: No filling defects in the pulmonary arteries to suggest pulmonary emboli. Cardiomegaly. Enlarged pulmonary arteries compatible with pulmonary arterial hypertension. Hyperinflation of the lungs compatible with COPD. 7 mm nodule is noted laterally in the left lower lobe. Small subpleural nodule in the left upper lobe on image 85. 8 mm nodule in the left upper lobe/ lingula on image 109. 8 mm nodule in the lingula on image 103. No pulmonary nodules on the right. No confluent areas of consolidation or pleural effusions.  No mediastinal, hilar, or axillary adenopathy. Chest wall soft tissues are unremarkable. Imaging into the upper abdomen shows  no acute findings.  Marked cardiomegaly. Coronary artery calcifications noted. No evidence of aortic aneurysm.  Review of the MIP images confirms the above findings.  IMPRESSION: No evidence of pulmonary embolus.  Marked cardiomegaly.  Evidence of pulmonary arterial hypertension.  COPD.  Scattered left lung pulmonary nodules measuring up to 8 mm in size. These are nonspecific. Metastatic disease cannot be excluded. Recommend clinical correlation for any history of cancer. These are borderline size for detection on PET CT. If the patient is at high risk for bronchogenic carcinoma, follow-up chest CT at 3-68months is recommended. If the patient is at low risk for bronchogenic carcinoma, follow-up chest CT at 6-12 months is recommended. This recommendation follows the consensus statement: Guidelines for Management of Small Pulmonary Nodules Detected on CT Scans: A Statement from the [place] as published in Radiology 2005; 237:395-400.   Electronically Signed   By: Rolm Baptise M.D.   On: 10/05/2013 21:13    EKG Interpretation    Date/Time:  Tuesday October 05 2013 17:28:58 EST Ventricular Rate:  78 PR Interval:    QRS Duration: 96 QT Interval:  374 QTC Calculation: 426 R Axis:   -36 Text Interpretation:  Atrial fibrillation Left axis deviation Low voltage QRS Incomplete right bundle branch block Similar to  previous Confirmed by Siobhan Zaro  MD, Johnwesley Lederman ([id number]) on 10/05/2013 8:40:10 PM            MDM   Final diagnoses:  Lung nodule  Chest pain, atypical  HYPERTENSION  A fibrillation  Atypical for cardiac, brief, multiple right flank pain.  No sxs on exam, well appearing, smiling. With cardiac hx plan for eval/ delta troponin. Pt had asa today. CT PE for blood clot with leg edema/ right sided chest pain. EKG no acute findings. NO cp during 6 hr ED obs, delta troponin neg, well appearing on recheck. Discussed CT results and fup for repeat CT, no known CA hx.    Results and  differential diagnosis were discussed with the patient. Close follow up outpatient was discussed, patient comfortable with the plan.         Mariea Clonts, MD 10/05/13 [id number]  Mariea Clonts, MD 10/05/13 [phone]

## 2013-10-05 NOTE — ED Notes (Signed)
Dr Manly at bedside 

## 2013-10-05 NOTE — Discharge Instructions (Signed)
If you were given medicines take as directed.  If you are on coumadin or contraceptives realize their levels and effectiveness is altered by many different medicines.  If you have any reaction (rash, tongues swelling, other) to the medicines stop taking and see a physician.   Please follow up as directed and return to the ER or see a physician for new or worsening symptoms.  Thank you. See your heart doctor to arrange stress test and return for any recurrent chest pain

## 2013-10-05 NOTE — ED Notes (Signed)
Pt c/o right sided CP intermittently x 2 hours; pt denies pain at present; pt noted to have irregular HR and sts hx of afib; pt denies SOB

## 2013-10-05 NOTE — ED Notes (Signed)
Called ct to let them know she had new iv placed for the scan that was ordered. sts will send for her shortly

## 2013-10-07 ENCOUNTER — Encounter: Payer: Self-pay | Admitting: Pulmonary Disease

## 2013-10-07 ENCOUNTER — Ambulatory Visit (INDEPENDENT_AMBULATORY_CARE_PROVIDER_SITE_OTHER): Payer: Medicare Other | Admitting: Pulmonary Disease

## 2013-10-07 VITALS — BP 132/90 | HR 63 | Temp 98.3°F | Ht 61.0 in | Wt 245.4 lb

## 2013-10-07 DIAGNOSIS — I272 Pulmonary hypertension, unspecified: Secondary | ICD-10-CM

## 2013-10-07 DIAGNOSIS — I2789 Other specified pulmonary heart diseases: Secondary | ICD-10-CM | POA: Diagnosis not present

## 2013-10-07 DIAGNOSIS — R918 Other nonspecific abnormal finding of lung field: Secondary | ICD-10-CM | POA: Diagnosis not present

## 2013-10-07 DIAGNOSIS — I2510 Atherosclerotic heart disease of native coronary artery without angina pectoris: Secondary | ICD-10-CM

## 2013-10-07 NOTE — Assessment & Plan Note (Addendum)
The patient is being treated with nocturnal oxygen, chronic anticoagulation, and control of her diastolic dysfunction. She feels that her breathing is stable from the last visit, but unfortunately has gained 19 pounds back after losing weight successfully. I have stressed to her the importance of aggressive weight loss, and this will probably affect her dyspnea on exertion and quality of life more than anything else.  Will consider a followup echocardiogram after the next visit.

## 2013-10-07 NOTE — Assessment & Plan Note (Signed)
The patient was recently found to have multiple pulmonary nodules unilaterally on a recent CT chest. She is in a low risk group with no personal history of cancer and very little smoking history in the past. By Fleischner criteria, she needs a followup scan in 6-12 months, and she has very nervous about this and wishes to do a scan at 6 rather than 12 months. Will arrange this through the computer system. It is very likely these are inflammatory or chronic in nature. I have however, reminded her to keep her health maintenance updated.

## 2013-10-07 NOTE — Patient Instructions (Signed)
Continue on oxygen at night, as well as your blood thinners. Work on weight loss.  This is the key to improving your breathing.  Will schedule for scan of chest in 40mos to followup your recent finding of pulmonary nodules. Make sure your health maintenance such as mammograms, pelvic exam, and pap smear are up to date.  followup with me again in 64mos.

## 2013-10-07 NOTE — Progress Notes (Signed)
   Subjective:    Patient ID: [name], female    DOB: [birth year], 79 y.o.   MRN: [id number]  HPI Patient comes in today for followup of her multifactorial pulmonary venous and arterial hypertension. She is staying on her oxygen therapy during sleep, and as well as her chronic anticoagulation for presumed chronic thromboembolic disease. She has lost significant weight at the last visit, but unfortunately has gained this back over the last 6 months. Despite this, she feels that her breathing has been fairly stable, and she is trying to stay as active as possible. A new problem today is the presence of pulmonary nodules, noted on recent CT scan after she had some atypical chest pain. The patient has no personal history of cancer, and has never lived in the [place]. She has no history of TB exposure, and her current health maintenance is overdue. She does have an appointment upcoming with her primary physician.   Review of Systems  Constitutional: Negative for fever and unexpected weight change.  HENT: Negative for congestion, dental problem, ear pain, nosebleeds, postnasal drip, rhinorrhea, sinus pressure, sneezing, sore throat and trouble swallowing.   Eyes: Negative for redness and itching.  Respiratory: Negative for cough, chest tightness, shortness of breath and wheezing.   Cardiovascular: Positive for chest pain ( right upper chest--related to lungs). Negative for palpitations and leg swelling.  Gastrointestinal: Negative for nausea and vomiting.       Acid heartburn/reflux  Genitourinary: Negative for dysuria.  Musculoskeletal: Negative for joint swelling.  Skin: Negative for rash.  Neurological: Negative for headaches.  Hematological: Does not bruise/bleed easily.  Psychiatric/Behavioral: Negative for dysphoric mood. The patient is not nervous/anxious.        Objective:   Physical Exam Morbidly obese female in no acute distress Nose without purulence or discharge  noted Neck without lymphadenopathy or thyromegaly Chest with totally clear breath sounds, no crackles or wheezes Cardiac exam with regular rate and rhythm Lower extremities with 1+ edema, no cyanosis Alert and oriented, moves all 4 extremities.       Assessment & Plan:

## 2013-10-20 ENCOUNTER — Ambulatory Visit (INDEPENDENT_AMBULATORY_CARE_PROVIDER_SITE_OTHER): Payer: Medicare Other | Admitting: *Deleted

## 2013-10-20 DIAGNOSIS — Z7901 Long term (current) use of anticoagulants: Secondary | ICD-10-CM

## 2013-10-20 DIAGNOSIS — I4891 Unspecified atrial fibrillation: Secondary | ICD-10-CM

## 2013-10-20 DIAGNOSIS — Z5181 Encounter for therapeutic drug level monitoring: Secondary | ICD-10-CM

## 2013-10-20 DIAGNOSIS — Z8679 Personal history of other diseases of the circulatory system: Secondary | ICD-10-CM

## 2013-10-20 LAB — POCT INR: INR: 2

## 2013-10-21 ENCOUNTER — Other Ambulatory Visit: Payer: Self-pay | Admitting: Cardiology

## 2013-10-27 DIAGNOSIS — I4891 Unspecified atrial fibrillation: Secondary | ICD-10-CM | POA: Diagnosis not present

## 2013-10-27 DIAGNOSIS — K219 Gastro-esophageal reflux disease without esophagitis: Secondary | ICD-10-CM | POA: Diagnosis not present

## 2013-10-27 DIAGNOSIS — R079 Chest pain, unspecified: Secondary | ICD-10-CM | POA: Diagnosis not present

## 2013-10-27 DIAGNOSIS — J309 Allergic rhinitis, unspecified: Secondary | ICD-10-CM | POA: Diagnosis not present

## 2013-11-08 ENCOUNTER — Other Ambulatory Visit: Payer: Self-pay | Admitting: Cardiology

## 2013-11-17 ENCOUNTER — Ambulatory Visit (INDEPENDENT_AMBULATORY_CARE_PROVIDER_SITE_OTHER): Payer: Medicare Other | Admitting: *Deleted

## 2013-11-17 DIAGNOSIS — I4891 Unspecified atrial fibrillation: Secondary | ICD-10-CM

## 2013-11-17 DIAGNOSIS — Z8679 Personal history of other diseases of the circulatory system: Secondary | ICD-10-CM | POA: Diagnosis not present

## 2013-11-17 DIAGNOSIS — Z5181 Encounter for therapeutic drug level monitoring: Secondary | ICD-10-CM

## 2013-11-17 DIAGNOSIS — Z7901 Long term (current) use of anticoagulants: Secondary | ICD-10-CM

## 2013-11-17 LAB — POCT INR: INR: 2.7

## 2013-11-29 DIAGNOSIS — H00019 Hordeolum externum unspecified eye, unspecified eyelid: Secondary | ICD-10-CM | POA: Diagnosis not present

## 2013-12-20 DIAGNOSIS — L408 Other psoriasis: Secondary | ICD-10-CM | POA: Diagnosis not present

## 2013-12-20 DIAGNOSIS — L608 Other nail disorders: Secondary | ICD-10-CM | POA: Diagnosis not present

## 2013-12-22 ENCOUNTER — Ambulatory Visit (INDEPENDENT_AMBULATORY_CARE_PROVIDER_SITE_OTHER): Payer: Medicare Other | Admitting: *Deleted

## 2013-12-22 DIAGNOSIS — Z8679 Personal history of other diseases of the circulatory system: Secondary | ICD-10-CM | POA: Diagnosis not present

## 2013-12-22 DIAGNOSIS — Z5181 Encounter for therapeutic drug level monitoring: Secondary | ICD-10-CM

## 2013-12-22 DIAGNOSIS — I4891 Unspecified atrial fibrillation: Secondary | ICD-10-CM

## 2013-12-22 DIAGNOSIS — Z7901 Long term (current) use of anticoagulants: Secondary | ICD-10-CM | POA: Diagnosis not present

## 2013-12-22 LAB — POCT INR: INR: 1.9

## 2013-12-25 ENCOUNTER — Telehealth: Payer: Self-pay | Admitting: Cardiology

## 2013-12-25 NOTE — Telephone Encounter (Signed)
[name] called answering service with question about Coumadin dose. Was last seen in Coumadin clinic on Wed 12/22/2013. INR 1.9. Instructed to take 10 mg Wed night, then 5 mg alternating with 7.5 mg for the remainder of week. Please see anticoagulation clinic notes for full instructions. [name] called to report she took 10 mg last night instead of 7.5 mg. She is due for 7.5 mg dose tonight. No evidence of bleeding. Instructed her to take 5 mg tonight instead of 7.5 mg then resume her schedule as previously outlined. Scheduled follow-up appt in Coumadin clinic on Monday 12/27/2013. [name] expressed verbal understanding and agrees to comply.

## 2013-12-27 ENCOUNTER — Ambulatory Visit (INDEPENDENT_AMBULATORY_CARE_PROVIDER_SITE_OTHER): Payer: Medicare Other | Admitting: *Deleted

## 2013-12-27 DIAGNOSIS — Z8679 Personal history of other diseases of the circulatory system: Secondary | ICD-10-CM

## 2013-12-27 DIAGNOSIS — Z5181 Encounter for therapeutic drug level monitoring: Secondary | ICD-10-CM

## 2013-12-27 DIAGNOSIS — I4891 Unspecified atrial fibrillation: Secondary | ICD-10-CM

## 2013-12-27 DIAGNOSIS — Z7901 Long term (current) use of anticoagulants: Secondary | ICD-10-CM | POA: Diagnosis not present

## 2013-12-27 LAB — POCT INR: INR: 2

## 2013-12-30 ENCOUNTER — Other Ambulatory Visit: Payer: Self-pay | Admitting: Cardiology

## 2014-01-17 ENCOUNTER — Ambulatory Visit (INDEPENDENT_AMBULATORY_CARE_PROVIDER_SITE_OTHER): Payer: Medicare Other | Admitting: *Deleted

## 2014-01-17 DIAGNOSIS — Z8679 Personal history of other diseases of the circulatory system: Secondary | ICD-10-CM

## 2014-01-17 DIAGNOSIS — Z7901 Long term (current) use of anticoagulants: Secondary | ICD-10-CM

## 2014-01-17 DIAGNOSIS — Z5181 Encounter for therapeutic drug level monitoring: Secondary | ICD-10-CM

## 2014-01-17 DIAGNOSIS — I4891 Unspecified atrial fibrillation: Secondary | ICD-10-CM | POA: Diagnosis not present

## 2014-01-17 LAB — POCT INR: INR: 3

## 2014-02-07 ENCOUNTER — Emergency Department (HOSPITAL_COMMUNITY): Payer: Medicare Other

## 2014-02-07 ENCOUNTER — Emergency Department (HOSPITAL_COMMUNITY)
Admission: EM | Admit: 2014-02-07 | Discharge: 2014-02-07 | Disposition: A | Discharge status: Home / Self Care | Payer: Medicare Other | Attending: Emergency Medicine | Admitting: Emergency Medicine

## 2014-02-07 ENCOUNTER — Encounter (HOSPITAL_COMMUNITY): Payer: Self-pay | Admitting: Emergency Medicine

## 2014-02-07 DIAGNOSIS — I10 Essential (primary) hypertension: Secondary | ICD-10-CM | POA: Insufficient documentation

## 2014-02-07 DIAGNOSIS — J0100 Acute maxillary sinusitis, unspecified: Secondary | ICD-10-CM | POA: Insufficient documentation

## 2014-02-07 DIAGNOSIS — I2510 Atherosclerotic heart disease of native coronary artery without angina pectoris: Secondary | ICD-10-CM | POA: Diagnosis not present

## 2014-02-07 DIAGNOSIS — I4891 Unspecified atrial fibrillation: Secondary | ICD-10-CM | POA: Insufficient documentation

## 2014-02-07 DIAGNOSIS — K219 Gastro-esophageal reflux disease without esophagitis: Secondary | ICD-10-CM | POA: Insufficient documentation

## 2014-02-07 DIAGNOSIS — Z87891 Personal history of nicotine dependence: Secondary | ICD-10-CM | POA: Diagnosis not present

## 2014-02-07 DIAGNOSIS — Z9861 Coronary angioplasty status: Secondary | ICD-10-CM | POA: Insufficient documentation

## 2014-02-07 DIAGNOSIS — Z7982 Long term (current) use of aspirin: Secondary | ICD-10-CM | POA: Diagnosis not present

## 2014-02-07 DIAGNOSIS — Z8669 Personal history of other diseases of the nervous system and sense organs: Secondary | ICD-10-CM | POA: Insufficient documentation

## 2014-02-07 DIAGNOSIS — Z7901 Long term (current) use of anticoagulants: Secondary | ICD-10-CM | POA: Insufficient documentation

## 2014-02-07 DIAGNOSIS — Z79899 Other long term (current) drug therapy: Secondary | ICD-10-CM | POA: Insufficient documentation

## 2014-02-07 DIAGNOSIS — Z9889 Other specified postprocedural states: Secondary | ICD-10-CM | POA: Diagnosis not present

## 2014-02-07 DIAGNOSIS — J984 Other disorders of lung: Secondary | ICD-10-CM | POA: Diagnosis not present

## 2014-02-07 DIAGNOSIS — Z872 Personal history of diseases of the skin and subcutaneous tissue: Secondary | ICD-10-CM | POA: Insufficient documentation

## 2014-02-07 DIAGNOSIS — E785 Hyperlipidemia, unspecified: Secondary | ICD-10-CM | POA: Diagnosis not present

## 2014-02-07 DIAGNOSIS — I252 Old myocardial infarction: Secondary | ICD-10-CM | POA: Insufficient documentation

## 2014-02-07 DIAGNOSIS — IMO0002 Reserved for concepts with insufficient information to code with codable children: Secondary | ICD-10-CM

## 2014-02-07 DIAGNOSIS — M1710 Unilateral primary osteoarthritis, unspecified knee: Secondary | ICD-10-CM | POA: Diagnosis not present

## 2014-02-07 MED ORDER — MOMETASONE FUROATE 50 MCG/ACT NA SUSP: 2.0000 | Freq: Every day | NASAL | Status: DC

## 2014-02-07 MED ORDER — AZITHROMYCIN 250 MG PO TABS: ORAL_TABLET | ORAL | Status: DC

## 2014-02-07 NOTE — ED Provider Notes (Signed)
CSN: [id number]     Arrival date & time 02/07/14  1604 History  This chart was scribed for non-physician practitioner, Cleatrice Burke PA-C, working with Ezequiel Essex, MD by Lowella Petties, ED Scribe. The patient was seen in room TR08C/TR08C. Patient's care was started at 4:58 PM.     Chief Complaint  Patient presents with  . Facial Pain   The history is provided by the patient. No language interpreter was used.   HPI Comments: [name] is a 79 y.o. female with a history of HTN, A Fib, hyperlipidemia, and MI (2006) who presents to the Emergency Department complaining of right sided facial pain onset 3 days ago. She states that it feels similar to sinus infections she has experienced in the past. She reports associated cough, rhinorrhea. She feels the pain radiates into her teeth. She reports trying saline spray with minimal relief. She states she gargled hydrogen peroxide to avoid a sore throat. She states she is on 2L O2 at home. She denies SOB and ear pain.  She reports a medical history of MI in 2006 and states that she takes coumadin regularly.   Cardiologist - Dr. Ron Parker  Past Medical History  Diagnosis Date  . Hypertension   . Dyslipidemia   . Atrial fibrillation   . CAD (coronary artery disease)     Bare-metal stent December, 2006 /  Nuclear, October 11, 2010, question of small scar or ischemia in the inferolateral wall versus attenuation  . Degenerative arthritis of right knee   . Morbid obesity   . Psoriasis   . Carotid bruit     doppler, 2012, no significant abnormality  . Ejection fraction     EF 55-60%, echo, August, 2012  . Mitral regurgitation     Mild, echo, August, 2012  . Right ventricular dysfunction     Mild dilatation of the right ventricle, mild decreased right ventricular function, echo, August, 2012  . Tricuspid regurgitation     Severe, echo, August of 2012  . Pulmonary hypertension     moderate RHC  . Warfarin anticoagulation   . Partial, of  this data file.   [name] Shortness of breath   . GERD (gastroesophageal reflux disease)   . Neuromuscular disorder     rt carpal tunnel   Past Surgical History  Procedure Laterality Date  . Cholecystectomy    . Right ankle surgery    . Cardiac catheterization    . Coronary angioplasty  2006    stent  . Colonoscopy    . Carpal tunnel release  11/29/2011    Procedure: CARPAL TUNNEL RELEASE;  Surgeon: Cammie Sickle., MD;  Location: [facility];  Service: Orthopedics;  Laterality: Right;   Family History  Problem Relation Age of Onset  . Heart attack Mother   . Coronary artery disease Father   . Stroke Father   . Heart attack Brother   . Heart failure Brother   . COPD Brother   . Emphysema Sister     smoked  . Emphysema Brother     smoked   History  Substance Use Topics  . Smoking status: Former Smoker -- 0.30 packs/day for .5 years    Types: Cigarettes    Quit date: 08/19/1986  . Smokeless tobacco: Never Used     Comment: pt only smoked for only a few months.  . Alcohol Use: No   OB History   Grav Para Term Preterm Abortions TAB SAB Ect Mult  Living                 Review of Systems  HENT: Positive for rhinorrhea and sinus pressure. Negative for ear pain and sore throat.   Respiratory: Positive for cough. Negative for shortness of breath.   All other systems reviewed and are negative.     Allergies  Review of patient's allergies indicates no known allergies.  Home Medications   Prior to Admission medications   Medication Sig Start Date End Date Taking? Authorizing Riyan Haile  ALPRAZolam Duanne Moron) 0.5 MG tablet Take 0.75 mg by mouth at bedtime as needed for sleep.     Historical Fransisco Messmer, MD  aspirin 81 MG tablet Take 81 mg by mouth daily.      Historical Adamariz Gillott, MD  atorvastatin (LIPITOR) 80 MG tablet Take 40 mg by mouth daily.    Historical Careli Luzader, MD  atorvastatin (LIPITOR) 80 MG tablet TAKE 1/2 TABLET BY MOUTH DAILY 11/08/13   Carlena Bjornstad,  MD  AZOPT 1 % ophthalmic suspension Place 1 drop into both eyes 3 (three) times daily.  05/02/12   Historical Rumi Taras, MD  Biotin 1000 MCG tablet Take 1,000 mcg by mouth daily.      Historical Doyel Mulkern, MD  Coenzyme Q10 (COQ10) 50 MG CAPS Take 2 tablets by mouth daily.      Historical Mamta Rimmer, MD  famotidine (PEPCID) 20 MG tablet Take 20 mg by mouth daily as needed.     Historical Deontrae Drinkard, MD  fexofenadine (ALLEGRA) 180 MG tablet Take 180 mg by mouth as needed.      Historical Rajah Lamba, MD  furosemide (LASIX) 20 MG tablet Take 20 mg by mouth daily.    Historical Eriona Kinchen, MD  HYDROcodone-acetaminophen (VICODIN) 5-500 MG per tablet Take 0.5 tablets by mouth every 4 (four) hours as needed for pain.     Historical Anden Bartolo, MD  latanoprost (XALATAN) 0.005 % ophthalmic solution Place 1 drop into the right eye at bedtime.  03/30/12   Historical Jenyfer Trawick, MD  losartan (COZAAR) 100 MG tablet TAKE 1 TABLET BY MOUTH DAILY.    Carlena Bjornstad, MD  NITROSTAT 0.4 MG SL tablet PLACE 1 TABLET UNDER THE TONGUE EVERY 5 MINUTES AS NEEDED.    Larey Dresser, MD  potassium chloride SA (K-DUR,KLOR-CON) 20 MEQ tablet Take 20 mEq by mouth daily.     Historical Bradee Common, MD  warfarin (COUMADIN) 5 MG tablet Take 1 tablet daily except 1 1/2 tablets on Mondays, Wednesdays and Fridays or as directed 10/22/13   Carlena Bjornstad, MD   Triage Vitals: BP 118/66  Pulse 77  Temp(Src) 98.1 F (36.7 C) (Oral)  SpO2 95% Physical Exam  Nursing note and vitals reviewed. Constitutional: She is oriented to person, place, and time. She appears well-developed and well-nourished. No distress.  Well appearing  HENT:  Head: Normocephalic and atraumatic.  Right Ear: Tympanic membrane, external ear and ear canal normal.  Left Ear: Tympanic membrane, external ear and ear canal normal.  Nose: Right sinus exhibits maxillary sinus tenderness. Right sinus exhibits no frontal sinus tenderness. Left sinus exhibits no maxillary sinus tenderness  and no frontal sinus tenderness.  Mouth/Throat: Oropharynx is clear and moist.  Swollen right nasal turbinate.   Eyes: Conjunctivae are normal.  Neck: Normal range of motion.  Cardiovascular: Normal rate, regular rhythm and normal heart sounds.   Pulmonary/Chest: Effort normal and breath sounds normal. No stridor. No respiratory distress. She has no wheezes. She has no rales.  Abdominal: Soft. She exhibits  no distension.  Musculoskeletal: Normal range of motion.  Neurological: She is alert and oriented to person, place, and time. She has normal strength.  Skin: Skin is warm and dry. She is not diaphoretic. No erythema.  Psychiatric: She has a normal mood and affect. Her behavior is normal.    ED Course  Procedures (including critical care time) DIAGNOSTIC STUDIES: Oxygen Saturation is 95% on room air, adequate by my interpretation.    COORDINATION OF CARE: 5:04 PM-Discussed treatment plan which includes a CXR and nasal steroidwith pt at bedside and pt agreed to plan.   Labs Review Labs Reviewed - No data to display  Imaging Review Dg Chest 2 View (if Patient Has Fever And/or Copd)  02/07/2014   CLINICAL DATA:  Sore throat.  Cough.  Rider radius.  EXAM: CHEST  2 VIEW  COMPARISON:  Multiple exams, including 10/05/2013  FINDINGS: Prominent hilum likely reflecting pulmonary arterial hypertension. Enlarged cardiopericardial silhouette particularly with right atrial enlargement.  Old upper lumbar compression fracture.  Thoracic spondylosis.  The pulmonary nodules seen on CT are not well appreciated on radiography, and accordingly may be present but occult, or resolved. It is not uncommon for nodules of this size to be occult on chest radiography.  IMPRESSION: 1. Stable cardiomegaly including right heart enlargement. 2. Stable prominence of the hila attributable to pulmonary arterial hypertension. 3. The patient has pulmonary nodules which are being followed by CT, observed on the CT scan from 4  months ago. Recommendations from that exam for followup are still in effect.   Electronically Signed   By: Sherryl Barters M.D.   On: 02/07/2014 18:07     EKG Interpretation None      MDM   Final diagnoses:  Acute maxillary sinusitis, recurrence not specified   Patient complaining of symptoms of sinusitis.    Patient with sinus tenderness and cough. Patient is afebrile and generally well appearing. Vital signs stable. CXR is stable.  Patient discharged with azithromycin. She reports this particular antibiotic works the best for her sinus infections. She has swollen right nasal turbinate and rx for nasacort was given.  Instructions given for warm saline nasal wash and recommendations for follow-up with primary care physician.  Dr. Wyvonnia Dusky evaluated patient and agrees with plan. Patient / Family / Caregiver informed of clinical course, understand medical decision-making process, and agree with plan.   I personally performed the services described in this documentation, which was scribed in my presence. The recorded information has been reviewed and is accurate.    Elwyn Lade, PA-C 02/07/14 2134

## 2014-02-07 NOTE — Discharge Instructions (Signed)
Sinusitis Sinusitis is redness, soreness, and swelling (inflammation) of the paranasal sinuses. Paranasal sinuses are air pockets within the bones of your face (beneath the eyes, the middle of the forehead, or above the eyes). In healthy paranasal sinuses, mucus is able to drain out, and air is able to circulate through them by way of your nose. However, when your paranasal sinuses are inflamed, mucus and air can become trapped. This can allow bacteria and other germs to grow and cause infection. Sinusitis can develop quickly and last only a short time (acute) or continue over a long period (chronic). Sinusitis that lasts for more than 12 weeks is considered chronic.  CAUSES  Causes of sinusitis include:  Allergies.  Structural abnormalities, such as displacement of the cartilage that separates your nostrils (deviated septum), which can decrease the air flow through your nose and sinuses and affect sinus drainage.  Functional abnormalities, such as when the small hairs (cilia) that line your sinuses and help remove mucus do not work properly or are not present. SYMPTOMS  Symptoms of acute and chronic sinusitis are the same. The primary symptoms are pain and pressure around the affected sinuses. Other symptoms include:  Upper toothache.  Earache.  Headache.  Bad breath.  Decreased sense of smell and taste.  A cough, which worsens when you are lying flat.  Fatigue.  Fever.  Thick drainage from your nose, which often is green and may contain pus (purulent).  Swelling and warmth over the affected sinuses. DIAGNOSIS  Your caregiver will perform a physical exam. During the exam, your caregiver may:  Look in your nose for signs of abnormal growths in your nostrils (nasal polyps).  Tap over the affected sinus to check for signs of infection.  View the inside of your sinuses (endoscopy) with a special imaging device with a light attached (endoscope), which is inserted into your  sinuses. If your caregiver suspects that you have chronic sinusitis, one or more of the following tests may be recommended:  Allergy tests.  Nasal culture--A sample of mucus is taken from your nose and sent to a lab and screened for bacteria.  Nasal cytology--A sample of mucus is taken from your nose and examined by your caregiver to determine if your sinusitis is related to an allergy. TREATMENT  Most cases of acute sinusitis are related to a viral infection and will resolve on their own within 10 days. Sometimes medicines are prescribed to help relieve symptoms (pain medicine, decongestants, nasal steroid sprays, or saline sprays).  However, for sinusitis related to a bacterial infection, your caregiver will prescribe antibiotic medicines. These are medicines that will help kill the bacteria causing the infection.  Rarely, sinusitis is caused by a fungal infection. In theses cases, your caregiver will prescribe antifungal medicine. For some cases of chronic sinusitis, surgery is needed. Generally, these are cases in which sinusitis recurs more than 3 times per year, despite other treatments. HOME CARE INSTRUCTIONS   Drink plenty of water. Water helps thin the mucus so your sinuses can drain more easily.  Use a humidifier.  Inhale steam 3 to 4 times a day (for example, sit in the bathroom with the shower running).  Apply a warm, moist washcloth to your face 3 to 4 times a day, or as directed by your caregiver.  Use saline nasal sprays to help moisten and clean your sinuses.  Take over-the-counter or prescription medicines for pain, discomfort, or fever only as directed by your caregiver. SEEK IMMEDIATE MEDICAL CARE IF:    You have increasing pain or severe headaches.  You have nausea, vomiting, or drowsiness.  You have swelling around your face.  You have vision problems.  You have a stiff neck.  You have difficulty breathing. MAKE SURE YOU:   Understand these  instructions.  Will watch your condition.  Will get help right away if you are not doing well or get worse. Document Released: 08/05/2005 Document Revised: 10/28/2011 Document Reviewed: 08/20/2011 Exit[employer] Patient Information 2015 [employer]. This information is not intended to replace advice given to you by your health care provider. Make sure you discuss any questions you have with your health care provider.  

## 2014-02-07 NOTE — ED Notes (Signed)
Pt states she feels like shes getting a sinus infection and she thinks shes getting a yeast infection on her neck. This week shes had a dry cough, runny nose, sore throat, chills, hoarse voice. She is alert and breathing easily now

## 2014-02-08 NOTE — ED Provider Notes (Signed)
Medical screening examination/treatment/procedure(s) were conducted as a shared visit with non-physician practitioner(s) and myself.  I personally evaluated the patient during the encounter.  3 days of facial pain with cough, congestion and rhinorrhea. No headache, chest pain or shortness of breath. No trauma.   EKG Interpretation None        Ezequiel Essex, MD 02/08/14 [phone]

## 2014-02-14 ENCOUNTER — Ambulatory Visit (INDEPENDENT_AMBULATORY_CARE_PROVIDER_SITE_OTHER): Payer: Medicare Other | Admitting: *Deleted

## 2014-02-14 DIAGNOSIS — I4891 Unspecified atrial fibrillation: Secondary | ICD-10-CM | POA: Diagnosis not present

## 2014-02-14 DIAGNOSIS — Z5181 Encounter for therapeutic drug level monitoring: Secondary | ICD-10-CM | POA: Diagnosis not present

## 2014-02-14 DIAGNOSIS — I482 Chronic atrial fibrillation, unspecified: Secondary | ICD-10-CM

## 2014-02-14 DIAGNOSIS — Z8679 Personal history of other diseases of the circulatory system: Secondary | ICD-10-CM | POA: Diagnosis not present

## 2014-02-14 DIAGNOSIS — Z7901 Long term (current) use of anticoagulants: Secondary | ICD-10-CM

## 2014-02-14 LAB — POCT INR: INR: 4.2

## 2014-02-15 DIAGNOSIS — B354 Tinea corporis: Secondary | ICD-10-CM | POA: Diagnosis not present

## 2014-02-15 DIAGNOSIS — Z6841 Body Mass Index (BMI) 40.0 and over, adult: Secondary | ICD-10-CM | POA: Diagnosis not present

## 2014-02-15 DIAGNOSIS — N390 Urinary tract infection, site not specified: Secondary | ICD-10-CM | POA: Diagnosis not present

## 2014-02-16 ENCOUNTER — Telehealth: Payer: Self-pay | Admitting: *Deleted

## 2014-02-16 NOTE — Telephone Encounter (Signed)
Pt was suppose to take coumadin 2.5mg  last night but she forgot and took 7.5mg .  Yesterday she started Diflucan x 3 days and Cipro x 7 days for UTI.  Told pt to hold coumadin tonight, take 5mg  x 3 days (Thurs,Fri,Sat) and restart regular dose (7.5mg  daily except 5mg  on S,T,Th) on Sunday.  Recheck INR 7/16.  Pt verbalized understanding.

## 2014-02-16 NOTE — Telephone Encounter (Signed)
Patient states that she got mixed up last night on her coumadin dosage. / tgs

## 2014-02-24 DIAGNOSIS — L538 Other specified erythematous conditions: Secondary | ICD-10-CM | POA: Diagnosis not present

## 2014-03-03 ENCOUNTER — Ambulatory Visit (INDEPENDENT_AMBULATORY_CARE_PROVIDER_SITE_OTHER): Payer: Medicare Other | Admitting: *Deleted

## 2014-03-03 DIAGNOSIS — Z7901 Long term (current) use of anticoagulants: Secondary | ICD-10-CM

## 2014-03-03 DIAGNOSIS — Z8679 Personal history of other diseases of the circulatory system: Secondary | ICD-10-CM

## 2014-03-03 DIAGNOSIS — Z5181 Encounter for therapeutic drug level monitoring: Secondary | ICD-10-CM

## 2014-03-03 DIAGNOSIS — I4891 Unspecified atrial fibrillation: Secondary | ICD-10-CM | POA: Diagnosis not present

## 2014-03-03 LAB — POCT INR: INR: 3.5

## 2014-03-24 ENCOUNTER — Ambulatory Visit (INDEPENDENT_AMBULATORY_CARE_PROVIDER_SITE_OTHER): Payer: Medicare Other | Admitting: *Deleted

## 2014-03-24 DIAGNOSIS — Z5181 Encounter for therapeutic drug level monitoring: Secondary | ICD-10-CM

## 2014-03-24 DIAGNOSIS — Z7901 Long term (current) use of anticoagulants: Secondary | ICD-10-CM

## 2014-03-24 DIAGNOSIS — Z8679 Personal history of other diseases of the circulatory system: Secondary | ICD-10-CM | POA: Diagnosis not present

## 2014-03-24 DIAGNOSIS — I4891 Unspecified atrial fibrillation: Secondary | ICD-10-CM | POA: Diagnosis not present

## 2014-03-24 LAB — POCT INR: INR: 1.9

## 2014-03-29 DIAGNOSIS — H26499 Other secondary cataract, unspecified eye: Secondary | ICD-10-CM | POA: Diagnosis not present

## 2014-03-29 DIAGNOSIS — H4011X0 Primary open-angle glaucoma, stage unspecified: Secondary | ICD-10-CM | POA: Diagnosis not present

## 2014-03-29 DIAGNOSIS — H04129 Dry eye syndrome of unspecified lacrimal gland: Secondary | ICD-10-CM | POA: Diagnosis not present

## 2014-03-29 DIAGNOSIS — H2510 Age-related nuclear cataract, unspecified eye: Secondary | ICD-10-CM | POA: Diagnosis not present

## 2014-04-04 ENCOUNTER — Ambulatory Visit (INDEPENDENT_AMBULATORY_CARE_PROVIDER_SITE_OTHER)
Admission: RE | Admit: 2014-04-04 | Discharge: 2014-04-04 | Disposition: A | Discharge status: Home / Self Care | Payer: Medicare Other | Source: Ambulatory Visit | Attending: Pulmonary Disease | Admitting: Pulmonary Disease

## 2014-04-04 DIAGNOSIS — R918 Other nonspecific abnormal finding of lung field: Secondary | ICD-10-CM | POA: Diagnosis not present

## 2014-04-04 DIAGNOSIS — J984 Other disorders of lung: Secondary | ICD-10-CM | POA: Diagnosis not present

## 2014-04-05 DIAGNOSIS — M722 Plantar fascial fibromatosis: Secondary | ICD-10-CM | POA: Diagnosis not present

## 2014-04-05 DIAGNOSIS — Z6841 Body Mass Index (BMI) 40.0 and over, adult: Secondary | ICD-10-CM | POA: Diagnosis not present

## 2014-04-06 ENCOUNTER — Other Ambulatory Visit: Payer: Self-pay | Admitting: Pulmonary Disease

## 2014-04-06 DIAGNOSIS — R918 Other nonspecific abnormal finding of lung field: Secondary | ICD-10-CM

## 2014-04-08 ENCOUNTER — Ambulatory Visit: Payer: Medicare Other | Admitting: Pulmonary Disease

## 2014-04-08 DIAGNOSIS — M1710 Unilateral primary osteoarthritis, unspecified knee: Secondary | ICD-10-CM | POA: Diagnosis not present

## 2014-04-08 DIAGNOSIS — M722 Plantar fascial fibromatosis: Secondary | ICD-10-CM | POA: Diagnosis not present

## 2014-04-09 ENCOUNTER — Encounter (HOSPITAL_COMMUNITY): Payer: Self-pay

## 2014-04-09 ENCOUNTER — Ambulatory Visit (HOSPITAL_COMMUNITY)
Admission: RE | Admit: 2014-04-09 | Discharge: 2014-04-09 | Disposition: A | Discharge status: Home / Self Care | Payer: Medicare Other | Source: Ambulatory Visit | Attending: Pulmonary Disease | Admitting: Pulmonary Disease

## 2014-04-09 DIAGNOSIS — Z9889 Other specified postprocedural states: Secondary | ICD-10-CM | POA: Diagnosis not present

## 2014-04-09 DIAGNOSIS — R918 Other nonspecific abnormal finding of lung field: Secondary | ICD-10-CM

## 2014-04-09 DIAGNOSIS — J984 Other disorders of lung: Secondary | ICD-10-CM | POA: Diagnosis not present

## 2014-04-09 DIAGNOSIS — Q638 Other specified congenital malformations of kidney: Secondary | ICD-10-CM | POA: Insufficient documentation

## 2014-04-09 DIAGNOSIS — I517 Cardiomegaly: Secondary | ICD-10-CM | POA: Diagnosis not present

## 2014-04-09 DIAGNOSIS — K449 Diaphragmatic hernia without obstruction or gangrene: Secondary | ICD-10-CM | POA: Diagnosis not present

## 2014-04-09 DIAGNOSIS — I2510 Atherosclerotic heart disease of native coronary artery without angina pectoris: Secondary | ICD-10-CM | POA: Insufficient documentation

## 2014-04-09 DIAGNOSIS — E041 Nontoxic single thyroid nodule: Secondary | ICD-10-CM | POA: Diagnosis not present

## 2014-04-09 DIAGNOSIS — I700 Atherosclerosis of aorta: Secondary | ICD-10-CM | POA: Diagnosis not present

## 2014-04-09 LAB — GLUCOSE, CAPILLARY: Glucose-Capillary: 97 mg/dL (ref 70–99)

## 2014-04-09 MED ORDER — FLUDEOXYGLUCOSE F - 18 (FDG) INJECTION: 13.2000 | Freq: Once | INTRAVENOUS | Status: AC | PRN

## 2014-04-13 ENCOUNTER — Other Ambulatory Visit: Payer: Self-pay | Admitting: Pulmonary Disease

## 2014-04-13 DIAGNOSIS — R918 Other nonspecific abnormal finding of lung field: Secondary | ICD-10-CM

## 2014-04-14 ENCOUNTER — Ambulatory Visit (INDEPENDENT_AMBULATORY_CARE_PROVIDER_SITE_OTHER): Payer: Medicare Other | Admitting: *Deleted

## 2014-04-14 DIAGNOSIS — Z5181 Encounter for therapeutic drug level monitoring: Secondary | ICD-10-CM | POA: Diagnosis not present

## 2014-04-14 DIAGNOSIS — Z7901 Long term (current) use of anticoagulants: Secondary | ICD-10-CM | POA: Diagnosis not present

## 2014-04-14 DIAGNOSIS — I4891 Unspecified atrial fibrillation: Secondary | ICD-10-CM | POA: Diagnosis not present

## 2014-04-14 DIAGNOSIS — Z8679 Personal history of other diseases of the circulatory system: Secondary | ICD-10-CM

## 2014-04-14 LAB — POCT INR: INR: 2.9

## 2014-04-19 DIAGNOSIS — M1710 Unilateral primary osteoarthritis, unspecified knee: Secondary | ICD-10-CM | POA: Diagnosis not present

## 2014-04-21 DIAGNOSIS — M1710 Unilateral primary osteoarthritis, unspecified knee: Secondary | ICD-10-CM | POA: Diagnosis not present

## 2014-04-26 ENCOUNTER — Ambulatory Visit: Payer: Medicare Other | Admitting: Pulmonary Disease

## 2014-04-26 DIAGNOSIS — M722 Plantar fascial fibromatosis: Secondary | ICD-10-CM | POA: Diagnosis not present

## 2014-04-26 DIAGNOSIS — M1710 Unilateral primary osteoarthritis, unspecified knee: Secondary | ICD-10-CM | POA: Diagnosis not present

## 2014-04-28 DIAGNOSIS — M1710 Unilateral primary osteoarthritis, unspecified knee: Secondary | ICD-10-CM | POA: Diagnosis not present

## 2014-05-02 ENCOUNTER — Other Ambulatory Visit: Payer: Self-pay | Admitting: Cardiology

## 2014-05-03 DIAGNOSIS — M1710 Unilateral primary osteoarthritis, unspecified knee: Secondary | ICD-10-CM | POA: Diagnosis not present

## 2014-05-05 DIAGNOSIS — M1710 Unilateral primary osteoarthritis, unspecified knee: Secondary | ICD-10-CM | POA: Diagnosis not present

## 2014-05-06 DIAGNOSIS — Z23 Encounter for immunization: Secondary | ICD-10-CM | POA: Diagnosis not present

## 2014-05-10 DIAGNOSIS — H6990 Unspecified Eustachian tube disorder, unspecified ear: Secondary | ICD-10-CM | POA: Diagnosis not present

## 2014-05-10 DIAGNOSIS — J019 Acute sinusitis, unspecified: Secondary | ICD-10-CM | POA: Diagnosis not present

## 2014-05-10 DIAGNOSIS — Z6841 Body Mass Index (BMI) 40.0 and over, adult: Secondary | ICD-10-CM | POA: Diagnosis not present

## 2014-05-16 ENCOUNTER — Ambulatory Visit (INDEPENDENT_AMBULATORY_CARE_PROVIDER_SITE_OTHER): Payer: Medicare Other | Admitting: *Deleted

## 2014-05-16 DIAGNOSIS — I4891 Unspecified atrial fibrillation: Secondary | ICD-10-CM

## 2014-05-16 DIAGNOSIS — Z7901 Long term (current) use of anticoagulants: Secondary | ICD-10-CM | POA: Diagnosis not present

## 2014-05-16 DIAGNOSIS — Z8679 Personal history of other diseases of the circulatory system: Secondary | ICD-10-CM

## 2014-05-16 DIAGNOSIS — Z5181 Encounter for therapeutic drug level monitoring: Secondary | ICD-10-CM

## 2014-05-16 LAB — POCT INR: INR: 3.8

## 2014-05-19 DIAGNOSIS — M1711 Unilateral primary osteoarthritis, right knee: Secondary | ICD-10-CM | POA: Diagnosis not present

## 2014-05-20 DIAGNOSIS — M722 Plantar fascial fibromatosis: Secondary | ICD-10-CM | POA: Diagnosis not present

## 2014-05-25 DIAGNOSIS — M170 Bilateral primary osteoarthritis of knee: Secondary | ICD-10-CM | POA: Diagnosis not present

## 2014-05-25 DIAGNOSIS — M25439 Effusion, unspecified wrist: Secondary | ICD-10-CM | POA: Diagnosis not present

## 2014-05-25 DIAGNOSIS — M25472 Effusion, left ankle: Secondary | ICD-10-CM | POA: Diagnosis not present

## 2014-05-25 DIAGNOSIS — L409 Psoriasis, unspecified: Secondary | ICD-10-CM | POA: Diagnosis not present

## 2014-05-26 ENCOUNTER — Encounter: Payer: Self-pay | Admitting: Internal Medicine

## 2014-06-06 ENCOUNTER — Ambulatory Visit (INDEPENDENT_AMBULATORY_CARE_PROVIDER_SITE_OTHER): Payer: Medicare Other | Admitting: *Deleted

## 2014-06-06 DIAGNOSIS — Z8679 Personal history of other diseases of the circulatory system: Secondary | ICD-10-CM

## 2014-06-06 DIAGNOSIS — Z7901 Long term (current) use of anticoagulants: Secondary | ICD-10-CM | POA: Diagnosis not present

## 2014-06-06 DIAGNOSIS — I4891 Unspecified atrial fibrillation: Secondary | ICD-10-CM

## 2014-06-06 DIAGNOSIS — Z5181 Encounter for therapeutic drug level monitoring: Secondary | ICD-10-CM

## 2014-06-06 LAB — POCT INR: INR: 5.2

## 2014-06-13 ENCOUNTER — Ambulatory Visit (INDEPENDENT_AMBULATORY_CARE_PROVIDER_SITE_OTHER): Payer: Medicare Other | Admitting: *Deleted

## 2014-06-13 DIAGNOSIS — I4891 Unspecified atrial fibrillation: Secondary | ICD-10-CM | POA: Diagnosis not present

## 2014-06-13 DIAGNOSIS — Z5181 Encounter for therapeutic drug level monitoring: Secondary | ICD-10-CM

## 2014-06-13 DIAGNOSIS — Z7901 Long term (current) use of anticoagulants: Secondary | ICD-10-CM | POA: Diagnosis not present

## 2014-06-13 DIAGNOSIS — Z8679 Personal history of other diseases of the circulatory system: Secondary | ICD-10-CM | POA: Diagnosis not present

## 2014-06-13 LAB — POCT INR: INR: 2.4

## 2014-06-22 ENCOUNTER — Ambulatory Visit (INDEPENDENT_AMBULATORY_CARE_PROVIDER_SITE_OTHER): Payer: Medicare Other | Admitting: *Deleted

## 2014-06-22 DIAGNOSIS — Z5181 Encounter for therapeutic drug level monitoring: Secondary | ICD-10-CM | POA: Diagnosis not present

## 2014-06-22 DIAGNOSIS — Z8679 Personal history of other diseases of the circulatory system: Secondary | ICD-10-CM

## 2014-06-22 DIAGNOSIS — Z7901 Long term (current) use of anticoagulants: Secondary | ICD-10-CM | POA: Diagnosis not present

## 2014-06-22 DIAGNOSIS — I4891 Unspecified atrial fibrillation: Secondary | ICD-10-CM | POA: Diagnosis not present

## 2014-06-22 LAB — POCT INR: INR: 2.2

## 2014-07-06 ENCOUNTER — Ambulatory Visit (INDEPENDENT_AMBULATORY_CARE_PROVIDER_SITE_OTHER): Payer: Medicare Other | Admitting: *Deleted

## 2014-07-06 DIAGNOSIS — Z8679 Personal history of other diseases of the circulatory system: Secondary | ICD-10-CM

## 2014-07-06 DIAGNOSIS — I4891 Unspecified atrial fibrillation: Secondary | ICD-10-CM | POA: Diagnosis not present

## 2014-07-06 DIAGNOSIS — Z5181 Encounter for therapeutic drug level monitoring: Secondary | ICD-10-CM

## 2014-07-06 DIAGNOSIS — Z7901 Long term (current) use of anticoagulants: Secondary | ICD-10-CM | POA: Diagnosis not present

## 2014-07-06 LAB — POCT INR: INR: 2.3

## 2014-07-20 ENCOUNTER — Other Ambulatory Visit: Payer: Self-pay | Admitting: Cardiology

## 2014-07-27 ENCOUNTER — Ambulatory Visit (INDEPENDENT_AMBULATORY_CARE_PROVIDER_SITE_OTHER): Payer: Medicare Other | Admitting: *Deleted

## 2014-07-27 DIAGNOSIS — Z7901 Long term (current) use of anticoagulants: Secondary | ICD-10-CM

## 2014-07-27 DIAGNOSIS — Z8679 Personal history of other diseases of the circulatory system: Secondary | ICD-10-CM

## 2014-07-27 DIAGNOSIS — I4891 Unspecified atrial fibrillation: Secondary | ICD-10-CM | POA: Diagnosis not present

## 2014-07-27 DIAGNOSIS — Z5181 Encounter for therapeutic drug level monitoring: Secondary | ICD-10-CM

## 2014-07-27 LAB — POCT INR: INR: 2

## 2014-08-04 DIAGNOSIS — L409 Psoriasis, unspecified: Secondary | ICD-10-CM | POA: Diagnosis not present

## 2014-08-04 DIAGNOSIS — M25432 Effusion, left wrist: Secondary | ICD-10-CM | POA: Diagnosis not present

## 2014-08-04 DIAGNOSIS — L4050 Arthropathic psoriasis, unspecified: Secondary | ICD-10-CM | POA: Diagnosis not present

## 2014-08-04 DIAGNOSIS — M25562 Pain in left knee: Secondary | ICD-10-CM | POA: Diagnosis not present

## 2014-08-04 DIAGNOSIS — M109 Gout, unspecified: Secondary | ICD-10-CM | POA: Diagnosis not present

## 2014-08-23 ENCOUNTER — Other Ambulatory Visit: Payer: Self-pay | Admitting: Cardiology

## 2014-08-24 ENCOUNTER — Ambulatory Visit (INDEPENDENT_AMBULATORY_CARE_PROVIDER_SITE_OTHER): Payer: Medicare Other | Admitting: *Deleted

## 2014-08-24 DIAGNOSIS — Z7901 Long term (current) use of anticoagulants: Secondary | ICD-10-CM | POA: Diagnosis not present

## 2014-08-24 DIAGNOSIS — I4891 Unspecified atrial fibrillation: Secondary | ICD-10-CM | POA: Diagnosis not present

## 2014-08-24 DIAGNOSIS — Z8679 Personal history of other diseases of the circulatory system: Secondary | ICD-10-CM | POA: Diagnosis not present

## 2014-08-24 DIAGNOSIS — Z5181 Encounter for therapeutic drug level monitoring: Secondary | ICD-10-CM

## 2014-08-24 LAB — POCT INR: INR: 1.9

## 2014-08-29 ENCOUNTER — Telehealth: Payer: Self-pay | Admitting: *Deleted

## 2014-08-29 NOTE — Telephone Encounter (Signed)
Medication concerns / tgs

## 2014-08-29 NOTE — Telephone Encounter (Signed)
Pt saw Dr Charlestine Night.  He wants her to start Colcrys 0.6mg s bid x several days to see if it helps pain in her foot.  Doesn't know if ot is gout or a spur.  She will start tonight and come for INR check on Wed. 08/31/14.  Pt in agreement.

## 2014-08-31 ENCOUNTER — Ambulatory Visit (INDEPENDENT_AMBULATORY_CARE_PROVIDER_SITE_OTHER): Payer: Medicare Other | Admitting: *Deleted

## 2014-08-31 DIAGNOSIS — I4891 Unspecified atrial fibrillation: Secondary | ICD-10-CM

## 2014-08-31 DIAGNOSIS — Z5181 Encounter for therapeutic drug level monitoring: Secondary | ICD-10-CM | POA: Diagnosis not present

## 2014-08-31 DIAGNOSIS — Z8679 Personal history of other diseases of the circulatory system: Secondary | ICD-10-CM

## 2014-08-31 DIAGNOSIS — N390 Urinary tract infection, site not specified: Secondary | ICD-10-CM | POA: Diagnosis not present

## 2014-08-31 DIAGNOSIS — Z7901 Long term (current) use of anticoagulants: Secondary | ICD-10-CM

## 2014-08-31 DIAGNOSIS — G894 Chronic pain syndrome: Secondary | ICD-10-CM | POA: Diagnosis not present

## 2014-08-31 DIAGNOSIS — B070 Plantar wart: Secondary | ICD-10-CM | POA: Diagnosis not present

## 2014-08-31 DIAGNOSIS — Z6841 Body Mass Index (BMI) 40.0 and over, adult: Secondary | ICD-10-CM | POA: Diagnosis not present

## 2014-08-31 LAB — POCT INR: INR: 1.8

## 2014-09-14 ENCOUNTER — Ambulatory Visit (INDEPENDENT_AMBULATORY_CARE_PROVIDER_SITE_OTHER): Payer: Medicare Other | Admitting: *Deleted

## 2014-09-14 DIAGNOSIS — Z8679 Personal history of other diseases of the circulatory system: Secondary | ICD-10-CM

## 2014-09-14 DIAGNOSIS — I4891 Unspecified atrial fibrillation: Secondary | ICD-10-CM

## 2014-09-14 DIAGNOSIS — Z5181 Encounter for therapeutic drug level monitoring: Secondary | ICD-10-CM | POA: Diagnosis not present

## 2014-09-14 DIAGNOSIS — Z7901 Long term (current) use of anticoagulants: Secondary | ICD-10-CM | POA: Diagnosis not present

## 2014-09-14 LAB — POCT INR: INR: 2.6

## 2014-09-27 ENCOUNTER — Ambulatory Visit (INDEPENDENT_AMBULATORY_CARE_PROVIDER_SITE_OTHER)
Admission: RE | Admit: 2014-09-27 | Discharge: 2014-09-27 | Disposition: A | Discharge status: Home / Self Care | Payer: Medicare Other | Source: Ambulatory Visit | Attending: Pulmonary Disease | Admitting: Pulmonary Disease

## 2014-09-27 DIAGNOSIS — R918 Other nonspecific abnormal finding of lung field: Secondary | ICD-10-CM

## 2014-09-27 DIAGNOSIS — I2510 Atherosclerotic heart disease of native coronary artery without angina pectoris: Secondary | ICD-10-CM | POA: Diagnosis not present

## 2014-09-29 ENCOUNTER — Other Ambulatory Visit: Payer: Self-pay | Admitting: Pulmonary Disease

## 2014-09-29 DIAGNOSIS — R911 Solitary pulmonary nodule: Secondary | ICD-10-CM

## 2014-10-11 DIAGNOSIS — H4011X3 Primary open-angle glaucoma, severe stage: Secondary | ICD-10-CM | POA: Diagnosis not present

## 2014-10-12 ENCOUNTER — Ambulatory Visit (INDEPENDENT_AMBULATORY_CARE_PROVIDER_SITE_OTHER): Payer: Medicare Other | Admitting: *Deleted

## 2014-10-12 DIAGNOSIS — Z8679 Personal history of other diseases of the circulatory system: Secondary | ICD-10-CM

## 2014-10-12 DIAGNOSIS — Z5181 Encounter for therapeutic drug level monitoring: Secondary | ICD-10-CM | POA: Diagnosis not present

## 2014-10-12 DIAGNOSIS — Z7901 Long term (current) use of anticoagulants: Secondary | ICD-10-CM | POA: Diagnosis not present

## 2014-10-12 DIAGNOSIS — I4891 Unspecified atrial fibrillation: Secondary | ICD-10-CM

## 2014-10-12 DIAGNOSIS — I480 Paroxysmal atrial fibrillation: Secondary | ICD-10-CM

## 2014-10-12 LAB — POCT INR: INR: 2.4

## 2014-10-19 DIAGNOSIS — G894 Chronic pain syndrome: Secondary | ICD-10-CM | POA: Diagnosis not present

## 2014-10-19 DIAGNOSIS — Z6841 Body Mass Index (BMI) 40.0 and over, adult: Secondary | ICD-10-CM | POA: Diagnosis not present

## 2014-10-27 DIAGNOSIS — J329 Chronic sinusitis, unspecified: Secondary | ICD-10-CM | POA: Diagnosis not present

## 2014-10-27 DIAGNOSIS — Z6841 Body Mass Index (BMI) 40.0 and over, adult: Secondary | ICD-10-CM | POA: Diagnosis not present

## 2014-11-09 ENCOUNTER — Ambulatory Visit (INDEPENDENT_AMBULATORY_CARE_PROVIDER_SITE_OTHER): Payer: Medicare Other | Admitting: *Deleted

## 2014-11-09 DIAGNOSIS — I480 Paroxysmal atrial fibrillation: Secondary | ICD-10-CM

## 2014-11-09 DIAGNOSIS — I4891 Unspecified atrial fibrillation: Secondary | ICD-10-CM | POA: Diagnosis not present

## 2014-11-09 DIAGNOSIS — Z7901 Long term (current) use of anticoagulants: Secondary | ICD-10-CM | POA: Diagnosis not present

## 2014-11-09 DIAGNOSIS — Z5181 Encounter for therapeutic drug level monitoring: Secondary | ICD-10-CM | POA: Diagnosis not present

## 2014-11-09 DIAGNOSIS — Z8679 Personal history of other diseases of the circulatory system: Secondary | ICD-10-CM

## 2014-11-09 LAB — POCT INR: INR: 2

## 2014-11-14 ENCOUNTER — Telehealth: Payer: Self-pay | Admitting: *Deleted

## 2014-11-14 NOTE — Telephone Encounter (Signed)
Chart reviewed.  INR 2.0 last week.  Told pt to take amoxicillin and continue current dose of coumadin.  May eat few extra greens if she wants to.  Pt verbalized understanding.

## 2014-11-21 ENCOUNTER — Encounter: Payer: Self-pay | Admitting: Cardiology

## 2014-11-21 ENCOUNTER — Ambulatory Visit (INDEPENDENT_AMBULATORY_CARE_PROVIDER_SITE_OTHER): Payer: Medicare Other | Admitting: Cardiology

## 2014-11-21 VITALS — BP 136/84 | HR 69 | Ht 61.0 in | Wt 231.0 lb

## 2014-11-21 DIAGNOSIS — I270 Primary pulmonary hypertension: Secondary | ICD-10-CM | POA: Diagnosis not present

## 2014-11-21 DIAGNOSIS — Z7901 Long term (current) use of anticoagulants: Secondary | ICD-10-CM

## 2014-11-21 DIAGNOSIS — I2510 Atherosclerotic heart disease of native coronary artery without angina pectoris: Secondary | ICD-10-CM

## 2014-11-21 DIAGNOSIS — I272 Pulmonary hypertension, unspecified: Secondary | ICD-10-CM

## 2014-11-21 DIAGNOSIS — I480 Paroxysmal atrial fibrillation: Secondary | ICD-10-CM

## 2014-11-21 DIAGNOSIS — R918 Other nonspecific abnormal finding of lung field: Secondary | ICD-10-CM

## 2014-11-21 DIAGNOSIS — E785 Hyperlipidemia, unspecified: Secondary | ICD-10-CM

## 2014-11-21 DIAGNOSIS — I10 Essential (primary) hypertension: Secondary | ICD-10-CM

## 2014-11-21 NOTE — Progress Notes (Signed)
Cardiology Office Note   Date:  11/21/2014   ID:  [name], DOB Oct 04, 1934, MRN [id number]  PCP:  [name]., MD  Cardiologist:  Dola Argyle, MD   Chief Complaint  Patient presents with  . Appointment    Follow-up atrial fibrillation.      History of Present Illness: [name] is a 80 y.o. female who presents today to follow-up atrial fibrillation. In addition there is a history of significant pulmonary hypertension. I saw the patient last February, 2015. Her atrial fibrillation has been stable. She is anticoagulated. She has significant pulmonary hypertension. She had a right heart catheterization in the past. It was felt that her pulmonary hypertension was multifactorial. She is known to our pulmonary team. She is not having any significant chest pain..  The patient had a question of pulmonary nodules. This was also followed by the pulmonary team. She has had appropriate follow-up scans. They have remained stable. She is scheduled to have one more in a year.    Past Medical History  Diagnosis Date  . Hypertension   . Dyslipidemia   . Atrial fibrillation   . CAD (coronary artery disease)     Bare-metal stent December, 2006 /  Nuclear, October 11, 2010, question of small scar or ischemia in the inferolateral wall versus attenuation  . Degenerative arthritis of right knee   . Morbid obesity   . Psoriasis   . Carotid bruit     doppler, 2012, no significant abnormality  . Ejection fraction     EF 55-60%, echo, August, 2012  . Mitral regurgitation     Mild, echo, August, 2012  . Right ventricular dysfunction     Mild dilatation of the right ventricle, mild decreased right ventricular function, echo, August, 2012  . Tricuspid regurgitation     Severe, echo, August of 2012  . Pulmonary hypertension     moderate RHC  . Warfarin anticoagulation   . Partial, of this data file.   [name] Shortness of breath   . GERD (gastroesophageal reflux disease)     . Neuromuscular disorder     rt carpal tunnel    Past Surgical History  Procedure Laterality Date  . Cholecystectomy    . Right ankle surgery    . Cardiac catheterization    . Coronary angioplasty  2006    stent  . Colonoscopy    . Carpal tunnel release  11/29/2011    Procedure: CARPAL TUNNEL RELEASE;  Surgeon: Cammie Sickle., MD;  Location: [facility];  Service: Orthopedics;  Laterality: Right;    Patient Active Problem List   Diagnosis Date Noted  . Carotid bruit     Priority: High  . Warfarin anticoagulation     Priority: High  . Pulmonary hypertension 04/19/2011    Priority: High  . Pulmonary nodules 10/07/2013  . Encounter for therapeutic drug monitoring 09/22/2013  . DOE (dyspnea on exertion) 05/26/2012  . Psoriasis   . CAD (coronary artery disease)   . Atrial fibrillation 11/16/2010  . GERD 04/17/2010  . COLONIC POLYPS, HX OF 04/17/2010  . DYSLIPIDEMIA 04/17/2009  . Overweight 04/17/2009  . HYPERTENSION 04/17/2009      Current Outpatient Prescriptions  Medication Sig Dispense Refill  . ALPRAZolam (XANAX) 0.5 MG tablet Take 0.75 mg by mouth at bedtime as needed for sleep.     [name] aspirin 81 MG tablet Take 81 mg by mouth daily.      [name] atorvastatin (LIPITOR) 80  MG tablet TAKE 1/2 TABLET BY MOUTH DAILY 45 tablet 0  . AZOPT 1 % ophthalmic suspension Place 1 drop into both eyes 3 (three) times daily.     . Coenzyme Q10 (COQ10) 50 MG CAPS Take 2 tablets by mouth daily.      . fexofenadine (ALLEGRA) 180 MG tablet Take 180 mg by mouth as needed for allergies.     . furosemide (LASIX) 20 MG tablet Take 20 mg by mouth daily.    [name] HYDROcodone-acetaminophen (VICODIN) 5-500 MG per tablet Take 0.5 tablets by mouth every 4 (four) hours as needed for pain.     [name] latanoprost (XALATAN) 0.005 % ophthalmic solution Place 1 drop into the right eye at bedtime.     [name] losartan (COZAAR) 100 MG tablet TAKE 1 TABLET BY MOUTH EVERY DAY 30 tablet 6  . NITROSTAT 0.4 MG SL  tablet PLACE 1 TABLET UNDER THE TONGUE EVERY 5 MINUTES AS NEEDED. 100 tablet 3  . potassium chloride SA (K-DUR,KLOR-CON) 20 MEQ tablet Take 20 mEq by mouth daily.     [name] warfarin (COUMADIN) 5 MG tablet Take 1 tablet daily or as directed 45 tablet 6   No current facility-administered medications for this visit.    Allergies:   Review of patient's allergies indicates no known allergies.    Social History:  The patient  reports that she quit smoking about 28 years ago. Her smoking use included Cigarettes. She has a .15 pack-year smoking history. She has never used smokeless tobacco. She reports that she does not drink alcohol or use illicit drugs.   Family History:  The patient's family history includes COPD in her brother; Coronary artery disease in her father; Emphysema in her brother and sister; Heart attack in her brother and mother; Heart failure in her brother; Stroke in her father.    ROS:  Please see the history of present illness.    Patient denies fever, chills, headache, sweats, rash, change in vision, change in hearing, chest pain, cough, nausea or vomiting, urinary symptoms. All other systems are reviewed and are negative.   PHYSICAL EXAM: VS:  BP 136/84 mmHg  Pulse 69  Ht 5\' 1"  (1.549 m)  Wt 231 lb (104.781 kg)  BMI 43.67 kg/m2 , Patient is significantly overweight. She is sitting in a wheelchair today. She is here with her husband as always. Head is atraumatic. Sclera and conjunctiva are normal. There is no jugular venous distention. Lungs are clear. Respiratory effort is nonlabored. Cardiac exam reveals an S1 and S2. The rhythm is irregularly irregular. The rate is controlled. Abdomen is protuberant but soft. She has support hose on her legs. There are no skin rashes. Neurologic is grossly intact.  EKG:   EKG is done today and reviewed by me. She has old decreased R-wave in V1 and V2. There is atrial fibrillation with a controlled rate.   Recent Labs: No results found for  requested labs within last 365 days.    Lipid Panel    Component Value Date/Time   CHOL 101 05/18/2012 1131   TRIG 117.0 05/18/2012 1131   HDL 41.90 05/18/2012 1131   CHOLHDL 2 05/18/2012 1131   VLDL 23.4 05/18/2012 1131   LDLCALC 36 05/18/2012 1131      Wt Readings from Last 3 Encounters:  11/21/14 231 lb (104.781 kg)  10/07/13 245 lb 6.4 oz (111.313 kg)  09/20/13 244 lb (110.678 kg)      Current medicines are reviewed  The patient understands her  medications.     ASSESSMENT AND PLAN:

## 2014-11-21 NOTE — Assessment & Plan Note (Signed)
Blood pressures controlled. No change in therapy. 

## 2014-11-21 NOTE — Assessment & Plan Note (Signed)
Patient remained stable on Coumadin for her atrial fibrillation.

## 2014-11-21 NOTE — Patient Instructions (Signed)
Your physician recommends that you continue on your current medications as directed. Please refer to the Current Medication list given to you today.  Your physician wants you to follow-up in: February, 2017.  You will receive a reminder letter in the mail two months in advance. If you don't receive a letter, please call our office to schedule the follow-up appointment.

## 2014-11-21 NOTE — Assessment & Plan Note (Signed)
The patient is on guideline directed therapy. No further workup.

## 2014-11-21 NOTE — Assessment & Plan Note (Signed)
The patient has known significant pulmonary hypertension. This has been assessed fully by the pulmonary team. Clinically she is stable at this time.

## 2014-11-21 NOTE — Assessment & Plan Note (Signed)
The pulmonary nodules have been stable by follow-up CT scan.

## 2014-11-21 NOTE — Assessment & Plan Note (Signed)
There is a history of coronary disease with a bare metal stent in December, 2006. Nuclear in 2012 revealed no marked ischemia. No further workup at this time.

## 2014-11-22 ENCOUNTER — Other Ambulatory Visit: Payer: Self-pay | Admitting: Cardiology

## 2014-12-19 ENCOUNTER — Other Ambulatory Visit: Payer: Self-pay | Admitting: Cardiology

## 2014-12-19 DIAGNOSIS — M810 Age-related osteoporosis without current pathological fracture: Secondary | ICD-10-CM | POA: Diagnosis not present

## 2014-12-19 DIAGNOSIS — J0100 Acute maxillary sinusitis, unspecified: Secondary | ICD-10-CM | POA: Diagnosis not present

## 2014-12-19 DIAGNOSIS — Z6841 Body Mass Index (BMI) 40.0 and over, adult: Secondary | ICD-10-CM | POA: Diagnosis not present

## 2014-12-19 DIAGNOSIS — I4891 Unspecified atrial fibrillation: Secondary | ICD-10-CM | POA: Diagnosis not present

## 2014-12-19 DIAGNOSIS — G894 Chronic pain syndrome: Secondary | ICD-10-CM | POA: Diagnosis not present

## 2014-12-21 ENCOUNTER — Ambulatory Visit (INDEPENDENT_AMBULATORY_CARE_PROVIDER_SITE_OTHER): Payer: Medicare Other | Admitting: *Deleted

## 2014-12-21 DIAGNOSIS — Z7901 Long term (current) use of anticoagulants: Secondary | ICD-10-CM | POA: Diagnosis not present

## 2014-12-21 DIAGNOSIS — Z5181 Encounter for therapeutic drug level monitoring: Secondary | ICD-10-CM | POA: Diagnosis not present

## 2014-12-21 DIAGNOSIS — Z8679 Personal history of other diseases of the circulatory system: Secondary | ICD-10-CM | POA: Diagnosis not present

## 2014-12-21 DIAGNOSIS — I4891 Unspecified atrial fibrillation: Secondary | ICD-10-CM | POA: Diagnosis not present

## 2014-12-21 LAB — POCT INR: INR: 2.6

## 2014-12-30 ENCOUNTER — Other Ambulatory Visit: Payer: Self-pay | Admitting: Cardiology

## 2015-02-01 ENCOUNTER — Ambulatory Visit (INDEPENDENT_AMBULATORY_CARE_PROVIDER_SITE_OTHER): Payer: Medicare Other | Admitting: *Deleted

## 2015-02-01 DIAGNOSIS — I4891 Unspecified atrial fibrillation: Secondary | ICD-10-CM | POA: Diagnosis not present

## 2015-02-01 DIAGNOSIS — Z8679 Personal history of other diseases of the circulatory system: Secondary | ICD-10-CM

## 2015-02-01 DIAGNOSIS — Z7901 Long term (current) use of anticoagulants: Secondary | ICD-10-CM

## 2015-02-01 DIAGNOSIS — Z5181 Encounter for therapeutic drug level monitoring: Secondary | ICD-10-CM

## 2015-02-01 LAB — POCT INR: INR: 2.1

## 2015-03-15 ENCOUNTER — Ambulatory Visit (INDEPENDENT_AMBULATORY_CARE_PROVIDER_SITE_OTHER): Payer: Medicare Other | Admitting: *Deleted

## 2015-03-15 ENCOUNTER — Telehealth: Payer: Self-pay | Admitting: *Deleted

## 2015-03-15 DIAGNOSIS — Z7901 Long term (current) use of anticoagulants: Secondary | ICD-10-CM | POA: Diagnosis not present

## 2015-03-15 DIAGNOSIS — Z5181 Encounter for therapeutic drug level monitoring: Secondary | ICD-10-CM | POA: Diagnosis not present

## 2015-03-15 DIAGNOSIS — I4891 Unspecified atrial fibrillation: Secondary | ICD-10-CM | POA: Diagnosis not present

## 2015-03-15 DIAGNOSIS — Z8679 Personal history of other diseases of the circulatory system: Secondary | ICD-10-CM | POA: Diagnosis not present

## 2015-03-15 LAB — POCT INR: INR: 2

## 2015-03-15 NOTE — Telephone Encounter (Signed)
Pt called stating went to see her PCP today and was placed on Cipro 500 mg tab 1 bid for 7 days for UTI and was instructed by her PCP to take her coumadin every other day while on Cipro. This nurse spoke with Jinny Blossom Pharmacist at [facility] and she agreed that pt needs to continue her coumadin as ordered and take Cipro as ordered and have her have INR rechecked on Monday August 1st 2016 . This nurse  spoke with the pt and gave the instruction to continue her coumadin as instructed and to take cipro as ordered and made appt for her to have INR rechecked on Monday August 1st and to eat extra serving of greens while on the Cipro and she states understanding.

## 2015-03-20 ENCOUNTER — Ambulatory Visit (INDEPENDENT_AMBULATORY_CARE_PROVIDER_SITE_OTHER): Payer: Medicare Other | Admitting: *Deleted

## 2015-03-20 DIAGNOSIS — Z8679 Personal history of other diseases of the circulatory system: Secondary | ICD-10-CM | POA: Diagnosis not present

## 2015-03-20 DIAGNOSIS — I4891 Unspecified atrial fibrillation: Secondary | ICD-10-CM

## 2015-03-20 DIAGNOSIS — Z5181 Encounter for therapeutic drug level monitoring: Secondary | ICD-10-CM

## 2015-03-20 DIAGNOSIS — Z7901 Long term (current) use of anticoagulants: Secondary | ICD-10-CM | POA: Diagnosis not present

## 2015-03-20 LAB — POCT INR: INR: 2.3

## 2015-03-21 ENCOUNTER — Encounter: Payer: Self-pay | Admitting: Internal Medicine

## 2015-03-24 DIAGNOSIS — G894 Chronic pain syndrome: Secondary | ICD-10-CM | POA: Diagnosis not present

## 2015-03-24 DIAGNOSIS — Z6841 Body Mass Index (BMI) 40.0 and over, adult: Secondary | ICD-10-CM | POA: Diagnosis not present

## 2015-03-24 DIAGNOSIS — R300 Dysuria: Secondary | ICD-10-CM | POA: Diagnosis not present

## 2015-03-24 DIAGNOSIS — Z1389 Encounter for screening for other disorder: Secondary | ICD-10-CM | POA: Diagnosis not present

## 2015-03-24 DIAGNOSIS — F419 Anxiety disorder, unspecified: Secondary | ICD-10-CM | POA: Diagnosis not present

## 2015-04-11 ENCOUNTER — Other Ambulatory Visit (HOSPITAL_COMMUNITY): Payer: Self-pay | Admitting: Internal Medicine

## 2015-04-11 DIAGNOSIS — M810 Age-related osteoporosis without current pathological fracture: Secondary | ICD-10-CM

## 2015-04-14 DIAGNOSIS — H4011X3 Primary open-angle glaucoma, severe stage: Secondary | ICD-10-CM | POA: Diagnosis not present

## 2015-04-17 ENCOUNTER — Ambulatory Visit (HOSPITAL_COMMUNITY)
Admission: RE | Admit: 2015-04-17 | Discharge: 2015-04-17 | Disposition: A | Discharge status: Home / Self Care | Payer: Medicare Other | Source: Ambulatory Visit | Attending: Internal Medicine | Admitting: Internal Medicine

## 2015-04-17 DIAGNOSIS — M810 Age-related osteoporosis without current pathological fracture: Secondary | ICD-10-CM | POA: Diagnosis present

## 2015-04-17 DIAGNOSIS — Z780 Asymptomatic menopausal state: Secondary | ICD-10-CM | POA: Insufficient documentation

## 2015-04-20 DIAGNOSIS — Z6841 Body Mass Index (BMI) 40.0 and over, adult: Secondary | ICD-10-CM | POA: Diagnosis not present

## 2015-04-20 DIAGNOSIS — J019 Acute sinusitis, unspecified: Secondary | ICD-10-CM | POA: Diagnosis not present

## 2015-04-20 DIAGNOSIS — J301 Allergic rhinitis due to pollen: Secondary | ICD-10-CM | POA: Diagnosis not present

## 2015-04-26 ENCOUNTER — Ambulatory Visit (INDEPENDENT_AMBULATORY_CARE_PROVIDER_SITE_OTHER): Payer: Medicare Other | Admitting: *Deleted

## 2015-04-26 DIAGNOSIS — Z8679 Personal history of other diseases of the circulatory system: Secondary | ICD-10-CM

## 2015-04-26 DIAGNOSIS — Z5181 Encounter for therapeutic drug level monitoring: Secondary | ICD-10-CM

## 2015-04-26 DIAGNOSIS — I4891 Unspecified atrial fibrillation: Secondary | ICD-10-CM | POA: Diagnosis not present

## 2015-04-26 DIAGNOSIS — Z7901 Long term (current) use of anticoagulants: Secondary | ICD-10-CM | POA: Diagnosis not present

## 2015-04-26 LAB — POCT INR: INR: 2.1

## 2015-04-27 ENCOUNTER — Other Ambulatory Visit: Payer: Self-pay | Admitting: Cardiology

## 2015-05-05 DIAGNOSIS — J019 Acute sinusitis, unspecified: Secondary | ICD-10-CM | POA: Diagnosis not present

## 2015-05-05 DIAGNOSIS — I4891 Unspecified atrial fibrillation: Secondary | ICD-10-CM | POA: Diagnosis not present

## 2015-05-05 DIAGNOSIS — J209 Acute bronchitis, unspecified: Secondary | ICD-10-CM | POA: Diagnosis not present

## 2015-05-05 DIAGNOSIS — J9801 Acute bronchospasm: Secondary | ICD-10-CM | POA: Diagnosis not present

## 2015-05-05 DIAGNOSIS — F419 Anxiety disorder, unspecified: Secondary | ICD-10-CM | POA: Diagnosis not present

## 2015-05-05 DIAGNOSIS — Z1389 Encounter for screening for other disorder: Secondary | ICD-10-CM | POA: Diagnosis not present

## 2015-05-29 ENCOUNTER — Ambulatory Visit (HOSPITAL_COMMUNITY)
Admission: RE | Admit: 2015-05-29 | Discharge: 2015-05-29 | Disposition: A | Discharge status: Home / Self Care | Payer: Medicare Other | Source: Ambulatory Visit | Attending: Family Medicine | Admitting: Family Medicine

## 2015-05-29 ENCOUNTER — Other Ambulatory Visit (HOSPITAL_COMMUNITY): Payer: Self-pay | Admitting: Family Medicine

## 2015-05-29 DIAGNOSIS — Z6839 Body mass index (BMI) 39.0-39.9, adult: Secondary | ICD-10-CM | POA: Diagnosis not present

## 2015-05-29 DIAGNOSIS — R05 Cough: Secondary | ICD-10-CM | POA: Diagnosis present

## 2015-05-29 DIAGNOSIS — R0982 Postnasal drip: Secondary | ICD-10-CM

## 2015-05-29 DIAGNOSIS — J302 Other seasonal allergic rhinitis: Secondary | ICD-10-CM

## 2015-05-29 DIAGNOSIS — R0602 Shortness of breath: Secondary | ICD-10-CM | POA: Diagnosis not present

## 2015-05-29 DIAGNOSIS — Z1389 Encounter for screening for other disorder: Secondary | ICD-10-CM | POA: Diagnosis not present

## 2015-05-29 DIAGNOSIS — R053 Chronic cough: Secondary | ICD-10-CM

## 2015-05-29 DIAGNOSIS — R079 Chest pain, unspecified: Secondary | ICD-10-CM | POA: Diagnosis not present

## 2015-05-29 DIAGNOSIS — J449 Chronic obstructive pulmonary disease, unspecified: Secondary | ICD-10-CM | POA: Insufficient documentation

## 2015-06-05 DIAGNOSIS — T50905S Adverse effect of unspecified drugs, medicaments and biological substances, sequela: Secondary | ICD-10-CM | POA: Diagnosis not present

## 2015-06-05 DIAGNOSIS — Z1389 Encounter for screening for other disorder: Secondary | ICD-10-CM | POA: Diagnosis not present

## 2015-06-05 DIAGNOSIS — Z6839 Body mass index (BMI) 39.0-39.9, adult: Secondary | ICD-10-CM | POA: Diagnosis not present

## 2015-06-05 DIAGNOSIS — J449 Chronic obstructive pulmonary disease, unspecified: Secondary | ICD-10-CM | POA: Diagnosis not present

## 2015-06-05 DIAGNOSIS — I4891 Unspecified atrial fibrillation: Secondary | ICD-10-CM | POA: Diagnosis not present

## 2015-06-05 DIAGNOSIS — I10 Essential (primary) hypertension: Secondary | ICD-10-CM | POA: Diagnosis not present

## 2015-06-13 ENCOUNTER — Encounter: Payer: Self-pay | Admitting: Cardiology

## 2015-06-13 ENCOUNTER — Ambulatory Visit (INDEPENDENT_AMBULATORY_CARE_PROVIDER_SITE_OTHER): Payer: Medicare Other | Admitting: Cardiology

## 2015-06-13 VITALS — BP 122/70 | HR 72 | Ht 61.0 in | Wt 225.4 lb

## 2015-06-13 DIAGNOSIS — R918 Other nonspecific abnormal finding of lung field: Secondary | ICD-10-CM | POA: Diagnosis not present

## 2015-06-13 DIAGNOSIS — I2510 Atherosclerotic heart disease of native coronary artery without angina pectoris: Secondary | ICD-10-CM

## 2015-06-13 DIAGNOSIS — Z7901 Long term (current) use of anticoagulants: Secondary | ICD-10-CM

## 2015-06-13 DIAGNOSIS — E785 Hyperlipidemia, unspecified: Secondary | ICD-10-CM

## 2015-06-13 DIAGNOSIS — I4891 Unspecified atrial fibrillation: Secondary | ICD-10-CM | POA: Diagnosis not present

## 2015-06-13 DIAGNOSIS — I272 Other secondary pulmonary hypertension: Secondary | ICD-10-CM | POA: Diagnosis not present

## 2015-06-13 DIAGNOSIS — I10 Essential (primary) hypertension: Secondary | ICD-10-CM | POA: Diagnosis not present

## 2015-06-13 DIAGNOSIS — I482 Chronic atrial fibrillation, unspecified: Secondary | ICD-10-CM

## 2015-06-13 NOTE — Patient Instructions (Addendum)
Medication Instructions:  Your physician recommends that you continue on your current medications as directed. Please refer to the Current Medication list given to you today.   Labwork: None ordered  Testing/Procedures: Non-Cardiac CT scanning, (CAT scanning), is a noninvasive, special x-ray that produces cross-sectional images of the body using x-rays and a computer. CT scans help physicians diagnose and treat medical conditions. For some CT exams, a contrast material is used to enhance visibility in the area of the body being studied. CT scans provide greater clarity and reveal more details than regular x-ray exams.    Follow-Up: Your physician recommends that you schedule a follow-up appointment in: January with Dr. Domenic Polite [facility] or Linna Hoff)   Any Other Special Instructions Will Be Listed Below (If Applicable).  A referral has been placed for you to see either Dr. Lamonte Sakai or Dr. Lake Bells at [facility].  Someone from their office will call you with that appointment.    If you need a refill on your cardiac medications before your next appointment, please call your pharmacy.

## 2015-06-13 NOTE — Progress Notes (Signed)
Cardiology Office Note   Date:  06/13/2015   ID:  WALTER MIN, DOB 1934-11-15, MRN [id number]  PCP:  Glo Herring., MD  Cardiologist:  Dr. Ron Parker    Chief Complaint  Patient presents with  . Atrial Fibrillation    chronic a fib , CAD      History of Present Illness: [name] is a 80 y.o. female who presents for PAF and CAD.    Pt is on coumadin for her chronic atrial fib.  Last seen by Dr. Ron Parker in April this year.  Hx of CAD with stent  In 2006 prox LAD.   She has pul. HTN that is significant.  She wears oxygen at night.  Pulmonary nodules followed by Pul. With last CT in Feb. 2016 and one is scheduled for 09/2015.    Today she has no chest pain, no significant SOB.  But she has had cough and been through several cough meds. she would like her CT scan in Jan to be done now.  She is very concerned she may have cancer.  Her recent CXR was without edema, or infiltrate.  She is on home oxygen at night only.  She needs follow up with Pulmonary -Dr. Gwenette Greet has moved.      She had one fall since last last visit, she bent too far over and kept going.  She has no dizziness or lightheadedness.   Coumadin followed by our office in [place].  Past Medical History  Diagnosis Date  . Hypertension   . Dyslipidemia   . Atrial fibrillation ([place])     chronic since 2011  . CAD (coronary artery disease)     Bare-metal stent December, 2006 /  Nuclear, October 11, 2010, question of small scar or ischemia in the inferolateral wall versus attenuation  . Degenerative arthritis of right knee   . Morbid obesity ([place])   . Psoriasis   . Carotid bruit     doppler, 2012, no significant abnormality  . Ejection fraction     EF 55-60%, echo, August, 2012  . Mitral regurgitation     Mild, echo, August, 2012  . Right ventricular dysfunction     Mild dilatation of the right ventricle, mild decreased right ventricular function, echo, August, 2012  . Tricuspid regurgitation    Severe, echo, August of 2012  . Pulmonary hypertension (HCC)     moderate RHC  . Warfarin anticoagulation   . Partial, of this data file.   [name] Shortness of breath   . GERD (gastroesophageal reflux disease)   . Neuromuscular disorder ([place])     rt carpal tunnel    Past Surgical History  Procedure Laterality Date  . Cholecystectomy    . Right ankle surgery    . Cardiac catheterization    . Coronary angioplasty  2006    stent  . Colonoscopy    . Carpal tunnel release  11/29/2011    Procedure: CARPAL TUNNEL RELEASE;  Surgeon: Cammie Sickle., MD;  Location: [facility];  Service: Orthopedics;  Laterality: Right;     Current Outpatient Prescriptions  Medication Sig Dispense Refill  . albuterol (PROVENTIL) 2 MG tablet Take 2 mg by mouth 3 (three) times daily.     [name] ALPRAZolam (XANAX) 0.5 MG tablet Take 0.75 mg by mouth at bedtime as needed for sleep.     [name] aspirin 81 MG tablet Take 81 mg by mouth daily.      [name] atorvastatin (LIPITOR) 80  MG tablet TAKE 1/2 TABLET BY MOUTH DAILY 45 tablet 2  . AZOPT 1 % ophthalmic suspension Place 1 drop into both eyes 3 (three) times daily.     . benzonatate (TESSALON) 100 MG capsule Take 100 mg by mouth 3 (three) times daily as needed for cough.    . brimonidine (ALPHAGAN) 0.2 % ophthalmic solution Place 1 drop into both eyes 2 (two) times daily.     . cetirizine (ZYRTEC) 10 MG tablet Take 10 mg by mouth daily.    . Coenzyme Q10 (COQ10) 50 MG CAPS Take 2 tablets by mouth daily.      [name] doxycycline (VIBRAMYCIN) 100 MG capsule Take 100 mg by mouth 2 (two) times daily.    . fluticasone (FLONASE) 50 MCG/ACT nasal spray Place 2 sprays into both nostrils daily.     . furosemide (LASIX) 20 MG tablet Take 20 mg by mouth daily.    [name] HYDROcodone-acetaminophen (NORCO/VICODIN) 5-325 MG tablet Take 1 tablet by mouth every 4 (four) hours as needed.     . latanoprost (XALATAN) 0.005 % ophthalmic solution Place 1 drop into the right eye at bedtime.       . lidocaine (XYLOCAINE) 5 % ointment Apply 1 application topically daily as needed (boby pain).     [name] losartan (COZAAR) 100 MG tablet TAKE 1 TABLET BY MOUTH EVERY DAY 30 tablet 6  . methylPREDNISolone (MEDROL DOSEPAK) 4 MG TBPK tablet Take 4 mg by mouth as directed.     . nitroGLYCERIN (NITROSTAT) 0.4 MG SL tablet Place 0.4 mg under the tongue every 5 (five) minutes as needed for chest pain (x 3 tablets daily).    . potassium chloride SA (K-DUR,KLOR-CON) 20 MEQ tablet Take 20 mEq by mouth daily.     . VENTOLIN HFA 108 (90 BASE) MCG/ACT inhaler     . warfarin (COUMADIN) 5 MG tablet Take 1 tablet daily except 1 1/2 tablets on Wednesdays and Saturdays 45 tablet 6   No current facility-administered medications for this visit.    Allergies:   Review of patient's allergies indicates no known allergies.    Social History:  The patient  reports that she quit smoking about 28 years ago. Her smoking use included Cigarettes. She has a .15 pack-year smoking history. She has never used smokeless tobacco. She reports that she does not drink alcohol or use illicit drugs.   Family History:  The patient's family history includes COPD in her brother; Coronary artery disease in her father; Emphysema in her brother and sister; Heart attack in her brother and mother; Heart failure in her brother; Stroke in her father.    ROS:  General:no colds or fevers,  weight decreased Skin:no rashes or ulcers, some bruising on chest, unsure what she did. HEENT:no blurred vision, + congestion continued cough despite treatment, white mucus.  CV:see HPI PUL:see HPI GI:no diarrhea constipation or melena, no indigestion GU:no hematuria, no dysuria MS:no joint pain, no claudication Neuro:no syncope, no lightheadedness Endo:no diabetes, no thyroid disease  Wt Readings from Last 3 Encounters:  06/13/15 225 lb 6.4 oz (102.241 kg)  11/21/14 231 lb (104.781 kg)  10/07/13 245 lb 6.4 oz (111.313 kg)     PHYSICAL EXAM: VS:   BP 122/70 mmHg  Pulse 72  Ht 5\' 1"  (1.549 m)  Wt 225 lb 6.4 oz (102.241 kg)  BMI 42.61 kg/m2 , BMI Body mass index is 42.61 kg/(m^2). General:Pleasant affect, NAD Skin:Warm and dry, brisk capillary refill HEENT:normocephalic, sclera clear, mucus membranes moist  Neck:supple, no JVD, no bruits  Heart:irreg irreg without murmur, gallup, rub or click Lungs:clear without rales, rhonchi, or wheezes HWE:XHBZJ, soft, non tender, + BS, do not palpate liver spleen or masses Ext:tr lower ext edema, 1+ pedal pulses, 2+ radial pulses Neuro:alert and oriented X 3, MAE, follows commands, + facial symmetry    EKG:  EKG is ordered today. The ekg ordered today demonstrates  afib with no acute changes except HR slower down to 48.  SP02 at rest 97% with HR 56  With ambulation HR up to 81 and SP02 to 90%.      Recent Labs: No results found for requested labs within last 365 days.    Lipid Panel    Component Value Date/Time   CHOL 101 05/18/2012 1131   TRIG 117.0 05/18/2012 1131   HDL 41.90 05/18/2012 1131   CHOLHDL 2 05/18/2012 1131   VLDL 23.4 05/18/2012 1131   LDLCALC 36 05/18/2012 1131       Other studies Reviewed: Additional studies/ records that were reviewed today include:  Echo: 05/2012 .Study Conclusions - Left ventricle: The cavity size was normal. Wall thickness was normal. Systolic function was normal. The estimated ejection fraction was in the range of 55% to 60%. - Mitral valve: Mild regurgitation. - Left atrium: The atrium was severely dilated. - Right atrium: The atrium was severely dilated. - Pulmonary arteries: PA peak pressure: 6mm Hg (S).   ASSESSMENT AND PLAN:  1.  Chronic a fib with CHA2DS2VAsc score 5 on coumadin followed by our office.  Dr. Ron Parker has retired, discussed with pt and her husband and Dr. Ron Parker had mentioned follow up with Dr. Domenic Polite,  They just do not want any tests in [place].  With her bradycardia will have her seen in Jan with Dr.  Domenic Polite.  2. Bradycardia- with ambulation HR up to 80s,  She has no dizziness unless moves quickly.  Discussed possible need for PPM in future and symptoms of bradycardia.  she is on no rate slowing meds.'  3. CAD hx or remote stent. No chest pain or symptoms of angina.   4. HTN controlled  5. Hyperlipidemia   Followed by PCP, continue statin.  6.  Cough she is on ARB may be cause of cough but also hx of pulmonary nodules will do her CT chest scan wo contrast now, she prefers this.    7. Pul HTN follow up with pulmonary - Dr. Gwenette Greet no longer with the practice.  Also for pulmonary nodule.    Current medicines are reviewed with the patient today.  The patient Has no concerns regarding medicines.  The following changes have been made:  See above Labs/ tests ordered today include:see above  Disposition:   FU:  see above  Lennie Muckle, NP  06/13/2015 12:01 PM    [facility] [place], [place], [place], [place] Phone: [phone]; Fax: [phone]

## 2015-06-14 ENCOUNTER — Ambulatory Visit (INDEPENDENT_AMBULATORY_CARE_PROVIDER_SITE_OTHER): Payer: Medicare Other | Admitting: *Deleted

## 2015-06-14 DIAGNOSIS — Z7901 Long term (current) use of anticoagulants: Secondary | ICD-10-CM

## 2015-06-14 DIAGNOSIS — Z5181 Encounter for therapeutic drug level monitoring: Secondary | ICD-10-CM

## 2015-06-14 DIAGNOSIS — I4891 Unspecified atrial fibrillation: Secondary | ICD-10-CM | POA: Diagnosis not present

## 2015-06-14 DIAGNOSIS — Z8679 Personal history of other diseases of the circulatory system: Secondary | ICD-10-CM | POA: Diagnosis not present

## 2015-06-14 LAB — POCT INR: INR: 3.6

## 2015-07-05 ENCOUNTER — Ambulatory Visit (INDEPENDENT_AMBULATORY_CARE_PROVIDER_SITE_OTHER): Payer: Medicare Other | Admitting: *Deleted

## 2015-07-05 DIAGNOSIS — Z8679 Personal history of other diseases of the circulatory system: Secondary | ICD-10-CM | POA: Diagnosis not present

## 2015-07-05 DIAGNOSIS — I4891 Unspecified atrial fibrillation: Secondary | ICD-10-CM

## 2015-07-05 DIAGNOSIS — Z5181 Encounter for therapeutic drug level monitoring: Secondary | ICD-10-CM

## 2015-07-05 DIAGNOSIS — Z7901 Long term (current) use of anticoagulants: Secondary | ICD-10-CM

## 2015-07-05 LAB — POCT INR: INR: 3.1

## 2015-07-06 DIAGNOSIS — N3941 Urge incontinence: Secondary | ICD-10-CM | POA: Diagnosis not present

## 2015-07-06 DIAGNOSIS — Z23 Encounter for immunization: Secondary | ICD-10-CM | POA: Diagnosis not present

## 2015-07-06 DIAGNOSIS — N342 Other urethritis: Secondary | ICD-10-CM | POA: Diagnosis not present

## 2015-07-06 DIAGNOSIS — Z6839 Body mass index (BMI) 39.0-39.9, adult: Secondary | ICD-10-CM | POA: Diagnosis not present

## 2015-07-07 ENCOUNTER — Ambulatory Visit (INDEPENDENT_AMBULATORY_CARE_PROVIDER_SITE_OTHER): Payer: Medicare Other | Admitting: Emergency Medicine

## 2015-07-07 ENCOUNTER — Encounter: Payer: Self-pay | Admitting: Emergency Medicine

## 2015-07-07 VITALS — BP 126/82 | HR 61 | Ht 63.0 in | Wt 227.0 lb

## 2015-07-07 DIAGNOSIS — R918 Other nonspecific abnormal finding of lung field: Secondary | ICD-10-CM | POA: Diagnosis not present

## 2015-07-07 DIAGNOSIS — I272 Other secondary pulmonary hypertension: Secondary | ICD-10-CM

## 2015-07-07 DIAGNOSIS — I2510 Atherosclerotic heart disease of native coronary artery without angina pectoris: Secondary | ICD-10-CM

## 2015-07-07 NOTE — Assessment & Plan Note (Signed)
Multifactorial, with possible contributions of chronic thromboembolic disease, nocturnal hypoxemia. Consider also contribution of autoimmune disease given her psoriasis. He is not on any targeted therapy at this time. Efficacy of this is not clear to me given the pulmonary venous component and her age. She will likely need a repeat echocardiogram to look for interval change in her pulmonary pressures

## 2015-07-07 NOTE — Patient Instructions (Signed)
Please have your CT scan of the chest in February as planned Follow with Dr. Domenic Polite. He will likely repeat echocardiogram to assess you pulmonary hypertension Follow with Dr. Lamonte Sakai in February 2017 to review your study results

## 2015-07-07 NOTE — Assessment & Plan Note (Signed)
Pulmonary nodules principally in the left upper lobe that were stable on serial CT scans of the chest and negative on PET scan. Her final CT to look for stability is in February 2017. We will follow-up and review after his performed

## 2015-07-07 NOTE — Progress Notes (Signed)
Subjective:    Patient ID: [name], female    DOB: 08/29/1934, 80 y.o.   MRN: [id number]  HPI 81 year old woman with a very remote and small tobacco history, history of hypertension, atrial fibrillation, coronary artery disease, psoriasis. She has secondary multifactorial pulmonary hypertension that has been presumed related to obesity with chronic hypoxemia, left-sided heart disease and presumed chronic thromboembolic disease. As noted she does also have a history of autoimmune process, psoriasis.  She is not on any targeted [place] therapy. Last seen by Dr Gwenette Greet here in February 2015. She had a CT scan of the chest 10/05/13 that showed pulmonary nodules, the 2 most dominant were in the left upper lobe, and a subsequent PET scan on 04/11/14 showed that these were not hypermetabolic. I have personally reviewed the scans as well as a repeat CT scan done on 09/27/14 that showed no interval change. She is due for a final scan to confirm stability in February 2017. She underwent a polysomnogram in November 2013 with an AHI of 4.1 and RDI of 5.9.   She reports no SOB, is able to exert. Is able to do housework. She recently had a URI and cough, took 8 weeks to get over the cough. Was treated with pred and abx.    Review of Systems  Constitutional: Negative for fever and unexpected weight change.  HENT: Negative for congestion, dental problem, ear pain, nosebleeds, postnasal drip, rhinorrhea, sinus pressure, sneezing, sore throat and trouble swallowing.   Eyes: Negative for redness and itching.  Respiratory: Negative for cough, chest tightness, shortness of breath and wheezing.   Cardiovascular: Negative for palpitations and leg swelling.  Gastrointestinal: Negative for nausea and vomiting.  Genitourinary: Negative for dysuria.  Musculoskeletal: Negative for joint swelling.  Skin: Negative for rash.  Neurological: Negative for headaches.  Hematological: Does not bruise/bleed easily.    Psychiatric/Behavioral: Negative for dysphoric mood. The patient is not nervous/anxious.    Past Medical History  Diagnosis Date  . Hypertension   . Dyslipidemia   . Atrial fibrillation ([place])     chronic since 2011  . CAD (coronary artery disease)     Bare-metal stent December, 2006 /  Nuclear, October 11, 2010, question of small scar or ischemia in the inferolateral wall versus attenuation  . Degenerative arthritis of right knee   . Morbid obesity ([place])   . Psoriasis   . Carotid bruit     doppler, 2012, no significant abnormality  . Ejection fraction     EF 55-60%, echo, August, 2012  . Mitral regurgitation     Mild, echo, August, 2012  . Right ventricular dysfunction     Mild dilatation of the right ventricle, mild decreased right ventricular function, echo, August, 2012  . Tricuspid regurgitation     Severe, echo, August of 2012  . Pulmonary hypertension (HCC)     moderate RHC  . Warfarin anticoagulation   . Partial, of this data file.   [name] Shortness of breath   . GERD (gastroesophageal reflux disease)   . Neuromuscular disorder (HCC)     rt carpal tunnel     Family History  Problem Relation Age of Onset  . Heart attack Mother   . Coronary artery disease Father   . Stroke Father   . Heart attack Brother   . Heart failure Brother   . COPD Brother   . Emphysema Sister     smoked  . Emphysema Brother     smoked  Social History   Social History  . Marital Status: Married    Spouse Name: N/A  . Number of Children: 2  . Years of Education: N/A   Occupational History  . retired    Social History Main Topics  . Smoking status: Former Smoker -- 0.30 packs/day for .5 years    Types: Cigarettes    Quit date: 08/19/1986  . Smokeless tobacco: Never Used     Comment: pt only smoked for only a few months.  . Alcohol Use: No  . Drug Use: No  . Sexual Activity: Not on file   Other Topics Concern  . Not on file   Social History Narrative     No Known  Allergies   Outpatient Prescriptions Prior to Visit  Medication Sig Dispense Refill  . ALPRAZolam (XANAX) 0.5 MG tablet Take 0.75 mg by mouth at bedtime as needed for sleep.     [name] aspirin 81 MG tablet Take 81 mg by mouth daily.      [name] atorvastatin (LIPITOR) 80 MG tablet TAKE 1/2 TABLET BY MOUTH DAILY 45 tablet 2  . AZOPT 1 % ophthalmic suspension Place 1 drop into both eyes 3 (three) times daily.     . brimonidine (ALPHAGAN) 0.2 % ophthalmic solution Place 1 drop into both eyes 2 (two) times daily.     . cetirizine (ZYRTEC) 10 MG tablet Take 10 mg by mouth daily.    . Coenzyme Q10 (COQ10) 50 MG CAPS Take 2 tablets by mouth daily.      . fluticasone (FLONASE) 50 MCG/ACT nasal spray Place 2 sprays into both nostrils daily.     . furosemide (LASIX) 20 MG tablet Take 20 mg by mouth daily.    [name] HYDROcodone-acetaminophen (NORCO/VICODIN) 5-325 MG tablet Take 1 tablet by mouth every 4 (four) hours as needed.     . latanoprost (XALATAN) 0.005 % ophthalmic solution Place 1 drop into the right eye at bedtime.     . lidocaine (XYLOCAINE) 5 % ointment Apply 1 application topically daily as needed (boby pain).     [name] losartan (COZAAR) 100 MG tablet TAKE 1 TABLET BY MOUTH EVERY DAY 30 tablet 6  . nitroGLYCERIN (NITROSTAT) 0.4 MG SL tablet Place 0.4 mg under the tongue every 5 (five) minutes as needed for chest pain (x 3 tablets daily).    . potassium chloride SA (K-DUR,KLOR-CON) 20 MEQ tablet Take 20 mEq by mouth daily.     [name] warfarin (COUMADIN) 5 MG tablet Take 1 tablet daily except 1 1/2 tablets on Wednesdays and Saturdays 45 tablet 6  . albuterol (PROVENTIL) 2 MG tablet Take 2 mg by mouth 3 (three) times daily.     . benzonatate (TESSALON) 100 MG capsule Take 100 mg by mouth 3 (three) times daily as needed for cough.    . doxycycline (VIBRAMYCIN) 100 MG capsule Take 100 mg by mouth 2 (two) times daily.    . methylPREDNISolone (MEDROL DOSEPAK) 4 MG TBPK tablet Take 4 mg by mouth as directed.     . VENTOLIN  HFA 108 (90 BASE) MCG/ACT inhaler      No facility-administered medications prior to visit.         Objective:   Physical Exam Filed Vitals:   07/07/15 1550  BP: 126/82  Pulse: 61  Height: 5\' 3"  (1.6 m)  Weight: 227 lb (102.967 kg)  SpO2: 93%   Gen: Pleasant, well-nourished, in no distress,  normal affect  ENT: No lesions,  mouth clear,  oropharynx clear, no postnasal drip  Neck: No JVD, no TMG, no carotid bruits  Lungs: No use of accessory muscles, no dullness to percussion, clear without rales or rhonchi  Cardiovascular: RRR, heart sounds normal, no murmur or gallops, no peripheral edema  Musculoskeletal: No deformities, no cyanosis or clubbing  Neuro: alert, non focal  Skin: Warm, no lesions or rashes      Assessment & Plan:  Pulmonary nodules Pulmonary nodules principally in the left upper lobe that were stable on serial CT scans of the chest and negative on PET scan. Her final CT to look for stability is in February 2017. We will follow-up and review after his performed  Pulmonary hypertension Multifactorial, with possible contributions of chronic thromboembolic disease, nocturnal hypoxemia. Consider also contribution of autoimmune disease given her psoriasis. He is not on any targeted therapy at this time. Efficacy of this is not clear to me given the pulmonary venous component and her age. She will likely need a repeat echocardiogram to look for interval change in her pulmonary pressures

## 2015-07-26 ENCOUNTER — Ambulatory Visit (INDEPENDENT_AMBULATORY_CARE_PROVIDER_SITE_OTHER): Payer: Medicare Other | Admitting: *Deleted

## 2015-07-26 DIAGNOSIS — Z5181 Encounter for therapeutic drug level monitoring: Secondary | ICD-10-CM | POA: Diagnosis not present

## 2015-07-26 DIAGNOSIS — I4891 Unspecified atrial fibrillation: Secondary | ICD-10-CM

## 2015-07-26 LAB — POCT INR: INR: 3

## 2015-08-07 ENCOUNTER — Other Ambulatory Visit: Payer: Self-pay | Admitting: Cardiology

## 2015-08-22 ENCOUNTER — Encounter: Payer: Self-pay | Admitting: Cardiology

## 2015-08-23 ENCOUNTER — Ambulatory Visit (INDEPENDENT_AMBULATORY_CARE_PROVIDER_SITE_OTHER): Payer: Medicare Other | Admitting: Cardiology

## 2015-08-23 ENCOUNTER — Ambulatory Visit (INDEPENDENT_AMBULATORY_CARE_PROVIDER_SITE_OTHER): Payer: Medicare Other | Admitting: *Deleted

## 2015-08-23 ENCOUNTER — Encounter: Payer: Self-pay | Admitting: Cardiology

## 2015-08-23 VITALS — BP 122/68 | HR 69 | Ht 62.0 in | Wt 227.0 lb

## 2015-08-23 DIAGNOSIS — I272 Other secondary pulmonary hypertension: Secondary | ICD-10-CM | POA: Diagnosis not present

## 2015-08-23 DIAGNOSIS — R911 Solitary pulmonary nodule: Secondary | ICD-10-CM

## 2015-08-23 DIAGNOSIS — I2510 Atherosclerotic heart disease of native coronary artery without angina pectoris: Secondary | ICD-10-CM

## 2015-08-23 DIAGNOSIS — I5189 Other ill-defined heart diseases: Secondary | ICD-10-CM

## 2015-08-23 DIAGNOSIS — Z5181 Encounter for therapeutic drug level monitoring: Secondary | ICD-10-CM

## 2015-08-23 DIAGNOSIS — I482 Chronic atrial fibrillation, unspecified: Secondary | ICD-10-CM

## 2015-08-23 DIAGNOSIS — I519 Heart disease, unspecified: Secondary | ICD-10-CM

## 2015-08-23 DIAGNOSIS — I10 Essential (primary) hypertension: Secondary | ICD-10-CM | POA: Diagnosis not present

## 2015-08-23 DIAGNOSIS — I4891 Unspecified atrial fibrillation: Secondary | ICD-10-CM

## 2015-08-23 LAB — POCT INR: INR: 3.2

## 2015-08-23 NOTE — Patient Instructions (Signed)
Your physician wants you to follow-up in: 6 months with Dr Ferne Reus will receive a reminder letter in the mail two months in advance. If you don't receive a letter, please call our office to schedule the follow-up appointment.  Your physician recommends that you continue on your current medications as directed. Please refer to the Current Medication list given to you today.   Your physician has requested that you have an echocardiogram. Echocardiography is a painless test that uses sound waves to create images of your heart. It provides your doctor with information about the size and shape of your heart and how well your heart's chambers and valves are working. This procedure takes approximately one hour. There are no restrictions for this procedure.     If you need a refill on your cardiac medications before your next appointment, please call your pharmacy.     Thank you for choosing [facility] !

## 2015-08-23 NOTE — Progress Notes (Signed)
Cardiology Office Note  Date: 08/23/2015   ID: [name], DOB March 01, 1935, MRN [id number]  PCP: Glo Herring., MD  Primary Cardiologist: Rozann Lesches, MD   Chief Complaint  Patient presents with  . Atrial Fibrillation  . Coronary Artery Disease    History of Present Illness: [name] is a 81 y.o. female former patient of Dr. Ron Parker, now establishing follow-up with me. I reviewed her records and updated the chart. This is our first meeting in the office. She was last seen by Ms. Ingold NP back in October 2016. She also had a visit in November 2016 with Dr. Lamonte Sakai in the Pulmonary division.  She presents for a routine visit today. In terms of her atrial fibrillation, she does not report any regular sense of palpitations or bothersome symptoms. She continues on Coumadin with follow-up in the anticoagulation clinic. No spontaneous bleeding problems reported.  She does not indicate any angina symptoms with her typical activities. She has a NuStep machine at her home that she uses for exercise. She uses nitroglycerin perhaps only once or twice in an entire year. Last myocardial perfusion study was performed back in February 2012 per Dr. Ron Parker previous notes indicated a small region of scar/ischemia in the inferolateral wall which was managed medically.  We reviewed her remaining medications which are outlined below. She reports follow up with Dr. Gerarda Fraction for lipid management.  Last echocardiogram was in 2013 as detailed below. We discussed obtaining a follow-up study for reassessment of cardiac structure and function as well as pulmonary pressures.  Past Medical History  Diagnosis Date  . Essential hypertension   . Hyperlipidemia   . Chronic atrial fibrillation ([place])     Since 2011  . CAD (coronary artery disease)     BMS to LAD 07/2005  . Degenerative arthritis of right knee   . Morbid obesity ([place])   . Psoriasis   . Right ventricular dysfunction   . Tricuspid  regurgitation     Severe  . Pulmonary hypertension (HCC)     Mixed, PASP 67 mmHg at [facility] 2013  . GERD (gastroesophageal reflux disease)     Past Surgical History  Procedure Laterality Date  . Cholecystectomy    . Ankle surgery Right   . Colonoscopy    . Carpal tunnel release  11/29/2011    Procedure: CARPAL TUNNEL RELEASE;  Surgeon: Cammie Sickle., MD;  Location: [place];  Service: Orthopedics;  Laterality: Right;    Current Outpatient Prescriptions  Medication Sig Dispense Refill  . ALPRAZolam (XANAX) 0.5 MG tablet Take 0.75 mg by mouth at bedtime as needed for sleep.     [name] aspirin 81 MG tablet Take 81 mg by mouth daily.      [name] atorvastatin (LIPITOR) 80 MG tablet TAKE 1/2 TABLET BY MOUTH DAILY 45 tablet 2  . AZOPT 1 % ophthalmic suspension Place 1 drop into both eyes 3 (three) times daily.     . brimonidine (ALPHAGAN) 0.2 % ophthalmic solution Place 1 drop into both eyes 2 (two) times daily.     . cetirizine (ZYRTEC) 10 MG tablet Take 10 mg by mouth daily.    . ciprofloxacin (CIPRO) 500 MG tablet Take 1 tablet by mouth 2 (two) times daily.    . Coenzyme Q10 (COQ10) 50 MG CAPS Take 2 tablets by mouth daily.      . fluticasone (FLONASE) 50 MCG/ACT nasal spray Place 2 sprays into both nostrils daily.     [name]  furosemide (LASIX) 20 MG tablet Take 20 mg by mouth daily.    [name] HYDROcodone-acetaminophen (NORCO/VICODIN) 5-325 MG tablet Take 1 tablet by mouth every 4 (four) hours as needed.     . latanoprost (XALATAN) 0.005 % ophthalmic solution Place 1 drop into the right eye at bedtime.     . lidocaine (XYLOCAINE) 5 % ointment Apply 1 application topically daily as needed (boby pain).     [name] losartan (COZAAR) 100 MG tablet TAKE 1 TABLET BY MOUTH EVERY DAY 30 tablet 6  . nitroGLYCERIN (NITROSTAT) 0.4 MG SL tablet Place 0.4 mg under the tongue every 5 (five) minutes as needed for chest pain (x 3 tablets daily).    . potassium chloride SA (K-DUR,KLOR-CON) 20 MEQ tablet Take 20  mEq by mouth daily.     [name] warfarin (COUMADIN) 5 MG tablet Take 1 tablet daily except 1 1/2 tablets on Wednesdays and Saturdays 45 tablet 6   No current facility-administered medications for this visit.   Allergies:  Review of patient's allergies indicates no known allergies.   Social History: The patient  reports that she quit smoking about 29 years ago. Her smoking use included Cigarettes. She has a .15 pack-year smoking history. She has never used smokeless tobacco. She reports that she does not drink alcohol or use illicit drugs.   ROS:  Please see the history of present illness. Otherwise, complete review of systems is positive for NYHA class II dyspnea.  All other systems are reviewed and negative.   Physical Exam: VS:  BP 122/68 mmHg  Pulse 69  Ht 5\' 2"  (1.575 m)  Wt 227 lb (102.967 kg)  BMI 41.51 kg/m2  SpO2 91%, BMI Body mass index is 41.51 kg/(m^2).  Wt Readings from Last 3 Encounters:  08/23/15 227 lb (102.967 kg)  07/07/15 227 lb (102.967 kg)  06/13/15 225 lb 6.4 oz (102.241 kg)    General: Overweight woman, appears comfortable at rest. Uses a rolling walker. HEENT: Conjunctiva and lids normal, oropharynx clear. Neck: Supple, no elevated JVP or carotid bruits, no thyromegaly. Lungs: Clear to auscultation, nonlabored breathing at rest. Cardiac: Irregularly irregular, no S3, soft apical systolic murmur, no pericardial rub. Abdomen: Soft, nontender, bowel sounds present, no guarding or rebound. Extremities: No pitting edema, distal pulses 2+. Skin: Warm and dry. Musculoskeletal: No kyphosis. Neuropsychiatric: Alert and oriented x3, affect grossly appropriate.  ECG: Tracing from 06/13/2015 showed atrial fibrillation with slow ventricular response, low voltage and rightward axis.  Other Studies Reviewed Today:  Echocardiogram 06/08/2012: Study Conclusions  - Left ventricle: The cavity size was normal. Wall thickness was normal. Systolic function was normal. The  estimated ejection fraction was in the range of 55% to 60%. - Mitral valve: Mild regurgitation. - Left atrium: The atrium was severely dilated. - Right atrium: The atrium was severely dilated. - Pulmonary arteries: PA peak pressure: 76mm Hg (S).  Chest x-ray 05/29/2015: FINDINGS: The lungs are mildly hyperinflated. There is no focal infiltrate. Known pulmonary nodules seen on the previous CT scan are not evident on this plain radiographic series. The cardiac silhouette remains enlarged. The pulmonary vascularity is not engorged. Fine detail of the thoracic spine is limited. There is loss of height of lower thoracic and upper lumbar vertebral bodies. There is prominent levocurvature centered in the lower thoracic spine which is stable.  IMPRESSION: COPD. Stable cardiomegaly without pulmonary edema. No acute cardiopulmonary abnormality.  Assessment and Plan:  1. Chronic atrial fibrillation, symptomatically stable. Continue plan for heart rate control and  anticoagulation. He follows in the anticoagulation clinic.  2. CAD status post BMS to the LAD in December 2006. Noninvasive ischemic testing 3 years ago was overall low risk as outlined above. She does not report any significant angina symptoms and use of nitroglycerin very infrequently. Continue observation for now.  3. Essential hypertension, blood pressure is well controlled today. She is on Cozaar.  4. History of mild right ventricular dysfunction with moderate to severe pulmonary hypertension based on prior evaluation. Follow-up echocardiogram is planned for reassessment.  5. History of pulmonary nodule, follow-up chest CT pending per Dr. Lamonte Sakai.  Current medicines were reviewed with the patient today.   Orders Placed This Encounter  Procedures  . Echocardiogram    Disposition: FU with me in 6 months.   Signed, Satira Sark, MD, [facility] 08/23/2015 1:27 PM    [facility] at [facility] [address] [place], [place] [address] Phone: [phone]; Fax: [phone]

## 2015-08-30 ENCOUNTER — Other Ambulatory Visit: Payer: Self-pay

## 2015-08-30 ENCOUNTER — Ambulatory Visit (HOSPITAL_COMMUNITY): Payer: Medicare Other | Attending: Cardiovascular Disease

## 2015-08-30 DIAGNOSIS — I10 Essential (primary) hypertension: Secondary | ICD-10-CM | POA: Insufficient documentation

## 2015-08-30 DIAGNOSIS — I482 Chronic atrial fibrillation, unspecified: Secondary | ICD-10-CM

## 2015-08-30 DIAGNOSIS — E785 Hyperlipidemia, unspecified: Secondary | ICD-10-CM | POA: Insufficient documentation

## 2015-08-30 DIAGNOSIS — Z87891 Personal history of nicotine dependence: Secondary | ICD-10-CM | POA: Insufficient documentation

## 2015-08-30 DIAGNOSIS — I4891 Unspecified atrial fibrillation: Secondary | ICD-10-CM | POA: Diagnosis not present

## 2015-08-30 DIAGNOSIS — I272 Other secondary pulmonary hypertension: Secondary | ICD-10-CM

## 2015-08-31 ENCOUNTER — Telehealth: Payer: Self-pay

## 2015-08-31 NOTE — Telephone Encounter (Signed)
Forward to dr byrum

## 2015-08-31 NOTE — Telephone Encounter (Signed)
-----   Message from Merlene Laughter, LPN sent at [id number]  7:39 AM EST -----   ----- Message -----    From: Satira Sark, MD    Sent: 08/30/2015   6:43 PM      To: Merlene Laughter, LPN, Collene Gobble, MD  Reviewed report. Normal LVEF as before. There is mild RV dysfunction and severe pulmonary hypertension (PASP 70 mmHg). This was documented in the past as well with Dr. Ron Parker. She had right heart catheterization in 2013 that showed PASP 67 mmHg and probable mixed pulmonary hypertension. I am not sure if further medical therapy was discussed (sildenafil?). Will forward to Dr. Lamonte Sakai for review - he saw patient for Pulmonary visit recently.

## 2015-09-08 DIAGNOSIS — J449 Chronic obstructive pulmonary disease, unspecified: Secondary | ICD-10-CM | POA: Diagnosis not present

## 2015-09-08 DIAGNOSIS — Z1389 Encounter for screening for other disorder: Secondary | ICD-10-CM | POA: Diagnosis not present

## 2015-09-08 DIAGNOSIS — Z6841 Body Mass Index (BMI) 40.0 and over, adult: Secondary | ICD-10-CM | POA: Diagnosis not present

## 2015-09-08 DIAGNOSIS — G894 Chronic pain syndrome: Secondary | ICD-10-CM | POA: Diagnosis not present

## 2015-09-13 ENCOUNTER — Ambulatory Visit (INDEPENDENT_AMBULATORY_CARE_PROVIDER_SITE_OTHER): Payer: Medicare Other | Admitting: *Deleted

## 2015-09-13 DIAGNOSIS — Z5181 Encounter for therapeutic drug level monitoring: Secondary | ICD-10-CM

## 2015-09-13 DIAGNOSIS — I4891 Unspecified atrial fibrillation: Secondary | ICD-10-CM

## 2015-09-13 LAB — POCT INR: INR: 2.4

## 2015-09-19 ENCOUNTER — Other Ambulatory Visit (HOSPITAL_COMMUNITY): Payer: Self-pay | Admitting: Physician Assistant

## 2015-09-19 DIAGNOSIS — Z1231 Encounter for screening mammogram for malignant neoplasm of breast: Secondary | ICD-10-CM

## 2015-09-21 DIAGNOSIS — E782 Mixed hyperlipidemia: Secondary | ICD-10-CM | POA: Diagnosis not present

## 2015-09-21 DIAGNOSIS — G894 Chronic pain syndrome: Secondary | ICD-10-CM | POA: Diagnosis not present

## 2015-09-27 ENCOUNTER — Ambulatory Visit (HOSPITAL_COMMUNITY)
Admission: RE | Admit: 2015-09-27 | Discharge: 2015-09-27 | Disposition: A | Discharge status: Home / Self Care | Payer: Medicare Other | Source: Ambulatory Visit | Attending: Physician Assistant | Admitting: Physician Assistant

## 2015-09-27 DIAGNOSIS — Z1231 Encounter for screening mammogram for malignant neoplasm of breast: Secondary | ICD-10-CM | POA: Diagnosis not present

## 2015-10-03 ENCOUNTER — Ambulatory Visit
Admission: RE | Admit: 2015-10-03 | Discharge: 2015-10-03 | Disposition: A | Discharge status: Home / Self Care | Payer: Medicare Other | Source: Ambulatory Visit | Attending: Pulmonary Disease | Admitting: Pulmonary Disease

## 2015-10-03 ENCOUNTER — Telehealth: Payer: Self-pay | Admitting: Emergency Medicine

## 2015-10-03 ENCOUNTER — Ambulatory Visit (INDEPENDENT_AMBULATORY_CARE_PROVIDER_SITE_OTHER)
Admission: RE | Admit: 2015-10-03 | Discharge: 2015-10-03 | Disposition: A | Discharge status: Home / Self Care | Payer: Medicare Other | Source: Ambulatory Visit | Attending: Emergency Medicine | Admitting: Emergency Medicine

## 2015-10-03 DIAGNOSIS — R918 Other nonspecific abnormal finding of lung field: Secondary | ICD-10-CM

## 2015-10-03 DIAGNOSIS — R911 Solitary pulmonary nodule: Secondary | ICD-10-CM

## 2015-10-03 NOTE — Telephone Encounter (Signed)
Called stacy [phone] and received VM--LMTCB x1

## 2015-10-03 NOTE — Telephone Encounter (Signed)
Spoke with [employer]. The CT order that is in [place] is under [facility] name and needs to be placed under RB. I have done so. Nothing further needed

## 2015-10-09 ENCOUNTER — Ambulatory Visit (INDEPENDENT_AMBULATORY_CARE_PROVIDER_SITE_OTHER): Payer: Medicare Other | Admitting: Emergency Medicine

## 2015-10-09 ENCOUNTER — Other Ambulatory Visit (INDEPENDENT_AMBULATORY_CARE_PROVIDER_SITE_OTHER): Payer: Medicare Other

## 2015-10-09 ENCOUNTER — Encounter: Payer: Self-pay | Admitting: Emergency Medicine

## 2015-10-09 VITALS — BP 110/72 | HR 51 | Ht 63.0 in | Wt 236.0 lb

## 2015-10-09 DIAGNOSIS — I272 Other secondary pulmonary hypertension: Secondary | ICD-10-CM

## 2015-10-09 DIAGNOSIS — I2510 Atherosclerotic heart disease of native coronary artery without angina pectoris: Secondary | ICD-10-CM | POA: Diagnosis not present

## 2015-10-09 LAB — HEPATIC FUNCTION PANEL
ALT: 17 U/L (ref 0–35)
AST: 25 U/L (ref 0–37)
Albumin: 4.3 g/dL (ref 3.5–5.2)
Alkaline Phosphatase: 68 U/L (ref 39–117)
BILIRUBIN DIRECT: 0.2 mg/dL (ref 0.0–0.3)
BILIRUBIN TOTAL: 0.9 mg/dL (ref 0.2–1.2)
Total Protein: 7.1 g/dL (ref 6.0–8.3)

## 2015-10-09 NOTE — Assessment & Plan Note (Signed)
Multifactorial pulmonary hypertension with contributions of left-sided heart disease as well as autoimmune disease (psoriasis). She is on anticoagulation. Her right heart catheterization in 2013 documented the [place]. I believe that it would be reasonable to start her on medical therapy to see how she responds. She also needs a walking oximetry today. We will initiate paperwork for letaris and adcirca, start these together. Continue her current diuretic regimen

## 2015-10-09 NOTE — Patient Instructions (Addendum)
We will start the paperwork process to obtain 2 new medications, Adcirca 40 mg daily and Letaris 5mg  daily.  Lab work today Your CT scan performed this month shows that your pulmonary nodules are stable; you should not need any further CT scans to follow these nodules             Walking oximetry today Continue Coumadin as ordered Continue your other medications as you have been taking them Follow with Dr Lamonte Sakai next available

## 2015-10-09 NOTE — Progress Notes (Signed)
Subjective:    Patient ID: [name], female    DOB: 1935/01/18, 81 y.o.   MRN: [id number]  HPI 81 year old woman with a very remote and small tobacco history, history of hypertension, atrial fibrillation, coronary artery disease, psoriasis. She has secondary multifactorial pulmonary hypertension that has been presumed related to obesity with chronic hypoxemia, left-sided heart disease and presumed chronic thromboembolic disease. As noted she does also have a history of autoimmune process, psoriasis.  She is not on any targeted [place] therapy. Last seen by Dr Gwenette Greet here in February 2015. She had a CT scan of the chest 10/05/13 that showed pulmonary nodules, the 2 most dominant were in the left upper lobe, and a subsequent PET scan on 04/11/14 showed that these were not hypermetabolic. I have personally reviewed the scans as well as a repeat CT scan done on 09/27/14 that showed no interval change. She is due for a final scan to confirm stability in February 2017. She underwent a polysomnogram in November 2013 with an AHI of 4.1 and RDI of 5.9.   She reports no SOB, is able to exert. Is able to do housework. She recently had a URI and cough, took 8 weeks to get over the cough. Was treated with pred and abx.   ROV 10/09/15 -- follow-up visit. She has a history of psoriasis, hypertension, atrial fibrillation. He also carries a history of presumed chronic thromboembolic disease. In evaluation for obstructive sleep apnea showed an AHI of 4.1. He has secondary pulmonary hypertension that has been deemed multifactorial. She underwent a repeat CT scan of her chest this month that I have personally reviewed. This showed that his pulmonary nodules are stable to smaller in size. She also had an echocardiogram on 08/30/15 that showed an estimated PASP of 70 mmHg and mild RV dysfunction.  She has occasional SOB but seldom, denies limitations. She uses a walker for balance.      Review of Systems  Constitutional:  Negative for fever and unexpected weight change.  HENT: Negative for congestion, dental problem, ear pain, nosebleeds, postnasal drip, rhinorrhea, sinus pressure, sneezing, sore throat and trouble swallowing.   Eyes: Negative for redness and itching.  Respiratory: Negative for cough, chest tightness, shortness of breath and wheezing.   Cardiovascular: Negative for palpitations and leg swelling.  Gastrointestinal: Negative for nausea and vomiting.  Genitourinary: Negative for dysuria.  Musculoskeletal: Negative for joint swelling.  Skin: Negative for rash.  Neurological: Negative for headaches.  Hematological: Does not bruise/bleed easily.  Psychiatric/Behavioral: Negative for dysphoric mood. The patient is not nervous/anxious.     Past Medical History  Diagnosis Date  . Essential hypertension   . Hyperlipidemia   . Chronic atrial fibrillation ([place])     Since 2011  . CAD (coronary artery disease)     BMS to LAD 07/2005  . Degenerative arthritis of right knee   . Morbid obesity ([place])   . Psoriasis   . Right ventricular dysfunction   . Tricuspid regurgitation     Severe  . Pulmonary hypertension (HCC)     Mixed, PASP 67 mmHg at [facility] 2013  . GERD (gastroesophageal reflux disease)      Family History  Problem Relation Age of Onset  . Heart attack Mother   . Coronary artery disease Father   . Stroke Father   . Heart attack Brother   . Heart failure Brother   . COPD Brother   . Emphysema Sister   . Emphysema Brother  Social History   Social History  . Marital Status: Married    Spouse Name: N/A  . Number of Children: 2  . Years of Education: N/A   Occupational History  . retired    Social History Main Topics  . Smoking status: Former Smoker -- 0.30 packs/day for .5 years    Types: Cigarettes    Quit date: 08/19/1986  . Smokeless tobacco: Never Used     Comment: pt only smoked for only a few months.  . Alcohol Use: No  . Drug Use: No  . Sexual Activity: Not  on file   Other Topics Concern  . Not on file   Social History Narrative     No Known Allergies   Outpatient Prescriptions Prior to Visit  Medication Sig Dispense Refill  . ALPRAZolam (XANAX) 0.5 MG tablet Take 0.75 mg by mouth at bedtime as needed for sleep.     [name] aspirin 81 MG tablet Take 81 mg by mouth daily.      [name] atorvastatin (LIPITOR) 80 MG tablet TAKE 1/2 TABLET BY MOUTH DAILY 45 tablet 2  . AZOPT 1 % ophthalmic suspension Place 1 drop into both eyes 3 (three) times daily.     . brimonidine (ALPHAGAN) 0.2 % ophthalmic solution Place 1 drop into both eyes 2 (two) times daily.     . cetirizine (ZYRTEC) 10 MG tablet Take 10 mg by mouth daily.    . Coenzyme Q10 (COQ10) 50 MG CAPS Take 2 tablets by mouth daily.      . fluticasone (FLONASE) 50 MCG/ACT nasal spray Place 2 sprays into both nostrils daily.     . furosemide (LASIX) 20 MG tablet Take 20 mg by mouth daily.    [name] HYDROcodone-acetaminophen (NORCO/VICODIN) 5-325 MG tablet Take 1 tablet by mouth every 4 (four) hours as needed.     . latanoprost (XALATAN) 0.005 % ophthalmic solution Place 1 drop into the right eye at bedtime.     . lidocaine (XYLOCAINE) 5 % ointment Apply 1 application topically daily as needed (boby pain).     [name] losartan (COZAAR) 100 MG tablet TAKE 1 TABLET BY MOUTH EVERY DAY 30 tablet 6  . nitroGLYCERIN (NITROSTAT) 0.4 MG SL tablet Place 0.4 mg under the tongue every 5 (five) minutes as needed for chest pain (x 3 tablets daily).    . potassium chloride SA (K-DUR,KLOR-CON) 20 MEQ tablet Take 20 mEq by mouth daily.     [name] warfarin (COUMADIN) 5 MG tablet Take 1 tablet daily except 1 1/2 tablets on Wednesdays and Saturdays 45 tablet 6  . ciprofloxacin (CIPRO) 500 MG tablet Take 1 tablet by mouth 2 (two) times daily. Reported on 10/09/2015     No facility-administered medications prior to visit.         Objective:   Physical Exam Filed Vitals:   10/09/15 1519  BP: 110/72  Pulse: 51  Height: 5\' 3"  (1.6 m)    Weight: 236 lb (107.049 kg)  SpO2: 92%   Gen: Pleasant, well-nourished, in no distress,  normal affect  ENT: No lesions,  mouth clear,  oropharynx clear, no postnasal drip  Neck: No JVD, no TMG, no carotid bruits  Lungs: No use of accessory muscles, no dullness to percussion, clear without rales or rhonchi  Cardiovascular: RRR, heart sounds normal, no murmur or gallops, no peripheral edema  Musculoskeletal: No deformities, no cyanosis or clubbing  Neuro: alert, non focal  Skin: Warm, no lesions or rashes  CT chest 10/03/15 --  COMPARISON: 09/27/2014  FINDINGS: Left upper lobe pulmonary nodule on image 21 measures 7.5 mm compared to 6.5 mm previously and 7.5 mm and 2015.  Anterior left upper lobe nodule on image 24 measures up to 9.5 mm compared with 9.5 mm previously.  Small left lower lobe pulmonary nodule peripherally on image 32 is stable. Left lower lobe pulmonary nodule on image 36 measures 6 mm compared with 8 mm previously.  Right lower lobe pulmonary nodule posteriorly on image 44 measures 4 mm and is stable.  No new pulmonary nodule. Heart is enlarged. Diffuse coronary artery calcifications. Pulmonary arteries are enlarged suggesting pulmonary arterial hypertension, stable. No mediastinal, hilar, or axillary adenopathy. Chest wall soft tissues are unremarkable. Imaging into the upper abdomen shows no acute findings.  No acute bony abnormality or focal bone lesion. Leftward scoliosis and degenerative changes in the thoracic spine.  IMPRESSION: Stable or slightly smaller bilateral pulmonary nodules. No new or enlarging pulmonary nodules. These are stable dating back to February of 2015 and are compatible with benign nodules.  Cardiomegaly, pulmonary arterial hypertension.  Coronary artery disease.      Assessment & Plan:  Pulmonary hypertension Multifactorial pulmonary hypertension with contributions of left-sided heart disease as well  as autoimmune disease (psoriasis). She is on anticoagulation. Her right heart catheterization in 2013 documented the [place]. I believe that it would be reasonable to start her on medical therapy to see how she responds. She also needs a walking oximetry today. We will initiate paperwork for letaris and adcirca, start these together. Continue her current diuretic regimen

## 2015-10-11 ENCOUNTER — Ambulatory Visit (INDEPENDENT_AMBULATORY_CARE_PROVIDER_SITE_OTHER): Payer: Medicare Other | Admitting: *Deleted

## 2015-10-11 DIAGNOSIS — I4891 Unspecified atrial fibrillation: Secondary | ICD-10-CM | POA: Diagnosis not present

## 2015-10-11 DIAGNOSIS — Z5181 Encounter for therapeutic drug level monitoring: Secondary | ICD-10-CM

## 2015-10-11 LAB — POCT INR: INR: 2.1

## 2015-10-12 ENCOUNTER — Telehealth: Payer: Self-pay | Admitting: *Deleted

## 2015-10-12 NOTE — Telephone Encounter (Signed)
Received fax and placed in [place] folder to be passed out at end of today

## 2015-10-12 NOTE — Telephone Encounter (Signed)
I am working on the PA for [employer] for this pt. Please advise if the pt is in [facility] and if pt is WHO Functional Class II or III? Thanks.

## 2015-10-13 NOTE — Telephone Encounter (Signed)
Received drug exception form for letairis from [facility], placed form in [place] folder to be passed out at end of today

## 2015-10-13 NOTE — Telephone Encounter (Signed)
nicole from [facility] calling [phone], speak to nurse about adcirca

## 2015-10-13 NOTE — Telephone Encounter (Signed)
Attempted to get through at number provided- kept being told they do not bring patient up in their system; will forward to [facility] as she has the paper for PA request in [place].

## 2015-10-16 NOTE — Telephone Encounter (Signed)
Form for Milda Smart was never done. This has been filled out and faxed in. Form has been placed in the blue accordion file in triage.  I called and spoke with Lovena Le at [facility]. Adcirca was denied. They will be faxing the denial to the [place] office. Will await denial.

## 2015-10-16 NOTE — Telephone Encounter (Signed)
Per [facility] they were told this was a denied but they were sending appeal info she has been put on hold till they hear back from [place]  [phone]

## 2015-10-17 ENCOUNTER — Telehealth: Payer: Self-pay | Admitting: Emergency Medicine

## 2015-10-17 DIAGNOSIS — E669 Obesity, unspecified: Secondary | ICD-10-CM | POA: Diagnosis not present

## 2015-10-17 DIAGNOSIS — G894 Chronic pain syndrome: Secondary | ICD-10-CM | POA: Diagnosis not present

## 2015-10-17 DIAGNOSIS — I10 Essential (primary) hypertension: Secondary | ICD-10-CM | POA: Diagnosis not present

## 2015-10-17 DIAGNOSIS — Z1389 Encounter for screening for other disorder: Secondary | ICD-10-CM | POA: Diagnosis not present

## 2015-10-17 DIAGNOSIS — J449 Chronic obstructive pulmonary disease, unspecified: Secondary | ICD-10-CM | POA: Diagnosis not present

## 2015-10-17 DIAGNOSIS — Z6841 Body Mass Index (BMI) 40.0 and over, adult: Secondary | ICD-10-CM | POA: Diagnosis not present

## 2015-10-17 DIAGNOSIS — E782 Mixed hyperlipidemia: Secondary | ICD-10-CM | POA: Diagnosis not present

## 2015-10-17 DIAGNOSIS — I4891 Unspecified atrial fibrillation: Secondary | ICD-10-CM | POA: Diagnosis not present

## 2015-10-17 DIAGNOSIS — I272 Other secondary pulmonary hypertension: Secondary | ICD-10-CM | POA: Diagnosis not present

## 2015-10-17 NOTE — Telephone Encounter (Signed)
Attempted to call [facility], was told by a representative that no cal was made regarding this reference number given.   Will continue to hold to await appeal decision.

## 2015-10-17 NOTE — Telephone Encounter (Signed)
have a few questions about this PA for this med. Janece [employer] health plans [id number] reference [id number]

## 2015-10-17 NOTE — Telephone Encounter (Signed)
We received the appeal information for Adcirca. Form has been filled out and faxed back along with the pt's right heart cath report from 2013. Will await appeal decision and PA decision on Letairis.  **All forms have been placed in the blue accordion file in triage.**

## 2015-10-17 NOTE — Telephone Encounter (Signed)
Called pt, an unnamed man said she was unavailable at this time and to call back tomorrow.  Will hold message to do so.

## 2015-10-18 ENCOUNTER — Telehealth: Payer: Self-pay | Admitting: Emergency Medicine

## 2015-10-18 NOTE — Telephone Encounter (Signed)
Called [facility] and spoke April. They had further questions they needed answered in order to complete PA. These questions have been answered. Reference number for this call >> [id number]. Will await PA on Letairis and appeal on Adcirca.

## 2015-10-18 NOTE — Telephone Encounter (Signed)
Sharii calling to speak to nurse about Myrtis [place] reference [id number], phone number [phone] States they sent a faxed letter on yesterday about this as well

## 2015-10-18 NOTE — Telephone Encounter (Signed)
Spoke with the pt  She is calling to check on Adcirca  I advised that we are trying to get med approved by her ins  She verbalized understanding  Nothing further needed

## 2015-10-18 NOTE — Telephone Encounter (Signed)
Spoke with Helene Kelp. She is calling about setting the pt up for the [place] lab program. Advised her that [place] does not typically do this. RB monitors these labs himself when the pt comes in.

## 2015-10-19 NOTE — Telephone Encounter (Signed)
LVM for Cathynia to return call.

## 2015-10-19 NOTE — Telephone Encounter (Signed)
cathynia cb [phone] ext [phone] calling to speak to [name] about adcirca

## 2015-10-20 NOTE — Telephone Encounter (Addendum)
We received faxes that [name] have been denied.  RB - please advise what we should do from here. Thanks.

## 2015-10-20 NOTE — Telephone Encounter (Signed)
I will have to look at the case and see why they were denied, then figure out how to address the insurance barriers.

## 2015-10-23 NOTE — Telephone Encounter (Signed)
We received a fax from [facility]. The appeal for [name] has been approved.

## 2015-10-24 NOTE — Telephone Encounter (Signed)
Thanks

## 2015-10-26 ENCOUNTER — Telehealth: Payer: Self-pay | Admitting: Emergency Medicine

## 2015-10-26 NOTE — Telephone Encounter (Signed)
[place]

## 2015-10-26 NOTE — Telephone Encounter (Signed)
lmtcb X1 for Cathyna.

## 2015-10-26 NOTE — Telephone Encounter (Signed)
Called and spoke with [name]  She is calling to check the status on appeal for Letairis  She states that they have faxed over a form to be filled out twice now   Checked with [place]  This has already been faxed to [facility] on 10/16/15  I called Doris back and she states that [facility] told her that as of 10/24/15, they never received anything  I have re faxed this is [facility]  Nothing further needed at this time

## 2015-10-27 ENCOUNTER — Telehealth: Payer: Self-pay | Admitting: Emergency Medicine

## 2015-10-27 MED ORDER — TADALAFIL (PAH) 20 MG PO TABS: 40.0000 mg | ORAL_TABLET | Freq: Every day | ORAL | Status: DC

## 2015-10-27 NOTE — Telephone Encounter (Signed)
Per 10/09/15 OV: Patient Instructions       We will start the paperwork process to obtain 2 new medications, Adcirca 40 mg daily and Letaris 5mg  daily.   Lab work today Your CT scan performed this month shows that your pulmonary nodules are stable; you should not need any further CT scans to follow these nodules              Walking oximetry today Continue Coumadin as ordered Continue your other medications as you have been taking them Follow with Dr Lamonte Sakai next available    ---  Called and spoke with [name]. She reports she is being told by [facility] they do not have RX on file for pt adcirca and will need this to be sent to them. She provided phone number to call [phone] Called and spoke with [name]. He reports we need to fax over adcirca RX to [phone]. i have sent this over. Nothing further needed

## 2015-10-30 ENCOUNTER — Telehealth: Payer: Self-pay | Admitting: Emergency Medicine

## 2015-10-30 NOTE — Telephone Encounter (Signed)
I received a fax from [facility] dated 10/28/15. Pt's Milda Smart has been approved 10/18/15 until further notice. The appeal was initiated on 10/26/15. Member ID: [id number]. Prior Auth Reference #: [id number].  Spoke with Caryl Pina at [place]. She is aware of the above information. This information will be updated in their system. Nothing further was needed at this time.

## 2015-10-30 NOTE — Telephone Encounter (Signed)
[place] [phone] Patient ID# [id number] Spoke with Bernie Covey needs appeal - medication was denied 10/19/15, appeal letter was faxed around 10/19/15 to our office [phone].  Advised that she is going to contact the carrier office to have denial/appeal information faxed over to our office.  Carrier office # [phone]  Will send to Ria Comment to keep any eye out.  FYI : Adcirca approved 10/26/15

## 2015-11-01 ENCOUNTER — Telehealth: Payer: Self-pay | Admitting: Cardiology

## 2015-11-01 NOTE — Telephone Encounter (Signed)
Pt has a question about some medications that she has just recently started taking

## 2015-11-02 NOTE — Telephone Encounter (Signed)
Pt stated that her pulmonologist started her on a new medication ( Adcirca 20 MG) and the she is scared to start the medication now that she has read the pamphlet that was enclosed with the medication. I advised her to call and talk to her pulmonologist about her concerns, as I am sure they weighed the benefits verses the risks when it was prescribed. I also advised her that she could speak to her pharmacist about the medication as well.

## 2015-11-06 ENCOUNTER — Telehealth: Payer: Self-pay | Admitting: Emergency Medicine

## 2015-11-06 NOTE — Telephone Encounter (Signed)
ATC pt - phone line busy, wcb

## 2015-11-07 NOTE — Telephone Encounter (Signed)
Pt states that she just received Adcirca the other day so she has not started taking either yet. Pt just wanted to advise that she will begin taking them today. Asked pt to call if any side effects or other issues noted. No further questions or concerns.

## 2015-11-08 ENCOUNTER — Ambulatory Visit (INDEPENDENT_AMBULATORY_CARE_PROVIDER_SITE_OTHER): Payer: Medicare Other | Admitting: *Deleted

## 2015-11-08 DIAGNOSIS — Z5181 Encounter for therapeutic drug level monitoring: Secondary | ICD-10-CM

## 2015-11-08 DIAGNOSIS — Z6841 Body Mass Index (BMI) 40.0 and over, adult: Secondary | ICD-10-CM | POA: Diagnosis not present

## 2015-11-08 DIAGNOSIS — I4891 Unspecified atrial fibrillation: Secondary | ICD-10-CM | POA: Diagnosis not present

## 2015-11-08 DIAGNOSIS — J0100 Acute maxillary sinusitis, unspecified: Secondary | ICD-10-CM | POA: Diagnosis not present

## 2015-11-08 DIAGNOSIS — J84112 Idiopathic pulmonary fibrosis: Secondary | ICD-10-CM | POA: Diagnosis not present

## 2015-11-08 DIAGNOSIS — J449 Chronic obstructive pulmonary disease, unspecified: Secondary | ICD-10-CM | POA: Diagnosis not present

## 2015-11-08 DIAGNOSIS — Z1389 Encounter for screening for other disorder: Secondary | ICD-10-CM | POA: Diagnosis not present

## 2015-11-08 LAB — POCT INR: INR: 2.3

## 2015-11-16 ENCOUNTER — Telehealth: Payer: Self-pay | Admitting: Emergency Medicine

## 2015-11-16 DIAGNOSIS — I272 Pulmonary hypertension, unspecified: Secondary | ICD-10-CM

## 2015-11-16 NOTE — Telephone Encounter (Signed)
Spoke with Judeen Hammans at [place] [phone]) - she was able to pull up the lab needed in the system and said that they can use it. Nothing is needed to be faxed.  Pt on their way to the lab and is aware that the lab closes at [place] in the lab is also aware that the patient is on the way. Nothing further needed.

## 2015-11-16 NOTE — Telephone Encounter (Signed)
Called spoke with pt. She reports she started the letaris and adcirca 11/06/15. On 11/08/15, pt reports she was having sinus issues and saw PCP. Was given augmentin x 10 days and has been on this. Pt also is taking lasix 40 mg 3 tabs daily. Reports she is not having very much urine output since starting these new medications. She reports she is also feeling flushed.  She wants to know if the letaris and adcirca could cause this? She is taking these at night. Also has noticed after she takes these medications, she has fluttering in her heart.  Please advise Dr. Lamonte Sakai thanks

## 2015-11-16 NOTE — Telephone Encounter (Signed)
The letaris and adcirca definitely could be contributing to her flushing. The decreased UOP could also be related, although must also consider contribution from her recent illness. She needs to have BMP drawn by either [place] or her PCP, whichever she would prefer.

## 2015-11-16 NOTE — Telephone Encounter (Signed)
I called spoke with pt. Aware of recs below. Order placed for BMP. Nothing further needed

## 2015-11-17 ENCOUNTER — Other Ambulatory Visit: Payer: Self-pay

## 2015-11-17 ENCOUNTER — Inpatient Hospital Stay (HOSPITAL_COMMUNITY)
Admission: EM | Admit: 2015-11-17 | Discharge: 2015-11-22 | DRG: 292 | Disposition: A | Discharge status: Home Health | Payer: Medicare Other | Attending: Internal Medicine | Admitting: Internal Medicine

## 2015-11-17 ENCOUNTER — Telehealth: Payer: Self-pay | Admitting: Emergency Medicine

## 2015-11-17 ENCOUNTER — Emergency Department (HOSPITAL_COMMUNITY): Payer: Medicare Other

## 2015-11-17 ENCOUNTER — Encounter (HOSPITAL_COMMUNITY): Payer: Self-pay | Admitting: Family Medicine

## 2015-11-17 ENCOUNTER — Other Ambulatory Visit (HOSPITAL_COMMUNITY)
Admission: RE | Admit: 2015-11-17 | Discharge: 2015-11-17 | Disposition: A | Discharge status: Home / Self Care | Payer: Medicare Other | Source: Ambulatory Visit | Attending: Emergency Medicine | Admitting: Emergency Medicine

## 2015-11-17 DIAGNOSIS — E785 Hyperlipidemia, unspecified: Secondary | ICD-10-CM | POA: Diagnosis not present

## 2015-11-17 DIAGNOSIS — Z7901 Long term (current) use of anticoagulants: Secondary | ICD-10-CM

## 2015-11-17 DIAGNOSIS — Z7982 Long term (current) use of aspirin: Secondary | ICD-10-CM | POA: Diagnosis not present

## 2015-11-17 DIAGNOSIS — R609 Edema, unspecified: Secondary | ICD-10-CM | POA: Diagnosis not present

## 2015-11-17 DIAGNOSIS — R0602 Shortness of breath: Secondary | ICD-10-CM | POA: Diagnosis not present

## 2015-11-17 DIAGNOSIS — R001 Bradycardia, unspecified: Secondary | ICD-10-CM | POA: Diagnosis present

## 2015-11-17 DIAGNOSIS — R918 Other nonspecific abnormal finding of lung field: Secondary | ICD-10-CM | POA: Diagnosis not present

## 2015-11-17 DIAGNOSIS — R05 Cough: Secondary | ICD-10-CM | POA: Diagnosis not present

## 2015-11-17 DIAGNOSIS — I5033 Acute on chronic diastolic (congestive) heart failure: Secondary | ICD-10-CM | POA: Diagnosis not present

## 2015-11-17 DIAGNOSIS — Z955 Presence of coronary angioplasty implant and graft: Secondary | ICD-10-CM

## 2015-11-17 DIAGNOSIS — S8011XA Contusion of right lower leg, initial encounter: Secondary | ICD-10-CM | POA: Diagnosis not present

## 2015-11-17 DIAGNOSIS — I2510 Atherosclerotic heart disease of native coronary artery without angina pectoris: Secondary | ICD-10-CM | POA: Diagnosis not present

## 2015-11-17 DIAGNOSIS — I272 Other secondary pulmonary hypertension: Secondary | ICD-10-CM | POA: Insufficient documentation

## 2015-11-17 DIAGNOSIS — Z87891 Personal history of nicotine dependence: Secondary | ICD-10-CM

## 2015-11-17 DIAGNOSIS — I959 Hypotension, unspecified: Secondary | ICD-10-CM | POA: Diagnosis present

## 2015-11-17 DIAGNOSIS — Z6841 Body Mass Index (BMI) 40.0 and over, adult: Secondary | ICD-10-CM | POA: Diagnosis not present

## 2015-11-17 DIAGNOSIS — Z79899 Other long term (current) drug therapy: Secondary | ICD-10-CM | POA: Diagnosis not present

## 2015-11-17 DIAGNOSIS — I482 Chronic atrial fibrillation: Secondary | ICD-10-CM | POA: Diagnosis present

## 2015-11-17 DIAGNOSIS — I4891 Unspecified atrial fibrillation: Secondary | ICD-10-CM | POA: Diagnosis not present

## 2015-11-17 DIAGNOSIS — I509 Heart failure, unspecified: Secondary | ICD-10-CM | POA: Diagnosis not present

## 2015-11-17 DIAGNOSIS — I110 Hypertensive heart disease with heart failure: Secondary | ICD-10-CM | POA: Diagnosis not present

## 2015-11-17 DIAGNOSIS — I10 Essential (primary) hypertension: Secondary | ICD-10-CM | POA: Diagnosis not present

## 2015-11-17 DIAGNOSIS — I5031 Acute diastolic (congestive) heart failure: Secondary | ICD-10-CM | POA: Diagnosis not present

## 2015-11-17 DIAGNOSIS — I252 Old myocardial infarction: Secondary | ICD-10-CM

## 2015-11-17 DIAGNOSIS — Z7951 Long term (current) use of inhaled steroids: Secondary | ICD-10-CM

## 2015-11-17 DIAGNOSIS — K219 Gastro-esophageal reflux disease without esophagitis: Secondary | ICD-10-CM | POA: Diagnosis present

## 2015-11-17 DIAGNOSIS — G4733 Obstructive sleep apnea (adult) (pediatric): Secondary | ICD-10-CM | POA: Diagnosis present

## 2015-11-17 DIAGNOSIS — J811 Chronic pulmonary edema: Secondary | ICD-10-CM | POA: Diagnosis not present

## 2015-11-17 HISTORY — DX: Unspecified osteoarthritis, unspecified site: M19.90

## 2015-11-17 HISTORY — DX: Unspecified atrial fibrillation: I48.91

## 2015-11-17 HISTORY — DX: Dependence on supplemental oxygen: Z99.81

## 2015-11-17 HISTORY — DX: Acute myocardial infarction, unspecified: I21.9

## 2015-11-17 LAB — BASIC METABOLIC PANEL
ANION GAP: 10 (ref 5–15)
ANION GAP: 11 (ref 5–15)
BUN: 33 mg/dL — ABNORMAL HIGH (ref 6–20)
BUN: 29 mg/dL — ABNORMAL HIGH (ref 6–20)
CALCIUM: 9.1 mg/dL (ref 8.9–10.3)
CALCIUM: 9.7 mg/dL (ref 8.9–10.3)
CO2: 29 mmol/L (ref 22–32)
CO2: 28 mmol/L (ref 22–32)
CREATININE: 1.05 mg/dL — AB (ref 0.44–1.00)
CREATININE: 1.03 mg/dL — AB (ref 0.44–1.00)
Chloride: 99 mmol/L — ABNORMAL LOW (ref 101–111)
Chloride: 100 mmol/L — ABNORMAL LOW (ref 101–111)
GFR, EST AFRICAN AMERICAN: 57 mL/min — AB (ref >60)
GFR, EST AFRICAN AMERICAN: 59 mL/min — AB (ref >60)
GFR, EST NON AFRICAN AMERICAN: 50 mL/min — AB (ref >60)
GFR, EST NON AFRICAN AMERICAN: 51 mL/min — AB (ref >60)
GLUCOSE: 103 mg/dL — AB (ref 65–99)
Glucose, Bld: 88 mg/dL (ref 65–99)
Potassium: 3.3 mmol/L — ABNORMAL LOW (ref 3.5–5.1)
Potassium: 3.5 mmol/L (ref 3.5–5.1)
Sodium: 138 mmol/L (ref 135–145)
Sodium: 139 mmol/L (ref 135–145)

## 2015-11-17 LAB — CBC
HEMATOCRIT: 34.5 % — AB (ref 36.0–46.0)
Hemoglobin: 11.3 g/dL — ABNORMAL LOW (ref 12.0–15.0)
MCH: 30.4 pg (ref 26.0–34.0)
MCHC: 32.8 g/dL (ref 30.0–36.0)
MCV: 92.7 fL (ref 78.0–100.0)
PLATELETS: 134 10*3/uL — AB (ref 150–400)
RBC: 3.72 MIL/uL — AB (ref 3.87–5.11)
RDW: 14.3 % (ref 11.5–15.5)
WBC: 5 10*3/uL (ref 4.0–10.5)

## 2015-11-17 LAB — BRAIN NATRIURETIC PEPTIDE: B NATRIURETIC PEPTIDE 5: 270 pg/mL — AB (ref 0.0–100.0)

## 2015-11-17 LAB — I-STAT TROPONIN, ED: Troponin i, poc: 0.02 ng/mL (ref 0.00–0.08)

## 2015-11-17 LAB — PROTIME-INR
INR: 1.8 — ABNORMAL HIGH (ref 0.00–1.49)
PROTHROMBIN TIME: 20.9 s — AB (ref 11.6–15.2)

## 2015-11-17 LAB — TROPONIN I: TROPONIN I: 0.03 ng/mL (ref <0.031)

## 2015-11-17 MED ORDER — ONDANSETRON HCL 4 MG/2ML IJ SOLN: 4.0000 mg | Freq: Four times a day (QID) | INTRAMUSCULAR | Status: DC | PRN

## 2015-11-17 MED ORDER — FUROSEMIDE 10 MG/ML IJ SOLN
60.0000 mg | Freq: Three times a day (TID) | INTRAMUSCULAR | Status: DC
  Administered 2015-11-17 – 2015-11-18 (×3): 60 mg via INTRAVENOUS
  Filled 2015-11-17 (×3): qty 6

## 2015-11-17 MED ORDER — POTASSIUM CHLORIDE CRYS ER 20 MEQ PO TBCR
40.0000 meq | EXTENDED_RELEASE_TABLET | Freq: Two times a day (BID) | ORAL | Status: DC
  Administered 2015-11-17 – 2015-11-22 (×10): 40 meq via ORAL
  Filled 2015-11-17 (×10): qty 2

## 2015-11-17 MED ORDER — FUROSEMIDE 10 MG/ML IJ SOLN
60.0000 mg | Freq: Once | INTRAMUSCULAR | Status: AC
  Administered 2015-11-17: 60 mg via INTRAVENOUS
  Filled 2015-11-17: qty 6

## 2015-11-17 MED ORDER — ASPIRIN EC 81 MG PO TBEC
81.0000 mg | DELAYED_RELEASE_TABLET | Freq: Every day | ORAL | Status: DC
  Administered 2015-11-17 – 2015-11-21 (×5): 81 mg via ORAL
  Filled 2015-11-17 (×6): qty 1

## 2015-11-17 MED ORDER — POLYETHYLENE GLYCOL 3350 17 G PO PACK: 17.0000 g | PACK | Freq: Every day | ORAL | Status: DC | PRN

## 2015-11-17 MED ORDER — RISAQUAD PO CAPS
ORAL_CAPSULE | Freq: Two times a day (BID) | ORAL | Status: DC
  Administered 2015-11-17 – 2015-11-22 (×10): 1 via ORAL
  Filled 2015-11-17 (×10): qty 1

## 2015-11-17 MED ORDER — ACETAMINOPHEN 325 MG PO TABS: 650.0000 mg | ORAL_TABLET | Freq: Four times a day (QID) | ORAL | Status: DC | PRN

## 2015-11-17 MED ORDER — PANTOPRAZOLE SODIUM 40 MG PO TBEC
40.0000 mg | DELAYED_RELEASE_TABLET | Freq: Every day | ORAL | Status: DC
  Administered 2015-11-18 – 2015-11-22 (×5): 40 mg via ORAL
  Filled 2015-11-17 (×5): qty 1

## 2015-11-17 MED ORDER — FLUTICASONE PROPIONATE 50 MCG/ACT NA SUSP
2.0000 | Freq: Every day | NASAL | Status: DC
  Administered 2015-11-17 – 2015-11-21 (×5): 2 via NASAL
  Filled 2015-11-17: qty 16

## 2015-11-17 MED ORDER — BRIMONIDINE TARTRATE 0.2 % OP SOLN
1.0000 [drp] | Freq: Two times a day (BID) | OPHTHALMIC | Status: DC
  Administered 2015-11-17 – 2015-11-22 (×8): 1 [drp] via OPHTHALMIC
  Filled 2015-11-17 (×2): qty 5

## 2015-11-17 MED ORDER — TADALAFIL (PAH) 20 MG PO TABS
40.0000 mg | ORAL_TABLET | Freq: Every day | ORAL | Status: DC
  Administered 2015-11-17 – 2015-11-21 (×5): 40 mg via ORAL
  Filled 2015-11-17 (×5): qty 2

## 2015-11-17 MED ORDER — SODIUM CHLORIDE 0.9 % IV SOLN: 250.0000 mL | INTRAVENOUS | Status: DC | PRN

## 2015-11-17 MED ORDER — ONDANSETRON HCL 4 MG PO TABS: 4.0000 mg | ORAL_TABLET | Freq: Four times a day (QID) | ORAL | Status: DC | PRN

## 2015-11-17 MED ORDER — LATANOPROST 0.005 % OP SOLN
1.0000 [drp] | Freq: Every day | OPHTHALMIC | Status: DC
  Administered 2015-11-17 – 2015-11-21 (×5): 1 [drp] via OPHTHALMIC
  Filled 2015-11-17 (×2): qty 2.5

## 2015-11-17 MED ORDER — LIDOCAINE 5 % EX OINT
1.0000 "application " | TOPICAL_OINTMENT | Freq: Every day | CUTANEOUS | Status: DC | PRN
  Administered 2015-11-18: 1 via TOPICAL
  Filled 2015-11-17: qty 35.44

## 2015-11-17 MED ORDER — WARFARIN - PHARMACIST DOSING INPATIENT
Freq: Every day | Status: DC
  Administered 2015-11-18 – 2015-11-19 (×2)

## 2015-11-17 MED ORDER — BRINZOLAMIDE 1 % OP SUSP
1.0000 [drp] | Freq: Three times a day (TID) | OPHTHALMIC | Status: DC
  Administered 2015-11-17 – 2015-11-22 (×12): 1 [drp] via OPHTHALMIC
  Filled 2015-11-17 (×2): qty 10

## 2015-11-17 MED ORDER — AMBRISENTAN 5 MG PO TABS
5.0000 mg | ORAL_TABLET | Freq: Every day | ORAL | Status: DC
  Filled 2015-11-17: qty 1

## 2015-11-17 MED ORDER — SODIUM CHLORIDE 0.9% FLUSH: 3.0000 mL | INTRAVENOUS | Status: DC | PRN

## 2015-11-17 MED ORDER — ACETAMINOPHEN 650 MG RE SUPP: 650.0000 mg | Freq: Four times a day (QID) | RECTAL | Status: DC | PRN

## 2015-11-17 MED ORDER — AMBRISENTAN 5 MG PO TABS
5.0000 mg | ORAL_TABLET | Freq: Every day | ORAL | Status: DC
  Administered 2015-11-17 – 2015-11-21 (×5): 5 mg via ORAL
  Filled 2015-11-17 (×6): qty 1

## 2015-11-17 MED ORDER — ALPRAZOLAM 0.5 MG PO TABS
0.7500 mg | ORAL_TABLET | Freq: Every day | ORAL | Status: DC
  Administered 2015-11-17 – 2015-11-21 (×5): 0.75 mg via ORAL
  Filled 2015-11-17 (×5): qty 1

## 2015-11-17 MED ORDER — ALBUTEROL SULFATE (2.5 MG/3ML) 0.083% IN NEBU: 2.5000 mg | INHALATION_SOLUTION | RESPIRATORY_TRACT | Status: DC | PRN

## 2015-11-17 MED ORDER — HYDROCODONE-ACETAMINOPHEN 5-325 MG PO TABS
1.0000 | ORAL_TABLET | Freq: Four times a day (QID) | ORAL | Status: DC | PRN
  Administered 2015-11-17 – 2015-11-22 (×8): 1 via ORAL
  Filled 2015-11-17 (×8): qty 1

## 2015-11-17 MED ORDER — ATORVASTATIN CALCIUM 40 MG PO TABS
40.0000 mg | ORAL_TABLET | Freq: Every day | ORAL | Status: DC
  Administered 2015-11-17 – 2015-11-21 (×5): 40 mg via ORAL
  Filled 2015-11-17 (×6): qty 1

## 2015-11-17 MED ORDER — SODIUM CHLORIDE 0.9% FLUSH
3.0000 mL | Freq: Two times a day (BID) | INTRAVENOUS | Status: DC
  Administered 2015-11-18 – 2015-11-22 (×10): 3 mL via INTRAVENOUS

## 2015-11-17 MED ORDER — AMBRISENTAN 5 MG PO TABS: 5.0000 mg | ORAL_TABLET | Freq: Every day | ORAL | Status: DC

## 2015-11-17 MED ORDER — LOSARTAN POTASSIUM 50 MG PO TABS
100.0000 mg | ORAL_TABLET | Freq: Every day | ORAL | Status: DC
  Administered 2015-11-17 – 2015-11-20 (×4): 100 mg via ORAL
  Filled 2015-11-17 (×5): qty 2

## 2015-11-17 MED ORDER — NITROGLYCERIN 0.4 MG SL SUBL: 0.4000 mg | SUBLINGUAL_TABLET | SUBLINGUAL | Status: DC | PRN

## 2015-11-17 MED ORDER — WARFARIN SODIUM 3 MG PO TABS
6.0000 mg | ORAL_TABLET | Freq: Once | ORAL | Status: AC
  Administered 2015-11-17: 6 mg via ORAL
  Filled 2015-11-17: qty 2

## 2015-11-17 MED ORDER — FLUTICASONE PROPIONATE 50 MCG/ACT NA SUSP
2.0000 | Freq: Every day | NASAL | Status: DC
  Filled 2015-11-17: qty 16

## 2015-11-17 MED ORDER — COQ10 50 MG PO CAPS: 2.0000 | ORAL_CAPSULE | Freq: Every day | ORAL | Status: DC

## 2015-11-17 NOTE — ED Notes (Signed)
Pt here for increased SOB and leg swelling. sts recently started on a new med for her lungs. Pt leaking from RLE. sts she has been taking lasix and getting worse.

## 2015-11-17 NOTE — Progress Notes (Signed)
ANTICOAGULATION CONSULT NOTE - Initial Consult  Pharmacy Consult for Coumadin Indication: atrial fibrillation  No Known Allergies  Patient Measurements: Height: 5\' 3"  (160 cm) Weight: 236 lb 3.2 oz (107.14 kg) (scale b) IBW/kg (Calculated) : 52.4  Vital Signs: Temp: 98 F (36.7 C) (03/31 1919) Temp Source: Oral (03/31 1919) BP: 89/65 mmHg (03/31 1919) Pulse Rate: 60 (03/31 1919)  Labs:  Recent Labs  11/17/15 1010 11/17/15 1501 11/17/15 1528  HGB  --  11.3*  --   HCT  --  34.5*  --   PLT  --  134*  --   LABPROT  --   --  20.9*  INR  --   --  1.80*  CREATININE 1.05* 1.03*  --     Estimated Creatinine Clearance: 52.8 mL/min (by C-G formula based on Cr of 1.03).   Medical History: Past Medical History  Diagnosis Date  . Essential hypertension   . Hyperlipidemia   . Chronic atrial fibrillation ([place])     Since 2011  . CAD (coronary artery disease)     BMS to LAD 07/2005  . Degenerative arthritis of right knee   . Morbid obesity ([place])   . Psoriasis   . Right ventricular dysfunction   . Tricuspid regurgitation     Severe  . Pulmonary hypertension (HCC)     Mixed, PASP 67 mmHg at [facility] 2013  . GERD (gastroesophageal reflux disease)   . A-fib [facility])     Assessment: 70 yof admitted 11/17/2015 . Pt on coumadin 5mg  daily PTA for AFib. Last dose was 3/30. Pharmacy consulted to continue inpatient dosing. Hgb 11.3, plts 134. INR on admit is slightly low at 1.8. No s/s of bleed.  Goal of Therapy:  INR 2-3 Monitor platelets by anticoagulation protocol: Yes   Plan:  Give coumadin 6mg  PO x 1 tonight Monitor daily INR, CBC, s/s of bleed  Elenor Quinones, PharmD, BCPS Clinical Pharmacist Pager [phone] 11/17/2015 7:25 PM

## 2015-11-17 NOTE — ED Provider Notes (Addendum)
CSN: [id number]     Arrival date & time 11/17/15  1439 History   First MD Initiated Contact with Patient 11/17/15 1627     Chief Complaint  Patient presents with  . Shortness of Breath  . Leg Swelling     (Consider location/radiation/quality/duration/timing/severity/associated sxs/prior Treatment) Patient is a 81 y.o. female presenting with shortness of breath. The history is provided by the patient and the spouse.  Shortness of Breath Severity:  Severe Onset quality:  Gradual Duration:  3 days Timing:  Constant Progression:  Worsening Chronicity:  New Context: activity   Relieved by:  Rest and sitting up Worsened by:  Exertion (Patient states normally she can move around her house and do all of her cleaning and laundry and now she can barely walk across a room without becoming winded) Ineffective treatments:  Diuretics Associated symptoms: no abdominal pain, no chest pain, no cough, no diaphoresis, no fever, no sputum production, no vomiting and no wheezing   Associated symptoms comment:  Lower extremity edema Risk factors: obesity   Risk factors: no recent alcohol use, no prolonged immobilization, no recent surgery and no tobacco use   Risk factors comment:  Patient recently started on 2 new medications for her pulmonary hypertension approximate 2 weeks ago   Past Medical History  Diagnosis Date  . Essential hypertension   . Hyperlipidemia   . Chronic atrial fibrillation ([place])     Since 2011  . CAD (coronary artery disease)     BMS to LAD 07/2005  . Degenerative arthritis of right knee   . Morbid obesity ([place])   . Psoriasis   . Right ventricular dysfunction   . Tricuspid regurgitation     Severe  . Pulmonary hypertension (HCC)     Mixed, PASP 67 mmHg at [facility] 2013  . GERD (gastroesophageal reflux disease)   . A-fib [facility])    Past Surgical History  Procedure Laterality Date  . Cholecystectomy    . Ankle surgery Right   . Colonoscopy    . Carpal tunnel release   11/29/2011    Procedure: CARPAL TUNNEL RELEASE;  Surgeon: Cammie Sickle., MD;  Location: [place];  Service: Orthopedics;  Laterality: Right;   Family History  Problem Relation Age of Onset  . Heart attack Mother   . Coronary artery disease Father   . Stroke Father   . Heart attack Brother   . Heart failure Brother   . COPD Brother   . Emphysema Sister   . Emphysema Brother    Social History  Substance Use Topics  . Smoking status: Former Smoker -- 0.30 packs/day for .5 years    Types: Cigarettes    Quit date: 08/19/1986  . Smokeless tobacco: Never Used     Comment: pt only smoked for only a few months.  . Alcohol Use: No   OB History    No data available     Review of Systems  Constitutional: Negative for fever and diaphoresis.  Respiratory: Positive for shortness of breath. Negative for cough, sputum production and wheezing.   Cardiovascular: Negative for chest pain.  Gastrointestinal: Negative for vomiting and abdominal pain.  All other systems reviewed and are negative.     Allergies  Review of patient's allergies indicates no known allergies.  Home Medications   Prior to Admission medications   Medication Sig Start Date End Date Taking? Authorizing Provider  ALPRAZolam Duanne Moron) 0.5 MG tablet Take 0.75 mg by mouth at bedtime as needed  for sleep.     Historical Provider, MD  ambrisentan (LETAIRIS) 5 MG tablet Take 5 mg by mouth daily.    Historical Provider, MD  aspirin 81 MG tablet Take 81 mg by mouth daily.      Historical Provider, MD  atorvastatin (LIPITOR) 80 MG tablet TAKE 1/2 TABLET BY MOUTH DAILY 08/07/15   Satira Sark, MD  AZOPT 1 % ophthalmic suspension Place 1 drop into both eyes 3 (three) times daily.  05/02/12   Historical Provider, MD  brimonidine (ALPHAGAN) 0.2 % ophthalmic solution Place 1 drop into both eyes 2 (two) times daily.  04/21/15   Historical Provider, MD  cetirizine (ZYRTEC) 10 MG tablet Take 10 mg by mouth daily.     Historical Provider, MD  Coenzyme Q10 (COQ10) 50 MG CAPS Take 2 tablets by mouth daily.      Historical Provider, MD  fluticasone (FLONASE) 50 MCG/ACT nasal spray Place 2 sprays into both nostrils daily.  05/29/15   Historical Provider, MD  furosemide (LASIX) 20 MG tablet Take 20 mg by mouth daily.    Historical Provider, MD  HYDROcodone-acetaminophen (NORCO/VICODIN) 5-325 MG tablet Take 1 tablet by mouth every 4 (four) hours as needed.  03/26/15   Historical Provider, MD  latanoprost (XALATAN) 0.005 % ophthalmic solution Place 1 drop into the right eye at bedtime.  03/30/12   Historical Provider, MD  lidocaine (XYLOCAINE) 5 % ointment Apply 1 application topically daily as needed (boby pain).  04/10/15   Historical Provider, MD  losartan (COZAAR) 100 MG tablet TAKE 1 TABLET BY MOUTH EVERY DAY 08/07/15   Satira Sark, MD  nitroGLYCERIN (NITROSTAT) 0.4 MG SL tablet Place 0.4 mg under the tongue every 5 (five) minutes as needed for chest pain (x 3 tablets daily).    Historical Provider, MD  potassium chloride SA (K-DUR,KLOR-CON) 20 MEQ tablet Take 20 mEq by mouth daily.     Historical Provider, MD  Tadalafil, PAH, (ADCIRCA) 20 MG TABS Take 2 tablets (40 mg total) by mouth daily. 10/27/15   Collene Gobble, MD  warfarin (COUMADIN) 5 MG tablet Take 1 tablet daily except 1 1/2 tablets on Wednesdays and Saturdays 04/27/15   Carlena Bjornstad, MD   BP 124/57 mmHg  Pulse 58  Temp(Src) 98.1 F (36.7 C) (Oral)  Resp 18  Wt 239 lb 3.2 oz (108.5 kg)  SpO2 100% Physical Exam  Constitutional: She is oriented to person, place, and time. She appears well-developed and well-nourished. No distress.  HENT:  Head: Normocephalic and atraumatic.  Mouth/Throat: Oropharynx is clear and moist.  Eyes: Conjunctivae and EOM are normal. Pupils are equal, round, and reactive to light.  Neck: Normal range of motion. Neck supple.  Cardiovascular: Normal rate and intact distal pulses.  An irregularly irregular rhythm present.   No murmur heard. Pulmonary/Chest: Effort normal. No respiratory distress. She has decreased breath sounds in the right lower field and the left lower field. She has no wheezes. She has no rales.  Abdominal: Soft. She exhibits no distension. There is no tenderness. There is no rebound and no guarding.  Musculoskeletal: Normal range of motion. She exhibits edema. She exhibits no tenderness.  2+ pitting edema in bilateral lower extremities. Small area on the right shin that appears to be a bruise and mild bleeding without evidence of a ulcer  Neurological: She is alert and oriented to person, place, and time.  Skin: Skin is warm and dry. No rash noted. No erythema.  Psychiatric: She  has a normal mood and affect. Her behavior is normal.  Nursing note and vitals reviewed.   ED Course  Procedures (including critical care time) Labs Review Labs Reviewed  BASIC METABOLIC PANEL - Abnormal; Notable for the following:    Chloride 100 (*)    Glucose, Bld 103 (*)    BUN 29 (*)    Creatinine, Ser 1.03 (*)    GFR calc non Af Amer 51 (*)    GFR calc Af Amer 59 (*)    All other components within normal limits  CBC - Abnormal; Notable for the following:    RBC 3.72 (*)    Hemoglobin 11.3 (*)    HCT 34.5 (*)    Platelets 134 (*)    All other components within normal limits  BRAIN NATRIURETIC PEPTIDE - Abnormal; Notable for the following:    B Natriuretic Peptide 270.0 (*)    All other components within normal limits  I-STAT TROPOININ, ED    Imaging Review Dg Chest 2 View  11/17/2015  CLINICAL DATA:  Shortness of breath, cough EXAM: CHEST  2 VIEW COMPARISON:  10/03/2015 and 05/29/2015 FINDINGS: Cardiomegaly again noted. Central mild vascular congestion without convincing pulmonary edema. Left upper lobe nodule again noted measures 9 mm without change from prior exam. No segmental infiltrate. Osteopenia and mild degenerative change thoracic spine IMPRESSION: Cardiomegaly. Central vascular  congestion without pulmonary edema. Stable nodule in left upper lobe. Electronically Signed   By: Lahoma Crocker M.D.   On: 11/17/2015 15:28   I have personally reviewed and evaluated these images and lab results as part of my medical decision-making.   EKG Interpretation   Date/Time:  Friday November 17 2015 14:53:26 EDT Ventricular Rate:  62 PR Interval:    QRS Duration: 92 QT Interval:  418 QTC Calculation: 424 R Axis:   -23 Text Interpretation:  Atrial fibrillation Low voltage QRS Incomplete right  bundle branch block Cannot rule out Anterior infarct , age undetermined  Abnormal ECG Confirmed by Jeneen Rinks  MD, [place] ([id number]) on 11/17/2015 2:55:11 PM      MDM   Final diagnoses:  Pulmonary hypertension ([place])  Acute congestive heart failure, unspecified congestive heart failure type [facility])    Patient is a 81 year old female with a history of pulmonary hypertension, atrial fibrillation on Coumadin who takes 40 mg of Lasix 3 times a day presenting today with a three-day history of worsening shortness of breath with exertion, leg swelling in approximately a 7 pound weight gain. Patient has continued to take her Lasix and denies any increased salt intake but symptoms seem to be worsening. She denies any chest pain. Here patient sats were in the low 90s upon arrival on room air with improvement in her symptoms on 2 L nasal cannula.  Patient denies any palpitations, fever, cough. She does note that she recently started 2 new medications from her pulmonologist 2 weeks ago for the pulmonary hypertension.  On exam patient has atrial fibrillation and evidence of fluid overload. Mild decreased breath sounds bilaterally in the lower lobes which could be due to body habitus. Pitting edema in the lower extremities in no acute distress. Given patient has failed outpatient management of her fluid overload given she has been taking her Lasix without improvement will give IV Lasix. There is no evidence of renal failure  today for anemia. Question if the patient's new medications are the cause of her CHF. BNP today is 270 without an old to compare.  Last echo was in  January of this ear which showed an EF of 55-60% and severe pulmonary hypertension.  5:29 PM Spoke with Dr. Marlou Porch and he feels most likely patient's symptoms are related to her severe pulmonary hypertension. Recommended admission to the hospitalist with a pulmonology can also be involved  Blanchie Dessert, MD 11/17/15 [place], MD 11/17/15 [id number]

## 2015-11-17 NOTE — H&P (Signed)
PATIENT DETAILS Name: [name] Age: 81 y.o. Sex: female Date of Birth: 1934-12-03 Admit Date: 11/17/2015 [id number] J., MD Referring Physician: Dr. Maryan Rued   CHIEF COMPLAINT:  Worsening lower extremity edema for approximately 10 days Worsening exertional dyspnea since yesterday  HPI: [name] is a 81 y.o. female with a Past Medical History of known pulmonary hypertension and recently started on tadalafil and Letaris, atrial fibrillation on Coumadin, hypertension, history of CAD status post bare metal stent to LAD in 2006 who presents today with the above noted complaint. Per patient, she started taking adcirca (tadafill) and Letaris (ambrisentan) approximately 2 weeks back, since then she has had slowly worsening lower extremity edema. Edema has really got worse over the past few days, she has now having trouble putting on socks and putting on her shoes. Since yesterday her chronic exertional dyspnea has worsened significantly, even walking across the room makes a now short of breath. She has chronic orthopnea, but denies PND. She subsequently presented to the ED for further evaluation. I was asked to admit this patient Patient denies any fever, chest pain, cough. She denies any history of nausea, vomiting or diarrhea.   ALLERGIES:  No Known Allergies  PAST MEDICAL HISTORY: Past Medical History  Diagnosis Date  . Essential hypertension   . Hyperlipidemia   . Chronic atrial fibrillation ([place])     Since 2011  . CAD (coronary artery disease)     BMS to LAD 07/2005  . Degenerative arthritis of right knee   . Morbid obesity ([place])   . Psoriasis   . Right ventricular dysfunction   . Tricuspid regurgitation     Severe  . Pulmonary hypertension (HCC)     Mixed, PASP 67 mmHg at [facility] 2013  . GERD (gastroesophageal reflux disease)   . A-fib [facility])     PAST SURGICAL HISTORY: Past Surgical History  Procedure Laterality Date  .  Cholecystectomy    . Ankle surgery Right   . Colonoscopy    . Carpal tunnel release  11/29/2011    Procedure: CARPAL TUNNEL RELEASE;  Surgeon: Cammie Sickle., MD;  Location: [facility];  Service: Orthopedics;  Laterality: Right;    MEDICATIONS AT HOME: Prior to Admission medications   Medication Sig Start Date End Date Taking? Authorizing Provider  ALPRAZolam Duanne Moron) 0.5 MG tablet Take 0.75 mg by mouth at bedtime as needed for sleep.     Historical Provider, MD  ambrisentan (LETAIRIS) 5 MG tablet Take 5 mg by mouth daily.    Historical Provider, MD  aspirin 81 MG tablet Take 81 mg by mouth daily.      Historical Provider, MD  atorvastatin (LIPITOR) 80 MG tablet TAKE 1/2 TABLET BY MOUTH DAILY 08/07/15   Satira Sark, MD  AZOPT 1 % ophthalmic suspension Place 1 drop into both eyes 3 (three) times daily.  05/02/12   Historical Provider, MD  brimonidine (ALPHAGAN) 0.2 % ophthalmic solution Place 1 drop into both eyes 2 (two) times daily.  04/21/15   Historical Provider, MD  Coenzyme Q10 (COQ10) 50 MG CAPS Take 2 tablets by mouth daily.      Historical Provider, MD  fluticasone (FLONASE) 50 MCG/ACT nasal spray Place 2 sprays into both nostrils daily.  05/29/15   Historical Provider, MD  HYDROcodone-acetaminophen (NORCO/VICODIN) 5-325 MG tablet Take 1 tablet by mouth every 4 (four) hours as needed.  03/26/15   Historical Provider, MD  latanoprost Ivin Poot)  0.005 % ophthalmic solution Place 1 drop into the right eye at bedtime.  03/30/12   Historical Provider, MD  lidocaine (XYLOCAINE) 5 % ointment Apply 1 application topically daily as needed (boby pain).  04/10/15   Historical Provider, MD  losartan (COZAAR) 100 MG tablet TAKE 1 TABLET BY MOUTH EVERY DAY 08/07/15   Satira Sark, MD  nitroGLYCERIN (NITROSTAT) 0.4 MG SL tablet Place 0.4 mg under the tongue every 5 (five) minutes as needed for chest pain (x 3 tablets daily).    Historical Provider, MD  potassium chloride SA  (K-DUR,KLOR-CON) 20 MEQ tablet Take 20 mEq by mouth daily.     Historical Provider, MD  Tadalafil, PAH, (ADCIRCA) 20 MG TABS Take 2 tablets (40 mg total) by mouth daily. 10/27/15   Collene Gobble, MD  warfarin (COUMADIN) 5 MG tablet Take 1 tablet daily except 1 1/2 tablets on Wednesdays and Saturdays 04/27/15   Carlena Bjornstad, MD    FAMILY HISTORY: Family History  Problem Relation Age of Onset  . Heart attack Mother   . Coronary artery disease Father   . Stroke Father   . Heart attack Brother   . Heart failure Brother   . COPD Brother   . Emphysema Sister   . Emphysema Brother     SOCIAL HISTORY:  reports that she quit smoking about 29 years ago. Her smoking use included Cigarettes. She has a .15 pack-year smoking history. She has never used smokeless tobacco. She reports that she does not drink alcohol or use illicit drugs. Lives at: Home Mobility:  Independent  REVIEW OF SYSTEMS:  Constitutional:   No  weight loss, night sweats,  Fevers, chills, fatigue.  HEENT:    No headaches, Dysphagia,Tooth/dental problems,Sore throat,  No sneezing, itching, ear ache, nasal congestion, post nasal drip  Cardio-vascular: No chest pain, palpitations  GI:  No heartburn, indigestion, abdominal pain, nausea, vomiting, diarrhea, melena or hematochezia  Resp: No  cough, hemoptysis,plueritic chest pain.   Skin:  No rash or lesions.  GU:  No dysuria, change in color of urine, no urgency or frequency.  No flank pain.  Musculoskeletal: No joint pain or swelling.  No decreased range of motion.  No back pain.  Endocrine: No heat intolerance, no cold intolerance, no polyuria, no polydipsia  Psych: No change in mood or affect. No depression or anxiety.  No memory loss.   PHYSICAL EXAM: Blood pressure 124/57, pulse 58, temperature 98.1 F (36.7 C), temperature source Oral, resp. rate 18, weight 108.5 kg (239 lb 3.2 oz), SpO2 100 %.  General appearance :Awake, alert, not in any distress.  Speech Clear. Not toxic Looking HEENT: Atraumatic and Normocephalic, pupils equally reactive to light and accomodation Neck: supple, + JVD. No cervical lymphadenopathy.  Chest:Good air entry bilaterally, no added sounds  CVS: S1 S2 regular, no murmurs.  Abdomen: Bowel sounds present, Non tender and not distended with no gaurding, rigidity or rebound. Extremities: B/L Lower Ext shows ++ edema, both legs are warm to touch Neurology:  Non focal Skin:No Rash Wounds:N/A  LABS ON ADMISSION:   Recent Labs  11/17/15 1010 11/17/15 1501  NA 138 139  K 3.3* 3.5  CL 99* 100*  CO2 29 28  GLUCOSE 88 103*  BUN 33* 29*  CREATININE 1.05* 1.03*  CALCIUM 9.1 9.7   No results for input(s): AST, ALT, ALKPHOS, BILITOT, PROT, ALBUMIN in the last 72 hours. No results for input(s): LIPASE, AMYLASE in the last 72 hours.  Recent  Labs  11/17/15 1501  WBC 5.0  HGB 11.3*  HCT 34.5*  MCV 92.7  PLT 134*   No results for input(s): CKTOTAL, CKMB, CKMBINDEX, TROPONINI in the last 72 hours. No results for input(s): DDIMER in the last 72 hours. Invalid input(s): POCBNP   RADIOLOGIC STUDIES ON ADMISSION: Dg Chest 2 View  11/17/2015  CLINICAL DATA:  Shortness of breath, cough EXAM: CHEST  2 VIEW COMPARISON:  10/03/2015 and 05/29/2015 FINDINGS: Cardiomegaly again noted. Central mild vascular congestion without convincing pulmonary edema. Left upper lobe nodule again noted measures 9 mm without change from prior exam. No segmental infiltrate. Osteopenia and mild degenerative change thoracic spine IMPRESSION: Cardiomegaly. Central vascular congestion without pulmonary edema. Stable nodule in left upper lobe. Electronically Signed   By: Lahoma Crocker M.D.   On: 11/17/2015 15:28    I have personally reviewed images of chest xray    EKG: Personally reviewed. A. fib-low voltage.  ASSESSMENT AND PLAN: Present on Admission:  . Acute right and left-sided heart failure/acute diastolic heart failure: Presents with  mostly worsening lower extremity edema, although has some exertional dyspnea, her symptomatology is more consistent with acute right-sided heart failure. Start IV Lasix, we will continue tadalafil and ambrisentan as recommended by PCCM (see Dr. Agustina Caroli note). We will need repeat 2-D echocardiogram and probably cardiology/CHF team consultation as well. Follow weights, strict intake output. Follow clinical course.  [name] History of pulmonary hypertension: Followed by Dr. Ellouise Newer on  tadalafil and ambrisentan on mid-March. Repeat echocardiogram. Likely will need evaluation by CHF team and pulmonology in the next few days.  . Essential hypertension: Continue losartan.   . Pulmonary nodules: Continue outpatient follow-up with pulmonology   . Atrial fibrillation: Rate controlled, continue Coumadin per pharmacy.   [name] GERD: Continue PPI.   [name] Hyperlipidemia: Continue statin.   [name] CAD (coronary artery disease): Continue aspirin and statin.  Further plan will depend as patient's clinical course evolves and further radiologic and laboratory data become available. Patient will be monitored closely.  Above noted plan was discussed with patient/spouse  face to face at bedside, they were in agreement.   CONSULTS: None  DVT Prophylaxis: Coumadin  Code Status: Full Code  Disposition Plan:  Discharge back home possibly in 2-3 days,but may warrant SNF depending on clinical course  Total time spent  55 minutes.Greater than 50% of this time was spent in counseling, explanation of diagnosis, planning of further management, and coordination of care.  [facility] Pager [phone]  If 7PM-7AM, please contact night-coverage [email or web address] Password [email or web address] 11/17/2015, 6:01 PM

## 2015-11-17 NOTE — Telephone Encounter (Signed)
Patient states that both ankles are swollen so badly she cannot get shoes on her feet.  Her legs are swollen as well.  Patient states she has SOB.  No chest tightness or chest pain, but having irregular heartbeats since taking the new medications that was prescribed by Dr. Lamonte Sakai Myrtis Hopping, Adcirca).  Patient states that her legs are burning. She said that they are warm to touch. Patients spouse says that she has not had so much fluid build up until she started these medications.  Currently Lasix 40mg  three times daily.  While talking to patient, her leg cracked and started bleeding, I advised patient to go to ER.  Patient is on her way to [facility] ER now.   Patient had bloodwork drawn today and these are the results:       Ref Range 10:10 AM  61yr ago     Sodium 135 - 145 mmol/L 138 145R    Potassium 3.5 - 5.1 mmol/L 3.3 (L) 4.0R    Chloride 101 - 111 mmol/L 99 (L) 107R    CO2 22 - 32 mmol/L 29 27R    Glucose, Bld 65 - 99 mg/dL 88 107 (H)R    BUN 6 - 20 mg/dL 33 (H) 23R    Creatinine, Ser 0.44 - 1.00 mg/dL 1.05 (H) 0.83R    Calcium 8.9 - 10.3 mg/dL 9.1 9.2R    GFR calc non Af Amer >60 mL/min 50 (L) 90 mL/min" class="[email or web address]" style="cursor: pointer;" onmouseover='jscript: var varStyle="underline"; var bgColor="#[id number]"; this.style.backgroundColor=bgColor; var children=this.getElementsByTagName("div"); for(var child=0;child 67 (L)R    GFR calc Af Amer >60 mL/min 57 (L) 90 mL/min" class="[email or web address]" style="cursor: pointer;" onmouseover='jscript: var varStyle="underline"; var bgColor="#[id number]"; this.style.backgroundColor=bgColor; var children=this.getElementsByTagName("div"); for(var child=0;child 77 (L)R, CM   Comments: (NOTE)           Can call patient's husband, Charlotte Crumb - Cell: [phone]... To follow up on patient.

## 2015-11-17 NOTE — Telephone Encounter (Signed)
Spoke with the patient and her husband. They're currently in the emergency department and they're working on some additional diuresis given her significant lower extremity edema and dyspnea. I suspect that some of this is is related to the Letaris. I also suspect that the facial flushing is due to Adcirca. Her dyspnea and her edema are somewhat difficult to interpret because certainly is possible that she's had a worsening of her pulmonary hypertension for some reason, also associated with a new medications..  She has an appointment to see me next week. She will stay on the pulmonary hypertension medications as ordered until I see her next week

## 2015-11-17 NOTE — ED Notes (Signed)
Pt sts she is on home O2 2L at night.  Pt's saturations low 90's on RA at this time.  Instructed EMT to place pt on 2L 02 [place]

## 2015-11-17 NOTE — ED Notes (Signed)
Admitting at bedside 

## 2015-11-17 NOTE — ED Notes (Signed)
MD at bedside. 

## 2015-11-18 ENCOUNTER — Inpatient Hospital Stay (HOSPITAL_COMMUNITY): Payer: Medicare Other

## 2015-11-18 DIAGNOSIS — I5031 Acute diastolic (congestive) heart failure: Secondary | ICD-10-CM

## 2015-11-18 DIAGNOSIS — I509 Heart failure, unspecified: Secondary | ICD-10-CM

## 2015-11-18 DIAGNOSIS — I10 Essential (primary) hypertension: Secondary | ICD-10-CM

## 2015-11-18 DIAGNOSIS — E785 Hyperlipidemia, unspecified: Secondary | ICD-10-CM

## 2015-11-18 DIAGNOSIS — I4891 Unspecified atrial fibrillation: Secondary | ICD-10-CM

## 2015-11-18 LAB — BASIC METABOLIC PANEL
ANION GAP: 9 (ref 5–15)
BUN: 26 mg/dL — ABNORMAL HIGH (ref 6–20)
CHLORIDE: 100 mmol/L — AB (ref 101–111)
CO2: 32 mmol/L (ref 22–32)
Calcium: 9.4 mg/dL (ref 8.9–10.3)
Creatinine, Ser: 1.1 mg/dL — ABNORMAL HIGH (ref 0.44–1.00)
GFR calc Af Amer: 54 mL/min — ABNORMAL LOW (ref >60)
GFR, EST NON AFRICAN AMERICAN: 47 mL/min — AB (ref >60)
GLUCOSE: 112 mg/dL — AB (ref 65–99)
POTASSIUM: 3.5 mmol/L (ref 3.5–5.1)
Sodium: 141 mmol/L (ref 135–145)

## 2015-11-18 LAB — CBC
HCT: 32.7 % — ABNORMAL LOW (ref 36.0–46.0)
HEMOGLOBIN: 10.5 g/dL — AB (ref 12.0–15.0)
MCH: 29.5 pg (ref 26.0–34.0)
MCHC: 32.1 g/dL (ref 30.0–36.0)
MCV: 91.9 fL (ref 78.0–100.0)
Platelets: 139 10*3/uL — ABNORMAL LOW (ref 150–400)
RBC: 3.56 MIL/uL — AB (ref 3.87–5.11)
RDW: 14.1 % (ref 11.5–15.5)
WBC: 4.3 10*3/uL (ref 4.0–10.5)

## 2015-11-18 LAB — ECHOCARDIOGRAM COMPLETE
HEIGHTINCHES: 63 in
Weight: 3771.2 oz

## 2015-11-18 LAB — PROTIME-INR
INR: 1.97 — ABNORMAL HIGH (ref 0.00–1.49)
Prothrombin Time: 22.3 seconds — ABNORMAL HIGH (ref 11.6–15.2)

## 2015-11-18 LAB — TROPONIN I: Troponin I: <0.03 ng/mL (ref <0.031)

## 2015-11-18 MED ORDER — WARFARIN SODIUM 5 MG PO TABS
5.0000 mg | ORAL_TABLET | Freq: Once | ORAL | Status: AC
  Administered 2015-11-18: 5 mg via ORAL
  Filled 2015-11-18: qty 1

## 2015-11-18 MED ORDER — FUROSEMIDE 10 MG/ML IJ SOLN
60.0000 mg | Freq: Two times a day (BID) | INTRAMUSCULAR | Status: DC
  Administered 2015-11-19: 60 mg via INTRAVENOUS
  Filled 2015-11-18: qty 6

## 2015-11-18 MED ORDER — TIMOLOL MALEATE 0.5 % OP SOLN
1.0000 [drp] | Freq: Two times a day (BID) | OPHTHALMIC | Status: DC
  Administered 2015-11-18 – 2015-11-22 (×6): 1 [drp] via OPHTHALMIC
  Filled 2015-11-18: qty 5

## 2015-11-18 NOTE — Progress Notes (Signed)
Notified hospitalist via text page of patient's heart rate that tends to brady down to the 30's when sleeping. Was also notified by CMT of two occurrences of 2.45 second pause in which heart rate was in the 30's and blood pressure was 88/39 as well. Awakened patient to sit at the side of bed and heart rate increased to the 50's/60's and blood pressure increased to 104/54. Awaiting return page.

## 2015-11-18 NOTE — Progress Notes (Signed)
ANTICOAGULATION CONSULT NOTE - Initial Consult  Pharmacy Consult for Coumadin Indication: atrial fibrillation  No Known Allergies  Patient Measurements: Height: 5\' 3"  (160 cm) Weight: 235 lb 11.2 oz (106.913 kg) IBW/kg (Calculated) : 52.4  Vital Signs: Temp: 98.2 F (36.8 C) (04/01 1158) Temp Source: Oral (04/01 1158) BP: 108/58 mmHg (04/01 1158) Pulse Rate: 75 (04/01 1158)  Labs:  Recent Labs  11/17/15 1010 11/17/15 1501 11/17/15 1528 11/17/15 1957 11/18/15 0229 11/18/15 0728  HGB  --  11.3*  --   --  10.5*  --   HCT  --  34.5*  --   --  32.7*  --   PLT  --  134*  --   --  139*  --   LABPROT  --   --  20.9*  --  22.3*  --   INR  --   --  1.80*  --  1.97*  --   CREATININE 1.05* 1.03*  --   --  1.10*  --   TROPONINI  --   --   --  0.03 <0.03 <0.03    Estimated Creatinine Clearance: 49.4 mL/min (by C-G formula based on Cr of 1.1).   Assessment: 63 yof admitted 11/17/2015 . Pt on coumadin 5mg  daily PTA for AFib. Last dose was 3/30. Pharmacy consulted to continue inpatient dosing. CBC stable. INR on admit is slightly low at 1.8, increased to 1.97. No s/s of bleed.  Goal of Therapy:  INR 2-3 Monitor platelets by anticoagulation protocol: Yes   Plan:  Give coumadin 5 mg PO x 1 tonight Monitor daily INR, CBC, s/s of bleed  Joya San, PharmD Clinical Pharmacy Resident Pager # [phone] 11/18/2015 1:01 PM

## 2015-11-18 NOTE — Discharge Summary (Addendum)
[name] [id number] DOB: [birth year] DOA: 11/17/2015 PCP: Brianna Herring., MD  Brief narrative:  81 y/o ? Class III [name] [facility] cath 2013 showed this]with Recent start Adcirca 40 mg daily and Letaris 5mg  daily b Dr. Lamonte [name] Chr Afib , [place] >5 CAD s/p  stent LAD 2006 Bradycardia-suppsoed to d/w Cardiology PPM [per OV 05/2015] HLD Htn Chr D-HF, Echo PASP 70  Remote Tob use Psoriasis pulm nodules OSA AHI 4.1 when last tested  Labs bening IMPRESSION: Cardiomegaly. Central vascular congestion without pulmonary edema. Stable nodule in left upper lobe  Past medical history-As per Problem list Chart reviewed as below-   Consultants:  Cardiology  Procedures:  cxr 3/31  Antibiotics:  nad   Subjective   Noted that patient ambulating well in the hallway with walker not wearing oxygen and looking very comfortable Swelling in the lower extremities has gone down Passing good urine - 2.74 L so far this admission, weight 235-->227 pounds.    Objective    Interim History:   Telemetry: Sinus--some brady overnight.  Pause 2.4   Objective: Filed Vitals:   11/18/15 0106 11/18/15 0537 11/18/15 0544 11/18/15 1158  BP: 104/54 89/54 117/61 108/58  Pulse: 62 62 63 75  Temp:  97.1 F (36.2 C)  98.2 F (36.8 C)  TempSrc:  Oral  Oral  Resp:  18  18  Height:      Weight:   106.913 kg (235 lb 11.2 oz)   SpO2:  100%  100%    Intake/Output Summary (Last 24 hours) at 11/18/15 1600 Last data filed at 11/18/15 1100  Gross per 24 hour  Intake    243 ml  Output   2200 ml  Net  -1957 ml    Exam:  EOMI NCAT S1-S2 no murmur rub or gallop No rales no rhonchi JVD Continue diuresis noted at 30 probably about 2-3 cm Abdomen soft nontender nondistended Lower extremities are wrapped and overtly seem swollen with bowel grade 1-2 lower extremity pitting edema   Data Reviewed: Basic Metabolic Panel:  Recent Labs Lab 11/17/15 1010 11/17/15 1501  11/18/15 0229  NA 138 139 141  K 3.3* 3.5 3.5  CL 99* 100* 100*  CO2 29 28 32  GLUCOSE 88 103* 112*  BUN 33* 29* 26*  CREATININE 1.05* 1.03* 1.10*  CALCIUM 9.1 9.7 9.4   Liver Function Tests: No results for input(s): AST, ALT, ALKPHOS, BILITOT, PROT, ALBUMIN in the last 168 hours. No results for input(s): LIPASE, AMYLASE in the last 168 hours. No results for input(s): AMMONIA in the last 168 hours. CBC:  Recent Labs Lab 11/17/15 1501 11/18/15 0229  WBC 5.0 4.3  HGB 11.3* 10.5*  HCT 34.5* 32.7*  MCV 92.7 91.9  PLT 134* 139*   Cardiac Enzymes:  Recent Labs Lab 11/17/15 1957 11/18/15 0229 11/18/15 0728  TROPONINI 0.03 <0.03 <0.03   BNP: Invalid input(s): POCBNP CBG: No results for input(s): GLUCAP in the last 168 hours.  No results found for this or any previous visit (from the past 240 hour(s)).   Studies:              All Imaging reviewed and is as per above notation   Scheduled Meds: . acidophilus   Oral BID  . ALPRAZolam  0.75 mg Oral QHS  . ambrisentan  5 mg Oral Daily  . aspirin EC  81 mg Oral Daily  . atorvastatin  40 mg Oral Daily  . brimonidine  1 drop Both Eyes  BID  . brinzolamide  1 drop Both Eyes TID  . fluticasone  2 spray Each Nare Daily  . [START ON 11/19/2015] furosemide  60 mg Intravenous BID  . latanoprost  1 drop Right Eye QHS  . losartan  100 mg Oral Daily  . pantoprazole  40 mg Oral Q1200  . potassium chloride  40 mEq Oral BID  . sodium chloride flush  3 mL Intravenous Q12H  . Tadalafil (PAH)  40 mg Oral Daily  . warfarin  5 mg Oral ONCE-1800  . Warfarin - Pharmacist Dosing Inpatient   Does not apply q1800   Continuous Infusions:    Assessment/Plan:  1. Decompensated heart failure/vasodilation, EF this admission 55-65%  secondary to pulmonary medications-continue , change Lasix dose 60 3 times a day to twice a day.  Repeat labs a.m--continue diuresis as PO for now 2. Arrythmia-check magnesium given diuresis.  asymtpomatic  currently-keep tele 3. Moderate to severe pulmonary hypertension class III-will need further management as an outpatient with pulmonary--will cc Dr. Lamonte [name] of pulmonology on this note-patient nearing discharge and will need close outpatient follow-up and care coordination 4. Echocardiogram this admission shows PSP 60 mmHg 5. Hypertension-continue losartan 100 daily 6. Hyperlipidemia continue atorvastatin 40 daily 7. Bipolar continue alprazolam 0.75 daily at bedtime  8. Reflux continue pantoprazole 40 daily-limited this as an outpatient if possible 9. CAD status post stent LAD 2006-aspirin 81 mg and other medications as above 10. Chronic atrial fibrillation [place] score above 5-continue Coumadin-pharmacy.  INR 1.95 11. Pulmonary nodules-outpatient screening as per low-dose CT protocol per Dr. Lamonte [name] as OP 12. History psoriasis-monitor-unclear if playing a role in her pulmonary issues 13. Obstructive sleep apnea-continue BiPAP when necessary-stable 14.  history of bradycardia-supposed to have PPM as per outpatient visit note 05/2015 cardiologist-d/w Dr. Tempie [name] AHF team-will see as OP-will schedule appt 1-2 wk--will forward d/c summary to him and Pulmonary Dr. Lamonte [name]   full [place] discharge 24-48 hours with net negative fluid balance   Brianna Griffes, MD  [facility] Pager [phone] 11/18/2015, 4:00 PM    LOS: 1 day

## 2015-11-18 NOTE — Progress Notes (Signed)
SATURATION QUALIFICATIONS:  Patient Saturations on Room Air at Rest = 95%  Patient Saturations on Room Air while Ambulating = 95%  (Pt walked almost 100 ft, without having difficulty in breathing, no SOB and distress noted, tolerated well)

## 2015-11-18 NOTE — Progress Notes (Signed)
Orders given for EKG the next time patient brady's down to the thirties. For informational purposes, heart rate seems to brady down when patient is sleeping and also experiences pauses when it bradys down. Will notify oncoming nurse and continue to monitor patient to end of shift.

## 2015-11-18 NOTE — Progress Notes (Signed)
Patient refused CPAP tonight. There Isn't a machine in the room at this time. RN aware. States she only wear Nasal Cannula.

## 2015-11-18 NOTE — Progress Notes (Signed)
  Echocardiogram 2D Echocardiogram has been performed.  [name] 11/18/2015, 2:45 PM

## 2015-11-18 NOTE — Progress Notes (Signed)
Please note at the beginning of the shift, patient had one soft stool and two loose stools per the patient. Notified hospitalist, but until after three loose stools, will the doctor put in an order for C-Diff. Currently patient has had no more loose stools at this time but will continue to monitor for any more loose stools to end of shift.

## 2015-11-18 NOTE — Progress Notes (Signed)
During the night, patient's heart rate continues to drop to the low 30's while patient is asleep. When awaken it will increase up to 50-60bpm. Patient has had a few pauses that were 2.45 and the last pause noted is around 2.8sec. Notified hospitalist for informational purposes.Patient continues to be asymptomatic. Will continue to monitor patient to end of shift.

## 2015-11-19 DIAGNOSIS — I509 Heart failure, unspecified: Secondary | ICD-10-CM

## 2015-11-19 LAB — COMPREHENSIVE METABOLIC PANEL
ALT: 16 U/L (ref 14–54)
AST: 26 U/L (ref 15–41)
Albumin: 3.5 g/dL (ref 3.5–5.0)
Alkaline Phosphatase: 51 U/L (ref 38–126)
Anion gap: 9 (ref 5–15)
BUN: 27 mg/dL — AB (ref 6–20)
CHLORIDE: 103 mmol/L (ref 101–111)
CO2: 31 mmol/L (ref 22–32)
CREATININE: 1.16 mg/dL — AB (ref 0.44–1.00)
Calcium: 9.6 mg/dL (ref 8.9–10.3)
GFR calc non Af Amer: 44 mL/min — ABNORMAL LOW (ref >60)
GFR, EST AFRICAN AMERICAN: 51 mL/min — AB (ref >60)
Glucose, Bld: 92 mg/dL (ref 65–99)
POTASSIUM: 4 mmol/L (ref 3.5–5.1)
SODIUM: 143 mmol/L (ref 135–145)
Total Bilirubin: 0.7 mg/dL (ref 0.3–1.2)
Total Protein: 6.2 g/dL — ABNORMAL LOW (ref 6.5–8.1)

## 2015-11-19 LAB — PROTIME-INR
INR: 1.95 — ABNORMAL HIGH (ref 0.00–1.49)
PROTHROMBIN TIME: 22.1 s — AB (ref 11.6–15.2)

## 2015-11-19 LAB — CBC
HCT: 33.9 % — ABNORMAL LOW (ref 36.0–46.0)
Hemoglobin: 10.5 g/dL — ABNORMAL LOW (ref 12.0–15.0)
MCH: 29 pg (ref 26.0–34.0)
MCHC: 31 g/dL (ref 30.0–36.0)
MCV: 93.6 fL (ref 78.0–100.0)
PLATELETS: 136 10*3/uL — AB (ref 150–400)
RBC: 3.62 MIL/uL — AB (ref 3.87–5.11)
RDW: 14.4 % (ref 11.5–15.5)
WBC: 4 10*3/uL (ref 4.0–10.5)

## 2015-11-19 MED ORDER — WARFARIN SODIUM 5 MG PO TABS
5.0000 mg | ORAL_TABLET | Freq: Once | ORAL | Status: AC
  Administered 2015-11-19: 5 mg via ORAL
  Filled 2015-11-19: qty 1

## 2015-11-19 MED ORDER — FUROSEMIDE 80 MG PO TABS: 80.0000 mg | ORAL_TABLET | Freq: Two times a day (BID) | ORAL | Status: DC

## 2015-11-19 MED ORDER — FUROSEMIDE 80 MG PO TABS
80.0000 mg | ORAL_TABLET | Freq: Two times a day (BID) | ORAL | Status: DC
  Administered 2015-11-19 – 2015-11-20 (×2): 80 mg via ORAL
  Filled 2015-11-19 (×2): qty 1

## 2015-11-19 NOTE — Progress Notes (Signed)
ANTICOAGULATION CONSULT NOTE - Initial Consult  Pharmacy Consult for Coumadin Indication: atrial fibrillation  No Known Allergies  Patient Measurements: Height: 5\' 3"  (160 cm) Weight: 227 lb 11.2 oz (103.284 kg) (scale b) IBW/kg (Calculated) : 52.4  Vital Signs: Temp: 97.9 F (36.6 C) (04/02 1218) Temp Source: Oral (04/02 1218) BP: 100/46 mmHg (04/02 1218) Pulse Rate: 61 (04/02 1218)  Labs:  Recent Labs  11/17/15 1501 11/17/15 1528 11/17/15 1957 11/18/15 0229 11/18/15 0728 11/19/15 0401  HGB 11.3*  --   --  10.5*  --  10.5*  HCT 34.5*  --   --  32.7*  --  33.9*  PLT 134*  --   --  139*  --  136*  LABPROT  --  20.9*  --  22.3*  --  22.1*  INR  --  1.80*  --  1.97*  --  1.95*  CREATININE 1.03*  --   --  1.10*  --  1.16*  TROPONINI  --   --  0.03 <0.03 <0.03  --     Estimated Creatinine Clearance: 45.9 mL/min (by C-G formula based on Cr of 1.16).   Assessment: 48 yof admitted 11/17/2015 . Pt on coumadin 5mg  daily PTA for AFib. Last dose PTA was 3/30. Pharmacy consulted to continue inpatient dosing. CBC stable. INR remains slightly subtherapeutic. No s/s of bleed.  Goal of Therapy:  INR 2-3 Monitor platelets by anticoagulation protocol: Yes   Plan:  Give coumadin 5 mg PO x 1 tonight Monitor daily INR, CBC, s/s of bleed  Joya San, PharmD Clinical Pharmacy Resident Pager # [phone] 11/19/2015 1:43 PM

## 2015-11-20 ENCOUNTER — Inpatient Hospital Stay (HOSPITAL_COMMUNITY): Payer: Medicare Other

## 2015-11-20 DIAGNOSIS — I2510 Atherosclerotic heart disease of native coronary artery without angina pectoris: Secondary | ICD-10-CM

## 2015-11-20 DIAGNOSIS — I482 Chronic atrial fibrillation: Secondary | ICD-10-CM

## 2015-11-20 DIAGNOSIS — I5033 Acute on chronic diastolic (congestive) heart failure: Secondary | ICD-10-CM

## 2015-11-20 DIAGNOSIS — I272 Other secondary pulmonary hypertension: Secondary | ICD-10-CM

## 2015-11-20 LAB — CBC
HCT: 33.4 % — ABNORMAL LOW (ref 36.0–46.0)
Hemoglobin: 10.8 g/dL — ABNORMAL LOW (ref 12.0–15.0)
MCH: 30.7 pg (ref 26.0–34.0)
MCHC: 32.3 g/dL (ref 30.0–36.0)
MCV: 94.9 fL (ref 78.0–100.0)
PLATELETS: 130 10*3/uL — AB (ref 150–400)
RBC: 3.52 MIL/uL — AB (ref 3.87–5.11)
RDW: 14.7 % (ref 11.5–15.5)
WBC: 4.2 10*3/uL (ref 4.0–10.5)

## 2015-11-20 LAB — BASIC METABOLIC PANEL
ANION GAP: 6 (ref 5–15)
BUN: 28 mg/dL — AB (ref 6–20)
CO2: 32 mmol/L (ref 22–32)
Calcium: 9.7 mg/dL (ref 8.9–10.3)
Chloride: 105 mmol/L (ref 101–111)
Creatinine, Ser: 1.18 mg/dL — ABNORMAL HIGH (ref 0.44–1.00)
GFR, EST AFRICAN AMERICAN: 50 mL/min — AB (ref >60)
GFR, EST NON AFRICAN AMERICAN: 43 mL/min — AB (ref >60)
Glucose, Bld: 92 mg/dL (ref 65–99)
POTASSIUM: 4.4 mmol/L (ref 3.5–5.1)
SODIUM: 143 mmol/L (ref 135–145)

## 2015-11-20 LAB — PROTIME-INR
INR: 2.03 — AB (ref 0.00–1.49)
PROTHROMBIN TIME: 22.9 s — AB (ref 11.6–15.2)

## 2015-11-20 MED ORDER — TECHNETIUM TC 99M DIETHYLENETRIAME-PENTAACETIC ACID: 31.0000 | Freq: Once | INTRAVENOUS | Status: DC | PRN

## 2015-11-20 MED ORDER — FUROSEMIDE 10 MG/ML IJ SOLN
80.0000 mg | Freq: Once | INTRAMUSCULAR | Status: AC
  Administered 2015-11-20: 80 mg via INTRAVENOUS
  Filled 2015-11-20: qty 8

## 2015-11-20 MED ORDER — LOSARTAN POTASSIUM 25 MG PO TABS: 25.0000 mg | ORAL_TABLET | Freq: Every day | ORAL | Status: DC

## 2015-11-20 MED ORDER — WARFARIN SODIUM 5 MG PO TABS
5.0000 mg | ORAL_TABLET | Freq: Every day | ORAL | Status: DC
  Administered 2015-11-20 – 2015-11-21 (×2): 5 mg via ORAL
  Filled 2015-11-20 (×2): qty 1

## 2015-11-20 MED ORDER — TECHNETIUM TO 99M ALBUMIN AGGREGATED: 4.0000 | Freq: Once | INTRAVENOUS | Status: DC | PRN

## 2015-11-20 MED ORDER — FUROSEMIDE 10 MG/ML IJ SOLN
80.0000 mg | Freq: Two times a day (BID) | INTRAMUSCULAR | Status: DC
  Administered 2015-11-20 – 2015-11-22 (×4): 80 mg via INTRAVENOUS
  Filled 2015-11-20 (×4): qty 8

## 2015-11-20 NOTE — Consult Note (Signed)
Advanced Heart Failure Team Consult Note  Referring Physician: Dr Verlon Au Primary Physician: Dr Gerarda Fraction  Primary Cardiologist: Dr Domenic Polite  Pulmonology: Dr Lamonte Sakai.   Reason for Consultation: Pulmonary HTN   HPI:    [name] is a 81 year old with history of pulmonary HTN on letaris and tadalafil, chronic A fib on coumadin, HTN, CAD  BMS LAD 2006, obesity, psoriasis, bradycardia, and severe TR admitted with increased dyspnea on exertion and lower extremity edema. Sleep test negative but does use oxygen at night.   In October 2016 she was evaluated by cardiology for bradycardia. At that point PPM was not pursed.   She was evaluated by Dr Lamonte Sakai February 20th and started on [place]. Once approved she started letaris and adcirca about 2 weeks ago. Since then she has had increased leg edema.   She presented to [facility] ED with increased dyspnea. Started on IV lasix. Weights are all over the place but seem to be coming down. Had episodes of bradycardia (HR down to 30s) on 11/18/2015. No BRBPR.   Ongoing dyspnea with exertion. Denies presyncope/syncope.   ECHO 11/18/2015- EF 55-60% Peak PA pressure D shaped septum.   CT of chest 2017 Pulmonary Nodules stable compared to 2015.   [place] 2013 RA = 8 RV = 73/9/10 PA = 67/21 (40) PCW = 14 (v = 21) Fick cardiac output/index = 4.7/2.1 PVR = 5.6 [place] units FA sat = 94% PA sat = 61%, 61%  2012 VQ Scan low probability PE     Review of Systems: [y] = yes, [ ]  = no   General: Weight gain [ ] ; Weight loss [ ] ; Anorexia [ ] ; Fatigue [ ] ; Fever [ ] ; Chills [ ] ; Weakness [Y ]  Cardiac: Chest pain/pressure [ ] ; Resting SOB [ ] ; Exertional SOB [Y ]; Orthopnea [ ] ; Pedal Edema [ Y]; Palpitations [ ] ; Syncope [ ] ; Presyncope [ ] ; Paroxysmal nocturnal dyspnea[ ]   Pulmonary: Cough [ ] ; Wheezing[ ] ; Hemoptysis[ ] ; Sputum [ ] ; Snoring [ ]   GI: Vomiting[ ] ; Dysphagia[ ] ; Melena[ ] ; Hematochezia [ ] ; Heartburn[ ] ; Abdominal pain [ ] ; Constipation [ ] ;  Diarrhea [ ] ; BRBPR [ ]   GU: Hematuria[ ] ; Dysuria [ ] ; Nocturia[ ]   Vascular: Pain in legs with walking [ ] ; Pain in feet with lying flat [ ] ; Non-healing sores [ ] ; Stroke [ ] ; TIA [ ] ; Slurred speech [ ] ;  Neuro: Headaches[ ] ; Vertigo[ ] ; Seizures[ ] ; Paresthesias[ ] ;Blurred vision [ ] ; Diplopia [ ] ; Vision changes [ ]   Ortho/Skin: Arthritis [ ] ; Joint pain [ ] ; Muscle pain [ ] ; Joint swelling [ ] ; Back Pain [ ] ; Rash [ ]   Psych: Depression[ ] ; Anxiety[ ]   Heme: Bleeding problems [ ] ; Clotting disorders [ ] ; Anemia [ ]   Endocrine: Diabetes [ ] ; Thyroid dysfunction[ ]   Home Medications Prior to Admission medications   Medication Sig Start Date End Date Taking? Authorizing Provider  ALPRAZolam Duanne Moron) 0.5 MG tablet Take 0.75 mg by mouth at bedtime.    Yes Historical Provider, MD  ambrisentan (LETAIRIS) 5 MG tablet Take 5 mg by mouth daily.   Yes Historical Provider, MD  aspirin 81 MG tablet Take 81 mg by mouth daily.     Yes Historical Provider, MD  atorvastatin (LIPITOR) 80 MG tablet TAKE 1/2 TABLET BY MOUTH DAILY 08/07/15  Yes Satira Sark, MD  AZOPT 1 % ophthalmic suspension Place 1 drop into both eyes 3 (three) times daily.  05/02/12  Yes  Historical Provider, MD  brimonidine (ALPHAGAN) 0.2 % ophthalmic solution Place 1 drop into both eyes 2 (two) times daily.  04/21/15  Yes Historical Provider, MD  Coenzyme Q10 (COQ10) 50 MG CAPS Take 2 tablets by mouth daily.     Yes Historical Provider, MD  fluticasone (FLONASE) 50 MCG/ACT nasal spray Place 2 sprays into both nostrils daily.  05/29/15  Yes Historical Provider, MD  HYDROcodone-acetaminophen (NORCO/VICODIN) 5-325 MG tablet Take 1 tablet by mouth 3 (three) times daily. Take three every day per patient for pain 03/26/15  Yes Historical Provider, MD  latanoprost (XALATAN) 0.005 % ophthalmic solution Place 1 drop into the right eye at bedtime.  03/30/12  Yes Historical Provider, MD  lidocaine (XYLOCAINE) 5 % ointment Apply 1 application  topically daily as needed (boby pain).  04/10/15  Yes Historical Provider, MD  losartan (COZAAR) 100 MG tablet TAKE 1 TABLET BY MOUTH EVERY DAY 08/07/15  Yes Satira Sark, MD  nitroGLYCERIN (NITROSTAT) 0.4 MG SL tablet Place 0.4 mg under the tongue every 5 (five) minutes as needed for chest pain (x 3 tablets daily).   Yes Historical Provider, MD  potassium chloride SA (K-DUR,KLOR-CON) 20 MEQ tablet Take 20 mEq by mouth daily.    Yes Historical Provider, MD  Probiotic Product (PROBIOTIC PO) Take 1 tablet by mouth 2 (two) times daily.   Yes Historical Provider, MD  Tadalafil, PAH, (ADCIRCA) 20 MG TABS Take 2 tablets (40 mg total) by mouth daily. 10/27/15  Yes Collene Gobble, MD  warfarin (COUMADIN) 5 MG tablet Take 5 mg by mouth at bedtime.   Yes Historical Provider, MD  furosemide (LASIX) 80 MG tablet Take 1 tablet (80 mg total) by mouth 2 (two) times daily. 11/19/15   Nita Sells, MD    Past Medical History: Past Medical History  Diagnosis Date  . Essential hypertension   . Hyperlipidemia   . Chronic atrial fibrillation ([place])     Since 2011  . CAD (coronary artery disease)     BMS to LAD 07/2005  . Morbid obesity ([place])   . Psoriasis   . Right ventricular dysfunction   . Tricuspid regurgitation     Severe  . Pulmonary hypertension (HCC)     Mixed, PASP 67 mmHg at [facility] 2013  . GERD (gastroesophageal reflux disease)   . A-fib ([place])   . Myocardial infarction ([place]) 07/2005  . Degenerative arthritis of right knee   . Arthritis     "knees" (11/17/2015)  . On home oxygen therapy     "2.5L at night" (11/17/2015)    Past Surgical History: Past Surgical History  Procedure Laterality Date  . Achilles tendon surgery Right 1970s  . Colonoscopy    . Carpal tunnel release  11/29/2011    Procedure: CARPAL TUNNEL RELEASE;  Surgeon: Cammie Sickle., MD;  Location: [place];  Service: Orthopedics;  Laterality: Right;  . Laparoscopic cholecystectomy    . Retinal  detachment surgery Right   . Eye surgery    . Coronary angioplasty with stent placement  07/2005    Dr. Olevia Perches    Family History: Family History  Problem Relation Age of Onset  . Heart attack Mother   . Coronary artery disease Father   . Stroke Father   . Heart attack Brother   . Heart failure Brother   . COPD Brother   . Emphysema Sister   . Emphysema Brother     Social History: Social History   Social  History  . Marital Status: Married    Spouse Name: N/A  . Number of Children: 2  . Years of Education: N/A   Occupational History  . retired    Social History Main Topics  . Smoking status: Former Smoker -- 0.30 packs/day for .5 years    Types: Cigarettes    Quit date: 08/19/1986  . Smokeless tobacco: Never Used     Comment: pt only smoked for only a few months.  . Alcohol Use: No  . Drug Use: No  . Sexual Activity: No   Other Topics Concern  . None   Social History Narrative    Allergies:  No Known Allergies  Objective:    Vital Signs:   Temp:  [97.5 F (36.4 C)-98.9 F (37.2 C)] 98.6 F (37 C) (04/03 1000) Pulse Rate:  [53-63] 63 (04/03 1000) Resp:  [18-21] 20 (04/03 1000) BP: (83-105)/(46-72) 83/47 mmHg (04/03 1000) SpO2:  [91 %-100 %] 91 % (04/03 1000) Weight:  [233 lb 1.6 oz (105.733 kg)] 233 lb 1.6 oz (105.733 kg) (04/03 0610) Last BM Date: 11/19/15  Weight change: Filed Weights   11/18/15 0544 11/19/15 0629 11/20/15 0610  Weight: 235 lb 11.2 oz (106.913 kg) 227 lb 11.2 oz (103.284 kg) 233 lb 1.6 oz (105.733 kg)    Intake/Output:   Intake/Output Summary (Last 24 hours) at 11/20/15 1137 Last data filed at 11/20/15 0900  Gross per 24 hour  Intake   1060 ml  Output   2725 ml  Net  -1665 ml     Physical Exam: General:  Well appearing. No resp difficulty. In bed  HEENT: normal Neck: supple. JVP 9-10 . Carotids 2+ bilat; no bruits. No lymphadenopathy or thryomegaly appreciated. Cor: PMI nondisplaced. Irregular rate & rhythm. No rubs,  gallops or murmurs. Lungs: RLL and LLL crackles.  Abdomen: obese, soft, nontender, nondistended. No hepatosplenomegaly. No bruits or masses. Good bowel sounds. Extremities: no cyanosis, clubbing, rash, R and LLE trace edema.  Neuro: alert & orientedx3, cranial nerves grossly intact. moves all 4 extremities w/o difficulty. Affect pleasant  Telemetry:  A Fib 50-60s   Labs: Basic Metabolic Panel:  Recent Labs Lab 11/17/15 1010 11/17/15 1501 11/18/15 0229 11/19/15 0401 11/20/15 0300  NA 138 139 141 143 143  K 3.3* 3.5 3.5 4.0 4.4  CL 99* 100* 100* 103 105  CO2 29 28 32 31 32  GLUCOSE 88 103* 112* 92 92  BUN 33* 29* 26* 27* 28*  CREATININE 1.05* 1.03* 1.10* 1.16* 1.18*  CALCIUM 9.1 9.7 9.4 9.6 9.7    Liver Function Tests:  Recent Labs Lab 11/19/15 0401  AST 26  ALT 16  ALKPHOS 51  BILITOT 0.7  PROT 6.2*  ALBUMIN 3.5   No results for input(s): LIPASE, AMYLASE in the last 168 hours. No results for input(s): AMMONIA in the last 168 hours.  CBC:  Recent Labs Lab 11/17/15 1501 11/18/15 0229 11/19/15 0401 11/20/15 0300  WBC 5.0 4.3 4.0 4.2  HGB 11.3* 10.5* 10.5* 10.8*  HCT 34.5* 32.7* 33.9* 33.4*  MCV 92.7 91.9 93.6 94.9  PLT 134* 139* 136* 130*    Cardiac Enzymes:  Recent Labs Lab 11/17/15 1957 11/18/15 0229 11/18/15 0728  TROPONINI 0.03 <0.03 <0.03    BNP: BNP (last 3 results)  Recent Labs  11/17/15 1501  BNP 270.0*    ProBNP (last 3 results) No results for input(s): PROBNP in the last 8760 hours.   CBG: No results for input(s): GLUCAP in the last  168 hours.  Coagulation Studies:  Recent Labs  11/17/15 1528 11/18/15 0229 11/19/15 0401 11/20/15 0300  LABPROT 20.9* 22.3* 22.1* 22.9*  INR 1.80* 1.97* 1.95* 2.03*    Other results: EKG: Imaging:  No results found.   Medications:     Current Medications: . acidophilus   Oral BID  . ALPRAZolam  0.75 mg Oral QHS  . ambrisentan  5 mg Oral Daily  . aspirin EC  81 mg Oral Daily   . atorvastatin  40 mg Oral Daily  . brimonidine  1 drop Both Eyes BID  . brinzolamide  1 drop Both Eyes TID  . fluticasone  2 spray Each Nare Daily  . furosemide  80 mg Intravenous Once  . furosemide  80 mg Oral BID  . latanoprost  1 drop Right Eye QHS  . losartan  100 mg Oral Daily  . pantoprazole  40 mg Oral Q1200  . potassium chloride  40 mEq Oral BID  . sodium chloride flush  3 mL Intravenous Q12H  . Tadalafil (PAH)  40 mg Oral Daily  . timolol  1 drop Both Eyes BID  . Warfarin - Pharmacist Dosing Inpatient   Does not apply q1800     Infusions:      Assessment/Plan/Discussion    1. Dyspnea- Multifactoral with volume overload and PAH. She started letaris and adcirca about 2 weeks ago.    2. PAH- Echo EF 55-60% D shaped septum. Some mention of PE but no recent VQ. Will obtain now. Sleep study was negative in the past but does require oxygen at night. She has not had recent [place] (2013) and will need one soon. Can set up as an outpatient but would like diurese first.  NYHA III. Volume overload noted. Stop po lasix and switch to 80 mg IV lasix twice daily.  Continue letaris 5 mg daily + adicrca for now.  3.  Bradycardia - Noted on 4/1. Not on BB. Watch for now.  4.Chronic A fib -  Rate has been controlled off bb. On coumadin. INR therapeutic.  5. Pulmonary Nodules - Noted on CT of chest 09/2015  6. Hypotension- Hold losartan for now. Watch closely.  7. CAD - BMS LAD 2006 . No CP. Continue statin. On warfarin.   Length of Stay: 3  Amy Clegg NP-C  11/20/2015, 11:37 AM  Advanced Heart Failure Team Pager [phone] (M-F; [place])  Please contact [facility] for night-coverage after hours (4p -7a ) and weekends on [email or web address]  Patient seen with NP, agree with the above note.  1. Acute on chronic diastolic CHF: I think this may have been triggered by starting [place] meds in the setting of pre-existing at least mild volume overload.  Her dyspnea worsened considerably after these meds  were started.  She has felt better with diuresis but is still volume overloaded on exam.  - Lasix 80 mg IV bid today, reassess tomorrow.  2. Atrial fibrillation: Chronic, HR low at times and now off all nodal blockers.  No worrisome bradycardia overnight.  Continue warfarin.  3. PAH: No cath since 2013, recently started on Adcirca and Letairis (which likely triggered the CHF exacerbation).  She had a negative sleep study in the past and has had basically unremarkable chest CTs (other than nodules).  Some mention of ?chronic PE but this was never documented.  There has been some concern that [place] could be rheumatologic-related, she has history of psoriasis.  - Would diurese her more, needs RHC when  more dry (could potentially do as outpatient).  - Continue Adcirca and Letairis at current doses for now.  - Will get V/Q scan to look for chronic PE. - Should probably get rheumatologic serologies also.  4. CAD: Stable, continue statin.  No ASA given warfarin.  5. Hypotension: Hold losartan for now.   Loralie Champagne 11/20/2015 1:37 PM

## 2015-11-20 NOTE — Care Management Important Message (Signed)
Important Message  Patient Details  Name: [name] MRN: [id number] Date of Birth: 02-21-1935   Medicare Important Message Given:  Yes    Junie Engram P [place] 11/20/2015, 1:03 PM

## 2015-11-20 NOTE — Progress Notes (Signed)
ANTICOAGULATION CONSULT NOTE - Follow Up Consult  Pharmacy Consult for Coumadin Indication: atrial fibrillation  No Known Allergies  Patient Measurements: Height: 5\' 3"  (160 cm) Weight: 233 lb 1.6 oz (105.733 kg) IBW/kg (Calculated) : 52.4 Heparin Dosing Weight:   Vital Signs: Temp: 98.6 F (37 C) (04/03 1000) Temp Source: Oral (04/03 1000) BP: 83/47 mmHg (04/03 1000) Pulse Rate: 63 (04/03 1000)  Labs:  Recent Labs  11/17/15 1957 11/18/15 0229 11/18/15 0728 11/19/15 0401 11/20/15 0300  HGB  --  10.5*  --  10.5* 10.8*  HCT  --  32.7*  --  33.9* 33.4*  PLT  --  139*  --  136* 130*  LABPROT  --  22.3*  --  22.1* 22.9*  INR  --  1.97*  --  1.95* 2.03*  CREATININE  --  1.10*  --  1.16* 1.18*  TROPONINI 0.03 <0.03 <0.03  --   --     Estimated Creatinine Clearance: 45.7 mL/min (by C-G formula based on Cr of 1.18).   Medications:  Scheduled:  . acidophilus   Oral BID  . ALPRAZolam  0.75 mg Oral QHS  . ambrisentan  5 mg Oral Daily  . aspirin EC  81 mg Oral Daily  . atorvastatin  40 mg Oral Daily  . brimonidine  1 drop Both Eyes BID  . brinzolamide  1 drop Both Eyes TID  . fluticasone  2 spray Each Nare Daily  . furosemide  80 mg Intravenous BID  . latanoprost  1 drop Right Eye QHS  . pantoprazole  40 mg Oral Q1200  . potassium chloride  40 mEq Oral BID  . sodium chloride flush  3 mL Intravenous Q12H  . Tadalafil (PAH)  40 mg Oral Daily  . timolol  1 drop Both Eyes BID  . Warfarin - Pharmacist Dosing Inpatient   Does not apply q1800    Assessment: 81yo female on Coumadin for AFib, to have V/Q scan to R/O chronic PE.  INR therapeutic this AM, Hg and pltc stable.  No bleeding noted.  Goal of Therapy:  INR 2-3 Monitor platelets by anticoagulation protocol: Yes   Plan:  Resume Coumadin 5mg  daily Continue daily INR Watch for s/s of bleeding  Gracy Bruins, PharmD [facility]

## 2015-11-20 NOTE — Discharge Summary (Signed)
[name] [id number] DOB: [birth year] DOA: 11/17/2015 PCP: Glo Herring., MD  Brief narrative:  81 y/o ? Class III [name] cath 2013 showed this]with Recent start Adcirca 40 mg daily and Letaris 5mg  daily b Dr. Lamonte Sakai Chr Afib , [place] >5 CAD s/p  stent LAD 2006 Bradycardia-suppsoed to d/w Cardiology PPM [per OV 05/2015] HLD Htn Chr D-HF, Echo PASP 70  Remote Tob use Psoriasis pulm nodules OSA AHI 4.1 when last tested  Labs bening IMPRESSION: Cardiomegaly. Central vascular congestion without pulmonary edema. Stable nodule in left upper lobe  Past medical history-As per Problem list Chart reviewed as below-   Consultants:  Cardiology  Procedures:  cxr 3/31  Antibiotics:  nad   Subjective   Still swollen abdomen, lower extremities Still a little short of breath No chest pain No nausea Tolerating diet No burning urine No cough  No sputum    Objective    Interim History:     Objective: Filed Vitals:   11/19/15 1218 11/19/15 2209 11/20/15 0610 11/20/15 1000  BP: 100/46 105/72 99/47 83/47   Pulse: 61 53 53 63  Temp: 97.9 F (36.6 C) 98.9 F (37.2 C) 97.5 F (36.4 C) 98.6 F (37 C)  TempSrc: Oral Oral Oral Oral  Resp: 18 20 21 20   Height:      Weight:   105.733 kg (233 lb 1.6 oz)   SpO2: 94% 96% 100% 91%    Intake/Output Summary (Last 24 hours) at 11/20/15 1824 Last data filed at 11/20/15 1803  Gross per 24 hour  Intake    940 ml  Output   4275 ml  Net  -3335 ml    Exam:  EOMI NCAT S1-S2 no murmur rub or gallop No rales no rhonchi JVD slight Abdomen soft nontender nondistended--Some pitting edema still Lower extremities are wrapped and overtly seem swollen with bowel grade 1-2 lower extremity pitting edema   Data Reviewed: Basic Metabolic Panel:  Recent Labs Lab 11/17/15 1010 11/17/15 1501 11/18/15 0229 11/19/15 0401 11/20/15 0300  NA 138 139 141 143 143  K 3.3* 3.5 3.5 4.0 4.4  CL 99* 100* 100*  103 105  CO2 29 28 32 31 32  GLUCOSE 88 103* 112* 92 92  BUN 33* 29* 26* 27* 28*  CREATININE 1.05* 1.03* 1.10* 1.16* 1.18*  CALCIUM 9.1 9.7 9.4 9.6 9.7   Liver Function Tests:  Recent Labs Lab 11/19/15 0401  AST 26  ALT 16  ALKPHOS 51  BILITOT 0.7  PROT 6.2*  ALBUMIN 3.5   No results for input(s): LIPASE, AMYLASE in the last 168 hours. No results for input(s): AMMONIA in the last 168 hours. CBC:  Recent Labs Lab 11/17/15 1501 11/18/15 0229 11/19/15 0401 11/20/15 0300  WBC 5.0 4.3 4.0 4.2  HGB 11.3* 10.5* 10.5* 10.8*  HCT 34.5* 32.7* 33.9* 33.4*  MCV 92.7 91.9 93.6 94.9  PLT 134* 139* 136* 130*   Cardiac Enzymes:  Recent Labs Lab 11/17/15 1957 11/18/15 0229 11/18/15 0728  TROPONINI 0.03 <0.03 <0.03   BNP: Invalid input(s): POCBNP CBG: No results for input(s): GLUCAP in the last 168 hours.  No results found for this or any previous visit (from the past 240 hour(s)).   Studies:              All Imaging reviewed and is as per above notation   Scheduled Meds: . acidophilus   Oral BID  . ALPRAZolam  0.75 mg Oral QHS  . ambrisentan  5 mg  Oral Daily  . aspirin EC  81 mg Oral Daily  . atorvastatin  40 mg Oral Daily  . brimonidine  1 drop Both Eyes BID  . brinzolamide  1 drop Both Eyes TID  . fluticasone  2 spray Each Nare Daily  . furosemide  80 mg Intravenous BID  . latanoprost  1 drop Right Eye QHS  . pantoprazole  40 mg Oral Q1200  . potassium chloride  40 mEq Oral BID  . sodium chloride flush  3 mL Intravenous Q12H  . Tadalafil (PAH)  40 mg Oral Daily  . timolol  1 drop Both Eyes BID  . warfarin  5 mg Oral q1800  . Warfarin - Pharmacist Dosing Inpatient   Does not apply q1800   Continuous Infusions:    Assessment/Plan:  1. Decompensated heart failure/vasodilation, EF this admission 55-65%  secondary to pulmonary medications-continue Consulted advanced heart failure team-med management as per them-Currently 80 Lasix BD  2.  arrhythmia-check  magnesium given diuresis.  asymtpomatic currently-keep tele 3. Moderate to severe pulmonary hypertension class III-will need further management as an outpatient with pulmonary-discussed in person with Dr. Lamonte Sakai as patient had appointment dated 11/21/15 at his office.  VQ scan in pending. 4. hypertension losartan held by cardiology 5. Hyperlipidemia note autoimmune workup initiated by cardiology  continue atorvastatin 40 daily 6. Bipolar continue alprazolam 0.75 daily at bedtime  7. Reflux continue pantoprazole 40 daily-limited this as an outpatient if possible 8. CAD status post stent LAD 2006-aspirin 81 mg and other medications as above 9. Chronic atrial fibrillation [place] score above 5-continue Coumadin-pharmacy.  INR 1.95 10. Pulmonary nodules-outpatient screening as per low-dose CT protocol per Dr. Lamonte Sakai as OP 11. History psoriasis-monitor-unclear if playing a role in her pulmonary issues- 12. Obstructive sleep apnea-continue BiPAP when necessary-stable 13.  history of bradycardia-supposed to have PPM as per outpatient visit note 05/2015 cardiologist   full CODE STATUS Telemetry disposition as per cardiologist  Verneita Griffes, MD  [facility] Pager [phone] 11/20/2015, 6:24 PM    LOS: 3 days

## 2015-11-20 NOTE — Progress Notes (Signed)
I spoke briefly with [name] and have informed her of appt made with [facility] on 11/30/15 at 1040.  I have given her a map and directions to the clinic.

## 2015-11-20 NOTE — Consult Note (Signed)
   [facility] CM Inpatient Consult   11/20/2015  [name] [birth year] [id number] Patient screened for potential [place] [facility] services. Patient is eligible for [place] of her Medicare plan.  Met with the patient at the bedside with her spouse.  Explained [place] Management services as a covered benefit.  Patient endorse Dr. Redmond School as her primary care provider. Patient was admitted for HF exacerbation and states she will be following up with the HF clinic.  She states no identifiable [facility] care management needs at this time but did want the brochure with contact information and it was given.  If patient's post hospital needs change please place a [facility] Care Management consult. For questions please contact:   Natividad Brood, RN BSN [facility] Liaison  [phone] business mobile phone Toll free office [phone]

## 2015-11-20 NOTE — Progress Notes (Signed)
Heart Failure Navigator Consult Note  Presentation: [name]  is a 81 y.o. female with a Past Medical History of known pulmonary hypertension and recently started on tadalafil and Letaris, atrial fibrillation on Coumadin, hypertension, history of CAD status post bare metal stent to LAD in 2006 who presents today with the above noted complaint. Per patient, she started taking adcirca (tadafill) and Letaris (ambrisentan) approximately 2 weeks back, since then she has had slowly worsening lower extremity edema. Edema has really got worse over the past few days, she has now having trouble putting on socks and putting on her shoes. Since yesterday her chronic exertional dyspnea has worsened significantly, even walking across the room makes a now short of breath. She has chronic orthopnea, but denies PND. She subsequently presented to the ED for further evaluation. I was asked to admit this patient Patient denies any fever, chest pain, cough. She denies any history of nausea, vomiting or diarrhea.  Past Medical History  Diagnosis Date  . Essential hypertension   . Hyperlipidemia   . Chronic atrial fibrillation ([place])     Since 2011  . CAD (coronary artery disease)     BMS to LAD 07/2005  . Morbid obesity ([place])   . Psoriasis   . Right ventricular dysfunction   . Tricuspid regurgitation     Severe  . Pulmonary hypertension ([facility])     Mixed, PASP 67 mmHg at [facility] 2013  . GERD (gastroesophageal reflux disease)   . A-fib ([place])   . Myocardial infarction ([place]) 07/2005  . Degenerative arthritis of right knee   . Arthritis     "knees" (11/17/2015)  . On home oxygen therapy     "2.5L at night" (11/17/2015)    Social History   Social History  . Marital Status: Married    Spouse Name: N/A  . Number of Children: 2  . Years of Education: N/A   Occupational History  . retired    Social History Main Topics  . Smoking status: Former Smoker -- 0.30 packs/day for .5 years    Types:  Cigarettes    Quit date: 08/19/1986  . Smokeless tobacco: Never Used     Comment: pt only smoked for only a few months.  . Alcohol Use: No  . Drug Use: No  . Sexual Activity: No   Other Topics Concern  . None   Social History Narrative    ECHO:Study Conclusions--11/18/15  - Left ventricle: The cavity size was normal. Wall thickness was  normal. Systolic function was normal. The estimated ejection  fraction was in the range of 55% to 60%. Wall motion was normal;  there were no regional wall motion abnormalities. Doppler  parameters are consistent with high ventricular filling pressure. - Ventricular septum: The contour showed diastolic flattening and  systolic flattening. - Aortic valve: There was trivial regurgitation. - Mitral valve: Calcified annulus. There was mild regurgitation. - Left atrium: The atrium was severely dilated. - Right atrium: The atrium was severely dilated. - Atrial septum: The septum bowed from right to left, consistent  with increased right atrial pressure. - Pulmonary arteries: Systolic pressure was severely increased. PA  peak pressure: 67 mm Hg (S). - Pericardium, extracardiac: A trivial pericardial effusion was  identified.  Impressions:  - Normal LV systolic function; elevated LV filling pressure; D  shaped septum suggestive of pulmonary hypertension; severe  biatrial enlargement; trace AI; mild MR; mild TR with severely  elevated pulmonary pressure.  Transthoracic echocardiography. M-mode, complete 2D, spectral  Doppler, and color Doppler. Birthdate: Patient birthdate: May 07, 1935. Age: Patient is 81 yr old. Sex: Gender: female. BMI: 41.8 kg/m^2. Blood pressure: 108/58 Patient status: Inpatient. Study date: Study date: 11/18/2015. Study time: 02:11 PM. Location: Bedside.  BNP    Component Value Date/Time   BNP 270.0* 11/17/2015 1501    ProBNP    Component Value Date/Time   PROBNP 247.0* 05/30/2011 1140      Education Assessment and Provision:  Detailed education and instructions provided on heart failure disease management including the following:  Signs and symptoms of Heart Failure When to call the physician Importance of daily weights Low sodium diet Fluid restriction Medication management Anticipated future follow-up appointments  Patient education given on each of the above topics.  Patient acknowledges understanding and acceptance of all instructions.  I spoke with patient regarding her HF. She tells me that she weighs everyday and we reviewed when to contact physician related to her weight increases.  She says that she has been careful with salt and sodium "for years".  I reviewed a low sodium diet and high sodium foods to avoid.  She denies any issues getting and taking prescribed medications--except issues that lead to admission.  She will follow with the [facility] Clinic after discharge.  Education Materials:  "Living Better With Heart Failure" Booklet, Daily Weight Tracker Tool    High Risk Criteria for Readmission and/or Poor Patient Outcomes:  (Recommend Follow-up with Advanced Heart Failure Clinic)--yes   EF <30%- No-55-60%  2 or more admissions in 6 months- No  Difficult social situation- No  Demonstrates medication noncompliance- No     Barriers of Care:  Knowledge and compliance   Discharge Planning:  Plans to return to home with husband to [facility].

## 2015-11-20 NOTE — Progress Notes (Signed)
Patient refused to wear CPAP. RT will continue to monitor as needed.

## 2015-11-21 ENCOUNTER — Ambulatory Visit: Payer: Medicare Other | Admitting: Emergency Medicine

## 2015-11-21 LAB — BASIC METABOLIC PANEL
Anion gap: 11 (ref 5–15)
BUN: 35 mg/dL — AB (ref 6–20)
CHLORIDE: 101 mmol/L (ref 101–111)
CO2: 31 mmol/L (ref 22–32)
Calcium: 9.8 mg/dL (ref 8.9–10.3)
Creatinine, Ser: 1.17 mg/dL — ABNORMAL HIGH (ref 0.44–1.00)
GFR calc Af Amer: 50 mL/min — ABNORMAL LOW (ref >60)
GFR, EST NON AFRICAN AMERICAN: 43 mL/min — AB (ref >60)
GLUCOSE: 78 mg/dL (ref 65–99)
POTASSIUM: 4.4 mmol/L (ref 3.5–5.1)
Sodium: 143 mmol/L (ref 135–145)

## 2015-11-21 LAB — CBC
HCT: 34.2 % — ABNORMAL LOW (ref 36.0–46.0)
Hemoglobin: 10.5 g/dL — ABNORMAL LOW (ref 12.0–15.0)
MCH: 29.2 pg (ref 26.0–34.0)
MCHC: 30.7 g/dL (ref 30.0–36.0)
MCV: 95.3 fL (ref 78.0–100.0)
PLATELETS: 130 10*3/uL — AB (ref 150–400)
RBC: 3.59 MIL/uL — AB (ref 3.87–5.11)
RDW: 14.7 % (ref 11.5–15.5)
WBC: 4.2 10*3/uL (ref 4.0–10.5)

## 2015-11-21 LAB — PROTIME-INR
INR: 2.14 — AB (ref 0.00–1.49)
PROTHROMBIN TIME: 23.7 s — AB (ref 11.6–15.2)

## 2015-11-21 NOTE — Progress Notes (Signed)
ANTICOAGULATION CONSULT NOTE - Follow Up Consult  Pharmacy Consult for Coumadin Indication: atrial fibrillation  No Known Allergies  Patient Measurements: Height: 5\' 3"  (160 cm) Weight: 229 lb 3.2 oz (103.964 kg) IBW/kg (Calculated) : 52.4  Vital Signs: Temp: 97.8 F (36.6 C) (04/04 0533) Temp Source: Oral (04/04 0533) BP: 111/48 mmHg (04/04 0533) Pulse Rate: 58 (04/04 0533)  Labs:  Recent Labs  11/19/15 0401 11/20/15 0300 11/21/15 0518  HGB 10.5* 10.8* 10.5*  HCT 33.9* 33.4* 34.2*  PLT 136* 130* 130*  LABPROT 22.1* 22.9* 23.7*  INR 1.95* 2.03* 2.14*  CREATININE 1.16* 1.18* 1.17*    Estimated Creatinine Clearance: 45.7 mL/min (by C-G formula based on Cr of 1.17).  Assessment: 80yof continues on coumadin for afib. INR therapeutic at 2.14. VQ scan with low probability for PE. CBC stable. No bleeding.  PTA dose: 5mg  daily  Goal of Therapy:  INR 2-3 Monitor platelets by anticoagulation protocol: Yes   Plan:  1) Continue coumadin 5mg  daily 2) Daily INR  Deboraha Sprang 11/21/2015,10:04 AM

## 2015-11-21 NOTE — Progress Notes (Signed)
Advanced Heart Failure Rounding Note  Referring Physician: Dr Verlon Au Primary Physician: Dr Gerarda Fraction  Primary Cardiologist: Dr Domenic Polite  Pulmonology: Dr Lamonte Sakai.   Reason for Consultation: Pulmonary HTN   Subjective:    VQ scan 11/20/15: Low probability for pulmonary embolus (less than 20%). Patchy areas of decreased ventilation with matched perfusion defects.  CXR 11/20/15: Improving vascular congestion compared to CXR 3/31.  Feels OK. Was taking 40 mg po lasix TID at home with no relief.  Was getting worried her medicines weren't working. Feeling much better. Concerned for her home diuretics going forward.  Denies any lightheadedness or dizziness with low heart rates.   Out 3.1 L and down 4 lbs overnight with IV lasix 80 mg BID. Creatinine stable.   Objective:   Weight Range: 229 lb 3.2 oz (103.964 kg) Body mass index is 40.61 kg/(m^2).   Vital Signs:   Temp:  [97.5 F (36.4 C)-98.6 F (37 C)] 97.8 F (36.6 C) (04/04 0533) Pulse Rate:  [44-63] 58 (04/04 0533) Resp:  [19-20] 19 (04/04 0533) BP: (83-111)/(41-68) 111/48 mmHg (04/04 0533) SpO2:  [91 %-98 %] 92 % (04/04 0533) Weight:  [229 lb 3.2 oz (103.964 kg)] 229 lb 3.2 oz (103.964 kg) (04/04 0533) Last BM Date: 11/20/15  Weight change: Filed Weights   11/19/15 0629 11/20/15 0610 11/21/15 0533  Weight: 227 lb 11.2 oz (103.284 kg) 233 lb 1.6 oz (105.733 kg) 229 lb 3.2 oz (103.964 kg)    Intake/Output:   Intake/Output Summary (Last 24 hours) at 11/21/15 0807 Last data filed at 11/21/15 0726  Gross per 24 hour  Intake    940 ml  Output   4100 ml  Net  -3160 ml     Physical Exam: General: Well appearing. No resp difficulty. Sitting on edge of bed  HEENT: normal Neck: supple. JVP 9-10. Carotids 2+ bilat; no bruits. No thyromegaly or nodule noted. Cor: PMI nondisplaced. Irregular rate & rhythm. Brady No rubs, gallops or murmurs. Lungs: Bibasilar crackles, normal effort Abdomen: obese, soft, NT, ND, no HSM. No  bruits or masses. +BS  Extremities: no cyanosis, clubbing, rash, R and LLE trace edema.  Neuro: alert & orientedx3, cranial nerves grossly intact. moves all 4 extremities w/o difficulty. Affect pleasant  Telemetry: A Fib 50-60s , Several alarms for rates in 30s.  Labs: CBC  Recent Labs  11/20/15 0300 11/21/15 0518  WBC 4.2 4.2  HGB 10.8* 10.5*  HCT 33.4* 34.2*  MCV 94.9 95.3  PLT 130* [id number]*   Basic Metabolic Panel  Recent Labs  11/20/15 0300 11/21/15 0518  NA 143 143  K 4.4 4.4  CL 105 101  CO2 32 31  GLUCOSE 92 78  BUN 28* 35*  CREATININE 1.18* 1.17*  CALCIUM 9.7 9.8   Liver Function Tests  Recent Labs  11/19/15 0401  AST 26  ALT 16  ALKPHOS 51  BILITOT 0.7  PROT 6.2*  ALBUMIN 3.5   No results for input(s): LIPASE, AMYLASE in the last 72 hours. Cardiac Enzymes No results for input(s): CKTOTAL, CKMB, CKMBINDEX, TROPONINI in the last 72 hours.  BNP: BNP (last 3 results)  Recent Labs  11/17/15 1501  BNP 270.0*    ProBNP (last 3 results) No results for input(s): PROBNP in the last 8760 hours.   D-Dimer No results for input(s): DDIMER in the last 72 hours. Hemoglobin A1C No results for input(s): HGBA1C in the last 72 hours. Fasting Lipid Panel No results for input(s): CHOL, HDL, LDLCALC, TRIG, CHOLHDL,  LDLDIRECT in the last 72 hours. Thyroid Function Tests No results for input(s): TSH, T4TOTAL, T3FREE, THYROIDAB in the last 72 hours.  Invalid input(s): FREET3  Other results:     Imaging/Studies:  Dg Chest 2 View  11/20/2015  CLINICAL DATA:  Patient admitted 3 days ago with shortness of breath. EXAM: CHEST  2 VIEW COMPARISON:  11/17/2015. FINDINGS: Cardiomegaly. Improving vascular congestion. Trace BILATERAL effusions are also improved. No active infiltrates or failure. No acute osseous findings. IMPRESSION: Improving vascular congestion compared with 3/31.  No new findings. Electronically Signed   By: Staci Righter M.D.   On: 11/20/2015  14:29   Nm Pulmonary Perf And Vent  11/20/2015  CLINICAL DATA:  81 year old female with acute shortness of breath for 3 days. EXAM: NUCLEAR MEDICINE VENTILATION - PERFUSION LUNG SCAN TECHNIQUE: Ventilation images were obtained in multiple projections using inhaled aerosol Tc-84m DTPA. Perfusion images were obtained in multiple projections after intravenous injection of Tc-38m MAA. RADIOPHARMACEUTICALS:  31.0 mCi Technetium-7m DTPA aerosol inhalation and 4.0 mCi Technetium-75m MAA IV COMPARISON:  11/20/2015 chest radiograph FINDINGS: Ventilation: Patchy areas of decreased ventilation identified. Perfusion: Patchy areas of decreased perfusion match ventilation. No perfusion/ ventilation mismatches are identified. IMPRESSION: Low probability for pulmonary embolus (less than 20%) Electronically Signed   By: Margarette Canada M.D.   On: 11/20/2015 14:27     Latest Echo  Latest Cath   Medications:     Scheduled Medications: . acidophilus   Oral BID  . ALPRAZolam  0.75 mg Oral QHS  . ambrisentan  5 mg Oral Daily  . aspirin EC  81 mg Oral Daily  . atorvastatin  40 mg Oral Daily  . brimonidine  1 drop Both Eyes BID  . brinzolamide  1 drop Both Eyes TID  . fluticasone  2 spray Each Nare Daily  . furosemide  80 mg Intravenous BID  . latanoprost  1 drop Right Eye QHS  . pantoprazole  40 mg Oral Q1200  . potassium chloride  40 mEq Oral BID  . sodium chloride flush  3 mL Intravenous Q12H  . Tadalafil (PAH)  40 mg Oral Daily  . timolol  1 drop Both Eyes BID  . warfarin  5 mg Oral q1800  . Warfarin - Pharmacist Dosing Inpatient   Does not apply q1800     Infusions:     PRN Medications:  sodium chloride, acetaminophen **OR** acetaminophen, albuterol, HYDROcodone-acetaminophen, lidocaine, nitroGLYCERIN, ondansetron **OR** ondansetron (ZOFRAN) IV, polyethylene glycol, sodium chloride flush, technetium albumin aggregated, technetium TC 25M diethylenetriame-pentaacetic acid   Assessment/Plan    [name] is a 81 year old with history of pulmonary HTN on letaris and tadalafil, chronic A fib on coumadin, HTN, CAD BMS LAD 2006, obesity, psoriasis, bradycardia, and severe TR admitted with increased dyspnea on exertion and lower extremity edema. Sleep test negative but does use oxygen at night.   1. Acute on chronic diastolic CHF: ECho 0000000 EF 55-60%, PA peak pressure 67 mm Hg - ? triggered by starting [place] meds in the setting of pre-existing at least mild volume overload. Her dyspnea worsened considerably after these meds were started. - She feels slightly better but remains volume overloaded on exam.  - Continue Lasix 80 mg IV bid today - K stable 2. Atrial fibrillation: Chronic, HR low at times and now off all nodal blockers. - Remains bradycardic 40-50s this am. HR as high as into 70s, and several instances of bradycardia into the 30s. May need EP to see if continues  to drop that low.  - Continue warfarin.  3. PAH: No cath since 2013, recently started on Adcirca and Letairis (which likely triggered the CHF exacerbation). She had a negative sleep study in the past and has had basically unremarkable chest CTs (other than nodules). Some mention of ?chronic PE but this was never documented. There has been some concern that [place] could be rheumatologic-related, she has history of psoriasis.  - Needs RHC when more dry (could potentially do as outpatient).  - Continue Adcirca and Letairis at current doses for now.  - V/Q scan low probability. No Ventilation/perfusion mismatch appreciated.  - Should probably get rheumatologic serologies also.  4. CAD: Stable, continue statin. No ASA given warfarin.  5. Hypotension: Hold losartan for now.   Length of Stay: 4  Brianna Friar PA-C 11/21/2015, 8:07 AM  Advanced Heart Failure Team Pager [phone] (M-F; 7a - 4p)  Please contact [facility] for night-coverage after hours (4p -7a ) and weekends on [email or web address]  Diuresing  well but still volume overloaded. Will continue IV lasix today. VQ low prob.HR low but ok.   Hopefully can get her home soon with RHC as outpatient.   Brianna Picker,MD 8:43 AM

## 2015-11-21 NOTE — Discharge Summary (Signed)
Henry Russel DI:[id number] DOB: Dec 13, 1934 DOA: 11/17/2015 PCP: Glo Herring., MD  Brief narrative:  81 y/o ? Class III [name] cath 2013 showed this]with Recent start Adcirca 40 mg daily and Letaris 5mg  daily b Dr. Lamonte Sakai Chr Afib , [place] >5 CAD s/p  stent LAD 2006 Bradycardia-suppsoed to d/w Cardiology PPM [per OV 05/2015] HLD Htn Chr D-HF, Echo PASP 70  Remote Tob use Psoriasis pulm nodules OSA AHI 4.1 when last tested  Labs bening IMPRESSION: Cardiomegaly. Central vascular congestion without pulmonary edema. Stable nodule in left upper lobe  Past medical history-As per Problem list Chart reviewed as below-   Consultants:  Cardiology  Procedures:  cxr 3/31  Antibiotics:  nad   Subjective   Looks as well as feels great Swelling in abdomen, lower extremities much improved Has ambulated a couple times without shortness of breath No other problems   Objective    Interim History:     Objective: Filed Vitals:   11/20/15 2100 11/20/15 2156 11/21/15 0533 11/21/15 1123  BP: 106/41 106/68 111/48 104/55  Pulse: 44 51 58 62  Temp: 97.5 F (36.4 C)  97.8 F (36.6 C) 98.1 F (36.7 C)  TempSrc: Oral  Oral Oral  Resp: 20  19 18   Height:      Weight:   103.964 kg (229 lb 3.2 oz)   SpO2: 98%  92% 90%    Intake/Output Summary (Last 24 hours) at 11/21/15 1812 Last data filed at 11/21/15 1633  Gross per 24 hour  Intake    720 ml  Output   2550 ml  Net  -1830 ml    Exam:  EOMI NCAT S1-S2 no murmur rub or gallop No rales no rhonchi JVD slight, I cannot appreciate any today Abdomen soft nontender nondistended Moderate, +1 lower extremity edema  Data Reviewed: Basic Metabolic Panel:  Recent Labs Lab 11/17/15 1501 11/18/15 0229 11/19/15 0401 11/20/15 0300 11/21/15 0518  NA 139 141 143 143 143  K 3.5 3.5 4.0 4.4 4.4  CL 100* 100* 103 105 101  CO2 28 32 31 32 31  GLUCOSE 103* 112* 92 92 78  BUN 29* 26* 27* 28* 35*    CREATININE 1.03* 1.10* 1.16* 1.18* 1.17*  CALCIUM 9.7 9.4 9.6 9.7 9.8   Liver Function Tests:  Recent Labs Lab 11/19/15 0401  AST 26  ALT 16  ALKPHOS 51  BILITOT 0.7  PROT 6.2*  ALBUMIN 3.5   No results for input(s): LIPASE, AMYLASE in the last 168 hours. No results for input(s): AMMONIA in the last 168 hours. CBC:  Recent Labs Lab 11/17/15 1501 11/18/15 0229 11/19/15 0401 11/20/15 0300 11/21/15 0518  WBC 5.0 4.3 4.0 4.2 4.2  HGB 11.3* 10.5* 10.5* 10.8* 10.5*  HCT 34.5* 32.7* 33.9* 33.4* 34.2*  MCV 92.7 91.9 93.6 94.9 95.3  PLT 134* 139* 136* 130* 130*   Cardiac Enzymes:  Recent Labs Lab 11/17/15 1957 11/18/15 0229 11/18/15 0728  TROPONINI 0.03 <0.03 <0.03   BNP: Invalid input(s): POCBNP CBG: No results for input(s): GLUCAP in the last 168 hours.  No results found for this or any previous visit (from the past 240 hour(s)).   Studies:              All Imaging reviewed and is as per above notation   Scheduled Meds: . acidophilus   Oral BID  . ALPRAZolam  0.75 mg Oral QHS  . ambrisentan  5 mg Oral Daily  . aspirin EC  81 mg Oral Daily  . atorvastatin  40 mg Oral Daily  . brimonidine  1 drop Both Eyes BID  . brinzolamide  1 drop Both Eyes TID  . fluticasone  2 spray Each Nare Daily  . furosemide  80 mg Intravenous BID  . latanoprost  1 drop Right Eye QHS  . pantoprazole  40 mg Oral Q1200  . potassium chloride  40 mEq Oral BID  . sodium chloride flush  3 mL Intravenous Q12H  . Tadalafil (PAH)  40 mg Oral Daily  . timolol  1 drop Both Eyes BID  . warfarin  5 mg Oral q1800  . Warfarin - Pharmacist Dosing Inpatient   Does not apply q1800   Continuous Infusions:    Assessment/Plan:  1. Decompensated heart failure/vasodilation, EF this admission 55-65%  secondary to pulmonary medications-continue Consulted advanced heart failure team-med management as per them-Currently 80 Lasix BD --8 L total so far this admission and -6 pounds from admission. 2.   arrhythmia- asymtpomatic currently-keep tele 3. Moderate to severe pulmonary hypertension class III-will need further management as an outpatient with pulmonary-discussed in person with Dr. Lamonte Sakai as patient had appointment dated 11/21/15 at his office.  VQ scan negative for pulmonary embolism.autoimmune workup suggested by cardiology-defer to pulmonology as an outpatient to be performed-unlikely at age 55 would benefit from immunomodulators? 4. hypertension losartan held by cardiology 5. Hyperlipidemia -note   continue atorvastatin 40 daily 6. Bipolar continue alprazolam 0.75 daily at bedtime  7. Reflux continue pantoprazole 40 daily-limited this as an outpatient if possible 8. CAD status post stent LAD 2006-aspirin 81 mg and other medications as above 9. Chronic atrial fibrillation [place] score above 5-continue Coumadin-pharmacy.  INR 2.14 10. Pulmonary nodules-outpatient screening as per low-dose CT protocol per Dr. Lamonte Sakai as OP 11. History psoriasis-monitor-unclear if playing a role in her pulmonary issues-defer to Dr. Lamonte Sakai to decide- 12. Obstructive sleep apnea-continue BiPAP when necessary-stable 13.  history of bradycardia-supposed to have PPM as per outpatient visit note 05/2015 cardiologist   full CODE STATUS Telemetry disposition-probably can discharge a.m.-note that medications need to be finalized by cardiology prior to discharge-anticipated discharge 11/22/2015 without home health as is independent  Verneita Griffes, MD  [facility] Pager [phone] 11/21/2015, 6:12 PM    LOS: 4 days

## 2015-11-21 NOTE — Discharge Instructions (Signed)

## 2015-11-22 DIAGNOSIS — R918 Other nonspecific abnormal finding of lung field: Secondary | ICD-10-CM

## 2015-11-22 LAB — CBC
HEMATOCRIT: 34.5 % — AB (ref 36.0–46.0)
Hemoglobin: 10.7 g/dL — ABNORMAL LOW (ref 12.0–15.0)
MCH: 29.2 pg (ref 26.0–34.0)
MCHC: 31 g/dL (ref 30.0–36.0)
MCV: 94.3 fL (ref 78.0–100.0)
Platelets: 136 10*3/uL — ABNORMAL LOW (ref 150–400)
RBC: 3.66 MIL/uL — ABNORMAL LOW (ref 3.87–5.11)
RDW: 14.4 % (ref 11.5–15.5)
WBC: 4.7 10*3/uL (ref 4.0–10.5)

## 2015-11-22 LAB — COMPREHENSIVE METABOLIC PANEL
ALBUMIN: 3.6 g/dL (ref 3.5–5.0)
ALT: 13 U/L — ABNORMAL LOW (ref 14–54)
ANION GAP: 12 (ref 5–15)
AST: 21 U/L (ref 15–41)
Alkaline Phosphatase: 50 U/L (ref 38–126)
BILIRUBIN TOTAL: 1 mg/dL (ref 0.3–1.2)
BUN: 38 mg/dL — ABNORMAL HIGH (ref 6–20)
CHLORIDE: 99 mmol/L — AB (ref 101–111)
CO2: 31 mmol/L (ref 22–32)
Calcium: 9.9 mg/dL (ref 8.9–10.3)
Creatinine, Ser: 1.12 mg/dL — ABNORMAL HIGH (ref 0.44–1.00)
GFR calc Af Amer: 53 mL/min — ABNORMAL LOW (ref >60)
GFR, EST NON AFRICAN AMERICAN: 46 mL/min — AB (ref >60)
GLUCOSE: 79 mg/dL (ref 65–99)
Potassium: 4.3 mmol/L (ref 3.5–5.1)
SODIUM: 142 mmol/L (ref 135–145)
TOTAL PROTEIN: 6.2 g/dL — AB (ref 6.5–8.1)

## 2015-11-22 LAB — PROTIME-INR
INR: 2.05 — AB (ref 0.00–1.49)
Prothrombin Time: 22.9 seconds — ABNORMAL HIGH (ref 11.6–15.2)

## 2015-11-22 LAB — MAGNESIUM: Magnesium: 2.1 mg/dL (ref 1.7–2.4)

## 2015-11-22 MED ORDER — POTASSIUM CHLORIDE CRYS ER 20 MEQ PO TBCR: 20.0000 meq | EXTENDED_RELEASE_TABLET | Freq: Two times a day (BID) | ORAL | Status: DC

## 2015-11-22 MED ORDER — TORSEMIDE 20 MG PO TABS: 40.0000 mg | ORAL_TABLET | Freq: Every day | ORAL | Status: DC

## 2015-11-22 NOTE — Discharge Summary (Signed)
PATIENT DETAILS Name: [name] Age: 81 y.o. Sex: female Date of Birth: 1934-09-02 MRN: [id number]. Admitting Physician: Jonetta Osgood, MD [id number] J., MD  Admit Date: 11/17/2015 Discharge date: 11/22/2015  Recommendations for Outpatient Follow-up:  1. New Medication-Demadex 2. Please ensure follow-up with cardiology. 3. Please repeat BMET at next visit   PRIMARY DISCHARGE DIAGNOSIS:  Principal Problem:   CHF (congestive heart failure) ([place]) Active Problems:   Hyperlipidemia   Essential hypertension   GERD   Atrial fibrillation (HCC)   CAD (coronary artery disease)   Pulmonary nodules   Acute congestive heart failure ([place])      PAST MEDICAL HISTORY: Past Medical History  Diagnosis Date  . Essential hypertension   . Hyperlipidemia   . Chronic atrial fibrillation ([place])     Since 2011  . CAD (coronary artery disease)     BMS to LAD 07/2005  . Morbid obesity ([place])   . Psoriasis   . Right ventricular dysfunction   . Tricuspid regurgitation     Severe  . Pulmonary hypertension (HCC)     Mixed, PASP 67 mmHg at [facility] 2013  . GERD (gastroesophageal reflux disease)   . A-fib ([place])   . Myocardial infarction ([place]) 07/2005  . Degenerative arthritis of right knee   . Arthritis     "knees" (11/17/2015)  . On home oxygen therapy     "2.5L at night" (11/17/2015)    DISCHARGE MEDICATIONS: Current Discharge Medication List    START taking these medications   Details  torsemide (DEMADEX) 20 MG tablet Take 2 tablets (40 mg total) by mouth daily. Qty: 180 tablet, Refills: 0      CONTINUE these medications which have CHANGED   Details  potassium chloride SA (K-DUR,KLOR-CON) 20 MEQ tablet Take 1 tablet (20 mEq total) by mouth 2 (two) times daily. Qty: 60 tablet, Refills: 0      CONTINUE these medications which have NOT CHANGED   Details  ALPRAZolam (XANAX) 0.5 MG tablet Take 0.75 mg by mouth at bedtime.     ambrisentan (LETAIRIS) 5 MG tablet Take  5 mg by mouth daily.    aspirin 81 MG tablet Take 81 mg by mouth daily.      atorvastatin (LIPITOR) 80 MG tablet TAKE 1/2 TABLET BY MOUTH DAILY Qty: 45 tablet, Refills: 2    AZOPT 1 % ophthalmic suspension Place 1 drop into both eyes 3 (three) times daily.     brimonidine (ALPHAGAN) 0.2 % ophthalmic solution Place 1 drop into both eyes 2 (two) times daily.     Coenzyme Q10 (COQ10) 50 MG CAPS Take 2 tablets by mouth daily.      fluticasone (FLONASE) 50 MCG/ACT nasal spray Place 2 sprays into both nostrils daily.     HYDROcodone-acetaminophen (NORCO/VICODIN) 5-325 MG tablet Take 1 tablet by mouth 3 (three) times daily. Take three every day per patient for pain    latanoprost (XALATAN) 0.005 % ophthalmic solution Place 1 drop into the right eye at bedtime.     lidocaine (XYLOCAINE) 5 % ointment Apply 1 application topically daily as needed (boby pain).     nitroGLYCERIN (NITROSTAT) 0.4 MG SL tablet Place 0.4 mg under the tongue every 5 (five) minutes as needed for chest pain (x 3 tablets daily).    Probiotic Product (PROBIOTIC PO) Take 1 tablet by mouth 2 (two) times daily.    Tadalafil, PAH, (ADCIRCA) 20 MG TABS Take 2 tablets (40 mg total) by mouth daily. Qty: 60 tablet,  Refills: 3    warfarin (COUMADIN) 5 MG tablet Take 5 mg by mouth at bedtime.      STOP taking these medications     losartan (COZAAR) 100 MG tablet         ALLERGIES:  No Known Allergies  BRIEF HPI:  See H&P, Labs, Consult and Test reports for all details in brief, patient was admitted for worsening lower ext edema.   CONSULTATIONS:   cardiology  PERTINENT RADIOLOGIC STUDIES: Dg Chest 2 View  11/20/2015  CLINICAL DATA:  Patient admitted 3 days ago with shortness of breath. EXAM: CHEST  2 VIEW COMPARISON:  11/17/2015. FINDINGS: Cardiomegaly. Improving vascular congestion. Trace BILATERAL effusions are also improved. No active infiltrates or failure. No acute osseous findings. IMPRESSION: Improving  vascular congestion compared with 3/31.  No new findings. Electronically Signed   By: Staci Righter M.D.   On: 11/20/2015 14:29   Dg Chest 2 View  11/17/2015  CLINICAL DATA:  Shortness of breath, cough EXAM: CHEST  2 VIEW COMPARISON:  10/03/2015 and 05/29/2015 FINDINGS: Cardiomegaly again noted. Central mild vascular congestion without convincing pulmonary edema. Left upper lobe nodule again noted measures 9 mm without change from prior exam. No segmental infiltrate. Osteopenia and mild degenerative change thoracic spine IMPRESSION: Cardiomegaly. Central vascular congestion without pulmonary edema. Stable nodule in left upper lobe. Electronically Signed   By: Lahoma Crocker M.D.   On: 11/17/2015 15:28   Nm Pulmonary Perf And Vent  11/20/2015  CLINICAL DATA:  81 year old female with acute shortness of breath for 3 days. EXAM: NUCLEAR MEDICINE VENTILATION - PERFUSION LUNG SCAN TECHNIQUE: Ventilation images were obtained in multiple projections using inhaled aerosol Tc-73m DTPA. Perfusion images were obtained in multiple projections after intravenous injection of Tc-72m MAA. RADIOPHARMACEUTICALS:  31.0 mCi Technetium-68m DTPA aerosol inhalation and 4.0 mCi Technetium-69m MAA IV COMPARISON:  11/20/2015 chest radiograph FINDINGS: Ventilation: Patchy areas of decreased ventilation identified. Perfusion: Patchy areas of decreased perfusion match ventilation. No perfusion/ ventilation mismatches are identified. IMPRESSION: Low probability for pulmonary embolus (less than 20%) Electronically Signed   By: Margarette Canada M.D.   On: 11/20/2015 14:27     PERTINENT LAB RESULTS: CBC:  Recent Labs  11/21/15 0518 11/22/15 0231  WBC 4.2 4.7  HGB 10.5* 10.7*  HCT 34.2* 34.5*  PLT 130* 136*   CMET CMP     Component Value Date/Time   NA 142 11/22/2015 0231   K 4.3 11/22/2015 0231   CL 99* 11/22/2015 0231   CO2 31 11/22/2015 0231   GLUCOSE 79 11/22/2015 0231   BUN 38* 11/22/2015 0231   CREATININE 1.12* 11/22/2015  0231   CALCIUM 9.9 11/22/2015 0231   PROT 6.2* 11/22/2015 0231   ALBUMIN 3.6 11/22/2015 0231   AST 21 11/22/2015 0231   ALT 13* 11/22/2015 0231   ALKPHOS 50 11/22/2015 0231   BILITOT 1.0 11/22/2015 0231   GFRNONAA 46* 11/22/2015 0231   GFRAA 53* 11/22/2015 0231    GFR Estimated Creatinine Clearance: 47.4 mL/min (by C-G formula based on Cr of 1.12). No results for input(s): LIPASE, AMYLASE in the last 72 hours. No results for input(s): CKTOTAL, CKMB, CKMBINDEX, TROPONINI in the last 72 hours. Invalid input(s): POCBNP No results for input(s): DDIMER in the last 72 hours. No results for input(s): HGBA1C in the last 72 hours. No results for input(s): CHOL, HDL, LDLCALC, TRIG, CHOLHDL, LDLDIRECT in the last 72 hours. No results for input(s): TSH, T4TOTAL, T3FREE, THYROIDAB in the last 72 hours.  Invalid  input(s): FREET3 No results for input(s): VITAMINB12, FOLATE, FERRITIN, TIBC, IRON, RETICCTPCT in the last 72 hours. Coags:  Recent Labs  11/21/15 0518 11/22/15 0231  INR 2.14* 2.05*   Microbiology: No results found for this or any previous visit (from the past 240 hour(s)).   BRIEF HOSPITAL COURSE:  . Acute diastolic heart failure: Admitted and started on intravenous Lasix, CHF team consulted. ECho 11/18/15 EF 55-60%, PA peak pressure 67 mm Hg.Significantly more compensated, recommendations are to transition to [facility] on discharge. Outpatient appointment with cardiology/CHF clinic made.   [name] History of pulmonary hypertension: Followed by Dr. Ellouise Newer on tadalafil and ambrisentan on mid-March. Cardiology plans a repeat RHC when more stable in the outpatient setting. VQ scan was done which was of low probability. Defer further workup/monitor to the outpatient setting.  . Essential hypertension: Hold losartan on discharge,.   . Pulmonary nodules: Continue outpatient follow-up with pulmonology   . Atrial fibrillation: Rate controlled, continue Coumadin per pharmacy.   [name]  GERD: Continue PPI.   [name] Hyperlipidemia: Continue statin.   [name] CAD (coronary artery disease): Continue aspirin and statin.  TODAY-DAY OF DISCHARGE:  Subjective:   [place] today has no headache,no chest abdominal pain,no new weakness tingling or numbness, feels much better wants to go home today.   Objective:   Blood pressure 107/51, pulse 66, temperature 97.9 F (36.6 C), temperature source Oral, resp. rate 18, height 5\' 3"  (1.6 m), weight 102.558 kg (226 lb 1.6 oz), SpO2 92 %.  Intake/Output Summary (Last 24 hours) at 11/22/15 0937 Last data filed at 11/22/15 0854  Gross per 24 hour  Intake    720 ml  Output   2550 ml  Net  -1830 ml   Filed Weights   11/20/15 0610 11/21/15 0533 11/22/15 0557  Weight: 105.733 kg (233 lb 1.6 oz) 103.964 kg (229 lb 3.2 oz) 102.558 kg (226 lb 1.6 oz)    Exam Awake Alert, Oriented *3, No new F.N deficits, Normal affect [place].AT,PERRAL Supple Neck,No JVD, No cervical lymphadenopathy appriciated.  Symmetrical Chest wall movement, Good air movement bilaterally, CTAB RRR,No Gallops,Rubs or new Murmurs, No Parasternal Heave +ve B.Sounds, Abd Soft, Non tender, No organomegaly appriciated, No rebound -guarding or rigidity. No Cyanosis, Clubbing or edema, No new Rash or bruise  DISCHARGE CONDITION: Stable  DISPOSITION: Home with home health services  DISCHARGE INSTRUCTIONS:    Activity:  As tolerated with Full fall precautions use walker/cane & assistance as needed  Get Medicines reviewed and adjusted: Please take all your medications with you for your next visit with your Primary MD  Please request your Primary MD to go over all hospital tests and procedure/radiological results at the follow up, please ask your Primary MD to get all Hospital records sent to his/her office.  If you experience worsening of your admission symptoms, develop shortness of breath, life threatening emergency, suicidal or homicidal thoughts you must seek  medical attention immediately by calling 911 or calling your MD immediately  if symptoms less severe.  You must read complete instructions/literature along with all the possible adverse reactions/side effects for all the Medicines you take and that have been prescribed to you. Take any new Medicines after you have completely understood and accpet all the possible adverse reactions/side effects.   Do not drive when taking Pain medications.   Do not take more than prescribed Pain, Sleep and Anxiety Medications  Special Instructions: If you have smoked or chewed Tobacco  in the last 2 yrs please stop  smoking, stop any regular Alcohol  and or any Recreational drug use.  Wear Seat belts while driving.  Please note  You were cared for by a hospitalist during your hospital stay. Once you are discharged, your primary care physician will handle any further medical issues. Please note that NO REFILLS for any discharge medications will be authorized once you are discharged, as it is imperative that you return to your primary care physician (or establish a relationship with a primary care physician if you do not have one) for your aftercare needs so that they can reassess your need for medications and monitor your lab values.   Diet recommendation: Heart Healthy diet  Discharge Instructions    (HEART FAILURE PATIENTS) Call MD:  Anytime you have any of the following symptoms: 1) 3 pound weight gain in 24 hours or 5 pounds in 1 week 2) shortness of breath, with or without a dry hacking cough 3) swelling in the hands, feet or stomach 4) if you have to sleep on extra pillows at night in order to breathe.    Complete by:  As directed      Diet - low sodium heart healthy    Complete by:  As directed      Heart Failure patients record your daily weight using the same scale at the same time of day    Complete by:  As directed      Increase activity slowly    Complete by:  As directed             Follow-up Information    Follow up with Glo Herring., MD.   Specialty:  Internal Medicine   Contact information:   [address] [place] [id number] [phone]       Follow up with Glori Bickers, MD. Go on 11/30/2015.   Specialty:  Cardiology   Why:  at 1040 am in the [facility]--gate code 2000--please bring all medication to appt   Contact information:   [place] [address] [phone]       Follow up with Glo Herring., MD. Schedule an appointment as soon as possible for a visit in 1 week.   Specialty:  Internal Medicine   Why:  Hospital follow up   Contact information:   [address] [place] [id number] [phone]       Follow up with Collene Gobble., MD. Schedule an appointment as soon as possible for a visit in 2 weeks.   Specialty:  Pulmonary Disease   Why:  Hospital follow up   Contact information:   [address] [place] [address] [phone]      Total Time spent on discharge equals  45 minutes.  SignedOren Binet 11/22/2015 9:37 AM

## 2015-11-22 NOTE — Progress Notes (Signed)
Advanced Heart Failure Rounding Note  Referring Physician: Dr Verlon Au Primary Physician: Dr Gerarda Fraction  Primary Cardiologist: Dr Domenic Polite  Pulmonology: Dr Lamonte Sakai.   Reason for Consultation: Pulmonary HTN   Subjective:    VQ scan 11/20/15: Low probability for pulmonary embolus (less than 20%). Patchy areas of decreased ventilation with matched perfusion defects.  CXR 11/20/15: Improving vascular congestion compared to CXR 3/31.   Out 3 L and down 3 lbs overnight with IV lasix 80 mg BID. Creatinine stable. (weight down 13 pounds overall(   Objective:   Weight Range: 102.558 kg (226 lb 1.6 oz) Body mass index is 40.06 kg/(m^2).   Vital Signs:   Temp:  [97.8 F (36.6 C)-98.1 F (36.7 C)] 97.9 F (36.6 C) (04/05 0557) Pulse Rate:  [62-75] 66 (04/05 0557) Resp:  [18] 18 (04/05 0557) BP: (104-117)/(51-66) 107/51 mmHg (04/05 0557) SpO2:  [90 %-94 %] 92 % (04/05 0557) Weight:  [102.558 kg (226 lb 1.6 oz)] 102.558 kg (226 lb 1.6 oz) (04/05 0557) Last BM Date: 11/20/15  Weight change: Filed Weights   11/20/15 0610 11/21/15 0533 11/22/15 0557  Weight: 105.733 kg (233 lb 1.6 oz) 103.964 kg (229 lb 3.2 oz) 102.558 kg (226 lb 1.6 oz)    Intake/Output:   Intake/Output Summary (Last 24 hours) at 11/22/15 0627 Last data filed at 11/22/15 0558  Gross per 24 hour  Intake    720 ml  Output   2850 ml  Net  -2130 ml     Physical Exam: General: Well appearing. No resp difficulty. Lying flat bed  HEENT: normal Neck: supple. JVP 7-8 Carotids 2+ bilat; no bruits. No thyromegaly or nodule noted. Cor: PMI nondisplaced. Irregular rate & rhythm. Brady No rubs, gallops or murmurs. Lungs: Bibasilar crackles, normal effort Abdomen: obese, soft, NT, ND, no HSM. No bruits or masses. +BS  Extremities: no cyanosis, clubbing, rash, R and LLE trace edema.  Neuro: alert & orientedx3, cranial nerves grossly intact. moves all 4 extremities w/o difficulty. Affect pleasant  Telemetry: A Fib 50-60s  ,  Labs: CBC  Recent Labs  11/21/15 0518 11/22/15 0231  WBC 4.2 4.7  HGB 10.5* 10.7*  HCT 34.2* 34.5*  MCV 95.3 94.3  PLT 130* [id number]*   Basic Metabolic Panel  Recent Labs  11/21/15 0518 11/22/15 0231  NA 143 142  K 4.4 4.3  CL 101 99*  CO2 31 31  GLUCOSE 78 79  BUN 35* 38*  CREATININE 1.17* 1.12*  CALCIUM 9.8 9.9  MG  --  2.1   Liver Function Tests  Recent Labs  11/22/15 0231  AST 21  ALT 13*  ALKPHOS 50  BILITOT 1.0  PROT 6.2*  ALBUMIN 3.6   No results for input(s): LIPASE, AMYLASE in the last 72 hours. Cardiac Enzymes No results for input(s): CKTOTAL, CKMB, CKMBINDEX, TROPONINI in the last 72 hours.  BNP: BNP (last 3 results)  Recent Labs  11/17/15 1501  BNP 270.0*    ProBNP (last 3 results) No results for input(s): PROBNP in the last 8760 hours.   D-Dimer No results for input(s): DDIMER in the last 72 hours. Hemoglobin A1C No results for input(s): HGBA1C in the last 72 hours. Fasting Lipid Panel No results for input(s): CHOL, HDL, LDLCALC, TRIG, CHOLHDL, LDLDIRECT in the last 72 hours. Thyroid Function Tests No results for input(s): TSH, T4TOTAL, T3FREE, THYROIDAB in the last 72 hours.  Invalid input(s): FREET3  Other results:     Imaging/Studies:  Dg Chest 2 View  11/20/2015  CLINICAL DATA:  Patient admitted 3 days ago with shortness of breath. EXAM: CHEST  2 VIEW COMPARISON:  11/17/2015. FINDINGS: Cardiomegaly. Improving vascular congestion. Trace BILATERAL effusions are also improved. No active infiltrates or failure. No acute osseous findings. IMPRESSION: Improving vascular congestion compared with 3/31.  No new findings. Electronically Signed   By: Staci Righter M.D.   On: 11/20/2015 14:29   Nm Pulmonary Perf And Vent  11/20/2015  CLINICAL DATA:  81 year old female with acute shortness of breath for 3 days. EXAM: NUCLEAR MEDICINE VENTILATION - PERFUSION LUNG SCAN TECHNIQUE: Ventilation images were obtained in multiple projections  using inhaled aerosol Tc-17m DTPA. Perfusion images were obtained in multiple projections after intravenous injection of Tc-63m MAA. RADIOPHARMACEUTICALS:  31.0 mCi Technetium-82m DTPA aerosol inhalation and 4.0 mCi Technetium-59m MAA IV COMPARISON:  11/20/2015 chest radiograph FINDINGS: Ventilation: Patchy areas of decreased ventilation identified. Perfusion: Patchy areas of decreased perfusion match ventilation. No perfusion/ ventilation mismatches are identified. IMPRESSION: Low probability for pulmonary embolus (less than 20%) Electronically Signed   By: Margarette Canada M.D.   On: 11/20/2015 14:27    Latest Echo  Latest Cath   Medications:     Scheduled Medications: . acidophilus   Oral BID  . ALPRAZolam  0.75 mg Oral QHS  . ambrisentan  5 mg Oral Daily  . aspirin EC  81 mg Oral Daily  . atorvastatin  40 mg Oral Daily  . brimonidine  1 drop Both Eyes BID  . brinzolamide  1 drop Both Eyes TID  . fluticasone  2 spray Each Nare Daily  . furosemide  80 mg Intravenous BID  . latanoprost  1 drop Right Eye QHS  . pantoprazole  40 mg Oral Q1200  . potassium chloride  40 mEq Oral BID  . sodium chloride flush  3 mL Intravenous Q12H  . Tadalafil (PAH)  40 mg Oral Daily  . timolol  1 drop Both Eyes BID  . warfarin  5 mg Oral q1800  . Warfarin - Pharmacist Dosing Inpatient   Does not apply q1800    Infusions:    PRN Medications: sodium chloride, acetaminophen **OR** acetaminophen, albuterol, HYDROcodone-acetaminophen, lidocaine, nitroGLYCERIN, ondansetron **OR** ondansetron (ZOFRAN) IV, polyethylene glycol, sodium chloride flush, technetium albumin aggregated, technetium TC 67M diethylenetriame-pentaacetic acid   Assessment/Plan   [name] is a 81 year old with history of pulmonary HTN on letaris and tadalafil, chronic A fib on coumadin, HTN, CAD BMS LAD 2006, obesity, psoriasis, bradycardia, and severe TR admitted with increased dyspnea on exertion and lower extremity edema. Sleep  test negative but does use oxygen at night.   1. Acute on chronic diastolic CHF: ECho 0000000 EF 55-60%, PA peak pressure 67 mm Hg - Has diuresed well  2. Atrial fibrillation: Chronic, HR low at times and now off all nodal blockers. - Continue warfarin.  3. PAH: No cath since 2013, recently started on Adcirca and Letairis (which likely triggered the CHF exacerbation). She had a negative sleep study in the past and has had basically unremarkable chest CTs (other than nodules). Some mention of ?chronic PE but this was never documented. There has been some concern that [place] could be rheumatologic-related, she has history of psoriasis.  - Needs RHC when more dry (will  do as outpatient).  - Continue Adcirca and Letairis at current doses for now.  - V/Q scan low probability. No Ventilation/perfusion mismatch appreciated.  - Should probably get rheumatologic serologies also.  4. CAD: Stable, continue statin. No ASA  given warfarin.  5. Hypotension: Hold losartan for now.    Volume status improved. Renal function stable. Can go home today. Switch lasix to demadex 40 bid. Increase KCL to 20 bid. Hold losartan for now. Reinforced need for daily weights and reviewed use of sliding scale diuretics.  Can cut demadex back to 40/20 as needed. Will see in HF Clinic next week and plan RHC. Continue Adcirca/letairis  Length of Stay: 5  Bensimhon, Daniel MD  11/22/2015, 6:27 AM  Advanced Heart Failure Team Pager [phone] (M-F; 7a - 4p)  Please contact [facility] for night-coverage after hours (4p -7a ) and weekends on [email or web address]

## 2015-11-23 ENCOUNTER — Telehealth: Payer: Self-pay | Admitting: Emergency Medicine

## 2015-11-23 DIAGNOSIS — Z9981 Dependence on supplemental oxygen: Secondary | ICD-10-CM | POA: Diagnosis not present

## 2015-11-23 DIAGNOSIS — I4891 Unspecified atrial fibrillation: Secondary | ICD-10-CM | POA: Diagnosis not present

## 2015-11-23 DIAGNOSIS — Z959 Presence of cardiac and vascular implant and graft, unspecified: Secondary | ICD-10-CM | POA: Diagnosis not present

## 2015-11-23 DIAGNOSIS — Z5181 Encounter for therapeutic drug level monitoring: Secondary | ICD-10-CM | POA: Diagnosis not present

## 2015-11-23 DIAGNOSIS — I272 Other secondary pulmonary hypertension: Secondary | ICD-10-CM | POA: Diagnosis not present

## 2015-11-23 DIAGNOSIS — E669 Obesity, unspecified: Secondary | ICD-10-CM | POA: Diagnosis not present

## 2015-11-23 DIAGNOSIS — Z6838 Body mass index (BMI) 38.0-38.9, adult: Secondary | ICD-10-CM | POA: Diagnosis not present

## 2015-11-23 DIAGNOSIS — E785 Hyperlipidemia, unspecified: Secondary | ICD-10-CM | POA: Diagnosis not present

## 2015-11-23 DIAGNOSIS — I10 Essential (primary) hypertension: Secondary | ICD-10-CM | POA: Diagnosis not present

## 2015-11-23 DIAGNOSIS — I2510 Atherosclerotic heart disease of native coronary artery without angina pectoris: Secondary | ICD-10-CM | POA: Diagnosis not present

## 2015-11-23 DIAGNOSIS — R911 Solitary pulmonary nodule: Secondary | ICD-10-CM | POA: Diagnosis not present

## 2015-11-23 DIAGNOSIS — I5023 Acute on chronic systolic (congestive) heart failure: Secondary | ICD-10-CM | POA: Diagnosis not present

## 2015-11-23 DIAGNOSIS — Z7901 Long term (current) use of anticoagulants: Secondary | ICD-10-CM | POA: Diagnosis not present

## 2015-11-23 NOTE — Telephone Encounter (Signed)
Pt started Adcirca on 11/06/15- states she took this medication with her to an office visit in a ziploc bag.  When pt was there the bag of her medication fell out of her walker, and she ran over several of her pills which crushed them.  Pt states she has several pills left but is concerned that she will go without her medication for several days.  I've asked pt to count out exactly how many pills she has left so we can see if we can supplement this deficit with samples in the office, or if we need to call pharmacy for an early fill.  Pt will call back tomorrow with pill count.  Will await call back.

## 2015-11-24 NOTE — Telephone Encounter (Signed)
Pt returning call.[name] ° °

## 2015-11-24 NOTE — Telephone Encounter (Signed)
ATC pt x 3. Line busy. WCB. We do not have any Adcirca samples.

## 2015-11-27 DIAGNOSIS — I5031 Acute diastolic (congestive) heart failure: Secondary | ICD-10-CM | POA: Diagnosis not present

## 2015-11-27 DIAGNOSIS — J449 Chronic obstructive pulmonary disease, unspecified: Secondary | ICD-10-CM | POA: Diagnosis not present

## 2015-11-27 DIAGNOSIS — I272 Other secondary pulmonary hypertension: Secondary | ICD-10-CM | POA: Diagnosis not present

## 2015-11-27 DIAGNOSIS — Z1389 Encounter for screening for other disorder: Secondary | ICD-10-CM | POA: Diagnosis not present

## 2015-11-27 DIAGNOSIS — Z6839 Body mass index (BMI) 39.0-39.9, adult: Secondary | ICD-10-CM | POA: Diagnosis not present

## 2015-11-27 NOTE — Telephone Encounter (Signed)
ATC pt, no answer, WCB Please let her know that a sample of Adcirca has been placed up front.  Message may be closed once patient is aware.

## 2015-11-28 NOTE — Telephone Encounter (Signed)
Spoke with pt. She is aware that a sample is ready for her to pick up. Nothing further was needed.

## 2015-11-30 ENCOUNTER — Encounter (HOSPITAL_COMMUNITY): Payer: Self-pay | Admitting: *Deleted

## 2015-11-30 ENCOUNTER — Ambulatory Visit (HOSPITAL_COMMUNITY)
Admit: 2015-11-30 | Discharge: 2015-11-30 | Disposition: A | Discharge status: Home / Self Care | Payer: Medicare Other | Source: Ambulatory Visit | Attending: Internal Medicine | Admitting: Internal Medicine

## 2015-11-30 ENCOUNTER — Telehealth: Payer: Self-pay | Admitting: Emergency Medicine

## 2015-11-30 VITALS — BP 132/66 | HR 61 | Wt 224.8 lb

## 2015-11-30 DIAGNOSIS — L409 Psoriasis, unspecified: Secondary | ICD-10-CM | POA: Diagnosis not present

## 2015-11-30 DIAGNOSIS — I272 Other secondary pulmonary hypertension: Secondary | ICD-10-CM | POA: Diagnosis not present

## 2015-11-30 DIAGNOSIS — Z7982 Long term (current) use of aspirin: Secondary | ICD-10-CM | POA: Diagnosis not present

## 2015-11-30 DIAGNOSIS — K219 Gastro-esophageal reflux disease without esophagitis: Secondary | ICD-10-CM | POA: Insufficient documentation

## 2015-11-30 DIAGNOSIS — I482 Chronic atrial fibrillation: Secondary | ICD-10-CM | POA: Insufficient documentation

## 2015-11-30 DIAGNOSIS — Z7901 Long term (current) use of anticoagulants: Secondary | ICD-10-CM | POA: Insufficient documentation

## 2015-11-30 DIAGNOSIS — I252 Old myocardial infarction: Secondary | ICD-10-CM | POA: Insufficient documentation

## 2015-11-30 DIAGNOSIS — I2510 Atherosclerotic heart disease of native coronary artery without angina pectoris: Secondary | ICD-10-CM | POA: Insufficient documentation

## 2015-11-30 DIAGNOSIS — I5032 Chronic diastolic (congestive) heart failure: Secondary | ICD-10-CM | POA: Diagnosis not present

## 2015-11-30 DIAGNOSIS — Z825 Family history of asthma and other chronic lower respiratory diseases: Secondary | ICD-10-CM | POA: Diagnosis not present

## 2015-11-30 DIAGNOSIS — Z79899 Other long term (current) drug therapy: Secondary | ICD-10-CM | POA: Insufficient documentation

## 2015-11-30 DIAGNOSIS — Z87891 Personal history of nicotine dependence: Secondary | ICD-10-CM | POA: Insufficient documentation

## 2015-11-30 DIAGNOSIS — E785 Hyperlipidemia, unspecified: Secondary | ICD-10-CM | POA: Diagnosis not present

## 2015-11-30 DIAGNOSIS — Z955 Presence of coronary angioplasty implant and graft: Secondary | ICD-10-CM | POA: Insufficient documentation

## 2015-11-30 DIAGNOSIS — Z8249 Family history of ischemic heart disease and other diseases of the circulatory system: Secondary | ICD-10-CM | POA: Insufficient documentation

## 2015-11-30 DIAGNOSIS — I110 Hypertensive heart disease with heart failure: Secondary | ICD-10-CM | POA: Insufficient documentation

## 2015-11-30 DIAGNOSIS — Z9981 Dependence on supplemental oxygen: Secondary | ICD-10-CM | POA: Insufficient documentation

## 2015-11-30 LAB — CBC
HCT: 36.7 % (ref 36.0–46.0)
Hemoglobin: 12.1 g/dL (ref 12.0–15.0)
MCH: 30.7 pg (ref 26.0–34.0)
MCHC: 33 g/dL (ref 30.0–36.0)
MCV: 93.1 fL (ref 78.0–100.0)
PLATELETS: 120 10*3/uL — AB (ref 150–400)
RBC: 3.94 MIL/uL (ref 3.87–5.11)
RDW: 14.1 % (ref 11.5–15.5)
WBC: 4.3 10*3/uL (ref 4.0–10.5)

## 2015-11-30 LAB — BASIC METABOLIC PANEL
ANION GAP: 12 (ref 5–15)
BUN: 39 mg/dL — AB (ref 6–20)
CHLORIDE: 103 mmol/L (ref 101–111)
CO2: 26 mmol/L (ref 22–32)
CREATININE: 0.92 mg/dL (ref 0.44–1.00)
Calcium: 9.9 mg/dL (ref 8.9–10.3)
GFR calc non Af Amer: 58 mL/min — ABNORMAL LOW (ref >60)
GLUCOSE: 93 mg/dL (ref 65–99)
POTASSIUM: 4.1 mmol/L (ref 3.5–5.1)
Sodium: 141 mmol/L (ref 135–145)

## 2015-11-30 LAB — PROTIME-INR
INR: 2.07 — ABNORMAL HIGH (ref 0.00–1.49)
Prothrombin Time: 23.1 seconds — ABNORMAL HIGH (ref 11.6–15.2)

## 2015-11-30 MED ORDER — AMBRISENTAN 5 MG PO TABS: 5.0000 mg | ORAL_TABLET | Freq: Every day | ORAL | Status: DC

## 2015-11-30 NOTE — Progress Notes (Signed)
Advanced Heart Failure Medication Review by a Pharmacist  Does the patient  feel that his/her medications are working for him/her?  yes  Has the patient been experiencing any side effects to the medications prescribed?  no  Does the patient measure his/her own blood pressure or blood glucose at home?  yes   Does the patient have any problems obtaining medications due to transportation or finances?   no  Understanding of regimen: good Understanding of indications: good Potential of compliance: good Patient understands to avoid NSAIDs. Patient understands to avoid decongestants.  Issues to address at subsequent visits: None   Pharmacist comments: [name] is a pleasant 81 yo F presenting with her husband and her medication bottles. She reports good compliance with her regimen and states that since her most recent hospitalization with switch to torsemide she has not had any peripheral edema. She did not have any other medication-related questions or concerns for me at this time.   [name]. Velva Harman, PharmD, BCPS, CPP Clinical Pharmacist Pager: [phone] Phone: [phone] 11/30/2015 11:12 AM      Time with patient: 8 minutes Preparation and documentation time: 2 minutes Total time: 10 minutes

## 2015-11-30 NOTE — Progress Notes (Signed)
Patient ID: [name], female   DOB: 1935-04-05, 81 y.o.   MRN: [id number]    Advanced Heart Failure Clinic Note   Primary Physician: Dr Gerarda Fraction  Primary Cardiologist: Dr Domenic Polite  Pulmonology: Dr Lamonte Sakai.   HPI:  [name] is a 81 year old with history of pulmonary HTN on letaris and tadalafil, chronic A fib on coumadin, HTN, CAD BMS LAD 2006, obesity, psoriasis, bradycardia, and severe TR  In October 2016 she was evaluated by cardiology for bradycardia. At that point PPM was not pursued.   She was evaluated by Dr Lamonte Sakai February 20th and started on [place]. She began to have leg edema after this.   Was admitted from 3/31 - 11/22/2015 with volume overload. She diuresed 9 L and down 10 lbs from admit. Lasix switched to torsemide 40 mg daily for home.   Discharge weight 226 lbs.  She presents today for post hospital follow up.  Continues to feel better since she has been home. Down 2 lbs from discharge weight. Weight at home stable at 220 lbs. Breathing has improved a lot since when she was in hospital. Walks around the house for 10 minutes 3-4 x a day. Peeing better on torsemide and lasix, urine remains clear.  Denies CP or PND. Sleeps on an incline chronically with GERD.  Sleeps with 02 on 2.5 L, sleeps well.   Echo 11/18/15 EF 55-60%, PA peak pressure 67 mm Hg  VQ scan 11/20/15: Low probability for pulmonary embolus (less than 20%). Patchy areas of decreased ventilation with matched perfusion defects.  Past Medical History  Diagnosis Date  . Essential hypertension   . Hyperlipidemia   . Chronic atrial fibrillation ([place])     Since 2011  . CAD (coronary artery disease)     BMS to LAD 07/2005  . Morbid obesity ([place])   . Psoriasis   . Right ventricular dysfunction   . Tricuspid regurgitation     Severe  . Pulmonary hypertension (HCC)     Mixed, PASP 67 mmHg at [facility] 2013  . GERD (gastroesophageal reflux disease)   . A-fib ([place])   . Myocardial infarction ([place])  07/2005  . Degenerative arthritis of right knee   . Arthritis     "knees" (11/17/2015)  . On home oxygen therapy     "2.5L at night" (11/17/2015)    Current Outpatient Prescriptions  Medication Sig Dispense Refill  . ALPRAZolam (XANAX) 0.5 MG tablet Take 0.5 mg by mouth at bedtime.     [name] alum hydroxide-mag trisilicate (GAVISCON) [id number] MG CHEW chewable tablet Chew 1 tablet by mouth 3 (three) times daily as needed for indigestion or heartburn.    [name] ambrisentan (LETAIRIS) 5 MG tablet Take 5 mg by mouth daily.    [name] aspirin 81 MG tablet Take 81 mg by mouth daily.      [name] atorvastatin (LIPITOR) 80 MG tablet TAKE 1/2 TABLET BY MOUTH DAILY 45 tablet 2  . AZOPT 1 % ophthalmic suspension Place 1 drop into both eyes 3 (three) times daily.     . brimonidine (ALPHAGAN) 0.2 % ophthalmic solution Place 1 drop into both eyes 2 (two) times daily.     . Coenzyme Q10 (COQ10) 200 MG CAPS Take 200 mg by mouth daily.    . colchicine 0.6 MG tablet Take 0.6 mg by mouth daily.    . fluticasone (FLONASE) 50 MCG/ACT nasal spray Place 2 sprays into both nostrils daily.     [name] HYDROcodone-acetaminophen (NORCO/VICODIN) 5-325  MG tablet Take 1 tablet by mouth 3 (three) times daily as needed for moderate pain.     [name] latanoprost (XALATAN) 0.005 % ophthalmic solution Place 1 drop into the right eye at bedtime.     . lidocaine (XYLOCAINE) 5 % ointment Apply 1 application topically daily as needed (boby pain).     [name] losartan (COZAAR) 100 MG tablet Take 100 mg by mouth every evening.    [name] omeprazole (PRILOSEC) 40 MG capsule Take 40 mg by mouth daily.    . potassium chloride SA (K-DUR,KLOR-CON) 20 MEQ tablet Take 1 tablet (20 mEq total) by mouth 2 (two) times daily. 60 tablet 0  . Probiotic Product (PROBIOTIC PO) Take 1 tablet by mouth 2 (two) times daily.    . Tadalafil, PAH, (ADCIRCA) 20 MG TABS Take 2 tablets (40 mg total) by mouth daily. 60 tablet 3  . timolol (TIMOPTIC) 0.5 % ophthalmic solution Place 1 drop into both eyes 2  (two) times daily.    [name] torsemide (DEMADEX) 20 MG tablet Take 2 tablets (40 mg total) by mouth daily. 180 tablet 0  . warfarin (COUMADIN) 5 MG tablet Take 5 mg by mouth daily.     . nitroGLYCERIN (NITROSTAT) 0.4 MG SL tablet Place 0.4 mg under the tongue every 5 (five) minutes as needed for chest pain (x 3 tablets daily). Reported on 11/30/2015     No current facility-administered medications for this encounter.    No Known Allergies    Social History   Social History  . Marital Status: Married    Spouse Name: N/A  . Number of Children: 2  . Years of Education: N/A   Occupational History  . retired    Social History Main Topics  . Smoking status: Former Smoker -- 0.30 packs/day for .5 years    Types: Cigarettes    Quit date: 08/19/1986  . Smokeless tobacco: Never Used     Comment: pt only smoked for only a few months.  . Alcohol Use: No  . Drug Use: No  . Sexual Activity: No   Other Topics Concern  . Not on file   Social History Narrative      Family History  Problem Relation Age of Onset  . Heart attack Mother   . Coronary artery disease Father   . Stroke Father   . Heart attack Brother   . Heart failure Brother   . COPD Brother   . Emphysema Sister   . Emphysema Brother     Filed Vitals:   11/30/15 1055 11/30/15 1058 11/30/15 1059  BP:  162/74 132/66  Pulse: 61    Weight: 224 lb 12 oz (101.946 kg)    SpO2: 93%      PHYSICAL EXAM: General:  Well appearing. No respiratory difficulty HEENT: normal Neck: supple. JVD difficult to assess with pt girth, does not appear elevated. Carotids 2+ bilat; no bruits. No thyromegaly or nodule noted.  Cor: PMI nondisplaced. RRR. No M/G/R Lungs: CTAB, normal effort Abdomen: Obese, soft, NT, ND, no HSM. No bruits or masses. +BS  Extremities: no cyanosis, clubbing, rash, Trace edema.  Compression hose in place Neuro: alert & oriented x 3, cranial nerves grossly intact. moves all 4 extremities w/o difficulty. Affect  pleasant.   ASSESSMENT & PLAN:  1. Chronic diastolic CHF: Echo 0000000 EF 55-60%, PA peak pressure 67 mm Hg - Continue torsemide 40 mg daily as this is working for her, had initially intended for her to be on 40  mg BID, but seems stable.  - Recheck BMET today. 2. A fib, Chronic - Rate controlled - Stable on warfarin 3. PAH - No cath since 2013, recently started on Adcirca and Letairis (which likely triggered the CHF exacerbation). She had a negative sleep study in the past and has had basically unremarkable chest CTs (other than nodules). Some mention of ?chronic PE but this was never documented. There has been some concern that [place] could be rheumatologic-related, she has history of psoriasis. - VQ scan 11/20/15: Low probability for pulmonary embolus (less than 20%). Patchy areas of decreased ventilation with matched perfusion defects. - Refer to pulmonary rehab.  - Will schedule for RHC next week. 4. CAD - Stable. Continue atorvastatin 40 mg daily. No ASA on warfarin. 5. HTN - Recheck SBP in 130s. Continue current meds for now.   RHC next week. Refer to pulmonary rehab. Recheck BMET today.  Legrand Como "Jonni Sanger" Orient, PA-C 11/30/2015    Patient seen and examined with Oda Kilts, PA-C. We discussed all aspects of the encounter. I agree with the assessment and plan as stated above.   Doing well. Volume status much improved. Continue torsemide 40 daily. Plan RHC next week to further assess PAH. Refer to pulmonary rehab.   Bensimhon, Daniel,MD 5:25 PM

## 2015-11-30 NOTE — Telephone Encounter (Signed)
Patients husband came by the office to pick up samples and did not have samples for Letairis; please see previous phone notes. I gave samples to him and reminded him of patients appt on Wednesday 12-06-15 with Eric Form. Nothing more needed at this time.

## 2015-11-30 NOTE — Patient Instructions (Signed)
Labs today  Right Heart Catheterization on Thursday 6/20, see instruction sheet  You have been referred to Pulmonary Rehab at [facility], they will contact you to schedule  Your physician recommends that you schedule a follow-up appointment in: 3-4 weeks

## 2015-12-01 DIAGNOSIS — I2510 Atherosclerotic heart disease of native coronary artery without angina pectoris: Secondary | ICD-10-CM | POA: Diagnosis not present

## 2015-12-01 DIAGNOSIS — I4891 Unspecified atrial fibrillation: Secondary | ICD-10-CM | POA: Diagnosis not present

## 2015-12-01 DIAGNOSIS — E785 Hyperlipidemia, unspecified: Secondary | ICD-10-CM | POA: Diagnosis not present

## 2015-12-01 DIAGNOSIS — I5023 Acute on chronic systolic (congestive) heart failure: Secondary | ICD-10-CM | POA: Diagnosis not present

## 2015-12-01 DIAGNOSIS — I272 Other secondary pulmonary hypertension: Secondary | ICD-10-CM | POA: Diagnosis not present

## 2015-12-01 DIAGNOSIS — I10 Essential (primary) hypertension: Secondary | ICD-10-CM | POA: Diagnosis not present

## 2015-12-04 ENCOUNTER — Other Ambulatory Visit: Payer: Self-pay | Admitting: *Deleted

## 2015-12-04 DIAGNOSIS — E785 Hyperlipidemia, unspecified: Secondary | ICD-10-CM | POA: Diagnosis not present

## 2015-12-04 DIAGNOSIS — I10 Essential (primary) hypertension: Secondary | ICD-10-CM | POA: Diagnosis not present

## 2015-12-04 DIAGNOSIS — I4891 Unspecified atrial fibrillation: Secondary | ICD-10-CM | POA: Diagnosis not present

## 2015-12-04 DIAGNOSIS — I5023 Acute on chronic systolic (congestive) heart failure: Secondary | ICD-10-CM | POA: Diagnosis not present

## 2015-12-04 DIAGNOSIS — I272 Other secondary pulmonary hypertension: Secondary | ICD-10-CM | POA: Diagnosis not present

## 2015-12-04 DIAGNOSIS — I2510 Atherosclerotic heart disease of native coronary artery without angina pectoris: Secondary | ICD-10-CM | POA: Diagnosis not present

## 2015-12-04 NOTE — Patient Outreach (Signed)
[facility]) Care Management  12/04/2015  [name] 1935/02/27 [id number]  Referral from dashboard-[place] on EMMI-Heart failure:  Telephone call to patient; no answer; unable to leave message;  Plan: will follow up. Sherrin Daisy, RN BSN [place] Management Coordinator [facility] Care Management  [phone]

## 2015-12-05 ENCOUNTER — Other Ambulatory Visit: Payer: Self-pay | Admitting: *Deleted

## 2015-12-05 NOTE — Patient Outreach (Signed)
[facility]) Care Management  12/05/2015  [place] [birth year] [id number]   Emmi-Heart Failure referral via dashboard: Weight was listed at 229 pounds on dashboard.   Telephone call to patient who was advised of reason for call. States her weight had been stable at 220 pounds since discharge from hospital on 11/22/2015.  Patient states her weight was 220 pounds on 04/16 & not 229. States she is weighing daily and understands what the guidelines are when she gains weight. States her water intake is limited to 2 quarts daily. Also states her fluid pill was changed in hospital. States she has no problems getting prescriptions filled & is taking medications consistently as ordered by MD. States using local pharmacy who delivers to her home.  Patient voices that she has already seen primary care doctor on 12/04/2015 in follow up from hospital stay. States daughter or spouse usually takes her to MD appointments. States she has [facility] in place & RN visits twice weekly. States she will have outpatient cardiac rehabilitation set up in the near future when she gets stronger.  No case management needs assessed at this time. EMMI-dashboard has been addressed.  Plan: Close case.  Sherrin Daisy, RN BSN [place] Management Coordinator [facility] Care Management  [phone]

## 2015-12-06 ENCOUNTER — Ambulatory Visit (INDEPENDENT_AMBULATORY_CARE_PROVIDER_SITE_OTHER): Payer: Medicare Other | Admitting: Acute Care

## 2015-12-06 ENCOUNTER — Encounter: Payer: Self-pay | Admitting: Acute Care

## 2015-12-06 VITALS — BP 132/78 | HR 60 | Ht 63.0 in | Wt 225.0 lb

## 2015-12-06 DIAGNOSIS — I2510 Atherosclerotic heart disease of native coronary artery without angina pectoris: Secondary | ICD-10-CM

## 2015-12-06 DIAGNOSIS — R918 Other nonspecific abnormal finding of lung field: Secondary | ICD-10-CM

## 2015-12-06 DIAGNOSIS — I272 Other secondary pulmonary hypertension: Secondary | ICD-10-CM | POA: Diagnosis not present

## 2015-12-06 NOTE — Patient Instructions (Signed)
It is nice to meet you today. I am glad you are feeling better. Continue to take your tadalafil and your ambrisentan Remember not to stop your pulmonary hypertension medications suddenly. Continue taking them as directed. Continue weighing yourself daily Continue following the CHF clinic's recommendations for weight gain. Good luck with your right heart cath tomorrow. Dr. Lamonte Sakai will follow up regarding the results of the cath. Continue wearing your oxygen at night Continue using your flonase and nasal saline for your seasonal allergies Follow up with Dr. Lamonte Sakai in 2 months Please contact office for sooner follow up if symptoms do not improve or worsen or seek emergency care

## 2015-12-06 NOTE — Progress Notes (Signed)
Subjective:    Patient ID: [name], female    DOB: 12-Feb-1935, 81 y.o.   MRN: [id number]  HPI 81 year old woman with a very remote and small tobacco history, history of hypertension, atrial fibrillation, coronary artery disease, psoriasis. She has secondary multifactorial pulmonary hypertension that has been presumed related to obesity with chronic hypoxemia, left-sided heart disease and presumed chronic thromboembolic disease. As noted she does also have a history of autoimmune process, psoriasis. She is not on any targeted [place] therapy. Last seen by Dr Gwenette Greet here in February 2015. She had a CT scan of the chest 10/05/13 that showed pulmonary nodules, the 2 most dominant were in the left upper lobe, and a subsequent PET scan on 04/11/14 showed that these were not hypermetabolic. She wears nocturnal home BiPAP.  Significant events/procedures:  Class III [name] cath 2013 showed this]with Recent start Adcirca 40 mg daily and Letaris 5mg  daily b Dr. Lamonte Sakai Chr Afib , [place] >5  Recent Hospitalization  Admit Date: 11/17/2015 Discharge date: 11/22/2015  Recommendations for Outpatient Follow-up:  1. New Medication-Demadex 2. Please ensure follow-up with cardiology.Right Heart Cath 12/07/2015 3. Please repeat BMET at next visit: Repeated 11/30/2015.  BRIEF HOSPITAL COURSE:  . Acute diastolic heart failure: Admitted and started on intravenous Lasix, CHF team consulted. ECho 11/18/15 EF 55-60%, PA peak pressure 67 mm Hg.Significantly more compensated, recommendations are to transition to [facility] on discharge. Outpatient appointment with cardiology/CHF clinic made.   [name] History of pulmonary hypertension: Followed by Dr. Ellouise Newer on tadalafil and ambrisentan on mid-March. Cardiology plans a repeat RHC when more stable in the outpatient setting. VQ scan was done which was of low probability. Defer further workup/monitor to the outpatient setting. Right Heart Cath scheduled for  12/07/2015  Moderate to severe pulmonary hypertension class III-will need further management as an outpatient with pulmonary-discussed in person with Dr. Lamonte Sakai as patient had appointment dated 11/21/15 at his office. VQ scan negative for pulmonary embolism.autoimmune workup suggested by cardiology-defer to pulmonology as an outpatient to be performed-unlikely at age 5 would benefit from immunomodulators?  . Essential hypertension: Hold losartan on discharge,.   . Pulmonary nodules: Continue outpatient follow-up with pulmonology / Will need repeat scan 09/2016.  . Atrial fibrillation: Rate controlled, continue Coumadin per pharmacy.   [name] GERD: Continue PPI.   [name] Hyperlipidemia: Continue statin.   [name] CAD (coronary artery disease): Continue aspirin and statin.     12/06/2015 Hospital follow up: Patient presents to the office today for hospital follow-up. She states she developed congestive heart failure after being started on pulmonary hypertension meds in mid-March. She states that her Lasix did not work in conjunction with these medications, causing her to go into heart failure. She was managed by the congestive heart failure team at [facility]. She was diuresed with Lasix for a total of 8 L removed during her hospitalization with a net weight loss of 6 pounds from admission. She was transitioned from Lasix to [facility]. She is weighing each morning religiously, and following instructions per congestive heart failure clinic for management of weight gain. She states she feels great. She is using her nocturnal oxygen, and is scheduled for a right heart cath tomorrow, 12/07/2015. She denies chest pain, orthopnea, hemoptysis, leg or calf pain. She denies purulent sputum and fever. She is watching her salt intake, and eating a heart healthy diet.   Current outpatient prescriptions:  .  ALPRAZolam (XANAX) 0.5 MG tablet, Take 0.5 mg by mouth at bedtime. , Disp: ,  Rfl:  .  alum hydroxide-mag trisilicate  (GAVISCON) [id number] MG CHEW chewable tablet, Chew 1 tablet by mouth 3 (three) times daily as needed for indigestion or heartburn., Disp: , Rfl:  .  ambrisentan (LETAIRIS) 5 MG tablet, Take 1 tablet (5 mg total) by mouth daily., Disp: 20 tablet, Rfl: 0 .  aspirin 81 MG tablet, Take 81 mg by mouth daily.  , Disp: , Rfl:  .  atorvastatin (LIPITOR) 80 MG tablet, TAKE 1/2 TABLET BY MOUTH DAILY, Disp: 45 tablet, Rfl: 2 .  AZOPT 1 % ophthalmic suspension, Place 1 drop into both eyes 3 (three) times daily. , Disp: , Rfl:  .  brimonidine (ALPHAGAN) 0.2 % ophthalmic solution, Place 1 drop into both eyes 2 (two) times daily. , Disp: , Rfl:  .  Coenzyme Q10 (COQ10) 200 MG CAPS, Take 200 mg by mouth daily., Disp: , Rfl:  .  colchicine 0.6 MG tablet, Take 0.6 mg by mouth daily., Disp: , Rfl:  .  fluticasone (FLONASE) 50 MCG/ACT nasal spray, Place 2 sprays into both nostrils daily. , Disp: , Rfl:  .  HYDROcodone-acetaminophen (NORCO/VICODIN) 5-325 MG tablet, Take 1 tablet by mouth 3 (three) times daily as needed for moderate pain. , Disp: , Rfl:  .  latanoprost (XALATAN) 0.005 % ophthalmic solution, Place 1 drop into the right eye at bedtime. , Disp: , Rfl:  .  lidocaine (XYLOCAINE) 5 % ointment, Apply 1 application topically daily as needed (boby pain). , Disp: , Rfl:  .  losartan (COZAAR) 100 MG tablet, Take 100 mg by mouth every evening., Disp: , Rfl:  .  nitroGLYCERIN (NITROSTAT) 0.4 MG SL tablet, Place 0.4 mg under the tongue every 5 (five) minutes as needed for chest pain (x 3 tablets daily). Reported on 11/30/2015, Disp: , Rfl:  .  omeprazole (PRILOSEC) 40 MG capsule, Take 40 mg by mouth daily., Disp: , Rfl:  .  potassium chloride SA (K-DUR,KLOR-CON) 20 MEQ tablet, Take 1 tablet (20 mEq total) by mouth 2 (two) times daily., Disp: 60 tablet, Rfl: 0 .  Probiotic Product (PROBIOTIC PO), Take 1 tablet by mouth 2 (two) times daily., Disp: , Rfl:  .  Tadalafil, PAH, (ADCIRCA) 20 MG TABS, Take 2 tablets (40 mg total)  by mouth daily., Disp: 60 tablet, Rfl: 3 .  timolol (TIMOPTIC) 0.5 % ophthalmic solution, Place 1 drop into both eyes 2 (two) times daily., Disp: , Rfl:  .  torsemide (DEMADEX) 20 MG tablet, Take 2 tablets (40 mg total) by mouth daily., Disp: 180 tablet, Rfl: 0 .  warfarin (COUMADIN) 5 MG tablet, Take 5 mg by mouth daily. , Disp: , Rfl:    Past Medical History  Diagnosis Date  . Essential hypertension   . Hyperlipidemia   . Chronic atrial fibrillation ([place])     Since 2011  . CAD (coronary artery disease)     BMS to LAD 07/2005  . Morbid obesity ([place])   . Psoriasis   . Right ventricular dysfunction   . Tricuspid regurgitation     Severe  . Pulmonary hypertension (HCC)     Mixed, PASP 67 mmHg at [facility] 2013  . GERD (gastroesophageal reflux disease)   . A-fib ([place])   . Myocardial infarction ([place]) 07/2005  . Degenerative arthritis of right knee   . Arthritis     "knees" (11/17/2015)  . On home oxygen therapy     "2.5L at night" (11/17/2015)    No Known Allergies    Review of  Systems Constitutional:   No  weight loss, night sweats,  Fevers, chills, fatigue, or  lassitude.  HEENT:   No headaches,  Difficulty swallowing,  Tooth/dental problems, or  Sore throat,                No sneezing, itching, ear ache, nasal congestion, post nasal drip,   CV:  No chest pain,  Orthopnea, PND, swelling in lower extremities, anasarca, dizziness, palpitations, syncope.   GI  No heartburn, indigestion, abdominal pain, nausea, vomiting, diarrhea, change in bowel habits, loss of appetite, bloody stools.   Resp: No shortness of breath with exertion or at rest.  No excess mucus, no productive cough,  No non-productive cough,  No coughing up of blood.  No change in color of mucus.  No wheezing.  No chest wall deformity  Skin: no rash or lesions.  GU: no dysuria, change in color of urine, no urgency or frequency.  No flank pain, no hematuria   MS:  No joint pain or swelling.  No decreased range of  motion.  No back pain.  Psych:  No change in mood or affect. No depression or anxiety.  No memory loss.        Objective:   Physical Exam  BP 132/78 mmHg  Pulse 60  Ht 5\' 3"  (1.6 m)  Wt 225 lb (102.059 kg)  BMI 39.87 kg/m2  SpO2 91%  Physical Exam:  General- No distress,  A&Ox3, uses walker for balance ENT: No sinus tenderness, TM clear, pale nasal mucosa, no oral exudate,no post nasal drip, no LAN Cardiac: S1, S2, regular rate and rhythm, no murmur Chest: No wheeze/ rales/ dullness; no accessory muscle use, no nasal flaring, no sternal retractions Abd.: Soft Non-tender Ext: No clubbing cyanosis, edema Neuro:  normal strength Skin: No rashes, warm and dry Psych: normal mood and behavior Magdalen Spatz, AGACNP-BC [place] 12/06/2015     Assessment & Plan:

## 2015-12-06 NOTE — Assessment & Plan Note (Signed)
Recent hospitalization with acute diastolic heart failure after being started on tadalafil and ambrisentan mid March 2017. Plan: Scheduled for right heart cath 12/07/2015 per cardiology. Continue to take your tadalafil and your ambrisentan Remember not to stop your pulmonary hypertension medications suddenly. Continue taking them as directed. Continue weighing yourself daily Continue following the CHF clinic's recommendations for weight gain. Good luck with your right heart cath tomorrow. Dr. Lamonte Sakai will follow up regarding the results of the cath. Continue wearing your oxygen at night Continue using your flonase and nasal saline for your seasonal allergies Follow up with Dr. Lamonte Sakai in 2 months Please contact office for sooner follow up if symptoms do not improve or worsen or seek emergency care

## 2015-12-06 NOTE — Progress Notes (Signed)
     Re 

## 2015-12-06 NOTE — Assessment & Plan Note (Addendum)
Stable pulmonary nodules: Plan: Follow up scan 09/2016

## 2015-12-07 ENCOUNTER — Encounter (HOSPITAL_COMMUNITY): Payer: Self-pay | Admitting: Internal Medicine

## 2015-12-07 ENCOUNTER — Encounter (HOSPITAL_COMMUNITY)
Admission: RE | Disposition: A | Discharge status: Home / Self Care | Payer: Self-pay | Source: Ambulatory Visit | Attending: Internal Medicine

## 2015-12-07 ENCOUNTER — Ambulatory Visit (HOSPITAL_COMMUNITY)
Admission: RE | Admit: 2015-12-07 | Discharge: 2015-12-07 | Disposition: A | Discharge status: Home / Self Care | Payer: Medicare Other | Source: Ambulatory Visit | Attending: Internal Medicine | Admitting: Internal Medicine

## 2015-12-07 DIAGNOSIS — I110 Hypertensive heart disease with heart failure: Secondary | ICD-10-CM | POA: Diagnosis not present

## 2015-12-07 DIAGNOSIS — I071 Rheumatic tricuspid insufficiency: Secondary | ICD-10-CM | POA: Diagnosis not present

## 2015-12-07 DIAGNOSIS — I252 Old myocardial infarction: Secondary | ICD-10-CM | POA: Diagnosis not present

## 2015-12-07 DIAGNOSIS — M1711 Unilateral primary osteoarthritis, right knee: Secondary | ICD-10-CM | POA: Diagnosis not present

## 2015-12-07 DIAGNOSIS — Z8249 Family history of ischemic heart disease and other diseases of the circulatory system: Secondary | ICD-10-CM | POA: Diagnosis not present

## 2015-12-07 DIAGNOSIS — I272 Other secondary pulmonary hypertension: Secondary | ICD-10-CM | POA: Diagnosis not present

## 2015-12-07 DIAGNOSIS — I482 Chronic atrial fibrillation: Secondary | ICD-10-CM | POA: Diagnosis not present

## 2015-12-07 DIAGNOSIS — K219 Gastro-esophageal reflux disease without esophagitis: Secondary | ICD-10-CM | POA: Insufficient documentation

## 2015-12-07 DIAGNOSIS — I5032 Chronic diastolic (congestive) heart failure: Secondary | ICD-10-CM | POA: Insufficient documentation

## 2015-12-07 DIAGNOSIS — Z9981 Dependence on supplemental oxygen: Secondary | ICD-10-CM | POA: Insufficient documentation

## 2015-12-07 DIAGNOSIS — I2721 Secondary pulmonary arterial hypertension: Secondary | ICD-10-CM | POA: Insufficient documentation

## 2015-12-07 DIAGNOSIS — E785 Hyperlipidemia, unspecified: Secondary | ICD-10-CM | POA: Insufficient documentation

## 2015-12-07 DIAGNOSIS — I2510 Atherosclerotic heart disease of native coronary artery without angina pectoris: Secondary | ICD-10-CM | POA: Insufficient documentation

## 2015-12-07 DIAGNOSIS — Z7982 Long term (current) use of aspirin: Secondary | ICD-10-CM | POA: Insufficient documentation

## 2015-12-07 DIAGNOSIS — Z6838 Body mass index (BMI) 38.0-38.9, adult: Secondary | ICD-10-CM | POA: Insufficient documentation

## 2015-12-07 DIAGNOSIS — Z87891 Personal history of nicotine dependence: Secondary | ICD-10-CM | POA: Diagnosis not present

## 2015-12-07 DIAGNOSIS — L409 Psoriasis, unspecified: Secondary | ICD-10-CM | POA: Diagnosis not present

## 2015-12-07 DIAGNOSIS — Z7901 Long term (current) use of anticoagulants: Secondary | ICD-10-CM | POA: Diagnosis not present

## 2015-12-07 HISTORY — PX: CARDIAC CATHETERIZATION: SHX172

## 2015-12-07 LAB — POCT I-STAT 3, VENOUS BLOOD GAS (G3P V)
Acid-Base Excess: 3 mmol/L — ABNORMAL HIGH (ref 0.0–2.0)
Acid-Base Excess: 4 mmol/L — ABNORMAL HIGH (ref 0.0–2.0)
BICARBONATE: 28.7 meq/L — AB (ref 20.0–24.0)
Bicarbonate: 29.5 mEq/L — ABNORMAL HIGH (ref 20.0–24.0)
O2 Saturation: 65 %
O2 Saturation: 66 %
PCO2 VEN: 48.2 mmHg (ref 45.0–50.0)
PH VEN: 7.388 — AB (ref 7.250–7.300)
PH VEN: 7.394 — AB (ref 7.250–7.300)
PO2 VEN: 35 mmHg (ref 31.0–45.0)
PO2 VEN: 35 mmHg (ref 31.0–45.0)
TCO2: 30 mmol/L (ref 0–100)
TCO2: 31 mmol/L (ref 0–100)
pCO2, Ven: 47.6 mmHg (ref 45.0–50.0)

## 2015-12-07 LAB — PROTIME-INR
INR: 2.37 — ABNORMAL HIGH (ref 0.00–1.49)
PROTHROMBIN TIME: 25.6 s — AB (ref 11.6–15.2)

## 2015-12-07 SURGERY — RIGHT HEART CATH
Anesthesia: LOCAL

## 2015-12-07 MED ORDER — SODIUM CHLORIDE 0.9% FLUSH: 3.0000 mL | INTRAVENOUS | Status: DC | PRN

## 2015-12-07 MED ORDER — SODIUM CHLORIDE 0.9 % IV SOLN: 250.0000 mL | INTRAVENOUS | Status: DC | PRN

## 2015-12-07 MED ORDER — LIDOCAINE HCL (PF) 1 % IJ SOLN
INTRAMUSCULAR | Status: AC
  Filled 2015-12-07: qty 30

## 2015-12-07 MED ORDER — HEPARIN (PORCINE) IN NACL 2-0.9 UNIT/ML-% IJ SOLN
INTRAMUSCULAR | Status: AC
  Filled 2015-12-07: qty 500

## 2015-12-07 MED ORDER — ONDANSETRON HCL 4 MG/2ML IJ SOLN: 4.0000 mg | Freq: Four times a day (QID) | INTRAMUSCULAR | Status: DC | PRN

## 2015-12-07 MED ORDER — ACETAMINOPHEN 325 MG PO TABS: 650.0000 mg | ORAL_TABLET | ORAL | Status: DC | PRN

## 2015-12-07 MED ORDER — LIDOCAINE HCL (PF) 1 % IJ SOLN
INTRAMUSCULAR | Status: DC | PRN
  Administered 2015-12-07: 2 mL

## 2015-12-07 MED ORDER — ASPIRIN 81 MG PO CHEW
81.0000 mg | CHEWABLE_TABLET | ORAL | Status: AC
  Administered 2015-12-07: 81 mg via ORAL

## 2015-12-07 MED ORDER — SODIUM CHLORIDE 0.9 % IV SOLN
INTRAVENOUS | Status: DC
  Administered 2015-12-07: 10:00:00 via INTRAVENOUS

## 2015-12-07 MED ORDER — SODIUM CHLORIDE 0.9% FLUSH: 3.0000 mL | Freq: Two times a day (BID) | INTRAVENOUS | Status: DC

## 2015-12-07 MED ORDER — HEPARIN (PORCINE) IN NACL 2-0.9 UNIT/ML-% IJ SOLN
INTRAMUSCULAR | Status: DC | PRN
  Administered 2015-12-07: 500 mL

## 2015-12-07 MED ORDER — SODIUM CHLORIDE 0.9% FLUSH
3.0000 mL | Freq: Two times a day (BID) | INTRAVENOUS | Status: DC
Start: 2015-12-07 — End: 2015-12-07

## 2015-12-07 MED ORDER — ASPIRIN 81 MG PO CHEW
CHEWABLE_TABLET | ORAL | Status: AC
  Administered 2015-12-07: 81 mg via ORAL
  Filled 2015-12-07: qty 1

## 2015-12-07 SURGICAL SUPPLY — 7 items
CATH BALLN WEDGE 5F 110CM (CATHETERS) ×2 IMPLANT
GUIDEWIRE .025 260CM (WIRE) ×2 IMPLANT
KIT HEART RIGHT NAMIC (KITS) ×2 IMPLANT
SHEATH FAST CATH BRACH 5F 5CM (SHEATH) ×2 IMPLANT
TRANSDUCER W/STOPCOCK (MISCELLANEOUS) ×2 IMPLANT
TUBING ART PRESS 72  MALE/FEM (TUBING) ×1
TUBING ART PRESS 72 MALE/FEM (TUBING) ×1 IMPLANT

## 2015-12-07 NOTE — H&P (View-Only) (Signed)
Patient ID: [name], female   DOB: 1934/09/26, 81 y.o.   MRN: [id number]    Advanced Heart Failure Clinic Note   Primary Physician: Dr Gerarda Fraction  Primary Cardiologist: Dr Domenic Polite  Pulmonology: Dr Lamonte Sakai.   HPI:  [name] is a 80 year old with history of pulmonary HTN on letaris and tadalafil, chronic A fib on coumadin, HTN, CAD BMS LAD 2006, obesity, psoriasis, bradycardia, and severe TR  In October 2016 she was evaluated by cardiology for bradycardia. At that point PPM was not pursued.   She was evaluated by Dr Lamonte Sakai February 20th and started on Congo. She began to have leg edema after this.   Was admitted from 3/31 - 11/22/2015 with volume overload. She diuresed 9 L and down 10 lbs from admit. Lasix switched to torsemide 40 mg daily for home.   Discharge weight 226 lbs.  She presents today for post hospital follow up.  Continues to feel better since she has been home. Down 2 lbs from discharge weight. Weight at home stable at 220 lbs. Breathing has improved a lot since when she was in hospital. Walks around the house for 10 minutes 3-4 x a day. Peeing better on torsemide and lasix, urine remains clear.  Denies CP or PND. Sleeps on an incline chronically with GERD.  Sleeps with 02 on 2.5 L, sleeps well.   Echo 11/18/15 EF 55-60%, PA peak pressure 67 mm Hg  VQ scan 11/20/15: Low probability for pulmonary embolus (less than 20%). Patchy areas of decreased ventilation with matched perfusion defects.  Past Medical History  Diagnosis Date  . Essential hypertension   . Hyperlipidemia   . Chronic atrial fibrillation ([place])     Since 2011  . CAD (coronary artery disease)     BMS to LAD 07/2005  . Morbid obesity ([place])   . Psoriasis   . Right ventricular dysfunction   . Tricuspid regurgitation     Severe  . Pulmonary hypertension (HCC)     Mixed, PASP 67 mmHg at [facility] 2013  . GERD (gastroesophageal reflux disease)   . A-fib ([place])   . Myocardial infarction ([place])  07/2005  . Degenerative arthritis of right knee   . Arthritis     "knees" (11/17/2015)  . On home oxygen therapy     "2.5L at night" (11/17/2015)    Current Outpatient Prescriptions  Medication Sig Dispense Refill  . ALPRAZolam (XANAX) 0.5 MG tablet Take 0.5 mg by mouth at bedtime.     [name] alum hydroxide-mag trisilicate (GAVISCON) [id number] MG CHEW chewable tablet Chew 1 tablet by mouth 3 (three) times daily as needed for indigestion or heartburn.    [name] ambrisentan (LETAIRIS) 5 MG tablet Take 5 mg by mouth daily.    [name] aspirin 81 MG tablet Take 81 mg by mouth daily.      [name] atorvastatin (LIPITOR) 80 MG tablet TAKE 1/2 TABLET BY MOUTH DAILY 45 tablet 2  . AZOPT 1 % ophthalmic suspension Place 1 drop into both eyes 3 (three) times daily.     . brimonidine (ALPHAGAN) 0.2 % ophthalmic solution Place 1 drop into both eyes 2 (two) times daily.     . Coenzyme Q10 (COQ10) 200 MG CAPS Take 200 mg by mouth daily.    . colchicine 0.6 MG tablet Take 0.6 mg by mouth daily.    . fluticasone (FLONASE) 50 MCG/ACT nasal spray Place 2 sprays into both nostrils daily.     [name] HYDROcodone-acetaminophen (NORCO/VICODIN) 5-325  MG tablet Take 1 tablet by mouth 3 (three) times daily as needed for moderate pain.     [name] latanoprost (XALATAN) 0.005 % ophthalmic solution Place 1 drop into the right eye at bedtime.     . lidocaine (XYLOCAINE) 5 % ointment Apply 1 application topically daily as needed (boby pain).     [name] losartan (COZAAR) 100 MG tablet Take 100 mg by mouth every evening.    [name] omeprazole (PRILOSEC) 40 MG capsule Take 40 mg by mouth daily.    . potassium chloride SA (K-DUR,KLOR-CON) 20 MEQ tablet Take 1 tablet (20 mEq total) by mouth 2 (two) times daily. 60 tablet 0  . Probiotic Product (PROBIOTIC PO) Take 1 tablet by mouth 2 (two) times daily.    . Tadalafil, PAH, (ADCIRCA) 20 MG TABS Take 2 tablets (40 mg total) by mouth daily. 60 tablet 3  . timolol (TIMOPTIC) 0.5 % ophthalmic solution Place 1 drop into both eyes 2  (two) times daily.    [name] torsemide (DEMADEX) 20 MG tablet Take 2 tablets (40 mg total) by mouth daily. 180 tablet 0  . warfarin (COUMADIN) 5 MG tablet Take 5 mg by mouth daily.     . nitroGLYCERIN (NITROSTAT) 0.4 MG SL tablet Place 0.4 mg under the tongue every 5 (five) minutes as needed for chest pain (x 3 tablets daily). Reported on 11/30/2015     No current facility-administered medications for this encounter.    No Known Allergies    Social History   Social History  . Marital Status: Married    Spouse Name: N/A  . Number of Children: 2  . Years of Education: N/A   Occupational History  . retired    Social History Main Topics  . Smoking status: Former Smoker -- 0.30 packs/day for .5 years    Types: Cigarettes    Quit date: 08/19/1986  . Smokeless tobacco: Never Used     Comment: pt only smoked for only a few months.  . Alcohol Use: No  . Drug Use: No  . Sexual Activity: No   Other Topics Concern  . Not on file   Social History Narrative      Family History  Problem Relation Age of Onset  . Heart attack Mother   . Coronary artery disease Father   . Stroke Father   . Heart attack Brother   . Heart failure Brother   . COPD Brother   . Emphysema Sister   . Emphysema Brother     Filed Vitals:   11/30/15 1055 11/30/15 1058 11/30/15 1059  BP:  162/74 132/66  Pulse: 61    Weight: 224 lb 12 oz (101.946 kg)    SpO2: 93%      PHYSICAL EXAM: General:  Well appearing. No respiratory difficulty HEENT: normal Neck: supple. JVD difficult to assess with pt girth, does not appear elevated. Carotids 2+ bilat; no bruits. No thyromegaly or nodule noted.  Cor: PMI nondisplaced. RRR. No M/G/R Lungs: CTAB, normal effort Abdomen: Obese, soft, NT, ND, no HSM. No bruits or masses. +BS  Extremities: no cyanosis, clubbing, rash, Trace edema.  Compression hose in place Neuro: alert & oriented x 3, cranial nerves grossly intact. moves all 4 extremities w/o difficulty. Affect  pleasant.   ASSESSMENT & PLAN:  1. Chronic diastolic CHF: Echo 0000000 EF 55-60%, PA peak pressure 67 mm Hg - Continue torsemide 40 mg daily as this is working for her, had initially intended for her to be on 40  mg BID, but seems stable.  - Recheck BMET today. 2. A fib, Chronic - Rate controlled - Stable on warfarin 3. PAH - No cath since 2013, recently started on Adcirca and Letairis (which likely triggered the CHF exacerbation). She had a negative sleep study in the past and has had basically unremarkable chest CTs (other than nodules). Some mention of ?chronic PE but this was never documented. There has been some concern that [place] could be rheumatologic-related, she has history of psoriasis. - VQ scan 11/20/15: Low probability for pulmonary embolus (less than 20%). Patchy areas of decreased ventilation with matched perfusion defects. - Refer to pulmonary rehab.  - Will schedule for RHC next week. 4. CAD - Stable. Continue atorvastatin 40 mg daily. No ASA on warfarin. 5. HTN - Recheck SBP in 130s. Continue current meds for now.   RHC next week. Refer to pulmonary rehab. Recheck BMET today.  Legrand Como "Jonni Sanger" [place], PA-C 11/30/2015    Patient seen and examined with Oda Kilts, PA-C. We discussed all aspects of the encounter. I agree with the assessment and plan as stated above.   Doing well. Volume status much improved. Continue torsemide 40 daily. Plan RHC next week to further assess PAH. Refer to pulmonary rehab.   Bensimhon, Daniel,MD 5:25 PM

## 2015-12-07 NOTE — Progress Notes (Signed)
Site area: Environmental health practitioner Prior to Removal:  Level  0 Pressure Applied For:  10 minutes Manual:   yes Patient Status During Pull:  stable Post Pull Site:  Level  0 Post Pull Instructions Given:  yes Post Pull Pulses Present: yes Dressing Applied:  Small tegaderm Bedrest begins @  1300 Comments:  IV saline locked

## 2015-12-07 NOTE — Discharge Instructions (Signed)
Angiogram, Care After °Refer to this sheet in the next few weeks. These instructions provide you with information about caring for yourself after your procedure. Your health care provider may also give you more specific instructions. Your treatment has been planned according to current medical practices, but problems sometimes occur. Call your health care provider if you have any problems or questions after your procedure. °WHAT TO EXPECT AFTER THE PROCEDURE °After your procedure, it is typical to have the following: °· Bruising at the catheter insertion site that usually fades within 1-2 weeks. °· Blood collecting in the tissue (hematoma) that may be painful to the touch. It should usually decrease in size and tenderness within 1-2 weeks. °HOME CARE INSTRUCTIONS °· Take medicines only as directed by your health care provider. °· You may shower 24-48 hours after the procedure or as directed by your health care provider. Remove the bandage (dressing) and gently wash the site with plain soap and water. Pat the area dry with a clean towel. Do not rub the site, because this may cause bleeding. °· Do not take baths, swim, or use a hot tub until your health care provider approves. °· Check your insertion site every day for redness, swelling, or drainage. °· Do not apply powder or lotion to the site. °· Do not lift over 10 lb (4.5 kg) for 5 days after your procedure or as directed by your health care provider. °· Ask your health care provider when it is okay to: °¨ Return to work or school. °¨ Resume usual physical activities or sports. °¨ Resume sexual activity. °· Do not drive home if you are discharged the same day as the procedure. Have someone else drive you. °· You may drive 24 hours after the procedure unless otherwise instructed by your health care provider. °· Do not operate machinery or power tools for 24 hours after the procedure or as directed by your health care provider. °· If your procedure was done as an  outpatient procedure, which means that you went home the same day as your procedure, a responsible adult should be with you for the first 24 hours after you arrive home. °· Keep all follow-up visits as directed by your health care provider. This is important. °SEEK MEDICAL CARE IF: °· You have a fever. °· You have chills. °· You have increased bleeding from the catheter insertion site. Hold pressure on the site. °SEEK IMMEDIATE MEDICAL CARE IF: °· You have unusual pain at the catheter insertion site. °· You have redness, warmth, or swelling at the catheter insertion site. °· You have drainage (other than a small amount of blood on the dressing) from the catheter insertion site. °· The catheter insertion site is bleeding, and the bleeding does not stop after 30 minutes of holding steady pressure on the site. °· The area near or just beyond the catheter insertion site becomes pale, cool, tingly, or numb. °  °This information is not intended to replace advice given to you by your health care provider. Make sure you discuss any questions you have with your health care provider. °  °Document Released: 02/21/2005 Document Revised: 08/26/2014 Document Reviewed: 01/06/2013 °Elsevier Interactive Patient Education ©2016 Elsevier Inc. ° °

## 2015-12-07 NOTE — Interval H&P Note (Signed)
History and Physical Interval Note:  12/07/2015 10:54 AM  [name]  has presented today for surgery, with the diagnosis of pulmonary hypertension  The various methods of treatment have been discussed with the patient and family. After consideration of risks, benefits and other options for treatment, the patient has consented to  Procedure(s): Right Heart Cath (N/A) as a surgical intervention .  The patient's history has been reviewed, patient examined, no change in status, stable for surgery.  I have reviewed the patient's chart and labs.  Questions were answered to the patient's satisfaction.     [name], Quillian Quince

## 2015-12-20 ENCOUNTER — Ambulatory Visit (INDEPENDENT_AMBULATORY_CARE_PROVIDER_SITE_OTHER): Payer: Medicare Other | Admitting: *Deleted

## 2015-12-20 DIAGNOSIS — I4891 Unspecified atrial fibrillation: Secondary | ICD-10-CM | POA: Diagnosis not present

## 2015-12-20 DIAGNOSIS — Z5181 Encounter for therapeutic drug level monitoring: Secondary | ICD-10-CM | POA: Diagnosis not present

## 2015-12-20 LAB — POCT INR: INR: 1.7

## 2015-12-25 DIAGNOSIS — I272 Other secondary pulmonary hypertension: Secondary | ICD-10-CM | POA: Diagnosis not present

## 2015-12-25 DIAGNOSIS — I10 Essential (primary) hypertension: Secondary | ICD-10-CM | POA: Diagnosis not present

## 2015-12-25 DIAGNOSIS — E785 Hyperlipidemia, unspecified: Secondary | ICD-10-CM | POA: Diagnosis not present

## 2015-12-25 DIAGNOSIS — I4891 Unspecified atrial fibrillation: Secondary | ICD-10-CM | POA: Diagnosis not present

## 2015-12-25 DIAGNOSIS — I2510 Atherosclerotic heart disease of native coronary artery without angina pectoris: Secondary | ICD-10-CM | POA: Diagnosis not present

## 2015-12-25 DIAGNOSIS — I5023 Acute on chronic systolic (congestive) heart failure: Secondary | ICD-10-CM | POA: Diagnosis not present

## 2015-12-28 ENCOUNTER — Encounter (HOSPITAL_COMMUNITY): Payer: Self-pay | Admitting: Internal Medicine

## 2015-12-28 ENCOUNTER — Ambulatory Visit (HOSPITAL_COMMUNITY)
Admission: RE | Admit: 2015-12-28 | Discharge: 2015-12-28 | Disposition: A | Discharge status: Home / Self Care | Payer: Medicare Other | Source: Ambulatory Visit | Attending: Internal Medicine | Admitting: Internal Medicine

## 2015-12-28 VITALS — BP 124/50 | HR 72 | Wt 222.8 lb

## 2015-12-28 DIAGNOSIS — Z87891 Personal history of nicotine dependence: Secondary | ICD-10-CM | POA: Insufficient documentation

## 2015-12-28 DIAGNOSIS — Z8249 Family history of ischemic heart disease and other diseases of the circulatory system: Secondary | ICD-10-CM | POA: Insufficient documentation

## 2015-12-28 DIAGNOSIS — I272 Other secondary pulmonary hypertension: Secondary | ICD-10-CM

## 2015-12-28 DIAGNOSIS — Z825 Family history of asthma and other chronic lower respiratory diseases: Secondary | ICD-10-CM | POA: Diagnosis not present

## 2015-12-28 DIAGNOSIS — I5033 Acute on chronic diastolic (congestive) heart failure: Secondary | ICD-10-CM | POA: Insufficient documentation

## 2015-12-28 DIAGNOSIS — I482 Chronic atrial fibrillation, unspecified: Secondary | ICD-10-CM

## 2015-12-28 DIAGNOSIS — I110 Hypertensive heart disease with heart failure: Secondary | ICD-10-CM | POA: Insufficient documentation

## 2015-12-28 DIAGNOSIS — Z9981 Dependence on supplemental oxygen: Secondary | ICD-10-CM | POA: Diagnosis not present

## 2015-12-28 DIAGNOSIS — Z79899 Other long term (current) drug therapy: Secondary | ICD-10-CM | POA: Diagnosis not present

## 2015-12-28 DIAGNOSIS — Z7901 Long term (current) use of anticoagulants: Secondary | ICD-10-CM | POA: Insufficient documentation

## 2015-12-28 DIAGNOSIS — L409 Psoriasis, unspecified: Secondary | ICD-10-CM | POA: Diagnosis not present

## 2015-12-28 DIAGNOSIS — I5032 Chronic diastolic (congestive) heart failure: Secondary | ICD-10-CM | POA: Diagnosis not present

## 2015-12-28 DIAGNOSIS — I2510 Atherosclerotic heart disease of native coronary artery without angina pectoris: Secondary | ICD-10-CM | POA: Diagnosis not present

## 2015-12-28 DIAGNOSIS — I252 Old myocardial infarction: Secondary | ICD-10-CM | POA: Diagnosis not present

## 2015-12-28 DIAGNOSIS — Z823 Family history of stroke: Secondary | ICD-10-CM | POA: Diagnosis not present

## 2015-12-28 DIAGNOSIS — K219 Gastro-esophageal reflux disease without esophagitis: Secondary | ICD-10-CM | POA: Diagnosis not present

## 2015-12-28 DIAGNOSIS — E785 Hyperlipidemia, unspecified: Secondary | ICD-10-CM | POA: Diagnosis not present

## 2015-12-28 MED ORDER — APIXABAN 5 MG PO TABS: 5.0000 mg | ORAL_TABLET | Freq: Two times a day (BID) | ORAL | Status: DC

## 2015-12-28 MED ORDER — POTASSIUM CHLORIDE CRYS ER 20 MEQ PO TBCR: 20.0000 meq | EXTENDED_RELEASE_TABLET | Freq: Every day | ORAL | Status: DC

## 2015-12-28 MED ORDER — SPIRONOLACTONE 25 MG PO TABS: 12.5000 mg | ORAL_TABLET | Freq: Every day | ORAL | Status: DC

## 2015-12-28 NOTE — Patient Instructions (Signed)
Take Spironolactone 12.5 (1/2 tablet) mg daily.  Stop warfarin.  Start Eliquis 5mg  twice daily 2 days after last dose of coumadin. (Start on Saturday May 13th 2017)  Decrease Potassium to 20mg  daily.  Lab work: 1 week (bmet)  You have been referred to Pulmonary rehab at [facility]   Follow up with Dr.Bensimhon in 2-3 months.

## 2015-12-28 NOTE — Progress Notes (Signed)
Patient ID: [name], female   DOB: January 18, 1935, 81 y.o.   MRN: [id number]    Advanced Heart Failure Clinic Note   Primary Physician: Dr Gerarda Fraction  Primary Cardiologist: Dr Domenic Polite  Pulmonology: Dr Lamonte Sakai.   HPI:  [name] is a 81 year old with history of pulmonary HTN on letaris and tadalafil, chronic A fib on coumadin, HTN, CAD BMS LAD 2006, obesity, psoriasis, bradycardia, and severe TR  In October 2016 she was evaluated by cardiology for bradycardia. At that point PPM was not pursued.   She was evaluated by Dr Lamonte Sakai February 20th and started on Congo. She began to have leg edema after this.   Was admitted from 3/31 - 11/22/2015 with volume overload. She diuresed 9 L and down 10 lbs from admit. Lasix switched to torsemide 40 mg daily for home.   Discharge weight 226 lbs.  She presents today for regular follow up.  RHC since last visit with moderate PAH and normal wedge pressure + prominent v waves.  Medical therapy continued with role for selective pulmonary vasodilators at this point. Down 2 lbs from last visit. Breathing continues to improve.  States she hasn't heard back pulmonary rehab. Peeing great on torsemide, urine very clear. She does have occasional palpitations with exertion.  No CP or PND. Sleeps with slight incline with bad GERD. Uses 02 at night. No bleeding on coumadin.  Occasional bruising. Walks 10-12 mins three times per day in house.   Echo 11/18/15 EF 55-60%, PA peak pressure 67 mm Hg  VQ scan 11/20/15: Low probability for pulmonary embolus (less than 20%). Patchy areas of decreased ventilation with matched perfusion defects.  RHC 12/07/15 RA = 10 RV = 58/2/9  PA = 59/17 (36) PCW = 16 (v wave to 30-35) Fick cardiac output/index = 5.2/2.6 PVR = 3.9 WU Ao sat = 97% PA sat = 65%, 66%  Past Medical History  Diagnosis Date  . Essential hypertension   . Hyperlipidemia   . Chronic atrial fibrillation ([place])     Since 2011  . CAD (coronary  artery disease)     BMS to LAD 07/2005  . Morbid obesity ([place])   . Psoriasis   . Right ventricular dysfunction   . Tricuspid regurgitation     Severe  . Pulmonary hypertension (HCC)     Mixed, PASP 67 mmHg at [facility] 2013  . GERD (gastroesophageal reflux disease)   . A-fib ([place])   . Myocardial infarction ([place]) 07/2005  . Degenerative arthritis of right knee   . Arthritis     "knees" (11/17/2015)  . On home oxygen therapy     "2.5L at night" (11/17/2015)    Current Outpatient Prescriptions  Medication Sig Dispense Refill  . ALPRAZolam (XANAX) 0.5 MG tablet Take 0.5 mg by mouth at bedtime.     [name] alum hydroxide-mag trisilicate (GAVISCON) [id number] MG CHEW chewable tablet Chew 1 tablet by mouth 3 (three) times daily as needed for indigestion or heartburn.    [name] ambrisentan (LETAIRIS) 5 MG tablet Take 1 tablet (5 mg total) by mouth daily. 20 tablet 0  . aspirin 81 MG tablet Take 81 mg by mouth daily.      [name] atorvastatin (LIPITOR) 80 MG tablet TAKE 1/2 TABLET BY MOUTH DAILY 45 tablet 2  . AZOPT 1 % ophthalmic suspension Place 1 drop into both eyes 3 (three) times daily.     . brimonidine (ALPHAGAN) 0.2 % ophthalmic solution Place 1 drop into both eyes  2 (two) times daily.     . Coenzyme Q10 (COQ10) 200 MG CAPS Take 200 mg by mouth daily.    . colchicine 0.6 MG tablet Take 0.6 mg by mouth daily.    . fluticasone (FLONASE) 50 MCG/ACT nasal spray Place 2 sprays into both nostrils daily.     [name] HYDROcodone-acetaminophen (NORCO/VICODIN) 5-325 MG tablet Take 1 tablet by mouth 2 (two) times daily as needed for moderate pain.     [name] latanoprost (XALATAN) 0.005 % ophthalmic solution Place 1 drop into the right eye at bedtime.     . lidocaine (XYLOCAINE) 5 % ointment Apply 1 application topically daily as needed (knee pain).     [name] losartan (COZAAR) 100 MG tablet Take 100 mg by mouth every evening.    [name] omeprazole (PRILOSEC) 40 MG capsule Take 40 mg by mouth daily.    . potassium chloride SA (K-DUR,KLOR-CON)  20 MEQ tablet Take 1 tablet (20 mEq total) by mouth 2 (two) times daily. 60 tablet 0  . Probiotic Product (PROBIOTIC PO) Take 1 tablet by mouth 2 (two) times daily.    . Tadalafil, PAH, (ADCIRCA) 20 MG TABS Take 2 tablets (40 mg total) by mouth daily. 60 tablet 3  . timolol (TIMOPTIC) 0.5 % ophthalmic solution Place 1 drop into both eyes 2 (two) times daily.    [name] torsemide (DEMADEX) 20 MG tablet Take 2 tablets (40 mg total) by mouth daily. 180 tablet 0  . warfarin (COUMADIN) 5 MG tablet Take 5 mg by mouth daily at 6 PM.     . nitroGLYCERIN (NITROSTAT) 0.4 MG SL tablet Place 0.4 mg under the tongue every 5 (five) minutes as needed for chest pain (x 3 tablets daily). Reported on 12/28/2015     No current facility-administered medications for this encounter.    No Known Allergies    Social History   Social History  . Marital Status: Married    Spouse Name: N/A  . Number of Children: 2  . Years of Education: N/A   Occupational History  . retired    Social History Main Topics  . Smoking status: Former Smoker -- 0.30 packs/day for .5 years    Types: Cigarettes    Quit date: 08/19/1986  . Smokeless tobacco: Never Used     Comment: pt only smoked for only a few months.  . Alcohol Use: No  . Drug Use: No  . Sexual Activity: No   Other Topics Concern  . Not on file   Social History Narrative      Family History  Problem Relation Age of Onset  . Heart attack Mother   . Coronary artery disease Father   . Stroke Father   . Heart attack Brother   . Heart failure Brother   . COPD Brother   . Emphysema Sister   . Emphysema Brother     Filed Vitals:   12/28/15 1412  BP: 124/50  Pulse: 72  Weight: 222 lb 12 oz (101.039 kg)  SpO2: 97%   Wt Readings from Last 3 Encounters:  12/28/15 222 lb 12 oz (101.039 kg)  12/07/15 220 lb (99.791 kg)  12/06/15 225 lb (102.059 kg)     PHYSICAL EXAM: General:  Well appearing. No respiratory difficulty HEENT: normal Neck: supple. JVD  up with prominent CV wave. Carotids 2+ bilat; no bruits. No thyromegaly or lymphadenopathy noted.  Cor: PMI nondisplaced. Irregularly irregular. 2/6 TR Lungs: CTAB, normal effort Abdomen: Obese, soft, NT, ND, no HSM. No  bruits or masses. +BS  Extremities: no cyanosis, clubbing, rash, Trace-1+edema.  Compression hose in place Neuro: alert & oriented x 3, cranial nerves grossly intact. moves all 4 extremities w/o difficulty. Affect pleasant.   ASSESSMENT & PLAN:  1. Chronic diastolic CHF: Echo 0000000 EF 55-60%, PA peak pressure 67 mm Hg - Volume status mildly elevated.  Continue torsemide 40 mg daily Check BMET today. -  Add spironolactone 12.5mg  daily. Cut potassium to 70meq daily. BMET 1 week.  2. A fib, Chronic - Rate controlled - Stable on warfarin 3. PAH - Recent RHC was stable. - Continue Adcirca 40 mg daily and Letairis 5 mg daily - She had a negative sleep study in the past and has had basically unremarkable chest CTs (other than nodules). - Some mention of ?chronic PE but this was never documented. There has been some concern that [place] could be rheumatologic-related, she has history of psoriasis. - VQ scan 11/20/15: Low probability for pulmonary embolus (less than 20%). Patchy areas of decreased ventilation with matched perfusion defects. - Refer back to pulmonary rehab.  4. CAD - Stable. Continue atorvastatin 40 mg daily. No ASA on warfarin. 5. HTN - Stable current meds for now.   Legrand Como "Jonni Sanger" [place], PA-C 12/28/2015   Patient seen and examined with Oda Kilts, PA-C. We discussed all aspects of the encounter. I agree with the assessment and plan as stated above.   Mildly overloaded. Add spiro. Continue Adcirca and Letairis. Refer to Pulmonary rehab. Will switch coumadin to Eliquis 5 bid.   Tatyana Biber,MD 2:49 PM

## 2015-12-28 NOTE — Addendum Note (Signed)
Encounter addended by: Harvie Junior, CMA on: 12/28/2015  3:09 PM<BR>     Documentation filed: Patient Instructions Section, Medications, Dx Association, Orders

## 2016-01-01 ENCOUNTER — Telehealth (HOSPITAL_COMMUNITY): Payer: Self-pay | Admitting: Pharmacist

## 2016-01-01 NOTE — Telephone Encounter (Signed)
Eliquis 5 mg PA approved by [facility] from 12/28/2015 until further notice.   [name]. Velva Harman, PharmD, BCPS, CPP Clinical Pharmacist Pager: [phone] Phone: [phone] 01/01/2016 9:41 AM

## 2016-01-03 ENCOUNTER — Ambulatory Visit (INDEPENDENT_AMBULATORY_CARE_PROVIDER_SITE_OTHER): Payer: Medicare Other | Admitting: *Deleted

## 2016-01-03 DIAGNOSIS — I5022 Chronic systolic (congestive) heart failure: Secondary | ICD-10-CM | POA: Diagnosis not present

## 2016-01-03 DIAGNOSIS — Z5181 Encounter for therapeutic drug level monitoring: Secondary | ICD-10-CM

## 2016-01-03 DIAGNOSIS — I4891 Unspecified atrial fibrillation: Secondary | ICD-10-CM

## 2016-01-08 ENCOUNTER — Other Ambulatory Visit (HOSPITAL_COMMUNITY): Payer: Self-pay | Admitting: Internal Medicine

## 2016-01-10 ENCOUNTER — Telehealth (HOSPITAL_COMMUNITY): Payer: Self-pay | Admitting: Surgery

## 2016-01-10 MED ORDER — APIXABAN 5 MG PO TABS: 5.0000 mg | ORAL_TABLET | Freq: Two times a day (BID) | ORAL | Status: DC

## 2016-01-10 NOTE — Telephone Encounter (Signed)
[name] called in reference to her Eliquis.  She tells me she called and they did not have any for her in pharmacy.  I will send prescription to [facility] for medication.

## 2016-01-15 DIAGNOSIS — I5023 Acute on chronic systolic (congestive) heart failure: Secondary | ICD-10-CM | POA: Diagnosis not present

## 2016-01-15 DIAGNOSIS — I2510 Atherosclerotic heart disease of native coronary artery without angina pectoris: Secondary | ICD-10-CM | POA: Diagnosis not present

## 2016-01-15 DIAGNOSIS — I4891 Unspecified atrial fibrillation: Secondary | ICD-10-CM | POA: Diagnosis not present

## 2016-01-15 DIAGNOSIS — E785 Hyperlipidemia, unspecified: Secondary | ICD-10-CM | POA: Diagnosis not present

## 2016-01-15 DIAGNOSIS — I272 Other secondary pulmonary hypertension: Secondary | ICD-10-CM | POA: Diagnosis not present

## 2016-01-15 DIAGNOSIS — I10 Essential (primary) hypertension: Secondary | ICD-10-CM | POA: Diagnosis not present

## 2016-01-25 DIAGNOSIS — Z6838 Body mass index (BMI) 38.0-38.9, adult: Secondary | ICD-10-CM | POA: Diagnosis not present

## 2016-01-25 DIAGNOSIS — I5032 Chronic diastolic (congestive) heart failure: Secondary | ICD-10-CM | POA: Diagnosis not present

## 2016-01-25 DIAGNOSIS — H0014 Chalazion left upper eyelid: Secondary | ICD-10-CM | POA: Diagnosis not present

## 2016-01-25 DIAGNOSIS — Z1389 Encounter for screening for other disorder: Secondary | ICD-10-CM | POA: Diagnosis not present

## 2016-01-31 ENCOUNTER — Encounter (HOSPITAL_COMMUNITY): Payer: Medicare Other

## 2016-01-31 ENCOUNTER — Ambulatory Visit (INDEPENDENT_AMBULATORY_CARE_PROVIDER_SITE_OTHER): Payer: Medicare Other | Admitting: *Deleted

## 2016-01-31 DIAGNOSIS — Z5181 Encounter for therapeutic drug level monitoring: Secondary | ICD-10-CM | POA: Diagnosis not present

## 2016-01-31 DIAGNOSIS — I4891 Unspecified atrial fibrillation: Secondary | ICD-10-CM | POA: Diagnosis not present

## 2016-01-31 LAB — CBC
HCT: 33.4 % — ABNORMAL LOW (ref 35.0–45.0)
HEMOGLOBIN: 10.9 g/dL — AB (ref 11.7–15.5)
MCH: 30.7 pg (ref 27.0–33.0)
MCHC: 32.6 g/dL (ref 32.0–36.0)
MCV: 94.1 fL (ref 80.0–100.0)
MPV: 10.7 fL (ref 7.5–12.5)
Platelets: 126 10*3/uL — ABNORMAL LOW (ref 140–400)
RBC: 3.55 MIL/uL — AB (ref 3.80–5.10)
RDW: 14.4 % (ref 11.0–15.0)
WBC: 3.8 10*3/uL (ref 3.8–10.8)

## 2016-01-31 NOTE — Progress Notes (Signed)
Pt was started on Eliquis 5mg  bid on 12/30/15 by Dr Haroldine Laws for Atrial Fibrillation.    Pt has been on Eliquis x 1 month.  She denies problems since starting it.  She has not had any excessive bruising, bleeding or GI upset.  Reviewed patients medication list.  Pt is not currently on any combined P-gp and strong CYP3A4 inhibitors/inducers (ketoconazole, traconazole, ritonavir, carbamazepine, phenytoin, rifampin, St. John's wort).  Reviewed labs from 01/31/16 @ [facility].  SCr 1.1, Weight 226, CrCl 68.21.  Dose is appropriate based on age, weight and Serum Creatine.   Hgb and HCT: 10.9/33.4  Hgb has dropped from 12 range but was in 10 range prior to that.  Copy to PCP.  A full discussion of the nature of anticoagulants has been carried out.  A benefit/risk analysis has been presented to the patient, so that they understand the justification for choosing anticoagulation with Eliquis at this time.  The need for compliance is stressed.  Pt is aware to take the medication twice daily.  Side effects of potential bleeding are discussed, including unusual colored urine or stools, coughing up blood or coffee ground emesis, nose bleeds or serious fall or head trauma.  Discussed signs and symptoms of stroke. The patient should avoid any OTC items containing aspirin or ibuprofen.  Avoid alcohol consumption.   Call if any signs of abnormal bleeding.  Discussed financial obligations and resolved any difficulty in obtaining medication.   Call pt with lab results/ Next lab test test in 6 months.

## 2016-02-01 ENCOUNTER — Telehealth (HOSPITAL_COMMUNITY): Payer: Self-pay | Admitting: *Deleted

## 2016-02-01 LAB — BASIC METABOLIC PANEL
BUN: 41 mg/dL — AB (ref 7–25)
CHLORIDE: 100 mmol/L (ref 98–110)
CO2: 25 mmol/L (ref 20–31)
CREATININE: 1.1 mg/dL — AB (ref 0.60–0.93)
Calcium: 9.7 mg/dL (ref 8.6–10.4)
GLUCOSE: 86 mg/dL (ref 65–99)
Potassium: 4.3 mmol/L (ref 3.5–5.3)
Sodium: 141 mmol/L (ref 135–146)

## 2016-02-01 NOTE — Telephone Encounter (Signed)
Pt called and wanted to know if she should continue her ASA 81 mg since she is on Eliquis now or if she should stop.  Discussed w/Erika, pharm D she recommends w/stable CAD and no recent stents should stop ASA.  Pt aware and agreeable

## 2016-02-05 ENCOUNTER — Other Ambulatory Visit: Payer: Self-pay | Admitting: *Deleted

## 2016-02-05 MED ORDER — TADALAFIL (PAH) 20 MG PO TABS: 40.0000 mg | ORAL_TABLET | Freq: Every day | ORAL | Status: DC

## 2016-02-06 ENCOUNTER — Encounter (HOSPITAL_COMMUNITY): Payer: Self-pay

## 2016-02-06 ENCOUNTER — Encounter (HOSPITAL_COMMUNITY)
Admission: RE | Admit: 2016-02-06 | Discharge: 2016-02-06 | Disposition: A | Discharge status: Home / Self Care | Payer: Medicare Other | Source: Ambulatory Visit | Attending: Internal Medicine | Admitting: Internal Medicine

## 2016-02-06 DIAGNOSIS — I5032 Chronic diastolic (congestive) heart failure: Secondary | ICD-10-CM | POA: Diagnosis not present

## 2016-02-06 NOTE — Progress Notes (Signed)
Cardiac/Pulmonary Rehab Medication Review by a Pharmacist  Does the patient  feel that his/her medications are working for him/her?  yes  Has the patient been experiencing any side effects to the medications prescribed?  yes  Does the patient measure his/her own blood pressure or blood glucose at home?  no   Does the patient have any problems obtaining medications due to transportation or finances?   no  Understanding of regimen: excellent Understanding of indications: excellent Potential of compliance: excellent  Pharmacist comments: patient is compliant with medications and understands how each one works. She was recently admitted to hospital for CHF related to new PAH  medication added ambrisentan and tadalafil.. She plans to follow up with pulmonologist and discuss plans for ambrisentan and tadalafil.  She weighs herself daily. Reviewed eliquis and potential bleeding risks.  Thanks for the opportunity for participating in the care of this patient,  Isac Sarna, BS [place], [place] Clinical Pharmacist Pager [phone] 02/06/2016 3:24 PM

## 2016-02-06 NOTE — Progress Notes (Signed)
Pulmonary Individual Treatment Plan  Patient Details  Name: [name] MRN: [id number] Date of Birth: [birth year] Referring Provider:        PULMONARY REHAB OTHER RESP ORIENTATION from 02/06/2016 in [facility]   Referring Provider  Dr. Haroldine Laws      Initial Encounter Date:       [facility] from 02/06/2016 in [facility]   Date  02/06/16   Referring Provider  Dr. Haroldine Laws      Visit Diagnosis: Diastolic CHF, chronic ([place])  Patient's Home Medications on Admission:   Current outpatient prescriptions:  .  doxycycline (DORYX) 100 MG EC tablet, Take 100 mg by mouth 2 (two) times daily., Disp: , Rfl:  .  pantoprazole (PROTONIX) 20 MG tablet, Take 20 mg by mouth daily., Disp: , Rfl:  .  ALPRAZolam (XANAX) 0.5 MG tablet, Take 0.5 mg by mouth at bedtime. , Disp: , Rfl:  .  alum hydroxide-mag trisilicate (GAVISCON) [id number] MG CHEW chewable tablet, Chew 1 tablet by mouth 3 (three) times daily as needed for indigestion or heartburn., Disp: , Rfl:  .  ambrisentan (LETAIRIS) 5 MG tablet, Take 1 tablet (5 mg total) by mouth daily., Disp: 20 tablet, Rfl: 0 .  apixaban (ELIQUIS) 5 MG TABS tablet, Take 1 tablet (5 mg total) by mouth 2 (two) times daily., Disp: 60 tablet, Rfl: 3 .  atorvastatin (LIPITOR) 80 MG tablet, TAKE 1/2 TABLET BY MOUTH DAILY, Disp: 45 tablet, Rfl: 2 .  AZOPT 1 % ophthalmic suspension, Place 1 drop into both eyes 3 (three) times daily. , Disp: , Rfl:  .  brimonidine (ALPHAGAN) 0.2 % ophthalmic solution, Place 1 drop into both eyes 2 (two) times daily. , Disp: , Rfl:  .  Coenzyme Q10 (COQ10) 200 MG CAPS, Take 200 mg by mouth daily., Disp: , Rfl:  .  colchicine 0.6 MG tablet, Take 0.6 mg by mouth daily., Disp: , Rfl:  .  fluticasone (FLONASE) 50 MCG/ACT nasal spray, Place 2 sprays into both nostrils daily. , Disp: , Rfl:  .  HYDROcodone-acetaminophen (NORCO/VICODIN) 5-325 MG tablet, Take 1 tablet by  mouth 2 (two) times daily as needed for moderate pain. , Disp: , Rfl:  .  latanoprost (XALATAN) 0.005 % ophthalmic solution, Place 1 drop into the right eye at bedtime. , Disp: , Rfl:  .  lidocaine (XYLOCAINE) 5 % ointment, Apply 1 application topically daily as needed (knee pain). , Disp: , Rfl:  .  losartan (COZAAR) 100 MG tablet, Take 100 mg by mouth every evening., Disp: , Rfl:  .  nitroGLYCERIN (NITROSTAT) 0.4 MG SL tablet, Place 0.4 mg under the tongue every 5 (five) minutes as needed for chest pain (x 3 tablets daily). Reported on 12/28/2015, Disp: , Rfl:  .  potassium chloride SA (K-DUR,KLOR-CON) 20 MEQ tablet, Take 1 tablet (20 mEq total) by mouth daily., Disp: 30 tablet, Rfl: 3 .  Probiotic Product (PROBIOTIC PO), Take 1 tablet by mouth 2 (two) times daily., Disp: , Rfl:  .  spironolactone (ALDACTONE) 25 MG tablet, Take 0.5 tablets (12.5 mg total) by mouth daily., Disp: 15 tablet, Rfl: 3 .  tadalafil, PAH, (ADCIRCA) 20 MG tablet, Take 2 tablets (40 mg total) by mouth daily., Disp: 60 tablet, Rfl: 5 .  timolol (TIMOPTIC) 0.5 % ophthalmic solution, Place 1 drop into both eyes 2 (two) times daily., Disp: , Rfl:  .  torsemide (DEMADEX) 20 MG tablet, Take 2 tablets (40 mg total) by  mouth daily., Disp: 180 tablet, Rfl: 0  Past Medical History: Past Medical History  Diagnosis Date  . Essential hypertension   . Hyperlipidemia   . Chronic atrial fibrillation ([place])     Since 2011  . CAD (coronary artery disease)     BMS to LAD 07/2005  . Morbid obesity ([place])   . Psoriasis   . Right ventricular dysfunction   . Tricuspid regurgitation     Severe  . Pulmonary hypertension (H[facility])     Mixed, PASP 67 mmHg at [facility] 2013  . GERD (gastroesophageal reflux disease)   . A-fib ([place])   . Myocardial infarction ([place]) 07/2005  . Degenerative arthritis of right knee   . Arthritis     "knees" (11/17/2015)  . On home oxygen therapy     "2.5L at night" (11/17/2015)    Tobacco Use: History  Smoking  status  . Former Smoker -- 0.30 packs/day for .5 years  . Types: Cigarettes  . Quit date: 08/19/1986  Smokeless tobacco  . Never Used    Comment: pt only smoked for only a few months.    Labs:     Recent Review Flowsheet Data    Labs for ITP Cardiac and Pulmonary Rehab Latest Ref Rng 08/04/2012 08/04/2012 08/04/2012 12/07/2015 12/07/2015   PHART 7.350 - 7.450 7.379 - - - -   PCO2ART 35.0 - 45.0 mmHg 39.6 - - - -   HCO3 20.0 - 24.0 mEq/L 23.4 24.9(H) 24.2(H) 29.5(H) 28.7(H)   TCO2 0 - 100 mmol/L 25 26 25 31 30    ACIDBASEDEF 0.0 - 2.0 mmol/L 2.0 - 1.0 - -   O2SAT - 94.0 61.0 61.0 66.0 65.0      Capillary Blood Glucose: Lab Results  Component Value Date   GLUCAP 97 04/09/2014     ADL UCSD:     Pulmonary Assessment Scores      02/06/16 1712       ADL UCSD   ADL Phase Entry     SOB Score total 50     Rest 0     Walk 6     Stairs 3     Bath 2     Dress 2     Shop 3     CAT Score   CAT Score 16     mMRC Score   mMRC Score 2        Pulmonary Function Assessment:     Pulmonary Function Assessment - 02/06/16 1709    Pulmonary Function Tests   FVC% 46 %   FEV1% 47 %   FEV1/FVC Ratio 74   RV% 98 %   DLCO% 27 %   Initial Spirometry Results   FVC% 1.31 %   FEV1% 0.93 %   FEV1/FVC Ratio 71   Post Bronchodilator Spirometry Results   FVC% 1.26 %   FEV1% 0.94 %   FEV1/FVC Ratio 74   Breath   Shortness of Breath Yes      Exercise Target Goals: Date: 02/06/16  Exercise Program Goal: Individual exercise prescription set with THRR, safety & activity barriers. Participant demonstrates ability to understand and report RPE using BORG scale, to self-measure pulse accurately, and to acknowledge the importance of the exercise prescription.  Exercise Prescription Goal: Starting with aerobic activity 30 plus minutes a day, 3 days per week for initial exercise prescription. Provide home exercise prescription and guidelines that participant acknowledges understanding  prior to discharge.  Activity Barriers & Risk Stratification:  Activity Barriers & Cardiac Risk Stratification - 02/06/16 1651    Activity Barriers & Cardiac Risk Stratification   Activity Barriers Back Problems;Deconditioning  Left knee problems   Cardiac Risk Stratification High      6 Minute Walk:     6 Minute Walk      02/07/16 1400       6 Minute Walk   Phase Initial     Distance 735 feet     Walk Time 6 minutes     # of Rest Breaks 0     MPH 1.39     METS 2.06     RPE 13     Perceived Dyspnea  13     VO2 Peak 3.38     Symptoms No     Resting HR 62 bpm     Resting BP 110/60 mmHg     Max Ex. HR 103 bpm     Max Ex. BP 144/62 mmHg     2 Minute Post BP 122/60 mmHg        Initial Exercise Prescription:     Initial Exercise Prescription - 02/07/16 1400    Date of Initial Exercise RX and Referring Provider   Date 02/06/16   Referring Provider Dr. Haroldine Laws   Treadmill   MPH 0.6   Grade 0   Minutes 15   METs 1.4   NuStep   Level 2   Watts 11   Minutes 15   METs 1.8   Arm Ergometer   Level 1.5   Watts 15   Minutes 15   METs 2.1   Prescription Details   Frequency (times per week) 2   Duration Progress to 30 minutes of continuous aerobic without signs/symptoms of physical distress   Intensity   THRR REST +  20   THRR 40-80% of Max Heartrate [phone]   Ratings of Perceived Exertion 11-13   Perceived Dyspnea 0-4   Progression   Progression Continue to progress workloads to maintain intensity without signs/symptoms of physical distress.   Resistance Training   Training Prescription Yes   Weight 1   Reps 10-12      Perform Capillary Blood Glucose checks as needed.  Exercise Prescription Changes:   Exercise Comments:   Discharge Exercise Prescription (Final Exercise Prescription Changes):   Nutrition:  Target Goals: Understanding of nutrition guidelines, daily intake of sodium 1500mg , cholesterol 200mg , calories 30% from fat and 7%  or less from saturated fats, daily to have 5 or more servings of fruits and vegetables.  Biometrics:     Pre Biometrics - 02/07/16 1404    Pre Biometrics   Height 5\' 3"  (1.6 m)   Weight 217 lb 9.5 oz (98.7 kg)   Waist Circumference 50 inches   Hip Circumference 47.5 inches   Waist to Hip Ratio 1.05 %   BMI (Calculated) 38.6   Triceps Skinfold 21 mm   % Body Fat 29 %   Grip Strength 40.3 kg   Flexibility 0 in   Single Leg Stand 0 seconds       Nutrition Therapy Plan and Nutrition Goals:   Nutrition Discharge: Rate Your Plate Scores:     Nutrition Assessments - 02/06/16 1715    Rate Your Plate Scores   Pre Score 64      Psychosocial: Target Goals: Acknowledge presence or absence of depression, maximize coping skills, provide positive support system. Participant is able to verbalize types and ability to use techniques and skills needed for  reducing stress and depression.  Initial Review & Psychosocial Screening:     Initial Psych Review & Screening - 02/06/16 1719    Initial Review   Current issues with --  No issues   Family Dynamics   Good Support System? Yes   Barriers   Psychosocial barriers to participate in program There are no identifiable barriers or psychosocial needs.   Screening Interventions   Interventions Encouraged to exercise      Quality of Life Scores:     Quality of Life - 02/07/16 1406    Quality of Life Scores   Health/Function Pre 7.42 %   Socioeconomic Pre 1.07 %   Psych/Spiritual Pre 1.42 %   Family Pre 1.2 %   GLOBAL Pre 3.82 %      PHQ-9:     Recent Review Flowsheet Data    Depression screen [facility] 2/9 02/06/2016   Decreased Interest 0   Down, Depressed, Hopeless 0   PHQ - 2 Score 0   Altered sleeping 0   Tired, decreased energy 0   Change in appetite 0   Feeling bad or failure about yourself  0   Trouble concentrating 0   Moving slowly or fidgety/restless 0   Suicidal thoughts 0   PHQ-9 Score 0   Difficult doing  work/chores Somewhat difficult      Psychosocial Evaluation and Intervention:     Psychosocial Evaluation - 02/06/16 1720    Psychosocial Evaluation & Interventions   Interventions Relaxation education;Encouraged to exercise with the program and follow exercise prescription   Continued Psychosocial Services Needed No      Psychosocial Re-Evaluation:   Education: Education Goals: Education classes will be provided on a weekly basis, covering required topics. Participant will state understanding/return demonstration of topics presented.  Learning Barriers/Preferences:     Learning Barriers/Preferences - 02/06/16 1652    Learning Barriers/Preferences   Learning Barriers None   Learning Preferences Written Material      Education Topics: How Lungs Work and Diseases: - Discuss the anatomy of the lungs and diseases that can affect the lungs, such as COPD.   Exercise: -Discuss the importance of exercise, FITT principles of exercise, normal and abnormal responses to exercise, and how to exercise safely.   Environmental Irritants: -Discuss types of environmental irritants and how to limit exposure to environmental irritants.   Meds/Inhalers and oxygen: - Discuss respiratory medications, definition of an inhaler and oxygen, and the proper way to use an inhaler and oxygen.   Energy Saving Techniques: - Discuss methods to conserve energy and decrease shortness of breath when performing activities of daily living.    Bronchial Hygiene / Breathing Techniques: - Discuss breathing mechanics, pursed-lip breathing technique,  proper posture, effective ways to clear airways, and other functional breathing techniques   Cleaning Equipment: - Provides group verbal and written instruction about the health risks of elevated stress, cause of high stress, and healthy ways to reduce stress.   Nutrition I: Fats: - Discuss the types of cholesterol, what cholesterol does to the body,  and how cholesterol levels can be controlled.   Nutrition II: Labels: -Discuss the different components of food labels and how to read food labels.   Respiratory Infections: - Discuss the signs and symptoms of respiratory infections, ways to prevent respiratory infections, and the importance of seeking medical treatment when having a respiratory infection.   Stress I: Signs and Symptoms: - Discuss the causes of stress, how stress may lead to anxiety and depression,  and ways to limit stress.   Stress II: Relaxation: -Discuss relaxation techniques to limit stress.   Oxygen for Home/Travel: - Discuss how to prepare for travel when on oxygen and proper ways to transport and store oxygen to ensure safety.   Knowledge Questionnaire Score:     Knowledge Questionnaire Score - 02/06/16 1708    Knowledge Questionnaire Score   Pre Score 11/14      Core Components/Risk Factors/Patient Goals at Admission:     Personal Goals and Risk Factors at Admission - 02/06/16 1716    Core Components/Risk Factors/Patient Goals on Admission    Weight Management Weight Loss   Increase Strength and Stamina Yes   Intervention Provide advice, education, support and counseling about physical activity/exercise needs.;Develop an individualized exercise prescription for aerobic and resistive training based on initial evaluation findings, risk stratification, comorbidities and participant's personal goals.   Expected Outcomes Achievement of increased cardiorespiratory fitness and enhanced flexibility, muscular endurance and strength shown through measurements of functional capacity and personal statement of participant.   Improve shortness of breath with ADL's Yes   Intervention Provide education, individualized exercise plan and daily activity instruction to help decrease symptoms of SOB with activities of daily living.   Expected Outcomes Short Term: Achieves a reduction of symptoms when performing  activities of daily living.   Develop more efficient breathing techniques such as purse lipped breathing and diaphragmatic breathing; and practicing self-pacing with activity Yes   Intervention Provide education, demonstration and support about specific breathing techniuqes utilized for more efficient breathing. Include techniques such as pursed lipped breathing, diaphragmatic breathing and self-pacing activity.   Expected Outcomes Short Term: Participant will be able to demonstrate and use breathing techniques as needed throughout daily activities.   Heart Failure Yes   Intervention Provide a combined exercise and nutrition program that is supplemented with education, support and counseling about heart failure. Directed toward relieving symptoms such as shortness of breath, decreased exercise tolerance, and extremity edema.   Expected Outcomes Improve functional capacity of life;Short term: Attendance in program 2-3 days a week with increased exercise capacity. Reported lower sodium intake. Reported increased fruit and vegetable intake. Reports medication compliance.;Short term: Daily weights obtained and reported for increase. Utilizing diuretic protocols set by physician.;Long term: Adoption of self-care skills and reduction of barriers for early signs and symptoms recognition and intervention leading to self-care maintenance.   Personal Goal Other Yes   Personal Goal Breath better, understand her donction better, feel better overall and continue to feel better.    Intervention Attend 2xweek in [place] and supplement with 2 more days at home of exercise.    Expected Outcomes To feel stronger and breath better      Core Components/Risk Factors/Patient Goals Review:    Core Components/Risk Factors/Patient Goals at Discharge (Final Review):    ITP Comments:   Comments: Patient arrived for 1st visit/orientation/education at 2:30 pm. Patient was referred to PR by Dr. Haroldine Laws due to Pulmonary  Hypertension (I27.2) and Chronic Diastolic CHF ([id number]).During orientation advised patient on arrival and appointment times what to wear, what to do before, during and after exercise. Reviewed attendance and class policy. Talked about inclement weather and class consultation policy. Pt is scheduled to return Pulmonary Rehab on 02/13/16 at 10:45 am. Pt was advised to come to class15 minutes before class starts. She was also given instructions on meeting with the dietician and attending the Family Structure classes. Pt is eager to get started. Patient was able to complete 6  minute walk test. Patient was measured for the equipment. Discussed equipment safety with patient. Took patient pre-anthropometric measurements. Patient finished visit at 5:00 pm.

## 2016-02-08 ENCOUNTER — Encounter: Payer: Self-pay | Admitting: Emergency Medicine

## 2016-02-08 ENCOUNTER — Ambulatory Visit (INDEPENDENT_AMBULATORY_CARE_PROVIDER_SITE_OTHER): Payer: Medicare Other | Admitting: Emergency Medicine

## 2016-02-08 VITALS — BP 108/52 | HR 70 | Ht 63.0 in | Wt 217.0 lb

## 2016-02-08 DIAGNOSIS — R918 Other nonspecific abnormal finding of lung field: Secondary | ICD-10-CM

## 2016-02-08 DIAGNOSIS — I2721 Secondary pulmonary arterial hypertension: Secondary | ICD-10-CM

## 2016-02-08 DIAGNOSIS — I2510 Atherosclerotic heart disease of native coronary artery without angina pectoris: Secondary | ICD-10-CM | POA: Diagnosis not present

## 2016-02-08 DIAGNOSIS — J310 Chronic rhinitis: Secondary | ICD-10-CM | POA: Diagnosis not present

## 2016-02-08 DIAGNOSIS — I272 Other secondary pulmonary hypertension: Secondary | ICD-10-CM | POA: Diagnosis not present

## 2016-02-08 NOTE — Patient Instructions (Addendum)
Please continue your Adcirca and letaris as you are taking them  Continue your diuretic medication and Eliquis as managed by Dr Haroldine Laws.  Continue your flonase We do not need to repeat your CT scan of the chest at this time.  We will repeat your lab work in October  Follow with Dr Lamonte Sakai in October 2017 or sooner if you have any problems.

## 2016-02-08 NOTE — Assessment & Plan Note (Signed)
Multifactorial secondary pulmonary hypertension. She has been able to tolerate adcirca and letaris now that she is on a more aggressive diuretic regimen. Hopefully this will be reflected in improvement in her PA pressures and clinical status. Plan to repeat her echocardiogram and 6 minute walk at the end of this year. I believe she needs repeat LFTs in October. If these are stable then we will not check as frequently. Continue anticoagulation for her PAH. Also note that she carries a history of possible thromboembolic disease although I don't believe this is definitively established. She is about to start cardiopulmonary rehabilitation which I wholeheartedly support.

## 2016-02-08 NOTE — Progress Notes (Signed)
Subjective:    Patient ID: [name], female    DOB: 10-10-1934, 81 y.o.   MRN: [id number]  HPI 81 year old woman with a very remote and small tobacco history, history of hypertension, atrial fibrillation, coronary artery disease, psoriasis. She has secondary multifactorial pulmonary hypertension that has been presumed related to obesity with chronic hypoxemia, left-sided heart disease and presumed chronic thromboembolic disease. As noted she does also have a history of autoimmune process, psoriasis.  She is not on any targeted [place] therapy. Last seen by Dr [name] here in February 2015. She had a CT scan of the chest 10/05/13 that showed pulmonary nodules, the 2 most dominant were in the left upper lobe, and a subsequent PET scan on 04/11/14 showed that these were not hypermetabolic. I have personally reviewed the scans as well as a repeat CT scan done on 09/27/14 that showed no interval change. She is due for a final scan to confirm stability in February 2017. She underwent a polysomnogram in November 2013 with an AHI of 4.1 and RDI of 5.9.   She reports no SOB, is able to exert. Is able to do housework. She recently had a URI and cough, took 8 weeks to get over the cough. Was treated with pred and abx.   ROV 10/09/15 -- follow-up visit. She has a history of psoriasis, hypertension, atrial fibrillation. He also carries a history of presumed chronic thromboembolic disease. In evaluation for obstructive sleep apnea showed an AHI of 4.1. He has secondary pulmonary hypertension that has been deemed multifactorial. She underwent a repeat CT scan of her chest this month that I have personally reviewed. This showed that his pulmonary nodules are stable to smaller in size. She also had an echocardiogram on 08/30/15 that showed an estimated PASP of 70 mmHg and mild RV dysfunction.  She has occasional SOB but seldom, denies limitations. She uses a walker for balance.    ROV 02/08/16 -- [name] has a  history of hypertension and diastolic dysfunction, age fibrillation, psoriasis, and presumed chronic thromboembolic disease (never definitively proven, suggested by VQ scan September 2012). She has multifactorial pulmonary hypertension in the setting of all of this. I started her on adcirca and letaris in March which seem to exacerbate edema, volume overload and finally an exacerbation of her diastolic CHF. She had to be hospitalized for this and her diuretics were adjusted. Note she also has a history of pulmonary nodules which we have followed w serial scans and which are likely benign. She believes that she is much better compensated at this time, now on torsemide instead of furosemide. She is on Eliquis. She is on O2 at night. She is on flonase - still w some congestion. Occasional cough. She feels that she is a bit more active, denies dyspnea.   Right heart cath 12/07/15 RA = 10 RV = 58/2/9  PA = 59/17 (36) PCW = 16 (v wave to 30-35) Fick cardiac output/index = 5.2/2.6 PVR = 3.9 WU Ao sat = 97% PA sat = 65%, 66%   Review of Systems  Constitutional: Negative for fever and unexpected weight change.  HENT: Negative for congestion, dental problem, ear pain, nosebleeds, postnasal drip, rhinorrhea, sinus pressure, sneezing, sore throat and trouble swallowing.   Eyes: Negative for redness and itching.  Respiratory: Negative for cough, chest tightness, shortness of breath and wheezing.   Cardiovascular: Negative for palpitations and leg swelling.  Gastrointestinal: Negative for nausea and vomiting.  Genitourinary: Negative for dysuria.  Musculoskeletal:  Negative for joint swelling.  Skin: Negative for rash.  Neurological: Negative for headaches.  Hematological: Does not bruise/bleed easily.  Psychiatric/Behavioral: Negative for dysphoric mood. The patient is not nervous/anxious.     Past Medical History  Diagnosis Date  . Essential hypertension   . Hyperlipidemia   . Chronic atrial  fibrillation ([place])     Since 2011  . CAD (coronary artery disease)     BMS to LAD 07/2005  . Morbid obesity ([place])   . Psoriasis   . Right ventricular dysfunction   . Tricuspid regurgitation     Severe  . Pulmonary hypertension ([facility])     Mixed, PASP 67 mmHg at [facility] 2013  . GERD (gastroesophageal reflux disease)   . A-fib ([place])   . Myocardial infarction ([place]) 07/2005  . Degenerative arthritis of right knee   . Arthritis     "knees" (11/17/2015)  . On home oxygen therapy     "2.5L at night" (11/17/2015)     Family History  Problem Relation Age of Onset  . Heart attack Mother   . Coronary artery disease Father   . Stroke Father   . Heart attack Brother   . Heart failure Brother   . COPD Brother   . Emphysema Sister   . Emphysema Brother      Social History   Social History  . Marital Status: Married    Spouse Name: N/A  . Number of Children: 2  . Years of Education: N/A   Occupational History  . retired    Social History Main Topics  . Smoking status: Former Smoker -- 0.30 packs/day for .5 years    Types: Cigarettes    Quit date: 08/19/1986  . Smokeless tobacco: Never Used     Comment: pt only smoked for only a few months.  . Alcohol Use: No  . Drug Use: No  . Sexual Activity: No   Other Topics Concern  . Not on file   Social History Narrative     No Known Allergies   Outpatient Prescriptions Prior to Visit  Medication Sig Dispense Refill  . ALPRAZolam (XANAX) 0.5 MG tablet Take 0.5 mg by mouth at bedtime.     [name] alum hydroxide-mag trisilicate (GAVISCON) [id number] MG CHEW chewable tablet Chew 1 tablet by mouth 3 (three) times daily as needed for indigestion or heartburn.    [name] ambrisentan (LETAIRIS) 5 MG tablet Take 1 tablet (5 mg total) by mouth daily. 20 tablet 0  . apixaban (ELIQUIS) 5 MG TABS tablet Take 1 tablet (5 mg total) by mouth 2 (two) times daily. 60 tablet 3  . atorvastatin (LIPITOR) 80 MG tablet TAKE 1/2 TABLET BY MOUTH DAILY 45 tablet 2  . AZOPT  1 % ophthalmic suspension Place 1 drop into both eyes 3 (three) times daily.     . brimonidine (ALPHAGAN) 0.2 % ophthalmic solution Place 1 drop into both eyes 2 (two) times daily.     . Coenzyme Q10 (COQ10) 200 MG CAPS Take 200 mg by mouth daily.    . colchicine 0.6 MG tablet Take 0.6 mg by mouth daily.    . fluticasone (FLONASE) 50 MCG/ACT nasal spray Place 2 sprays into both nostrils daily.     [name] HYDROcodone-acetaminophen (NORCO/VICODIN) 5-325 MG tablet Take 1 tablet by mouth 2 (two) times daily as needed for moderate pain.     [name] latanoprost (XALATAN) 0.005 % ophthalmic solution Place 1 drop into the right eye at bedtime.     [name]  lidocaine (XYLOCAINE) 5 % ointment Apply 1 application topically daily as needed (knee pain).     [name] losartan (COZAAR) 100 MG tablet Take 100 mg by mouth every evening.    . nitroGLYCERIN (NITROSTAT) 0.4 MG SL tablet Place 0.4 mg under the tongue every 5 (five) minutes as needed for chest pain (x 3 tablets daily). Reported on 12/28/2015    . pantoprazole (PROTONIX) 20 MG tablet Take 20 mg by mouth daily.    . potassium chloride SA (K-DUR,KLOR-CON) 20 MEQ tablet Take 1 tablet (20 mEq total) by mouth daily. 30 tablet 3  . Probiotic Product (PROBIOTIC PO) Take 1 tablet by mouth 2 (two) times daily.    [name] spironolactone (ALDACTONE) 25 MG tablet Take 0.5 tablets (12.5 mg total) by mouth daily. 15 tablet 3  . tadalafil, PAH, (ADCIRCA) 20 MG tablet Take 2 tablets (40 mg total) by mouth daily. 60 tablet 5  . timolol (TIMOPTIC) 0.5 % ophthalmic solution Place 1 drop into both eyes 2 (two) times daily.    [name] torsemide (DEMADEX) 20 MG tablet Take 2 tablets (40 mg total) by mouth daily. 180 tablet 0  . doxycycline (DORYX) 100 MG EC tablet Take 100 mg by mouth 2 (two) times daily.     No facility-administered medications prior to visit.         Objective:   Physical Exam Filed Vitals:   02/08/16 1043  BP: 108/52  Pulse: 70  Height: 5\' 3"  (1.6 m)  Weight: 217 lb (98.431 kg)    SpO2: 92%   Gen: Pleasant, well-nourished, in no distress,  normal affect  ENT: No lesions,  mouth clear,  oropharynx clear, no postnasal drip  Neck: No JVD, no TMG, no carotid bruits  Lungs: No use of accessory muscles, no dullness to percussion, clear without rales or rhonchi  Cardiovascular: RRR, heart sounds normal, no murmur or gallops, no peripheral edema  Musculoskeletal: No deformities, no cyanosis or clubbing  Neuro: alert, non focal  Skin: Warm, no lesions or rashes     CT chest 10/03/15 --  COMPARISON: 09/27/2014  FINDINGS: Left upper lobe pulmonary nodule on image 21 measures 7.5 mm compared to 6.5 mm previously and 7.5 mm and 2015.  Anterior left upper lobe nodule on image 24 measures up to 9.5 mm compared with 9.5 mm previously.  Small left lower lobe pulmonary nodule peripherally on image 32 is stable. Left lower lobe pulmonary nodule on image 36 measures 6 mm compared with 8 mm previously.  Right lower lobe pulmonary nodule posteriorly on image 44 measures 4 mm and is stable.  No new pulmonary nodule. Heart is enlarged. Diffuse coronary artery calcifications. Pulmonary arteries are enlarged suggesting pulmonary arterial hypertension, stable. No mediastinal, hilar, or axillary adenopathy. Chest wall soft tissues are unremarkable. Imaging into the upper abdomen shows no acute findings.  No acute bony abnormality or focal bone lesion. Leftward scoliosis and degenerative changes in the thoracic spine.  IMPRESSION: Stable or slightly smaller bilateral pulmonary nodules. No new or enlarging pulmonary nodules. These are stable dating back to February of 2015 and are compatible with benign nodules.  Cardiomegaly, pulmonary arterial hypertension.  Coronary artery disease.      Assessment & Plan:  PAH (pulmonary artery hypertension) ([place]) Multifactorial secondary pulmonary hypertension. She has been able to tolerate adcirca and letaris now  that she is on a more aggressive diuretic regimen. Hopefully this will be reflected in improvement in her PA pressures and clinical status. Plan to repeat her  echocardiogram and 6 minute walk at the end of this year. I believe she needs repeat LFTs in October. If these are stable then we will not check as frequently. Continue anticoagulation for her PAH. Also note that she carries a history of possible thromboembolic disease although I don't believe this is definitively established. She is about to start cardiopulmonary rehabilitation which I wholeheartedly support.  Pulmonary nodules No further scans should be necessary based on stability on serial imaging  Chronic rhinitis Possibly exacerbated by the addition of adcirca. Continue her fluticasone nasal spray for now   Baltazar Apo, MD, PhD 02/08/2016, 11:08 AM [facility] [phone] or if no answer [phone]

## 2016-02-08 NOTE — Assessment & Plan Note (Signed)
Possibly exacerbated by the addition of adcirca. Continue her fluticasone nasal spray for now

## 2016-02-08 NOTE — Assessment & Plan Note (Signed)
No further scans should be necessary based on stability on serial imaging

## 2016-02-10 ENCOUNTER — Other Ambulatory Visit: Payer: Self-pay

## 2016-02-10 ENCOUNTER — Emergency Department (HOSPITAL_COMMUNITY): Payer: Medicare Other

## 2016-02-10 ENCOUNTER — Emergency Department (HOSPITAL_COMMUNITY)
Admission: EM | Admit: 2016-02-10 | Discharge: 2016-02-10 | Disposition: A | Discharge status: Home / Self Care | Payer: Medicare Other | Attending: Emergency Medicine | Admitting: Emergency Medicine

## 2016-02-10 DIAGNOSIS — R110 Nausea: Secondary | ICD-10-CM

## 2016-02-10 DIAGNOSIS — Z87891 Personal history of nicotine dependence: Secondary | ICD-10-CM | POA: Diagnosis not present

## 2016-02-10 DIAGNOSIS — E860 Dehydration: Secondary | ICD-10-CM | POA: Insufficient documentation

## 2016-02-10 DIAGNOSIS — I2510 Atherosclerotic heart disease of native coronary artery without angina pectoris: Secondary | ICD-10-CM | POA: Diagnosis not present

## 2016-02-10 DIAGNOSIS — Z7901 Long term (current) use of anticoagulants: Secondary | ICD-10-CM | POA: Insufficient documentation

## 2016-02-10 DIAGNOSIS — E669 Obesity, unspecified: Secondary | ICD-10-CM | POA: Diagnosis not present

## 2016-02-10 DIAGNOSIS — I252 Old myocardial infarction: Secondary | ICD-10-CM | POA: Diagnosis not present

## 2016-02-10 DIAGNOSIS — R42 Dizziness and giddiness: Secondary | ICD-10-CM | POA: Diagnosis not present

## 2016-02-10 DIAGNOSIS — I10 Essential (primary) hypertension: Secondary | ICD-10-CM | POA: Insufficient documentation

## 2016-02-10 DIAGNOSIS — Z6838 Body mass index (BMI) 38.0-38.9, adult: Secondary | ICD-10-CM | POA: Diagnosis not present

## 2016-02-10 LAB — BASIC METABOLIC PANEL
ANION GAP: 7 (ref 5–15)
BUN: 51 mg/dL — ABNORMAL HIGH (ref 6–20)
CALCIUM: 9.6 mg/dL (ref 8.9–10.3)
CO2: 28 mmol/L (ref 22–32)
Chloride: 100 mmol/L — ABNORMAL LOW (ref 101–111)
Creatinine, Ser: 1.21 mg/dL — ABNORMAL HIGH (ref 0.44–1.00)
GFR, EST AFRICAN AMERICAN: 48 mL/min — AB (ref >60)
GFR, EST NON AFRICAN AMERICAN: 42 mL/min — AB (ref >60)
Glucose, Bld: 93 mg/dL (ref 65–99)
Potassium: 4.3 mmol/L (ref 3.5–5.1)
SODIUM: 135 mmol/L (ref 135–145)

## 2016-02-10 LAB — URINALYSIS, ROUTINE W REFLEX MICROSCOPIC
BILIRUBIN URINE: NEGATIVE
Glucose, UA: NEGATIVE mg/dL
HGB URINE DIPSTICK: NEGATIVE
KETONES UR: NEGATIVE mg/dL
LEUKOCYTES UA: NEGATIVE
NITRITE: NEGATIVE
PH: 5.5 (ref 5.0–8.0)
Protein, ur: NEGATIVE mg/dL
SPECIFIC GRAVITY, URINE: 1.014 (ref 1.005–1.030)

## 2016-02-10 LAB — I-STAT TROPONIN, ED: TROPONIN I, POC: 0.01 ng/mL (ref 0.00–0.08)

## 2016-02-10 LAB — HEPATIC FUNCTION PANEL
ALBUMIN: 3.5 g/dL (ref 3.5–5.0)
ALT: 10 U/L — ABNORMAL LOW (ref 14–54)
AST: 23 U/L (ref 15–41)
Alkaline Phosphatase: 56 U/L (ref 38–126)
Bilirubin, Direct: 0.2 mg/dL (ref 0.1–0.5)
Indirect Bilirubin: 0.2 mg/dL — ABNORMAL LOW (ref 0.3–0.9)
TOTAL PROTEIN: 5.5 g/dL — AB (ref 6.5–8.1)
Total Bilirubin: 0.4 mg/dL (ref 0.3–1.2)

## 2016-02-10 LAB — CBC
HCT: 33.3 % — ABNORMAL LOW (ref 36.0–46.0)
HEMOGLOBIN: 10.7 g/dL — AB (ref 12.0–15.0)
MCH: 30.1 pg (ref 26.0–34.0)
MCHC: 32.1 g/dL (ref 30.0–36.0)
MCV: 93.5 fL (ref 78.0–100.0)
PLATELETS: 129 10*3/uL — AB (ref 150–400)
RBC: 3.56 MIL/uL — AB (ref 3.87–5.11)
RDW: 13.8 % (ref 11.5–15.5)
WBC: 4.1 10*3/uL (ref 4.0–10.5)

## 2016-02-10 MED ORDER — SODIUM CHLORIDE 0.9 % IV BOLUS (SEPSIS): 1000.0000 mL | Freq: Once | INTRAVENOUS | Status: DC

## 2016-02-10 MED ORDER — ONDANSETRON 8 MG PO TBDP: 8.0000 mg | ORAL_TABLET | Freq: Three times a day (TID) | ORAL | Status: DC | PRN

## 2016-02-10 MED ORDER — ONDANSETRON 4 MG PO TBDP
8.0000 mg | ORAL_TABLET | Freq: Once | ORAL | Status: AC
  Administered 2016-02-10: 8 mg via ORAL
  Filled 2016-02-10: qty 2

## 2016-02-10 NOTE — Discharge Instructions (Signed)
It was our pleasure to provide your ER care today - we hope that you feel better.  Rest. Drink plenty of fluids.   Take zofran as need for nausea.  Your lab work is consistent with mild dehydration, as your kidney function tests are mildly elevated - push fluids/drink plenty of fluids and follow up with your doctor this week.   Follow up with your doctor in the coming week - call office Monday AM to arrange appointment.   Return to ER if worse, new symptoms, fevers, persistent vomiting, abdominal pain, weak/faint, other concern.      Nausea, Adult Nausea is the feeling that you have an upset stomach or have to vomit. Nausea by itself is not likely a serious concern, but it may be an early sign of more serious medical problems. As nausea gets worse, it can lead to vomiting. If vomiting develops, there is the risk of dehydration.  CAUSES   Viral infections.  Food poisoning.  Medicines.  Pregnancy.  Motion sickness.  Migraine headaches.  Emotional distress.  Severe pain from any source.  Alcohol intoxication. HOME CARE INSTRUCTIONS  Get plenty of rest.  Ask your caregiver about specific rehydration instructions.  Eat small amounts of food and sip liquids more often.  Take all medicines as told by your caregiver. SEEK MEDICAL CARE IF:  You have not improved after 2 days, or you get worse.  You have a headache. SEEK IMMEDIATE MEDICAL CARE IF:   You have a fever.  You faint.  You keep vomiting or have blood in your vomit.  You are extremely weak or dehydrated.  You have dark or bloody stools.  You have severe chest or abdominal pain. MAKE SURE YOU:  Understand these instructions.  Will watch your condition.  Will get help right away if you are not doing well or get worse.   This information is not intended to replace advice given to you by your health care provider. Make sure you discuss any questions you have with your health care provider.     Document Released: 09/12/2004 Document Revised: 08/26/2014 Document Reviewed: 04/17/2011 Elsevier Interactive Patient Education 2016 Elsevier Inc[employer]   Dehydration, Adult Dehydration means your body does not have as much fluid or water as it needs. It happens when you take in less fluid than you lose. Your kidneys, brain, and heart will not work properly without the right amount of fluids.  Dehydration can range from mild to severe. It should be treated right away to help prevent it from becoming severe. HOME CARE  Drink enough fluid to keep your pee (urine) clear or pale yellow.  Drink water or fluid slowly by taking small sips. You can also try sucking on ice cubes.  Have food or drinks that contain electrolytes. Examples include bananas and sports drinks.  Take over-the-counter and prescription medicines only as told by your doctor.  Prepare oral rehydration solution (ORS) according to the instructions that came with it. Take sips of ORS every 5 minutes until your pee returns to normal.  If you are throwing up (vomiting) or have watery poop (diarrhea), keep trying to drink water, ORS, or both.  If you have watery poop, avoid:  Drinks with caffeine.  Fruit juice.  Milk.  Carbonated soft drinks.  Do not take salt tablets. This can lead to having too much sodium in your body (hypernatremia). GET HELP IF:  You cannot eat or drink without throwing up.  You have had mild watery poop for  longer than 24 hours.  You have a fever. GET HELP RIGHT AWAY IF:   You have very strong thirst.  You have very bad watery poop.  You have not peed in 6-8 hours, or you have peed only a small amount of very dark pee.  You have shriveled skin.  You are dizzy, confused, or both.   This information is not intended to replace advice given to you by your health care provider. Make sure you discuss any questions you have with your health care provider.   Document Released: 06/01/2009  Document Revised: 04/26/2015 Document Reviewed: 12/21/2014 Elsevier Interactive Patient Education [employer].

## 2016-02-10 NOTE — ED Notes (Signed)
Patient here with nausea and rapid heart beat intermittently x 1 day. Denies CP. Thinks the nausea may be related to her pulmonary hypertension medication.

## 2016-02-10 NOTE — ED Provider Notes (Signed)
CSN: [id number]     Arrival date & time 02/10/16  1104 History   First MD Initiated Contact with Patient 02/10/16 1142     Chief Complaint  Patient presents with  . Nausea  . rapid heart beat      (Consider location/radiation/quality/duration/timing/severity/associated sxs/prior Treatment) The history is provided by the patient.  Patient w hx cad, afib, pulm htn, presents c/o nausea.  Patient indicates she feels is due to being placed on Adcirca and Letairis 3 months ago. States saw her Gi doctor, Byrum, this past week with flushing, which she thought was due to these meds - he told her do continue on meds.  No other recent change in other meds. Patient denies vomiting. Normal po intake. No abd distension or pain. Having normal bms. No dysuria or gu c/o. No chest pain. No sob. No headaches.        Past Medical History  Diagnosis Date  . Essential hypertension   . Hyperlipidemia   . Chronic atrial fibrillation ([place])     Since 2011  . CAD (coronary artery disease)     BMS to LAD 07/2005  . Morbid obesity ([place])   . Psoriasis   . Right ventricular dysfunction   . Tricuspid regurgitation     Severe  . Pulmonary hypertension ([facility])     Mixed, PASP 67 mmHg at [facility] 2013  . GERD (gastroesophageal reflux disease)   . A-fib ([place])   . Myocardial infarction ([place]) 07/2005  . Degenerative arthritis of right knee   . Arthritis     "knees" (11/17/2015)  . On home oxygen therapy     "2.5L at night" (11/17/2015)   Past Surgical History  Procedure Laterality Date  . Achilles tendon surgery Right 1970s  . Colonoscopy    . Carpal tunnel release  11/29/2011    Procedure: CARPAL TUNNEL RELEASE;  Surgeon: Cammie Sickle., MD;  Location: [facility];  Service: Orthopedics;  Laterality: Right;  . Laparoscopic cholecystectomy    . Retinal detachment surgery Right   . Eye surgery    . Coronary angioplasty with stent placement  07/2005    Dr. Olevia Perches  . Cardiac catheterization N/A  12/07/2015    Procedure: Right Heart Cath;  Surgeon: Jolaine Artist, MD;  Location: [facility];  Service: Cardiovascular;  Laterality: N/A;   Family History  Problem Relation Age of Onset  . Heart attack Mother   . Coronary artery disease Father   . Stroke Father   . Heart attack Brother   . Heart failure Brother   . COPD Brother   . Emphysema Sister   . Emphysema Brother    Social History  Substance Use Topics  . Smoking status: Former Smoker -- 0.30 packs/day for .5 years    Types: Cigarettes    Quit date: 08/19/1986  . Smokeless tobacco: Never Used     Comment: pt only smoked for only a few months.  . Alcohol Use: No   OB History    No data available     Review of Systems  Constitutional: Negative for fever and chills.  HENT: Negative for sore throat.   Eyes: Negative for redness.  Respiratory: Negative for cough and shortness of breath.   Cardiovascular: Negative for chest pain.  Gastrointestinal: Positive for nausea. Negative for vomiting, abdominal pain and diarrhea.  Genitourinary: Negative for dysuria and flank pain.  Musculoskeletal: Negative for back pain.  Skin: Negative for rash.  Neurological: Negative  for headaches.  Hematological: Does not bruise/bleed easily.  Psychiatric/Behavioral: Negative for confusion.      Allergies  Review of patient's allergies indicates no known allergies.  Home Medications   Prior to Admission medications   Medication Sig Start Date End Date Taking? Authorizing Provider  ALPRAZolam Duanne Moron) 0.5 MG tablet Take 0.5 mg by mouth at bedtime.     Historical Provider, MD  alum hydroxide-mag trisilicate (GAVISCON) [id number] MG CHEW chewable tablet Chew 1 tablet by mouth 3 (three) times daily as needed for indigestion or heartburn.    Historical Provider, MD  ambrisentan (LETAIRIS) 5 MG tablet Take 1 tablet (5 mg total) by mouth daily. 11/30/15   Collene Gobble, MD  apixaban (ELIQUIS) 5 MG TABS tablet Take 1 tablet (5 mg  total) by mouth 2 (two) times daily. 01/10/16   Jolaine Artist, MD  atorvastatin (LIPITOR) 80 MG tablet TAKE 1/2 TABLET BY MOUTH DAILY 08/07/15   Satira Sark, MD  AZOPT 1 % ophthalmic suspension Place 1 drop into both eyes 3 (three) times daily.  05/02/12   Historical Provider, MD  brimonidine (ALPHAGAN) 0.2 % ophthalmic solution Place 1 drop into both eyes 2 (two) times daily.  04/21/15   Historical Provider, MD  Coenzyme Q10 (COQ10) 200 MG CAPS Take 200 mg by mouth daily.    Historical Provider, MD  colchicine 0.6 MG tablet Take 0.6 mg by mouth daily.    Historical Provider, MD  fluticasone (FLONASE) 50 MCG/ACT nasal spray Place 2 sprays into both nostrils daily.  05/29/15   Historical Provider, MD  HYDROcodone-acetaminophen (NORCO/VICODIN) 5-325 MG tablet Take 1 tablet by mouth 2 (two) times daily as needed for moderate pain.  03/26/15   Historical Provider, MD  latanoprost (XALATAN) 0.005 % ophthalmic solution Place 1 drop into the right eye at bedtime.  03/30/12   Historical Provider, MD  lidocaine (XYLOCAINE) 5 % ointment Apply 1 application topically daily as needed (knee pain).  04/10/15   Historical Provider, MD  losartan (COZAAR) 100 MG tablet Take 100 mg by mouth every evening.    Historical Provider, MD  nitroGLYCERIN (NITROSTAT) 0.4 MG SL tablet Place 0.4 mg under the tongue every 5 (five) minutes as needed for chest pain (x 3 tablets daily). Reported on 12/28/2015    Historical Provider, MD  pantoprazole (PROTONIX) 20 MG tablet Take 20 mg by mouth daily.    Historical Provider, MD  potassium chloride SA (K-DUR,KLOR-CON) 20 MEQ tablet Take 1 tablet (20 mEq total) by mouth daily. 12/28/15   Jolaine Artist, MD  Probiotic Product (PROBIOTIC PO) Take 1 tablet by mouth 2 (two) times daily.    Historical Provider, MD  spironolactone (ALDACTONE) 25 MG tablet Take 0.5 tablets (12.5 mg total) by mouth daily. 12/28/15   Jolaine Artist, MD  tadalafil, PAH, (ADCIRCA) 20 MG tablet Take 2  tablets (40 mg total) by mouth daily. 02/05/16   Collene Gobble, MD  timolol (TIMOPTIC) 0.5 % ophthalmic solution Place 1 drop into both eyes 2 (two) times daily. 11/20/15   Historical Provider, MD  torsemide (DEMADEX) 20 MG tablet Take 2 tablets (40 mg total) by mouth daily. 11/22/15   Shanker Kristeen Mans, MD   BP 108/62 mmHg  Pulse 74  Temp(Src) 98.3 F (36.8 C) (Oral)  Resp 18  Ht 5\' 3"  (1.6 m)  Wt 97.523 kg  BMI 38.09 kg/m2  SpO2 97% Physical Exam  Constitutional: She appears well-developed and well-nourished. No distress.  HENT:  Mouth/Throat:  Oropharynx is clear and moist.  Eyes: Conjunctivae are normal. No scleral icterus.  Neck: Neck supple. No tracheal deviation present.  Cardiovascular: Normal rate, regular rhythm, normal heart sounds and intact distal pulses.  Exam reveals no gallop and no friction rub.   No murmur heard. Pulmonary/Chest: Effort normal and breath sounds normal. No respiratory distress.  Abdominal: Soft. Normal appearance and bowel sounds are normal. She exhibits no distension and no mass. There is no tenderness. There is no rebound and no guarding.  Genitourinary:  No cva tenderness  Musculoskeletal: She exhibits no edema.  Neurological: She is alert.  Skin: Skin is warm and dry. No rash noted. She is not diaphoretic.  Psychiatric: She has a normal mood and affect.  Nursing note and vitals reviewed.   ED Course  Procedures (including critical care time) Labs Review  Results for orders placed or performed during the hospital encounter of [id number]  Basic metabolic panel  Result Value Ref Range   Sodium 135 135 - 145 mmol/L   Potassium 4.3 3.5 - 5.1 mmol/L   Chloride 100 (L) 101 - 111 mmol/L   CO2 28 22 - 32 mmol/L   Glucose, Bld 93 65 - 99 mg/dL   BUN 51 (H) 6 - 20 mg/dL   Creatinine, Ser 1.21 (H) 0.44 - 1.00 mg/dL   Calcium 9.6 8.9 - 10.3 mg/dL   GFR calc non Af Amer 42 (L) >60 mL/min   GFR calc Af Amer 48 (L) >60 mL/min   Anion gap 7 5 - 15  CBC   Result Value Ref Range   WBC 4.1 4.0 - 10.5 K/uL   RBC 3.56 (L) 3.87 - 5.11 MIL/uL   Hemoglobin 10.7 (L) 12.0 - 15.0 g/dL   HCT 33.3 (L) 36.0 - 46.0 %   MCV 93.5 78.0 - 100.0 fL   MCH 30.1 26.0 - 34.0 pg   MCHC 32.1 30.0 - 36.0 g/dL   RDW 13.8 11.5 - 15.5 %   Platelets 129 (L) 150 - 400 K/uL  Hepatic function panel  Result Value Ref Range   Total Protein 5.5 (L) 6.5 - 8.1 g/dL   Albumin 3.5 3.5 - 5.0 g/dL   AST 23 15 - 41 U/L   ALT 10 (L) 14 - 54 U/L   Alkaline Phosphatase 56 38 - 126 U/L   Total Bilirubin 0.4 0.3 - 1.2 mg/dL   Bilirubin, Direct 0.2 0.1 - 0.5 mg/dL   Indirect Bilirubin 0.2 (L) 0.3 - 0.9 mg/dL  I-Stat Troponin, ED (not at [facility])  Result Value Ref Range   Troponin i, poc 0.01 0.00 - 0.08 ng/mL   Comment 3           Dg Chest 2 View  02/10/2016  CLINICAL DATA:  Palpitations, nausea and dizziness. EXAM: CHEST  2 VIEW COMPARISON:  11/20/2015 FINDINGS: Stable mild cardiac enlargement. Stable visualization of LAD coronary stent in the lateral projection. Stable prominent caliber of central pulmonary arteries, likely reflecting underlying pulmonary hypertension. There is no evidence of pulmonary edema, consolidation, pneumothorax, nodule or pleural fluid. IMPRESSION: No acute findings. Stable cardiac enlargement and dilated central pulmonary arteries. Electronically Signed   By: Aletta Edouard M.D.   On: 02/10/2016 12:40       I have personally reviewed and evaluated these images and lab results as part of my medical decision-making.   EKG Interpretation   Date/Time:  Saturday February 10 2016 11:18:56 EDT Ventricular Rate:  77 PR Interval:    QRS  Duration: 64 QT Interval:  394 QTC Calculation: 445 R Axis:   -43 Text Interpretation:  Atrial fibrillation with premature ventricular or  aberrantly conducted complexes Left axis deviation Nonspecific T wave  abnormality No significant change since last tracing Confirmed by Ashok Cordia   MD, Lennette Bihari ([id number]) on 02/10/2016  11:26:16 AM      MDM   Labs. Monitor.  Reviewed nursing notes and prior charts for additional history.   ivf ordered. Zofran.  Recheck patient indicates nausea improved/resolved, and can take po, and does not currently have iv, therefore will push po fluids/orally hydrate.  abd soft nt.  Discussed w pt, given recent symptoms flushing, and now nausea, is possible related to her meds/med side effect.  Patient is afeb. Denies any pain. abd soft nt.   Rec close pcp f/u.   Return precautions provided.     Lajean Saver, MD 02/10/16 [phone]

## 2016-02-12 DIAGNOSIS — H401113 Primary open-angle glaucoma, right eye, severe stage: Secondary | ICD-10-CM | POA: Diagnosis not present

## 2016-02-13 ENCOUNTER — Encounter (HOSPITAL_COMMUNITY)
Admission: RE | Admit: 2016-02-13 | Discharge: 2016-02-13 | Disposition: A | Discharge status: Home / Self Care | Payer: Medicare Other | Source: Ambulatory Visit | Attending: Internal Medicine | Admitting: Internal Medicine

## 2016-02-13 ENCOUNTER — Encounter (HOSPITAL_COMMUNITY): Payer: Medicare Other

## 2016-02-13 DIAGNOSIS — I5032 Chronic diastolic (congestive) heart failure: Secondary | ICD-10-CM

## 2016-02-13 NOTE — Progress Notes (Signed)
Daily Session Note  Patient Details  Name: [name] MRN: [id number] Date of Birth: 04-09-1935 Referring Provider:        PULMONARY REHAB OTHER RESP ORIENTATION from 02/06/2016 in [facility]   Referring Provider  Dr. Haroldine Laws      Encounter Date: 02/13/2016  Check In:     Session Check In - 02/13/16 1045    Check-In   Location [facility]   Staff Present Suzanne Boron, BS, EP, Exercise Physiologist;Diane Coad, MS, EP, [facility], Exercise Physiologist   Supervising physician immediately available to respond to emergencies See telemetry face sheet for immediately available MD   Medication changes reported     No   Fall or balance concerns reported    No   Warm-up and Cool-down Performed as group-led instruction   Resistance Training Performed Yes   VAD Patient? No   Pain Assessment   Currently in Pain? No/denies   Pain Score 0-No pain   Multiple Pain Sites No      Capillary Blood Glucose: No results found for this or any previous visit (from the past 24 hour(s)).   Goals Met:  Independence with exercise equipment Exercise tolerated well No report of cardiac concerns or symptoms Strength training completed today  Goals Unmet:  Not Applicable  Comments: Check out 1145   Dr. Kate Sable is Medical Director for [place] and Pulmonary Rehab.

## 2016-02-13 NOTE — Progress Notes (Signed)
Patient needed to get off of treadmill at 10 minutes due to fatigue. Client rested 5 minutes and resumed exercise on next piece of equipment.

## 2016-02-13 NOTE — Progress Notes (Signed)
Pulmonary Individual Treatment Plan  Patient Details  Name: [name] MRN: [id number] Date of Birth: 1935-03-30 Referring Provider:        PULMONARY REHAB OTHER RESP ORIENTATION from 02/06/2016 in [place]   Referring Provider  Dr. Haroldine Laws      Initial Encounter Date:       [place] from 02/06/2016 in [place]   Date  02/06/16   Referring Provider  Dr. Haroldine Laws      Visit Diagnosis: Heart failure, diastolic, chronic ([place])  Patient's Home Medications on Admission:   Current outpatient prescriptions:  .  ALPRAZolam (XANAX) 0.5 MG tablet, Take 0.5 mg by mouth at bedtime. , Disp: , Rfl:  .  alum hydroxide-mag trisilicate (GAVISCON) [id number] MG CHEW chewable tablet, Chew 1 tablet by mouth 3 (three) times daily as needed for indigestion or heartburn., Disp: , Rfl:  .  ambrisentan (LETAIRIS) 5 MG tablet, Take 1 tablet (5 mg total) by mouth daily., Disp: 20 tablet, Rfl: 0 .  apixaban (ELIQUIS) 5 MG TABS tablet, Take 1 tablet (5 mg total) by mouth 2 (two) times daily., Disp: 60 tablet, Rfl: 3 .  atorvastatin (LIPITOR) 80 MG tablet, TAKE 1/2 TABLET BY MOUTH DAILY, Disp: 45 tablet, Rfl: 2 .  AZOPT 1 % ophthalmic suspension, Place 1 drop into both eyes 3 (three) times daily. , Disp: , Rfl:  .  brimonidine (ALPHAGAN) 0.2 % ophthalmic solution, Place 1 drop into both eyes 2 (two) times daily. , Disp: , Rfl:  .  Coenzyme Q10 (COQ10) 200 MG CAPS, Take 200 mg by mouth daily., Disp: , Rfl:  .  colchicine 0.6 MG tablet, Take 0.6 mg by mouth daily., Disp: , Rfl:  .  fluticasone (FLONASE) 50 MCG/ACT nasal spray, Place 2 sprays into both nostrils daily. , Disp: , Rfl:  .  HYDROcodone-acetaminophen (NORCO/VICODIN) 5-325 MG tablet, Take 1 tablet by mouth 2 (two) times daily as needed for moderate pain. , Disp: , Rfl:  .  latanoprost (XALATAN) 0.005 % ophthalmic solution, Place 1 drop into the right eye at bedtime. ,  Disp: , Rfl:  .  lidocaine (XYLOCAINE) 5 % ointment, Apply 1 application topically daily as needed (knee pain). , Disp: , Rfl:  .  losartan (COZAAR) 100 MG tablet, Take 100 mg by mouth every evening., Disp: , Rfl:  .  nitroGLYCERIN (NITROSTAT) 0.4 MG SL tablet, Place 0.4 mg under the tongue every 5 (five) minutes as needed for chest pain (x 3 tablets daily). Reported on 12/28/2015, Disp: , Rfl:  .  ondansetron (ZOFRAN ODT) 8 MG disintegrating tablet, Take 1 tablet (8 mg total) by mouth every 8 (eight) hours as needed for nausea or vomiting., Disp: 10 tablet, Rfl: 0 .  pantoprazole (PROTONIX) 20 MG tablet, Take 20 mg by mouth daily. Reported on 02/10/2016, Disp: , Rfl:  .  potassium chloride SA (K-DUR,KLOR-CON) 20 MEQ tablet, Take 1 tablet (20 mEq total) by mouth daily., Disp: 30 tablet, Rfl: 3 .  Probiotic Product (PROBIOTIC PO), Take 1 tablet by mouth 2 (two) times daily., Disp: , Rfl:  .  spironolactone (ALDACTONE) 25 MG tablet, Take 0.5 tablets (12.5 mg total) by mouth daily., Disp: 15 tablet, Rfl: 3 .  tadalafil, PAH, (ADCIRCA) 20 MG tablet, Take 2 tablets (40 mg total) by mouth daily., Disp: 60 tablet, Rfl: 5 .  timolol (TIMOPTIC) 0.5 % ophthalmic solution, Place 1 drop into both eyes 2 (two) times daily., Disp: , Rfl:  .  torsemide (DEMADEX) 20 MG tablet, Take 2 tablets (40 mg total) by mouth daily., Disp: 180 tablet, Rfl: 0  Past Medical History: Past Medical History  Diagnosis Date  . Essential hypertension   . Hyperlipidemia   . Chronic atrial fibrillation ([place])     Since 2011  . CAD (coronary artery disease)     BMS to LAD 07/2005  . Morbid obesity ([place])   . Psoriasis   . Right ventricular dysfunction   . Tricuspid regurgitation     Severe  . Pulmonary hypertension (HCC)     Mixed, PASP 67 mmHg at [facility] 2013  . GERD (gastroesophageal reflux disease)   . A-fib ([place])   . Myocardial infarction ([place]) 07/2005  . Degenerative arthritis of right knee   . Arthritis     "knees"  (11/17/2015)  . On home oxygen therapy     "2.5L at night" (11/17/2015)    Tobacco Use: History  Smoking status  . Former Smoker -- 0.30 packs/day for .5 years  . Types: Cigarettes  . Quit date: 08/19/1986  Smokeless tobacco  . Never Used    Comment: pt only smoked for only a few months.    Labs:     Recent Review Flowsheet Data    Labs for ITP Cardiac and Pulmonary Rehab Latest Ref Rng 08/04/2012 08/04/2012 08/04/2012 12/07/2015 12/07/2015   PHART 7.350 - 7.450 7.379 - - - -   PCO2ART 35.0 - 45.0 mmHg 39.6 - - - -   HCO3 20.0 - 24.0 mEq/L 23.4 24.9(H) 24.2(H) 29.5(H) 28.7(H)   TCO2 0 - 100 mmol/L 25 26 25 31 30    ACIDBASEDEF 0.0 - 2.0 mmol/L 2.0 - 1.0 - -   O2SAT - 94.0 61.0 61.0 66.0 65.0      Capillary Blood Glucose: Lab Results  Component Value Date   GLUCAP 97 04/09/2014     ADL UCSD:     Pulmonary Assessment Scores      02/06/16 1712       ADL UCSD   ADL Phase Entry     SOB Score total 50     Rest 0     Walk 6     Stairs 3     Bath 2     Dress 2     Shop 3     CAT Score   CAT Score 16     mMRC Score   mMRC Score 2        Pulmonary Function Assessment:     Pulmonary Function Assessment - 02/06/16 1709    Pulmonary Function Tests   FVC% 46 %   FEV1% 47 %   FEV1/FVC Ratio 74   RV% 98 %   DLCO% 27 %   Initial Spirometry Results   FVC% 1.31 %   FEV1% 0.93 %   FEV1/FVC Ratio 71   Post Bronchodilator Spirometry Results   FVC% 1.26 %   FEV1% 0.94 %   FEV1/FVC Ratio 74   Breath   Shortness of Breath Yes      Exercise Target Goals:    Exercise Program Goal: Individual exercise prescription set with THRR, safety & activity barriers. Participant demonstrates ability to understand and report RPE using BORG scale, to self-measure pulse accurately, and to acknowledge the importance of the exercise prescription.  Exercise Prescription Goal: Starting with aerobic activity 30 plus minutes a day, 3 days per week for initial exercise  prescription. Provide home exercise prescription and guidelines that participant acknowledges understanding prior  to discharge.  Activity Barriers & Risk Stratification:     Activity Barriers & Cardiac Risk Stratification - 02/06/16 1651    Activity Barriers & Cardiac Risk Stratification   Activity Barriers Back Problems;Deconditioning  Left knee problems   Cardiac Risk Stratification High      6 Minute Walk:     6 Minute Walk      02/07/16 1400       6 Minute Walk   Phase Initial     Distance 735 feet     Walk Time 6 minutes     # of Rest Breaks 0     MPH 1.39     METS 2.06     RPE 13     Perceived Dyspnea  13     VO2 Peak 3.38     Symptoms No     Resting HR 62 bpm     Resting BP 110/60 mmHg     Max Ex. HR 103 bpm     Max Ex. BP 144/62 mmHg     2 Minute Post BP 122/60 mmHg        Initial Exercise Prescription:     Initial Exercise Prescription - 02/07/16 1400    Date of Initial Exercise RX and Referring Provider   Date 02/06/16   Referring Provider Dr. Haroldine Laws   Treadmill   MPH 0.6   Grade 0   Minutes 15   METs 1.4   NuStep   Level 2   Watts 11   Minutes 15   METs 1.8   Arm Ergometer   Level 1.5   Watts 15   Minutes 15   METs 2.1   Prescription Details   Frequency (times per week) 2   Duration Progress to 30 minutes of continuous aerobic without signs/symptoms of physical distress   Intensity   THRR REST +  20   THRR 40-80% of Max Heartrate [id number]   Ratings of Perceived Exertion 11-13   Perceived Dyspnea 0-4   Progression   Progression Continue to progress workloads to maintain intensity without signs/symptoms of physical distress.   Resistance Training   Training Prescription Yes   Weight 1   Reps 10-12      Perform Capillary Blood Glucose checks as needed.  Exercise Prescription Changes:      Exercise Prescription Changes      02/13/16 1200 02/13/16 1700         Exercise Review   Progression No No      Response to  Exercise   Blood Pressure (Admit) 98/50 mmHg 98/50 mmHg      Blood Pressure (Exercise) 122/60 mmHg 122/60 mmHg      Blood Pressure (Exit) 92/50 mmHg 92/50 mmHg      Heart Rate (Admit) 54 bpm 54 bpm      Heart Rate (Exercise) 60 bpm 60 bpm      Heart Rate (Exit) 58 bpm 58 bpm      Oxygen Saturation (Admit) 90 % 90 %      Oxygen Saturation (Exercise) 93 % 93 %      Oxygen Saturation (Exit) 90 % 90 %      Rating of Perceived Exertion (Exercise) 13 13      Perceived Dyspnea (Exercise) 11 11      Comments Patient discontinued treadmill at 10 minutes due to fatigue. Her prescription will be changed to Nustep to prevent this fatigue. Patient discontinued treadmill at 10 minutes due to fatigue. Her prescription will  be changed to Nustep to prevent this fatigue.      Duration Progress to 30 minutes of continuous aerobic without signs/symptoms of physical distress Progress to 30 minutes of continuous aerobic without signs/symptoms of physical distress      Intensity Rest + 20 Rest + 20      Progression   Progression Continue to progress workloads to maintain intensity without signs/symptoms of physical distress. Continue to progress workloads to maintain intensity without signs/symptoms of physical distress.      Resistance Training   Training Prescription No No      Weight 1 1      Reps 10-12 8-10      Treadmill   MPH  0.06      Grade  0      Minutes  15      METs  1.49      NuStep   Level 2       Watts 11       Minutes 15       METs 1.8       Arm Ergometer   Level 1.5 1.5      Watts 12 12      Minutes 20 20      METs 1.8 1.8      Home Exercise Plan   Plans to continue exercise at [place] 3 additional days to program exercise sessions. Add 3 additional days to program exercise sessions.         Exercise Comments:   Discharge Exercise Prescription (Final Exercise Prescription Changes):     Exercise Prescription Changes - 02/13/16 1700    Exercise Review    Progression No   Response to Exercise   Blood Pressure (Admit) 98/50 mmHg   Blood Pressure (Exercise) 122/60 mmHg   Blood Pressure (Exit) 92/50 mmHg   Heart Rate (Admit) 54 bpm   Heart Rate (Exercise) 60 bpm   Heart Rate (Exit) 58 bpm   Oxygen Saturation (Admit) 90 %   Oxygen Saturation (Exercise) 93 %   Oxygen Saturation (Exit) 90 %   Rating of Perceived Exertion (Exercise) 13   Perceived Dyspnea (Exercise) 11   Comments Patient discontinued treadmill at 10 minutes due to fatigue. Her prescription will be changed to Nustep to prevent this fatigue.   Duration Progress to 30 minutes of continuous aerobic without signs/symptoms of physical distress   Intensity Rest + 20   Progression   Progression Continue to progress workloads to maintain intensity without signs/symptoms of physical distress.   Resistance Training   Training Prescription No   Weight 1   Reps 8-10   Treadmill   MPH 0.06   Grade 0   Minutes 15   METs 1.49   Arm Ergometer   Level 1.5   Watts 12   Minutes 20   METs 1.8   Home Exercise Plan   Plans to continue exercise at Home   Frequency Add 3 additional days to program exercise sessions.      Nutrition:  Target Goals: Understanding of nutrition guidelines, daily intake of sodium 1500mg , cholesterol 200mg , calories 30% from fat and 7% or less from saturated fats, daily to have 5 or more servings of fruits and vegetables.  Biometrics:     Pre Biometrics - 02/07/16 1404    Pre Biometrics   Height 5\' 3"  (1.6 m)   Weight 217 lb 9.5 oz (98.7 kg)   Waist Circumference 50 inches  Hip Circumference 47.5 inches   Waist to Hip Ratio 1.05 %   BMI (Calculated) 38.6   Triceps Skinfold 21 mm   % Body Fat 29 %   Grip Strength 40.3 kg   Flexibility 0 in   Single Leg Stand 0 seconds       Nutrition Therapy Plan and Nutrition Goals:   Nutrition Discharge: Rate Your Plate Scores:     Nutrition Assessments - 02/06/16 1715    Rate Your Plate Scores   Pre  Score 64      Psychosocial: Target Goals: Acknowledge presence or absence of depression, maximize coping skills, provide positive support system. Participant is able to verbalize types and ability to use techniques and skills needed for reducing stress and depression.  Initial Review & Psychosocial Screening:     Initial Psych Review & Screening - 02/06/16 1719    Initial Review   Current issues with --  No issues   Family Dynamics   Good Support System? Yes   Barriers   Psychosocial barriers to participate in program There are no identifiable barriers or psychosocial needs.   Screening Interventions   Interventions Encouraged to exercise      Quality of Life Scores:     Quality of Life - 02/07/16 1406    Quality of Life Scores   Health/Function Pre 7.42 %   Socioeconomic Pre 1.07 %   Psych/Spiritual Pre 1.42 %   Family Pre 1.2 %   GLOBAL Pre 3.82 %      PHQ-9:     Recent Review Flowsheet Data    Depression screen [facility] 2/9 02/06/2016   Decreased Interest 0   Down, Depressed, Hopeless 0   PHQ - 2 Score 0   Altered sleeping 0   Tired, decreased energy 0   Change in appetite 0   Feeling bad or failure about yourself  0   Trouble concentrating 0   Moving slowly or fidgety/restless 0   Suicidal thoughts 0   PHQ-9 Score 0   Difficult doing work/chores Somewhat difficult      Psychosocial Evaluation and Intervention:     Psychosocial Evaluation - 02/13/16 1800    Psychosocial Evaluation & Interventions   Interventions Encouraged to exercise with the program and follow exercise prescription;Relaxation education   Continued Psychosocial Services Needed No      Psychosocial Re-Evaluation:   Education: Education Goals: Education classes will be provided on a weekly basis, covering required topics. Participant will state understanding/return demonstration of topics presented.  Learning Barriers/Preferences:     Learning Barriers/Preferences - 02/06/16 1652     Learning Barriers/Preferences   Learning Barriers None   Learning Preferences Written Material      Education Topics: How Lungs Work and Diseases: - Discuss the anatomy of the lungs and diseases that can affect the lungs, such as COPD.   Exercise: -Discuss the importance of exercise, FITT principles of exercise, normal and abnormal responses to exercise, and how to exercise safely.   Environmental Irritants: -Discuss types of environmental irritants and how to limit exposure to environmental irritants.   Meds/Inhalers and oxygen: - Discuss respiratory medications, definition of an inhaler and oxygen, and the proper way to use an inhaler and oxygen.   Energy Saving Techniques: - Discuss methods to conserve energy and decrease shortness of breath when performing activities of daily living.    Bronchial Hygiene / Breathing Techniques: - Discuss breathing mechanics, pursed-lip breathing technique,  proper posture, effective ways to clear airways, and  other functional breathing techniques   Cleaning Equipment: - Provides group verbal and written instruction about the health risks of elevated stress, cause of high stress, and healthy ways to reduce stress.   Nutrition I: Fats: - Discuss the types of cholesterol, what cholesterol does to the body, and how cholesterol levels can be controlled.   Nutrition II: Labels: -Discuss the different components of food labels and how to read food labels.   Respiratory Infections: - Discuss the signs and symptoms of respiratory infections, ways to prevent respiratory infections, and the importance of seeking medical treatment when having a respiratory infection.   Stress I: Signs and Symptoms: - Discuss the causes of stress, how stress may lead to anxiety and depression, and ways to limit stress.   Stress II: Relaxation: -Discuss relaxation techniques to limit stress.   Oxygen for Home/Travel: - Discuss how to prepare for  travel when on oxygen and proper ways to transport and store oxygen to ensure safety.   Knowledge Questionnaire Score:     Knowledge Questionnaire Score - 02/06/16 1708    Knowledge Questionnaire Score   Pre Score 11/14      Core Components/Risk Factors/Patient Goals at Admission:     Personal Goals and Risk Factors at Admission - 02/06/16 1716    Core Components/Risk Factors/Patient Goals on Admission    Weight Management Weight Loss   Increase Strength and Stamina Yes   Intervention Provide advice, education, support and counseling about physical activity/exercise needs.;Develop an individualized exercise prescription for aerobic and resistive training based on initial evaluation findings, risk stratification, comorbidities and participant's personal goals.   Expected Outcomes Achievement of increased cardiorespiratory fitness and enhanced flexibility, muscular endurance and strength shown through measurements of functional capacity and personal statement of participant.   Improve shortness of breath with ADL's Yes   Intervention Provide education, individualized exercise plan and daily activity instruction to help decrease symptoms of SOB with activities of daily living.   Expected Outcomes Short Term: Achieves a reduction of symptoms when performing activities of daily living.   Develop more efficient breathing techniques such as purse lipped breathing and diaphragmatic breathing; and practicing self-pacing with activity Yes   Intervention Provide education, demonstration and support about specific breathing techniuqes utilized for more efficient breathing. Include techniques such as pursed lipped breathing, diaphragmatic breathing and self-pacing activity.   Expected Outcomes Short Term: Participant will be able to demonstrate and use breathing techniques as needed throughout daily activities.   Heart Failure Yes   Intervention Provide a combined exercise and nutrition program that  is supplemented with education, support and counseling about heart failure. Directed toward relieving symptoms such as shortness of breath, decreased exercise tolerance, and extremity edema.   Expected Outcomes Improve functional capacity of life;Short term: Attendance in program 2-3 days a week with increased exercise capacity. Reported lower sodium intake. Reported increased fruit and vegetable intake. Reports medication compliance.;Short term: Daily weights obtained and reported for increase. Utilizing diuretic protocols set by physician.;Long term: Adoption of self-care skills and reduction of barriers for early signs and symptoms recognition and intervention leading to self-care maintenance.   Personal Goal Other Yes   Personal Goal Breath better, understand her donction better, feel better overall and continue to feel better.    Intervention Attend 2xweek in [place] and supplement with 2 more days at home of exercise.    Expected Outcomes To feel stronger and breath better      Core Components/Risk Factors/Patient Goals Review:  Goals and Risk Factor Review      02/13/16 1759           Core Components/Risk Factors/Patient Goals Review   Personal Goals Review Weight Management/Obesity;Increase Strength and Stamina;Develop more efficient breathing techniques such as purse lipped breathing and diaphragmatic breathing and practicing self-pacing with activity.       Expected Outcomes Get better overall, stay better, breath better          Core Components/Risk Factors/Patient Goals at Discharge (Final Review):      Goals and Risk Factor Review - 02/13/16 1759    Core Components/Risk Factors/Patient Goals Review   Personal Goals Review Weight Management/Obesity;Increase Strength and Stamina;Develop more efficient breathing techniques such as purse lipped breathing and diaphragmatic breathing and practicing self-pacing with activity.   Expected Outcomes Get better overall, stay better,  breath better      ITP Comments:   Comments: ITP REVIEW Pt is making expected progress toward personal goals after completing 2 sessions.   Recommend continued exercise, life style modification, education, and utilization of breathing techniques to increase stamina and strength and decrease shortness of breath with exertion.

## 2016-02-15 ENCOUNTER — Encounter (HOSPITAL_COMMUNITY): Payer: Medicare Other

## 2016-02-15 ENCOUNTER — Telehealth: Payer: Self-pay | Admitting: Emergency Medicine

## 2016-02-15 ENCOUNTER — Encounter (HOSPITAL_COMMUNITY)
Admission: RE | Admit: 2016-02-15 | Discharge: 2016-02-15 | Disposition: A | Discharge status: Home / Self Care | Payer: Medicare Other | Source: Ambulatory Visit | Attending: Internal Medicine | Admitting: Internal Medicine

## 2016-02-15 DIAGNOSIS — I5032 Chronic diastolic (congestive) heart failure: Secondary | ICD-10-CM | POA: Diagnosis not present

## 2016-02-15 NOTE — Telephone Encounter (Signed)
Spoke with pt. She reports adcirca and letaris is causing her to feel "flushed", lethargic, loss of appetite, nauseated, hoarse. She is wanting to know what to do. Please advise RB thanks

## 2016-02-15 NOTE — Telephone Encounter (Signed)
Called spoke with pt. She is aware of recs below. She verbalized understanding and needed nothing further

## 2016-02-15 NOTE — Progress Notes (Signed)
Daily Session Note  Patient Details  Name: [name] MRN: [id number] Date of Birth: 1934/12/21 Referring Provider:        PULMONARY REHAB OTHER RESP ORIENTATION from 02/06/2016 in [place]   Referring Provider  Dr. Haroldine Laws      Encounter Date: 02/15/2016  Check In:     Session Check In - 02/15/16 1045    Check-In   Location [facility]   Staff Present Diane Angelina Pih, MS, EP, [facility], Exercise Physiologist;Asiah Befort Luther Parody, BS, EP, Exercise Physiologist   Supervising physician immediately available to respond to emergencies See telemetry face sheet for immediately available MD   Medication changes reported     No   Fall or balance concerns reported    No   Warm-up and Cool-down Performed as group-led instruction   Resistance Training Performed Yes   VAD Patient? No   Pain Assessment   Currently in Pain? No/denies   Pain Score 0-No pain   Multiple Pain Sites No      Capillary Blood Glucose: No results found for this or any previous visit (from the past 24 hour(s)).   Goals Met:  Independence with exercise equipment Exercise tolerated well No report of cardiac concerns or symptoms Strength training completed today  Goals Unmet:  Not Applicable  Comments: Check out 1045   Dr. Kate Sable is Medical Director for [place] and Pulmonary Rehab.

## 2016-02-15 NOTE — Telephone Encounter (Signed)
Please have her stop the adcirca for now. Continue letaris at current dose. Then have her call us after being off the med for about 10-14 days. We may need to change the letaris also. She should NOT stop everything all at once, or without speaking with [place] first

## 2016-02-16 ENCOUNTER — Other Ambulatory Visit (HOSPITAL_COMMUNITY): Payer: Self-pay | Admitting: *Deleted

## 2016-02-16 DIAGNOSIS — Z6838 Body mass index (BMI) 38.0-38.9, adult: Secondary | ICD-10-CM | POA: Diagnosis not present

## 2016-02-16 DIAGNOSIS — I10 Essential (primary) hypertension: Secondary | ICD-10-CM | POA: Diagnosis not present

## 2016-02-16 DIAGNOSIS — E782 Mixed hyperlipidemia: Secondary | ICD-10-CM | POA: Diagnosis not present

## 2016-02-16 DIAGNOSIS — J449 Chronic obstructive pulmonary disease, unspecified: Secondary | ICD-10-CM | POA: Diagnosis not present

## 2016-02-16 DIAGNOSIS — K219 Gastro-esophageal reflux disease without esophagitis: Secondary | ICD-10-CM | POA: Diagnosis not present

## 2016-02-16 DIAGNOSIS — I4891 Unspecified atrial fibrillation: Secondary | ICD-10-CM | POA: Diagnosis not present

## 2016-02-16 MED ORDER — TORSEMIDE 20 MG PO TABS: 40.0000 mg | ORAL_TABLET | Freq: Every day | ORAL | Status: DC

## 2016-02-20 ENCOUNTER — Encounter (HOSPITAL_COMMUNITY): Payer: Medicare Other

## 2016-02-22 ENCOUNTER — Encounter (HOSPITAL_COMMUNITY)
Admission: RE | Admit: 2016-02-22 | Discharge: 2016-02-22 | Disposition: A | Discharge status: Home / Self Care | Payer: Medicare Other | Source: Ambulatory Visit | Attending: Internal Medicine | Admitting: Internal Medicine

## 2016-02-22 ENCOUNTER — Encounter (HOSPITAL_COMMUNITY): Payer: Medicare Other

## 2016-02-22 DIAGNOSIS — I5032 Chronic diastolic (congestive) heart failure: Secondary | ICD-10-CM | POA: Insufficient documentation

## 2016-02-22 NOTE — Progress Notes (Signed)
Daily Session Note  Patient Details  Name: [name] MRN: [id number] Date of Birth: [birth year] Referring Provider:        PULMONARY REHAB OTHER RESP ORIENTATION from 02/06/2016 in [place]   Referring Provider  Dr. Haroldine Laws      Encounter Date: 02/22/2016  Check In:     Session Check In - 02/22/16 1054    Check-In   Location [facility]   Staff Present Keefer Soulliere Angelina Pih, MS, EP, [facility], Exercise Physiologist;Gregory Luther Parody, BS, EP, Exercise Physiologist;Debra Wynetta Emery, RN, BSN   Supervising physician immediately available to respond to emergencies See telemetry face sheet for immediately available MD   Medication changes reported     No   Fall or balance concerns reported    No   Warm-up and Cool-down Performed as group-led instruction   Resistance Training Performed Yes   VAD Patient? No   Pain Assessment   Currently in Pain? No/denies   Pain Score 0-No pain   Multiple Pain Sites No      Capillary Blood Glucose: No results found for this or any previous visit (from the past 24 hour(s)).      Exercise Prescription Changes - 02/22/16 0700    Exercise Review   Progression No   Response to Exercise   Blood Pressure (Admit) 98/54 mmHg   Blood Pressure (Exercise) 136/72 mmHg   Blood Pressure (Exit) 104/60 mmHg   Heart Rate (Admit) 94 bpm   Heart Rate (Exercise) 83 bpm   Heart Rate (Exit) 72 bpm   Oxygen Saturation (Admit) 90 %   Oxygen Saturation (Exercise) 86 %   Oxygen Saturation (Exit) 91 %   Rating of Perceived Exertion (Exercise) 11   Perceived Dyspnea (Exercise) 11   Comments Patient discontinued treadmill at 10 minutes due to fatigue. Her prescription will be changed to Nustep to prevent this fatigue.   Duration Progress to 30 minutes of continuous aerobic without signs/symptoms of physical distress   Intensity Rest + 20   Progression   Progression Continue to progress workloads to maintain intensity without  signs/symptoms of physical distress.   Resistance Training   Training Prescription No   Weight 1   Reps 8-10   Treadmill   MPH 0.06   Grade 0   Minutes 15   METs 1.49   NuStep   Level 2   Watts 13   Minutes 15   METs 3.67   Arm Ergometer   Level 1.5   Watts 9   Minutes 20   METs 2   Home Exercise Plan   Plans to continue exercise at Home   Frequency Add 3 additional days to program exercise sessions.     Goals Met:  Independence with exercise equipment Exercise tolerated well No report of cardiac concerns or symptoms Strength training completed today  Goals Unmet:  Not Applicable  Comments: Check out 1145   Dr. Kate Sable is Medical Director for [place] and Pulmonary Rehab.

## 2016-02-27 ENCOUNTER — Encounter (HOSPITAL_COMMUNITY)
Admission: RE | Admit: 2016-02-27 | Discharge: 2016-02-27 | Disposition: A | Discharge status: Home / Self Care | Payer: Medicare Other | Source: Ambulatory Visit | Attending: Internal Medicine | Admitting: Internal Medicine

## 2016-02-27 ENCOUNTER — Encounter (HOSPITAL_COMMUNITY): Payer: Medicare Other

## 2016-02-27 DIAGNOSIS — I5032 Chronic diastolic (congestive) heart failure: Secondary | ICD-10-CM

## 2016-02-27 NOTE — Progress Notes (Signed)
Daily Session Note  Patient Details  Name: AMRUTHA AVERA MRN: [id number] Date of Birth: [birth year] Referring Provider:        PULMONARY REHAB OTHER RESP ORIENTATION from 02/06/2016 in [place]   Referring Provider  Dr. Haroldine Laws      Encounter Date: 02/27/2016  Check In:     Session Check In - 02/27/16 1045    Check-In   Location [facility]   Staff Present Suzanne Boron, BS, EP, Exercise Physiologist;Yaqub Arney Wynetta Emery, RN, BSN   Supervising physician immediately available to respond to emergencies See telemetry face sheet for immediately available MD   Medication changes reported     No   Fall or balance concerns reported    No   Warm-up and Cool-down Performed as group-led instruction   Resistance Training Performed Yes   VAD Patient? No   Pain Assessment   Currently in Pain? No/denies   Pain Score 0-No pain   Multiple Pain Sites No      Capillary Blood Glucose: No results found for this or any previous visit (from the past 24 hour(s)).   Goals Met:  Independence with exercise equipment Exercise tolerated well No report of cardiac concerns or symptoms Strength training completed today  Goals Unmet:  Not Applicable  Comments: Check out 1145   Dr. Kate Sable is Medical Director for [place] and Pulmonary Rehab.

## 2016-02-29 ENCOUNTER — Encounter (HOSPITAL_COMMUNITY): Payer: Medicare Other

## 2016-02-29 ENCOUNTER — Encounter (HOSPITAL_COMMUNITY)
Admission: RE | Admit: 2016-02-29 | Discharge: 2016-02-29 | Disposition: A | Discharge status: Home / Self Care | Payer: Medicare Other | Source: Ambulatory Visit | Attending: Internal Medicine | Admitting: Internal Medicine

## 2016-02-29 DIAGNOSIS — I5032 Chronic diastolic (congestive) heart failure: Secondary | ICD-10-CM

## 2016-02-29 NOTE — Progress Notes (Signed)
Daily Session Note  Patient Details  Name: [name] MRN: [id number] Date of Birth: 1934/09/23 Referring Provider:        PULMONARY REHAB OTHER RESP ORIENTATION from 02/06/2016 in [place]   Referring Provider  Dr. Haroldine Laws      Encounter Date: 02/29/2016  Check In:     Session Check In - 02/29/16 1045    Check-In   Location [facility]   Staff Present Diane Angelina Pih, MS, EP, [facility], Exercise Physiologist;Kemet Nijjar Luther Parody, BS, EP, Exercise Physiologist   Supervising physician immediately available to respond to emergencies See telemetry face sheet for immediately available MD   Medication changes reported     No   Fall or balance concerns reported    No   Warm-up and Cool-down Performed as group-led instruction   Resistance Training Performed Yes   VAD Patient? No   Pain Assessment   Currently in Pain? No/denies   Pain Score 0-No pain   Multiple Pain Sites No      Capillary Blood Glucose: No results found for this or any previous visit (from the past 24 hour(s)).   Goals Met:  Independence with exercise equipment Exercise tolerated well No report of cardiac concerns or symptoms Strength training completed today  Goals Unmet:  Not Applicable  Comments: Check out 1145   Dr. Kate Sable is Medical Director for [place] and Pulmonary Rehab.

## 2016-03-05 ENCOUNTER — Encounter (HOSPITAL_COMMUNITY): Payer: Medicare Other

## 2016-03-05 ENCOUNTER — Encounter (HOSPITAL_COMMUNITY)
Admission: RE | Admit: 2016-03-05 | Discharge: 2016-03-05 | Disposition: A | Discharge status: Home / Self Care | Payer: Medicare Other | Source: Ambulatory Visit | Attending: Internal Medicine | Admitting: Internal Medicine

## 2016-03-05 DIAGNOSIS — I5032 Chronic diastolic (congestive) heart failure: Secondary | ICD-10-CM

## 2016-03-05 NOTE — Progress Notes (Signed)
Daily Session Note  Patient Details  Name: VERNETA HAMIDI MRN: [id number] Date of Birth: 1934-11-04 Referring Provider:        PULMONARY REHAB OTHER RESP ORIENTATION from 02/06/2016 in [place]   Referring Provider  Dr. Haroldine Laws      Encounter Date: 03/05/2016  Check In:     Session Check In - 03/05/16 1045    Check-In   Location [facility]   Staff Present Diane Angelina Pih, MS, EP, [facility], Exercise Physiologist;Reegan Bouffard Luther Parody, BS, EP, Exercise Physiologist   Supervising physician immediately available to respond to emergencies See telemetry face sheet for immediately available MD   Medication changes reported     No   Fall or balance concerns reported    No   Warm-up and Cool-down Performed as group-led instruction   Resistance Training Performed Yes   VAD Patient? No   Pain Assessment   Currently in Pain? No/denies   Pain Score 0-No pain   Multiple Pain Sites No      Capillary Blood Glucose: No results found for this or any previous visit (from the past 24 hour(s)).   Goals Met:  Independence with exercise equipment Exercise tolerated well No report of cardiac concerns or symptoms Strength training completed today  Goals Unmet:  Not Applicable  Comments: Check out 1145   Dr. Kate Sable is Medical Director for [place] and Pulmonary Rehab.

## 2016-03-07 ENCOUNTER — Encounter (HOSPITAL_COMMUNITY)
Admission: RE | Admit: 2016-03-07 | Discharge: 2016-03-07 | Disposition: A | Discharge status: Home / Self Care | Payer: Medicare Other | Source: Ambulatory Visit | Attending: Internal Medicine | Admitting: Internal Medicine

## 2016-03-07 ENCOUNTER — Encounter (HOSPITAL_COMMUNITY): Payer: Medicare Other

## 2016-03-07 DIAGNOSIS — I5032 Chronic diastolic (congestive) heart failure: Secondary | ICD-10-CM | POA: Diagnosis not present

## 2016-03-07 NOTE — Progress Notes (Signed)
Daily Session Note  Patient Details  Name: [name] MRN: [id number] Date of Birth: 08/26/1934 Referring Provider:        PULMONARY REHAB OTHER RESP ORIENTATION from 02/06/2016 in [place]   Referring Provider  Dr. Haroldine Laws      Encounter Date: 03/07/2016  Check In:     Session Check In - 03/07/16 1045    Check-In   Location [facility]   Staff Present Diane Angelina Pih, MS, EP, [facility], Exercise Physiologist;Noelle Sease Luther Parody, BS, EP, Exercise Physiologist;Debra Wynetta Emery, RN, BSN   Supervising physician immediately available to respond to emergencies See telemetry face sheet for immediately available MD   Medication changes reported     No   Fall or balance concerns reported    No   Warm-up and Cool-down Performed as group-led instruction   Resistance Training Performed Yes   VAD Patient? No   Pain Assessment   Currently in Pain? No/denies   Pain Score 0-No pain   Multiple Pain Sites No      Capillary Blood Glucose: No results found for this or any previous visit (from the past 24 hour(s)).   Goals Met:  Independence with exercise equipment Exercise tolerated well No report of cardiac concerns or symptoms Strength training completed today  Goals Unmet:  Not Applicable  Comments: Check out 1145   Dr. Kate Sable is Medical Director for [place] and Pulmonary Rehab.

## 2016-03-12 ENCOUNTER — Encounter (HOSPITAL_COMMUNITY): Payer: Medicare Other

## 2016-03-12 ENCOUNTER — Encounter (HOSPITAL_COMMUNITY)
Admission: RE | Admit: 2016-03-12 | Discharge: 2016-03-12 | Disposition: A | Discharge status: Home / Self Care | Payer: Medicare Other | Source: Ambulatory Visit | Attending: Internal Medicine | Admitting: Internal Medicine

## 2016-03-12 DIAGNOSIS — I5032 Chronic diastolic (congestive) heart failure: Secondary | ICD-10-CM

## 2016-03-12 NOTE — Progress Notes (Signed)
Daily Session Note  Patient Details  Name: [name] MRN: [id number] Date of Birth: 05-Jan-1935 Referring Provider:   [place] from 02/06/2016 in [place]  Referring Provider  Dr. Haroldine Laws      Encounter Date: 03/12/2016  Check In:     Session Check In - 03/12/16 1045      Check-In   Location [facility]   Staff Present Diane Angelina Pih, MS, EP, [facility], Exercise Physiologist;Avyn Aden Luther Parody, BS, EP, Exercise Physiologist   Supervising physician immediately available to respond to emergencies See telemetry face sheet for immediately available MD   Medication changes reported     No   Fall or balance concerns reported    No   Warm-up and Cool-down Performed as group-led instruction   Resistance Training Performed Yes   VAD Patient? No     Pain Assessment   Currently in Pain? No/denies   Pain Score 0-No pain   Multiple Pain Sites No      Capillary Blood Glucose: No results found for this or any previous visit (from the past 24 hour(s)).   Goals Met:  Independence with exercise equipment Exercise tolerated well No report of cardiac concerns or symptoms Strength training completed today  Goals Unmet:  Not Applicable  Comments: Check out 1145   Dr. Kate Sable is Medical Director for [place] and Pulmonary Rehab.

## 2016-03-14 ENCOUNTER — Encounter (HOSPITAL_COMMUNITY): Payer: Medicare Other

## 2016-03-14 ENCOUNTER — Encounter (HOSPITAL_COMMUNITY)
Admission: RE | Admit: 2016-03-14 | Discharge: 2016-03-14 | Disposition: A | Discharge status: Home / Self Care | Payer: Medicare Other | Source: Ambulatory Visit | Attending: Internal Medicine | Admitting: Internal Medicine

## 2016-03-14 DIAGNOSIS — I5032 Chronic diastolic (congestive) heart failure: Secondary | ICD-10-CM | POA: Diagnosis not present

## 2016-03-14 NOTE — Progress Notes (Signed)
Pulmonary Individual Treatment Plan  Patient Details  Name: [name] MRN: [id number] Date of Birth: October 09, 1934 Referring Provider:   Flowsheet Row PULMONARY REHAB OTHER RESP ORIENTATION from 02/06/2016 in [place]  Referring Provider  Dr. Haroldine Laws      Initial Encounter Date:  Flowsheet Row PULMONARY REHAB OTHER RESP ORIENTATION from 02/06/2016 in [place]  Date  02/06/16  Referring Provider  Dr. Haroldine Laws      Visit Diagnosis: Heart failure, diastolic, chronic (HCC)  Diastolic CHF, chronic ([place])  Patient's Home Medications on Admission:   Current Outpatient Prescriptions:  .  ALPRAZolam (XANAX) 0.5 MG tablet, Take 0.5 mg by mouth at bedtime. , Disp: , Rfl:  .  alum hydroxide-mag trisilicate (GAVISCON) [id number] MG CHEW chewable tablet, Chew 1 tablet by mouth 3 (three) times daily as needed for indigestion or heartburn., Disp: , Rfl:  .  ambrisentan (LETAIRIS) 5 MG tablet, Take 1 tablet (5 mg total) by mouth daily., Disp: 20 tablet, Rfl: 0 .  apixaban (ELIQUIS) 5 MG TABS tablet, Take 1 tablet (5 mg total) by mouth 2 (two) times daily., Disp: 60 tablet, Rfl: 3 .  atorvastatin (LIPITOR) 80 MG tablet, TAKE 1/2 TABLET BY MOUTH DAILY, Disp: 45 tablet, Rfl: 2 .  AZOPT 1 % ophthalmic suspension, Place 1 drop into both eyes 3 (three) times daily. , Disp: , Rfl:  .  brimonidine (ALPHAGAN) 0.2 % ophthalmic solution, Place 1 drop into both eyes 2 (two) times daily. , Disp: , Rfl:  .  Coenzyme Q10 (COQ10) 200 MG CAPS, Take 200 mg by mouth daily., Disp: , Rfl:  .  colchicine 0.6 MG tablet, Take 0.6 mg by mouth daily., Disp: , Rfl:  .  fluticasone (FLONASE) 50 MCG/ACT nasal spray, Place 2 sprays into both nostrils daily. , Disp: , Rfl:  .  HYDROcodone-acetaminophen (NORCO/VICODIN) 5-325 MG tablet, Take 1 tablet by mouth 2 (two) times daily as needed for moderate pain. , Disp: , Rfl:  .  latanoprost (XALATAN) 0.005 % ophthalmic solution,  Place 1 drop into the right eye at bedtime. , Disp: , Rfl:  .  lidocaine (XYLOCAINE) 5 % ointment, Apply 1 application topically daily as needed (knee pain). , Disp: , Rfl:  .  losartan (COZAAR) 100 MG tablet, Take 100 mg by mouth every evening., Disp: , Rfl:  .  nitroGLYCERIN (NITROSTAT) 0.4 MG SL tablet, Place 0.4 mg under the tongue every 5 (five) minutes as needed for chest pain (x 3 tablets daily). Reported on 12/28/2015, Disp: , Rfl:  .  ondansetron (ZOFRAN ODT) 8 MG disintegrating tablet, Take 1 tablet (8 mg total) by mouth every 8 (eight) hours as needed for nausea or vomiting., Disp: 10 tablet, Rfl: 0 .  pantoprazole (PROTONIX) 20 MG tablet, Take 20 mg by mouth daily. Reported on 02/10/2016, Disp: , Rfl:  .  potassium chloride SA (K-DUR,KLOR-CON) 20 MEQ tablet, Take 1 tablet (20 mEq total) by mouth daily., Disp: 30 tablet, Rfl: 3 .  Probiotic Product (PROBIOTIC PO), Take 1 tablet by mouth 2 (two) times daily., Disp: , Rfl:  .  spironolactone (ALDACTONE) 25 MG tablet, Take 0.5 tablets (12.5 mg total) by mouth daily., Disp: 15 tablet, Rfl: 3 .  tadalafil, PAH, (ADCIRCA) 20 MG tablet, Take 2 tablets (40 mg total) by mouth daily., Disp: 60 tablet, Rfl: 5 .  timolol (TIMOPTIC) 0.5 % ophthalmic solution, Place 1 drop into both eyes 2 (two) times daily., Disp: , Rfl:  .  torsemide (  DEMADEX) 20 MG tablet, Take 2 tablets (40 mg total) by mouth daily., Disp: 180 tablet, Rfl: 0  Past Medical History: Past Medical History:  Diagnosis Date  . A-fib ([place])   . Arthritis    "knees" (11/17/2015)  . CAD (coronary artery disease)    BMS to LAD 07/2005  . Chronic atrial fibrillation ([place])    Since 2011  . Degenerative arthritis of right knee   . Essential hypertension   . GERD (gastroesophageal reflux disease)   . Hyperlipidemia   . Morbid obesity ([place])   . Myocardial infarction () 07/2005  . On home oxygen therapy    "2.5L at night" (11/17/2015)  . Psoriasis   . Pulmonary hypertension (HCC)     Mixed, PASP 67 mmHg at [facility] 2013  . Right ventricular dysfunction   . Tricuspid regurgitation    Severe    Tobacco Use: History  Smoking Status  . Former Smoker  . Packs/day: 0.30  . Years: 0.50  . Types: Cigarettes  . Quit date: 08/19/1986  Smokeless Tobacco  . Never Used    Comment: pt only smoked for only a few months.    Labs: Recent Review Flowsheet Data    Labs for ITP Cardiac and Pulmonary Rehab Latest Ref Rng & Units 08/04/2012 08/04/2012 08/04/2012 12/07/2015 12/07/2015   Cholestrol 0 - 200 mg/dL - - - - -   LDLCALC 0 - 99 mg/dL - - - - -   HDL >39.00 mg/dL - - - - -   Trlycerides 0.0 - 149.0 mg/dL - - - - -   PHART 7.350 - 7.450 7.379 - - - -   PCO2ART 35.0 - 45.0 mmHg 39.6 - - - -   HCO3 20.0 - 24.0 mEq/L 23.4 24.9(H) 24.2(H) 29.5(H) 28.7(H)   TCO2 0 - 100 mmol/L 25 26 25 31 30    ACIDBASEDEF 0.0 - 2.0 mmol/L 2.0 - 1.0 - -   O2SAT % 94.0 61.0 61.0 66.0 65.0      Capillary Blood Glucose: Lab Results  Component Value Date   GLUCAP 97 04/09/2014     ADL UCSD:     Pulmonary Assessment Scores    Row Name 02/06/16 1712         ADL UCSD   ADL Phase Entry     SOB Score total 50     Rest 0     Walk 6     Stairs 3     Bath 2     Dress 2     Shop 3       CAT Score   CAT Score 16       mMRC Score   mMRC Score 2        Pulmonary Function Assessment:     Pulmonary Function Assessment - 02/06/16 1709      Pulmonary Function Tests   FVC% 46 %   FEV1% 47 %   FEV1/FVC Ratio 74   RV% 98 %   DLCO% 27 %     Initial Spirometry Results   FVC% 1.31 %   FEV1% 0.93 %   FEV1/FVC Ratio 71     Post Bronchodilator Spirometry Results   FVC% 1.26 %   FEV1% 0.94 %   FEV1/FVC Ratio 74     Breath   Shortness of Breath Yes      Exercise Target Goals:    Exercise Program Goal: Individual exercise prescription set with THRR, safety & activity barriers. Participant demonstrates ability to  understand and report RPE using BORG scale, to self-measure  pulse accurately, and to acknowledge the importance of the exercise prescription.  Exercise Prescription Goal: Starting with aerobic activity 30 plus minutes a day, 3 days per week for initial exercise prescription. Provide home exercise prescription and guidelines that participant acknowledges understanding prior to discharge.  Activity Barriers & Risk Stratification:     Activity Barriers & Cardiac Risk Stratification - 02/06/16 1651      Activity Barriers & Cardiac Risk Stratification   Activity Barriers Back Problems;Deconditioning  Left knee problems   Cardiac Risk Stratification High      6 Minute Walk:     6 Minute Walk    Row Name 02/07/16 1400         6 Minute Walk   Phase Initial     Distance 735 feet     Walk Time 6 minutes     # of Rest Breaks 0     MPH 1.39     METS 2.06     RPE 13     Perceived Dyspnea  13     VO2 Peak 3.38     Symptoms No     Resting HR 62 bpm     Resting BP 110/60     Max Ex. HR 103 bpm     Max Ex. BP 144/62     2 Minute Post BP 122/60        Initial Exercise Prescription:     Initial Exercise Prescription - 02/07/16 1400      Date of Initial Exercise RX and Referring Provider   Date 02/06/16   Referring Provider Dr. Haroldine Laws     Treadmill   MPH 0.6   Grade 0   Minutes 15   METs 1.4     NuStep   Level 2   Watts 11   Minutes 15   METs 1.8     Arm Ergometer   Level 1.5   Watts 15   Minutes 15   METs 2.1     Prescription Details   Frequency (times per week) 2   Duration Progress to 30 minutes of continuous aerobic without signs/symptoms of physical distress     Intensity   THRR REST +  20   THRR 40-80% of Max Heartrate [phone]   Ratings of Perceived Exertion 11-13   Perceived Dyspnea 0-4     Progression   Progression Continue to progress workloads to maintain intensity without signs/symptoms of physical distress.     Resistance Training   Training Prescription Yes   Weight 1   Reps 10-12       Perform Capillary Blood Glucose checks as needed.  Exercise Prescription Changes:      Exercise Prescription Changes    Row Name 02/13/16 1200 02/13/16 1700 02/22/16 0700 03/05/16 1200 03/13/16 0900     Exercise Review   Progression No No No No Yes     Response to Exercise   Blood Pressure (Admit) 98/50 98/50 98/54  96/52 100/52   Blood Pressure (Exercise) 122/60 122/60 136/72 124/60 138/64   Blood Pressure (Exit) 92/50 92/50 104/60 104/58 104/54   Heart Rate (Admit) 54 bpm 54 bpm 94 bpm 57 bpm 71 bpm   Heart Rate (Exercise) 60 bpm 60 bpm 83 bpm 74 bpm 74 bpm   Heart Rate (Exit) 58 bpm 58 bpm 72 bpm 66 bpm 80 bpm   Oxygen Saturation (Admit) 90 % 90 % 90 % 95 % 97 %  Oxygen Saturation (Exercise) 93 % 93 % 86 % 90 % 90 %   Oxygen Saturation (Exit) 90 % 90 % 91 % 90 % 89 %   Rating of Perceived Exertion (Exercise) 13 13 11 11 15    Perceived Dyspnea (Exercise) 11 11 11 11 15    Comments Patient discontinued treadmill at 10 minutes due to fatigue. Her prescription will be changed to Nustep to prevent this fatigue. Patient discontinued treadmill at 10 minutes due to fatigue. Her prescription will be changed to Nustep to prevent this fatigue. Patient discontinued treadmill at 10 minutes due to fatigue. Her prescription will be changed to Nustep to prevent this fatigue. Patient discontinued treadmill at 10 minutes due to fatigue. Her prescription will be changed to Nustep to prevent this fatigue.  -   Duration Progress to 30 minutes of continuous aerobic without signs/symptoms of physical distress Progress to 30 minutes of continuous aerobic without signs/symptoms of physical distress Progress to 30 minutes of continuous aerobic without signs/symptoms of physical distress Progress to 30 minutes of continuous aerobic without signs/symptoms of physical distress Progress to 30 minutes of continuous aerobic without signs/symptoms of physical distress   Intensity Rest + 20 Rest + 20 Rest + 20 Rest  + 20 Rest + 20     Progression   Progression Continue to progress workloads to maintain intensity without signs/symptoms of physical distress. Continue to progress workloads to maintain intensity without signs/symptoms of physical distress. Continue to progress workloads to maintain intensity without signs/symptoms of physical distress. Continue to progress workloads to maintain intensity without signs/symptoms of physical distress. Continue to progress workloads to maintain intensity without signs/symptoms of physical distress.     Resistance Training   Training Prescription No No No No Yes   Weight 1 1 1 2 2    Reps 10-12 8-10 8-10 8-10 10-12     Treadmill   MPH  - 0.06 0.06 0.06  -   Grade  - 0 0 0  -   Minutes  - 15 15 15   -   METs  - 1.49 1.49 1.49  -     NuStep   Level 2  - 2 2 3    Watts 11  - 13 10 13    Minutes 15  - 15 15 15    METs 1.8  - 3.67 3.66 3.65     Arm Ergometer   Level 1.5 1.5 1.5 2 2.2   Watts 12 12 9 22 12    Minutes 20 20 20 20 20    METs 1.8 1.8 2 2.6 1.9     Home Exercise Plan   Plans to continue exercise at [place]   Frequency Add 3 additional days to program exercise sessions. Add 3 additional days to program exercise sessions. Add 3 additional days to program exercise sessions. Add 3 additional days to program exercise sessions. Add 2 additional days to program exercise sessions.      Exercise Comments:      Exercise Comments    Row Name 03/13/16 0907           Exercise Comments [place] is proggressing appropriately           Discharge Exercise Prescription (Final Exercise Prescription Changes):     Exercise Prescription Changes - 03/13/16 0900      Exercise Review   Progression Yes     Response to Exercise   Blood Pressure (Admit) 100/52   Blood Pressure (Exercise) 138/64   Blood Pressure (  Exit) 104/54   Heart Rate (Admit) 71 bpm   Heart Rate (Exercise) 74 bpm   Heart Rate (Exit) 80 bpm   Oxygen Saturation (Admit)  97 %   Oxygen Saturation (Exercise) 90 %   Oxygen Saturation (Exit) 89 %   Rating of Perceived Exertion (Exercise) 15   Perceived Dyspnea (Exercise) 15   Duration Progress to 30 minutes of continuous aerobic without signs/symptoms of physical distress   Intensity Rest + 20     Progression   Progression Continue to progress workloads to maintain intensity without signs/symptoms of physical distress.     Resistance Training   Training Prescription Yes   Weight 2   Reps 10-12     NuStep   Level 3   Watts 13   Minutes 15   METs 3.65     Arm Ergometer   Level 2.2   Watts 12   Minutes 20   METs 1.9     Home Exercise Plan   Plans to continue exercise at Home   Frequency Add 2 additional days to program exercise sessions.      Nutrition:  Target Goals: Understanding of nutrition guidelines, daily intake of sodium 1500mg , cholesterol 200mg , calories 30% from fat and 7% or less from saturated fats, daily to have 5 or more servings of fruits and vegetables.  Biometrics:     Pre Biometrics - 02/07/16 1404      Pre Biometrics   Height 5\' 3"  (1.6 m)   Weight 217 lb 9.5 oz (98.7 kg)   Waist Circumference 50 inches   Hip Circumference 47.5 inches   Waist to Hip Ratio 1.05 %   BMI (Calculated) 38.6   Triceps Skinfold 21 mm   % Body Fat 29 %   Grip Strength 40.3 kg   Flexibility 0 in   Single Leg Stand 0 seconds       Nutrition Therapy Plan and Nutrition Goals:   Nutrition Discharge: Rate Your Plate Scores:     Nutrition Assessments - 02/06/16 1715      Rate Your Plate Scores   Pre Score 64      Psychosocial: Target Goals: Acknowledge presence or absence of depression, maximize coping skills, provide positive support system. Participant is able to verbalize types and ability to use techniques and skills needed for reducing stress and depression.  Initial Review & Psychosocial Screening:     Initial Psych Review & Screening - 02/06/16 1719      Initial  Review   Current issues with --  No issues     Family Dynamics   Good Support System? Yes     Barriers   Psychosocial barriers to participate in program There are no identifiable barriers or psychosocial needs.     Screening Interventions   Interventions Encouraged to exercise      Quality of Life Scores:     Quality of Life - 03/14/16 1244      Quality of Life Scores   Health/Function Pre 21 %   Socioeconomic Pre 29.64 %   Psych/Spiritual Pre 29 %   Family Pre 30 %   GLOBAL Pre 25.65 %      PHQ-9: Recent Review Flowsheet Data    Depression screen [facility] 2/9 02/06/2016   Decreased Interest 0   Down, Depressed, Hopeless 0   PHQ - 2 Score 0   Altered sleeping 0   Tired, decreased energy 0   Change in appetite 0   Feeling bad or failure  about yourself  0   Trouble concentrating 0   Moving slowly or fidgety/restless 0   Suicidal thoughts 0   PHQ-9 Score 0   Difficult doing work/chores Somewhat difficult      Psychosocial Evaluation and Intervention:     Psychosocial Evaluation - 02/13/16 1800      Psychosocial Evaluation & Interventions   Interventions Encouraged to exercise with the program and follow exercise prescription;Relaxation education   Continued Psychosocial Services Needed No      Psychosocial Re-Evaluation:     Psychosocial Re-Evaluation    [place] Name 03/14/16 1245             Psychosocial Re-Evaluation   Comments Patient has no psychosocial issues. Her PHQ-9 score is 0 and her QOL score is 25.65. Patient does not need counseling.        Continued Psychosocial Services Needed No          Education: Education Goals: Education classes will be provided on a weekly basis, covering required topics. Participant will state understanding/return demonstration of topics presented.  Learning Barriers/Preferences:     Learning Barriers/Preferences - 02/06/16 1652      Learning Barriers/Preferences   Learning Barriers None   Learning  Preferences Written Material      Education Topics: How Lungs Work and Diseases: - Discuss the anatomy of the lungs and diseases that can affect the lungs, such as COPD.   Exercise: -Discuss the importance of exercise, FITT principles of exercise, normal and abnormal responses to exercise, and how to exercise safely.   Environmental Irritants: -Discuss types of environmental irritants and how to limit exposure to environmental irritants.   Meds/Inhalers and oxygen: - Discuss respiratory medications, definition of an inhaler and oxygen, and the proper way to use an inhaler and oxygen.   Energy Saving Techniques: - Discuss methods to conserve energy and decrease shortness of breath when performing activities of daily living.    Bronchial Hygiene / Breathing Techniques: - Discuss breathing mechanics, pursed-lip breathing technique,  proper posture, effective ways to clear airways, and other functional breathing techniques   Cleaning Equipment: - Provides group verbal and written instruction about the health risks of elevated stress, cause of high stress, and healthy ways to reduce stress.   Nutrition I: Fats: - Discuss the types of cholesterol, what cholesterol does to the body, and how cholesterol levels can be controlled.   Nutrition II: Labels: -Discuss the different components of food labels and how to read food labels. Flowsheet Row PULMONARY REHAB OTHER RESPIRATORY from 03/07/2016 in [place]  Date  02/15/16  Educator  Russella Dar  Instruction Review Code  2- meets goals/outcomes      Respiratory Infections: - Discuss the signs and symptoms of respiratory infections, ways to prevent respiratory infections, and the importance of seeking medical treatment when having a respiratory infection. Flowsheet Row PULMONARY REHAB OTHER RESPIRATORY from 03/07/2016 in [place]  Date  02/22/16  Educator  DC  Instruction Review  Code  2- meets goals/outcomes      Stress I: Signs and Symptoms: - Discuss the causes of stress, how stress may lead to anxiety and depression, and ways to limit stress. Flowsheet Row PULMONARY REHAB OTHER RESPIRATORY from 03/07/2016 in [place]  Date  02/29/16  Educator  DC  Instruction Review Code  2- meets goals/outcomes      Stress II: Relaxation: -Discuss relaxation techniques to limit stress. Flowsheet Row PULMONARY REHAB OTHER RESPIRATORY from  03/07/2016 in [place]  Date  03/07/16  Educator  DC  Instruction Review Code  2- meets goals/outcomes      Oxygen for Home/Travel: - Discuss how to prepare for travel when on oxygen and proper ways to transport and store oxygen to ensure safety.   Knowledge Questionnaire Score:     Knowledge Questionnaire Score - 02/06/16 1708      Knowledge Questionnaire Score   Pre Score 11/14      Core Components/Risk Factors/Patient Goals at Admission:     Personal Goals and Risk Factors at Admission - 02/06/16 1716      Core Components/Risk Factors/Patient Goals on Admission    Weight Management Weight Loss   Increase Strength and Stamina Yes   Intervention Provide advice, education, support and counseling about physical activity/exercise needs.;Develop an individualized exercise prescription for aerobic and resistive training based on initial evaluation findings, risk stratification, comorbidities and participant's personal goals.   Expected Outcomes Achievement of increased cardiorespiratory fitness and enhanced flexibility, muscular endurance and strength shown through measurements of functional capacity and personal statement of participant.   Improve shortness of breath with ADL's Yes   Intervention Provide education, individualized exercise plan and daily activity instruction to help decrease symptoms of SOB with activities of daily living.   Expected Outcomes Short Term: Achieves  a reduction of symptoms when performing activities of daily living.   Develop more efficient breathing techniques such as purse lipped breathing and diaphragmatic breathing; and practicing self-pacing with activity Yes   Intervention Provide education, demonstration and support about specific breathing techniuqes utilized for more efficient breathing. Include techniques such as pursed lipped breathing, diaphragmatic breathing and self-pacing activity.   Expected Outcomes Short Term: Participant will be able to demonstrate and use breathing techniques as needed throughout daily activities.   Heart Failure Yes   Intervention Provide a combined exercise and nutrition program that is supplemented with education, support and counseling about heart failure. Directed toward relieving symptoms such as shortness of breath, decreased exercise tolerance, and extremity edema.   Expected Outcomes Improve functional capacity of life;Short term: Attendance in program 2-3 days a week with increased exercise capacity. Reported lower sodium intake. Reported increased fruit and vegetable intake. Reports medication compliance.;Short term: Daily weights obtained and reported for increase. Utilizing diuretic protocols set by physician.;Long term: Adoption of self-care skills and reduction of barriers for early signs and symptoms recognition and intervention leading to self-care maintenance.   Personal Goal Other Yes   Personal Goal Breath better, understand her donction better, feel better overall and continue to feel better.    Intervention Attend 2xweek in [place] and supplement with 2 more days at home of exercise.    Expected Outcomes To feel stronger and breath better      Core Components/Risk Factors/Patient Goals Review:      Goals and Risk Factor Review    Row Name 02/13/16 1759 03/14/16 1022           Core Components/Risk Factors/Patient Goals Review   Personal Goals Review Weight Management/Obesity;Increase  Strength and Stamina;Develop more efficient breathing techniques such as purse lipped breathing and diaphragmatic breathing and practicing self-pacing with activity. Improve shortness of breath with ADL's;Increase Strength and Stamina;Other  Get better overall.       Review  - Patient's SOB is improving overall and she feels she is benefiting from the program.       Expected Outcomes Get better overall, stay better, breath better Continue to improve  SOB and increase strength and stamina.          Core Components/Risk Factors/Patient Goals at Discharge (Final Review):      Goals and Risk Factor Review - 03/14/16 1022      Core Components/Risk Factors/Patient Goals Review   Personal Goals Review Improve shortness of breath with ADL's;Increase Strength and Stamina;Other  Get better overall.    Review Patient's SOB is improving overall and she feels she is benefiting from the program.    Expected Outcomes Continue to improve SOB and increase strength and stamina.       ITP Comments:   Comments: Patient is continuing to progress in meeting her personal goals after 9 sessions. Recommend continued exercises, lifestyle modifications, education and utilization of breathing exercises to increase stamina and strength and decrease SOB with exertion.

## 2016-03-14 NOTE — Progress Notes (Signed)
Daily Session Note  Patient Details  Name: [name] MRN: [id number] Date of Birth: March 08, 1935 Referring Provider:   Flowsheet Row PULMONARY REHAB OTHER RESP ORIENTATION from 02/06/2016 in [place]  Referring Provider  Dr. Haroldine Laws      Encounter Date: 03/14/2016  Check In:     Session Check In - 03/14/16 1045      Check-In   Location [facility]   Staff Present Diane Angelina Pih, MS, EP, CHC, Exercise Physiologist;Haytham Maher Luther Parody, BS, EP, Exercise Physiologist;Debra Wynetta Emery, RN, BSN   Supervising physician immediately available to respond to emergencies See telemetry face sheet for immediately available MD   Medication changes reported     No   Fall or balance concerns reported    No   Warm-up and Cool-down Performed as group-led instruction   Resistance Training Performed Yes   VAD Patient? No     Pain Assessment   Currently in Pain? No/denies   Pain Score 0-No pain   Multiple Pain Sites No      Capillary Blood Glucose: No results found for this or any previous visit (from the past 24 hour(s)).   Goals Met:  Independence with exercise equipment Exercise tolerated well No report of cardiac concerns or symptoms Strength training completed today  Goals Unmet:  Not Applicable  Comments: Check out 1145   Dr. Kate Sable is Medical Director for [place] and Pulmonary Rehab.

## 2016-03-19 ENCOUNTER — Encounter (HOSPITAL_COMMUNITY)
Admission: RE | Admit: 2016-03-19 | Discharge: 2016-03-19 | Disposition: A | Discharge status: Home / Self Care | Payer: Medicare Other | Source: Ambulatory Visit | Attending: Internal Medicine | Admitting: Internal Medicine

## 2016-03-19 ENCOUNTER — Encounter (HOSPITAL_COMMUNITY): Payer: Medicare Other

## 2016-03-19 DIAGNOSIS — I5032 Chronic diastolic (congestive) heart failure: Secondary | ICD-10-CM | POA: Diagnosis not present

## 2016-03-19 NOTE — Progress Notes (Signed)
Daily Session Note  Patient Details  Name: [name] MRN: [id number] Date of Birth: 1935-02-02 Referring Provider:   [place] from 02/06/2016 in [place]  Referring Provider  Dr. Haroldine Laws      Encounter Date: 03/19/2016  Check In:     Session Check In - 03/19/16 1045      Check-In   Location [facility]   Staff Present Diane Angelina Pih, MS, EP, [facility], Exercise Physiologist;Debra Wynetta Emery, RN, BSN;Heavan Francom, BS, EP, Exercise Physiologist   Supervising physician immediately available to respond to emergencies See telemetry face sheet for immediately available MD   Medication changes reported     No   Fall or balance concerns reported    No   Warm-up and Cool-down Performed as group-led instruction   Resistance Training Performed Yes   VAD Patient? No     Pain Assessment   Currently in Pain? No/denies   Pain Score 0-No pain   Multiple Pain Sites No      Capillary Blood Glucose: No results found for this or any previous visit (from the past 24 hour(s)).   Goals Met:  Independence with exercise equipment Exercise tolerated well No report of cardiac concerns or symptoms Strength training completed today  Goals Unmet:  Not Applicable  Comments: Check out 1145   Dr. Kate Sable is Medical Director for [place].

## 2016-03-21 ENCOUNTER — Encounter (HOSPITAL_COMMUNITY): Payer: Medicare Other

## 2016-03-21 ENCOUNTER — Encounter (HOSPITAL_COMMUNITY)
Admission: RE | Admit: 2016-03-21 | Discharge: 2016-03-21 | Disposition: A | Discharge status: Home / Self Care | Payer: Medicare Other | Source: Ambulatory Visit | Attending: Internal Medicine | Admitting: Internal Medicine

## 2016-03-21 DIAGNOSIS — I5032 Chronic diastolic (congestive) heart failure: Secondary | ICD-10-CM

## 2016-03-21 NOTE — Progress Notes (Signed)
Daily Session Note  Patient Details  Name: [name] MRN: [id number] Date of Birth: 10/06/1934 Referring Provider:   Flowsheet Row PULMONARY REHAB OTHER RESP ORIENTATION from 02/06/2016 in [place]  Referring Provider  Dr. Haroldine Laws      Encounter Date: 03/21/2016  Check In:     Session Check In - 03/21/16 1045      Check-In   Location [facility]   Staff Present Diane Angelina Pih, MS, EP, [facility], Exercise Physiologist;Debra Wynetta Emery, RN, BSN;Liyat Faulkenberry, BS, EP, Exercise Physiologist   Supervising physician immediately available to respond to emergencies See telemetry face sheet for immediately available MD   Medication changes reported     No   Fall or balance concerns reported    No   Warm-up and Cool-down Performed as group-led instruction   Resistance Training Performed Yes   VAD Patient? No     Pain Assessment   Currently in Pain? No/denies   Pain Score 0-No pain   Multiple Pain Sites No      Capillary Blood Glucose: No results found for this or any previous visit (from the past 24 hour(s)).   Goals Met:  Independence with exercise equipment Exercise tolerated well No report of cardiac concerns or symptoms Strength training completed today  Goals Unmet:  Not Applicable  Comments: Check out 1145   Dr. Kate Sable is Medical Director for [place] and Pulmonary Rehab.

## 2016-03-26 ENCOUNTER — Encounter (HOSPITAL_COMMUNITY): Payer: Medicare Other

## 2016-03-26 ENCOUNTER — Encounter (HOSPITAL_COMMUNITY)
Admission: RE | Admit: 2016-03-26 | Discharge: 2016-03-26 | Disposition: A | Discharge status: Home / Self Care | Payer: Medicare Other | Source: Ambulatory Visit | Attending: Internal Medicine | Admitting: Internal Medicine

## 2016-03-26 DIAGNOSIS — I5032 Chronic diastolic (congestive) heart failure: Secondary | ICD-10-CM

## 2016-03-26 NOTE — Progress Notes (Signed)
Daily Session Note  Patient Details  Name: ZANASIA HICKSON MRN: [id number] Date of Birth: [birth year] Referring Provider:   [place] from 02/06/2016 in [place]  Referring Provider  Dr. Haroldine Laws      Encounter Date: 03/26/2016  Check In:     Session Check In - 03/26/16 1045      Check-In   Location [facility]   Staff Present Diane Angelina Pih, MS, EP, [facility], Exercise Physiologist;Debra Wynetta Emery, RN, BSN;Christianne Zacher, BS, EP, Exercise Physiologist   Supervising physician immediately available to respond to emergencies See telemetry face sheet for immediately available MD   Medication changes reported     No   Fall or balance concerns reported    No   Warm-up and Cool-down Performed as group-led instruction   Resistance Training Performed Yes   VAD Patient? No     Pain Assessment   Currently in Pain? No/denies   Pain Score 0-No pain   Multiple Pain Sites No      Capillary Blood Glucose: No results found for this or any previous visit (from the past 24 hour(s)).   Goals Met:  Independence with exercise equipment Exercise tolerated well No report of cardiac concerns or symptoms Strength training completed today  Goals Unmet:  Not Applicable  Comments: Check out 1145   Dr. Kate Sable is Medical Director for [place] and Pulmonary Rehab.

## 2016-03-28 ENCOUNTER — Encounter (HOSPITAL_COMMUNITY)
Admission: RE | Admit: 2016-03-28 | Discharge: 2016-03-28 | Disposition: A | Discharge status: Home / Self Care | Payer: Medicare Other | Source: Ambulatory Visit | Attending: Internal Medicine | Admitting: Internal Medicine

## 2016-03-28 ENCOUNTER — Encounter (HOSPITAL_COMMUNITY): Payer: Medicare Other

## 2016-03-28 DIAGNOSIS — I5032 Chronic diastolic (congestive) heart failure: Secondary | ICD-10-CM

## 2016-03-28 NOTE — Progress Notes (Signed)
Daily Session Note  Patient Details  Name: [name] MRN: [id number] Date of Birth: 03/10/1935 Referring Provider:   [place] from 02/06/2016 in [place]  Referring Provider  Dr. Haroldine Laws      Encounter Date: 03/28/2016  Check In:     Session Check In - 03/28/16 1045      Check-In   Location [facility]   Staff Present Diane Angelina Pih, MS, EP, [facility], Exercise Physiologist;Debra Wynetta Emery, RN, BSN;Jacoba Cherney, BS, EP, Exercise Physiologist   Supervising physician immediately available to respond to emergencies See telemetry face sheet for immediately available MD   Medication changes reported     No   Fall or balance concerns reported    No   Warm-up and Cool-down Performed as group-led instruction   Resistance Training Performed Yes   VAD Patient? No     Pain Assessment   Currently in Pain? No/denies   Pain Score 0-No pain   Multiple Pain Sites No      Capillary Blood Glucose: No results found for this or any previous visit (from the past 24 hour(s)).   Goals Met:  Independence with exercise equipment Exercise tolerated well No report of cardiac concerns or symptoms Strength training completed today  Goals Unmet:  Not Applicable  Comments: Check out 1145   Dr. Kate Sable is Medical Director for [place] and Pulmonary Rehab.

## 2016-04-02 ENCOUNTER — Encounter (HOSPITAL_COMMUNITY)
Admission: RE | Admit: 2016-04-02 | Discharge: 2016-04-02 | Disposition: A | Discharge status: Home / Self Care | Payer: Medicare Other | Source: Ambulatory Visit | Attending: Internal Medicine | Admitting: Internal Medicine

## 2016-04-02 ENCOUNTER — Encounter (HOSPITAL_COMMUNITY): Payer: Medicare Other

## 2016-04-02 DIAGNOSIS — I5032 Chronic diastolic (congestive) heart failure: Secondary | ICD-10-CM | POA: Diagnosis not present

## 2016-04-02 NOTE — Progress Notes (Signed)
Daily Session Note  Patient Details  Name: [name] MRN: [id number] Date of Birth: 01-26-1935 Referring Provider:   [place] from 02/06/2016 in [place]  Referring Provider  Dr. Haroldine Laws      Encounter Date: 04/02/2016  Check In:     Session Check In - 04/02/16 1104      Check-In   Location [facility]   Staff Present Diane Angelina Pih, MS, EP, [facility], Exercise Physiologist;Brisa Auth Luther Parody, BS, EP, Exercise Physiologist   Supervising physician immediately available to respond to emergencies See telemetry face sheet for immediately available MD   Medication changes reported     No   Fall or balance concerns reported    No   Warm-up and Cool-down Performed as group-led instruction   Resistance Training Performed Yes   VAD Patient? No     Pain Assessment   Currently in Pain? No/denies   Pain Score 0-No pain   Multiple Pain Sites No      Capillary Blood Glucose: No results found for this or any previous visit (from the past 24 hour(s)).   Goals Met:  Independence with exercise equipment Exercise tolerated well No report of cardiac concerns or symptoms Strength training completed today  Goals Unmet:  Not Applicable  Comments: Check out 1145   Dr. Kate Sable is Medical Director for [place] and Pulmonary Rehab.

## 2016-04-04 ENCOUNTER — Telehealth: Payer: Self-pay | Admitting: Physician Assistant

## 2016-04-04 ENCOUNTER — Encounter (HOSPITAL_COMMUNITY): Payer: Medicare Other

## 2016-04-04 ENCOUNTER — Encounter (HOSPITAL_COMMUNITY)
Admission: RE | Admit: 2016-04-04 | Discharge: 2016-04-04 | Disposition: A | Discharge status: Home / Self Care | Payer: Medicare Other | Source: Ambulatory Visit | Attending: Internal Medicine | Admitting: Internal Medicine

## 2016-04-04 DIAGNOSIS — I5032 Chronic diastolic (congestive) heart failure: Secondary | ICD-10-CM | POA: Diagnosis not present

## 2016-04-04 NOTE — Progress Notes (Signed)
Pulmonary Individual Treatment Plan  Patient Details  Name: [name] MRN: [id number] Date of Birth: 30-Nov-1934 Referring Provider:   Flowsheet Row PULMONARY REHAB OTHER RESP ORIENTATION from 02/06/2016 in [facility]  Referring Provider  Dr. Haroldine Laws      Initial Encounter Date:  Flowsheet Row PULMONARY REHAB OTHER RESP ORIENTATION from 02/06/2016 in [facility]  Date  02/06/16  Referring Provider  Dr. Haroldine Laws      Visit Diagnosis: Heart failure, diastolic, chronic ([place])  Patient's Home Medications on Admission:   Current Outpatient Prescriptions:  .  ALPRAZolam (XANAX) 0.5 MG tablet, Take 0.5 mg by mouth at bedtime. , Disp: , Rfl:  .  alum hydroxide-mag trisilicate (GAVISCON) [id number] MG CHEW chewable tablet, Chew 1 tablet by mouth 3 (three) times daily as needed for indigestion or heartburn., Disp: , Rfl:  .  ambrisentan (LETAIRIS) 5 MG tablet, Take 1 tablet (5 mg total) by mouth daily., Disp: 20 tablet, Rfl: 0 .  apixaban (ELIQUIS) 5 MG TABS tablet, Take 1 tablet (5 mg total) by mouth 2 (two) times daily., Disp: 60 tablet, Rfl: 3 .  atorvastatin (LIPITOR) 80 MG tablet, TAKE 1/2 TABLET BY MOUTH DAILY, Disp: 45 tablet, Rfl: 2 .  AZOPT 1 % ophthalmic suspension, Place 1 drop into both eyes 3 (three) times daily. , Disp: , Rfl:  .  brimonidine (ALPHAGAN) 0.2 % ophthalmic solution, Place 1 drop into both eyes 2 (two) times daily. , Disp: , Rfl:  .  Coenzyme Q10 (COQ10) 200 MG CAPS, Take 200 mg by mouth daily., Disp: , Rfl:  .  colchicine 0.6 MG tablet, Take 0.6 mg by mouth daily., Disp: , Rfl:  .  fluticasone (FLONASE) 50 MCG/ACT nasal spray, Place 2 sprays into both nostrils daily. , Disp: , Rfl:  .  HYDROcodone-acetaminophen (NORCO/VICODIN) 5-325 MG tablet, Take 1 tablet by mouth 2 (two) times daily as needed for moderate pain. , Disp: , Rfl:  .  latanoprost (XALATAN) 0.005 % ophthalmic solution, Place 1 drop into the right eye at  bedtime. , Disp: , Rfl:  .  lidocaine (XYLOCAINE) 5 % ointment, Apply 1 application topically daily as needed (knee pain). , Disp: , Rfl:  .  losartan (COZAAR) 100 MG tablet, Take 100 mg by mouth every evening., Disp: , Rfl:  .  nitroGLYCERIN (NITROSTAT) 0.4 MG SL tablet, Place 0.4 mg under the tongue every 5 (five) minutes as needed for chest pain (x 3 tablets daily). Reported on 12/28/2015, Disp: , Rfl:  .  ondansetron (ZOFRAN ODT) 8 MG disintegrating tablet, Take 1 tablet (8 mg total) by mouth every 8 (eight) hours as needed for nausea or vomiting., Disp: 10 tablet, Rfl: 0 .  pantoprazole (PROTONIX) 20 MG tablet, Take 20 mg by mouth daily. Reported on 02/10/2016, Disp: , Rfl:  .  potassium chloride SA (K-DUR,KLOR-CON) 20 MEQ tablet, Take 1 tablet (20 mEq total) by mouth daily., Disp: 30 tablet, Rfl: 3 .  Probiotic Product (PROBIOTIC PO), Take 1 tablet by mouth 2 (two) times daily., Disp: , Rfl:  .  spironolactone (ALDACTONE) 25 MG tablet, Take 0.5 tablets (12.5 mg total) by mouth daily., Disp: 15 tablet, Rfl: 3 .  tadalafil, PAH, (ADCIRCA) 20 MG tablet, Take 2 tablets (40 mg total) by mouth daily., Disp: 60 tablet, Rfl: 5 .  timolol (TIMOPTIC) 0.5 % ophthalmic solution, Place 1 drop into both eyes 2 (two) times daily., Disp: , Rfl:  .  torsemide (DEMADEX) 20 MG tablet, Take  2 tablets (40 mg total) by mouth daily., Disp: 180 tablet, Rfl: 0  Past Medical History: Past Medical History:  Diagnosis Date  . A-fib ([place])   . Arthritis    "knees" (11/17/2015)  . CAD (coronary artery disease)    BMS to LAD 07/2005  . Chronic atrial fibrillation ([place])    Since 2011  . Degenerative arthritis of right knee   . Essential hypertension   . GERD (gastroesophageal reflux disease)   . Hyperlipidemia   . Morbid obesity ([place])   . Myocardial infarction ([place]) 07/2005  . On home oxygen therapy    "2.5L at night" (11/17/2015)  . Psoriasis   . Pulmonary hypertension ([facility])    Mixed, PASP 67 mmHg at [facility] 2013  .  Right ventricular dysfunction   . Tricuspid regurgitation    Severe    Tobacco Use: History  Smoking Status  . Former Smoker  . Packs/day: 0.30  . Years: 0.50  . Types: Cigarettes  . Quit date: 08/19/1986  Smokeless Tobacco  . Never Used    Comment: pt only smoked for only a few months.    Labs: Recent Review Flowsheet Data    Labs for ITP Cardiac and Pulmonary Rehab Latest Ref Rng & Units 08/04/2012 08/04/2012 08/04/2012 12/07/2015 12/07/2015   Cholestrol 0 - 200 mg/dL - - - - -   LDLCALC 0 - 99 mg/dL - - - - -   HDL >39.00 mg/dL - - - - -   Trlycerides 0.0 - 149.0 mg/dL - - - - -   PHART 7.350 - 7.450 7.379 - - - -   PCO2ART 35.0 - 45.0 mmHg 39.6 - - - -   HCO3 20.0 - 24.0 mEq/L 23.4 24.9(H) 24.2(H) 29.5(H) 28.7(H)   TCO2 0 - 100 mmol/L 25 26 25 31 30    ACIDBASEDEF 0.0 - 2.0 mmol/L 2.0 - 1.0 - -   O2SAT % 94.0 61.0 61.0 66.0 65.0      Capillary Blood Glucose: Lab Results  Component Value Date   GLUCAP 97 04/09/2014     ADL UCSD:     Pulmonary Assessment Scores    Row Name 02/06/16 1712         ADL UCSD   ADL Phase Entry     SOB Score total 50     Rest 0     Walk 6     Stairs 3     Bath 2     Dress 2     Shop 3       CAT Score   CAT Score 16       mMRC Score   mMRC Score 2        Pulmonary Function Assessment:     Pulmonary Function Assessment - 02/06/16 1709      Pulmonary Function Tests   FVC% 46 %   FEV1% 47 %   FEV1/FVC Ratio 74   RV% 98 %   DLCO% 27 %     Initial Spirometry Results   FVC% 1.31 %   FEV1% 0.93 %   FEV1/FVC Ratio 71     Post Bronchodilator Spirometry Results   FVC% 1.26 %   FEV1% 0.94 %   FEV1/FVC Ratio 74     Breath   Shortness of Breath Yes      Exercise Target Goals:    Exercise Program Goal: Individual exercise prescription set with THRR, safety & activity barriers. Participant demonstrates ability to understand and report RPE using  BORG scale, to self-measure pulse accurately, and to acknowledge the  importance of the exercise prescription.  Exercise Prescription Goal: Starting with aerobic activity 30 plus minutes a day, 3 days per week for initial exercise prescription. Provide home exercise prescription and guidelines that participant acknowledges understanding prior to discharge.  Activity Barriers & Risk Stratification:     Activity Barriers & Cardiac Risk Stratification - 02/06/16 1651      Activity Barriers & Cardiac Risk Stratification   Activity Barriers Back Problems;Deconditioning  Left knee problems   Cardiac Risk Stratification High      6 Minute Walk:     6 Minute Walk    Row Name 02/07/16 1400         6 Minute Walk   Phase Initial     Distance 735 feet     Walk Time 6 minutes     # of Rest Breaks 0     MPH 1.39     METS 2.06     RPE 13     Perceived Dyspnea  13     VO2 Peak 3.38     Symptoms No     Resting HR 62 bpm     Resting BP 110/60     Max Ex. HR 103 bpm     Max Ex. BP 144/62     2 Minute Post BP 122/60        Initial Exercise Prescription:     Initial Exercise Prescription - 02/07/16 1400      Date of Initial Exercise RX and Referring Provider   Date 02/06/16   Referring Provider Dr. Haroldine Laws     Treadmill   MPH 0.6   Grade 0   Minutes 15   METs 1.4     NuStep   Level 2   Watts 11   Minutes 15   METs 1.8     Arm Ergometer   Level 1.5   Watts 15   Minutes 15   METs 2.1     Prescription Details   Frequency (times per week) 2   Duration Progress to 30 minutes of continuous aerobic without signs/symptoms of physical distress     Intensity   THRR REST +  20   THRR 40-80% of Max Heartrate [phone]   Ratings of Perceived Exertion 11-13   Perceived Dyspnea 0-4     Progression   Progression Continue to progress workloads to maintain intensity without signs/symptoms of physical distress.     Resistance Training   Training Prescription Yes   Weight 1   Reps 10-12      Perform Capillary Blood Glucose checks as  needed.  Exercise Prescription Changes:      Exercise Prescription Changes    Row Name 02/13/16 1200 02/13/16 1700 02/22/16 0700 03/05/16 1200 03/13/16 0900     Exercise Review   Progression No No No No Yes     Response to Exercise   Blood Pressure (Admit) 98/50 98/50 98/54  96/52 100/52   Blood Pressure (Exercise) 122/60 122/60 136/72 124/60 138/64   Blood Pressure (Exit) 92/50 92/50 104/60 104/58 104/54   Heart Rate (Admit) 54 bpm 54 bpm 94 bpm 57 bpm 71 bpm   Heart Rate (Exercise) 60 bpm 60 bpm 83 bpm 74 bpm 74 bpm   Heart Rate (Exit) 58 bpm 58 bpm 72 bpm 66 bpm 80 bpm   Oxygen Saturation (Admit) 90 % 90 % 90 % 95 % 97 %   Oxygen Saturation (Exercise) 93 %  93 % 86 % 90 % 90 %   Oxygen Saturation (Exit) 90 % 90 % 91 % 90 % 89 %   Rating of Perceived Exertion (Exercise) 13 13 11 11 15    Perceived Dyspnea (Exercise) 11 11 11 11 15    Comments Patient discontinued treadmill at 10 minutes due to fatigue. Her prescription will be changed to Nustep to prevent this fatigue. Patient discontinued treadmill at 10 minutes due to fatigue. Her prescription will be changed to Nustep to prevent this fatigue. Patient discontinued treadmill at 10 minutes due to fatigue. Her prescription will be changed to Nustep to prevent this fatigue. Patient discontinued treadmill at 10 minutes due to fatigue. Her prescription will be changed to Nustep to prevent this fatigue.  -   Duration Progress to 30 minutes of continuous aerobic without signs/symptoms of physical distress Progress to 30 minutes of continuous aerobic without signs/symptoms of physical distress Progress to 30 minutes of continuous aerobic without signs/symptoms of physical distress Progress to 30 minutes of continuous aerobic without signs/symptoms of physical distress Progress to 30 minutes of continuous aerobic without signs/symptoms of physical distress   Intensity Rest + 20 Rest + 20 Rest + 20 Rest + 20 Rest + 20     Progression   Progression  Continue to progress workloads to maintain intensity without signs/symptoms of physical distress. Continue to progress workloads to maintain intensity without signs/symptoms of physical distress. Continue to progress workloads to maintain intensity without signs/symptoms of physical distress. Continue to progress workloads to maintain intensity without signs/symptoms of physical distress. Continue to progress workloads to maintain intensity without signs/symptoms of physical distress.     Resistance Training   Training Prescription No No No No Yes   Weight 1 1 1 2 2    Reps 10-12 8-10 8-10 8-10 10-12     Treadmill   MPH  - 0.06 0.06 0.06  -   Grade  - 0 0 0  -   Minutes  - 15 15 15   -   METs  - 1.49 1.49 1.49  -     NuStep   Level 2  - 2 2 3    Watts 11  - 13 10 13    Minutes 15  - 15 15 15    METs 1.8  - 3.67 3.66 3.65     Arm Ergometer   Level 1.5 1.5 1.5 2 2.2   Watts 12 12 9 22 12    Minutes 20 20 20 20 20    METs 1.8 1.8 2 2.6 1.9     Home Exercise Plan   Plans to continue exercise at [place]   Frequency Add 3 additional days to program exercise sessions. Add 3 additional days to program exercise sessions. Add 3 additional days to program exercise sessions. Add 3 additional days to program exercise sessions. Add 2 additional days to program exercise sessions.   [place] Name 03/26/16 1200 04/04/16 0700           Exercise Review   Progression Yes Yes        Response to Exercise   Blood Pressure (Admit) 90/48 104/60      Blood Pressure (Exercise) 122/72 122/64      Blood Pressure (Exit) 102/50 96/46      Heart Rate (Admit) 69 bpm 58 bpm      Heart Rate (Exercise) 87 bpm 104 bpm      Heart Rate (Exit) 74 bpm 58 bpm  Oxygen Saturation (Admit) 97 % 92 %      Oxygen Saturation (Exercise) 92 % 90 %      Oxygen Saturation (Exit) 91 % 90 %      Rating of Perceived Exertion (Exercise) 11 13      Perceived Dyspnea (Exercise) 11 13      Duration Progress to 30  minutes of continuous aerobic without signs/symptoms of physical distress Progress to 30 minutes of continuous aerobic without signs/symptoms of physical distress      Intensity Rest + 20 Rest + 20        Progression   Progression Continue to progress workloads to maintain intensity without signs/symptoms of physical distress. Continue to progress workloads to maintain intensity without signs/symptoms of physical distress.        Resistance Training   Training Prescription Yes Yes      Weight 2 2      Reps 10-12 10-12        NuStep   Level 3 3      Watts 14 12      Minutes 15 19      METs 3.65 3.65        Arm Ergometer   Level 2.3 2.3      Watts 25 19      Minutes 20 20      METs 2.9 2.4        Home Exercise Plan   Plans to continue exercise at [place] 2 additional days to program exercise sessions. Add 2 additional days to program exercise sessions.         Exercise Comments:      Exercise Comments    Row Name 03/13/16 [id number] 03/26/16 1250         Exercise Comments [place] is proggressing appropriately  Patient is progressing appropriately         Discharge Exercise Prescription (Final Exercise Prescription Changes):     Exercise Prescription Changes - 04/04/16 0700      Exercise Review   Progression Yes     Response to Exercise   Blood Pressure (Admit) 104/60   Blood Pressure (Exercise) 122/64   Blood Pressure (Exit) 96/46   Heart Rate (Admit) 58 bpm   Heart Rate (Exercise) 104 bpm   Heart Rate (Exit) 58 bpm   Oxygen Saturation (Admit) 92 %   Oxygen Saturation (Exercise) 90 %   Oxygen Saturation (Exit) 90 %   Rating of Perceived Exertion (Exercise) 13   Perceived Dyspnea (Exercise) 13   Duration Progress to 30 minutes of continuous aerobic without signs/symptoms of physical distress   Intensity Rest + 20     Progression   Progression Continue to progress workloads to maintain intensity without signs/symptoms of physical distress.      Resistance Training   Training Prescription Yes   Weight 2   Reps 10-12     NuStep   Level 3   Watts 12   Minutes 19   METs 3.65     Arm Ergometer   Level 2.3   Watts 19   Minutes 20   METs 2.4     Home Exercise Plan   Plans to continue exercise at Home   Frequency Add 2 additional days to program exercise sessions.      Nutrition:  Target Goals: Understanding of nutrition guidelines, daily intake of sodium 1500mg , cholesterol 200mg , calories 30% from fat and 7% or less from saturated  fats, daily to have 5 or more servings of fruits and vegetables.  Biometrics:     Pre Biometrics - 02/07/16 1404      Pre Biometrics   Height 5\' 3"  (1.6 m)   Weight 217 lb 9.5 oz (98.7 kg)   Waist Circumference 50 inches   Hip Circumference 47.5 inches   Waist to Hip Ratio 1.05 %   BMI (Calculated) 38.6   Triceps Skinfold 21 mm   % Body Fat 29 %   Grip Strength 40.3 kg   Flexibility 0 in   Single Leg Stand 0 seconds       Nutrition Therapy Plan and Nutrition Goals:   Nutrition Discharge: Rate Your Plate Scores:     Nutrition Assessments - 02/06/16 1715      Rate Your Plate Scores   Pre Score 64      Psychosocial: Target Goals: Acknowledge presence or absence of depression, maximize coping skills, provide positive support system. Participant is able to verbalize types and ability to use techniques and skills needed for reducing stress and depression.  Initial Review & Psychosocial Screening:     Initial Psych Review & Screening - 02/06/16 1719      Initial Review   Current issues with --  No issues     Family Dynamics   Good Support System? Yes     Barriers   Psychosocial barriers to participate in program There are no identifiable barriers or psychosocial needs.     Screening Interventions   Interventions Encouraged to exercise      Quality of Life Scores:     Quality of Life - 03/14/16 1244      Quality of Life Scores   Health/Function Pre  21 %   Socioeconomic Pre 29.64 %   Psych/Spiritual Pre 29 %   Family Pre 30 %   GLOBAL Pre 25.65 %      PHQ-9: Recent Review Flowsheet Data    Depression screen [facility] 2/9 02/06/2016   Decreased Interest 0   Down, Depressed, Hopeless 0   PHQ - 2 Score 0   Altered sleeping 0   Tired, decreased energy 0   Change in appetite 0   Feeling bad or failure about yourself  0   Trouble concentrating 0   Moving slowly or fidgety/restless 0   Suicidal thoughts 0   PHQ-9 Score 0   Difficult doing work/chores Somewhat difficult      Psychosocial Evaluation and Intervention:     Psychosocial Evaluation - 02/13/16 1800      Psychosocial Evaluation & Interventions   Interventions Encouraged to exercise with the program and follow exercise prescription;Relaxation education   Continued Psychosocial Services Needed No      Psychosocial Re-Evaluation:     Psychosocial Re-Evaluation    [place] Name 03/14/16 1245 04/04/16 0703           Psychosocial Re-Evaluation   Interventions  - Encouraged to attend Pulmonary Rehabilitation for the exercise      Comments Patient has no psychosocial issues. Her PHQ-9 score is 0 and her QOL score is 25.65. Patient does not need counseling.  Patient has no psychosocial issues. Her PHQ-9 score is 0 and her QOL score is 25.65. Patient does not need counseling.       Continued Psychosocial Services Needed No No         Education: Education Goals: Education classes will be provided on a weekly basis, covering required topics. Participant will state understanding/return demonstration of  topics presented.  Learning Barriers/Preferences:     Learning Barriers/Preferences - 02/06/16 1652      Learning Barriers/Preferences   Learning Barriers None   Learning Preferences Written Material      Education Topics: How Lungs Work and Diseases: - Discuss the anatomy of the lungs and diseases that can affect the lungs, such as COPD. Flowsheet Row PULMONARY  REHAB OTHER RESPIRATORY from 03/28/2016 in [place]  Date  03/21/16  Educator  DC  Instruction Review Code  2- meets goals/outcomes      Exercise: -Discuss the importance of exercise, FITT principles of exercise, normal and abnormal responses to exercise, and how to exercise safely. Flowsheet Row PULMONARY REHAB OTHER RESPIRATORY from 03/28/2016 in [place]  Date  03/28/16  Educator  DC  Instruction Review Code  2- meets goals/outcomes      Environmental Irritants: -Discuss types of environmental irritants and how to limit exposure to environmental irritants.   Meds/Inhalers and oxygen: - Discuss respiratory medications, definition of an inhaler and oxygen, and the proper way to use an inhaler and oxygen.   Energy Saving Techniques: - Discuss methods to conserve energy and decrease shortness of breath when performing activities of daily living.    Bronchial Hygiene / Breathing Techniques: - Discuss breathing mechanics, pursed-lip breathing technique,  proper posture, effective ways to clear airways, and other functional breathing techniques   Cleaning Equipment: - Provides group verbal and written instruction about the health risks of elevated stress, cause of high stress, and healthy ways to reduce stress.   Nutrition I: Fats: - Discuss the types of cholesterol, what cholesterol does to the body, and how cholesterol levels can be controlled.   Nutrition II: Labels: -Discuss the different components of food labels and how to read food labels. Flowsheet Row PULMONARY REHAB OTHER RESPIRATORY from 03/28/2016 in [place]  Date  02/15/16  Educator  Russella Dar  Instruction Review Code  2- meets goals/outcomes      Respiratory Infections: - Discuss the signs and symptoms of respiratory infections, ways to prevent respiratory infections, and the importance of seeking medical treatment when having a  respiratory infection. Flowsheet Row PULMONARY REHAB OTHER RESPIRATORY from 03/28/2016 in [place]  Date  02/22/16  Educator  DC  Instruction Review Code  2- meets goals/outcomes      Stress I: Signs and Symptoms: - Discuss the causes of stress, how stress may lead to anxiety and depression, and ways to limit stress. Flowsheet Row PULMONARY REHAB OTHER RESPIRATORY from 03/28/2016 in [place]  Date  02/29/16  Educator  DC  Instruction Review Code  2- meets goals/outcomes      Stress II: Relaxation: -Discuss relaxation techniques to limit stress. Flowsheet Row PULMONARY REHAB OTHER RESPIRATORY from 03/28/2016 in [place]  Date  03/07/16  Educator  DC  Instruction Review Code  2- meets goals/outcomes      Oxygen for Home/Travel: - Discuss how to prepare for travel when on oxygen and proper ways to transport and store oxygen to ensure safety. Flowsheet Row PULMONARY REHAB OTHER RESPIRATORY from 03/28/2016 in [place]  Date  03/14/16  Educator  DJ  Instruction Review Code  2- meets goals/outcomes      Knowledge Questionnaire Score:     Knowledge Questionnaire Score - 02/06/16 1708      Knowledge Questionnaire Score   Pre Score 11/14      Core  Components/Risk Factors/Patient Goals at Admission:     Personal Goals and Risk Factors at Admission - 02/06/16 1716      Core Components/Risk Factors/Patient Goals on Admission    Weight Management Weight Loss   Increase Strength and Stamina Yes   Intervention Provide advice, education, support and counseling about physical activity/exercise needs.;Develop an individualized exercise prescription for aerobic and resistive training based on initial evaluation findings, risk stratification, comorbidities and participant's personal goals.   Expected Outcomes Achievement of increased cardiorespiratory fitness and enhanced flexibility,  muscular endurance and strength shown through measurements of functional capacity and personal statement of participant.   Improve shortness of breath with ADL's Yes   Intervention Provide education, individualized exercise plan and daily activity instruction to help decrease symptoms of SOB with activities of daily living.   Expected Outcomes Short Term: Achieves a reduction of symptoms when performing activities of daily living.   Develop more efficient breathing techniques such as purse lipped breathing and diaphragmatic breathing; and practicing self-pacing with activity Yes   Intervention Provide education, demonstration and support about specific breathing techniuqes utilized for more efficient breathing. Include techniques such as pursed lipped breathing, diaphragmatic breathing and self-pacing activity.   Expected Outcomes Short Term: Participant will be able to demonstrate and use breathing techniques as needed throughout daily activities.   Heart Failure Yes   Intervention Provide a combined exercise and nutrition program that is supplemented with education, support and counseling about heart failure. Directed toward relieving symptoms such as shortness of breath, decreased exercise tolerance, and extremity edema.   Expected Outcomes Improve functional capacity of life;Short term: Attendance in program 2-3 days a week with increased exercise capacity. Reported lower sodium intake. Reported increased fruit and vegetable intake. Reports medication compliance.;Short term: Daily weights obtained and reported for increase. Utilizing diuretic protocols set by physician.;Long term: Adoption of self-care skills and reduction of barriers for early signs and symptoms recognition and intervention leading to self-care maintenance.   Personal Goal Other Yes   Personal Goal Breath better, understand her donction better, feel better overall and continue to feel better.    Intervention Attend 2xweek in [place] and  supplement with 2 more days at home of exercise.    Expected Outcomes To feel stronger and breath better      Core Components/Risk Factors/Patient Goals Review:      Goals and Risk Factor Review    Row Name 02/13/16 1759 03/14/16 1022 04/04/16 0703         Core Components/Risk Factors/Patient Goals Review   Personal Goals Review Weight Management/Obesity;Increase Strength and Stamina;Develop more efficient breathing techniques such as purse lipped breathing and diaphragmatic breathing and practicing self-pacing with activity. Improve shortness of breath with ADL's;Increase Strength and Stamina;Other  Get better overall.  Improve shortness of breath with ADL's;Increase Strength and Stamina;Other     Review  - Patient's SOB is improving overall and she feels she is benefiting from the program.  Patient's SOB is improving overall and she feels she is benefiting from the program.      Expected Outcomes Get better overall, stay better, breath better Continue to improve SOB and increase strength and stamina.  Continue to improve SOB and increase strength and stamina.         Core Components/Risk Factors/Patient Goals at Discharge (Final Review):      Goals and Risk Factor Review - 04/04/16 0703      Core Components/Risk Factors/Patient Goals Review   Personal Goals Review Improve shortness of  breath with ADL's;Increase Strength and Stamina;Other   Review Patient's SOB is improving overall and she feels she is benefiting from the program.    Expected Outcomes Continue to improve SOB and increase strength and stamina.       ITP Comments:   Comments: Pt able to follow exercise prescription. Will continue to monitor for progression.

## 2016-04-04 NOTE — Telephone Encounter (Addendum)
Pt called answering service - is on Eliquis and has done well recently. Got a letter from [facility] her that she should be on apixaban 2.5mg  dose instead. She is 101kg, Cr 1.2, and age 81 - therefore I feel she is on the correct dose of 5mg  BID and told her I would continue this dose for now. Will forward to Dr. Haroldine Laws to see if there is any other change that needs to be made - the patient says [facility] had reached out to him as well. I told the patient his office would contact her if any dose adjustments needed to be made.  Alanna Storti PA-C

## 2016-04-04 NOTE — Progress Notes (Signed)
Daily Session Note  Patient Details  Name: [name] MRN: [id number] Date of Birth: [birth year] Referring Provider:   [place] from 02/06/2016 in [place]  Referring Provider  Dr. Haroldine Laws      Encounter Date: 04/04/2016  Check In:     Session Check In - 04/04/16 1045      Check-In   Location [facility]   Staff Present Russella Dar, MS, EP, [facility], Exercise Physiologist;Gregory Luther Parody, BS, EP, Exercise Physiologist   Supervising physician immediately available to respond to emergencies See telemetry face sheet for immediately available MD   Medication changes reported     No   Fall or balance concerns reported    No   Warm-up and Cool-down Performed as group-led instruction   Resistance Training Performed Yes   VAD Patient? No     Pain Assessment   Pain Score 0-No pain   Multiple Pain Sites No      Capillary Blood Glucose: No results found for this or any previous visit (from the past 24 hour(s)).      Exercise Prescription Changes - 04/04/16 0700      Exercise Review   Progression Yes     Response to Exercise   Blood Pressure (Admit) 104/60   Blood Pressure (Exercise) 122/64   Blood Pressure (Exit) 96/46   Heart Rate (Admit) 58 bpm   Heart Rate (Exercise) 104 bpm   Heart Rate (Exit) 58 bpm   Oxygen Saturation (Admit) 92 %   Oxygen Saturation (Exercise) 90 %   Oxygen Saturation (Exit) 90 %   Rating of Perceived Exertion (Exercise) 13   Perceived Dyspnea (Exercise) 13   Duration Progress to 30 minutes of continuous aerobic without signs/symptoms of physical distress   Intensity Rest + 20     Progression   Progression Continue to progress workloads to maintain intensity without signs/symptoms of physical distress.     Resistance Training   Training Prescription Yes   Weight 2   Reps 10-12     NuStep   Level 3   Watts 12   Minutes 19   METs 3.65     Arm  Ergometer   Level 2.3   Watts 19   Minutes 20   METs 2.4     Home Exercise Plan   Plans to continue exercise at Home   Frequency Add 2 additional days to program exercise sessions.     Goals Met:  Independence with exercise equipment Exercise tolerated well No report of cardiac concerns or symptoms Strength training completed today  Goals Unmet:  Not Applicable  Comments:  Check out 1145   Dr. Kate Sable is Medical Director for [place] and Pulmonary Rehab.

## 2016-04-05 NOTE — Telephone Encounter (Signed)
Agree. 5 bid is correct dose

## 2016-04-09 ENCOUNTER — Encounter (HOSPITAL_COMMUNITY): Payer: Medicare Other

## 2016-04-09 ENCOUNTER — Encounter (HOSPITAL_COMMUNITY)
Admission: RE | Admit: 2016-04-09 | Discharge: 2016-04-09 | Disposition: A | Discharge status: Home / Self Care | Payer: Medicare Other | Source: Ambulatory Visit | Attending: Internal Medicine | Admitting: Internal Medicine

## 2016-04-09 DIAGNOSIS — I5032 Chronic diastolic (congestive) heart failure: Secondary | ICD-10-CM | POA: Diagnosis not present

## 2016-04-09 NOTE — Progress Notes (Signed)
Daily Session Note  Patient Details  Name: [name] MRN: [id number] Date of Birth: 08/22/1934 Referring Provider:   [place] from 02/06/2016 in [place]  Referring Provider  Dr. Haroldine Laws      Encounter Date: 04/09/2016  Check In:     Session Check In - 04/09/16 1045      Check-In   Location [facility]   Staff Present Diane Angelina Pih, MS, EP, [facility], Exercise Physiologist;Azai Gaffin Luther Parody, BS, EP, Exercise Physiologist   Supervising physician immediately available to respond to emergencies See telemetry face sheet for immediately available MD   Medication changes reported     No   Fall or balance concerns reported    No   Warm-up and Cool-down Performed as group-led instruction   Resistance Training Performed Yes   VAD Patient? No     Pain Assessment   Currently in Pain? No/denies   Pain Score 0-No pain   Multiple Pain Sites No      Capillary Blood Glucose: No results found for this or any previous visit (from the past 24 hour(s)).   Goals Met:  Independence with exercise equipment Exercise tolerated well No report of cardiac concerns or symptoms Strength training completed today  Goals Unmet:  Not Applicable  Comments: Check out 1145   Dr. Kate Sable is Medical Director for [place] and Pulmonary Rehab.

## 2016-04-11 ENCOUNTER — Encounter (HOSPITAL_COMMUNITY): Payer: Medicare Other

## 2016-04-11 ENCOUNTER — Encounter (HOSPITAL_COMMUNITY)
Admission: RE | Admit: 2016-04-11 | Discharge: 2016-04-11 | Disposition: A | Discharge status: Home / Self Care | Payer: Medicare Other | Source: Ambulatory Visit | Attending: Internal Medicine | Admitting: Internal Medicine

## 2016-04-11 DIAGNOSIS — I5032 Chronic diastolic (congestive) heart failure: Secondary | ICD-10-CM | POA: Diagnosis not present

## 2016-04-11 NOTE — Progress Notes (Signed)
Daily Session Note  Patient Details  Name: [name] MRN: [id number] Date of Birth: Sep 17, 1934 Referring Provider:   [place] from 02/06/2016 in [place]  Referring Provider  Dr. Haroldine Laws      Encounter Date: 04/11/2016  Check In:     Session Check In - 04/11/16 1045      Check-In   Location [facility]   Staff Present Diane Angelina Pih, MS, EP, [facility], Exercise Physiologist;Debra Wynetta Emery, RN, BSN;Saharsh Sterling, BS, EP, Exercise Physiologist   Supervising physician immediately available to respond to emergencies See telemetry face sheet for immediately available MD   Medication changes reported     No   Fall or balance concerns reported    No   Warm-up and Cool-down Performed as group-led instruction   Resistance Training Performed Yes   VAD Patient? No     Pain Assessment   Currently in Pain? No/denies   Pain Score 0-No pain   Multiple Pain Sites No      Capillary Blood Glucose: No results found for this or any previous visit (from the past 24 hour(s)).   Goals Met:  Independence with exercise equipment Exercise tolerated well No report of cardiac concerns or symptoms Strength training completed today  Goals Unmet:  Not Applicable  Comments: Check out 1145   Dr. Kate Sable is Medical Director for [place] and Pulmonary Rehab.

## 2016-04-16 ENCOUNTER — Encounter (HOSPITAL_COMMUNITY)
Admission: RE | Admit: 2016-04-16 | Discharge: 2016-04-16 | Disposition: A | Discharge status: Home / Self Care | Payer: Medicare Other | Source: Ambulatory Visit | Attending: Internal Medicine | Admitting: Internal Medicine

## 2016-04-16 ENCOUNTER — Encounter (HOSPITAL_COMMUNITY): Payer: Medicare Other

## 2016-04-16 DIAGNOSIS — I5032 Chronic diastolic (congestive) heart failure: Secondary | ICD-10-CM | POA: Diagnosis not present

## 2016-04-16 NOTE — Progress Notes (Signed)
Daily Session Note  Patient Details  Name: [name] MRN: [id number] Date of Birth: [birth year] Referring Provider:   [place] from 02/06/2016 in [place]  Referring Provider  Dr. Haroldine Laws      Encounter Date: 04/16/2016  Check In:     Session Check In - 04/16/16 1045      Check-In   Location [facility]   Staff Present Diane Angelina Pih, MS, EP, [facility], Exercise Physiologist;Jarian Longoria Luther Parody, BS, EP, Exercise Physiologist   Supervising physician immediately available to respond to emergencies See telemetry face sheet for immediately available MD   Medication changes reported     No   Fall or balance concerns reported    No   Warm-up and Cool-down Performed as group-led instruction   Resistance Training Performed Yes   VAD Patient? No     Pain Assessment   Currently in Pain? No/denies   Pain Score 0-No pain   Multiple Pain Sites No      Capillary Blood Glucose: No results found for this or any previous visit (from the past 24 hour(s)).   Goals Met:  Independence with exercise equipment Exercise tolerated well No report of cardiac concerns or symptoms Strength training completed today  Goals Unmet:  Not Applicable  Comments: Check out 1145   Dr. Kate Sable is Medical Director for [place] and Pulmonary Rehab.

## 2016-04-18 ENCOUNTER — Encounter (HOSPITAL_COMMUNITY)
Admission: RE | Admit: 2016-04-18 | Discharge: 2016-04-18 | Disposition: A | Discharge status: Home / Self Care | Payer: Medicare Other | Source: Ambulatory Visit | Attending: Internal Medicine | Admitting: Internal Medicine

## 2016-04-18 ENCOUNTER — Encounter (HOSPITAL_COMMUNITY): Payer: Medicare Other

## 2016-04-18 DIAGNOSIS — I5032 Chronic diastolic (congestive) heart failure: Secondary | ICD-10-CM

## 2016-04-18 NOTE — Progress Notes (Signed)
Daily Session Note  Patient Details  Name: [name] MRN: [id number] Date of Birth: 1934/10/24 Referring Provider:   [place] from 02/06/2016 in [place]  Referring Provider  Dr. Haroldine Laws      Encounter Date: 04/18/2016  Check In:     Session Check In - 04/18/16 1045      Check-In   Location [facility]   Staff Present Diane Angelina Pih, MS, EP, [facility], Exercise Physiologist;Debra Wynetta Emery, RN, BSN;Yoshi Vicencio, BS, EP, Exercise Physiologist   Supervising physician immediately available to respond to emergencies See telemetry face sheet for immediately available MD   Medication changes reported     No   Fall or balance concerns reported    No   Warm-up and Cool-down Performed as group-led instruction   Resistance Training Performed Yes   VAD Patient? No     Pain Assessment   Currently in Pain? No/denies   Pain Score 0-No pain   Multiple Pain Sites No      Capillary Blood Glucose: No results found for this or any previous visit (from the past 24 hour(s)).   Goals Met:  Independence with exercise equipment Exercise tolerated well No report of cardiac concerns or symptoms Strength training completed today  Goals Unmet:  Not Applicable  Comments: Check out 1145   Dr. Kate Sable is Medical Director for [place] and Pulmonary Rehab.

## 2016-04-23 ENCOUNTER — Encounter (HOSPITAL_COMMUNITY): Payer: Medicare Other

## 2016-04-24 DIAGNOSIS — Z1389 Encounter for screening for other disorder: Secondary | ICD-10-CM | POA: Diagnosis not present

## 2016-04-24 DIAGNOSIS — G894 Chronic pain syndrome: Secondary | ICD-10-CM | POA: Diagnosis not present

## 2016-04-24 DIAGNOSIS — J449 Chronic obstructive pulmonary disease, unspecified: Secondary | ICD-10-CM | POA: Diagnosis not present

## 2016-04-24 DIAGNOSIS — Z23 Encounter for immunization: Secondary | ICD-10-CM | POA: Diagnosis not present

## 2016-04-24 DIAGNOSIS — I10 Essential (primary) hypertension: Secondary | ICD-10-CM | POA: Diagnosis not present

## 2016-04-24 DIAGNOSIS — M47812 Spondylosis without myelopathy or radiculopathy, cervical region: Secondary | ICD-10-CM | POA: Diagnosis not present

## 2016-04-24 DIAGNOSIS — I4891 Unspecified atrial fibrillation: Secondary | ICD-10-CM | POA: Diagnosis not present

## 2016-04-25 ENCOUNTER — Encounter (HOSPITAL_COMMUNITY): Payer: Medicare Other

## 2016-04-30 ENCOUNTER — Encounter (HOSPITAL_COMMUNITY): Payer: Medicare Other

## 2016-04-30 ENCOUNTER — Encounter (HOSPITAL_COMMUNITY)
Admission: RE | Admit: 2016-04-30 | Discharge: 2016-04-30 | Disposition: A | Discharge status: Home / Self Care | Payer: Medicare Other | Source: Ambulatory Visit | Attending: Internal Medicine | Admitting: Internal Medicine

## 2016-04-30 DIAGNOSIS — I5032 Chronic diastolic (congestive) heart failure: Secondary | ICD-10-CM | POA: Insufficient documentation

## 2016-04-30 NOTE — Progress Notes (Signed)
Daily Session Note  Patient Details  Name: [name] MRN: [id number] Date of Birth: 1935/05/05 Referring Provider:   [place] from 02/06/2016 in [place]  Referring Provider  Dr. Haroldine Laws      Encounter Date: 04/30/2016  Check In:     Session Check In - 04/30/16 1045      Check-In   Location [facility]   Staff Present Diane Angelina Pih, MS, EP, [facility], Exercise Physiologist;Jaquon Gingerich Luther Parody, BS, EP, Exercise Physiologist   Supervising physician immediately available to respond to emergencies See telemetry face sheet for immediately available MD   Medication changes reported     No   Fall or balance concerns reported    No   Warm-up and Cool-down Performed as group-led instruction   Resistance Training Performed Yes   VAD Patient? No     Pain Assessment   Currently in Pain? No/denies   Pain Score 0-No pain   Multiple Pain Sites No      Capillary Blood Glucose: No results found for this or any previous visit (from the past 24 hour(s)).      Exercise Prescription Changes - 04/30/16 1000      Exercise Review   Progression Yes     Response to Exercise   Blood Pressure (Admit) 96/48   Blood Pressure (Exercise) 120/60   Blood Pressure (Exit) 96/52   Heart Rate (Admit) 54 bpm   Heart Rate (Exercise) 70 bpm   Heart Rate (Exit) 63 bpm   Oxygen Saturation (Admit) 97 %   Oxygen Saturation (Exercise) 90 %   Oxygen Saturation (Exit) 91 %   Rating of Perceived Exertion (Exercise) 13   Perceived Dyspnea (Exercise) 11   Duration Progress to 30 minutes of continuous aerobic without signs/symptoms of physical distress   Intensity Rest + 20     Progression   Progression Continue to progress workloads to maintain intensity without signs/symptoms of physical distress.     Resistance Training   Training Prescription Yes   Weight 3   Reps 10-12     NuStep   Level 2  Patient was brought down  a level due to arthritis    Watts 11   Minutes 19   METs 3.65     Arm Ergometer   Level 2.5   Watts 20   Minutes 20   METs 2.2     Home Exercise Plan   Plans to continue exercise at Home   Frequency Add 2 additional days to program exercise sessions.     Goals Met:  Independence with exercise equipment Exercise tolerated well No report of cardiac concerns or symptoms Strength training completed today  Goals Unmet:  Not Applicable  Comments: Check out 1145   Dr. Kate Sable is Medical Director for [place] and Pulmonary Rehab.

## 2016-05-02 ENCOUNTER — Encounter (HOSPITAL_COMMUNITY): Payer: Medicare Other

## 2016-05-02 ENCOUNTER — Encounter (HOSPITAL_COMMUNITY)
Admission: RE | Admit: 2016-05-02 | Discharge: 2016-05-02 | Disposition: A | Discharge status: Home / Self Care | Payer: Medicare Other | Source: Ambulatory Visit | Attending: Internal Medicine | Admitting: Internal Medicine

## 2016-05-02 DIAGNOSIS — I5032 Chronic diastolic (congestive) heart failure: Secondary | ICD-10-CM

## 2016-05-02 NOTE — Progress Notes (Signed)
Pulmonary Individual Treatment Plan  Patient Details  Name: [name] MRN: [id number] Date of Birth: 1935/07/26 Referring Provider:   Flowsheet Row PULMONARY REHAB OTHER RESP ORIENTATION from 02/06/2016 in [place]  Referring Provider  Dr. Haroldine Laws      Initial Encounter Date:  Flowsheet Row PULMONARY REHAB OTHER RESP ORIENTATION from 02/06/2016 in [facility]  Date  02/06/16  Referring Provider  Dr. Haroldine Laws      Visit Diagnosis: Diastolic CHF, chronic ([place])  Patient's Home Medications on Admission:   Current Outpatient Prescriptions:  .  ALPRAZolam (XANAX) 0.5 MG tablet, Take 0.5 mg by mouth at bedtime. , Disp: , Rfl:  .  alum hydroxide-mag trisilicate (GAVISCON) [id number] MG CHEW chewable tablet, Chew 1 tablet by mouth 3 (three) times daily as needed for indigestion or heartburn., Disp: , Rfl:  .  ambrisentan (LETAIRIS) 5 MG tablet, Take 1 tablet (5 mg total) by mouth daily., Disp: 20 tablet, Rfl: 0 .  apixaban (ELIQUIS) 5 MG TABS tablet, Take 1 tablet (5 mg total) by mouth 2 (two) times daily., Disp: 60 tablet, Rfl: 3 .  atorvastatin (LIPITOR) 80 MG tablet, TAKE 1/2 TABLET BY MOUTH DAILY, Disp: 45 tablet, Rfl: 2 .  AZOPT 1 % ophthalmic suspension, Place 1 drop into both eyes 3 (three) times daily. , Disp: , Rfl:  .  brimonidine (ALPHAGAN) 0.2 % ophthalmic solution, Place 1 drop into both eyes 2 (two) times daily. , Disp: , Rfl:  .  Coenzyme Q10 (COQ10) 200 MG CAPS, Take 200 mg by mouth daily., Disp: , Rfl:  .  colchicine 0.6 MG tablet, Take 0.6 mg by mouth daily., Disp: , Rfl:  .  fluticasone (FLONASE) 50 MCG/ACT nasal spray, Place 2 sprays into both nostrils daily. , Disp: , Rfl:  .  HYDROcodone-acetaminophen (NORCO/VICODIN) 5-325 MG tablet, Take 1 tablet by mouth 2 (two) times daily as needed for moderate pain. , Disp: , Rfl:  .  latanoprost (XALATAN) 0.005 % ophthalmic solution, Place 1 drop into the right eye at bedtime. ,  Disp: , Rfl:  .  lidocaine (XYLOCAINE) 5 % ointment, Apply 1 application topically daily as needed (knee pain). , Disp: , Rfl:  .  losartan (COZAAR) 100 MG tablet, Take 100 mg by mouth every evening., Disp: , Rfl:  .  nitroGLYCERIN (NITROSTAT) 0.4 MG SL tablet, Place 0.4 mg under the tongue every 5 (five) minutes as needed for chest pain (x 3 tablets daily). Reported on 12/28/2015, Disp: , Rfl:  .  ondansetron (ZOFRAN ODT) 8 MG disintegrating tablet, Take 1 tablet (8 mg total) by mouth every 8 (eight) hours as needed for nausea or vomiting., Disp: 10 tablet, Rfl: 0 .  pantoprazole (PROTONIX) 20 MG tablet, Take 20 mg by mouth daily. Reported on 02/10/2016, Disp: , Rfl:  .  potassium chloride SA (K-DUR,KLOR-CON) 20 MEQ tablet, Take 1 tablet (20 mEq total) by mouth daily., Disp: 30 tablet, Rfl: 3 .  Probiotic Product (PROBIOTIC PO), Take 1 tablet by mouth 2 (two) times daily., Disp: , Rfl:  .  spironolactone (ALDACTONE) 25 MG tablet, Take 0.5 tablets (12.5 mg total) by mouth daily., Disp: 15 tablet, Rfl: 3 .  tadalafil, PAH, (ADCIRCA) 20 MG tablet, Take 2 tablets (40 mg total) by mouth daily., Disp: 60 tablet, Rfl: 5 .  timolol (TIMOPTIC) 0.5 % ophthalmic solution, Place 1 drop into both eyes 2 (two) times daily., Disp: , Rfl:  .  torsemide (DEMADEX) 20 MG tablet, Take 2  tablets (40 mg total) by mouth daily., Disp: 180 tablet, Rfl: 0  Past Medical History: Past Medical History:  Diagnosis Date  . A-fib ([place])   . Arthritis    "knees" (11/17/2015)  . CAD (coronary artery disease)    BMS to LAD 07/2005  . Chronic atrial fibrillation ([place])    Since 2011  . Degenerative arthritis of right knee   . Essential hypertension   . GERD (gastroesophageal reflux disease)   . Hyperlipidemia   . Morbid obesity ([place])   . Myocardial infarction ([place]) 07/2005  . On home oxygen therapy    "2.5L at night" (11/17/2015)  . Psoriasis   . Pulmonary hypertension (H[facility])    Mixed, PASP 67 mmHg at [facility] 2013  . Right  ventricular dysfunction   . Tricuspid regurgitation    Severe    Tobacco Use: History  Smoking Status  . Former Smoker  . Packs/day: 0.30  . Years: 0.50  . Types: Cigarettes  . Quit date: 08/19/1986  Smokeless Tobacco  . Never Used    Comment: pt only smoked for only a few months.    Labs: Recent Review Flowsheet Data    Labs for ITP Cardiac and Pulmonary Rehab Latest Ref Rng & Units 08/04/2012 08/04/2012 08/04/2012 12/07/2015 12/07/2015   Cholestrol 0 - 200 mg/dL - - - - -   LDLCALC 0 - 99 mg/dL - - - - -   HDL >39.00 mg/dL - - - - -   Trlycerides 0.0 - 149.0 mg/dL - - - - -   PHART 7.350 - 7.450 7.379 - - - -   PCO2ART 35.0 - 45.0 mmHg 39.6 - - - -   HCO3 20.0 - 24.0 mEq/L 23.4 24.9(H) 24.2(H) 29.5(H) 28.7(H)   TCO2 0 - 100 mmol/L 25 26 25 31 30    ACIDBASEDEF 0.0 - 2.0 mmol/L 2.0 - 1.0 - -   O2SAT % 94.0 61.0 61.0 66.0 65.0      Capillary Blood Glucose: Lab Results  Component Value Date   GLUCAP 97 04/09/2014     ADL UCSD:     Pulmonary Assessment Scores    Row Name 02/06/16 1712         ADL UCSD   ADL Phase Entry     SOB Score total 50     Rest 0     Walk 6     Stairs 3     Bath 2     Dress 2     Shop 3       CAT Score   CAT Score 16       mMRC Score   mMRC Score 2        Pulmonary Function Assessment:     Pulmonary Function Assessment - 02/06/16 1709      Pulmonary Function Tests   FVC% 46 %   FEV1% 47 %   FEV1/FVC Ratio 74   RV% 98 %   DLCO% 27 %     Initial Spirometry Results   FVC% 1.31 %   FEV1% 0.93 %   FEV1/FVC Ratio 71     Post Bronchodilator Spirometry Results   FVC% 1.26 %   FEV1% 0.94 %   FEV1/FVC Ratio 74     Breath   Shortness of Breath Yes      Exercise Target Goals:    Exercise Program Goal: Individual exercise prescription set with THRR, safety & activity barriers. Participant demonstrates ability to understand and report RPE using BORG  scale, to self-measure pulse accurately, and to acknowledge the  importance of the exercise prescription.  Exercise Prescription Goal: Starting with aerobic activity 30 plus minutes a day, 3 days per week for initial exercise prescription. Provide home exercise prescription and guidelines that participant acknowledges understanding prior to discharge.  Activity Barriers & Risk Stratification:     Activity Barriers & Cardiac Risk Stratification - 02/06/16 1651      Activity Barriers & Cardiac Risk Stratification   Activity Barriers Back Problems;Deconditioning  Left knee problems   Cardiac Risk Stratification High      6 Minute Walk:     6 Minute Walk    Row Name 02/07/16 1400         6 Minute Walk   Phase Initial     Distance 735 feet     Walk Time 6 minutes     # of Rest Breaks 0     MPH 1.39     METS 2.06     RPE 13     Perceived Dyspnea  13     VO2 Peak 3.38     Symptoms No     Resting HR 62 bpm     Resting BP 110/60     Max Ex. HR 103 bpm     Max Ex. BP 144/62     2 Minute Post BP 122/60        Initial Exercise Prescription:     Initial Exercise Prescription - 02/07/16 1400      Date of Initial Exercise RX and Referring Provider   Date 02/06/16   Referring Provider Dr. Haroldine Laws     Treadmill   MPH 0.6   Grade 0   Minutes 15   METs 1.4     NuStep   Level 2   Watts 11   Minutes 15   METs 1.8     Arm Ergometer   Level 1.5   Watts 15   Minutes 15   METs 2.1     Prescription Details   Frequency (times per week) 2   Duration Progress to 30 minutes of continuous aerobic without signs/symptoms of physical distress     Intensity   THRR REST +  20   THRR 40-80% of Max Heartrate [phone]   Ratings of Perceived Exertion 11-13   Perceived Dyspnea 0-4     Progression   Progression Continue to progress workloads to maintain intensity without signs/symptoms of physical distress.     Resistance Training   Training Prescription Yes   Weight 1   Reps 10-12      Perform Capillary Blood Glucose checks as  needed.  Exercise Prescription Changes:     Exercise Prescription Changes    Row Name 02/13/16 1200 02/13/16 1700 02/22/16 0700 03/05/16 1200 03/13/16 0900     Exercise Review   Progression No No No No Yes     Response to Exercise   Blood Pressure (Admit) 98/50 98/50 98/54  96/52 100/52   Blood Pressure (Exercise) 122/60 122/60 136/72 124/60 138/64   Blood Pressure (Exit) 92/50 92/50 104/60 104/58 104/54   Heart Rate (Admit) 54 bpm 54 bpm 94 bpm 57 bpm 71 bpm   Heart Rate (Exercise) 60 bpm 60 bpm 83 bpm 74 bpm 74 bpm   Heart Rate (Exit) 58 bpm 58 bpm 72 bpm 66 bpm 80 bpm   Oxygen Saturation (Admit) 90 % 90 % 90 % 95 % 97 %   Oxygen Saturation (Exercise) 93 % 93 %  86 % 90 % 90 %   Oxygen Saturation (Exit) 90 % 90 % 91 % 90 % 89 %   Rating of Perceived Exertion (Exercise) 13 13 11 11 15    Perceived Dyspnea (Exercise) 11 11 11 11 15    Comments Patient discontinued treadmill at 10 minutes due to fatigue. Her prescription will be changed to Nustep to prevent this fatigue. Patient discontinued treadmill at 10 minutes due to fatigue. Her prescription will be changed to Nustep to prevent this fatigue. Patient discontinued treadmill at 10 minutes due to fatigue. Her prescription will be changed to Nustep to prevent this fatigue. Patient discontinued treadmill at 10 minutes due to fatigue. Her prescription will be changed to Nustep to prevent this fatigue.  -   Duration Progress to 30 minutes of continuous aerobic without signs/symptoms of physical distress Progress to 30 minutes of continuous aerobic without signs/symptoms of physical distress Progress to 30 minutes of continuous aerobic without signs/symptoms of physical distress Progress to 30 minutes of continuous aerobic without signs/symptoms of physical distress Progress to 30 minutes of continuous aerobic without signs/symptoms of physical distress   Intensity Rest + 20 Rest + 20 Rest + 20 Rest + 20 Rest + 20     Progression   Progression  Continue to progress workloads to maintain intensity without signs/symptoms of physical distress. Continue to progress workloads to maintain intensity without signs/symptoms of physical distress. Continue to progress workloads to maintain intensity without signs/symptoms of physical distress. Continue to progress workloads to maintain intensity without signs/symptoms of physical distress. Continue to progress workloads to maintain intensity without signs/symptoms of physical distress.     Resistance Training   Training Prescription No No No No Yes   Weight 1 1 1 2 2    Reps 10-12 8-10 8-10 8-10 10-12     Treadmill   MPH  - 0.06 0.06 0.06  -   Grade  - 0 0 0  -   Minutes  - 15 15 15   -   METs  - 1.49 1.49 1.49  -     NuStep   Level 2  - 2 2 3    Watts 11  - 13 10 13    Minutes 15  - 15 15 15    METs 1.8  - 3.67 3.66 3.65     Arm Ergometer   Level 1.5 1.5 1.5 2 2.2   Watts 12 12 9 22 12    Minutes 20 20 20 20 20    METs 1.8 1.8 2 2.6 1.9     Home Exercise Plan   Plans to continue exercise at [place]   Frequency Add 3 additional days to program exercise sessions. Add 3 additional days to program exercise sessions. Add 3 additional days to program exercise sessions. Add 3 additional days to program exercise sessions. Add 2 additional days to program exercise sessions.   [place] Name 03/26/16 1200 04/04/16 0700 04/30/16 1000         Exercise Review   Progression Yes Yes Yes       Response to Exercise   Blood Pressure (Admit) 90/48 104/60 96/48     Blood Pressure (Exercise) 122/72 122/64 120/60     Blood Pressure (Exit) 102/50 96/46 96/52      Heart Rate (Admit) 69 bpm 58 bpm 54 bpm     Heart Rate (Exercise) 87 bpm 104 bpm 70 bpm     Heart Rate (Exit) 74 bpm 58 bpm 63 bpm  Oxygen Saturation (Admit) 97 % 92 % 97 %     Oxygen Saturation (Exercise) 92 % 90 % 90 %     Oxygen Saturation (Exit) 91 % 90 % 91 %     Rating of Perceived Exertion (Exercise) 11 13 13       Perceived Dyspnea (Exercise) 11 13 11      Duration Progress to 30 minutes of continuous aerobic without signs/symptoms of physical distress Progress to 30 minutes of continuous aerobic without signs/symptoms of physical distress Progress to 30 minutes of continuous aerobic without signs/symptoms of physical distress     Intensity Rest + 20 Rest + 20 Rest + 20       Progression   Progression Continue to progress workloads to maintain intensity without signs/symptoms of physical distress. Continue to progress workloads to maintain intensity without signs/symptoms of physical distress. Continue to progress workloads to maintain intensity without signs/symptoms of physical distress.       Resistance Training   Training Prescription Yes Yes Yes     Weight 2 2 3      Reps 10-12 10-12 10-12       NuStep   Level 3 3 2   Patient was brought down a level due to arthritis      Watts 14 12 11      Minutes 15 19 19      METs 3.65 3.65 3.65       Arm Ergometer   Level 2.3 2.3 2.5     Watts 25 19 20      Minutes 20 20 20      METs 2.9 2.4 2.2       Home Exercise Plan   Plans to continue exercise at [place] 2 additional days to program exercise sessions. Add 2 additional days to program exercise sessions. Add 2 additional days to program exercise sessions.        Exercise Comments:     Exercise Comments    Row Name 03/13/16 [id number] 03/26/16 1250 04/30/16 1014       Exercise Comments [place] is proggressing appropriately  Patient is progressing appropriately Patient is proggressing although arthritis has been a hindering factor         Discharge Exercise Prescription (Final Exercise Prescription Changes):     Exercise Prescription Changes - 04/30/16 1000      Exercise Review   Progression Yes     Response to Exercise   Blood Pressure (Admit) 96/48   Blood Pressure (Exercise) 120/60   Blood Pressure (Exit) 96/52   Heart Rate (Admit) 54 bpm   Heart Rate (Exercise)  70 bpm   Heart Rate (Exit) 63 bpm   Oxygen Saturation (Admit) 97 %   Oxygen Saturation (Exercise) 90 %   Oxygen Saturation (Exit) 91 %   Rating of Perceived Exertion (Exercise) 13   Perceived Dyspnea (Exercise) 11   Duration Progress to 30 minutes of continuous aerobic without signs/symptoms of physical distress   Intensity Rest + 20     Progression   Progression Continue to progress workloads to maintain intensity without signs/symptoms of physical distress.     Resistance Training   Training Prescription Yes   Weight 3   Reps 10-12     NuStep   Level 2  Patient was brought down a level due to arthritis    Watts 11   Minutes 19   METs 3.65     Arm Ergometer   Level 2.5   Pascal Lux  20   Minutes 20   METs 2.2     Home Exercise Plan   Plans to continue exercise at Home   Frequency Add 2 additional days to program exercise sessions.      Nutrition:  Target Goals: Understanding of nutrition guidelines, daily intake of sodium 1500mg , cholesterol 200mg , calories 30% from fat and 7% or less from saturated fats, daily to have 5 or more servings of fruits and vegetables.  Biometrics:     Pre Biometrics - 02/07/16 1404      Pre Biometrics   Height 5\' 3"  (1.6 m)   Weight 217 lb 9.5 oz (98.7 kg)   Waist Circumference 50 inches   Hip Circumference 47.5 inches   Waist to Hip Ratio 1.05 %   BMI (Calculated) 38.6   Triceps Skinfold 21 mm   % Body Fat 29 %   Grip Strength 40.3 kg   Flexibility 0 in   Single Leg Stand 0 seconds       Nutrition Therapy Plan and Nutrition Goals:   Nutrition Discharge: Rate Your Plate Scores:     Nutrition Assessments - 02/06/16 1715      Rate Your Plate Scores   Pre Score 64      Psychosocial: Target Goals: Acknowledge presence or absence of depression, maximize coping skills, provide positive support system. Participant is able to verbalize types and ability to use techniques and skills needed for reducing stress and  depression.  Initial Review & Psychosocial Screening:     Initial Psych Review & Screening - 02/06/16 1719      Initial Review   Current issues with --  No issues     Family Dynamics   Good Support System? Yes     Barriers   Psychosocial barriers to participate in program There are no identifiable barriers or psychosocial needs.     Screening Interventions   Interventions Encouraged to exercise      Quality of Life Scores:     Quality of Life - 03/14/16 1244      Quality of Life Scores   Health/Function Pre 21 %   Socioeconomic Pre 29.64 %   Psych/Spiritual Pre 29 %   Family Pre 30 %   GLOBAL Pre 25.65 %      PHQ-9: Recent Review Flowsheet Data    Depression screen [facility] 2/9 02/06/2016   Decreased Interest 0   Down, Depressed, Hopeless 0   PHQ - 2 Score 0   Altered sleeping 0   Tired, decreased energy 0   Change in appetite 0   Feeling bad or failure about yourself  0   Trouble concentrating 0   Moving slowly or fidgety/restless 0   Suicidal thoughts 0   PHQ-9 Score 0   Difficult doing work/chores Somewhat difficult      Psychosocial Evaluation and Intervention:     Psychosocial Evaluation - 02/13/16 1800      Psychosocial Evaluation & Interventions   Interventions Encouraged to exercise with the program and follow exercise prescription;Relaxation education   Continued Psychosocial Services Needed No      Psychosocial Re-Evaluation:     Psychosocial Re-Evaluation    [place] Name 03/14/16 1245 04/04/16 0703           Psychosocial Re-Evaluation   Interventions  - Encouraged to attend Pulmonary Rehabilitation for the exercise      Comments Patient has no psychosocial issues. Her PHQ-9 score is 0 and her QOL score is 25.65. Patient does not  need counseling.  Patient has no psychosocial issues. Her PHQ-9 score is 0 and her QOL score is 25.65. Patient does not need counseling.       Continued Psychosocial Services Needed No No          Education: Education Goals: Education classes will be provided on a weekly basis, covering required topics. Participant will state understanding/return demonstration of topics presented.  Learning Barriers/Preferences:     Learning Barriers/Preferences - 02/06/16 1652      Learning Barriers/Preferences   Learning Barriers None   Learning Preferences Written Material      Education Topics: How Lungs Work and Diseases: - Discuss the anatomy of the lungs and diseases that can affect the lungs, such as COPD. Flowsheet Row PULMONARY REHAB OTHER RESPIRATORY from 05/02/2016 in [place]  Date  03/21/16  Educator  DC  Instruction Review Code  2- meets goals/outcomes      Exercise: -Discuss the importance of exercise, FITT principles of exercise, normal and abnormal responses to exercise, and how to exercise safely. Flowsheet Row PULMONARY REHAB OTHER RESPIRATORY from 05/02/2016 in [place]  Date  03/28/16  Educator  DC  Instruction Review Code  2- meets goals/outcomes      Environmental Irritants: -Discuss types of environmental irritants and how to limit exposure to environmental irritants. Flowsheet Row PULMONARY REHAB OTHER RESPIRATORY from 05/02/2016 in [place]  Date  04/04/16  Educator  Russella Dar  Instruction Review Code  2- meets goals/outcomes      Meds/Inhalers and oxygen: - Discuss respiratory medications, definition of an inhaler and oxygen, and the proper way to use an inhaler and oxygen. Flowsheet Row PULMONARY REHAB OTHER RESPIRATORY from 05/02/2016 in [place]  Date  04/11/16  Educator  Russella Dar  Instruction Review Code  2- meets goals/outcomes      Energy Saving Techniques: - Discuss methods to conserve energy and decrease shortness of breath when performing activities of daily living.  Flowsheet Row PULMONARY REHAB OTHER RESPIRATORY from 05/02/2016 in  [place]  Date  04/18/16  Educator  DC  Instruction Review Code  2- meets goals/outcomes      Bronchial Hygiene / Breathing Techniques: - Discuss breathing mechanics, pursed-lip breathing technique,  proper posture, effective ways to clear airways, and other functional breathing techniques   Cleaning Equipment: - Provides group verbal and written instruction about the health risks of elevated stress, cause of high stress, and healthy ways to reduce stress. Flowsheet Row PULMONARY REHAB OTHER RESPIRATORY from 05/02/2016 in [place]  Date  05/02/16  Educator  [place]  Instruction Review Code  2- meets goals/outcomes      Nutrition I: Fats: - Discuss the types of cholesterol, what cholesterol does to the body, and how cholesterol levels can be controlled.   Nutrition II: Labels: -Discuss the different components of food labels and how to read food labels. Flowsheet Row PULMONARY REHAB OTHER RESPIRATORY from 05/02/2016 in [place]  Date  02/15/16  Educator  Russella Dar  Instruction Review Code  2- meets goals/outcomes      Respiratory Infections: - Discuss the signs and symptoms of respiratory infections, ways to prevent respiratory infections, and the importance of seeking medical treatment when having a respiratory infection. Flowsheet Row PULMONARY REHAB OTHER RESPIRATORY from 05/02/2016 in [place]  Date  02/22/16  Educator  DC  Instruction Review Code  2- meets goals/outcomes  Stress I: Signs and Symptoms: - Discuss the causes of stress, how stress may lead to anxiety and depression, and ways to limit stress. Flowsheet Row PULMONARY REHAB OTHER RESPIRATORY from 05/02/2016 in [place]  Date  02/29/16  Educator  DC  Instruction Review Code  2- meets goals/outcomes      Stress II: Relaxation: -Discuss relaxation techniques to limit stress. Flowsheet  Row PULMONARY REHAB OTHER RESPIRATORY from 05/02/2016 in [place]  Date  03/07/16  Educator  DC  Instruction Review Code  2- meets goals/outcomes      Oxygen for Home/Travel: - Discuss how to prepare for travel when on oxygen and proper ways to transport and store oxygen to ensure safety. Flowsheet Row PULMONARY REHAB OTHER RESPIRATORY from 05/02/2016 in [place]  Date  03/14/16  Educator  DJ  Instruction Review Code  2- meets goals/outcomes      Knowledge Questionnaire Score:     Knowledge Questionnaire Score - 02/06/16 1708      Knowledge Questionnaire Score   Pre Score 11/14      Core Components/Risk Factors/Patient Goals at Admission:     Personal Goals and Risk Factors at Admission - 02/06/16 1716      Core Components/Risk Factors/Patient Goals on Admission    Weight Management Weight Loss   Increase Strength and Stamina Yes   Intervention Provide advice, education, support and counseling about physical activity/exercise needs.;Develop an individualized exercise prescription for aerobic and resistive training based on initial evaluation findings, risk stratification, comorbidities and participant's personal goals.   Expected Outcomes Achievement of increased cardiorespiratory fitness and enhanced flexibility, muscular endurance and strength shown through measurements of functional capacity and personal statement of participant.   Improve shortness of breath with ADL's Yes   Intervention Provide education, individualized exercise plan and daily activity instruction to help decrease symptoms of SOB with activities of daily living.   Expected Outcomes Short Term: Achieves a reduction of symptoms when performing activities of daily living.   Develop more efficient breathing techniques such as purse lipped breathing and diaphragmatic breathing; and practicing self-pacing with activity Yes   Intervention Provide education,  demonstration and support about specific breathing techniuqes utilized for more efficient breathing. Include techniques such as pursed lipped breathing, diaphragmatic breathing and self-pacing activity.   Expected Outcomes Short Term: Participant will be able to demonstrate and use breathing techniques as needed throughout daily activities.   Heart Failure Yes   Intervention Provide a combined exercise and nutrition program that is supplemented with education, support and counseling about heart failure. Directed toward relieving symptoms such as shortness of breath, decreased exercise tolerance, and extremity edema.   Expected Outcomes Improve functional capacity of life;Short term: Attendance in program 2-3 days a week with increased exercise capacity. Reported lower sodium intake. Reported increased fruit and vegetable intake. Reports medication compliance.;Short term: Daily weights obtained and reported for increase. Utilizing diuretic protocols set by physician.;Long term: Adoption of self-care skills and reduction of barriers for early signs and symptoms recognition and intervention leading to self-care maintenance.   Personal Goal Other Yes   Personal Goal Breath better, understand her donction better, feel better overall and continue to feel better.    Intervention Attend 2xweek in [place] and supplement with 2 more days at home of exercise.    Expected Outcomes To feel stronger and breath better      Core Components/Risk Factors/Patient Goals Review:      Goals and Risk Factor  Review    Row Name 02/13/16 1759 03/14/16 1022 04/04/16 0703 05/02/16 1352       Core Components/Risk Factors/Patient Goals Review   Personal Goals Review Weight Management/Obesity;Increase Strength and Stamina;Develop more efficient breathing techniques such as purse lipped breathing and diaphragmatic breathing and practicing self-pacing with activity. Improve shortness of breath with ADL's;Increase Strength and  Stamina;Other  Get better overall.  Improve shortness of breath with ADL's;Increase Strength and Stamina;Other Weight Management/Obesity;Increase Strength and Stamina;Improve shortness of breath with ADL's;Develop more efficient breathing techniques such as purse lipped breathing and diaphragmatic breathing and practicing self-pacing with activity.;Other  Get better overall.    Review  - Patient's SOB is improving overall and she feels she is benefiting from the program.  Patient's SOB is improving overall and she feels she is benefiting from the program.  After 22 sessions, patient has maintained her weight. 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 She returned 9/12 stating her OA was better. Her strength and stamina have improved along with her SOB and breathing. She continues to do well in the program.     Expected Outcomes Get better overall, stay better, breath better Continue to improve SOB and increase strength and stamina.  Continue to improve SOB and increase strength and stamina.  Patient will continue to attend program and meet her above stated goals.        Core Components/Risk Factors/Patient Goals at Discharge (Final Review):      Goals and Risk Factor Review - 05/02/16 1352      Core Components/Risk Factors/Patient Goals Review   Personal Goals Review Weight Management/Obesity;Increase Strength and Stamina;Improve shortness of breath with ADL's;Develop more efficient breathing techniques such as purse lipped breathing and diaphragmatic breathing and practicing self-pacing with activity.;Other  Get better overall.   Review After 22 sessions, patient has maintained her weight. 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 She returned 9/12 stating her OA was better. Her strength and stamina have improved along with her SOB and breathing. She continues to do well in the program.    Expected Outcomes Patient will  continue to attend program and meet her above stated goals.       ITP Comments:   Comments: ITP 30 Day REVIEW Pt is making expected progress toward pulmonary rehab goals after completing 22 sessions. Recommend continued exercise, life style modification, education, and utilization of breathing techniques to increase stamina and strength and decrease shortness of breath with exertion.

## 2016-05-02 NOTE — Progress Notes (Signed)
Daily Session Note  Patient Details  Name: [name] MRN: [id number] Date of Birth: [birth year] Referring Provider:   Flowsheet Row PULMONARY REHAB OTHER RESP ORIENTATION from 02/06/2016 in [place]  Referring Provider  Dr. Haroldine Laws      Encounter Date: 05/02/2016  Check In:     Session Check In - 05/02/16 1045      Check-In   Location [facility]   Staff Present Aundra Dubin, RN, BSN;Kerri Asche Luther Parody, BS, EP, Exercise Physiologist   Supervising physician immediately available to respond to emergencies See telemetry face sheet for immediately available MD   Medication changes reported     No   Fall or balance concerns reported    No   Warm-up and Cool-down Performed as group-led instruction   Resistance Training Performed Yes   VAD Patient? No     Pain Assessment   Currently in Pain? No/denies   Pain Score 0-No pain   Multiple Pain Sites No      Capillary Blood Glucose: No results found for this or any previous visit (from the past 24 hour(s)).   Goals Met:  Independence with exercise equipment Exercise tolerated well No report of cardiac concerns or symptoms Strength training completed today  Goals Unmet:  Not Applicable  Comments: Check out 1145   Dr. Kate Sable is Medical Director for [place] and Pulmonary Rehab.

## 2016-05-06 ENCOUNTER — Other Ambulatory Visit (HOSPITAL_COMMUNITY): Payer: Self-pay | Admitting: Internal Medicine

## 2016-05-07 ENCOUNTER — Encounter (HOSPITAL_COMMUNITY): Payer: Medicare Other

## 2016-05-07 ENCOUNTER — Encounter (HOSPITAL_COMMUNITY)
Admission: RE | Admit: 2016-05-07 | Discharge: 2016-05-07 | Disposition: A | Discharge status: Home / Self Care | Payer: Medicare Other | Source: Ambulatory Visit | Attending: Internal Medicine | Admitting: Internal Medicine

## 2016-05-07 DIAGNOSIS — I5032 Chronic diastolic (congestive) heart failure: Secondary | ICD-10-CM

## 2016-05-07 NOTE — Progress Notes (Signed)
Daily Session Note  Patient Details  Name: [name] MRN: [id number] Date of Birth: 09-20-1934 Referring Provider:   Flowsheet Row PULMONARY REHAB OTHER RESP ORIENTATION from 02/06/2016 in [place]  Referring Provider  Dr. Haroldine Laws      Encounter Date: 05/07/2016  Check In:     Session Check In - 05/07/16 1030      Check-In   Location [facility]   Staff Present Aundra Dubin, RN, BSN;Dequan Kindred Luther Parody, BS, EP, Exercise Physiologist   Supervising physician immediately available to respond to emergencies See telemetry face sheet for immediately available MD   Medication changes reported     No   Fall or balance concerns reported    No   Warm-up and Cool-down Performed as group-led instruction   Resistance Training Performed Yes   VAD Patient? No     Pain Assessment   Currently in Pain? No/denies   Pain Score 0-No pain   Multiple Pain Sites No      Capillary Blood Glucose: No results found for this or any previous visit (from the past 24 hour(s)).   Goals Met:  Independence with exercise equipment Exercise tolerated well No report of cardiac concerns or symptoms Strength training completed today  Goals Unmet:  Not Applicable  Comments: Check out 1145   Dr. Kate Sable is Medical Director for [place] and Pulmonary Rehab.

## 2016-05-09 ENCOUNTER — Encounter (HOSPITAL_COMMUNITY): Payer: Medicare Other

## 2016-05-09 ENCOUNTER — Encounter (HOSPITAL_COMMUNITY)
Admission: RE | Admit: 2016-05-09 | Discharge: 2016-05-09 | Disposition: A | Discharge status: Home / Self Care | Payer: Medicare Other | Source: Ambulatory Visit | Attending: Internal Medicine | Admitting: Internal Medicine

## 2016-05-09 DIAGNOSIS — I5032 Chronic diastolic (congestive) heart failure: Secondary | ICD-10-CM

## 2016-05-09 NOTE — Progress Notes (Signed)
Daily Session Note  Patient Details  Name: [name] MRN: [id number] Date of Birth: 1935/05/16 Referring Provider:   [place] from 02/06/2016 in [place]  Referring Provider  Dr. Haroldine Laws      Encounter Date: 05/09/2016  Check In:     Session Check In - 05/09/16 1045      Check-In   Location [facility]   Staff Present Diane Angelina Pih, MS, EP, [facility], Exercise Physiologist;Jafeth Mustin Luther Parody, BS, EP, Exercise Physiologist   Supervising physician immediately available to respond to emergencies See telemetry face sheet for immediately available MD   Medication changes reported     No   Fall or balance concerns reported    No   Warm-up and Cool-down Performed as group-led instruction   Resistance Training Performed Yes   VAD Patient? No     Pain Assessment   Currently in Pain? No/denies   Pain Score 0-No pain   Multiple Pain Sites No      Capillary Blood Glucose: No results found for this or any previous visit (from the past 24 hour(s)).   Goals Met:  Independence with exercise equipment Exercise tolerated well No report of cardiac concerns or symptoms Strength training completed today  Goals Unmet:  Not Applicable  Comments: Check out 1145   Dr. Kate Sable is Medical Director for [place] and Pulmonary Rehab.

## 2016-05-14 ENCOUNTER — Encounter (HOSPITAL_COMMUNITY)
Admission: RE | Admit: 2016-05-14 | Discharge: 2016-05-14 | Disposition: A | Discharge status: Home / Self Care | Payer: Medicare Other | Source: Ambulatory Visit | Attending: Internal Medicine | Admitting: Internal Medicine

## 2016-05-14 ENCOUNTER — Encounter (HOSPITAL_COMMUNITY): Payer: Medicare Other

## 2016-05-14 DIAGNOSIS — I5032 Chronic diastolic (congestive) heart failure: Secondary | ICD-10-CM

## 2016-05-14 NOTE — Progress Notes (Signed)
Daily Session Note  Patient Details  Name: RENEISHA STILLEY MRN: [id number] Date of Birth: 05-08-1935 Referring Provider:   [place] from 02/06/2016 in [place]  Referring Provider  Dr. Haroldine Laws      Encounter Date: 05/14/2016  Check In:     Session Check In - 05/14/16 1045      Check-In   Location [facility]   Staff Present Diane Angelina Pih, MS, EP, [facility], Exercise Physiologist;Zacari Radick Luther Parody, BS, EP, Exercise Physiologist   Supervising physician immediately available to respond to emergencies See telemetry face sheet for immediately available MD   Medication changes reported     No   Fall or balance concerns reported    No   Warm-up and Cool-down Performed as group-led instruction   Resistance Training Performed Yes   VAD Patient? No     Pain Assessment   Currently in Pain? No/denies   Pain Score 0-No pain   Multiple Pain Sites No      Capillary Blood Glucose: No results found for this or any previous visit (from the past 24 hour(s)).   Goals Met:  Independence with exercise equipment Exercise tolerated well No report of cardiac concerns or symptoms Strength training completed today  Goals Unmet:  Not Applicable  Comments: Check out 1145   Dr. Kate Sable is Medical Director for [place] and Pulmonary Rehab.

## 2016-05-16 ENCOUNTER — Encounter (HOSPITAL_COMMUNITY): Payer: Medicare Other

## 2016-05-16 ENCOUNTER — Encounter (HOSPITAL_COMMUNITY)
Admission: RE | Admit: 2016-05-16 | Discharge: 2016-05-16 | Disposition: A | Discharge status: Home / Self Care | Payer: Medicare Other | Source: Ambulatory Visit | Attending: Internal Medicine | Admitting: Internal Medicine

## 2016-05-16 DIAGNOSIS — I5032 Chronic diastolic (congestive) heart failure: Secondary | ICD-10-CM | POA: Diagnosis not present

## 2016-05-16 NOTE — Progress Notes (Signed)
Daily Session Note  Patient Details  Name: [name] MRN: [id number] Date of Birth: 25-Oct-1934 Referring Provider:   Flowsheet Row PULMONARY REHAB OTHER RESP ORIENTATION from 02/06/2016 in [place]  Referring Provider  Dr. Haroldine Laws      Encounter Date: 05/16/2016  Check In:     Session Check In - 05/16/16 1045      Check-In   Location [facility]   Staff Present Aundra Dubin, RN, BSN;Angelissa Supan Luther Parody, BS, EP, Exercise Physiologist   Supervising physician immediately available to respond to emergencies See telemetry face sheet for immediately available MD   Medication changes reported     No   Fall or balance concerns reported    No   Warm-up and Cool-down Performed as group-led instruction   Resistance Training Performed Yes   VAD Patient? No     Pain Assessment   Currently in Pain? No/denies   Pain Score 0-No pain   Multiple Pain Sites No      Capillary Blood Glucose: No results found for this or any previous visit (from the past 24 hour(s)).   Goals Met:  Independence with exercise equipment Exercise tolerated well No report of cardiac concerns or symptoms Strength training completed today  Goals Unmet:  Not Applicable  Comments: Check out 1145   Dr. Kate Sable is Medical Director for [place] and Pulmonary Rehab.

## 2016-05-20 ENCOUNTER — Other Ambulatory Visit (HOSPITAL_COMMUNITY): Payer: Self-pay | Admitting: *Deleted

## 2016-05-20 ENCOUNTER — Other Ambulatory Visit (HOSPITAL_COMMUNITY): Payer: Self-pay | Admitting: Internal Medicine

## 2016-05-21 ENCOUNTER — Encounter (HOSPITAL_COMMUNITY): Payer: Medicare Other

## 2016-05-21 ENCOUNTER — Encounter (HOSPITAL_COMMUNITY)
Admission: RE | Admit: 2016-05-21 | Discharge: 2016-05-21 | Disposition: A | Discharge status: Home / Self Care | Payer: Medicare Other | Source: Ambulatory Visit | Attending: Internal Medicine | Admitting: Internal Medicine

## 2016-05-21 DIAGNOSIS — I5032 Chronic diastolic (congestive) heart failure: Secondary | ICD-10-CM | POA: Insufficient documentation

## 2016-05-21 NOTE — Progress Notes (Signed)
Daily Session Note  Patient Details  Name: [name] MRN: [id number] Date of Birth: 06-01-1935 Referring Provider:   Flowsheet Row PULMONARY REHAB OTHER RESP ORIENTATION from 02/06/2016 in [place]  Referring Provider  Dr. Haroldine Laws      Encounter Date: 05/21/2016  Check In:     Session Check In - 05/21/16 1045      Check-In   Location [facility]   Staff Present Suzanne Boron, BS, EP, Exercise Physiologist   Supervising physician immediately available to respond to emergencies See telemetry face sheet for immediately available MD   Medication changes reported     No   Fall or balance concerns reported    No   Warm-up and Cool-down Performed as group-led instruction   Resistance Training Performed Yes   VAD Patient? No     Pain Assessment   Currently in Pain? No/denies   Pain Score 0-No pain   Multiple Pain Sites No      Capillary Blood Glucose: No results found for this or any previous visit (from the past 24 hour(s)).   Goals Met:  Independence with exercise equipment Exercise tolerated well No report of cardiac concerns or symptoms Strength training completed today  Goals Unmet:  Not Applicable  Comments: Check out 1145   Dr. Kate Sable is Medical Director for [place] and Pulmonary Rehab.

## 2016-05-22 ENCOUNTER — Telehealth: Payer: Self-pay | Admitting: Emergency Medicine

## 2016-05-22 NOTE — Telephone Encounter (Signed)
Spoke with pt. States that her insurance will no longer cover Letarisis. Advised her to call her insurance and find out what medications they will cover. She agreed and will call us back.

## 2016-05-23 ENCOUNTER — Encounter (HOSPITAL_COMMUNITY): Payer: Medicare Other

## 2016-05-23 ENCOUNTER — Encounter (HOSPITAL_COMMUNITY)
Admission: RE | Admit: 2016-05-23 | Discharge: 2016-05-23 | Disposition: A | Discharge status: Home / Self Care | Payer: Medicare Other | Source: Ambulatory Visit | Attending: Internal Medicine | Admitting: Internal Medicine

## 2016-05-23 DIAGNOSIS — I5032 Chronic diastolic (congestive) heart failure: Secondary | ICD-10-CM | POA: Diagnosis not present

## 2016-05-23 NOTE — Progress Notes (Signed)
Daily Session Note  Patient Details  Name: [name] MRN: [id number] Date of Birth: 1935-03-27 Referring Provider:   [place] from 02/06/2016 in [place]  Referring Provider  Dr. Haroldine Laws      Encounter Date: 05/23/2016  Check In:     Session Check In - 05/23/16 1330      Check-In   Location [facility]   Staff Present Diane Angelina Pih, MS, EP, [facility], Exercise Physiologist;Ivorie Uplinger Wynetta Emery, RN, BSN   Supervising physician immediately available to respond to emergencies See telemetry face sheet for immediately available MD   Medication changes reported     No   Fall or balance concerns reported    No   Warm-up and Cool-down Performed as group-led instruction   Resistance Training Performed Yes   VAD Patient? No     Pain Assessment   Currently in Pain? No/denies   Pain Score 0-No pain   Multiple Pain Sites No      Capillary Blood Glucose: No results found for this or any previous visit (from the past 24 hour(s)).   Goals Met:  Independence with exercise equipment Exercise tolerated well No report of cardiac concerns or symptoms Strength training completed today  Goals Unmet:  Not Applicable  Comments: Check out 1430.   Dr. Kate Sable is Medical Director for [facility] and Pulmonary Rehab.

## 2016-05-28 ENCOUNTER — Encounter (HOSPITAL_COMMUNITY)
Admission: RE | Admit: 2016-05-28 | Discharge: 2016-05-28 | Disposition: A | Discharge status: Home / Self Care | Payer: Medicare Other | Source: Ambulatory Visit | Attending: Internal Medicine | Admitting: Internal Medicine

## 2016-05-28 ENCOUNTER — Encounter (HOSPITAL_COMMUNITY): Payer: Medicare Other

## 2016-05-28 DIAGNOSIS — I5032 Chronic diastolic (congestive) heart failure: Secondary | ICD-10-CM

## 2016-05-28 NOTE — Progress Notes (Signed)
Daily Session Note  Patient Details  Name: TEQUITA MARRS MRN: [id number] Date of Birth: 11-13-1934 Referring Provider:   Flowsheet Row PULMONARY REHAB OTHER RESP ORIENTATION from 02/06/2016 in [place]  Referring Provider  Dr. Haroldine Laws      Encounter Date: 05/28/2016  Check In:     Session Check In - 05/28/16 1330      Check-In   Location [facility]   Staff Present Suzanne Boron, BS, EP, Exercise Physiologist   Supervising physician immediately available to respond to emergencies See telemetry face sheet for immediately available MD   Medication changes reported     No   Fall or balance concerns reported    No   Warm-up and Cool-down Performed as group-led instruction   Resistance Training Performed Yes   VAD Patient? No     Pain Assessment   Currently in Pain? No/denies   Pain Score 0-No pain   Multiple Pain Sites No      Capillary Blood Glucose: No results found for this or any previous visit (from the past 24 hour(s)).   Goals Met:  Independence with exercise equipment Exercise tolerated well No report of cardiac concerns or symptoms Strength training completed today  Goals Unmet:  Not Applicable  Comments: Check out 230   Dr. Kate Sable is Medical Director for [place] and Pulmonary Rehab.

## 2016-05-28 NOTE — Telephone Encounter (Signed)
Spoke with pt. She states that she contacted her insurance company. They advised her that they will cover Letairsis. Nothing further was needed at this time.

## 2016-05-30 ENCOUNTER — Encounter (HOSPITAL_COMMUNITY)
Admission: RE | Admit: 2016-05-30 | Discharge: 2016-05-30 | Disposition: A | Discharge status: Home / Self Care | Payer: Medicare Other | Source: Ambulatory Visit | Attending: Internal Medicine | Admitting: Internal Medicine

## 2016-05-30 ENCOUNTER — Encounter (HOSPITAL_COMMUNITY): Payer: Medicare Other

## 2016-05-30 DIAGNOSIS — I5032 Chronic diastolic (congestive) heart failure: Secondary | ICD-10-CM

## 2016-05-30 NOTE — Progress Notes (Signed)
Daily Session Note  Patient Details  Name: [name] MRN: [id number] Date of Birth: November 13, 1934 Referring Provider:   Flowsheet Row PULMONARY REHAB OTHER RESP ORIENTATION from 02/06/2016 in [place]  Referring Provider  Dr. Haroldine Laws      Encounter Date: 05/30/2016  Check In:     Session Check In - 05/30/16 1330      Check-In   Location [facility]   Staff Present Suzanne Boron, BS, EP, Exercise Physiologist   Supervising physician immediately available to respond to emergencies See telemetry face sheet for immediately available MD   Medication changes reported     No   Fall or balance concerns reported    No   Warm-up and Cool-down Performed as group-led instruction   Resistance Training Performed Yes   VAD Patient? No     Pain Assessment   Currently in Pain? No/denies   Pain Score 0-No pain   Multiple Pain Sites No      Capillary Blood Glucose: No results found for this or any previous visit (from the past 24 hour(s)).      Exercise Prescription Changes - 05/29/16 1400      Exercise Review   Progression Yes     Response to Exercise   Blood Pressure (Admit) 106/50   Blood Pressure (Exercise) 130/66   Blood Pressure (Exit) 96/50   Heart Rate (Admit) 90 bpm   Heart Rate (Exercise) 70 bpm   Heart Rate (Exit) 87 bpm   Oxygen Saturation (Admit) 94 %   Oxygen Saturation (Exercise) 92 %   Oxygen Saturation (Exit) 90 %   Rating of Perceived Exertion (Exercise) 13   Perceived Dyspnea (Exercise) 11   Duration Progress to 30 minutes of continuous aerobic without signs/symptoms of physical distress   Intensity Rest + 20     Progression   Progression Continue to progress workloads to maintain intensity without signs/symptoms of physical distress.     Resistance Training   Training Prescription Yes   Weight 3   Reps 10-12     NuStep   Level 3   Watts 17   Minutes 20   METs 3.64     Arm Ergometer   Level 3    Watts 22   Minutes 20   METs 2.6     Home Exercise Plan   Plans to continue exercise at Home   Frequency Add 2 additional days to program exercise sessions.     Goals Met:  Independence with exercise equipment Exercise tolerated well No report of cardiac concerns or symptoms Strength training completed today  Goals Unmet:  Not Applicable  Comments: Check out 230   Dr. Kate Sable is Medical Director for [place] and Pulmonary Rehab.

## 2016-06-04 ENCOUNTER — Encounter (HOSPITAL_COMMUNITY): Payer: Medicare Other

## 2016-06-05 DIAGNOSIS — M5412 Radiculopathy, cervical region: Secondary | ICD-10-CM | POA: Diagnosis not present

## 2016-06-05 DIAGNOSIS — Z6837 Body mass index (BMI) 37.0-37.9, adult: Secondary | ICD-10-CM | POA: Diagnosis not present

## 2016-06-05 DIAGNOSIS — M47812 Spondylosis without myelopathy or radiculopathy, cervical region: Secondary | ICD-10-CM | POA: Diagnosis not present

## 2016-06-05 DIAGNOSIS — Z1389 Encounter for screening for other disorder: Secondary | ICD-10-CM | POA: Diagnosis not present

## 2016-06-05 DIAGNOSIS — I4891 Unspecified atrial fibrillation: Secondary | ICD-10-CM | POA: Diagnosis not present

## 2016-06-05 DIAGNOSIS — E669 Obesity, unspecified: Secondary | ICD-10-CM | POA: Diagnosis not present

## 2016-06-05 DIAGNOSIS — J449 Chronic obstructive pulmonary disease, unspecified: Secondary | ICD-10-CM | POA: Diagnosis not present

## 2016-06-05 DIAGNOSIS — M779 Enthesopathy, unspecified: Secondary | ICD-10-CM | POA: Diagnosis not present

## 2016-06-05 DIAGNOSIS — M1991 Primary osteoarthritis, unspecified site: Secondary | ICD-10-CM | POA: Diagnosis not present

## 2016-06-06 ENCOUNTER — Encounter (HOSPITAL_COMMUNITY): Payer: Medicare Other

## 2016-06-11 ENCOUNTER — Encounter (HOSPITAL_COMMUNITY): Payer: Medicare Other

## 2016-06-12 DIAGNOSIS — Z6836 Body mass index (BMI) 36.0-36.9, adult: Secondary | ICD-10-CM | POA: Diagnosis not present

## 2016-06-12 DIAGNOSIS — I10 Essential (primary) hypertension: Secondary | ICD-10-CM | POA: Diagnosis not present

## 2016-06-12 DIAGNOSIS — J309 Allergic rhinitis, unspecified: Secondary | ICD-10-CM | POA: Diagnosis not present

## 2016-06-12 DIAGNOSIS — M7712 Lateral epicondylitis, left elbow: Secondary | ICD-10-CM | POA: Diagnosis not present

## 2016-06-12 DIAGNOSIS — Z23 Encounter for immunization: Secondary | ICD-10-CM | POA: Diagnosis not present

## 2016-06-13 ENCOUNTER — Encounter (HOSPITAL_COMMUNITY): Payer: Medicare Other

## 2016-06-13 ENCOUNTER — Encounter (HOSPITAL_COMMUNITY)
Admission: RE | Admit: 2016-06-13 | Discharge: 2016-06-13 | Disposition: A | Discharge status: Home / Self Care | Payer: Medicare Other | Source: Ambulatory Visit | Attending: Internal Medicine | Admitting: Internal Medicine

## 2016-06-13 DIAGNOSIS — I5032 Chronic diastolic (congestive) heart failure: Secondary | ICD-10-CM

## 2016-06-13 NOTE — Progress Notes (Signed)
Daily Session Note  Patient Details  Name: [name] MRN: [id number] Date of Birth: 08/05/1935 Referring Provider:   Flowsheet Row PULMONARY REHAB OTHER RESP ORIENTATION from 02/06/2016 in [place]  Referring Provider  Dr. Haroldine Laws      Encounter Date: 06/13/2016  Check In:     Session Check In - 06/13/16 1330      Check-In   Location [facility]   Staff Present Aundra Dubin, RN, BSN;Serra Younan Luther Parody, BS, EP, Exercise Physiologist   Supervising physician immediately available to respond to emergencies See telemetry face sheet for immediately available MD   Medication changes reported     No   Fall or balance concerns reported    No   Warm-up and Cool-down Performed as group-led instruction   Resistance Training Performed Yes   VAD Patient? No     Pain Assessment   Currently in Pain? No/denies   Pain Score 0-No pain   Multiple Pain Sites No      Capillary Blood Glucose: No results found for this or any previous visit (from the past 24 hour(s)).   Goals Met:  Independence with exercise equipment Exercise tolerated well No report of cardiac concerns or symptoms Strength training completed today  Goals Unmet:  Not Applicable  Comments: Check out 230   Dr. Kate Sable is Medical Director for [place] and Pulmonary Rehab.

## 2016-06-17 ENCOUNTER — Telehealth: Payer: Self-pay | Admitting: Emergency Medicine

## 2016-06-17 ENCOUNTER — Encounter: Payer: Self-pay | Admitting: Emergency Medicine

## 2016-06-17 ENCOUNTER — Ambulatory Visit (INDEPENDENT_AMBULATORY_CARE_PROVIDER_SITE_OTHER): Payer: Medicare Other | Admitting: Emergency Medicine

## 2016-06-17 DIAGNOSIS — I272 Pulmonary hypertension, unspecified: Secondary | ICD-10-CM | POA: Diagnosis not present

## 2016-06-17 DIAGNOSIS — J300 Vasomotor rhinitis: Secondary | ICD-10-CM | POA: Diagnosis not present

## 2016-06-17 DIAGNOSIS — I2510 Atherosclerotic heart disease of native coronary artery without angina pectoris: Secondary | ICD-10-CM

## 2016-06-17 NOTE — Assessment & Plan Note (Signed)
She has failed nasal steroid, failed antihistamines. She believes that her current nasal spray is helping her some. She cannot recall the name.  Please call us to let us know which nasal spray you are currently using

## 2016-06-17 NOTE — Progress Notes (Signed)
Subjective:    Patient ID: [name], female    DOB: November 06, 1934, 81 y.o.   MRN: [id number]  HPI 81 year old woman with a very remote and small tobacco history, history of hypertension, atrial fibrillation, coronary artery disease, psoriasis. She has secondary multifactorial pulmonary hypertension that has been presumed related to obesity with chronic hypoxemia, left-sided heart disease and presumed chronic thromboembolic disease. As noted she does also have a history of autoimmune process, psoriasis.  She is not on any targeted [place] therapy. Last seen by Dr [name] here in February 2015. She had a CT scan of the chest 10/05/13 that showed pulmonary nodules, the 2 most dominant were in the left upper lobe, and a subsequent PET scan on 04/11/14 showed that these were not hypermetabolic. I have personally reviewed the scans as well as a repeat CT scan done on 09/27/14 that showed no interval change. She is due for a final scan to confirm stability in February 2017. She underwent a polysomnogram in November 2013 with an AHI of 4.1 and RDI of 5.9.   She reports no SOB, is able to exert. Is able to do housework. She recently had a URI and cough, took 8 weeks to get over the cough. Was treated with pred and abx.   ROV 10/09/15 -- follow-up visit. She has a history of psoriasis, hypertension, atrial fibrillation. He also carries a history of presumed chronic thromboembolic disease. In evaluation for obstructive sleep apnea showed an AHI of 4.1. He has secondary pulmonary hypertension that has been deemed multifactorial. She underwent a repeat CT scan of her chest this month that I have personally reviewed. This showed that his pulmonary nodules are stable to smaller in size. She also had an echocardiogram on 08/30/15 that showed an estimated PASP of 70 mmHg and mild RV dysfunction.  She has occasional SOB but seldom, denies limitations. She uses a walker for balance.    ROV 02/08/16 -- [name] has a  history of hypertension and diastolic dysfunction, atrial fibrillation, psoriasis, and presumed chronic thromboembolic disease (never definitively proven, suggested by VQ scan September 2012). She has multifactorial pulmonary hypertension in the setting of all of this. I started her on adcirca and letaris in March which seem to exacerbate edema, volume overload and finally an exacerbation of her diastolic CHF. She had to be hospitalized for this and her diuretics were adjusted. Note she also has a history of pulmonary nodules which we have followed w serial scans and which are likely benign. She believes that she is much better compensated at this time, now on torsemide instead of furosemide. She is on Eliquis. She is on O2 at night. She is on flonase - still w some congestion. Occasional cough. She feels that she is a bit more active, denies dyspnea.   ROV 06/17/16 -- this is a follow-up visit for patient with a history of multifactorial secondary pulmonary hypertension. His outlined above she has hypertension, diastolic dysfunction, atrial fibrillation, psoriasis and suspected thromboembolic disease. We started her on adcirca and letaris. She could not tolerate the adcirca, but she is on the letaris. She is on eliquis. She is doing cardiopulm rehab and is planning to enter the maintenance program. She has occasional B leg burning, not associated with edema, can be associated w exercise.  She is having congestion and drainage. She has been on flonase before but not now, is currently taking another spray.  She has failed loratadine, zyrtec etc.    Right heart cath  12/07/15 RA = 10 RV = 58/2/9  PA = 59/17 (36) PCW = 16 (v wave to 30-35) Fick cardiac output/index = 5.2/2.6 PVR = 3.9 WU Ao sat = 97% PA sat = 65%, 66%   Review of Systems  Constitutional: Negative for fever and unexpected weight change.  HENT: Negative for congestion, dental problem, ear pain, nosebleeds, postnasal drip, rhinorrhea,  sinus pressure, sneezing, sore throat and trouble swallowing.   Eyes: Negative for redness and itching.  Respiratory: Negative for cough, chest tightness, shortness of breath and wheezing.   Cardiovascular: Negative for palpitations and leg swelling.  Gastrointestinal: Negative for nausea and vomiting.  Genitourinary: Negative for dysuria.  Musculoskeletal: Negative for joint swelling.  Skin: Negative for rash.  Neurological: Negative for headaches.  Hematological: Does not bruise/bleed easily.  Psychiatric/Behavioral: Negative for dysphoric mood. The patient is not nervous/anxious.     Past Medical History:  Diagnosis Date  . A-fib ([place])   . Arthritis    "knees" (11/17/2015)  . CAD (coronary artery disease)    BMS to LAD 07/2005  . Chronic atrial fibrillation ([place])    Since 2011  . Degenerative arthritis of right knee   . Essential hypertension   . GERD (gastroesophageal reflux disease)   . Hyperlipidemia   . Morbid obesity ([place])   . Myocardial infarction 07/2005  . On home oxygen therapy    "2.5L at night" (11/17/2015)  . Psoriasis   . Pulmonary hypertension    Mixed, PASP 67 mmHg at [facility] 2013  . Right ventricular dysfunction   . Tricuspid regurgitation    Severe     Family History  Problem Relation Age of Onset  . Heart attack Mother   . Coronary artery disease Father   . Stroke Father   . Heart attack Brother   . Heart failure Brother   . COPD Brother   . Emphysema Sister   . Emphysema Brother      Social History   Social History  . Marital status: Married    Spouse name: N/A  . Number of children: 2  . Years of education: N/A   Occupational History  . retired Retired   Social History Main Topics  . Smoking status: Former Smoker    Packs/day: 0.30    Years: 0.50    Types: Cigarettes    Quit date: 08/19/1986  . Smokeless tobacco: Never Used     Comment: pt only smoked for only a few months.  . Alcohol use No  . Drug use: No  . Sexual activity: No     Other Topics Concern  . Not on file   Social History Narrative  . No narrative on file     No Known Allergies   Outpatient Medications Prior to Visit  Medication Sig Dispense Refill  . ALPRAZolam (XANAX) 0.5 MG tablet Take 0.5 mg by mouth at bedtime.     [name] alum hydroxide-mag trisilicate (GAVISCON) [id number] MG CHEW chewable tablet Chew 1 tablet by mouth 3 (three) times daily as needed for indigestion or heartburn.    [name] ambrisentan (LETAIRIS) 5 MG tablet Take 1 tablet (5 mg total) by mouth daily. 20 tablet 0  . apixaban (ELIQUIS) 5 MG TABS tablet Take 1 tablet (5 mg total) by mouth 2 (two) times daily. 60 tablet 3  . atorvastatin (LIPITOR) 80 MG tablet TAKE 1/2 TABLET BY MOUTH DAILY 45 tablet 2  . AZOPT 1 % ophthalmic suspension Place 1 drop into both eyes 3 (three) times  daily.     . brimonidine (ALPHAGAN) 0.2 % ophthalmic solution Place 1 drop into both eyes 2 (two) times daily.     . Coenzyme Q10 (COQ10) 200 MG CAPS Take 200 mg by mouth daily.    . colchicine 0.6 MG tablet Take 0.6 mg by mouth daily.    . fluticasone (FLONASE) 50 MCG/ACT nasal spray Place 2 sprays into both nostrils daily.     [name] HYDROcodone-acetaminophen (NORCO/VICODIN) 5-325 MG tablet Take 1 tablet by mouth 2 (two) times daily as needed for moderate pain.     [name] latanoprost (XALATAN) 0.005 % ophthalmic solution Place 1 drop into the right eye at bedtime.     . lidocaine (XYLOCAINE) 5 % ointment Apply 1 application topically daily as needed (knee pain).     [name] losartan (COZAAR) 100 MG tablet Take 100 mg by mouth every evening.    . nitroGLYCERIN (NITROSTAT) 0.4 MG SL tablet Place 0.4 mg under the tongue every 5 (five) minutes as needed for chest pain (x 3 tablets daily). Reported on 12/28/2015    . ondansetron (ZOFRAN ODT) 8 MG disintegrating tablet Take 1 tablet (8 mg total) by mouth every 8 (eight) hours as needed for nausea or vomiting. 10 tablet 0  . pantoprazole (PROTONIX) 20 MG tablet Take 20 mg by mouth daily.  Reported on 02/10/2016    . potassium chloride SA (K-DUR,KLOR-CON) 20 MEQ tablet Take 1 tablet (20 mEq total) by mouth daily. 30 tablet 3  . Probiotic Product (PROBIOTIC PO) Take 1 tablet by mouth 2 (two) times daily.    [name] spironolactone (ALDACTONE) 25 MG tablet TAKE 1/2 TABLET BY MOUTH EVERY DAY 15 tablet 3  . timolol (TIMOPTIC) 0.5 % ophthalmic solution Place 1 drop into both eyes 2 (two) times daily.    [name] torsemide (DEMADEX) 20 MG tablet TAKE 2 TABLETS BY MOUTH EVERY DAY 180 tablet 3  . tadalafil, PAH, (ADCIRCA) 20 MG tablet Take 2 tablets (40 mg total) by mouth daily. 60 tablet 5   No facility-administered medications prior to visit.          Objective:   Physical Exam Vitals:   06/17/16 1019  BP: 106/64  Pulse: 60  SpO2: 94%  Weight: 204 lb (92.5 kg)  Height: 5\' 3"  (1.6 m)   Gen: Pleasant, well-nourished, in no distress,  normal affect  ENT: No lesions,  mouth clear,  oropharynx clear, no postnasal drip  Neck: No JVD, no TMG, no carotid bruits  Lungs: No use of accessory muscles, no dullness to percussion, clear without rales or rhonchi  Cardiovascular: RRR, heart sounds normal, no murmur or gallops, no peripheral edema  Musculoskeletal: No deformities, no cyanosis or clubbing  Neuro: alert, non focal  Skin: Warm, no lesions or rashes     CT chest 10/03/15 --  COMPARISON: 09/27/2014  FINDINGS: Left upper lobe pulmonary nodule on image 21 measures 7.5 mm compared to 6.5 mm previously and 7.5 mm and 2015.  Anterior left upper lobe nodule on image 24 measures up to 9.5 mm compared with 9.5 mm previously.  Small left lower lobe pulmonary nodule peripherally on image 32 is stable. Left lower lobe pulmonary nodule on image 36 measures 6 mm compared with 8 mm previously.  Right lower lobe pulmonary nodule posteriorly on image 44 measures 4 mm and is stable.  No new pulmonary nodule. Heart is enlarged. Diffuse coronary artery calcifications. Pulmonary  arteries are enlarged suggesting pulmonary arterial hypertension, stable. No mediastinal, hilar, or axillary  adenopathy. Chest wall soft tissues are unremarkable. Imaging into the upper abdomen shows no acute findings.  No acute bony abnormality or focal bone lesion. Leftward scoliosis and degenerative changes in the thoracic spine.  IMPRESSION: Stable or slightly smaller bilateral pulmonary nodules. No new or enlarging pulmonary nodules. These are stable dating back to February of 2015 and are compatible with benign nodules.  Cardiomegaly, pulmonary arterial hypertension.  Coronary artery disease.      Assessment & Plan:  Pulmonary hypertension Please continue your Letaris and eliquis as you are taking them Continue oxygen at night Continue cardiopulmonary rehabilitation as you have been doing it. I agree with entering the maintenance program. We will repeat your echocardiogram next spring to compare with your priors.. Follow with Dr Haroldine Laws as planned.  Follow with Dr Lamonte Sakai in 6 months or sooner if you have any problems  Chronic rhinitis She has failed nasal steroid, failed antihistamines. She believes that her current nasal spray is helping her some. She cannot recall the name.  Please call us to let us know which nasal spray you are currently using  Baltazar Apo, MD, PhD 06/17/2016, 10:45 AM [facility] [phone] or if no answer [phone]

## 2016-06-17 NOTE — Patient Instructions (Signed)
Please continue your Letaris and eliquis as you are taking them Continue oxygen at night Continue cardiopulmonary rehabilitation as you have been doing it. I agree with entering the maintenance program. We will repeat your echocardiogram next spring to compare with your priors. Please call us to let us know which nasal spray you are currently using. Follow with Dr Haroldine Laws as planned.  Follow with Dr Lamonte Sakai in 6 months or sooner if you have any problems

## 2016-06-17 NOTE — Telephone Encounter (Signed)
FYI: Spoke with pt and she clarified that she is using Astelin nasal spray.

## 2016-06-17 NOTE — Telephone Encounter (Signed)
Thanks

## 2016-06-17 NOTE — Assessment & Plan Note (Signed)
Please continue your Letaris and eliquis as you are taking them Continue oxygen at night Continue cardiopulmonary rehabilitation as you have been doing it. I agree with entering the maintenance program. We will repeat your echocardiogram next spring to compare with your priors.. Follow with Dr Haroldine Laws as planned.  Follow with Dr Lamonte Sakai in 6 months or sooner if you have any problems

## 2016-06-18 ENCOUNTER — Encounter (HOSPITAL_COMMUNITY)
Admission: RE | Admit: 2016-06-18 | Discharge: 2016-06-18 | Disposition: A | Discharge status: Home / Self Care | Payer: Medicare Other | Source: Ambulatory Visit | Attending: Internal Medicine | Admitting: Internal Medicine

## 2016-06-18 DIAGNOSIS — I5032 Chronic diastolic (congestive) heart failure: Secondary | ICD-10-CM

## 2016-06-18 NOTE — Progress Notes (Signed)
Daily Session Note  Patient Details  Name: Kymberlee L Meaders MRN: [id number] Date of Birth: 07/01/1935 Referring Provider:   Flowsheet Row PULMONARY REHAB OTHER RESP ORIENTATION from 02/06/2016 in [place] C[facility]  Referring Provider  Dr. Bensimhon      Encounter Date: 06/18/2016  Check In:     Session Check In - 06/18/16 1045      Check-In   Location [facility]   Staff Present Rozella Servello, BS, EP, Exercise Physiologist   Supervising physician immediately available to respond to emergencies See telemetry face sheet for immediately available MD   Medication changes reported     No   Fall or balance concerns reported    No   Warm-up and Cool-down Performed as group-led instruction   Resistance Training Performed Yes   VAD Patient? No     Pain Assessment   Currently in Pain? No/denies   Pain Score 0-No pain   Multiple Pain Sites No      Capillary Blood Glucose: No results found for this or any previous visit (from the past 24 hour(s)).   Goals Met:  Independence with exercise equipment Exercise tolerated well No report of cardiac concerns or symptoms Strength training completed today  Goals Unmet:  Not Applicable  Comments: Check out 1145   Dr. Suresh Koneswaran is Medical Director for [facility]. 

## 2016-06-20 ENCOUNTER — Encounter (HOSPITAL_COMMUNITY)
Admission: RE | Admit: 2016-06-20 | Discharge: 2016-06-20 | Disposition: A | Discharge status: Home / Self Care | Payer: Medicare Other | Source: Ambulatory Visit | Attending: Internal Medicine | Admitting: Internal Medicine

## 2016-06-20 DIAGNOSIS — I5032 Chronic diastolic (congestive) heart failure: Secondary | ICD-10-CM | POA: Insufficient documentation

## 2016-06-20 NOTE — Progress Notes (Signed)
Daily Session Note  Patient Details  Name: [name] MRN: [id number] Date of Birth: Dec 19, 1934 Referring Provider:   Flowsheet Row PULMONARY REHAB OTHER RESP ORIENTATION from 02/06/2016 in [place]  Referring Provider  Dr. Haroldine Laws      Encounter Date: 06/20/2016  Check In:     Session Check In - 06/20/16 1045      Check-In   Location [facility]   Staff Present Aundra Dubin, RN, BSN;Trevion Hoben Luther Parody, BS, EP, Exercise Physiologist   Supervising physician immediately available to respond to emergencies See telemetry face sheet for immediately available MD   Medication changes reported     No   Fall or balance concerns reported    No   Warm-up and Cool-down Performed as group-led instruction   Resistance Training Performed Yes   VAD Patient? No     Pain Assessment   Currently in Pain? No/denies   Pain Score 0-No pain   Multiple Pain Sites No      Capillary Blood Glucose: No results found for this or any previous visit (from the past 24 hour(s)).   Goals Met:  Independence with exercise equipment Exercise tolerated well No report of cardiac concerns or symptoms Strength training completed today  Goals Unmet:  Not Applicable  Comments: Check out 1145   Dr. Sinda Du is Medical Director for [facility].

## 2016-06-25 ENCOUNTER — Encounter (HOSPITAL_COMMUNITY)
Admission: RE | Admit: 2016-06-25 | Discharge: 2016-06-25 | Disposition: A | Discharge status: Home / Self Care | Payer: Medicare Other | Source: Ambulatory Visit | Attending: Internal Medicine | Admitting: Internal Medicine

## 2016-06-25 DIAGNOSIS — I5032 Chronic diastolic (congestive) heart failure: Secondary | ICD-10-CM | POA: Diagnosis not present

## 2016-06-25 NOTE — Progress Notes (Signed)
Daily Session Note  Patient Details  Name: [name] MRN: [id number] Date of Birth: [birth year] Referring Provider:   [place] from 02/06/2016 in [place]  Referring Provider  Dr. Haroldine Laws      Encounter Date: 06/25/2016  Check In:     Session Check In - 06/25/16 1045      Check-In   Location [facility]   Staff Present Russella Dar, MS, EP, [facility], Exercise Physiologist;Wai Litt Luther Parody, BS, EP, Exercise Physiologist   Supervising physician immediately available to respond to emergencies See telemetry face sheet for immediately available MD   Medication changes reported     No   Fall or balance concerns reported    No   Warm-up and Cool-down Performed as group-led instruction   Resistance Training Performed Yes   VAD Patient? No     Pain Assessment   Currently in Pain? No/denies   Pain Score 0-No pain   Multiple Pain Sites No      Capillary Blood Glucose: No results found for this or any previous visit (from the past 24 hour(s)).   Goals Met:  Independence with exercise equipment Exercise tolerated well No report of cardiac concerns or symptoms Strength training completed today  Goals Unmet:  Not Applicable  Comments: Check out 1145   Dr. Kate Sable is Medical Director for [facility] and Pulmonary Rehab.

## 2016-06-27 ENCOUNTER — Encounter (HOSPITAL_COMMUNITY)
Admission: RE | Admit: 2016-06-27 | Discharge: 2016-06-27 | Disposition: A | Discharge status: Home / Self Care | Payer: Medicare Other | Source: Ambulatory Visit | Attending: Internal Medicine | Admitting: Internal Medicine

## 2016-06-27 DIAGNOSIS — I5032 Chronic diastolic (congestive) heart failure: Secondary | ICD-10-CM

## 2016-06-27 NOTE — Progress Notes (Signed)
Daily Session Note  Patient Details  Name: [name] MRN: [id number] Date of Birth: 1934/12/05 Referring Provider:   Flowsheet Row PULMONARY REHAB OTHER RESP ORIENTATION from 02/06/2016 in [place]  Referring Provider  Dr. Haroldine Laws      Encounter Date: 06/27/2016  Check In:     Session Check In - 06/27/16 1045      Check-In   Location [facility]   Staff Present Suzanne Boron, BS, EP, Exercise Physiologist   Supervising physician immediately available to respond to emergencies See telemetry face sheet for immediately available MD   Medication changes reported     No   Fall or balance concerns reported    No   Warm-up and Cool-down Performed as group-led instruction   Resistance Training Performed Yes   VAD Patient? No     Pain Assessment   Currently in Pain? No/denies   Pain Score 0-No pain   Multiple Pain Sites No      Capillary Blood Glucose: No results found for this or any previous visit (from the past 24 hour(s)).   Goals Met:  Independence with exercise equipment Exercise tolerated well No report of cardiac concerns or symptoms Strength training completed today  Goals Unmet:  Not Applicable  Comments: Check out 1145   Dr. Kate Sable is Medical Director for [facility] and Pulmonary Rehab.

## 2016-07-01 ENCOUNTER — Other Ambulatory Visit (HOSPITAL_COMMUNITY): Payer: Self-pay | Admitting: Internal Medicine

## 2016-07-02 ENCOUNTER — Encounter (HOSPITAL_COMMUNITY)
Admission: RE | Admit: 2016-07-02 | Discharge: 2016-07-02 | Disposition: A | Discharge status: Home / Self Care | Payer: Medicare Other | Source: Ambulatory Visit | Attending: Internal Medicine | Admitting: Internal Medicine

## 2016-07-02 VITALS — Ht 63.0 in | Wt 206.4 lb

## 2016-07-02 DIAGNOSIS — I5032 Chronic diastolic (congestive) heart failure: Secondary | ICD-10-CM | POA: Diagnosis not present

## 2016-07-02 NOTE — Progress Notes (Signed)
Daily Session Note  Patient Details  Name: [name] MRN: [id number] Date of Birth: June 18, 1935 Referring Provider:   Flowsheet Row PULMONARY REHAB OTHER RESP ORIENTATION from 02/06/2016 in [place]  Referring Provider  Dr. Haroldine Laws      Encounter Date: 07/02/2016  Check In:     Session Check In - 07/02/16 1045      Check-In   Location [facility]   Staff Present Suzanne Boron, BS, EP, Exercise Physiologist   Supervising physician immediately available to respond to emergencies See telemetry face sheet for immediately available MD   Medication changes reported     No   Fall or balance concerns reported    No   Warm-up and Cool-down Performed as group-led instruction   Resistance Training Performed Yes   VAD Patient? No     Pain Assessment   Currently in Pain? No/denies   Pain Score 0-No pain   Multiple Pain Sites No      Capillary Blood Glucose: No results found for this or any previous visit (from the past 24 hour(s)).   Goals Met:  Independence with exercise equipment Improved SOB with ADL's Using PLB without cueing & demonstrates good technique No report of cardiac concerns or symptoms Strength training completed today  Goals Unmet:  Not Applicable  Comments: Check out 1145   Dr. Sinda Du is Medical Director for [facility].

## 2016-07-03 NOTE — Progress Notes (Signed)
Pulmonary Individual Treatment Plan  Patient Details  Name: [name] MRN: [id number] Date of Birth: [birth year] Referring Provider:   Flowsheet Row PULMONARY REHAB OTHER RESP ORIENTATION from 02/06/2016 in [facility]  Referring Provider  Dr. Haroldine Laws      Initial Encounter Date:  Flowsheet Row PULMONARY REHAB OTHER RESP ORIENTATION from 02/06/2016 in [facility]  Date  02/06/16  Referring Provider  Dr. Haroldine Laws      Visit Diagnosis: Diastolic CHF, chronic ([place])  Patient's Home Medications on Admission:   Current Outpatient Prescriptions:  .  ALPRAZolam (XANAX) 0.5 MG tablet, Take 0.5 mg by mouth at bedtime. , Disp: , Rfl:  .  alum hydroxide-mag trisilicate (GAVISCON) [id number] MG CHEW chewable tablet, Chew 1 tablet by mouth 3 (three) times daily as needed for indigestion or heartburn., Disp: , Rfl:  .  ambrisentan (LETAIRIS) 5 MG tablet, Take 1 tablet (5 mg total) by mouth daily., Disp: 20 tablet, Rfl: 0 .  apixaban (ELIQUIS) 5 MG TABS tablet, Take 1 tablet (5 mg total) by mouth 2 (two) times daily., Disp: 60 tablet, Rfl: 3 .  atorvastatin (LIPITOR) 80 MG tablet, TAKE 1/2 TABLET BY MOUTH DAILY, Disp: 45 tablet, Rfl: 2 .  AZOPT 1 % ophthalmic suspension, Place 1 drop into both eyes 3 (three) times daily. , Disp: , Rfl:  .  brimonidine (ALPHAGAN) 0.2 % ophthalmic solution, Place 1 drop into both eyes 2 (two) times daily. , Disp: , Rfl:  .  Coenzyme Q10 (COQ10) 200 MG CAPS, Take 200 mg by mouth daily., Disp: , Rfl:  .  colchicine 0.6 MG tablet, Take 0.6 mg by mouth daily., Disp: , Rfl:  .  fluticasone (FLONASE) 50 MCG/ACT nasal spray, Place 2 sprays into both nostrils daily. , Disp: , Rfl:  .  HYDROcodone-acetaminophen (NORCO/VICODIN) 5-325 MG tablet, Take 1 tablet by mouth 2 (two) times daily as needed for moderate pain. , Disp: , Rfl:  .  latanoprost (XALATAN) 0.005 % ophthalmic solution, Place 1 drop into the right eye at bedtime. ,  Disp: , Rfl:  .  lidocaine (XYLOCAINE) 5 % ointment, Apply 1 application topically daily as needed (knee pain). , Disp: , Rfl:  .  losartan (COZAAR) 100 MG tablet, Take 100 mg by mouth every evening., Disp: , Rfl:  .  nitroGLYCERIN (NITROSTAT) 0.4 MG SL tablet, Place 0.4 mg under the tongue every 5 (five) minutes as needed for chest pain (x 3 tablets daily). Reported on 12/28/2015, Disp: , Rfl:  .  ondansetron (ZOFRAN ODT) 8 MG disintegrating tablet, Take 1 tablet (8 mg total) by mouth every 8 (eight) hours as needed for nausea or vomiting., Disp: 10 tablet, Rfl: 0 .  pantoprazole (PROTONIX) 20 MG tablet, Take 20 mg by mouth daily. Reported on 02/10/2016, Disp: , Rfl:  .  potassium chloride SA (K-DUR,KLOR-CON) 20 MEQ tablet, TAKE 1 TABLET BY MOUTH EVERY DAY, Disp: 30 tablet, Rfl: 3 .  Probiotic Product (PROBIOTIC PO), Take 1 tablet by mouth 2 (two) times daily., Disp: , Rfl:  .  spironolactone (ALDACTONE) 25 MG tablet, TAKE 1/2 TABLET BY MOUTH EVERY DAY, Disp: 15 tablet, Rfl: 3 .  timolol (TIMOPTIC) 0.5 % ophthalmic solution, Place 1 drop into both eyes 2 (two) times daily., Disp: , Rfl:  .  torsemide (DEMADEX) 20 MG tablet, TAKE 2 TABLETS BY MOUTH EVERY DAY, Disp: 180 tablet, Rfl: 3  Past Medical History: Past Medical History:  Diagnosis Date  . A-fib ([place])   .  Arthritis    "knees" (11/17/2015)  . CAD (coronary artery disease)    BMS to LAD 07/2005  . Chronic atrial fibrillation ([place])    Since 2011  . Degenerative arthritis of right knee   . Essential hypertension   . GERD (gastroesophageal reflux disease)   . Hyperlipidemia   . Morbid obesity ([place])   . Myocardial infarction 07/2005  . On home oxygen therapy    "2.5L at night" (11/17/2015)  . Psoriasis   . Pulmonary hypertension    Mixed, PASP 67 mmHg at [facility] 2013  . Right ventricular dysfunction   . Tricuspid regurgitation    Severe    Tobacco Use: History  Smoking Status  . Former Smoker  . Packs/day: 0.30  . Years: 0.50  .  Types: Cigarettes  . Quit date: 08/19/1986  Smokeless Tobacco  . Never Used    Comment: pt only smoked for only a few months.    Labs: Recent Review Flowsheet Data    Labs for ITP Cardiac and Pulmonary Rehab Latest Ref Rng & Units 08/04/2012 08/04/2012 08/04/2012 12/07/2015 12/07/2015   Cholestrol 0 - 200 mg/dL - - - - -   LDLCALC 0 - 99 mg/dL - - - - -   HDL >39.00 mg/dL - - - - -   Trlycerides 0.0 - 149.0 mg/dL - - - - -   PHART 7.350 - 7.450 7.379 - - - -   PCO2ART 35.0 - 45.0 mmHg 39.6 - - - -   HCO3 20.0 - 24.0 mEq/L 23.4 24.9(H) 24.2(H) 29.5(H) 28.7(H)   TCO2 0 - 100 mmol/L 25 26 25 31 30    ACIDBASEDEF 0.0 - 2.0 mmol/L 2.0 - 1.0 - -   O2SAT % 94.0 61.0 61.0 66.0 65.0      Capillary Blood Glucose: Lab Results  Component Value Date   GLUCAP 97 04/09/2014     ADL UCSD:     Pulmonary Assessment Scores    Row Name 02/06/16 1712         ADL UCSD   ADL Phase Entry     SOB Score total 50     Rest 0     Walk 6     Stairs 3     Bath 2     Dress 2     Shop 3       CAT Score   CAT Score 16       mMRC Score   mMRC Score 2        Pulmonary Function Assessment:     Pulmonary Function Assessment - 02/06/16 1709      Pulmonary Function Tests   FVC% 46 %   FEV1% 47 %   FEV1/FVC Ratio 74   RV% 98 %   DLCO% 27 %     Initial Spirometry Results   FVC% 1.31 %   FEV1% 0.93 %   FEV1/FVC Ratio 71     Post Bronchodilator Spirometry Results   FVC% 1.26 %   FEV1% 0.94 %   FEV1/FVC Ratio 74     Breath   Shortness of Breath Yes      Exercise Target Goals:    Exercise Program Goal: Individual exercise prescription set with THRR, safety & activity barriers. Participant demonstrates ability to understand and report RPE using BORG scale, to self-measure pulse accurately, and to acknowledge the importance of the exercise prescription.  Exercise Prescription Goal: Starting with aerobic activity 30 plus minutes a day, 3 days per week  for initial exercise  prescription. Provide home exercise prescription and guidelines that participant acknowledges understanding prior to discharge.  Activity Barriers & Risk Stratification:     Activity Barriers & Cardiac Risk Stratification - 02/06/16 1651      Activity Barriers & Cardiac Risk Stratification   Activity Barriers Back Problems;Deconditioning  Left knee problems   Cardiac Risk Stratification High      6 Minute Walk:     6 Minute Walk    Row Name 02/07/16 1400 07/02/16 1231       6 Minute Walk   Phase Initial Discharge    Distance 735 feet 800 feet    Distance % Change  - 8.84 %    Walk Time 6 minutes 6 minutes    # of Rest Breaks 0 0    MPH 1.39 1.51    METS 2.06 2.16    RPE 13 10    Perceived Dyspnea  13 9    VO2 Peak 3.38 4.47    Symptoms No No    Resting HR 62 bpm 77 bpm    Resting BP 110/60 96/54    Max Ex. HR 103 bpm 96 bpm    Max Ex. BP 144/62 168/70    2 Minute Post BP 122/60 120/66       Initial Exercise Prescription:     Initial Exercise Prescription - 02/07/16 1400      Date of Initial Exercise RX and Referring Provider   Date 02/06/16   Referring Provider Dr. Haroldine Laws     Treadmill   MPH 0.6   Grade 0   Minutes 15   METs 1.4     NuStep   Level 2   Watts 11   Minutes 15   METs 1.8     Arm Ergometer   Level 1.5   Watts 15   Minutes 15   METs 2.1     Prescription Details   Frequency (times per week) 2   Duration Progress to 30 minutes of continuous aerobic without signs/symptoms of physical distress     Intensity   THRR REST +  20   THRR 40-80% of Max Heartrate [phone]   Ratings of Perceived Exertion 11-13   Perceived Dyspnea 0-4     Progression   Progression Continue to progress workloads to maintain intensity without signs/symptoms of physical distress.     Resistance Training   Training Prescription Yes   Weight 1   Reps 10-12      Perform Capillary Blood Glucose checks as needed.  Exercise Prescription Changes:       Exercise Prescription Changes    Row Name 02/13/16 1200 02/13/16 1700 02/22/16 0700 03/05/16 1200 03/13/16 0900     Exercise Review   Progression No No No No Yes     Response to Exercise   Blood Pressure (Admit) 98/50 98/50 98/54  96/52 100/52   Blood Pressure (Exercise) 122/60 122/60 136/72 124/60 138/64   Blood Pressure (Exit) 92/50 92/50 104/60 104/58 104/54   Heart Rate (Admit) 54 bpm 54 bpm 94 bpm 57 bpm 71 bpm   Heart Rate (Exercise) 60 bpm 60 bpm 83 bpm 74 bpm 74 bpm   Heart Rate (Exit) 58 bpm 58 bpm 72 bpm 66 bpm 80 bpm   Oxygen Saturation (Admit) 90 % 90 % 90 % 95 % 97 %   Oxygen Saturation (Exercise) 93 % 93 % 86 % 90 % 90 %   Oxygen Saturation (Exit) 90 % 90 %  91 % 90 % 89 %   Rating of Perceived Exertion (Exercise) 13 13 11 11 15    Perceived Dyspnea (Exercise) 11 11 11 11 15    Comments Patient discontinued treadmill at 10 minutes due to fatigue. Her prescription will be changed to Nustep to prevent this fatigue. Patient discontinued treadmill at 10 minutes due to fatigue. Her prescription will be changed to Nustep to prevent this fatigue. Patient discontinued treadmill at 10 minutes due to fatigue. Her prescription will be changed to Nustep to prevent this fatigue. Patient discontinued treadmill at 10 minutes due to fatigue. Her prescription will be changed to Nustep to prevent this fatigue.  -   Duration Progress to 30 minutes of continuous aerobic without signs/symptoms of physical distress Progress to 30 minutes of continuous aerobic without signs/symptoms of physical distress Progress to 30 minutes of continuous aerobic without signs/symptoms of physical distress Progress to 30 minutes of continuous aerobic without signs/symptoms of physical distress Progress to 30 minutes of continuous aerobic without signs/symptoms of physical distress   Intensity Rest + 20 Rest + 20 Rest + 20 Rest + 20 Rest + 20     Progression   Progression Continue to progress workloads to maintain  intensity without signs/symptoms of physical distress. Continue to progress workloads to maintain intensity without signs/symptoms of physical distress. Continue to progress workloads to maintain intensity without signs/symptoms of physical distress. Continue to progress workloads to maintain intensity without signs/symptoms of physical distress. Continue to progress workloads to maintain intensity without signs/symptoms of physical distress.     Resistance Training   Training Prescription No No No No Yes   Weight 1 1 1 2 2    Reps 10-12 8-10 8-10 8-10 10-12     Treadmill   MPH  - 0.06 0.06 0.06  -   Grade  - 0 0 0  -   Minutes  - 15 15 15   -   METs  - 1.49 1.49 1.49  -     NuStep   Level 2  - 2 2 3    Watts 11  - 13 10 13    Minutes 15  - 15 15 15    METs 1.8  - 3.67 3.66 3.65     Arm Ergometer   Level 1.5 1.5 1.5 2 2.2   Watts 12 12 9 22 12    Minutes 20 20 20 20 20    METs 1.8 1.8 2 2.6 1.9     Home Exercise Plan   Plans to continue exercise at [place]   Frequency Add 3 additional days to program exercise sessions. Add 3 additional days to program exercise sessions. Add 3 additional days to program exercise sessions. Add 3 additional days to program exercise sessions. Add 2 additional days to program exercise sessions.   [place] Name 03/26/16 1200 04/04/16 0700 04/30/16 1000 05/29/16 1400 06/25/16 1200     Exercise Review   Progression Yes Yes Yes Yes Yes     Response to Exercise   Blood Pressure (Admit) 90/48 104/60 96/48 106/50 96/54   Blood Pressure (Exercise) 122/72 122/64 120/60 130/66 168/70   Blood Pressure (Exit) 102/50 96/46 96/52  96/50 106/58   Heart Rate (Admit) 69 bpm 58 bpm 54 bpm 90 bpm 77 bpm   Heart Rate (Exercise) 87 bpm 104 bpm 70 bpm 70 bpm 96 bpm   Heart Rate (Exit) 74 bpm 58 bpm 63 bpm 87 bpm 84 bpm   Oxygen Saturation (Admit) 97 % 92 % 97 %  94 % 95 %   Oxygen Saturation (Exercise) 92 % 90 % 90 % 92 % 90 %   Oxygen Saturation (Exit) 91 % 90 %  91 % 90 % 90 %   Rating of Perceived Exertion (Exercise) 11 13 13 13 10    Perceived Dyspnea (Exercise) 11 13 11 11 9    Duration Progress to 30 minutes of continuous aerobic without signs/symptoms of physical distress Progress to 30 minutes of continuous aerobic without signs/symptoms of physical distress Progress to 30 minutes of continuous aerobic without signs/symptoms of physical distress Progress to 30 minutes of continuous aerobic without signs/symptoms of physical distress Progress to 30 minutes of continuous aerobic without signs/symptoms of physical distress   Intensity Rest + 20 Rest + 20 Rest + 20 Rest + 20 Rest + 20     Progression   Progression Continue to progress workloads to maintain intensity without signs/symptoms of physical distress. Continue to progress workloads to maintain intensity without signs/symptoms of physical distress. Continue to progress workloads to maintain intensity without signs/symptoms of physical distress. Continue to progress workloads to maintain intensity without signs/symptoms of physical distress. Continue to progress workloads to maintain intensity without signs/symptoms of physical distress.     Resistance Training   Training Prescription Yes Yes Yes Yes Yes   Weight 2 2 3 3 3    Reps 10-12 10-12 10-12 10-12 10-12     NuStep   Level 3 3 2   Patient was brought down a level due to arthritis  3 3   Watts 14 12 11 17 15    Minutes 15 19 19 20 20    METs 3.65 3.65 3.65 3.64 3.63     Arm Ergometer   Level 2.3 2.3 2.5 3 3.5   Watts 25 19 20 22 20    Minutes 20 20 20 20 20    METs 2.9 2.4 2.2 2.6 2.5     Home Exercise Plan   Plans to continue exercise at [place]   Frequency Add 2 additional days to program exercise sessions. Add 2 additional days to program exercise sessions. Add 2 additional days to program exercise sessions. Add 2 additional days to program exercise sessions. Add 2 additional days to program exercise sessions.       Exercise Comments:      Exercise Comments    Row Name 03/13/16 [id number] 03/26/16 1250 04/30/16 1014 05/29/16 1447 06/25/16 1239   Exercise Comments [place] is proggressing appropriately  Patient is progressing appropriately Patient is proggressing although arthritis has been a hindering factor  Patient is progressing appropriately and has been switched to arm ergometer first due to class change  Patient is proggressing well.      Discharge Exercise Prescription (Final Exercise Prescription Changes):     Exercise Prescription Changes - 06/25/16 1200      Exercise Review   Progression Yes     Response to Exercise   Blood Pressure (Admit) 96/54   Blood Pressure (Exercise) 168/70   Blood Pressure (Exit) 106/58   Heart Rate (Admit) 77 bpm   Heart Rate (Exercise) 96 bpm   Heart Rate (Exit) 84 bpm   Oxygen Saturation (Admit) 95 %   Oxygen Saturation (Exercise) 90 %   Oxygen Saturation (Exit) 90 %   Rating of Perceived Exertion (Exercise) 10   Perceived Dyspnea (Exercise) 9   Duration Progress to 30 minutes of continuous aerobic without signs/symptoms of physical distress   Intensity Rest + 20     Progression  Progression Continue to progress workloads to maintain intensity without signs/symptoms of physical distress.     Resistance Training   Training Prescription Yes   Weight 3   Reps 10-12     NuStep   Level 3   Watts 15   Minutes 20   METs 3.63     Arm Ergometer   Level 3.5   Watts 20   Minutes 20   METs 2.5     Home Exercise Plan   Plans to continue exercise at Home   Frequency Add 2 additional days to program exercise sessions.      Nutrition:  Target Goals: Understanding of nutrition guidelines, daily intake of sodium 1500mg , cholesterol 200mg , calories 30% from fat and 7% or less from saturated fats, daily to have 5 or more servings of fruits and vegetables.  Biometrics:     Pre Biometrics - 02/07/16 1404      Pre Biometrics   Height 5\' 3"  (1.6  m)   Weight 217 lb 9.5 oz (98.7 kg)   Waist Circumference 50 inches   Hip Circumference 47.5 inches   Waist to Hip Ratio 1.05 %   BMI (Calculated) 38.6   Triceps Skinfold 21 mm   % Body Fat 29 %   Grip Strength 40.3 kg   Flexibility 0 in   Single Leg Stand 0 seconds         Post Biometrics - 07/02/16 1232       Post  Biometrics   Height 5\' 3"  (1.6 m)   Weight 206 lb 5.6 oz (93.6 kg)   Waist Circumference 49 inches   Hip Circumference 46 inches   Waist to Hip Ratio 1.07 %   BMI (Calculated) 36.6   Triceps Skinfold 20 mm   % Body Fat 49 %   Grip Strength 44.7 kg   Flexibility 0 in   Single Leg Stand 0 seconds      Nutrition Therapy Plan and Nutrition Goals:   Nutrition Discharge: Rate Your Plate Scores:     Nutrition Assessments - 02/06/16 1715      Rate Your Plate Scores   Pre Score 64      Psychosocial: Target Goals: Acknowledge presence or absence of depression, maximize coping skills, provide positive support system. Participant is able to verbalize types and ability to use techniques and skills needed for reducing stress and depression.  Initial Review & Psychosocial Screening:     Initial Psych Review & Screening - 02/06/16 1719      Initial Review   Current issues with --  No issues     Family Dynamics   Good Support System? Yes     Barriers   Psychosocial barriers to participate in program There are no identifiable barriers or psychosocial needs.     Screening Interventions   Interventions Encouraged to exercise      Quality of Life Scores:     Quality of Life - 07/02/16 1233      Quality of Life Scores   Health/Function Pre 21 %   Health/Function Post 27.13 %   Health/Function % Change 29.19 %   Socioeconomic Pre 29.64 %   Socioeconomic Post 28.33 %   Socioeconomic % Change  -4.42 %   Psych/Spiritual Pre 29 %   Psych/Spiritual Post 25.71 %   Psych/Spiritual % Change -11.34 %   Family Pre 30 %   Family Post 30 %   Family %  Change 0 %   GLOBAL Pre 25.65 %  GLOBAL Post 27.47 %   GLOBAL % Change 7.1 %      PHQ-9: Recent Review Flowsheet Data    Depression screen [facility] 2/9 02/06/2016   Decreased Interest 0   Down, Depressed, Hopeless 0   PHQ - 2 Score 0   Altered sleeping 0   Tired, decreased energy 0   Change in appetite 0   Feeling bad or failure about yourself  0   Trouble concentrating 0   Moving slowly or fidgety/restless 0   Suicidal thoughts 0   PHQ-9 Score 0   Difficult doing work/chores Somewhat difficult      Psychosocial Evaluation and Intervention:     Psychosocial Evaluation - 07/03/16 1457      Discharge Psychosocial Assessment & Intervention   Comments Patient has no psychosocial issues identified at graduation.       Psychosocial Re-Evaluation:     Psychosocial Re-Evaluation    [place] Name 03/14/16 1245 04/04/16 0703           Psychosocial Re-Evaluation   Interventions  - Encouraged to attend Pulmonary Rehabilitation for the exercise      Comments Patient has no psychosocial issues. Her PHQ-9 score is 0 and her QOL score is 25.65. Patient does not need counseling.  Patient has no psychosocial issues. Her PHQ-9 score is 0 and her QOL score is 25.65. Patient does not need counseling.       Continued Psychosocial Services Needed No No         Education: Education Goals: Education classes will be provided on a weekly basis, covering required topics. Participant will state understanding/return demonstration of topics presented.  Learning Barriers/Preferences:     Learning Barriers/Preferences - 02/06/16 1652      Learning Barriers/Preferences   Learning Barriers None   Learning Preferences Written Material      Education Topics: How Lungs Work and Diseases: - Discuss the anatomy of the lungs and diseases that can affect the lungs, such as COPD. Flowsheet Row PULMONARY REHAB OTHER RESPIRATORY from 06/27/2016 in [place]  Date  03/21/16   Educator  DC  Instruction Review Code  2- meets goals/outcomes      Exercise: -Discuss the importance of exercise, FITT principles of exercise, normal and abnormal responses to exercise, and how to exercise safely. Flowsheet Row PULMONARY REHAB OTHER RESPIRATORY from 06/27/2016 in [place]  Date  03/28/16  Educator  DC  Instruction Review Code  2- meets goals/outcomes      Environmental Irritants: -Discuss types of environmental irritants and how to limit exposure to environmental irritants. Flowsheet Row PULMONARY REHAB OTHER RESPIRATORY from 06/27/2016 in [place]  Date  04/04/16  Educator  Russella Dar  Instruction Review Code  2- meets goals/outcomes      Meds/Inhalers and oxygen: - Discuss respiratory medications, definition of an inhaler and oxygen, and the proper way to use an inhaler and oxygen. Flowsheet Row PULMONARY REHAB OTHER RESPIRATORY from 06/27/2016 in [place]  Date  04/11/16  Educator  Russella Dar  Instruction Review Code  2- meets goals/outcomes      Energy Saving Techniques: - Discuss methods to conserve energy and decrease shortness of breath when performing activities of daily living.  Flowsheet Row PULMONARY REHAB OTHER RESPIRATORY from 06/27/2016 in [place]  Date  04/18/16  Educator  DC  Instruction Review Code  2- meets goals/outcomes      Bronchial Hygiene / Breathing Techniques: -  Discuss breathing mechanics, pursed-lip breathing technique,  proper posture, effective ways to clear airways, and other functional breathing techniques   Cleaning Equipment: - Provides group verbal and written instruction about the health risks of elevated stress, cause of high stress, and healthy ways to reduce stress. Flowsheet Row PULMONARY REHAB OTHER RESPIRATORY from 06/27/2016 in [place]  Date  05/02/16  Educator  [place]  Instruction Review  Code  2- meets goals/outcomes      Nutrition I: Fats: - Discuss the types of cholesterol, what cholesterol does to the body, and how cholesterol levels can be controlled. Flowsheet Row PULMONARY REHAB OTHER RESPIRATORY from 06/27/2016 in [place]  Date  05/09/16  Educator  Russella Dar  Instruction Review Code  2- meets goals/outcomes      Nutrition II: Labels: -Discuss the different components of food labels and how to read food labels. Flowsheet Row PULMONARY REHAB OTHER RESPIRATORY from 06/27/2016 in [place]  Date  02/15/16  Educator  Russella Dar  Instruction Review Code  2- meets goals/outcomes      Respiratory Infections: - Discuss the signs and symptoms of respiratory infections, ways to prevent respiratory infections, and the importance of seeking medical treatment when having a respiratory infection. Flowsheet Row PULMONARY REHAB OTHER RESPIRATORY from 06/27/2016 in [place]  Date  02/22/16  Educator  DC  Instruction Review Code  2- meets goals/outcomes      Stress I: Signs and Symptoms: - Discuss the causes of stress, how stress may lead to anxiety and depression, and ways to limit stress. Flowsheet Row PULMONARY REHAB OTHER RESPIRATORY from 06/27/2016 in [place]  Date  02/29/16  Educator  DC  Instruction Review Code  2- meets goals/outcomes      Stress II: Relaxation: -Discuss relaxation techniques to limit stress. Flowsheet Row PULMONARY REHAB OTHER RESPIRATORY from 06/27/2016 in [place]  Date  03/07/16  Educator  DC  Instruction Review Code  2- meets goals/outcomes      Oxygen for Home/Travel: - Discuss how to prepare for travel when on oxygen and proper ways to transport and store oxygen to ensure safety. Flowsheet Row PULMONARY REHAB OTHER RESPIRATORY from 06/27/2016 in [place]  Date  03/14/16  Educator  DJ   Instruction Review Code  2- meets goals/outcomes      Knowledge Questionnaire Score:     Knowledge Questionnaire Score - 02/06/16 1708      Knowledge Questionnaire Score   Pre Score 11/14      Core Components/Risk Factors/Patient Goals at Admission:     Personal Goals and Risk Factors at Admission - 02/06/16 1716      Core Components/Risk Factors/Patient Goals on Admission    Weight Management Weight Loss   Increase Strength and Stamina Yes   Intervention Provide advice, education, support and counseling about physical activity/exercise needs.;Develop an individualized exercise prescription for aerobic and resistive training based on initial evaluation findings, risk stratification, comorbidities and participant's personal goals.   Expected Outcomes Achievement of increased cardiorespiratory fitness and enhanced flexibility, muscular endurance and strength shown through measurements of functional capacity and personal statement of participant.   Improve shortness of breath with ADL's Yes   Intervention Provide education, individualized exercise plan and daily activity instruction to help decrease symptoms of SOB with activities of daily living.   Expected Outcomes Short Term: Achieves a reduction of symptoms when performing activities of daily living.  Develop more efficient breathing techniques such as purse lipped breathing and diaphragmatic breathing; and practicing self-pacing with activity Yes   Intervention Provide education, demonstration and support about specific breathing techniuqes utilized for more efficient breathing. Include techniques such as pursed lipped breathing, diaphragmatic breathing and self-pacing activity.   Expected Outcomes Short Term: Participant will be able to demonstrate and use breathing techniques as needed throughout daily activities.   Heart Failure Yes   Intervention Provide a combined exercise and nutrition program that is supplemented with  education, support and counseling about heart failure. Directed toward relieving symptoms such as shortness of breath, decreased exercise tolerance, and extremity edema.   Expected Outcomes Improve functional capacity of life;Short term: Attendance in program 2-3 days a week with increased exercise capacity. Reported lower sodium intake. Reported increased fruit and vegetable intake. Reports medication compliance.;Short term: Daily weights obtained and reported for increase. Utilizing diuretic protocols set by physician.;Long term: Adoption of self-care skills and reduction of barriers for early signs and symptoms recognition and intervention leading to self-care maintenance.   Personal Goal Other Yes   Personal Goal Breath better, understand her donction better, feel better overall and continue to feel better.    Intervention Attend 2xweek in PR and supplement with 2 more days at home of exercise.    Expected Outcomes To feel stronger and breath better      Core Components/Risk Factors/Patient Goals Review:      Goals and Risk Factor Review    Row Name 02/13/16 1759 03/14/16 1022 04/04/16 0703 05/02/16 1352 07/03/16 1453     Core Components/Risk Factors/Patient Goals Review   Personal Goals Review Weight Management/Obesity;Increase Strength and Stamina;Develop more efficient breathing techniques such as purse lipped breathing and diaphragmatic breathing and practicing self-pacing with activity. Improve shortness of breath with ADL's;Increase Strength and Stamina;Other  Get better overall.  Improve shortness of breath with ADL's;Increase Strength and Stamina;Other Weight Management/Obesity;Increase Strength and Stamina;Improve shortness of breath with ADL's;Develop more efficient breathing techniques such as purse lipped breathing and diaphragmatic breathing and practicing self-pacing with activity.;Other  Get better overall. Weight Management/Obesity;Increase Strength and Stamina;Improve shortness  of breath with ADL's;Develop more efficient breathing techniques such as purse lipped breathing and diaphragmatic breathing and practicing self-pacing with activity.   Review  - Patient's SOB is improving overall and she feels she is benefiting from the program.  Patient's SOB is improving overall and she feels she is benefiting from the program.  After 22 sessions, patient has maintained her weight. 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 She returned 9/12 stating her OA was better. Her strength and stamina have improved along with her SOB and breathing. She continues to do well in the program.  Upon graduation, patient gained 7.8 lbs. She says the program has really helped her with improved strength and stamina and improved SOB with ADL's. She feels stronger and better overall. She plans to join our [employer].    Expected Outcomes Get better overall, stay better, breath better Continue to improve SOB and increase strength and stamina.  Continue to improve SOB and increase strength and stamina.  Patient will continue to attend program and meet her above stated goals.  Patient will continue to exercise with continued improved strength and stamina and SOB.       Core Components/Risk Factors/Patient Goals at Discharge (Final Review):      Goals and Risk Factor Review - 07/03/16 1453      Core Components/Risk Factors/Patient  Goals Review   Personal Goals Review Weight Management/Obesity;Increase Strength and Stamina;Improve shortness of breath with ADL's;Develop more efficient breathing techniques such as purse lipped breathing and diaphragmatic breathing and practicing self-pacing with activity.   Review Upon graduation, patient gained 7.8 lbs. She says the program has really helped her with improved strength and stamina and improved SOB with ADL's. She feels stronger and better overall. She plans to join our [employer].    Expected Outcomes Patient  will continue to exercise with continued improved strength and stamina and SOB.       ITP Comments:   Comments: ITP REVIEW Pt is making expected progress toward pulmonary rehab goals after completing 36 sessions. Recommend continued exercise, life style modification, education, and utilization of breathing techniques to increase stamina and strength and decrease shortness of breath with exertion.

## 2016-07-04 ENCOUNTER — Encounter (HOSPITAL_COMMUNITY): Payer: Medicare Other

## 2016-07-05 ENCOUNTER — Telehealth: Payer: Self-pay | Admitting: Emergency Medicine

## 2016-07-05 NOTE — Progress Notes (Signed)
Discharge Summary  Patient Details  Name: [name] MRN: [id number] Date of Birth: 1935/02/05 Referring Provider:   Flowsheet Row PULMONARY REHAB OTHER RESP ORIENTATION from 02/06/2016 in [facility]  Referring Provider  Dr. Haroldine Laws       Number of Visits: 36  Reason for Discharge:  Patient reached a stable level of exercise. Patient independent in their exercise.  Smoking History:  History  Smoking Status  . Former Smoker  . Packs/day: 0.30  . Years: 0.50  . Types: Cigarettes  . Quit date: 08/19/1986  Smokeless Tobacco  . Never Used    Comment: pt only smoked for only a few months.    Diagnosis:  Diastolic CHF, chronic ([place])  ADL UCSD:     Pulmonary Assessment Scores    Row Name 02/06/16 1712 07/05/16 1424       ADL UCSD   ADL Phase Entry Exit    SOB Score total 50 43    Rest 0 1    Walk 6 7    Stairs 3 2    Bath 2 0    Dress 2 1    Shop 3 2      CAT Score   CAT Score 16 7      mMRC Score   mMRC Score 2 2       Initial Exercise Prescription:     Initial Exercise Prescription - 02/07/16 1400      Date of Initial Exercise RX and Referring Provider   Date 02/06/16   Referring Provider Dr. Haroldine Laws     Treadmill   MPH 0.6   Grade 0   Minutes 15   METs 1.4     NuStep   Level 2   Watts 11   Minutes 15   METs 1.8     Arm Ergometer   Level 1.5   Watts 15   Minutes 15   METs 2.1     Prescription Details   Frequency (times per week) 2   Duration Progress to 30 minutes of continuous aerobic without signs/symptoms of physical distress     Intensity   THRR REST +  20   THRR 40-80% of Max Heartrate 87-99-112   Ratings of Perceived Exertion 11-13   Perceived Dyspnea 0-4     Progression   Progression Continue to progress workloads to maintain intensity without signs/symptoms of physical distress.     Resistance Training   Training Prescription Yes   Weight 1   Reps 10-12      Discharge Exercise  Prescription (Final Exercise Prescription Changes):     Exercise Prescription Changes - 06/25/16 1200      Exercise Review   Progression Yes     Response to Exercise   Blood Pressure (Admit) 96/54   Blood Pressure (Exercise) 168/70   Blood Pressure (Exit) 106/58   Heart Rate (Admit) 77 bpm   Heart Rate (Exercise) 96 bpm   Heart Rate (Exit) 84 bpm   Oxygen Saturation (Admit) 95 %   Oxygen Saturation (Exercise) 90 %   Oxygen Saturation (Exit) 90 %   Rating of Perceived Exertion (Exercise) 10   Perceived Dyspnea (Exercise) 9   Duration Progress to 30 minutes of continuous aerobic without signs/symptoms of physical distress   Intensity Rest + 20     Progression   Progression Continue to progress workloads to maintain intensity without signs/symptoms of physical distress.     Resistance Training   Training Prescription Yes  Weight 3   Reps 10-12     NuStep   Level 3   Watts 15   Minutes 20   METs 3.63     Arm Ergometer   Level 3.5   Watts 20   Minutes 20   METs 2.5     Home Exercise Plan   Plans to continue exercise at Home   Frequency Add 2 additional days to program exercise sessions.      Functional Capacity:     6 Minute Walk    Row Name 02/07/16 1400 07/02/16 1231       6 Minute Walk   Phase Initial Discharge    Distance 735 feet 800 feet    Distance % Change  - 8.84 %    Walk Time 6 minutes 6 minutes    # of Rest Breaks 0 0    MPH 1.39 1.51    METS 2.06 2.16    RPE 13 10    Perceived Dyspnea  13 9    VO2 Peak 3.38 4.47    Symptoms No No    Resting HR 62 bpm 77 bpm    Resting BP 110/60 96/54    Max Ex. HR 103 bpm 96 bpm    Max Ex. BP 144/62 168/70    2 Minute Post BP 122/60 120/66       Psychological, QOL, Others - Outcomes: PHQ 2/9: Depression screen [facility] 2/9 07/05/2016 02/06/2016  Decreased Interest 0 0  Down, Depressed, Hopeless 0 0  PHQ - 2 Score 0 0  Altered sleeping 0 0  Tired, decreased energy 0 0  Change in appetite 0 0   Feeling bad or failure about yourself  0 0  Trouble concentrating 0 0  Moving slowly or fidgety/restless 0 0  Suicidal thoughts 0 0  PHQ-9 Score 0 0  Difficult doing work/chores - Somewhat difficult  Some recent data might be hidden    Quality of Life:     Quality of Life - 07/02/16 1233      Quality of Life Scores   Health/Function Pre 21 %   Health/Function Post 27.13 %   Health/Function % Change 29.19 %   Socioeconomic Pre 29.64 %   Socioeconomic Post 28.33 %   Socioeconomic % Change  -4.42 %   Psych/Spiritual Pre 29 %   Psych/Spiritual Post 25.71 %   Psych/Spiritual % Change -11.34 %   Family Pre 30 %   Family Post 30 %   Family % Change 0 %   GLOBAL Pre 25.65 %   GLOBAL Post 27.47 %   GLOBAL % Change 7.1 %      Personal Goals: Goals established at orientation with interventions provided to work toward goal.     Personal Goals and Risk Factors at Admission - 02/06/16 1716      Core Components/Risk Factors/Patient Goals on Admission    Weight Management Weight Loss   Increase Strength and Stamina Yes   Intervention Provide advice, education, support and counseling about physical activity/exercise needs.;Develop an individualized exercise prescription for aerobic and resistive training based on initial evaluation findings, risk stratification, comorbidities and participant's personal goals.   Expected Outcomes Achievement of increased cardiorespiratory fitness and enhanced flexibility, muscular endurance and strength shown through measurements of functional capacity and personal statement of participant.   Improve shortness of breath with ADL's Yes   Intervention Provide education, individualized exercise plan and daily activity instruction to help decrease symptoms of SOB with activities of  daily living.   Expected Outcomes Short Term: Achieves a reduction of symptoms when performing activities of daily living.   Develop more efficient breathing techniques such as  purse lipped breathing and diaphragmatic breathing; and practicing self-pacing with activity Yes   Intervention Provide education, demonstration and support about specific breathing techniuqes utilized for more efficient breathing. Include techniques such as pursed lipped breathing, diaphragmatic breathing and self-pacing activity.   Expected Outcomes Short Term: Participant will be able to demonstrate and use breathing techniques as needed throughout daily activities.   Heart Failure Yes   Intervention Provide a combined exercise and nutrition program that is supplemented with education, support and counseling about heart failure. Directed toward relieving symptoms such as shortness of breath, decreased exercise tolerance, and extremity edema.   Expected Outcomes Improve functional capacity of life;Short term: Attendance in program 2-3 days a week with increased exercise capacity. Reported lower sodium intake. Reported increased fruit and vegetable intake. Reports medication compliance.;Short term: Daily weights obtained and reported for increase. Utilizing diuretic protocols set by physician.;Long term: Adoption of self-care skills and reduction of barriers for early signs and symptoms recognition and intervention leading to self-care maintenance.   Personal Goal Other Yes   Personal Goal Breath better, understand her donction better, feel better overall and continue to feel better.    Intervention Attend 2xweek in PR and supplement with 2 more days at home of exercise.    Expected Outcomes To feel stronger and breath better       Personal Goals Discharge:     Goals and Risk Factor Review    Row Name 02/13/16 1759 03/14/16 1022 04/04/16 0703 05/02/16 1352 07/03/16 1453     Core Components/Risk Factors/Patient Goals Review   Personal Goals Review Weight Management/Obesity;Increase Strength and Stamina;Develop more efficient breathing techniques such as purse lipped breathing and diaphragmatic  breathing and practicing self-pacing with activity. Improve shortness of breath with ADL's;Increase Strength and Stamina;Other  Get better overall.  Improve shortness of breath with ADL's;Increase Strength and Stamina;Other Weight Management/Obesity;Increase Strength and Stamina;Improve shortness of breath with ADL's;Develop more efficient breathing techniques such as purse lipped breathing and diaphragmatic breathing and practicing self-pacing with activity.;Other  Get better overall. Weight Management/Obesity;Increase Strength and Stamina;Improve shortness of breath with ADL's;Develop more efficient breathing techniques such as purse lipped breathing and diaphragmatic breathing and practicing self-pacing with activity.   Review  - Patient's SOB is improving overall and she feels she is benefiting from the program.  Patient's SOB is improving overall and she feels she is benefiting from the program.  After 22 sessions, patient has maintained her weight. 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 She returned 9/12 stating her OA was better. Her strength and stamina have improved along with her SOB and breathing. She continues to do well in the program.  Upon graduation, patient gained 7.8 lbs. She says the program has really helped her with improved strength and stamina and improved SOB with ADL's. She feels stronger and better overall. She plans to join our [employer].    Expected Outcomes Get better overall, stay better, breath better Continue to improve SOB and increase strength and stamina.  Continue to improve SOB and increase strength and stamina.  Patient will continue to attend program and meet her above stated goals.  Patient will continue to exercise with continued improved strength and stamina and SOB.       Nutrition & Weight - Outcomes:     Pre  Biometrics - 02/07/16 1404      Pre Biometrics   Height 5\' 3"  (1.6 m)   Weight 217 lb 9.5 oz (98.7 kg)    Waist Circumference 50 inches   Hip Circumference 47.5 inches   Waist to Hip Ratio 1.05 %   BMI (Calculated) 38.6   Triceps Skinfold 21 mm   % Body Fat 29 %   Grip Strength 40.3 kg   Flexibility 0 in   Single Leg Stand 0 seconds         Post Biometrics - 07/02/16 1232       Post  Biometrics   Height 5\' 3"  (1.6 m)   Weight 206 lb 5.6 oz (93.6 kg)   Waist Circumference 49 inches   Hip Circumference 46 inches   Waist to Hip Ratio 1.07 %   BMI (Calculated) 36.6   Triceps Skinfold 20 mm   % Body Fat 49 %   Grip Strength 44.7 kg   Flexibility 0 in   Single Leg Stand 0 seconds      Nutrition:   Nutrition Discharge:     Nutrition Assessments - 07/05/16 1425      Rate Your Plate Scores   Pre Score 64   Pre Score % 64 %   Post Score 69   Post Score % 69 %   % Change 5 %      Education Questionnaire Score:     Knowledge Questionnaire Score - 07/05/16 1421      Knowledge Questionnaire Score   Pre Score 11/14   Post Score 10/14      Goals reviewed with patient; copy given to patient.

## 2016-07-05 NOTE — Telephone Encounter (Signed)
Attempted to call the pt but no answer and no VM.  Will call back .

## 2016-07-08 NOTE — Telephone Encounter (Signed)
Spoke with pt, wants to know if we can fill out assistance forms for Letairis for the upcoming year.  I advised pt that we would gladly fill out forms to get her medication affordable for her.  Pt states she will bring in forms once she gets them from manufacturer for [place] to fill out.    Will close encounter.

## 2016-07-18 ENCOUNTER — Telehealth: Payer: Self-pay | Admitting: Emergency Medicine

## 2016-07-18 NOTE — Telephone Encounter (Signed)
Spoke with Lenna Sciara  She states pt will need another ov, and ONO to be ordered, and then o2 order within 30 days of the ONO  This is all for ins purposes  Pt aware and appt with TP scheduled for 07/22/16

## 2016-07-18 NOTE — Telephone Encounter (Signed)
Spoke with the pt  She is needing noct o2 switched to [facility] due to [place] no longer accepting her ins  LMTCB to [facility] to be sure all they need is a new order, want to make sure no ONO needed before we send over

## 2016-07-20 ENCOUNTER — Telehealth: Payer: Self-pay | Admitting: Pulmonary Disease

## 2016-07-20 NOTE — Telephone Encounter (Signed)
81 year old female with secondary pulmonary hypertension on monotherapy with Letairis 5 mg by mouth daily since March 2017. Patient is continuing to use oxygen at night while sleeping. Reports she is unsure if she actually took her dose of Letairis this evening. We discussed taking a dose now which would mean a maximum dose of 10 mg today versus waiting until tomorrow evening to take her regularly scheduled dose. Patient feels most comfortable with waiting until tomorrow evening to take her dose which I feel is reasonable. She does live with her husband and daughter. I instructed her to seek immediate medical attention if she has any new difficulty breathing.

## 2016-07-22 ENCOUNTER — Ambulatory Visit (INDEPENDENT_AMBULATORY_CARE_PROVIDER_SITE_OTHER): Payer: Medicare Other | Admitting: Adult Health

## 2016-07-22 ENCOUNTER — Encounter: Payer: Self-pay | Admitting: Adult Health

## 2016-07-22 DIAGNOSIS — I2721 Secondary pulmonary arterial hypertension: Secondary | ICD-10-CM | POA: Diagnosis not present

## 2016-07-22 DIAGNOSIS — J9610 Chronic respiratory failure, unspecified whether with hypoxia or hypercapnia: Secondary | ICD-10-CM | POA: Insufficient documentation

## 2016-07-22 DIAGNOSIS — I2510 Atherosclerotic heart disease of native coronary artery without angina pectoris: Secondary | ICD-10-CM | POA: Diagnosis not present

## 2016-07-22 DIAGNOSIS — J9611 Chronic respiratory failure with hypoxia: Secondary | ICD-10-CM

## 2016-07-22 NOTE — Assessment & Plan Note (Signed)
Cont on current regimen  

## 2016-07-22 NOTE — Progress Notes (Signed)
Subjective:    Patient ID: [name], female    DOB: [birth year], 81 y.o.   MRN: [id number]  HPI  HPI 81 year old woman with a very remote and small tobacco history, history of hypertension, atrial fibrillation, coronary artery disease, psoriasis. She has secondary multifactorial pulmonary hypertension that has been presumed related to obesity with chronic hypoxemia, left-sided heart disease and presumed chronic thromboembolic disease. As noted she does also have a history of autoimmune process, psoriasis.  She is not on any targeted [place] therapy. Last seen by Dr Gwenette Greet here in February 2015. She had a CT scan of the chest 10/05/13 that showed pulmonary nodules, the 2 most dominant were in the left upper lobe, and a subsequent PET scan on 04/11/14 showed that these were not hypermetabolic  She is due for a final scan to confirm stability in February 2017. She underwent a polysomnogram in November 2013 with an AHI of 4.1 and RDI of 5.9.   07/22/2016 Follow up : Nocturnal hypoxia .  Pt presents today for follow up . She is followed for pulmonary HTN on Letaris.  She is on Eliquis for presumed related chronic thromboembolic dz.  She is on O2 at bedtime for nocturnal hypxoemia. At 2.5l/m . She is having to change DME companies d/t insurance requirements. She has been instructed she need ONO to requalify for O2 At bedtime  . Verified with DME/Insurance, ONO has to be done on room air despite known prior documentation of nocturnal hypoxia.  She says overall she is doing well with no flare of dyspnea or change in act level.   Past Medical History:  Diagnosis Date  . A-fib ([place])   . Arthritis    "knees" (11/17/2015)  . CAD (coronary artery disease)    BMS to LAD 07/2005  . Chronic atrial fibrillation ([place])    Since 2011  . Degenerative arthritis of right knee   . Essential hypertension   . GERD (gastroesophageal reflux disease)   . Hyperlipidemia   . Morbid obesity ([name])   . Myocardial  infarction 07/2005  . On home oxygen therapy    "2.5L at night" (11/17/2015)  . Psoriasis   . Pulmonary hypertension    Mixed, PASP 67 mmHg at [facility] 2013  . Right ventricular dysfunction   . Tricuspid regurgitation    Severe   Current Outpatient Prescriptions on File Prior to Visit  Medication Sig Dispense Refill  . ALPRAZolam (XANAX) 0.5 MG tablet Take 0.5 mg by mouth at bedtime.     [name] alum hydroxide-mag trisilicate (GAVISCON) [id number] MG CHEW chewable tablet Chew 1 tablet by mouth 3 (three) times daily as needed for indigestion or heartburn.    [name] ambrisentan (LETAIRIS) 5 MG tablet Take 1 tablet (5 mg total) by mouth daily. 20 tablet 0  . apixaban (ELIQUIS) 5 MG TABS tablet Take 1 tablet (5 mg total) by mouth 2 (two) times daily. 60 tablet 3  . atorvastatin (LIPITOR) 80 MG tablet TAKE 1/2 TABLET BY MOUTH DAILY 45 tablet 2  . AZOPT 1 % ophthalmic suspension Place 1 drop into both eyes 3 (three) times daily.     . brimonidine (ALPHAGAN) 0.2 % ophthalmic solution Place 1 drop into both eyes 2 (two) times daily.     . Coenzyme Q10 (COQ10) 200 MG CAPS Take 200 mg by mouth daily.    . colchicine 0.6 MG tablet Take 0.6 mg by mouth daily.    . fluticasone (FLONASE) 50 MCG/ACT nasal spray Place 2  sprays into both nostrils daily.     [name] HYDROcodone-acetaminophen (NORCO/VICODIN) 5-325 MG tablet Take 1 tablet by mouth 2 (two) times daily as needed for moderate pain.     [name] latanoprost (XALATAN) 0.005 % ophthalmic solution Place 1 drop into the right eye at bedtime.     . lidocaine (XYLOCAINE) 5 % ointment Apply 1 application topically daily as needed (knee pain).     [name] losartan (COZAAR) 100 MG tablet Take 100 mg by mouth every evening.    . nitroGLYCERIN (NITROSTAT) 0.4 MG SL tablet Place 0.4 mg under the tongue every 5 (five) minutes as needed for chest pain (x 3 tablets daily). Reported on 12/28/2015    . ondansetron (ZOFRAN ODT) 8 MG disintegrating tablet Take 1 tablet (8 mg total) by mouth every 8 (eight)  hours as needed for nausea or vomiting. 10 tablet 0  . potassium chloride SA (K-DUR,KLOR-CON) 20 MEQ tablet TAKE 1 TABLET BY MOUTH EVERY DAY 30 tablet 3  . Probiotic Product (PROBIOTIC PO) Take 1 tablet by mouth 2 (two) times daily.    [name] spironolactone (ALDACTONE) 25 MG tablet TAKE 1/2 TABLET BY MOUTH EVERY DAY 15 tablet 3  . timolol (TIMOPTIC) 0.5 % ophthalmic solution Place 1 drop into both eyes 2 (two) times daily.    [name] torsemide (DEMADEX) 20 MG tablet TAKE 2 TABLETS BY MOUTH EVERY DAY 180 tablet 3  . pantoprazole (PROTONIX) 20 MG tablet Take 20 mg by mouth daily. Reported on 02/10/2016     No current facility-administered medications on file prior to visit.         Review of Systems Constitutional:   No  weight loss, night sweats,  Fevers, chills, fatigue, or  lassitude.  HEENT:   No headaches,  Difficulty swallowing,  Tooth/dental problems, or  Sore throat,                No sneezing, itching, ear ache, nasal congestion, post nasal drip,   CV:  No chest pain,  Orthopnea, PND, swelling in lower extremities, anasarca, dizziness, palpitations, syncope.   GI  No heartburn, indigestion, abdominal pain, nausea, vomiting, diarrhea, change in bowel habits, loss of appetite, bloody stools.   Resp:    No excess mucus, no productive cough,  No non-productive cough,  No coughing up of blood.  No change in color of mucus.  No wheezing.  No chest wall deformity  Skin: no rash or lesions.  GU: no dysuria, change in color of urine, no urgency or frequency.  No flank pain, no hematuria   MS:  No joint pain or swelling.  No decreased range of motion.  No back pain.  Psych:  No change in mood or affect. No depression or anxiety.  No memory loss.         Objective:   Physical Exam Vitals:   07/22/16 0952  BP: 118/68  Pulse: (!) 56  Temp: 97.5 F (36.4 C)  TempSrc: Oral  SpO2: 97%  Weight: 206 lb (93.4 kg)  Height: 5\' 5"  (1.651 m)   GEN: A/Ox3; pleasant , NAD, well nourished      HEENT:  [place]/AT,  EACs-clear, TMs-wnl, NOSE-clear, THROAT-clear, no lesions, no postnasal drip or exudate noted.   NECK:  Supple w/ fair ROM; no JVD; normal carotid impulses w/o bruits; no thyromegaly or nodules palpated; no lymphadenopathy.    RESP  Clear  P & A; w/o, wheezes/ rales/ or rhonchi. no accessory muscle use, no dullness to percussion  CARD:  RRR, no m/r/g  , no peripheral edema, pulses intact, no cyanosis or clubbing.  GI:   Soft & nt; nml bowel sounds; no organomegaly or masses detected.   Musco: Warm bil, no deformities or joint swelling noted.   Neuro: alert, no focal deficits noted.    Skin: Warm, no lesions or rashes  Blanton Kardell NP-C  [facility]  07/22/2016       Assessment & Plan:

## 2016-07-22 NOTE — Patient Instructions (Signed)
We continue on oxygen 2.5l/m At bedtime   Continue on current regimen  Set up ONO .  Follow up Dr. Lamonte Sakai  As planned and As needed

## 2016-07-22 NOTE — Assessment & Plan Note (Signed)
DME/Insurance ONO requirement w/ face to face visit.  She is on 2.5l/m of O2 At bedtime  Due to proven nocturnal hypoxema . DME change d/t insurance requirement indicate she will need ONO to requalify O2 coverage At bedtime  .   Plan  Patient Instructions  We continue on oxygen 2.5l/m At bedtime   Continue on current regimen  Set up ONO .  Follow up Dr. Lamonte Sakai  As planned and As needed

## 2016-08-22 ENCOUNTER — Telehealth: Payer: Self-pay | Admitting: Emergency Medicine

## 2016-08-22 NOTE — Telephone Encounter (Signed)
Called and spoke to pt. Pt states she has not heard anything since December about her pt assistance for Letaris. Pt states she was informed by [employer] pt assistance group that she would be contacted by January 2018. Advised pt to contact the company to inquire. Pt verbalized understanding and states she will call back with an update on 1.5.18.  Will forward to [place] to follow.

## 2016-08-27 ENCOUNTER — Other Ambulatory Visit: Payer: Self-pay | Admitting: Adult Health

## 2016-08-27 DIAGNOSIS — I272 Pulmonary hypertension, unspecified: Secondary | ICD-10-CM

## 2016-08-27 NOTE — Telephone Encounter (Signed)
We received a fax from [employer]. Pt has been approved for patient assistance for Letairis. Patient assistance is good through 08/21/2017. Spoke with pt and she was already aware of this. Nothing further was needed.

## 2016-09-03 DIAGNOSIS — H401113 Primary open-angle glaucoma, right eye, severe stage: Secondary | ICD-10-CM | POA: Diagnosis not present

## 2016-09-16 ENCOUNTER — Telehealth: Payer: Self-pay | Admitting: Emergency Medicine

## 2016-09-16 DIAGNOSIS — I272 Pulmonary hypertension, unspecified: Secondary | ICD-10-CM

## 2016-09-16 NOTE — Telephone Encounter (Signed)
Contacted pt. And she is wanting to know the results of her ono and if the order was placed for her to have her oxygen through [facility] since [facility] is no longer covered under her insurance. I informed the pt. I would look into this and then we will contact her back. Did not see anything documented on pt's ono, reached out to [facility] and they are faxing it over. Will hold in triage to see if we receive the fax

## 2016-09-17 NOTE — Telephone Encounter (Signed)
This document has not been received yet. Both fax machines have been checked. Called [facility] and spoke with Marion. This document will be faxed to [phone] to my attention. Will keep a look out for this.

## 2016-09-17 NOTE — Telephone Encounter (Signed)
We still have not received this fax. Will continue to wait.

## 2016-09-18 NOTE — Telephone Encounter (Signed)
We have still not received this fax. I have left a message with Melissa with [facility] to have this refaxed to triage.

## 2016-09-19 ENCOUNTER — Other Ambulatory Visit (HOSPITAL_COMMUNITY): Payer: Self-pay | Admitting: Internal Medicine

## 2016-09-19 NOTE — Telephone Encounter (Signed)
Called and spoke to [facility] and was advised that the company they were contracting through for ONO's is no longer in operation, [employer]. [employer] is the company that did the pt's ONO. Melissa states that if we cannot locate the ONO that was faxed by [employer] then we will need to get another ONO. Spoke with Ria Comment and this has not been received.   LMTCB for pt to inform.

## 2016-09-19 NOTE — Telephone Encounter (Signed)
Spoke with pt. She is aware that we will have to repeat her ONO. Pt does not wish to do business with [facility] any further. She would like to give her business to [place]. ONO order has been placed for repeat ONO to be done with [facility]. Nothing further is needed with this message.

## 2016-09-19 NOTE — Telephone Encounter (Signed)
Patient returning call, CB is [phone] or [phone].

## 2016-09-19 NOTE — Telephone Encounter (Signed)
Called [facility] and spoke with Melissa. She is going to have this faxed to triage to my attention. Will await fax.

## 2016-09-19 NOTE — Telephone Encounter (Signed)
[name] calling back

## 2016-09-20 DIAGNOSIS — F419 Anxiety disorder, unspecified: Secondary | ICD-10-CM | POA: Diagnosis not present

## 2016-09-20 DIAGNOSIS — Z6838 Body mass index (BMI) 38.0-38.9, adult: Secondary | ICD-10-CM | POA: Diagnosis not present

## 2016-09-20 DIAGNOSIS — J329 Chronic sinusitis, unspecified: Secondary | ICD-10-CM | POA: Diagnosis not present

## 2016-09-20 DIAGNOSIS — J209 Acute bronchitis, unspecified: Secondary | ICD-10-CM | POA: Diagnosis not present

## 2016-09-24 ENCOUNTER — Telehealth: Payer: Self-pay | Admitting: Emergency Medicine

## 2016-09-24 ENCOUNTER — Encounter: Payer: Self-pay | Admitting: Emergency Medicine

## 2016-09-24 NOTE — Telephone Encounter (Signed)
Short term (for next week) she is OK to take OTC decongestants like Theraflu, etc. She needs to be careful to avoid taking anything with tylenol more than recommended on packaging. If she has sx after a week then have her call us so we can decide next steps.

## 2016-09-24 NOTE — Telephone Encounter (Addendum)
Spoke with pt. States that she is not feeling well. Reports increased coughing, sinus pressure/congestion. Denies chest tightness, wheezing, SOB or fever. Cough is producing gray mucus. Pt is not wanting an antibiotic, she just finished one. She would like to know what she can take as she is on Letairis.  RB - please advise. Thanks.

## 2016-09-24 NOTE — Telephone Encounter (Signed)
Pt aware of RB's recommendations & voiced her understanding. Nothing further needed.

## 2016-09-26 DIAGNOSIS — I10 Essential (primary) hypertension: Secondary | ICD-10-CM | POA: Diagnosis not present

## 2016-10-01 DIAGNOSIS — I272 Pulmonary hypertension, unspecified: Secondary | ICD-10-CM | POA: Diagnosis not present

## 2016-10-01 DIAGNOSIS — E6609 Other obesity due to excess calories: Secondary | ICD-10-CM | POA: Diagnosis not present

## 2016-10-01 DIAGNOSIS — Z6836 Body mass index (BMI) 36.0-36.9, adult: Secondary | ICD-10-CM | POA: Diagnosis not present

## 2016-10-01 DIAGNOSIS — J42 Unspecified chronic bronchitis: Secondary | ICD-10-CM | POA: Diagnosis not present

## 2016-10-01 DIAGNOSIS — I5032 Chronic diastolic (congestive) heart failure: Secondary | ICD-10-CM | POA: Diagnosis not present

## 2016-10-01 DIAGNOSIS — J449 Chronic obstructive pulmonary disease, unspecified: Secondary | ICD-10-CM | POA: Diagnosis not present

## 2016-10-01 DIAGNOSIS — J9610 Chronic respiratory failure, unspecified whether with hypoxia or hypercapnia: Secondary | ICD-10-CM | POA: Diagnosis not present

## 2016-10-01 DIAGNOSIS — J329 Chronic sinusitis, unspecified: Secondary | ICD-10-CM | POA: Diagnosis not present

## 2016-10-02 ENCOUNTER — Telehealth: Payer: Self-pay | Admitting: Pulmonary Disease

## 2016-10-02 NOTE — Telephone Encounter (Signed)
lmtcb for Mandy.  

## 2016-10-03 NOTE — Telephone Encounter (Signed)
Spoke with Leafy Ro at [place], states that pt needs to be seen in clinic in the 1-2 weeks for ONO to still be valid for dme switch.   Spoke with pt, scheduled rov with TP next week.  Nothing further needed.

## 2016-10-03 NOTE — Telephone Encounter (Signed)
[name] is returning phone call. [phone]

## 2016-10-08 ENCOUNTER — Ambulatory Visit (INDEPENDENT_AMBULATORY_CARE_PROVIDER_SITE_OTHER): Payer: Medicare Other | Admitting: Pulmonary Disease

## 2016-10-08 ENCOUNTER — Encounter: Payer: Self-pay | Admitting: Pulmonary Disease

## 2016-10-08 ENCOUNTER — Ambulatory Visit: Payer: Medicare Other | Admitting: Adult Health

## 2016-10-08 VITALS — BP 122/78 | HR 81 | Ht 63.0 in | Wt 207.4 lb

## 2016-10-08 DIAGNOSIS — J342 Deviated nasal septum: Secondary | ICD-10-CM | POA: Diagnosis not present

## 2016-10-08 DIAGNOSIS — H6123 Impacted cerumen, bilateral: Secondary | ICD-10-CM

## 2016-10-08 DIAGNOSIS — J0191 Acute recurrent sinusitis, unspecified: Secondary | ICD-10-CM

## 2016-10-08 DIAGNOSIS — I272 Pulmonary hypertension, unspecified: Secondary | ICD-10-CM

## 2016-10-08 NOTE — Progress Notes (Signed)
Current Outpatient Prescriptions on File Prior to Visit  Medication Sig  . ALPRAZolam (XANAX) 0.5 MG tablet Take 0.5 mg by mouth at bedtime.   [name] alum hydroxide-mag trisilicate (GAVISCON) [id number] MG CHEW chewable tablet Chew 1 tablet by mouth 3 (three) times daily as needed for indigestion or heartburn.  [name] ambrisentan (LETAIRIS) 5 MG tablet Take 1 tablet (5 mg total) by mouth daily.  [name] atorvastatin (LIPITOR) 80 MG tablet TAKE 1/2 TABLET BY MOUTH DAILY  . AZOPT 1 % ophthalmic suspension Place 1 drop into both eyes 3 (three) times daily.   . brimonidine (ALPHAGAN) 0.2 % ophthalmic solution Place 1 drop into both eyes 2 (two) times daily.   . Coenzyme Q10 (COQ10) 200 MG CAPS Take 200 mg by mouth daily.  . colchicine 0.6 MG tablet Take 0.6 mg by mouth daily.  [name] ELIQUIS 5 MG TABS tablet TAKE 1 TABLET BY MOUTH TWICE DAILY  . fluticasone (FLONASE) 50 MCG/ACT nasal spray Place 2 sprays into both nostrils daily.   [name] HYDROcodone-acetaminophen (NORCO/VICODIN) 5-325 MG tablet Take 1 tablet by mouth 2 (two) times daily as needed for moderate pain.   [name] latanoprost (XALATAN) 0.005 % ophthalmic solution Place 1 drop into the right eye at bedtime.   . lidocaine (XYLOCAINE) 5 % ointment Apply 1 application topically daily as needed (knee pain).   [name] losartan (COZAAR) 100 MG tablet Take 100 mg by mouth every evening.  . nitroGLYCERIN (NITROSTAT) 0.4 MG SL tablet Place 0.4 mg under the tongue every 5 (five) minutes as needed for chest pain (x 3 tablets daily). Reported on 12/28/2015  . omeprazole (PRILOSEC) 40 MG capsule Take 40 mg by mouth daily.  . ondansetron (ZOFRAN ODT) 8 MG disintegrating tablet Take 1 tablet (8 mg total) by mouth every 8 (eight) hours as needed for nausea or vomiting.  . pantoprazole (PROTONIX) 20 MG tablet Take 20 mg by mouth daily. Reported on 02/10/2016  . potassium chloride SA (K-DUR,KLOR-CON) 20 MEQ tablet TAKE 1 TABLET BY MOUTH EVERY DAY  . Probiotic Product (PROBIOTIC PO) Take 1 tablet by  mouth 2 (two) times daily.  [name] spironolactone (ALDACTONE) 25 MG tablet TAKE 1/2 TABLET BY MOUTH EVERY DAY  . timolol (TIMOPTIC) 0.5 % ophthalmic solution Place 1 drop into both eyes 2 (two) times daily.  [name] torsemide (DEMADEX) 20 MG tablet TAKE 2 TABLETS BY MOUTH EVERY DAY   No current facility-administered medications on file prior to visit.      Chief Complaint  Patient presents with  . Acute Visit    Pt states that she has been sick x 3 weeks with a sinus infection. Pt has no sense of smell, taste or appetite. Clear mucus production with cough. Pt reports failing multiple antibiotics d/t side effects.  Pt states that she coughs all night and is unable to sleep. Currently taking Albuterol 2mg  tabs and Doxy100mg . Pt states that she failed Zpak and Levaquin.      Past medical history, Past surgical history, Family history, Social history, Allergies all reviewed.  Vital Signs BP 122/78 (BP Location: Left Arm, Cuff Size: Normal)   Pulse 81   Ht 5\' 3"  (1.6 m)   Wt 207 lb 6.4 oz (94.1 kg)   SpO2 91%   BMI 36.74 kg/m   History of Present Illness [name] is a 82 y.o. female with pulmonary hypertension.  She developed a sinus infection 3 weeks ago.  She has nasal congestion, post nasal drip, and cough from sinus drainage.  Her cough is worse when she lays flat.  She was started on levaquin, but got tendon pain.  She was switched to zpak, but didn't help.  She was started on doxycycline 2 days ago >> no improvement.  She was started on albuterol pill >> no improvement, but made her shaky terribly.  She denies fever, chest pain, swelling, skin rash, headache, or ear pain.  Physical Exam  General - No distress ENT - No sinus tenderness, no oral exudate, no LAN, clear nasal drainage, septal deviation, cerumen build up b/l Cardiac - s1s2 regular, no murmur Chest - No wheeze/rales/dullness Back - No focal tenderness Abd - Soft, non-tender Ext - No edema Neuro - Normal  strength Skin - No rashes Psych - normal mood, and behavior   Assessment/Plan  Acute sinus infection. - likely viral - will have her stop doxycycline and albuterol pills - will have her continue astelin bid - she is to resume flonase daily - will have her use nasal irrigation daily - she can use OTC mucinex to help with cough >> she can call if feels a prescription cough medicine is needed  Nasal septal deviation. - might need ENT assessment if her sinus symptoms do not improve  Cerumen build up. - f/u with PCP  Pulmonary HTN. - will have her f/u with Dr. Lamonte Sakai in April 2018   Patient Instructions  Stop doxycycline Stop albuterol  Continue astelin Flonase one spray each nostril daily Mucinex 1200 mg twice per day until cough better Nasal irrigation (saline nasal spray) daily until sinus symptoms better  Call if not feeling better  Follow up with Dr. Lamonte Sakai in April 2018    Chesley Mires, MD [place] [facility] Pager:  [phone] 10/08/2016, 3:42 PM

## 2016-10-08 NOTE — Patient Instructions (Addendum)
Stop doxycycline Stop albuterol  Continue astelin Flonase one spray each nostril daily Mucinex 1200 mg twice per day until cough better Nasal irrigation (saline nasal spray) daily until sinus symptoms better  Call if not feeling better  Follow up with Dr. Lamonte Sakai in April 2018

## 2016-10-11 ENCOUNTER — Ambulatory Visit: Payer: Medicare Other | Admitting: Adult Health

## 2016-10-11 ENCOUNTER — Encounter: Payer: Self-pay | Admitting: Adult Health

## 2016-10-11 ENCOUNTER — Ambulatory Visit (INDEPENDENT_AMBULATORY_CARE_PROVIDER_SITE_OTHER): Payer: Medicare Other | Admitting: Adult Health

## 2016-10-11 DIAGNOSIS — J300 Vasomotor rhinitis: Secondary | ICD-10-CM

## 2016-10-11 DIAGNOSIS — J9611 Chronic respiratory failure with hypoxia: Secondary | ICD-10-CM

## 2016-10-11 DIAGNOSIS — I2721 Secondary pulmonary arterial hypertension: Secondary | ICD-10-CM

## 2016-10-11 MED ORDER — BENZONATATE 200 MG PO CAPS: 200.0000 mg | ORAL_CAPSULE | Freq: Three times a day (TID) | ORAL | 1 refills | Status: DC | PRN

## 2016-10-11 NOTE — Assessment & Plan Note (Signed)
Cont on current regimen  

## 2016-10-11 NOTE — Assessment & Plan Note (Signed)
Patient Instructions  We continue on oxygen 2.5l/m At bedtime   May Delsym 2 tsp Twice daily  As needed  Cough  Tessalon Three times a day  As needed  Cough  Continue on current regimen  Follow up Dr. Lamonte Sakai  As planned and As needed

## 2016-10-11 NOTE — Progress Notes (Signed)
@Patient  ID: [name], female    DOB: September 28, 1934, 82 y.o.   MRN: [id number]  Chief Complaint  Patient presents with  . Follow-up    O2 RF     Referring provider: Redmond School, MD  HPI: 82 year old woman with a very remote and small tobacco history, history of hypertension, atrial fibrillation, coronary artery disease, psoriasis. She has secondary multifactorial pulmonary hypertension that has been presumed related to obesity with chronic hypoxemia, left-sided heart disease and presumed chronic thromboembolic disease. As noted she does also have a history of autoimmune process, psoriasis.   TEST  2-D echo April 2017 showed an EF of 55-60%, left and right atrium with severe dilatation, pulmonary artery pressure 67 mmHg Right heart catheter 12/07/2015 showing moderate pulmonary artery hypertension, PA = 59/17 (36), normal wedge pressure, preserved cardiac output CT chest 09/2015 bilateral pulm nodules , stable since 2015 .  Polysomnogram in November 2013 with an AHI of 4.1 and RDI of 5.9.    10/11/2016 Follow up : Pulmonary HTN/O2 RF  Patient returns for a follow-up visit. Patient has known pulmonary hypertension is maintained on Letairis. She is on a liquid for presumed related chronic thromboembolic disease. She is on oxygen at bedtime at 2.5 L for nocturnal hypoxemia. Patient's home care company is being change due to insurance coverage and she underwent an overnight oximetry test for oxygen requirement. The ONO done on 09/24/16 with +desats on room air <88% 253 min , avg spO2  84%.   Patient was seen earlier this week for persistent nasal congestion, cough. She recently been treated for a sinus infection. She was recommended to use Flonase and Astelin. She was also recommended use Mucinex for cough. Patient says she does feel better but she continues to have a persistent cough and would like something for it.  She denies any fever, chest pain, orthopnea, PND, or increased leg  swelling  No Known Allergies  Immunization History  Administered Date(s) Administered  . Influenza Split 05/20/2011, 05/25/2012  . Influenza,inj,Quad PF,36+ Mos 07/19/2013, 07/06/2015, 05/17/2016  . Pneumococcal Conjugate-13 04/22/2016    Past Medical History:  Diagnosis Date  . A-fib ([place])   . Arthritis    "knees" (11/17/2015)  . CAD (coronary artery disease)    BMS to LAD 07/2005  . Chronic atrial fibrillation ([place])    Since 2011  . Degenerative arthritis of right knee   . Essential hypertension   . GERD (gastroesophageal reflux disease)   . Hyperlipidemia   . Morbid obesity ([place])   . Myocardial infarction 07/2005  . On home oxygen therapy    "2.5L at night" (11/17/2015)  . Psoriasis   . Pulmonary hypertension    Mixed, PASP 67 mmHg at [facility] 2013  . Right ventricular dysfunction   . Tricuspid regurgitation    Severe    Tobacco History: History  Smoking Status  . Former Smoker  . Packs/day: 0.30  . Years: 0.50  . Types: Cigarettes  . Quit date: 08/19/1986  Smokeless Tobacco  . Never Used    Comment: pt only smoked for only a few months.   Counseling given: Not Answered   Outpatient Encounter Prescriptions as of 10/11/2016  Medication Sig  . albuterol (PROVENTIL) 2 MG tablet Take 1 tablet by mouth 3 (three) times daily.  [name] ALPRAZolam (XANAX) 0.5 MG tablet Take 0.5 mg by mouth at bedtime.   [name] alum hydroxide-mag trisilicate (GAVISCON) [id number] MG CHEW chewable tablet Chew 1 tablet by mouth 3 (three) times daily  as needed for indigestion or heartburn.  [name] ambrisentan (LETAIRIS) 5 MG tablet Take 1 tablet (5 mg total) by mouth daily.  [name] atorvastatin (LIPITOR) 80 MG tablet TAKE 1/2 TABLET BY MOUTH DAILY  . AZOPT 1 % ophthalmic suspension Place 1 drop into both eyes 3 (three) times daily.   . brimonidine (ALPHAGAN) 0.2 % ophthalmic solution Place 1 drop into both eyes 2 (two) times daily.   . Coenzyme Q10 (COQ10) 200 MG CAPS Take 200 mg by mouth daily.  . colchicine 0.6 MG  tablet Take 0.6 mg by mouth daily.  [name] ELIQUIS 5 MG TABS tablet TAKE 1 TABLET BY MOUTH TWICE DAILY  . fluticasone (FLONASE) 50 MCG/ACT nasal spray Place 2 sprays into both nostrils daily.   [name] HYDROcodone-acetaminophen (NORCO/VICODIN) 5-325 MG tablet Take 1 tablet by mouth 2 (two) times daily as needed for moderate pain.   [name] latanoprost (XALATAN) 0.005 % ophthalmic solution Place 1 drop into the right eye at bedtime.   . lidocaine (XYLOCAINE) 5 % ointment Apply 1 application topically daily as needed (knee pain).   [name] losartan (COZAAR) 100 MG tablet Take 100 mg by mouth every evening.  . nitroGLYCERIN (NITROSTAT) 0.4 MG SL tablet Place 0.4 mg under the tongue every 5 (five) minutes as needed for chest pain (x 3 tablets daily). Reported on 12/28/2015  . omeprazole (PRILOSEC) 40 MG capsule Take 40 mg by mouth daily.  . ondansetron (ZOFRAN ODT) 8 MG disintegrating tablet Take 1 tablet (8 mg total) by mouth every 8 (eight) hours as needed for nausea or vomiting.  . pantoprazole (PROTONIX) 20 MG tablet Take 20 mg by mouth daily. Reported on 02/10/2016  . potassium chloride SA (K-DUR,KLOR-CON) 20 MEQ tablet TAKE 1 TABLET BY MOUTH EVERY DAY  . Probiotic Product (PROBIOTIC PO) Take 1 tablet by mouth 2 (two) times daily.  [name] spironolactone (ALDACTONE) 25 MG tablet TAKE 1/2 TABLET BY MOUTH EVERY DAY  . timolol (TIMOPTIC) 0.5 % ophthalmic solution Place 1 drop into both eyes 2 (two) times daily.  [name] torsemide (DEMADEX) 20 MG tablet TAKE 2 TABLETS BY MOUTH EVERY DAY  . benzonatate (TESSALON) 200 MG capsule Take 1 capsule (200 mg total) by mouth 3 (three) times daily as needed for cough.   No facility-administered encounter medications on file as of 10/11/2016.      Review of Systems  Constitutional:   No  weight loss, night sweats,  Fevers, chills, + fatigue, or  lassitude.  HEENT:   No headaches,  Difficulty swallowing,  Tooth/dental problems, or  Sore throat,                No sneezing, itching, ear ache,  nasal congestion, post nasal drip,   CV:  No chest pain,  Orthopnea, PND, swelling in lower extremities, anasarca, dizziness, palpitations, syncope.   GI  No heartburn, indigestion, abdominal pain, nausea, vomiting, diarrhea, change in bowel habits, loss of appetite, bloody stools.   Resp:  .  No chest wall deformity  Skin: no rash or lesions.  GU: no dysuria, change in color of urine, no urgency or frequency.  No flank pain, no hematuria   MS:  No joint pain or swelling.  No decreased range of motion.  No back pain.    Physical Exam  BP 112/68 (BP Location: Left Arm, Cuff Size: Large)   Pulse 64   Ht 5\' 3"  (1.6 m)   Wt 207 lb 3.2 oz (94 kg)   SpO2 96%   BMI  36.70 kg/m   GEN: A/Ox3; pleasant , NAD, obese with walker    HEENT:  [place]/AT,  EACs-clear, TMs-wnl, NOSE-clear, THROAT-clear, no lesions, no postnasal drip or exudate noted.   NECK:  Supple w/ fair ROM; no JVD; normal carotid impulses w/o bruits; no thyromegaly or nodules palpated; no lymphadenopathy.    RESP  Clear  P & A; w/o, wheezes/ rales/ or rhonchi. no accessory muscle use, no dullness to percussion  CARD:  RRR, no m/r/g, 1+ peripheral edema/teds , pulses intact, no cyanosis or clubbing.  GI:   Soft & nt; nml bowel sounds; no organomegaly or masses detected.   Musco: Warm bil, no deformities or joint swelling noted.   Neuro: alert, no focal deficits noted.    Skin: Warm, no lesions or rashes  Psych:  No change in mood or affect. No depression or anxiety.  No memory loss.  Lab Results:   ProBNP Imaging: No results found.   Assessment & Plan:   Chronic respiratory failure [facility]) Patient Instructions  We continue on oxygen 2.5l/m At bedtime   May Delsym 2 tsp Twice daily  As needed  Cough  Tessalon Three times a day  As needed  Cough  Continue on current regimen  Follow up Dr. Lamonte Sakai  As planned and As needed      [facility] (pulmonary artery hypertension) Cont on current regimen   Chronic  rhinitis Recent flare with residual cough  Add delsym and tessalon As needed   If not improving will need further evaluation  Please contact office for sooner follow up if symptoms do not improve or worsen or seek emergency care       Rexene Edison, NP 10/11/2016

## 2016-10-11 NOTE — Patient Instructions (Addendum)
We continue on oxygen 2.5l/m At bedtime   May Delsym 2 tsp Twice daily  As needed  Cough  Tessalon Three times a day  As needed  Cough  Continue on current regimen  Follow up Dr. Lamonte Sakai  As planned and As needed

## 2016-10-11 NOTE — Assessment & Plan Note (Signed)
Recent flare with residual cough  Add delsym and tessalon As needed   If not improving will need further evaluation  Please contact office for sooner follow up if symptoms do not improve or worsen or seek emergency care

## 2016-10-15 ENCOUNTER — Telehealth: Payer: Self-pay | Admitting: Emergency Medicine

## 2016-10-15 DIAGNOSIS — I272 Pulmonary hypertension, unspecified: Secondary | ICD-10-CM

## 2016-10-15 NOTE — Telephone Encounter (Signed)
Spoke with Leafy Ro at [facility]. They need an order entered for pt to continue oxygen at night time. This has been entered. Nothing further was needed.

## 2016-10-17 ENCOUNTER — Encounter (HOSPITAL_COMMUNITY): Payer: Self-pay

## 2016-10-17 ENCOUNTER — Emergency Department (HOSPITAL_COMMUNITY)
Admission: EM | Admit: 2016-10-17 | Discharge: 2016-10-17 | Disposition: A | Discharge status: Home / Self Care | Payer: Medicare Other | Attending: Emergency Medicine | Admitting: Emergency Medicine

## 2016-10-17 ENCOUNTER — Emergency Department (HOSPITAL_COMMUNITY): Payer: Medicare Other

## 2016-10-17 ENCOUNTER — Telehealth: Payer: Self-pay | Admitting: Adult Health

## 2016-10-17 DIAGNOSIS — I2510 Atherosclerotic heart disease of native coronary artery without angina pectoris: Secondary | ICD-10-CM | POA: Diagnosis not present

## 2016-10-17 DIAGNOSIS — I5032 Chronic diastolic (congestive) heart failure: Secondary | ICD-10-CM | POA: Insufficient documentation

## 2016-10-17 DIAGNOSIS — B9789 Other viral agents as the cause of diseases classified elsewhere: Secondary | ICD-10-CM

## 2016-10-17 DIAGNOSIS — Z955 Presence of coronary angioplasty implant and graft: Secondary | ICD-10-CM | POA: Insufficient documentation

## 2016-10-17 DIAGNOSIS — Z87891 Personal history of nicotine dependence: Secondary | ICD-10-CM | POA: Insufficient documentation

## 2016-10-17 DIAGNOSIS — Z7901 Long term (current) use of anticoagulants: Secondary | ICD-10-CM | POA: Insufficient documentation

## 2016-10-17 DIAGNOSIS — R05 Cough: Secondary | ICD-10-CM | POA: Diagnosis not present

## 2016-10-17 DIAGNOSIS — I252 Old myocardial infarction: Secondary | ICD-10-CM | POA: Diagnosis not present

## 2016-10-17 DIAGNOSIS — Z79899 Other long term (current) drug therapy: Secondary | ICD-10-CM | POA: Insufficient documentation

## 2016-10-17 DIAGNOSIS — J069 Acute upper respiratory infection, unspecified: Secondary | ICD-10-CM | POA: Diagnosis not present

## 2016-10-17 DIAGNOSIS — I110 Hypertensive heart disease with heart failure: Secondary | ICD-10-CM | POA: Insufficient documentation

## 2016-10-17 LAB — COMPREHENSIVE METABOLIC PANEL
ALBUMIN: 3.6 g/dL (ref 3.5–5.0)
ALK PHOS: 55 U/L (ref 38–126)
ALT: 10 U/L — AB (ref 14–54)
AST: 15 U/L (ref 15–41)
Anion gap: 7 (ref 5–15)
BILIRUBIN TOTAL: 1.3 mg/dL — AB (ref 0.3–1.2)
BUN: 55 mg/dL — AB (ref 6–20)
CALCIUM: 9.7 mg/dL (ref 8.9–10.3)
CO2: 30 mmol/L (ref 22–32)
CREATININE: 1.35 mg/dL — AB (ref 0.44–1.00)
Chloride: 100 mmol/L — ABNORMAL LOW (ref 101–111)
GFR calc Af Amer: 42 mL/min — ABNORMAL LOW (ref >60)
GFR calc non Af Amer: 36 mL/min — ABNORMAL LOW (ref >60)
GLUCOSE: 82 mg/dL (ref 65–99)
Potassium: 4.4 mmol/L (ref 3.5–5.1)
SODIUM: 137 mmol/L (ref 135–145)
TOTAL PROTEIN: 6.8 g/dL (ref 6.5–8.1)

## 2016-10-17 LAB — CBC WITH DIFFERENTIAL/PLATELET
BASOS PCT: 0 %
Basophils Absolute: 0 10*3/uL (ref 0.0–0.1)
EOS PCT: 0 %
Eosinophils Absolute: 0 10*3/uL (ref 0.0–0.7)
HEMATOCRIT: 34.7 % — AB (ref 36.0–46.0)
Hemoglobin: 10.6 g/dL — ABNORMAL LOW (ref 12.0–15.0)
Lymphocytes Relative: 22 %
Lymphs Abs: 1.5 10*3/uL (ref 0.7–4.0)
MCH: 29.4 pg (ref 26.0–34.0)
MCHC: 30.5 g/dL (ref 30.0–36.0)
MCV: 96.1 fL (ref 78.0–100.0)
MONO ABS: 0.8 10*3/uL (ref 0.1–1.0)
MONOS PCT: 11 %
NEUTROS ABS: 4.5 10*3/uL (ref 1.7–7.7)
Neutrophils Relative %: 67 %
PLATELETS: 128 10*3/uL — AB (ref 150–400)
RBC: 3.61 MIL/uL — ABNORMAL LOW (ref 3.87–5.11)
RDW: 13 % (ref 11.5–15.5)
WBC: 6.8 10*3/uL (ref 4.0–10.5)

## 2016-10-17 MED ORDER — GUAIFENESIN-DM 100-10 MG/5ML PO SYRP: 5.0000 mL | ORAL_SOLUTION | ORAL | 0 refills | Status: DC | PRN

## 2016-10-17 NOTE — ED Provider Notes (Signed)
[facility] DEPT Provider Note   CSN: [id number] Arrival date & time: 10/17/16  1430     History   Chief Complaint Chief Complaint  Patient presents with  . Recurrent Sinusitis    HPI [name] is a 82 y.o. female with history of chronic atrial fibrillation, CHF who presents with a 3-4 week history of cough. Patient has been evaluated by primary care provider, as well as pulmonologist has tried multiple medications. Patient was initially started on azithromycin without relief. Patient began taking Levaquin but had a tendinitis reaction, and it was stopped. Patient also began taking doxycycline, but was stopped a few days ago when she saw pulmonologist. Patient advised to take over-the-counter medications, including Mucinex and Delsym, as well as Tessalon. Patient continuing with a productive cough, worse at night. Patient denies any chest pain, shortness of breath, fevers, abdominal pain, nausea vomiting, urinary symptoms, sore throat, nasal congestion, new leg pain or swelling.  HPI  Past Medical History:  Diagnosis Date  . A-fib ([place])   . Arthritis    "knees" (11/17/2015)  . CAD (coronary artery disease)    BMS to LAD 07/2005  . Chronic atrial fibrillation ([place])    Since 2011  . Degenerative arthritis of right knee   . Essential hypertension   . GERD (gastroesophageal reflux disease)   . Hyperlipidemia   . Morbid obesity ([place])   . Myocardial infarction 07/2005  . On home oxygen therapy    "2.5L at night" (11/17/2015)  . Psoriasis   . Pulmonary hypertension    Mixed, PASP 67 mmHg at [facility] 2013  . Right ventricular dysfunction   . Tricuspid regurgitation    Severe    Patient Active Problem List   Diagnosis Date Noted  . Chronic respiratory failure ([place]) 07/22/2016  . Chronic rhinitis 02/08/2016  . Chronic diastolic heart failure ([place]) 12/28/2015  . PAH (pulmonary artery hypertension)   . CHF (congestive heart failure) ([place]) 11/17/2015  . Acute congestive  heart failure ([place]) 11/17/2015  . Pulmonary nodules 10/07/2013  . Encounter for therapeutic drug monitoring 09/22/2013  . DOE (dyspnea on exertion) 05/26/2012  . Psoriasis   . Carotid bruit   . Warfarin anticoagulation   . CAD (coronary artery disease)   . Pulmonary hypertension 04/19/2011  . Atrial fibrillation ([place]) 11/16/2010  . GERD 04/17/2010  . COLONIC POLYPS, HX OF 04/17/2010  . Hyperlipidemia 04/17/2009  . Overweight(278.02) 04/17/2009  . Essential hypertension 04/17/2009    Past Surgical History:  Procedure Laterality Date  . ACHILLES TENDON SURGERY Right 1970s  . CARDIAC CATHETERIZATION N/A 12/07/2015   Procedure: Right Heart Cath;  Surgeon: Jolaine Artist, MD;  Location: [place] CV LAB;  Service: Cardiovascular;  Laterality: N/A;  . CARPAL TUNNEL RELEASE  11/29/2011   Procedure: CARPAL TUNNEL RELEASE;  Surgeon: Cammie Sickle., MD;  Location: [place];  Service: Orthopedics;  Laterality: Right;  . COLONOSCOPY    . CORONARY ANGIOPLASTY WITH STENT PLACEMENT  07/2005   Dr. Olevia Perches  . EYE SURGERY    . LAPAROSCOPIC CHOLECYSTECTOMY    . RETINAL DETACHMENT SURGERY Right     OB History    No data available       Home Medications    Prior to Admission medications   Medication Sig Start Date End Date Taking? Authorizing Provider  albuterol (PROVENTIL) 2 MG tablet Take 1 tablet by mouth 3 (three) times daily. 10/01/16   Historical Provider, MD  ALPRAZolam Duanne Moron) 0.5 MG tablet  Take 0.5 mg by mouth at bedtime.     Historical Provider, MD  alum hydroxide-mag trisilicate (GAVISCON) [id number] MG CHEW chewable tablet Chew 1 tablet by mouth 3 (three) times daily as needed for indigestion or heartburn.    Historical Provider, MD  ambrisentan (LETAIRIS) 5 MG tablet Take 1 tablet (5 mg total) by mouth daily. 11/30/15   Collene Gobble, MD  atorvastatin (LIPITOR) 80 MG tablet TAKE 1/2 TABLET BY MOUTH DAILY 08/07/15   Satira Sark, MD  AZOPT 1 % ophthalmic  suspension Place 1 drop into both eyes 3 (three) times daily.  05/02/12   Historical Provider, MD  benzonatate (TESSALON) 200 MG capsule Take 1 capsule (200 mg total) by mouth 3 (three) times daily as needed for cough. 10/11/16 10/11/17  Tammy S Parrett, NP  brimonidine (ALPHAGAN) 0.2 % ophthalmic solution Place 1 drop into both eyes 2 (two) times daily.  04/21/15   Historical Provider, MD  Coenzyme Q10 (COQ10) 200 MG CAPS Take 200 mg by mouth daily.    Historical Provider, MD  colchicine 0.6 MG tablet Take 0.6 mg by mouth daily.    Historical Provider, MD  ELIQUIS 5 MG TABS tablet TAKE 1 TABLET BY MOUTH TWICE DAILY 09/20/16   Jolaine Artist, MD  fluticasone (FLONASE) 50 MCG/ACT nasal spray Place 2 sprays into both nostrils daily.  05/29/15   Historical Provider, MD  guaiFENesin-dextromethorphan (ROBITUSSIN DM) 100-10 MG/5ML syrup Take 5 mLs by mouth every 4 (four) hours as needed for cough. 10/17/16   Frederica Kuster, PA-C  HYDROcodone-acetaminophen (NORCO/VICODIN) 5-325 MG tablet Take 1 tablet by mouth 2 (two) times daily as needed for moderate pain.  03/26/15   Historical Provider, MD  latanoprost (XALATAN) 0.005 % ophthalmic solution Place 1 drop into the right eye at bedtime.  03/30/12   Historical Provider, MD  lidocaine (XYLOCAINE) 5 % ointment Apply 1 application topically daily as needed (knee pain).  04/10/15   Historical Provider, MD  losartan (COZAAR) 100 MG tablet Take 100 mg by mouth every evening.    Historical Provider, MD  nitroGLYCERIN (NITROSTAT) 0.4 MG SL tablet Place 0.4 mg under the tongue every 5 (five) minutes as needed for chest pain (x 3 tablets daily). Reported on 12/28/2015    Historical Provider, MD  omeprazole (PRILOSEC) 40 MG capsule Take 40 mg by mouth daily.    Historical Provider, MD  ondansetron (ZOFRAN ODT) 8 MG disintegrating tablet Take 1 tablet (8 mg total) by mouth every 8 (eight) hours as needed for nausea or vomiting. 02/10/16   Lajean Saver, MD  pantoprazole (PROTONIX)  20 MG tablet Take 20 mg by mouth daily. Reported on 02/10/2016    Historical Provider, MD  potassium chloride SA (K-DUR,KLOR-CON) 20 MEQ tablet TAKE 1 TABLET BY MOUTH EVERY DAY 07/01/16   Jolaine Artist, MD  Probiotic Product (PROBIOTIC PO) Take 1 tablet by mouth 2 (two) times daily.    Historical Provider, MD  spironolactone (ALDACTONE) 25 MG tablet TAKE 1/2 TABLET BY MOUTH EVERY DAY 05/06/16   Jolaine Artist, MD  timolol (TIMOPTIC) 0.5 % ophthalmic solution Place 1 drop into both eyes 2 (two) times daily. 11/20/15   Historical Provider, MD  torsemide (DEMADEX) 20 MG tablet TAKE 2 TABLETS BY MOUTH EVERY DAY 05/20/16   Jolaine Artist, MD    Family History Family History  Problem Relation Age of Onset  . Heart attack Mother   . Coronary artery disease Father   . Stroke Father   .  Heart attack Brother   . Heart failure Brother   . COPD Brother   . Emphysema Sister   . Emphysema Brother     Social History Social History  Substance Use Topics  . Smoking status: Former Smoker    Packs/day: 0.30    Years: 0.50    Types: Cigarettes    Quit date: 08/19/1986  . Smokeless tobacco: Never Used     Comment: pt only smoked for only a few months.  . Alcohol use No     Allergies   Patient has no known allergies.   Review of Systems Review of Systems  Constitutional: Negative for chills and fever.  HENT: Negative for facial swelling and sore throat.   Respiratory: Positive for cough. Negative for shortness of breath.   Cardiovascular: Negative for chest pain.  Gastrointestinal: Negative for abdominal pain, nausea and vomiting.  Genitourinary: Negative for dysuria.  Musculoskeletal: Negative for back pain.  Skin: Negative for rash and wound.  Neurological: Negative for headaches.  Psychiatric/Behavioral: The patient is not nervous/anxious.      Physical Exam Updated Vital Signs BP 106/63 (BP Location: Left Arm)   Pulse 69   Temp 98.5 F (36.9 C) (Oral)   Resp 16   SpO2  96%   Physical Exam  Constitutional: She appears well-developed and well-nourished. No distress.  HENT:  Head: Normocephalic and atraumatic.  Mouth/Throat: Oropharynx is clear and moist. No oropharyngeal exudate.  Eyes: Conjunctivae are normal. Pupils are equal, round, and reactive to light. Right eye exhibits no discharge. Left eye exhibits no discharge. No scleral icterus.  Neck: Normal range of motion. Neck supple. No thyromegaly present.  Cardiovascular: Normal rate, regular rhythm, normal heart sounds and intact distal pulses.  Exam reveals no gallop and no friction rub.   No murmur heard. Pulmonary/Chest: Effort normal and breath sounds normal. No stridor. No respiratory distress. She has no wheezes. She has no rales.  Abdominal: Soft. Bowel sounds are normal. She exhibits no distension. There is no tenderness. There is no rebound and no guarding.  Musculoskeletal: She exhibits no edema.  Lymphadenopathy:    She has no cervical adenopathy.  Neurological: She is alert. Coordination normal.  Skin: Skin is warm and dry. No rash noted. She is not diaphoretic. No pallor.  Psychiatric: She has a normal mood and affect.  Nursing note and vitals reviewed.    ED Treatments / Results  Labs (all labs ordered are listed, but only abnormal results are displayed) Labs Reviewed  COMPREHENSIVE METABOLIC PANEL - Abnormal; Notable for the following:       Result Value   Chloride 100 (*)    BUN 55 (*)    Creatinine, Ser 1.35 (*)    ALT 10 (*)    Total Bilirubin 1.3 (*)    GFR calc non Af Amer 36 (*)    GFR calc Af Amer 42 (*)    All other components within normal limits  CBC WITH DIFFERENTIAL/PLATELET - Abnormal; Notable for the following:    RBC 3.61 (*)    Hemoglobin 10.6 (*)    HCT 34.7 (*)    Platelets 128 (*)    All other components within normal limits    EKG  EKG Interpretation None       Radiology Dg Chest 2 View  Result Date: 10/17/2016 CLINICAL DATA:  Cough. EXAM:  CHEST  2 VIEW COMPARISON:  02/10/2016 . FINDINGS: Mediastinum and hilar structures normal. Cardiomegaly with pulmonary vascular prominence. No overt pulmonary  edema . Mild basilar atelectasis. No pleural effusion or pneumothorax IMPRESSION: 1. Cardiomegaly with bilateral pulmonary vascular prominence. No overt pulmonary edema. 2.  Mild basilar atelectasis. Electronically Signed   By: Marcello Moores  Register   On: 10/17/2016 16:04    Procedures Procedures (including critical care time)  Medications Ordered in ED Medications - No data to display   Initial Impression / Assessment and Plan / ED Course  I have reviewed the triage vital signs and the nursing notes.  Pertinent labs & imaging results that were available during my care of the patient were reviewed by me and considered in my medical decision making (see chart for details).     Patient is well appearing with persistent cough. CBC shows stable chronic anemia, hemoglobin 10.6. CMP shows chloride 100, BUN 55, creatinine 1.35, ALT 10, total bilirubin 1.3. CXR shows cardiomegaly with bilateral pulmonary vascular prominence, no overt coronary edema; mild basilar atelectasis. After discussion with Dr. Alvino Chapel, will recommend discontinuation of Delsym, Mucinex, Tessalon and replace with Robitussin-DM. Follow-up to pulmonary. Short-term precautions given. Patient understands and agrees with plan. Patient vitals stable throughout the course and discharged in satisfactory condition. Of note, patient states her blood pressure stable from the past.  Final Clinical Impressions(s) / ED Diagnoses   Final diagnoses:  Viral URI with cough    New Prescriptions New Prescriptions   GUAIFENESIN-DEXTROMETHORPHAN (ROBITUSSIN DM) 100-10 MG/5ML SYRUP    Take 5 mLs by mouth every 4 (four) hours as needed for cough.     Frederica Kuster, PA-C 10/17/16 1957    Davonna Belling, MD 10/18/16 [phone]

## 2016-10-17 NOTE — ED Triage Notes (Signed)
Pt has been taking abx for sinus infection and had to quit taking it due to side effects (levaquin). Pt reports continuous cough, worse at night. Requesting blood work and CXR.

## 2016-10-17 NOTE — Telephone Encounter (Signed)
Sorry to hear this , I do see she is in ER now  Please check with her and get her a follow up with Dr. Halford Chessman   Please contact office for sooner follow up if symptoms do not improve or worsen or seek emergency care

## 2016-10-17 NOTE — Telephone Encounter (Signed)
ATC, NA and no option to leave msg 

## 2016-10-17 NOTE — Discharge Instructions (Signed)
Medications: Robitussin DM  Treatment: Take Robitussin-DM every 4 hours as needed for your cough. Do not take this medication with Mucinex, Delsym, or Tessalon.  Follow-up: Please follow up with Dr. Halford Chessman for follow-up of today's visit and further evaluation of a persistent cough. Please return to the emergency Department if you develop any new or worsening symptoms including chest pain, shortness of breath, fevers over 100.4.

## 2016-10-17 NOTE — ED Notes (Signed)
E-signature not working. Pt states she understands d/c instructions.  

## 2016-10-17 NOTE — Telephone Encounter (Signed)
Spoke with pt. States that she is not feeling any better since seeing TP on 10/11/16. Reports cough. Cough is producing clear mucus. Denies chest tightness, wheezing, SOB or fever. At her last OV, TP gave Tessalon and instructed the pt to use Delsym for coughing. Pt states that these medications are not helping. Would like TP's recommendations.  TP - please advise. Thanks.

## 2016-10-18 NOTE — Telephone Encounter (Signed)
Attempted to contact pt. No answer, no option to leave a message. Will try back.  

## 2016-10-18 NOTE — Telephone Encounter (Signed)
Patient walked in to clinic, wanting to be seen, symptoms not improving.[name]

## 2016-10-18 NOTE — Telephone Encounter (Signed)
Spoke with pt, who reports of prod cough with white mucus that worsens at night. Denies any other symptoms. Pt seen at ED yesterday. Dx with URI and prescribed robitussin Per RB- continue robitussin given at ED yesterday, continue flonase and start chlorpheniramine 4mg  tabs. Pt also had concerned about O2 order, has she has not received oxygen yet. I have looked through RB's folder and there is a form from [employer] to be sign. I have made RB aware that form needs to be sign. Pt aware and voiced her understanding. Nothing further needed.

## 2016-10-21 ENCOUNTER — Other Ambulatory Visit (HOSPITAL_COMMUNITY): Payer: Self-pay | Admitting: Internal Medicine

## 2016-10-22 ENCOUNTER — Telehealth: Payer: Self-pay | Admitting: Emergency Medicine

## 2016-10-22 MED ORDER — AMBRISENTAN 5 MG PO TABS: 5.0000 mg | ORAL_TABLET | Freq: Every day | ORAL | 5 refills | Status: DC

## 2016-10-22 NOTE — Telephone Encounter (Signed)
Rx refill was sent to pharm

## 2016-10-23 ENCOUNTER — Other Ambulatory Visit: Payer: Self-pay | Admitting: *Deleted

## 2016-10-23 MED ORDER — AMBRISENTAN 5 MG PO TABS: 5.0000 mg | ORAL_TABLET | Freq: Every day | ORAL | 5 refills | Status: DC

## 2016-11-28 ENCOUNTER — Telehealth: Payer: Self-pay | Admitting: Emergency Medicine

## 2016-11-28 NOTE — Telephone Encounter (Signed)
atc pt X3, line rang to fast busy signal wcb 

## 2016-11-29 ENCOUNTER — Telehealth: Payer: Self-pay | Admitting: Emergency Medicine

## 2016-11-29 NOTE — Telephone Encounter (Signed)
Please ask her to avoid tylenol. Ibuprofen would be better, use as directed on the package

## 2016-11-29 NOTE — Telephone Encounter (Signed)
Attempted to call the pt but no answer and no VM.  Will try back later.

## 2016-11-29 NOTE — Telephone Encounter (Signed)
Called and spoke with pt and she stated that she normally takes hydrocodone for the pt and she is out at this time.  She has an appt next week to get a refill, but until that time she wanted to see what RB felt that would be ok for her to use OTC for pain---she stated that she take Letiaris and didn't know if Aleve, tylenol or advil would be ok to use.  RB please advise. Thanks  No Known Allergies

## 2016-11-29 NOTE — Telephone Encounter (Signed)
Called and spoke with pt and she is aware of RB recs.  Nothing further is needed.  

## 2016-12-02 NOTE — Telephone Encounter (Signed)
Called and spoke with pt and she stated that this was handled on friday

## 2016-12-04 DIAGNOSIS — F419 Anxiety disorder, unspecified: Secondary | ICD-10-CM | POA: Diagnosis not present

## 2016-12-04 DIAGNOSIS — G894 Chronic pain syndrome: Secondary | ICD-10-CM | POA: Diagnosis not present

## 2016-12-04 DIAGNOSIS — Z6837 Body mass index (BMI) 37.0-37.9, adult: Secondary | ICD-10-CM | POA: Diagnosis not present

## 2016-12-09 ENCOUNTER — Ambulatory Visit (INDEPENDENT_AMBULATORY_CARE_PROVIDER_SITE_OTHER): Payer: Medicare Other | Admitting: Emergency Medicine

## 2016-12-09 ENCOUNTER — Encounter: Payer: Self-pay | Admitting: Emergency Medicine

## 2016-12-09 ENCOUNTER — Other Ambulatory Visit (HOSPITAL_COMMUNITY): Payer: Self-pay | Admitting: Internal Medicine

## 2016-12-09 DIAGNOSIS — J310 Chronic rhinitis: Secondary | ICD-10-CM | POA: Diagnosis not present

## 2016-12-09 DIAGNOSIS — I272 Pulmonary hypertension, unspecified: Secondary | ICD-10-CM | POA: Diagnosis not present

## 2016-12-09 NOTE — Addendum Note (Signed)
Addended by: Chase Picket A on: 12/09/2016 12:53 PM   Modules accepted: Orders

## 2016-12-09 NOTE — Progress Notes (Signed)
Subjective:    Patient ID: [name], female    DOB: 02-Aug-1935, 82 y.o.   MRN: [id number]  HPI 82 year old woman with a very remote and small tobacco history, history of hypertension, atrial fibrillation, coronary artery disease, psoriasis. She has secondary multifactorial pulmonary hypertension that has been presumed related to obesity with chronic hypoxemia, left-sided heart disease and presumed chronic thromboembolic disease. As noted she does also have a history of autoimmune process, psoriasis.  She is not on any targeted [place] therapy. Last seen by Dr Gwenette Greet here in February 2015. She had a CT scan of the chest 10/05/13 that showed pulmonary nodules, the 2 most dominant were in the left upper lobe, and a subsequent PET scan on 04/11/14 showed that these were not hypermetabolic. I have personally reviewed the scans as well as a repeat CT scan done on 09/27/14 that showed no interval change. She is due for a final scan to confirm stability in February 2017. She underwent a polysomnogram in November 2013 with an AHI of 4.1 and RDI of 5.9.   She reports no SOB, is able to exert. Is able to do housework. She recently had a URI and cough, took 8 weeks to get over the cough. Was treated with pred and abx.   ROV 10/09/15 -- follow-up visit. She has a history of psoriasis, hypertension, atrial fibrillation. He also carries a history of presumed chronic thromboembolic disease. In evaluation for obstructive sleep apnea showed an AHI of 4.1. He has secondary pulmonary hypertension that has been deemed multifactorial. She underwent a repeat CT scan of her chest this month that I have personally reviewed. This showed that his pulmonary nodules are stable to smaller in size. She also had an echocardiogram on 08/30/15 that showed an estimated PASP of 70 mmHg and mild RV dysfunction.  She has occasional SOB but seldom, denies limitations. She uses a walker for balance.    ROV 02/08/16 -- [name] has a  history of hypertension and diastolic dysfunction, atrial fibrillation, psoriasis, and presumed chronic thromboembolic disease (never definitively proven, suggested by VQ scan September 2012). She has multifactorial pulmonary hypertension in the setting of all of this. I started her on adcirca and letaris in March which seem to exacerbate edema, volume overload and finally an exacerbation of her diastolic CHF. She had to be hospitalized for this and her diuretics were adjusted. Note she also has a history of pulmonary nodules which we have followed w serial scans and which are likely benign. She believes that she is much better compensated at this time, now on torsemide instead of furosemide. She is on Eliquis. She is on O2 at night. She is on flonase - still w some congestion. Occasional cough. She feels that she is a bit more active, denies dyspnea.   ROV 06/17/16 -- this is a follow-up visit for patient with a history of multifactorial secondary pulmonary hypertension. His outlined above she has hypertension, diastolic dysfunction, atrial fibrillation, psoriasis and suspected thromboembolic disease. We started her on adcirca and letaris. She could not tolerate the adcirca, but she is on the letaris. She is on eliquis. She is doing cardiopulm rehab and is planning to enter the maintenance program. She has occasional B leg burning, not associated with edema, can be associated w exercise.  She is having congestion and drainage. She has been on flonase before but not now, is currently taking another spray.  She has failed loratadine, zyrtec etc.    ROV 12/09/16 --  patient has a history of multifactorial secondary pulmonary hypertension. She has systemic hypertension, diastolic dysfunction, atrial fibrillation, possible thromboembolic disease, psoriasis. She is currently managed on letaris. She could not tolerate adcirca. She has been seen twice here since last visit for non-resolving cough after a sinusitis. She  has finally had resolution of her cough. Her reflux is well controlled on omeprazole. She is on flonase, does [place] qd. Never seemed to benefit from loratdine, etc. On letaris > labs done 10/17/16, normal. No edema. Wt stable. Gets SOB with heavier activity > walking > 152f at a fast pace. She does a step machine at home.       Right heart cath 12/07/15 RA = 10 RV = 58/2/9  PA = 59/17 (36) PCW = 16 (v wave to 30-35) Fick cardiac output/index = 5.2/2.6 PVR = 3.9 WU Ao sat = 97% PA sat = 65%, 66%   Hepatic Function Latest Ref Rng & Units 10/17/2016 02/10/2016 11/22/2015  Total Protein 6.5 - 8.1 g/dL 6.8 5.5(L) 6.2(L)  Albumin 3.5 - 5.0 g/dL 3.6 3.5 3.6  AST 15 - 41 U/L '15 23 21  ' ALT 14 - 54 U/L 10(L) 10(L) 13(L)  Alk Phosphatase 38 - 126 U/L 55 56 50  Total Bilirubin 0.3 - 1.2 mg/dL 1.3(H) 0.4 1.0  Bilirubin, Direct 0.1 - 0.5 mg/dL - 0.2 -   CBC Latest Ref Rng & Units 10/17/2016 02/10/2016 01/31/2016  WBC 4.0 - 10.5 K/uL 6.8 4.1 3.8  Hemoglobin 12.0 - 15.0 g/dL 10.6(L) 10.7(L) 10.9(L)  Hematocrit 36.0 - 46.0 % 34.7(L) 33.3(L) 33.4(L)  Platelets 150 - 400 K/uL 128(L) 129(L) 126(L)     Review of Systems  Constitutional: Negative for fever and unexpected weight change.  HENT: Negative for congestion, dental problem, ear pain, nosebleeds, postnasal drip, rhinorrhea, sinus pressure, sneezing, sore throat and trouble swallowing.   Eyes: Negative for redness and itching.  Respiratory: Negative for cough, chest tightness, shortness of breath and wheezing.   Cardiovascular: Negative for palpitations and leg swelling.  Gastrointestinal: Negative for nausea and vomiting.  Genitourinary: Negative for dysuria.  Musculoskeletal: Negative for joint swelling.  Skin: Negative for rash.  Neurological: Negative for headaches.  Hematological: Does not bruise/bleed easily.  Psychiatric/Behavioral: Negative for dysphoric mood. The patient is not nervous/anxious.         Objective:   Physical  Exam Vitals:   12/09/16 1207  BP: 114/62  Pulse: 61  SpO2: (!) 87%  Weight: 211 lb 9.6 oz (96 kg)  Height: '5\' 3"'  (1.6 m)   Gen: Pleasant, well-nourished, in no distress,  normal affect  ENT: No lesions,  mouth clear,  oropharynx clear, no postnasal drip  Neck: No JVD, no TMG, no carotid bruits  Lungs: No use of accessory muscles, no dullness to percussion, clear without rales or rhonchi  Cardiovascular: RRR, heart sounds normal, no murmur or gallops, no peripheral edema  Musculoskeletal: No deformities, no cyanosis or clubbing  Neuro: alert, non focal  Skin: Warm, no lesions or rashes     CT chest 10/03/15 --  COMPARISON: 09/27/2014  FINDINGS: Left upper lobe pulmonary nodule on image 21 measures 7.5 mm compared to 6.5 mm previously and 7.5 mm and 2015.  Anterior left upper lobe nodule on image 24 measures up to 9.5 mm compared with 9.5 mm previously.  Small left lower lobe pulmonary nodule peripherally on image 32 is stable. Left lower lobe pulmonary nodule on image 36 measures 6 mm compared with 8 mm previously.  Right  lower lobe pulmonary nodule posteriorly on image 44 measures 4 mm and is stable.  No new pulmonary nodule. Heart is enlarged. Diffuse coronary artery calcifications. Pulmonary arteries are enlarged suggesting pulmonary arterial hypertension, stable. No mediastinal, hilar, or axillary adenopathy. Chest wall soft tissues are unremarkable. Imaging into the upper abdomen shows no acute findings.  No acute bony abnormality or focal bone lesion. Leftward scoliosis and degenerative changes in the thoracic spine.  IMPRESSION: Stable or slightly smaller bilateral pulmonary nodules. No new or enlarging pulmonary nodules. These are stable dating back to February of 2015 and are compatible with benign nodules.  Cardiomegaly, pulmonary arterial hypertension.  Coronary artery disease.      Assessment & Plan:  Chronic rhinitis Likely  driver of her cough following recent siunusitis.   Continue your flonase, nasal saline washes,  You may want to consider starting loratadine 38m (Claritin) daily  Pulmonary hypertension We will repeat your echocardiogram to compare with your priors.  We will perform a 6 minute walk test on room air.  Continue your LMyrtis Hoppingas you are taking it Continue your Eliquis  Follow with Dr BLamonte Sakaiin 2 months or sooner if you have any problems.  RBaltazar Apo MD, PhD 12/09/2016, 12:44 PM [facility] [phone] if no answer [phone]

## 2016-12-09 NOTE — Assessment & Plan Note (Signed)
We will repeat your echocardiogram to compare with your priors.  We will perform a 6 minute walk test on room air.  Continue your Myrtis Hopping as you are taking it Continue your Eliquis  Follow with Dr Lamonte Sakai in 2 months or sooner if you have any problems.

## 2016-12-09 NOTE — Assessment & Plan Note (Signed)
Likely driver of her cough following recent siunusitis.   Continue your flonase, nasal saline washes,  You may want to consider starting loratadine 10mg  (Claritin) daily

## 2016-12-09 NOTE — Patient Instructions (Addendum)
We will repeat your echocardiogram to compare with your priors.  We will perform a 6 minute walk test on room air.  Continue your Myrtis Hopping as you are taking it Continue your Eliquis  Continue your flonase, nasal saline washes, omeprazole  You may want to consider starting loratadine 10mg  (Claritin) daily Follow with Dr Lamonte Sakai in 2 months or sooner if you have any problems.

## 2016-12-20 DIAGNOSIS — Z6837 Body mass index (BMI) 37.0-37.9, adult: Secondary | ICD-10-CM | POA: Diagnosis not present

## 2016-12-20 DIAGNOSIS — I10 Essential (primary) hypertension: Secondary | ICD-10-CM | POA: Diagnosis not present

## 2016-12-20 DIAGNOSIS — K219 Gastro-esophageal reflux disease without esophagitis: Secondary | ICD-10-CM | POA: Diagnosis not present

## 2016-12-20 DIAGNOSIS — B372 Candidiasis of skin and nail: Secondary | ICD-10-CM | POA: Diagnosis not present

## 2016-12-20 DIAGNOSIS — E669 Obesity, unspecified: Secondary | ICD-10-CM | POA: Diagnosis not present

## 2016-12-20 DIAGNOSIS — I4891 Unspecified atrial fibrillation: Secondary | ICD-10-CM | POA: Diagnosis not present

## 2016-12-26 ENCOUNTER — Other Ambulatory Visit: Payer: Medicare Other

## 2016-12-30 ENCOUNTER — Ambulatory Visit (HOSPITAL_COMMUNITY)
Admission: RE | Admit: 2016-12-30 | Discharge: 2016-12-30 | Disposition: A | Discharge status: Home / Self Care | Payer: Medicare Other | Source: Ambulatory Visit | Attending: Emergency Medicine | Admitting: Emergency Medicine

## 2016-12-30 DIAGNOSIS — I272 Pulmonary hypertension, unspecified: Secondary | ICD-10-CM | POA: Diagnosis not present

## 2016-12-30 DIAGNOSIS — I4891 Unspecified atrial fibrillation: Secondary | ICD-10-CM | POA: Diagnosis not present

## 2016-12-30 NOTE — Progress Notes (Signed)
*  PRELIMINARY RESULTS* Echocardiogram 2D Echocardiogram has been performed.  Leavy Cella 12/30/2016, 3:57 PM

## 2017-01-07 DIAGNOSIS — L304 Erythema intertrigo: Secondary | ICD-10-CM | POA: Diagnosis not present

## 2017-01-14 ENCOUNTER — Other Ambulatory Visit (HOSPITAL_COMMUNITY): Payer: Self-pay | Admitting: Internal Medicine

## 2017-01-17 ENCOUNTER — Telehealth: Payer: Self-pay | Admitting: Emergency Medicine

## 2017-01-17 NOTE — Telephone Encounter (Signed)
Pt states that she is on Letaris and regulary gets funding through grant companies which allow her to get her medication free or discounted. Pt states that she no longer had any grant money left and is needing some information on where else she can possible get some assistance for drug coverage. Pt aware that she may be able to contact the actual [employer] for information regarding assistance. Pt aware that a nurse will call her back with some further information.

## 2017-01-17 NOTE — Telephone Encounter (Signed)
Spoke with regarding getting help with her Letaris. Gave her the number to their patient assistance program. Nothing further needed.

## 2017-02-10 ENCOUNTER — Telehealth: Payer: Self-pay | Admitting: Emergency Medicine

## 2017-02-10 NOTE — Telephone Encounter (Signed)
Spoke with pt and she stated she has called the [employer]	[place], [employer], and [employer] about getting funding for her Myrtis Hopping but none have funding. Spoke with Daneil Dan who states pt could try to call her specialty pharmacy and see if they know of any other resources or if the manufacture can help.   Spoke with pt and she will reach out to them and find out if they can help. She had no further questions at this time. Nothing further is needed

## 2017-02-13 ENCOUNTER — Encounter: Payer: Self-pay | Admitting: Emergency Medicine

## 2017-02-13 ENCOUNTER — Ambulatory Visit (INDEPENDENT_AMBULATORY_CARE_PROVIDER_SITE_OTHER): Payer: Medicare Other | Admitting: Emergency Medicine

## 2017-02-13 ENCOUNTER — Ambulatory Visit (INDEPENDENT_AMBULATORY_CARE_PROVIDER_SITE_OTHER): Payer: Medicare Other | Admitting: *Deleted

## 2017-02-13 DIAGNOSIS — R0609 Other forms of dyspnea: Secondary | ICD-10-CM

## 2017-02-13 DIAGNOSIS — I272 Pulmonary hypertension, unspecified: Secondary | ICD-10-CM

## 2017-02-13 NOTE — Progress Notes (Signed)
SIX MIN WALK 02/13/2017 10/09/2015 04/18/2011  Medications Eliquis 5mg , Omeprazole 40mg , Norco 5-325mg  @ 1000-1030 - -  Supplimental Oxygen during Test? (L/min) No No No  Laps 4 - -  Partial Lap (in Meters) 2 - -  Baseline BP (sitting) 124/66 - -  Baseline Heartrate 60 - -  Baseline Dyspnea (Borg Scale) 0 - -  Baseline Fatigue (Borg Scale) 0 - -  Baseline SPO2 96 - -  BP (sitting) 146/64 - -  Heartrate 82 - -  Dyspnea (Borg Scale) 3 - -  Fatigue (Borg Scale) 2 - -  SPO2 92 - -  BP (sitting) 132/62 - -  Heartrate 71 - -  SPO2 95 - -  Stopped or Paused before Six Minutes No - -  Distance Completed 194 - -  Tech Comments: slow pace with a rolling walker.  pt did begin to rely and lean on the rolling walker after the first 1.5laps but never stopped or paused during the test.  pt denied any chest pain, dizziness, hip/leg/calf pain.  pt did note some bilateral knee discomfort that is normal with this amount of activity. Patient completed 2 laps at a normal pace. Patient requested to stop d/t feeling tired and unable to make another lap around the office. No desat. Some noted SOB --AMG Pt wished to stop walking due to "giving out"- states felt slightly SOB and was tired//lmr

## 2017-02-13 NOTE — Assessment & Plan Note (Signed)
Please continue Letaris for now We will check with [facility] to see if there is possible additional financial assistance for the letaris. We will also check to see if bosentan is more affordable alternative. Please send [place] the finalcial paperwork that you get from [place].  Continue eliquis and demedex as you are taking them  Follow with Dr Lamonte Sakai in 2 months or sooner if you have any problems.

## 2017-02-13 NOTE — Patient Instructions (Addendum)
Please continue Letaris for now We will check with [facility] to see if there is possible additional financial assistance for the letaris. We will also check to see if bosentan is more affordable alternative. Please send [place] the finalcial paperwork that you get from [place].  Continue eliquis and demedex as you are taking them  Follow with Dr Lamonte Sakai in 2 months or sooner if you have any problems.

## 2017-02-13 NOTE — Progress Notes (Signed)
Subjective:    Patient ID: [name], female    DOB: Nov 27, 1934, 82 y.o.   MRN: [id number]  HPI 82 year old woman with a very remote and small tobacco history, history of hypertension, atrial fibrillation, coronary artery disease, psoriasis. She has secondary multifactorial pulmonary hypertension that has been presumed related to obesity with chronic hypoxemia, left-sided heart disease and presumed chronic thromboembolic disease. As noted she does also have a history of autoimmune process, psoriasis.  She is not on any targeted [place] therapy. Last seen by Dr Gwenette Greet here in February 2015. She had a CT scan of the chest 10/05/13 that showed pulmonary nodules, the 2 most dominant were in the left upper lobe, and a subsequent PET scan on 04/11/14 showed that these were not hypermetabolic. I have personally reviewed the scans as well as a repeat CT scan done on 09/27/14 that showed no interval change. She is due for a final scan to confirm stability in February 2017. She underwent a polysomnogram in November 2013 with an AHI of 4.1 and RDI of 5.9.   She reports no SOB, is able to exert. Is able to do housework. She recently had a URI and cough, took 8 weeks to get over the cough. Was treated with pred and abx.   ROV 10/09/15 -- follow-up visit. She has a history of psoriasis, hypertension, atrial fibrillation. He also carries a history of presumed chronic thromboembolic disease. In evaluation for obstructive sleep apnea showed an AHI of 4.1. He has secondary pulmonary hypertension that has been deemed multifactorial. She underwent a repeat CT scan of her chest this month that I have personally reviewed. This showed that his pulmonary nodules are stable to smaller in size. She also had an echocardiogram on 08/30/15 that showed an estimated PASP of 70 mmHg and mild RV dysfunction.  She has occasional SOB but seldom, denies limitations. She uses a walker for balance.    ROV 02/08/16 -- [name] has a  history of hypertension and diastolic dysfunction, atrial fibrillation, psoriasis, and presumed chronic thromboembolic disease (never definitively proven, suggested by VQ scan September 2012). She has multifactorial pulmonary hypertension in the setting of all of this. I started her on adcirca and letaris in March which seem to exacerbate edema, volume overload and finally an exacerbation of her diastolic CHF. She had to be hospitalized for this and her diuretics were adjusted. Note she also has a history of pulmonary nodules which we have followed w serial scans and which are likely benign. She believes that she is much better compensated at this time, now on torsemide instead of furosemide. She is on Eliquis. She is on O2 at night. She is on flonase - still w some congestion. Occasional cough. She feels that she is a bit more active, denies dyspnea.   ROV 06/17/16 -- this is a follow-up visit for patient with a history of multifactorial secondary pulmonary hypertension. His outlined above she has hypertension, diastolic dysfunction, atrial fibrillation, psoriasis and suspected thromboembolic disease. We started her on adcirca and letaris. She could not tolerate the adcirca, but she is on the letaris. She is on eliquis. She is doing cardiopulm rehab and is planning to enter the maintenance program. She has occasional B leg burning, not associated with edema, can be associated w exercise.  She is having congestion and drainage. She has been on flonase before but not now, is currently taking another spray.  She has failed loratadine, zyrtec etc.    ROV 12/09/16 --  patient has a history of multifactorial secondary pulmonary hypertension. She has systemic hypertension, diastolic dysfunction, atrial fibrillation, possible thromboembolic disease, psoriasis. He is currently managed on letaris. She could not tolerate adcirca. She has been seen twice here since last visit for non-resolving cough after a sinusitis. She  has finally had resolution of her cough. Her reflux is well controlled on omeprazole. She is on flonase, does [place] qd. Never seemed to benefit from loratdine, etc. On letaris > labs done 10/17/16, normal. No edema. Wt stable. Gets SOB with heavier activity > walking > 131f at a fast pace. She does a step machine at home.       ROV 02/13/17 -- this follow-up visit for multifactorial secondary pulmonary hypertension. She has systemic hypertension, age fibrillation, question thromboembolic disease. She also has autoimmune disease with psoriasis. She has been managed on Letairis. Most recent echocardiogram was 12/30/16, and they're unable to estimate her pulmonary pressures due to incomplete TR jet. 6 minute walk today covered a distance of 194 m. She is wondering about her letaris and the cost. She has been looking for financial assistance resources, none are available. She [place] getting [place] from [place] we will help w this if possible. Also will ask whether bosentan may be more affordable.   Right heart cath 12/07/15 RA = 10 RV = 58/2/9  PA = 59/17 (36) PCW = 16 (v wave to 30-35) Fick cardiac output/index = 5.2/2.6 PVR = 3.9 WU Ao sat = 97% PA sat = 65%, 66%   Hepatic Function Latest Ref Rng & Units 10/17/2016 02/10/2016 11/22/2015  Total Protein 6.5 - 8.1 g/dL 6.8 5.5(L) 6.2(L)  Albumin 3.5 - 5.0 g/dL 3.6 3.5 3.6  AST 15 - 41 U/L '15 23 21  ' ALT 14 - 54 U/L 10(L) 10(L) 13(L)  Alk Phosphatase 38 - 126 U/L 55 56 50  Total Bilirubin 0.3 - 1.2 mg/dL 1.3(H) 0.4 1.0  Bilirubin, Direct 0.1 - 0.5 mg/dL - 0.2 -   CBC Latest Ref Rng & Units 10/17/2016 02/10/2016 01/31/2016  WBC 4.0 - 10.5 K/uL 6.8 4.1 3.8  Hemoglobin 12.0 - 15.0 g/dL 10.6(L) 10.7(L) 10.9(L)  Hematocrit 36.0 - 46.0 % 34.7(L) 33.3(L) 33.4(L)  Platelets 150 - 400 K/uL 128(L) 129(L) 126(L)     Review of Systems  Constitutional: Negative for fever and unexpected weight change.  HENT: Negative for congestion, dental problem, ear pain,  nosebleeds, postnasal drip, rhinorrhea, sinus pressure, sneezing, sore throat and trouble swallowing.   Eyes: Negative for redness and itching.  Respiratory: Negative for cough, chest tightness, shortness of breath and wheezing.   Cardiovascular: Negative for palpitations and leg swelling.  Gastrointestinal: Negative for nausea and vomiting.  Genitourinary: Negative for dysuria.  Musculoskeletal: Negative for joint swelling.  Skin: Negative for rash.  Neurological: Negative for headaches.  Hematological: Does not bruise/bleed easily.  Psychiatric/Behavioral: Negative for dysphoric mood. The patient is not nervous/anxious.         Objective:   Physical Exam Vitals:   02/13/17 1431  BP: 132/62  Pulse: 71  SpO2: 95%  Weight: 205 lb (93 kg)   Gen: Pleasant, well-nourished, in no distress,  normal affect  ENT: No lesions,  mouth clear,  oropharynx clear, no postnasal drip  Neck: No JVD, no TMG, no carotid bruits  Lungs: No use of accessory muscles,clear bilaterally  Cardiovascular: RRR, heart sounds normal, no murmur or gallops, no peripheral edema  Musculoskeletal: No deformities, pain on palpation posterior L costal margin  Neuro: alert, non focal  Skin: Warm, no lesions or rashes     CT chest 10/03/15 --  COMPARISON: 09/27/2014  FINDINGS: Left upper lobe pulmonary nodule on image 21 measures 7.5 mm compared to 6.5 mm previously and 7.5 mm and 2015.  Anterior left upper lobe nodule on image 24 measures up to 9.5 mm compared with 9.5 mm previously.  Small left lower lobe pulmonary nodule peripherally on image 32 is stable. Left lower lobe pulmonary nodule on image 36 measures 6 mm compared with 8 mm previously.  Right lower lobe pulmonary nodule posteriorly on image 44 measures 4 mm and is stable.  No new pulmonary nodule. Heart is enlarged. Diffuse coronary artery calcifications. Pulmonary arteries are enlarged suggesting pulmonary arterial  hypertension, stable. No mediastinal, hilar, or axillary adenopathy. Chest wall soft tissues are unremarkable. Imaging into the upper abdomen shows no acute findings.  No acute bony abnormality or focal bone lesion. Leftward scoliosis and degenerative changes in the thoracic spine.  IMPRESSION: Stable or slightly smaller bilateral pulmonary nodules. No new or enlarging pulmonary nodules. These are stable dating back to February of 2015 and are compatible with benign nodules.  Cardiomegaly, pulmonary arterial hypertension.  Coronary artery disease.      Assessment & Plan:  Pulmonary hypertension Please continue Letaris for now We will check with [facility] to see if there is possible additional financial assistance for the letaris. We will also check to see if bosentan is more affordable alternative. Please send [place] the finalcial paperwork that you get from [place].  Continue eliquis and demedex as you are taking them  Follow with Dr Lamonte Sakai in 2 months or sooner if you have any problems.  Baltazar Apo, MD, PhD 02/13/2017, 3:24 PM [facility] [phone] or if no answer [phone]

## 2017-02-14 ENCOUNTER — Telehealth: Payer: Self-pay | Admitting: Emergency Medicine

## 2017-02-14 MED ORDER — AMBRISENTAN 5 MG PO TABS: 5.0000 mg | ORAL_TABLET | Freq: Every day | ORAL | 5 refills | Status: DC

## 2017-02-14 NOTE — Telephone Encounter (Signed)
Spoke with pharmacist, letairis refill called in to pharmacy- pt to continue letairis per yesterday's phone note.  Nothing further needed.

## 2017-02-18 ENCOUNTER — Telehealth: Payer: Self-pay | Admitting: Emergency Medicine

## 2017-02-18 MED ORDER — AMBRISENTAN 5 MG PO TABS: 5.0000 mg | ORAL_TABLET | Freq: Every day | ORAL | 11 refills | Status: DC

## 2017-02-18 NOTE — Telephone Encounter (Signed)
Spoke with pt, states she is changing pharmacies for her [facility] d/t her grant situation- asked for [place] to call [facility] to send in a new rx.    Called [facility], states that they need a new rx for [employer].    This has been e-scribed. Pt aware.  Nothing further needed.

## 2017-02-27 DIAGNOSIS — E669 Obesity, unspecified: Secondary | ICD-10-CM | POA: Diagnosis not present

## 2017-02-27 DIAGNOSIS — Z6836 Body mass index (BMI) 36.0-36.9, adult: Secondary | ICD-10-CM | POA: Diagnosis not present

## 2017-02-27 DIAGNOSIS — R300 Dysuria: Secondary | ICD-10-CM | POA: Diagnosis not present

## 2017-03-07 DIAGNOSIS — Z6837 Body mass index (BMI) 37.0-37.9, adult: Secondary | ICD-10-CM | POA: Diagnosis not present

## 2017-03-07 DIAGNOSIS — Z0000 Encounter for general adult medical examination without abnormal findings: Secondary | ICD-10-CM | POA: Diagnosis not present

## 2017-03-28 DIAGNOSIS — Z6836 Body mass index (BMI) 36.0-36.9, adult: Secondary | ICD-10-CM | POA: Diagnosis not present

## 2017-03-28 DIAGNOSIS — M1991 Primary osteoarthritis, unspecified site: Secondary | ICD-10-CM | POA: Diagnosis not present

## 2017-03-28 DIAGNOSIS — B372 Candidiasis of skin and nail: Secondary | ICD-10-CM | POA: Diagnosis not present

## 2017-03-28 DIAGNOSIS — I272 Pulmonary hypertension, unspecified: Secondary | ICD-10-CM | POA: Diagnosis not present

## 2017-03-28 DIAGNOSIS — I4891 Unspecified atrial fibrillation: Secondary | ICD-10-CM | POA: Diagnosis not present

## 2017-03-28 DIAGNOSIS — I10 Essential (primary) hypertension: Secondary | ICD-10-CM | POA: Diagnosis not present

## 2017-04-18 ENCOUNTER — Encounter: Payer: Self-pay | Admitting: Emergency Medicine

## 2017-04-18 ENCOUNTER — Ambulatory Visit (INDEPENDENT_AMBULATORY_CARE_PROVIDER_SITE_OTHER): Payer: Medicare Other | Admitting: Emergency Medicine

## 2017-04-18 VITALS — BP 112/64 | HR 55 | Ht 63.0 in | Wt 200.8 lb

## 2017-04-18 DIAGNOSIS — I2721 Secondary pulmonary arterial hypertension: Secondary | ICD-10-CM

## 2017-04-18 NOTE — Progress Notes (Signed)
Subjective:    Patient ID: [name], female    DOB: 10/25/1934, 82 y.o.   MRN: [id number]  HPI 82 year old woman with a very remote and small tobacco history, history of hypertension, atrial fibrillation, coronary artery disease, psoriasis. She has secondary multifactorial pulmonary hypertension that has been presumed related to obesity with chronic hypoxemia, left-sided heart disease and presumed chronic thromboembolic disease. As noted she does also have a history of autoimmune process, psoriasis.  She is not on any targeted [place] therapy. Last seen by Dr Gwenette Greet here in February 2015. She had a CT scan of the chest 10/05/13 that showed pulmonary nodules, the 2 most dominant were in the left upper lobe, and a subsequent PET scan on 04/11/14 showed that these were not hypermetabolic. I have personally reviewed the scans as well as a repeat CT scan done on 09/27/14 that showed no interval change. She is due for a final scan to confirm stability in February 2017. She underwent a polysomnogram in November 2013 with an AHI of 4.1 and RDI of 5.9.   She reports no SOB, is able to exert. Is able to do housework. She recently had a URI and cough, took 8 weeks to get over the cough. Was treated with pred and abx.   ROV 10/09/15 -- follow-up visit. She has a history of psoriasis, hypertension, atrial fibrillation. He also carries a history of presumed chronic thromboembolic disease. In evaluation for obstructive sleep apnea showed an AHI of 4.1. He has secondary pulmonary hypertension that has been deemed multifactorial. She underwent a repeat CT scan of her chest this month that I have personally reviewed. This showed that his pulmonary nodules are stable to smaller in size. She also had an echocardiogram on 08/30/15 that showed an estimated PASP of 70 mmHg and mild RV dysfunction.  She has occasional SOB but seldom, denies limitations. She uses a walker for balance.    ROV 02/08/16 -- [name] has a  history of hypertension and diastolic dysfunction, atrial fibrillation, psoriasis, and presumed chronic thromboembolic disease (never definitively proven, suggested by VQ scan September 2012). She has multifactorial pulmonary hypertension in the setting of all of this. I started her on adcirca and letaris in March which seem to exacerbate edema, volume overload and finally an exacerbation of her diastolic CHF. She had to be hospitalized for this and her diuretics were adjusted. Note she also has a history of pulmonary nodules which we have followed w serial scans and which are likely benign. She believes that she is much better compensated at this time, now on torsemide instead of furosemide. She is on Eliquis. She is on O2 at night. She is on flonase - still w some congestion. Occasional cough. She feels that she is a bit more active, denies dyspnea.   ROV 06/17/16 -- this is a follow-up visit for patient with a history of multifactorial secondary pulmonary hypertension. His outlined above she has hypertension, diastolic dysfunction, atrial fibrillation, psoriasis and suspected thromboembolic disease. We started her on adcirca and letaris. She could not tolerate the adcirca, but she is on the letaris. She is on eliquis. She is doing cardiopulm rehab and is planning to enter the maintenance program. She has occasional B leg burning, not associated with edema, can be associated w exercise.  She is having congestion and drainage. She has been on flonase before but not now, is currently taking another spray.  She has failed loratadine, zyrtec etc.    ROV 12/09/16 --  patient has a history of multifactorial secondary pulmonary hypertension. She has systemic hypertension, diastolic dysfunction, atrial fibrillation, possible thromboembolic disease, psoriasis. He is currently managed on letaris. She could not tolerate adcirca. She has been seen twice here since last visit for non-resolving cough after a sinusitis. She  has finally had resolution of her cough. Her reflux is well controlled on omeprazole. She is on flonase, does [place] qd. Never seemed to benefit from loratdine, etc. On letaris > labs done 10/17/16, normal. No edema. Wt stable. Gets SOB with heavier activity > walking > 155f at a fast pace. She does a step machine at home.       ROV 02/13/17 -- this follow-up visit for multifactorial secondary pulmonary hypertension. She has systemic hypertension, age fibrillation, question thromboembolic disease. She also has autoimmune disease with psoriasis. She has been managed on Letairis. Most recent echocardiogram was 12/30/16, and they're unable to estimate her pulmonary pressures due to incomplete TR jet. 6 minute walk today covered a distance of 194 m. She is wondering about her letaris and the cost. She has been looking for financial assistance resources, none are available. [place] we will help w this if possible. Also will ask whether bosentan may be more affordable.   ROV 04/18/17 -- patient has a history of multifactorial secondary pulmonary hypertension. This is in the setting of systemic hypertension, atrial fibrillation, autoimmune disease with psoriasis, possible thromboembolic disease. She has been managed on Letairis. Cost has been a problem. - she now has been able to get financial support until christmas. She uses o2 at night. She occasionally has flushing.                                                                                                                                                                                                                                                                               Right heart cath 12/07/15 RA = 10 RV = 58/2/9  PA = 59/17 (36) PCW = 16 (v wave to 30-35) Fick cardiac output/index = 5.2/2.6 PVR = 3.9 WU Ao sat = 97% PA sat = 65%, 66%   Hepatic Function Latest Ref Rng & Units 10/17/2016  02/10/2016 11/22/2015  Total  Protein 6.5 - 8.1 g/dL 6.8 5.5(L) 6.2(L)  Albumin 3.5 - 5.0 g/dL 3.6 3.5 3.6  AST 15 - 41 U/L _0 ALT 14 - 54 U/L 10(L) 10(L) 13(L)  Alk Phosphatase 38 - 126 U/L 55 56 50  Total Bilirubin 0.3 - 1.2 mg/dL 1.3(H) 0.4 1.0  Bilirubin, Direct 0.1 - 0.5 mg/dL - 0.2 -   CBC Latest Ref Rng & Units 10/17/2016 02/10/2016 01/31/2016  WBC 4.0 - 10.5 K/uL 6.8 4.1 3.8  Hemoglobin 12.0 - 15.0 g/dL 10.6(L) 10.7(L) 10.9(L)  Hematocrit 36.0 - 46.0 % 34.7(L) 33.3(L) 33.4(L)  Platelets 150 - 400 K/uL 128(L) 129(L) 126(L)     Review of Systems  Constitutional: Negative for fever and unexpected weight change.  HENT: Negative for congestion, dental problem, ear pain, nosebleeds, postnasal drip, rhinorrhea, sinus pressure, sneezing, sore throat and trouble swallowing.   Eyes: Negative for redness and itching.  Respiratory: Negative for cough, chest tightness, shortness of breath and wheezing.   Cardiovascular: Negative for palpitations and leg swelling.  Gastrointestinal: Negative for nausea and vomiting.  Genitourinary: Negative for dysuria.  Musculoskeletal: Negative for joint swelling.  Skin: Negative for rash.  Neurological: Negative for headaches.  Hematological: Does not bruise/bleed easily.  Psychiatric/Behavioral: Negative for dysphoric mood. The patient is not nervous/anxious.         Objective:   Physical Exam Vitals:   04/18/17 1555  BP: 112/64  Pulse: (!) 55  SpO2: 95%  Weight: 200 lb 12.8 oz (91.1 kg)  Height: _1  (1.6 m)   Gen: Pleasant, well-nourished, in no distress,  normal affect  ENT: No lesions,  mouth clear,  oropharynx clear, no postnasal drip  Neck: No JVD, no TMG, no carotid bruits  Lungs: No use of accessory muscles,clear bilaterally  Cardiovascular: RRR, heart sounds normal, no murmur or gallops, no peripheral edema  Musculoskeletal: No deformities, pain on palpation posterior L costal margin  Neuro: alert, non focal  Skin: Warm, no lesions or  rashes     CT chest 10/03/15 --  COMPARISON: 09/27/2014  FINDINGS: Left upper lobe pulmonary nodule on image 21 measures 7.5 mm compared to 6.5 mm previously and 7.5 mm and 2015.  Anterior left upper lobe nodule on image 24 measures up to 9.5 mm compared with 9.5 mm previously.  Small left lower lobe pulmonary nodule peripherally on image 32 is stable. Left lower lobe pulmonary nodule on image 36 measures 6 mm compared with 8 mm previously.  Right lower lobe pulmonary nodule posteriorly on image 44 measures 4 mm and is stable.  No new pulmonary nodule. Heart is enlarged. Diffuse coronary artery calcifications. Pulmonary arteries are enlarged suggesting pulmonary arterial hypertension, stable. No mediastinal, hilar, or axillary adenopathy. Chest wall soft tissues are unremarkable. Imaging into the upper abdomen shows no acute findings.  No acute bony abnormality or focal bone lesion. Leftward scoliosis and degenerative changes in the thoracic spine.  IMPRESSION: Stable or slightly smaller bilateral pulmonary nodules. No new or enlarging pulmonary nodules. These are stable dating back to February of 2015 and are compatible with benign nodules.  Cardiomegaly, pulmonary arterial hypertension.  Coronary artery disease.      Assessment & Plan:  No problem-specific Assessment & Plan notes found for this encounter.  Baltazar Apo, MD, PhD 04/18/2017, 4:28 PM [facility] [phone] or if no answer [phone]

## 2017-04-18 NOTE — Patient Instructions (Addendum)
Please continue your letaris as you are taking it.  Continue your oxygen at night.  Continue eliquis as you are taking it Follow in December or sooner if any problems.

## 2017-05-06 DIAGNOSIS — Z23 Encounter for immunization: Secondary | ICD-10-CM | POA: Diagnosis not present

## 2017-05-10 ENCOUNTER — Emergency Department (HOSPITAL_COMMUNITY): Payer: Medicare Other

## 2017-05-10 ENCOUNTER — Inpatient Hospital Stay (HOSPITAL_COMMUNITY)
Admission: EM | Admit: 2017-05-10 | Discharge: 2017-05-16 | DRG: 604 | Disposition: A | Discharge status: Skilled Nursing Facility | Payer: Medicare Other | Attending: Internal Medicine | Admitting: Internal Medicine

## 2017-05-10 ENCOUNTER — Encounter (HOSPITAL_COMMUNITY): Payer: Self-pay | Admitting: Emergency Medicine

## 2017-05-10 DIAGNOSIS — J9611 Chronic respiratory failure with hypoxia: Secondary | ICD-10-CM

## 2017-05-10 DIAGNOSIS — I2510 Atherosclerotic heart disease of native coronary artery without angina pectoris: Secondary | ICD-10-CM | POA: Diagnosis not present

## 2017-05-10 DIAGNOSIS — M7989 Other specified soft tissue disorders: Secondary | ICD-10-CM | POA: Diagnosis not present

## 2017-05-10 DIAGNOSIS — W010XXA Fall on same level from slipping, tripping and stumbling without subsequent striking against object, initial encounter: Secondary | ICD-10-CM | POA: Diagnosis present

## 2017-05-10 DIAGNOSIS — N183 Chronic kidney disease, stage 3 unspecified: Secondary | ICD-10-CM | POA: Diagnosis present

## 2017-05-10 DIAGNOSIS — Z87891 Personal history of nicotine dependence: Secondary | ICD-10-CM

## 2017-05-10 DIAGNOSIS — I130 Hypertensive heart and chronic kidney disease with heart failure and stage 1 through stage 4 chronic kidney disease, or unspecified chronic kidney disease: Secondary | ICD-10-CM | POA: Diagnosis present

## 2017-05-10 DIAGNOSIS — M25462 Effusion, left knee: Secondary | ICD-10-CM | POA: Diagnosis not present

## 2017-05-10 DIAGNOSIS — K219 Gastro-esophageal reflux disease without esophagitis: Secondary | ICD-10-CM | POA: Diagnosis present

## 2017-05-10 DIAGNOSIS — I481 Persistent atrial fibrillation: Secondary | ICD-10-CM | POA: Diagnosis present

## 2017-05-10 DIAGNOSIS — D62 Acute posthemorrhagic anemia: Secondary | ICD-10-CM | POA: Diagnosis not present

## 2017-05-10 DIAGNOSIS — Y92091 Bathroom in other non-institutional residence as the place of occurrence of the external cause: Secondary | ICD-10-CM

## 2017-05-10 DIAGNOSIS — R571 Hypovolemic shock: Secondary | ICD-10-CM | POA: Diagnosis present

## 2017-05-10 DIAGNOSIS — Z7901 Long term (current) use of anticoagulants: Secondary | ICD-10-CM

## 2017-05-10 DIAGNOSIS — D6959 Other secondary thrombocytopenia: Secondary | ICD-10-CM | POA: Diagnosis present

## 2017-05-10 DIAGNOSIS — S7012XA Contusion of left thigh, initial encounter: Secondary | ICD-10-CM | POA: Diagnosis not present

## 2017-05-10 DIAGNOSIS — S8012XA Contusion of left lower leg, initial encounter: Secondary | ICD-10-CM | POA: Diagnosis present

## 2017-05-10 DIAGNOSIS — I482 Chronic atrial fibrillation, unspecified: Secondary | ICD-10-CM

## 2017-05-10 DIAGNOSIS — I252 Old myocardial infarction: Secondary | ICD-10-CM

## 2017-05-10 DIAGNOSIS — I5033 Acute on chronic diastolic (congestive) heart failure: Secondary | ICD-10-CM | POA: Diagnosis present

## 2017-05-10 DIAGNOSIS — I959 Hypotension, unspecified: Secondary | ICD-10-CM

## 2017-05-10 DIAGNOSIS — Z9981 Dependence on supplemental oxygen: Secondary | ICD-10-CM

## 2017-05-10 DIAGNOSIS — Z8249 Family history of ischemic heart disease and other diseases of the circulatory system: Secondary | ICD-10-CM

## 2017-05-10 DIAGNOSIS — I2721 Secondary pulmonary arterial hypertension: Secondary | ICD-10-CM | POA: Diagnosis not present

## 2017-05-10 DIAGNOSIS — E785 Hyperlipidemia, unspecified: Secondary | ICD-10-CM | POA: Diagnosis present

## 2017-05-10 DIAGNOSIS — I10 Essential (primary) hypertension: Secondary | ICD-10-CM | POA: Diagnosis present

## 2017-05-10 DIAGNOSIS — I5032 Chronic diastolic (congestive) heart failure: Secondary | ICD-10-CM | POA: Diagnosis present

## 2017-05-10 DIAGNOSIS — Z823 Family history of stroke: Secondary | ICD-10-CM

## 2017-05-10 DIAGNOSIS — J9610 Chronic respiratory failure, unspecified whether with hypoxia or hypercapnia: Secondary | ICD-10-CM | POA: Diagnosis present

## 2017-05-10 DIAGNOSIS — I272 Pulmonary hypertension, unspecified: Secondary | ICD-10-CM

## 2017-05-10 DIAGNOSIS — Z955 Presence of coronary angioplasty implant and graft: Secondary | ICD-10-CM

## 2017-05-10 DIAGNOSIS — I4891 Unspecified atrial fibrillation: Secondary | ICD-10-CM | POA: Diagnosis present

## 2017-05-10 LAB — CBC WITH DIFFERENTIAL/PLATELET
BASOS ABS: 0 10*3/uL (ref 0.0–0.1)
Basophils Relative: 0 %
EOS ABS: 0.1 10*3/uL (ref 0.0–0.7)
EOS PCT: 1 %
HCT: 30.8 % — ABNORMAL LOW (ref 36.0–46.0)
Hemoglobin: 9.4 g/dL — ABNORMAL LOW (ref 12.0–15.0)
Lymphocytes Relative: 14 %
Lymphs Abs: 1.2 10*3/uL (ref 0.7–4.0)
MCH: 28.7 pg (ref 26.0–34.0)
MCHC: 30.5 g/dL (ref 30.0–36.0)
MCV: 93.9 fL (ref 78.0–100.0)
Monocytes Absolute: 0.3 10*3/uL (ref 0.1–1.0)
Monocytes Relative: 3 %
NEUTROS PCT: 82 %
Neutro Abs: 7.2 10*3/uL (ref 1.7–7.7)
PLATELETS: 129 10*3/uL — AB (ref 150–400)
RBC: 3.28 MIL/uL — AB (ref 3.87–5.11)
RDW: 14.1 % (ref 11.5–15.5)
WBC: 8.8 10*3/uL (ref 4.0–10.5)

## 2017-05-10 LAB — BASIC METABOLIC PANEL
ANION GAP: 8 (ref 5–15)
BUN: 76 mg/dL — ABNORMAL HIGH (ref 6–20)
CO2: 27 mmol/L (ref 22–32)
Calcium: 9.5 mg/dL (ref 8.9–10.3)
Chloride: 105 mmol/L (ref 101–111)
Creatinine, Ser: 1.69 mg/dL — ABNORMAL HIGH (ref 0.44–1.00)
GFR calc Af Amer: 32 mL/min — ABNORMAL LOW (ref >60)
GFR, EST NON AFRICAN AMERICAN: 27 mL/min — AB (ref >60)
Glucose, Bld: 125 mg/dL — ABNORMAL HIGH (ref 65–99)
POTASSIUM: 4.5 mmol/L (ref 3.5–5.1)
SODIUM: 140 mmol/L (ref 135–145)

## 2017-05-10 MED ORDER — ATORVASTATIN CALCIUM 40 MG PO TABS
40.0000 mg | ORAL_TABLET | Freq: Every day | ORAL | Status: DC
  Administered 2017-05-10 – 2017-05-16 (×7): 40 mg via ORAL
  Filled 2017-05-10 (×7): qty 1

## 2017-05-10 MED ORDER — TIMOLOL MALEATE 0.5 % OP SOLN
1.0000 [drp] | Freq: Two times a day (BID) | OPHTHALMIC | Status: DC
  Administered 2017-05-10 – 2017-05-16 (×12): 1 [drp] via OPHTHALMIC
  Filled 2017-05-10 (×2): qty 5

## 2017-05-10 MED ORDER — LOSARTAN POTASSIUM 50 MG PO TABS
100.0000 mg | ORAL_TABLET | Freq: Every evening | ORAL | Status: DC
  Administered 2017-05-10: 100 mg via ORAL
  Filled 2017-05-10: qty 2

## 2017-05-10 MED ORDER — ALBUTEROL SULFATE 2 MG PO TABS
2.0000 mg | ORAL_TABLET | Freq: Three times a day (TID) | ORAL | Status: DC
  Administered 2017-05-11: 2 mg via ORAL
  Filled 2017-05-10 (×2): qty 1

## 2017-05-10 MED ORDER — ACETAMINOPHEN 325 MG PO TABS
650.0000 mg | ORAL_TABLET | Freq: Four times a day (QID) | ORAL | Status: DC | PRN
  Administered 2017-05-12: 650 mg via ORAL
  Filled 2017-05-10: qty 2

## 2017-05-10 MED ORDER — SODIUM CHLORIDE 0.9 % IV SOLN: 250.0000 mL | INTRAVENOUS | Status: DC | PRN

## 2017-05-10 MED ORDER — ONDANSETRON HCL 4 MG PO TABS: 4.0000 mg | ORAL_TABLET | Freq: Four times a day (QID) | ORAL | Status: DC | PRN

## 2017-05-10 MED ORDER — ONDANSETRON HCL 4 MG/2ML IJ SOLN: 4.0000 mg | Freq: Four times a day (QID) | INTRAMUSCULAR | Status: DC | PRN

## 2017-05-10 MED ORDER — SODIUM CHLORIDE 0.9% FLUSH
3.0000 mL | Freq: Two times a day (BID) | INTRAVENOUS | Status: DC
  Administered 2017-05-11 – 2017-05-12 (×2): 3 mL via INTRAVENOUS

## 2017-05-10 MED ORDER — SODIUM CHLORIDE 0.9% FLUSH: 3.0000 mL | INTRAVENOUS | Status: DC | PRN

## 2017-05-10 MED ORDER — MORPHINE SULFATE (PF) 4 MG/ML IV SOLN
2.0000 mg | INTRAVENOUS | Status: DC | PRN
  Administered 2017-05-11: 2 mg via INTRAVENOUS
  Filled 2017-05-10: qty 1

## 2017-05-10 MED ORDER — BRIMONIDINE TARTRATE 0.2 % OP SOLN
1.0000 [drp] | Freq: Two times a day (BID) | OPHTHALMIC | Status: DC
  Administered 2017-05-10 – 2017-05-16 (×12): 1 [drp] via OPHTHALMIC
  Filled 2017-05-10 (×2): qty 5

## 2017-05-10 MED ORDER — PANTOPRAZOLE SODIUM 40 MG PO TBEC
40.0000 mg | DELAYED_RELEASE_TABLET | Freq: Every day | ORAL | Status: DC
  Administered 2017-05-11 – 2017-05-16 (×6): 40 mg via ORAL
  Filled 2017-05-10 (×6): qty 1

## 2017-05-10 MED ORDER — SENNOSIDES-DOCUSATE SODIUM 8.6-50 MG PO TABS
1.0000 | ORAL_TABLET | Freq: Every evening | ORAL | Status: DC | PRN
  Administered 2017-05-13: 1 via ORAL
  Filled 2017-05-10 (×2): qty 1

## 2017-05-10 MED ORDER — BRINZOLAMIDE 1 % OP SUSP
1.0000 [drp] | Freq: Three times a day (TID) | OPHTHALMIC | Status: DC
  Administered 2017-05-10 – 2017-05-11 (×2): 1 [drp] via OPHTHALMIC
  Filled 2017-05-10: qty 10

## 2017-05-10 MED ORDER — ALPRAZOLAM 0.5 MG PO TABS
0.5000 mg | ORAL_TABLET | Freq: Every day | ORAL | Status: DC
  Administered 2017-05-10 – 2017-05-15 (×5): 0.5 mg via ORAL
  Filled 2017-05-10 (×3): qty 1
  Filled 2017-05-10: qty 2
  Filled 2017-05-10 (×2): qty 1

## 2017-05-10 MED ORDER — ALUM HYDROXIDE-MAG TRISILICATE 80-20 MG PO CHEW: 1.0000 | CHEWABLE_TABLET | Freq: Three times a day (TID) | ORAL | Status: DC | PRN

## 2017-05-10 MED ORDER — BISACODYL 5 MG PO TBEC
5.0000 mg | DELAYED_RELEASE_TABLET | Freq: Every day | ORAL | Status: DC | PRN
  Administered 2017-05-13: 5 mg via ORAL
  Filled 2017-05-10: qty 1

## 2017-05-10 MED ORDER — FLUTICASONE PROPIONATE 50 MCG/ACT NA SUSP
2.0000 | Freq: Every day | NASAL | Status: DC
  Administered 2017-05-11 – 2017-05-16 (×6): 2 via NASAL
  Filled 2017-05-10 (×2): qty 16

## 2017-05-10 MED ORDER — LATANOPROST 0.005 % OP SOLN
1.0000 [drp] | Freq: Every day | OPHTHALMIC | Status: DC
  Administered 2017-05-10 – 2017-05-15 (×6): 1 [drp] via OPHTHALMIC
  Filled 2017-05-10 (×2): qty 2.5

## 2017-05-10 MED ORDER — AMBRISENTAN 5 MG PO TABS: 5.0000 mg | ORAL_TABLET | Freq: Every day | ORAL | Status: DC

## 2017-05-10 MED ORDER — ACETAMINOPHEN 650 MG RE SUPP: 650.0000 mg | Freq: Four times a day (QID) | RECTAL | Status: DC | PRN

## 2017-05-10 MED ORDER — HYDROCODONE-ACETAMINOPHEN 5-325 MG PO TABS
1.0000 | ORAL_TABLET | Freq: Four times a day (QID) | ORAL | Status: DC | PRN
  Administered 2017-05-11 – 2017-05-16 (×7): 1 via ORAL
  Filled 2017-05-10 (×8): qty 1

## 2017-05-10 MED ORDER — LIDOCAINE 5 % EX OINT: 1.0000 "application " | TOPICAL_OINTMENT | Freq: Every day | CUTANEOUS | Status: DC | PRN

## 2017-05-10 MED ORDER — MORPHINE SULFATE (PF) 4 MG/ML IV SOLN
4.0000 mg | Freq: Once | INTRAVENOUS | Status: AC
  Administered 2017-05-10: 4 mg via INTRAVENOUS
  Filled 2017-05-10: qty 1

## 2017-05-10 MED ORDER — ALUM HYDROXIDE-MAG CARBONATE 95-358 MG/15ML PO SUSP: 15.0000 mL | Freq: Three times a day (TID) | ORAL | Status: DC | PRN

## 2017-05-10 MED ORDER — BENZONATATE 100 MG PO CAPS: 200.0000 mg | ORAL_CAPSULE | Freq: Three times a day (TID) | ORAL | Status: DC | PRN

## 2017-05-10 NOTE — H&P (Signed)
History and Physical    [place] DVV:[id number] DOB: 11-Sep-1934 DOA: 05/10/2017  PCP: Redmond School, MD   Patient coming from: Home  Chief Complaint: Bruising and swelling to LLE after a fall   HPI: [name] is a 82 y.o. female with medical history significant for atrial fibrillation on Eliquis coronary artery disease, pulmonary hypertension, chronic kidney disease stage III, chronic diastolic CHF, and chronic anemia,  now presenting to the emergency department for evaluation of rapidly worsening swelling and discoloration to the left leg after a ground-level mechanical fall at home. Patient had been in her usual state of health until she tripped earlier today, hitting her left knee. There is no loss of consciousness. Since that time, she has developed progressive swelling and discoloration around the left knee. She denies lightheadedness, chest pain, or dyspnea. No recent fevers or chills. No melena or hematochezia.   ED Course: Upon arrival to the ED, patient is found to be afebrile, saturating well on room air, and with vitals otherwise stable. Chemistry panel reveals a serum creatinine 1.69, up from 1.35 in March of this year. CBC is notable for a chronic anemia with hemoglobin of 9.4, down from 10.6 several months ago. Also noted on CBC is a stable mild thrombocytopenia with platelets 129,000. Radiographs of the left knee and lower leg are negative for definite acute osseous abnormality, but notable for a small suprapatellar effusion and a hematoma versus soft tissue mass at the medial aspect of the left knee. Also noted on the radiographs as chondrocalcinosis with moderate to marked degenerative changes of the left knee. Patient remained hemodynamically stable in the ED and in no apparent respiratory distress. There was concern for developing compartment syndrome and surgery was consulted by the ED physician. She will be observed on medical-surgical unit for ongoing  evaluation and management of traumatic intramuscular hematoma in a patient on a loquacious with concern for developing ACS.  Review of Systems:  All other systems reviewed and apart from HPI, are negative.  Past Medical History:  Diagnosis Date  . A-fib ([place])   . Arthritis    "knees" (11/17/2015)  . CAD (coronary artery disease)    BMS to LAD 07/2005  . Chronic atrial fibrillation ([place])    Since 2011  . Degenerative arthritis of right knee   . Essential hypertension   . GERD (gastroesophageal reflux disease)   . Hyperlipidemia   . Morbid obesity ([place])   . Myocardial infarction ([place]) 07/2005  . On home oxygen therapy    "2.5L at night" (11/17/2015)  . Psoriasis   . Pulmonary hypertension ([facility])    Mixed, PASP 67 mmHg at [facility] 2013  . Right ventricular dysfunction   . Tricuspid regurgitation    Severe    Past Surgical History:  Procedure Laterality Date  . ACHILLES TENDON SURGERY Right 1970s  . CARDIAC CATHETERIZATION N/A 12/07/2015   Procedure: Right Heart Cath;  Surgeon: Jolaine Artist, MD;  Location: [place];  Service: Cardiovascular;  Laterality: N/A;  . CARPAL TUNNEL RELEASE  11/29/2011   Procedure: CARPAL TUNNEL RELEASE;  Surgeon: Cammie Sickle., MD;  Location: [facility];  Service: Orthopedics;  Laterality: Right;  . COLONOSCOPY    . CORONARY ANGIOPLASTY WITH STENT PLACEMENT  07/2005   Dr. Olevia Perches  . EYE SURGERY    . LAPAROSCOPIC CHOLECYSTECTOMY    . RETINAL DETACHMENT SURGERY Right      reports that she quit smoking about 30 years  ago. Her smoking use included Cigarettes. She has a 0.15 pack-year smoking history. She has never used smokeless tobacco. She reports that she does not drink alcohol or use drugs.  No Known Allergies  Family History  Problem Relation Age of Onset  . Heart attack Mother   . Coronary artery disease Father   . Stroke Father   . Heart attack Brother   . Heart failure Brother   . COPD Brother   . Emphysema  Sister   . Emphysema Brother      Prior to Admission medications   Medication Sig Start Date End Date Taking? Authorizing Provider  albuterol (PROVENTIL) 2 MG tablet Take 1 tablet by mouth 3 (three) times daily. 10/01/16   [provider]  ALPRAZolam Duanne Moron) 0.5 MG tablet Take 0.5 mg by mouth at bedtime.     [provider]  alum hydroxide-mag trisilicate (GAVISCON) 40-98 MG CHEW chewable tablet Chew 1 tablet by mouth 3 (three) times daily as needed for indigestion or heartburn.    [provider]  ambrisentan (LETAIRIS) 5 MG tablet Take 1 tablet (5 mg total) by mouth daily. 02/18/17   Collene Gobble, MD  atorvastatin (LIPITOR) 80 MG tablet TAKE 1/2 TABLET BY MOUTH DAILY 08/07/15   Satira Sark, MD  AZOPT 1 % ophthalmic suspension Place 1 drop into both eyes 3 (three) times daily.  05/02/12   [provider]  benzonatate (TESSALON) 200 MG capsule Take 1 capsule (200 mg total) by mouth 3 (three) times daily as needed for cough. 10/11/16 10/11/17  Parrett, Fonnie Mu, NP  brimonidine (ALPHAGAN) 0.2 % ophthalmic solution Place 1 drop into both eyes 2 (two) times daily.  04/21/15   [provider]  Coenzyme Q10 (COQ10) 200 MG CAPS Take 200 mg by mouth daily.    [provider]  colchicine 0.6 MG tablet Take 0.6 mg by mouth daily.    [provider]  ELIQUIS 5 MG TABS tablet TAKE 1 TABLET BY MOUTH TWICE DAILY 01/15/17   Bensimhon, Shaune Pascal, MD  fluticasone (FLONASE) 50 MCG/ACT nasal spray Place 2 sprays into both nostrils daily.  05/29/15   [provider]  HYDROcodone-acetaminophen (NORCO/VICODIN) 5-325 MG tablet Take 1 tablet by mouth 2 (two) times daily as needed for moderate pain.  03/26/15   [provider]  latanoprost (XALATAN) 0.005 % ophthalmic solution Place 1 drop into the right eye at bedtime.  03/30/12   [provider]  lidocaine (XYLOCAINE) 5 % ointment Apply 1 application topically daily as needed (knee  pain).  04/10/15   [provider]  losartan (COZAAR) 100 MG tablet Take 100 mg by mouth every evening.    [provider]  nitroGLYCERIN (NITROSTAT) 0.4 MG SL tablet Place 0.4 mg under the tongue every 5 (five) minutes as needed for chest pain (x 3 tablets daily). Reported on 12/28/2015    [provider]  omeprazole (PRILOSEC) 40 MG capsule Take 40 mg by mouth daily.    [provider]  ondansetron (ZOFRAN ODT) 8 MG disintegrating tablet Take 1 tablet (8 mg total) by mouth every 8 (eight) hours as needed for nausea or vomiting. 02/10/16   Lajean Saver, MD  pantoprazole (PROTONIX) 20 MG tablet Take 20 mg by mouth daily. Reported on 02/10/2016    [provider]  potassium chloride SA (K-DUR,KLOR-CON) 20 MEQ tablet TAKE 1 TABLET BY MOUTH EVERY DAY 12/10/16   Bensimhon, Shaune Pascal, MD  Probiotic Product (PROBIOTIC PO) Take  1 tablet by mouth 2 (two) times daily.    [provider]  spironolactone (ALDACTONE) 25 MG tablet TAKE 1/2 TABLET BY MOUTH EVERY DAY 10/23/16   Bensimhon, Shaune Pascal, MD  timolol (TIMOPTIC) 0.5 % ophthalmic solution Place 1 drop into both eyes 2 (two) times daily. 11/20/15   [provider]  torsemide (DEMADEX) 20 MG tablet TAKE 2 TABLETS BY MOUTH EVERY DAY 05/20/16   Bensimhon, Shaune Pascal, MD    Physical Exam: Vitals:   05/10/17 1738  BP: 116/67  Pulse: 91  Resp: 18  Temp: 97.6 F (36.4 C)  TempSrc: Oral  SpO2: 93%      Constitutional: NAD, calm, comfortable Eyes: PERTLA, lids and conjunctivae normal ENMT: Mucous membranes are moist. Posterior pharynx clear of any exudate or lesions.   Neck: normal, supple, no masses, no thyromegaly Respiratory: clear to auscultation bilaterally, no wheezing, no crackles. Normal respiratory effort. Cardiovascular: Rate ~80 and irregular. No significant JVD. Abdomen: No distension, no tenderness, no masses palpated. Bowel sounds normal.  Musculoskeletal: large hematoma centered at  medial left knee with ruptured bullae; calf swelling; pedal pulses intact, sensation and motor fxn intact in left foot.  Skin: Aside from LLE findings described above, no significant rashes, lesions, ulcers. Warm, dry, well-perfused. Neurologic: CN 2-12 grossly intact. Sensation intact. Strength 5/5 in all 4 limbs.  Psychiatric: Alert and oriented x 3. Very pleasant and cooperative.     Labs on Admission: I have personally reviewed following labs and imaging studies  CBC:  Recent Labs Lab 05/10/17 1857  WBC 8.8  NEUTROABS 7.2  HGB 9.4*  HCT 30.8*  MCV 93.9  PLT 607*   Basic Metabolic Panel:  Recent Labs Lab 05/10/17 1857  NA 140  K 4.5  CL 105  CO2 27  GLUCOSE 125*  BUN 76*  CREATININE 1.69*  CALCIUM 9.5   GFR: CrCl cannot be calculated (Unknown ideal weight.). Liver Function Tests: No results for input(s): AST, ALT, ALKPHOS, BILITOT, PROT, ALBUMIN in the last 168 hours. No results for input(s): LIPASE, AMYLASE in the last 168 hours. No results for input(s): AMMONIA in the last 168 hours. Coagulation Profile: No results for input(s): INR, PROTIME in the last 168 hours. Cardiac Enzymes: No results for input(s): CKTOTAL, CKMB, CKMBINDEX, TROPONINI in the last 168 hours. BNP (last 3 results) No results for input(s): PROBNP in the last 8760 hours. HbA1C: No results for input(s): HGBA1C in the last 72 hours. CBG: No results for input(s): GLUCAP in the last 168 hours. Lipid Profile: No results for input(s): CHOL, HDL, LDLCALC, TRIG, CHOLHDL, LDLDIRECT in the last 72 hours. Thyroid Function Tests: No results for input(s): TSH, T4TOTAL, FREET4, T3FREE, THYROIDAB in the last 72 hours. Anemia Panel: No results for input(s): VITAMINB12, FOLATE, FERRITIN, TIBC, IRON, RETICCTPCT in the last 72 hours. Urine analysis:    Component Value Date/Time   COLORURINE YELLOW 02/10/2016 1503   APPEARANCEUR CLOUDY (A) 02/10/2016 1503   LABSPEC 1.014 02/10/2016 1503   PHURINE 5.5  02/10/2016 1503   GLUCOSEU NEGATIVE 02/10/2016 1503   HGBUR NEGATIVE 02/10/2016 1503   BILIRUBINUR NEGATIVE 02/10/2016 1503   KETONESUR NEGATIVE 02/10/2016 1503   PROTEINUR NEGATIVE 02/10/2016 1503   NITRITE NEGATIVE 02/10/2016 1503   LEUKOCYTESUR NEGATIVE 02/10/2016 1503   Sepsis Labs: @LABRCNTIP (procalcitonin:4,lacticidven:4) )No results found for this or any previous visit (from the past 240 hour(s)).   Radiological Exams on Admission: Dg Tibia/fibula Left  Result Date: 05/10/2017 CLINICAL DATA:  Left knee pain after fall EXAM:  LEFT KNEE - COMPLETE 4+ VIEW; LEFT TIBIA AND FIBULA - 2 VIEW COMPARISON:  Left knee radiographs 04/10/2008 FINDINGS: Left knee: Osteopenia limits the study. Moderate patellofemoral degenerative changes. No definite acute displaced fracture or malalignment. Chondrocalcinosis. Moderate-to-marked narrowing of the medial joint space compartment with bony spurring. Small moderate suprapatellar effusion. Masslike soft tissue opacities along the medial aspect of the left knee and the left proximal lower leg. Left tibia fibula: Osteopenia. Vascular calcifications. No acute fracture or malalignment. 8.5 cm oval soft tissue mass medial aspect of the proximal lower leg. IMPRESSION: 1. Osteopenia limits the study 2. No definite acute osseous abnormality at the left knee or left tibia fibula 3. Small to moderate suprapatellar effusion 4. Oval soft tissue masses along the medial aspect of the left knee and the medial aspect of the proximal lower leg, could represent large hematoma although other soft tissue mass not excluded, clinical correlation is recommended 5. Chondrocalcinosis with moderate to marked degenerative changes of the left knee Electronically Signed   By: Donavan Foil M.D.   On: 05/10/2017 18:47   Dg Knee Complete 4 Views Left  Result Date: 05/10/2017 CLINICAL DATA:  Left knee pain after fall EXAM: LEFT KNEE - COMPLETE 4+ VIEW; LEFT TIBIA AND FIBULA - 2 VIEW  COMPARISON:  Left knee radiographs 04/10/2008 FINDINGS: Left knee: Osteopenia limits the study. Moderate patellofemoral degenerative changes. No definite acute displaced fracture or malalignment. Chondrocalcinosis. Moderate-to-marked narrowing of the medial joint space compartment with bony spurring. Small moderate suprapatellar effusion. Masslike soft tissue opacities along the medial aspect of the left knee and the left proximal lower leg. Left tibia fibula: Osteopenia. Vascular calcifications. No acute fracture or malalignment. 8.5 cm oval soft tissue mass medial aspect of the proximal lower leg. IMPRESSION: 1. Osteopenia limits the study 2. No definite acute osseous abnormality at the left knee or left tibia fibula 3. Small to moderate suprapatellar effusion 4. Oval soft tissue masses along the medial aspect of the left knee and the medial aspect of the proximal lower leg, could represent large hematoma although other soft tissue mass not excluded, clinical correlation is recommended 5. Chondrocalcinosis with moderate to marked degenerative changes of the left knee Electronically Signed   By: Donavan Foil M.D.   On: 05/10/2017 18:47    EKG: Not performed.   Assessment/Plan  1. Left leg traumatic hematoma  - Pt presents with rapidly progressive hematoma after suffering a mechanical fall and striking the left knee  - Radiographs without any definite osseous abnormality, though limited by osteopenia; soft-tissue hematoma noted at medial left knee  - Pt maintains sensation and motor function in left foot and pedal pulses are palpable  - Surgery has been consulted by ED physician and much appreciated  - Will continue monitoring for acute compartment syndrome, providing supportive care with analgesia    2. Atrial fibrillation  - In rate-controlled a fib on arrival  - CHADS-VASc is 62 (age x2, gender, CAD, CHF)  - Eliquis held on admission in light of the hematoma   3. CAD  - No anginal complaints    - Continue Lipitor   4. Chronic diastolic CHF  - Pt appears roughly euvolemic on admission  - Managed at home with torsemide, Aldactone, and losartan  - Plan to hold the diuretics initially given apparent euvolemia and elevated SCr  - Follow daily wts and I/O's   5. Pulmonary hypertension  - Stable on admission  - Diuretics held on admission in light of worsened renal  fxn  - Continue Letairis as tolerated    6. CKD stage III  - SCr is 1.69 on admission, up from 1.35 in March 2018  - Plan to hold diuretics initially, renally-dose medications as needed, repeat chem panel tomorrow    7. Anemia, thrombocytopenia  - Hgb is 9.4 on admission, down from 10.6 in March '18  - Likely secondary to CKD; slight worsening could be secondary to #1  - Plan to type and screen and repeat H&H overnight     DVT prophylaxis: Eliquis prior to arrival, held on admission d/t clinical scenario Code Status: Full  Family Communication: Discussed with patient Disposition Plan: Observe on medsurg Consults called: Surgery Admission status: Observation    Vianne Bulls, MD [facility] Pager [phone]  If 7PM-7AM, please contact night-coverage [email or web address] Password [facility]  05/10/2017, 8:47 PM

## 2017-05-10 NOTE — ED Notes (Signed)
Patient placed on PureWick female external catheter. 

## 2017-05-10 NOTE — ED Notes (Signed)
Incontinence care provided.  Pure wick adjusted.  Ready for transport to the floor.

## 2017-05-10 NOTE — ED Notes (Signed)
Contacted pharmacy to verify medications 

## 2017-05-10 NOTE — ED Triage Notes (Signed)
Pt states she bumped her left knee on the wall. Significant swelling, deformity with blue discoloration, wheeping blisters present to LLE. Pt is on eliquis. Limited ROM.

## 2017-05-10 NOTE — ED Notes (Signed)
MD at bedside evaluating leg

## 2017-05-10 NOTE — ED Provider Notes (Signed)
[facility] DEPT Provider Note   CSN: [id number] Arrival date & time: 05/10/17  1732     History   Chief Complaint Chief Complaint  Patient presents with  . Leg Swelling    HPI [name] is a 82 y.o. female.  HPI 82 year old female who presents with left leg pain and swelling. History of CAD, chronic atrial fibrillation on eliquis, obesity, and pulmonary hypertension. Reports that around 3 PM today, she tripped while walking out of her bedroom with a cane. Her left leg got caught underneath her. Denies headstrike or LOC. No syncope or near syncope. Since then she has had gradually worsening swelling, bruising and pain to the left knee. Denies headache, numbness, weakness, chest pain, dyspnea, or abdominal pain. Took a dose of hydrocodone, with some improvement in pain.   Past Medical History:  Diagnosis Date  . A-fib ([place])   . Arthritis    "knees" (11/17/2015)  . CAD (coronary artery disease)    BMS to LAD 07/2005  . Chronic atrial fibrillation ([place])    Since 2011  . Degenerative arthritis of right knee   . Essential hypertension   . GERD (gastroesophageal reflux disease)   . Hyperlipidemia   . Morbid obesity ([place])   . Myocardial infarction ([place]) 07/2005  . On home oxygen therapy    "2.5L at night" (11/17/2015)  . Psoriasis   . Pulmonary hypertension (HCC)    Mixed, PASP 67 mmHg at [facility] 2013  . Right ventricular dysfunction   . Tricuspid regurgitation    Severe    Patient Active Problem List   Diagnosis Date Noted  . CKD (chronic kidney disease), stage III 05/10/2017  . Thigh hematoma, left, initial encounter 05/10/2017  . Hematoma of left thigh 05/10/2017  . Chronic respiratory failure ([place]) 07/22/2016  . Chronic rhinitis 02/08/2016  . Chronic diastolic heart failure ([place]) 12/28/2015  . PAH (pulmonary artery hypertension) ([place])   . CHF (congestive heart failure) ([place]) 11/17/2015  . Acute congestive heart failure ([place]) 11/17/2015  . Pulmonary  nodules 10/07/2013  . Encounter for therapeutic drug monitoring 09/22/2013  . DOE (dyspnea on exertion) 05/26/2012  . Psoriasis   . Carotid bruit   . Warfarin anticoagulation   . CAD (coronary artery disease)   . Pulmonary hypertension ([place]) 04/19/2011  . Atrial fibrillation ([place]) 11/16/2010  . GERD 04/17/2010  . COLONIC POLYPS, HX OF 04/17/2010  . Hyperlipidemia 04/17/2009  . Overweight(278.02) 04/17/2009  . Essential hypertension 04/17/2009    Past Surgical History:  Procedure Laterality Date  . ACHILLES TENDON SURGERY Right 1970s  . CARDIAC CATHETERIZATION N/A 12/07/2015   Procedure: Right Heart Cath;  Surgeon: Jolaine Artist, MD;  Location: [facility];  Service: Cardiovascular;  Laterality: N/A;  . CARPAL TUNNEL RELEASE  11/29/2011   Procedure: CARPAL TUNNEL RELEASE;  Surgeon: Cammie Sickle., MD;  Location: [facility];  Service: Orthopedics;  Laterality: Right;  . COLONOSCOPY    . CORONARY ANGIOPLASTY WITH STENT PLACEMENT  07/2005   Dr. Olevia Perches  . EYE SURGERY    . LAPAROSCOPIC CHOLECYSTECTOMY    . RETINAL DETACHMENT SURGERY Right     OB History    No data available       Home Medications    Prior to Admission medications   Medication Sig Start Date End Date Taking? Authorizing Provider  ALPRAZolam Duanne Moron) 0.5 MG tablet Take 0.5 mg by mouth at bedtime.    Yes [provider]  atorvastatin (  LIPITOR) 80 MG tablet TAKE 1/2 TABLET BY MOUTH DAILY Patient taking differently: TAKE 1/2 TABLET (40 MG) BY MOUTH DAILY AT BEDTIME 08/07/15  Yes Satira Sark, MD  losartan (COZAAR) 100 MG tablet Take 100 mg by mouth at bedtime.    Yes [provider]  albuterol (PROVENTIL) 2 MG tablet Take 1 tablet by mouth 3 (three) times daily. 10/01/16   [provider]  alum hydroxide-mag trisilicate (GAVISCON) 16-01 MG CHEW chewable tablet Chew 1 tablet by mouth 3 (three) times daily as needed for indigestion or heartburn.     [provider]  ambrisentan (LETAIRIS) 5 MG tablet Take 1 tablet (5 mg total) by mouth daily. 02/18/17   Collene Gobble, MD  AZOPT 1 % ophthalmic suspension Place 1 drop into both eyes 3 (three) times daily.  05/02/12   [provider]  benzonatate (TESSALON) 200 MG capsule Take 1 capsule (200 mg total) by mouth 3 (three) times daily as needed for cough. 10/11/16 10/11/17  Parrett, Fonnie Mu, NP  brimonidine (ALPHAGAN) 0.2 % ophthalmic solution Place 1 drop into both eyes 2 (two) times daily.  04/21/15   [provider]  Coenzyme Q10 (COQ10) 200 MG CAPS Take 200 mg by mouth daily.    [provider]  colchicine 0.6 MG tablet Take 0.6 mg by mouth daily.    [provider]  ELIQUIS 5 MG TABS tablet TAKE 1 TABLET BY MOUTH TWICE DAILY 01/15/17   Bensimhon, Shaune Pascal, MD  fluticasone (FLONASE) 50 MCG/ACT nasal spray Place 2 sprays into both nostrils daily.  05/29/15   [provider]  HYDROcodone-acetaminophen (NORCO/VICODIN) 5-325 MG tablet Take 1 tablet by mouth 2 (two) times daily as needed for moderate pain.  03/26/15   [provider]  latanoprost (XALATAN) 0.005 % ophthalmic solution Place 1 drop into the right eye at bedtime.  03/30/12   [provider]  lidocaine (XYLOCAINE) 5 % ointment Apply 1 application topically daily as needed (knee pain).  04/10/15   [provider]  nitroGLYCERIN (NITROSTAT) 0.4 MG SL tablet Place 0.4 mg under the tongue every 5 (five) minutes as needed for chest pain (x 3 tablets daily). Reported on 12/28/2015    [provider]  omeprazole (PRILOSEC) 40 MG capsule Take 40 mg by mouth daily.    [provider]  ondansetron (ZOFRAN ODT) 8 MG disintegrating tablet Take 1 tablet (8 mg total) by mouth every 8 (eight) hours as needed for nausea or vomiting. 02/10/16   Lajean Saver, MD  pantoprazole (PROTONIX) 20 MG tablet Take 20 mg by mouth daily. Reported on 02/10/2016    [provider]  potassium chloride SA (K-DUR,KLOR-CON) 20 MEQ tablet TAKE 1 TABLET BY MOUTH EVERY DAY 12/10/16   Bensimhon, Shaune Pascal, MD  Probiotic Product (PROBIOTIC PO) Take 1 tablet by mouth 2 (two) times daily.    [provider]  spironolactone (ALDACTONE) 25 MG tablet TAKE 1/2 TABLET BY MOUTH EVERY DAY 10/23/16   Bensimhon, Shaune Pascal, MD  timolol (TIMOPTIC) 0.5 % ophthalmic solution Place 1 drop into both eyes 2 (two) times daily. 11/20/15   [provider]  torsemide (DEMADEX) 20 MG tablet TAKE 2 TABLETS BY MOUTH EVERY DAY 05/20/16   Bensimhon, Shaune Pascal, MD    Family History Family History  Problem Relation Age of Onset  . Heart attack Mother   . Coronary artery disease Father   . Stroke Father   . Heart attack Brother   .  Heart failure Brother   . COPD Brother   . Emphysema Sister   . Emphysema Brother     Social History Social History  Substance Use Topics  . Smoking status: Former Smoker    Packs/day: 0.30    Years: 0.50    Types: Cigarettes    Quit date: 08/19/1986  . Smokeless tobacco: Never Used     Comment: pt only smoked for only a few months.  . Alcohol use No     Allergies   Patient has no known allergies.   Review of Systems Review of Systems  Constitutional: Negative for fever.  Musculoskeletal:       Left knee pain   Skin: Positive for wound.  Neurological: Negative for weakness and numbness.  Hematological: Bruises/bleeds easily.  All other systems reviewed and are negative.    Physical Exam Updated Vital Signs BP 116/67 (BP Location: Right Arm)   Pulse 91   Temp 97.6 F (36.4 C) (Oral)   Resp 18   SpO2 93%   Physical Exam Physical Exam  Nursing note and vitals reviewed. Constitutional: Well developed, well nourished, non-toxic, and in no acute distress Head: Normocephalic and atraumatic.  Mouth/Throat: Oropharynx is clear and moist.  Neck: Normal range of motion. Neck supple.  Cardiovascular: Normal rate and regular rhythm.     Pulmonary/Chest: Effort normal and breath sounds normal. +2 DP pulses bilaterally Abdominal: Soft. There is no tenderness. There is no rebound and no guarding.  Musculoskeletal: significant soft tissue swelling/bruising to the left knee, extending to the distal inner thigh and proximal tib/fib area. Limited ROM due to swelling.  Neurological: Alert, no facial droop, fluent speech, sensation to light touch in tact in bilateral lower extremities, sensation to light touch in tact in bilateral lower extremities Skin: Skin is warm and dry.  Psychiatric: Cooperative   ED Treatments / Results  Labs (all labs ordered are listed, but only abnormal results are displayed) Labs Reviewed  CBC WITH DIFFERENTIAL/PLATELET - Abnormal; Notable for the following:       Result Value   RBC 3.28 (*)    Hemoglobin 9.4 (*)    HCT 30.8 (*)    Platelets 129 (*)    All other components within normal limits  BASIC METABOLIC PANEL - Abnormal; Notable for the following:    Glucose, Bld 125 (*)    BUN 76 (*)    Creatinine, Ser 1.69 (*)    GFR calc non Af Amer 27 (*)    GFR calc Af Amer 32 (*)    All other components within normal limits  BASIC METABOLIC PANEL  CBC  HEMOGLOBIN  HEMATOCRIT  TYPE AND SCREEN    EKG  EKG Interpretation None       Radiology Dg Tibia/fibula Left  Result Date: 05/10/2017 CLINICAL DATA:  Left knee pain after fall EXAM: LEFT KNEE - COMPLETE 4+ VIEW; LEFT TIBIA AND FIBULA - 2 VIEW COMPARISON:  Left knee radiographs 04/10/2008 FINDINGS: Left knee: Osteopenia limits the study. Moderate patellofemoral degenerative changes. No definite acute displaced fracture or malalignment. Chondrocalcinosis. Moderate-to-marked narrowing of the medial joint space compartment with bony spurring. Small moderate suprapatellar effusion. Masslike soft tissue opacities along the medial aspect of the left knee and the left proximal lower leg. Left tibia fibula: Osteopenia. Vascular calcifications. No  acute fracture or malalignment. 8.5 cm oval soft tissue mass medial aspect of the proximal lower leg. IMPRESSION: 1. Osteopenia limits the study 2. No definite acute osseous abnormality at the left  knee or left tibia fibula 3. Small to moderate suprapatellar effusion 4. Oval soft tissue masses along the medial aspect of the left knee and the medial aspect of the proximal lower leg, could represent large hematoma although other soft tissue mass not excluded, clinical correlation is recommended 5. Chondrocalcinosis with moderate to marked degenerative changes of the left knee Electronically Signed   By: Donavan Foil M.D.   On: 05/10/2017 18:47   Dg Knee Complete 4 Views Left  Result Date: 05/10/2017 CLINICAL DATA:  Left knee pain after fall EXAM: LEFT KNEE - COMPLETE 4+ VIEW; LEFT TIBIA AND FIBULA - 2 VIEW COMPARISON:  Left knee radiographs 04/10/2008 FINDINGS: Left knee: Osteopenia limits the study. Moderate patellofemoral degenerative changes. No definite acute displaced fracture or malalignment. Chondrocalcinosis. Moderate-to-marked narrowing of the medial joint space compartment with bony spurring. Small moderate suprapatellar effusion. Masslike soft tissue opacities along the medial aspect of the left knee and the left proximal lower leg. Left tibia fibula: Osteopenia. Vascular calcifications. No acute fracture or malalignment. 8.5 cm oval soft tissue mass medial aspect of the proximal lower leg. IMPRESSION: 1. Osteopenia limits the study 2. No definite acute osseous abnormality at the left knee or left tibia fibula 3. Small to moderate suprapatellar effusion 4. Oval soft tissue masses along the medial aspect of the left knee and the medial aspect of the proximal lower leg, could represent large hematoma although other soft tissue mass not excluded, clinical correlation is recommended 5. Chondrocalcinosis with moderate to marked degenerative changes of the left knee Electronically Signed   By: Donavan Foil  M.D.   On: 05/10/2017 18:47    Procedures Procedures (including critical care time)  Medications Ordered in ED Medications  ambrisentan (LETAIRIS) tablet 5 mg (not administered)  benzonatate (TESSALON) capsule 200 mg (not administered)  albuterol (PROVENTIL) tablet 2 mg (not administered)  pantoprazole (PROTONIX) EC tablet 40 mg (not administered)  alum hydroxide-mag trisilicate (GAVISCON) 40-34 MG per chewable tablet 1 tablet (not administered)  losartan (COZAAR) tablet 100 mg (not administered)  timolol (TIMOPTIC) 0.5 % ophthalmic solution 1 drop (not administered)  atorvastatin (LIPITOR) tablet 40 mg (not administered)  brimonidine (ALPHAGAN) 0.2 % ophthalmic solution 1 drop (not administered)  fluticasone (FLONASE) 50 MCG/ACT nasal spray 2 spray (not administered)  HYDROcodone-acetaminophen (NORCO/VICODIN) 5-325 MG per tablet 1 tablet (not administered)  lidocaine (XYLOCAINE) 5 % ointment 1 application (not administered)  brinzolamide (AZOPT) 1 % ophthalmic suspension 1 drop (not administered)  latanoprost (XALATAN) 0.005 % ophthalmic solution 1 drop (not administered)  ALPRAZolam (XANAX) tablet 0.5 mg (not administered)  sodium chloride flush (NS) 0.9 % injection 3 mL (not administered)  sodium chloride flush (NS) 0.9 % injection 3 mL (not administered)  0.9 %  sodium chloride infusion (not administered)  acetaminophen (TYLENOL) tablet 650 mg (not administered)    Or  acetaminophen (TYLENOL) suppository 650 mg (not administered)  senna-docusate (Senokot-S) tablet 1 tablet (not administered)  bisacodyl (DULCOLAX) EC tablet 5 mg (not administered)  ondansetron (ZOFRAN) tablet 4 mg (not administered)    Or  ondansetron (ZOFRAN) injection 4 mg (not administered)  morphine 4 MG/ML injection 2 mg (not administered)  morphine 4 MG/ML injection 4 mg (4 mg Intravenous Given 05/10/17 2048)     Initial Impression / Assessment and Plan / ED Course  I have reviewed the triage vital  signs and the nursing notes.  Pertinent labs & imaging results that were available during my care of the patient were reviewed by me and  considered in my medical decision making (see chart for details).     Presents with enlarging hematoma of the left lower extremity after mechanical fall, likely related to her anticoagulation on Eliquis. No head injury or other acute injury. Extremity is neurovascularly intact. Compartments at this time are soft, and she has no escalating pain requirement. At this time there is no concerns for acute compartment syndrome, but given the rapidly enlarging nature of this hematoma on Eliquis, I am concerned about potential development of this. Compression dressing applied, will apply ice, and elevate while lying in bed. Plan to admit her for compartment checks.  Visualized x-ray x-ray does not show evidence of fracture. Did briefly discuss with Dr. Ninfa Linden who will consult in the hospital. At this time continue supportive care including compression dressing, ice, like elevation. If she were to have escalating pain requirement of the leg or concerns for compartment syndrome, he requests emergent reconsult.  Final Clinical Impressions(s) / ED Diagnoses   Final diagnoses:  Leg hematoma, left, initial encounter    New Prescriptions New Prescriptions   No medications on file     Forde Dandy, MD 05/10/17 2113

## 2017-05-11 DIAGNOSIS — S7012XA Contusion of left thigh, initial encounter: Secondary | ICD-10-CM | POA: Diagnosis not present

## 2017-05-11 DIAGNOSIS — Z7901 Long term (current) use of anticoagulants: Secondary | ICD-10-CM | POA: Diagnosis not present

## 2017-05-11 DIAGNOSIS — I252 Old myocardial infarction: Secondary | ICD-10-CM | POA: Diagnosis not present

## 2017-05-11 DIAGNOSIS — Z823 Family history of stroke: Secondary | ICD-10-CM | POA: Diagnosis not present

## 2017-05-11 DIAGNOSIS — D62 Acute posthemorrhagic anemia: Secondary | ICD-10-CM | POA: Diagnosis not present

## 2017-05-11 DIAGNOSIS — J9690 Respiratory failure, unspecified, unspecified whether with hypoxia or hypercapnia: Secondary | ICD-10-CM | POA: Diagnosis not present

## 2017-05-11 DIAGNOSIS — Z9981 Dependence on supplemental oxygen: Secondary | ICD-10-CM | POA: Diagnosis not present

## 2017-05-11 DIAGNOSIS — Z955 Presence of coronary angioplasty implant and graft: Secondary | ICD-10-CM | POA: Diagnosis not present

## 2017-05-11 DIAGNOSIS — I621 Nontraumatic extradural hemorrhage: Secondary | ICD-10-CM | POA: Diagnosis not present

## 2017-05-11 DIAGNOSIS — E785 Hyperlipidemia, unspecified: Secondary | ICD-10-CM | POA: Diagnosis present

## 2017-05-11 DIAGNOSIS — N183 Chronic kidney disease, stage 3 (moderate): Secondary | ICD-10-CM | POA: Diagnosis not present

## 2017-05-11 DIAGNOSIS — I130 Hypertensive heart and chronic kidney disease with heart failure and stage 1 through stage 4 chronic kidney disease, or unspecified chronic kidney disease: Secondary | ICD-10-CM | POA: Diagnosis present

## 2017-05-11 DIAGNOSIS — W010XXA Fall on same level from slipping, tripping and stumbling without subsequent striking against object, initial encounter: Secondary | ICD-10-CM | POA: Diagnosis present

## 2017-05-11 DIAGNOSIS — I5032 Chronic diastolic (congestive) heart failure: Secondary | ICD-10-CM | POA: Diagnosis not present

## 2017-05-11 DIAGNOSIS — Z87891 Personal history of nicotine dependence: Secondary | ICD-10-CM | POA: Diagnosis not present

## 2017-05-11 DIAGNOSIS — D6959 Other secondary thrombocytopenia: Secondary | ICD-10-CM | POA: Diagnosis present

## 2017-05-11 DIAGNOSIS — I4891 Unspecified atrial fibrillation: Secondary | ICD-10-CM | POA: Diagnosis not present

## 2017-05-11 DIAGNOSIS — I10 Essential (primary) hypertension: Secondary | ICD-10-CM | POA: Diagnosis not present

## 2017-05-11 DIAGNOSIS — J9610 Chronic respiratory failure, unspecified whether with hypoxia or hypercapnia: Secondary | ICD-10-CM | POA: Diagnosis present

## 2017-05-11 DIAGNOSIS — Y92091 Bathroom in other non-institutional residence as the place of occurrence of the external cause: Secondary | ICD-10-CM | POA: Diagnosis not present

## 2017-05-11 DIAGNOSIS — Z8249 Family history of ischemic heart disease and other diseases of the circulatory system: Secondary | ICD-10-CM | POA: Diagnosis not present

## 2017-05-11 DIAGNOSIS — I2721 Secondary pulmonary arterial hypertension: Secondary | ICD-10-CM | POA: Diagnosis not present

## 2017-05-11 DIAGNOSIS — R571 Hypovolemic shock: Secondary | ICD-10-CM | POA: Diagnosis present

## 2017-05-11 DIAGNOSIS — S8012XA Contusion of left lower leg, initial encounter: Secondary | ICD-10-CM | POA: Diagnosis not present

## 2017-05-11 DIAGNOSIS — I481 Persistent atrial fibrillation: Secondary | ICD-10-CM | POA: Diagnosis present

## 2017-05-11 DIAGNOSIS — I270 Primary pulmonary hypertension: Secondary | ICD-10-CM | POA: Diagnosis not present

## 2017-05-11 DIAGNOSIS — K219 Gastro-esophageal reflux disease without esophagitis: Secondary | ICD-10-CM | POA: Diagnosis present

## 2017-05-11 DIAGNOSIS — S8990XA Unspecified injury of unspecified lower leg, initial encounter: Secondary | ICD-10-CM | POA: Diagnosis not present

## 2017-05-11 DIAGNOSIS — I9589 Other hypotension: Secondary | ICD-10-CM | POA: Diagnosis not present

## 2017-05-11 DIAGNOSIS — M7989 Other specified soft tissue disorders: Secondary | ICD-10-CM | POA: Diagnosis not present

## 2017-05-11 DIAGNOSIS — I2510 Atherosclerotic heart disease of native coronary artery without angina pectoris: Secondary | ICD-10-CM | POA: Diagnosis not present

## 2017-05-11 DIAGNOSIS — I482 Chronic atrial fibrillation: Secondary | ICD-10-CM | POA: Diagnosis not present

## 2017-05-11 DIAGNOSIS — S8012XD Contusion of left lower leg, subsequent encounter: Secondary | ICD-10-CM | POA: Diagnosis not present

## 2017-05-11 DIAGNOSIS — S7012XD Contusion of left thigh, subsequent encounter: Secondary | ICD-10-CM | POA: Diagnosis not present

## 2017-05-11 LAB — TROPONIN I
TROPONIN I: 0.05 ng/mL — AB (ref <0.03)
TROPONIN I: 0.07 ng/mL — AB (ref <0.03)

## 2017-05-11 LAB — BASIC METABOLIC PANEL
Anion gap: 10 (ref 5–15)
BUN: 72 mg/dL — ABNORMAL HIGH (ref 6–20)
CO2: 24 mmol/L (ref 22–32)
Calcium: 9.1 mg/dL (ref 8.9–10.3)
Chloride: 104 mmol/L (ref 101–111)
Creatinine, Ser: 1.59 mg/dL — ABNORMAL HIGH (ref 0.44–1.00)
GFR calc Af Amer: 34 mL/min — ABNORMAL LOW (ref >60)
GFR calc non Af Amer: 30 mL/min — ABNORMAL LOW (ref >60)
Glucose, Bld: 98 mg/dL (ref 65–99)
Potassium: 4.7 mmol/L (ref 3.5–5.1)
Sodium: 138 mmol/L (ref 135–145)

## 2017-05-11 LAB — CBC
HCT: 25.9 % — ABNORMAL LOW (ref 36.0–46.0)
HCT: 25.6 % — ABNORMAL LOW (ref 36.0–46.0)
HEMOGLOBIN: 7.9 g/dL — AB (ref 12.0–15.0)
Hemoglobin: 8 g/dL — ABNORMAL LOW (ref 12.0–15.0)
MCH: 29 pg (ref 26.0–34.0)
MCH: 29 pg (ref 26.0–34.0)
MCHC: 30.9 g/dL (ref 30.0–36.0)
MCHC: 30.9 g/dL (ref 30.0–36.0)
MCV: 93.8 fL (ref 78.0–100.0)
MCV: 94.1 fL (ref 78.0–100.0)
PLATELETS: 110 10*3/uL — AB (ref 150–400)
Platelets: 109 10*3/uL — ABNORMAL LOW (ref 150–400)
RBC: 2.76 MIL/uL — ABNORMAL LOW (ref 3.87–5.11)
RBC: 2.72 MIL/uL — ABNORMAL LOW (ref 3.87–5.11)
RDW: 14.8 % (ref 11.5–15.5)
RDW: 14.4 % (ref 11.5–15.5)
WBC: 5.8 10*3/uL (ref 4.0–10.5)
WBC: 6.2 10*3/uL (ref 4.0–10.5)

## 2017-05-11 LAB — LACTIC ACID, PLASMA: LACTIC ACID, VENOUS: 1.8 mmol/L (ref 0.5–1.9)

## 2017-05-11 LAB — HEMOGLOBIN AND HEMATOCRIT, BLOOD
HCT: 27.5 % — ABNORMAL LOW (ref 36.0–46.0)
HEMOGLOBIN: 8.8 g/dL — AB (ref 12.0–15.0)

## 2017-05-11 LAB — PREPARE RBC (CROSSMATCH)

## 2017-05-11 LAB — ABO/RH: ABO/RH(D): B NEG

## 2017-05-11 LAB — HEMOGLOBIN: HEMOGLOBIN: 8.9 g/dL — AB (ref 12.0–15.0)

## 2017-05-11 LAB — HEMATOCRIT: HEMATOCRIT: 28.3 % — AB (ref 36.0–46.0)

## 2017-05-11 MED ORDER — SODIUM CHLORIDE 0.9 % IV BOLUS (SEPSIS)
1000.0000 mL | Freq: Once | INTRAVENOUS | Status: AC
  Administered 2017-05-11: 1000 mL via INTRAVENOUS

## 2017-05-11 MED ORDER — SODIUM CHLORIDE 0.9 % IV BOLUS (SEPSIS)
500.0000 mL | Freq: Once | INTRAVENOUS | Status: AC
  Administered 2017-05-11: 500 mL via INTRAVENOUS

## 2017-05-11 MED ORDER — SODIUM CHLORIDE 0.9 % IV SOLN
INTRAVENOUS | Status: DC
  Administered 2017-05-11: 18:00:00 via INTRAVENOUS
  Administered 2017-05-12: 100 mL/h via INTRAVENOUS
  Administered 2017-05-12 – 2017-05-13 (×3): via INTRAVENOUS

## 2017-05-11 MED ORDER — SODIUM CHLORIDE 0.9 % IV SOLN
Freq: Once | INTRAVENOUS | Status: AC
  Administered 2017-05-11: 15:00:00 via INTRAVENOUS

## 2017-05-11 MED ORDER — SODIUM CHLORIDE 0.9 % IV SOLN
Freq: Once | INTRAVENOUS | Status: AC
  Administered 2017-05-11: 14:00:00 via INTRAVENOUS

## 2017-05-11 MED ORDER — SODIUM CHLORIDE 0.9 % IV BOLUS (SEPSIS)
250.0000 mL | Freq: Once | INTRAVENOUS | Status: AC
  Administered 2017-05-11: 250 mL via INTRAVENOUS

## 2017-05-11 MED ORDER — BRINZOLAMIDE 1 % OP SUSP
1.0000 [drp] | Freq: Two times a day (BID) | OPHTHALMIC | Status: DC
  Administered 2017-05-11 – 2017-05-16 (×10): 1 [drp] via OPHTHALMIC
  Filled 2017-05-11 (×2): qty 10

## 2017-05-11 MED ORDER — PROTHROMBIN COMPLEX CONC HUMAN 500 UNITS IV KIT
4525.0000 [IU] | PACK | Status: AC
  Administered 2017-05-11: 4525 [IU] via INTRAVENOUS
  Filled 2017-05-11: qty 181

## 2017-05-11 MED ORDER — PROTHROMBIN COMPLEX CONC HUMAN 500 UNITS IV KIT
4560.0000 [IU] | PACK | INTRAVENOUS | Status: DC
  Filled 2017-05-11: qty 182

## 2017-05-11 MED ORDER — SODIUM CHLORIDE 0.9 % IV BOLUS (SEPSIS): 1000.0000 mL | INTRAVENOUS | Status: DC | PRN

## 2017-05-11 MED ORDER — AMBRISENTAN 5 MG PO TABS
5.0000 mg | ORAL_TABLET | Freq: Every evening | ORAL | Status: DC
  Administered 2017-05-11: 5 mg via ORAL
  Filled 2017-05-11: qty 1

## 2017-05-11 NOTE — Consult Note (Signed)
Reason for Consult:  Left lower extremity expanding hematoma Referring Physician: Dr. Brantley Stage  [name] is an 82 y.o. female.  HPI: The patient is a very pleasant 82 year old who sustained a low-energy trauma to her left lower extremity today. She is on a blood thinning medication due to atrial fibrillation. She noted bruising in her thigh and by her knee and this started to expand over time. This happened about 3:00 this afternoon. It is now 9 hours later. She still denies any numbness in her leg or any significant discomfort or severe pain. She does report some pain when trying to bend her knee but otherwise denies severe pain. Orthopedic surgery is consult at to assess the hematoma closely and to follow it along while she is being admitted.  Past Medical History:  Diagnosis Date  . A-fib ([place])   . Arthritis    "knees" (11/17/2015)  . CAD (coronary artery disease)    BMS to LAD 07/2005  . Chronic atrial fibrillation ([place])    Since 2011  . Degenerative arthritis of right knee   . Essential hypertension   . GERD (gastroesophageal reflux disease)   . Hyperlipidemia   . Morbid obesity ([place])   . Myocardial infarction ([place]) 07/2005  . On home oxygen therapy    "2.5L at night" (11/17/2015)  . Psoriasis   . Pulmonary hypertension (HCC)    Mixed, PASP 67 mmHg at [facility] 2013  . Right ventricular dysfunction   . Tricuspid regurgitation    Severe    Past Surgical History:  Procedure Laterality Date  . ACHILLES TENDON SURGERY Right 1970s  . CARDIAC CATHETERIZATION N/A 12/07/2015   Procedure: Right Heart Cath;  Surgeon: Jolaine Artist, MD;  Location: [facility];  Service: Cardiovascular;  Laterality: N/A;  . CARPAL TUNNEL RELEASE  11/29/2011   Procedure: CARPAL TUNNEL RELEASE;  Surgeon: Cammie Sickle., MD;  Location: [facility];  Service: Orthopedics;  Laterality: Right;  . COLONOSCOPY    . CORONARY ANGIOPLASTY WITH STENT PLACEMENT  07/2005   Dr.  Olevia Perches  . EYE SURGERY    . LAPAROSCOPIC CHOLECYSTECTOMY    . RETINAL DETACHMENT SURGERY Right     Family History  Problem Relation Age of Onset  . Heart attack Mother   . Coronary artery disease Father   . Stroke Father   . Heart attack Brother   . Heart failure Brother   . COPD Brother   . Emphysema Sister   . Emphysema Brother     Social History:  reports that she quit smoking about 30 years ago. Her smoking use included Cigarettes. She has a 0.15 pack-year smoking history. She has never used smokeless tobacco. She reports that she does not drink alcohol or use drugs.  Allergies: No Known Allergies  Medications: I have reviewed the patient's current medications.  Results for orders placed or performed during the hospital encounter of 05/10/17 (from the past 48 hour(s))  CBC with Differential     Status: Abnormal   Collection Time: 05/10/17  6:57 PM  Result Value Ref Range   WBC 8.8 4.0 - 10.5 K/uL   RBC 3.28 (L) 3.87 - 5.11 MIL/uL   Hemoglobin 9.4 (L) 12.0 - 15.0 g/dL   HCT 30.8 (L) 36.0 - 46.0 %   MCV 93.9 78.0 - 100.0 fL   MCH 28.7 26.0 - 34.0 pg   MCHC 30.5 30.0 - 36.0 g/dL   RDW 14.1 11.5 - 15.5 %  Platelets 129 (L) 150 - 400 K/uL   Neutrophils Relative % 82 %   Neutro Abs 7.2 1.7 - 7.7 K/uL   Lymphocytes Relative 14 %   Lymphs Abs 1.2 0.7 - 4.0 K/uL   Monocytes Relative 3 %   Monocytes Absolute 0.3 0.1 - 1.0 K/uL   Eosinophils Relative 1 %   Eosinophils Absolute 0.1 0.0 - 0.7 K/uL   Basophils Relative 0 %   Basophils Absolute 0.0 0.0 - 0.1 K/uL  Basic metabolic panel     Status: Abnormal   Collection Time: 05/10/17  6:57 PM  Result Value Ref Range   Sodium 140 135 - 145 mmol/L   Potassium 4.5 3.5 - 5.1 mmol/L   Chloride 105 101 - 111 mmol/L   CO2 27 22 - 32 mmol/L   Glucose, Bld 125 (H) 65 - 99 mg/dL   BUN 76 (H) 6 - 20 mg/dL   Creatinine, Ser 1.69 (H) 0.44 - 1.00 mg/dL   Calcium 9.5 8.9 - 10.3 mg/dL   GFR calc non Af Amer 27 (L) >60 mL/min   GFR  calc Af Amer 32 (L) >60 mL/min    Comment: (NOTE) The eGFR has been calculated using the CKD EPI equation. This calculation has not been validated in all clinical situations. eGFR's persistently <60 mL/min signify possible Chronic Kidney Disease.    Anion gap 8 5 - 15  Type and screen [place]     Status: None   Collection Time: 05/10/17 10:00 PM  Result Value Ref Range   ABO/RH(D) B NEG    Antibody Screen NEG    Sample Expiration 05/13/2017   ABO/Rh     Status: None (Preliminary result)   Collection Time: 05/10/17 10:00 PM  Result Value Ref Range   ABO/RH(D) B NEG     Dg Tibia/fibula Left  Result Date: 05/10/2017 CLINICAL DATA:  Left knee pain after fall EXAM: LEFT KNEE - COMPLETE 4+ VIEW; LEFT TIBIA AND FIBULA - 2 VIEW COMPARISON:  Left knee radiographs 04/10/2008 FINDINGS: Left knee: Osteopenia limits the study. Moderate patellofemoral degenerative changes. No definite acute displaced fracture or malalignment. Chondrocalcinosis. Moderate-to-marked narrowing of the medial joint space compartment with bony spurring. Small moderate suprapatellar effusion. Masslike soft tissue opacities along the medial aspect of the left knee and the left proximal lower leg. Left tibia fibula: Osteopenia. Vascular calcifications. No acute fracture or malalignment. 8.5 cm oval soft tissue mass medial aspect of the proximal lower leg. IMPRESSION: 1. Osteopenia limits the study 2. No definite acute osseous abnormality at the left knee or left tibia fibula 3. Small to moderate suprapatellar effusion 4. Oval soft tissue masses along the medial aspect of the left knee and the medial aspect of the proximal lower leg, could represent large hematoma although other soft tissue mass not excluded, clinical correlation is recommended 5. Chondrocalcinosis with moderate to marked degenerative changes of the left knee Electronically Signed   By: Donavan Foil M.D.   On: 05/10/2017 18:47   Dg Knee Complete  4 Views Left  Result Date: 05/10/2017 CLINICAL DATA:  Left knee pain after fall EXAM: LEFT KNEE - COMPLETE 4+ VIEW; LEFT TIBIA AND FIBULA - 2 VIEW COMPARISON:  Left knee radiographs 04/10/2008 FINDINGS: Left knee: Osteopenia limits the study. Moderate patellofemoral degenerative changes. No definite acute displaced fracture or malalignment. Chondrocalcinosis. Moderate-to-marked narrowing of the medial joint space compartment with bony spurring. Small moderate suprapatellar effusion. Masslike soft tissue opacities along the medial aspect of the  left knee and the left proximal lower leg. Left tibia fibula: Osteopenia. Vascular calcifications. No acute fracture or malalignment. 8.5 cm oval soft tissue mass medial aspect of the proximal lower leg. IMPRESSION: 1. Osteopenia limits the study 2. No definite acute osseous abnormality at the left knee or left tibia fibula 3. Small to moderate suprapatellar effusion 4. Oval soft tissue masses along the medial aspect of the left knee and the medial aspect of the proximal lower leg, could represent large hematoma although other soft tissue mass not excluded, clinical correlation is recommended 5. Chondrocalcinosis with moderate to marked degenerative changes of the left knee Electronically Signed   By: Donavan Foil M.D.   On: 05/10/2017 18:47    Review of Systems  All other systems reviewed and are negative.  Blood pressure 125/63, pulse 86, temperature 98.7 F (37.1 C), temperature source Oral, resp. rate 18, SpO2 94 %. Physical Exam  Constitutional: She is oriented to person, place, and time. She appears well-developed and well-nourished.  HENT:  Head: Normocephalic and atraumatic.  Eyes: Pupils are equal, round, and reactive to light.  Neck: Normal range of motion. Neck supple.  Cardiovascular: Normal rate.   Respiratory: Effort normal and breath sounds normal.  GI: Soft. Bowel sounds are normal.  Musculoskeletal:       Legs: Neurological: She is alert  and oriented to person, place, and time.  Skin: Skin is warm.  Psychiatric: She has a normal mood and affect.   There is significant bruising at her left thigh and left medial leg. There is blistering as well. The compartments are soft. She moves her foot and toes easily. She reports normal sensation over her foot. Her foot is well perfused on the left side. She tolerates me easily moving her ankle around. She does have some pain and discomfort with me flexing her knee.   Assessment/Plan: Left lower extremity thigh, knee, leg hematoma  1) for now we are recommending compression, elevation and ice. I spoke to her and her husband in length at the bedside about what the treatment can be for hematomas and how this will likely be there for a very long period time until the body absorbs this. Right now I do not see any evidence of compartment syndrome nor need to urgently take her to the operating room. I'll reevaluate her over the next several hours to see if anything changes into likely unroofed blisters. She understands this fully and all questions were encouraged and answered.  Mcarthur Rossetti 05/11/2017, 12:15 AM

## 2017-05-11 NOTE — Significant Event (Signed)
Rapid Response Event Note  Overview: Time Called: 0626 Arrival Time: 1500 Event Type: Hypotension  Initial Focused Assessment:  Called to bedside for a pt with systolic BP in the 94W.  2nd unit of PRBCs infusing on my arrival. RN reports that the last resulted Hb was 7.9. Pt was admitted on 9/22 after a fall at home that resulted in a large left leg hematoma.     On exam, the pt is AAO x 4, no deficit in mentation, although she states that she feels more tired than usual. Skin was warm and dry.  Lung sounds are CTA bilaterally, O2 saturation 98-100% on 2.5 L [place].  BP remained low during encounter with subsequent readings of 54-62 systolic.  Upon completion of 2nd unit of blood and 1.5 L NS, BP increased to 98/45 with MAP of 60.  HR remained in upper 50-60s with afib on the monitor.    Pt denied abdominal tenderness, BS present and active.  Approximately 200 ml of urine was present in purewick suction container.  L leg hematoma was not visualized due to extensive bandaging.  No drainage or bleeding was present on ace wrap.  The pt denied any loss of sensation or movement to her distal L extremity, cap refill was less than 3 seconds, and a faint 1+ DP pulse was present. The left distal extremity appeared slightly paler in comparison to the R distal extremity.    The pt was transferred to SDU after consultation with critical care and internal medicine.    Interventions:  12 lead EKG, 2nd IV obtained, 2 L NS bolus, increased rate of PRBC infusion, K Centra given per order, Consult to CCM.    Event Summary: Name of Physician Notified: Dr. Sheran Fava at 1500  Name of Consulting Physician Notified: Dr. Rennis Harding with Critical Care at    Outcome: Transferred (Comment)     Pam Drown

## 2017-05-11 NOTE — Consult Note (Signed)
PULMONARY / CRITICAL CARE MEDICINE   Name: [name] MRN: [id number] DOB: [birth year]    ADMISSION DATE:  05/10/2017 CONSULTATION DATE:  05/11/2017  REFERRING MD:  Jerilynn Mages. Short  CHIEF COMPLAINT:  Hypotension  HISTORY OF PRESENT ILLNESS:   This 82 y.o. W F with a hx of atrial fibrillation on apixaban was admitted following a fall with possible bleeding into her LLE. She was seen by Ortho, for which compartment syndrome was not felt to be present,a nd managed with compression, elevation and ice. Ortho assessment today was that there was no evidence of expansion of the hematoma but this afternoon the patient became hypotensive with systolics in the 29N. Despite the low BP she continued to mentate very well, remaining alert and oriented and in no distress. Critical care is now being consulted to assess whether ICU monitoring is required. Prior to my arrival she had received 2U PRBC and NS infusion.  PAST MEDICAL HISTORY :  She  has a past medical history of A-fib ([place]); Arthritis; CAD (coronary artery disease); Chronic atrial fibrillation ([place]); Degenerative arthritis of right knee; Essential hypertension; GERD (gastroesophageal reflux disease); Hyperlipidemia; Morbid obesity ([place]); Myocardial infarction [facility]) (07/2005); On home oxygen therapy; Psoriasis; Pulmonary hypertension ([place]); Right ventricular dysfunction; and Tricuspid regurgitation.  PAST SURGICAL HISTORY: She  has a past surgical history that includes Achilles tendon surgery (Right, 1970s); Colonoscopy; Carpal tunnel release (11/29/2011); Laparoscopic cholecystectomy; Retinal detachment surgery (Right); Eye surgery; Coronary angioplasty with stent (07/2005); and Cardiac catheterization (N/A, 12/07/2015).  No Known Allergies  No current facility-administered medications on file prior to encounter.    Current Outpatient Prescriptions on File Prior to Encounter  Medication Sig  . ALPRAZolam (XANAX) 0.5 MG tablet Take 0.5 mg by mouth  at bedtime.   [name] alum hydroxide-mag trisilicate (GAVISCON) 98-92 MG CHEW chewable tablet Chew 1 tablet by mouth 3 (three) times daily as needed for indigestion or heartburn.  [name] ambrisentan (LETAIRIS) 5 MG tablet Take 1 tablet (5 mg total) by mouth daily. (Patient taking differently: Take 5 mg by mouth at bedtime. )  . atorvastatin (LIPITOR) 80 MG tablet TAKE 1/2 TABLET BY MOUTH DAILY (Patient taking differently: TAKE 1/2 TABLET (40 MG) BY MOUTH DAILY AT BEDTIME)  . brimonidine (ALPHAGAN) 0.2 % ophthalmic solution Place 1 drop into the right eye 2 (two) times daily.   [name] ELIQUIS 5 MG TABS tablet TAKE 1 TABLET BY MOUTH TWICE DAILY (Patient taking differently: TAKE 1 TABLET (5 MG) BY MOUTH TWICE DAILY)  . HYDROcodone-acetaminophen (NORCO/VICODIN) 5-325 MG tablet Take 1 tablet by mouth 3 (three) times daily as needed for moderate pain.   [name] latanoprost (XALATAN) 0.005 % ophthalmic solution Place 1 drop into the right eye at bedtime.   [name] losartan (COZAAR) 100 MG tablet Take 100 mg by mouth at bedtime.   . nitroGLYCERIN (NITROSTAT) 0.4 MG SL tablet Place 0.4 mg under the tongue every 5 (five) minutes as needed for chest pain (x 3 tablets daily). Reported on 12/28/2015  . omeprazole (PRILOSEC) 40 MG capsule Take 40 mg by mouth daily.  . potassium chloride SA (K-DUR,KLOR-CON) 20 MEQ tablet TAKE 1 TABLET BY MOUTH EVERY DAY (Patient taking differently: TAKE 1 TABLET (20 MEQ) BY MOUTH EVERY DAY AFTER BREAKFAST)  . spironolactone (ALDACTONE) 25 MG tablet TAKE 1/2 TABLET BY MOUTH EVERY DAY (Patient taking differently: TAKE 1/2 TABLET (12.5 MG) BY MOUTH EVERY DAY)  . timolol (TIMOPTIC) 0.5 % ophthalmic solution Place 1 drop into both eyes 2 (two) times daily.  [name]  torsemide (DEMADEX) 20 MG tablet TAKE 2 TABLETS BY MOUTH EVERY DAY (Patient taking differently: TAKE 2 TABLETS (40 MG)BY MOUTH EVERY DAY)  . benzonatate (TESSALON) 200 MG capsule Take 1 capsule (200 mg total) by mouth 3 (three) times daily as needed for cough.  (Patient not taking: Reported on 05/10/2017)  . ondansetron (ZOFRAN ODT) 8 MG disintegrating tablet Take 1 tablet (8 mg total) by mouth every 8 (eight) hours as needed for nausea or vomiting. (Patient not taking: Reported on 05/10/2017)    FAMILY HISTORY:  Her indicated that her mother is deceased. She indicated that her father is deceased. She indicated that the status of her sister is unknown.    SOCIAL HISTORY: She  reports that she quit smoking about 30 years ago. Her smoking use included Cigarettes. She has a 0.15 pack-year smoking history. She has never used smokeless tobacco. She reports that she does not drink alcohol or use drugs.  REVIEW OF SYSTEMS:   As per H&P  SUBJECTIVE:  No acute distress. No specific c/o.  VITAL SIGNS: BP (!) 98/45 (BP Location: Right Arm)   Pulse (!) 55   Temp 98.2 F (36.8 C) (Oral)   Resp 18   Ht 5\' 5"  (1.651 m)   Wt 92.1 kg (203 lb)   SpO2 100%   BMI 33.78 kg/m    INTAKE / OUTPUT: I/O last 3 completed shifts: In: 240 [P.O.:240] Out: 150 [Urine:150]  PHYSICAL EXAMINATION: General:  WD/WN W F NAD Neuro:  A & Ox 3. No focal deficits HEENT:  Grossly wnl Cardiovascular:  No murmurs; not tachycardic; irreg Lungs:  Clear to auscultation Abdomen:  Supple, NT, no H-S megaly Musculoskeletal:  LLE bandaged Skin:  Non-pitting edema feet. Pedal pulses not palpable ; slightly delayed cap refill  LABS:  BMET  Recent Labs Lab 05/10/17 1857 05/11/17 0534  NA 140 138  K 4.5 4.7  CL 105 104  CO2 27 24  BUN 76* 72*  CREATININE 1.69* 1.59*  GLUCOSE 125* 98    Electrolytes  Recent Labs Lab 05/10/17 1857 05/11/17 0534  CALCIUM 9.5 9.1    CBC  Recent Labs Lab 05/10/17 1857 05/11/17 0003 05/11/17 0534 05/11/17 0743  WBC 8.8  --  5.8 6.2  HGB 9.4* 8.9* 8.0* 7.9*  HCT 30.8* 28.3* 25.9* 25.6*  PLT 129*  --  109* 110*    Coag's No results for input(s): APTT, INR in the last 168 hours.  Sepsis Markers No results for  input(s): LATICACIDVEN, PROCALCITON, O2SATVEN in the last 168 hours.  ABG No results for input(s): PHART, PCO2ART, PO2ART in the last 168 hours.  Liver Enzymes No results for input(s): AST, ALT, ALKPHOS, BILITOT, ALBUMIN in the last 168 hours.  Cardiac Enzymes No results for input(s): TROPONINI, PROBNP in the last 168 hours.  Glucose No results for input(s): GLUCAP in the last 168 hours.  Imaging Dg Tibia/fibula Left  Result Date: 05/10/2017 CLINICAL DATA:  Left knee pain after fall EXAM: LEFT KNEE - COMPLETE 4+ VIEW; LEFT TIBIA AND FIBULA - 2 VIEW COMPARISON:  Left knee radiographs 04/10/2008 FINDINGS: Left knee: Osteopenia limits the study. Moderate patellofemoral degenerative changes. No definite acute displaced fracture or malalignment. Chondrocalcinosis. Moderate-to-marked narrowing of the medial joint space compartment with bony spurring. Small moderate suprapatellar effusion. Masslike soft tissue opacities along the medial aspect of the left knee and the left proximal lower leg. Left tibia fibula: Osteopenia. Vascular calcifications. No acute fracture or malalignment. 8.5 cm oval soft tissue mass medial  aspect of the proximal lower leg. IMPRESSION: 1. Osteopenia limits the study 2. No definite acute osseous abnormality at the left knee or left tibia fibula 3. Small to moderate suprapatellar effusion 4. Oval soft tissue masses along the medial aspect of the left knee and the medial aspect of the proximal lower leg, could represent large hematoma although other soft tissue mass not excluded, clinical correlation is recommended 5. Chondrocalcinosis with moderate to marked degenerative changes of the left knee Electronically Signed   By: Donavan Foil M.D.   On: 05/10/2017 18:47   Dg Knee Complete 4 Views Left  Result Date: 05/10/2017 CLINICAL DATA:  Left knee pain after fall EXAM: LEFT KNEE - COMPLETE 4+ VIEW; LEFT TIBIA AND FIBULA - 2 VIEW COMPARISON:  Left knee radiographs 04/10/2008  FINDINGS: Left knee: Osteopenia limits the study. Moderate patellofemoral degenerative changes. No definite acute displaced fracture or malalignment. Chondrocalcinosis. Moderate-to-marked narrowing of the medial joint space compartment with bony spurring. Small moderate suprapatellar effusion. Masslike soft tissue opacities along the medial aspect of the left knee and the left proximal lower leg. Left tibia fibula: Osteopenia. Vascular calcifications. No acute fracture or malalignment. 8.5 cm oval soft tissue mass medial aspect of the proximal lower leg. IMPRESSION: 1. Osteopenia limits the study 2. No definite acute osseous abnormality at the left knee or left tibia fibula 3. Small to moderate suprapatellar effusion 4. Oval soft tissue masses along the medial aspect of the left knee and the medial aspect of the proximal lower leg, could represent large hematoma although other soft tissue mass not excluded, clinical correlation is recommended 5. Chondrocalcinosis with moderate to marked degenerative changes of the left knee Electronically Signed   By: Donavan Foil M.D.   On: 05/10/2017 18:47      DISCUSSION: Drop in Hb just under 2 grams since admission. Despite hypotension, there never appeared to be any end-organ compromise. Now her systolic BP is the 98'P after fluids and PRBC. Therefore, I do not see an immediate need for ICU admission, as opposed to a step-down unit.  ASSESSMENT / PLAN:  PULMONARY A: No acute respiratory distress P:   Monitor  CARDIOVASCULAR A:  Appears to be volume replete P:  monitor  HEMATOLOGIC A:   Anemia. Unclear source of blood loss P:  Follow H&H since transfusion    Pulmonary and [place] Pager: [phone]  05/11/2017, 4:06 PM

## 2017-05-11 NOTE — Progress Notes (Signed)
Dr. Tamala Julian is notified about patient's low BP 79/40, HR 57. Order is received to give 250 NS bolus and hold BP medication for now.

## 2017-05-11 NOTE — Progress Notes (Signed)
Paged out for Rapid response but RT was not needed at this time.

## 2017-05-11 NOTE — Progress Notes (Addendum)
Patient became hypotensive this afternoon, likely due to intravascular volume depletion from hematoma in the setting of pulmonary arterial hypertension.  She was given several small boluses this morning.  This afternoon, she has been given 538mL NS + 2 units of blood and currently has 2L of NS running in now.  She remains hypotensive in the 78H systolic.  She has made very little uop since this morning (51mL?).  Fortunately, she is mentating well, speaking and not complaining of any distress.  She has cold hands and feet bilaterally.  Pedal pulses are 1+ bilaterally and CR is equal and < 2 sec on both feet, but left foot is looking a little duskier than before.  Did not remove bandage on left leg because I did not want to distrupt any clot formation that may be taking place.  Eppie Gibson has been ordered for reversal of her apixaban.  She has had episodes of bradycardia while hypotensive on telemetry.  ECG demonstrated a-fib with slow ventricular response, but no STEMI.  Spoke with PCCM regarding patient care.  Will continue to bolus and anticipate quick recovery.  BP is up to low 88F systolic now and doubt she will need vasopressor support.  Spoke with Dr. Haroldine Laws about her Milda Smart which I have held.  Do not expect any rebound PAH symptoms due to holding this medication which is contraindicated in setting of severe anemia.  Once recovered from her hypotension, she will likely need diuresis as she mobilizes the fluid in her left leg.  General surgery notified of change in patient's condition.  May reexamine leg again this afternoon.

## 2017-05-11 NOTE — Progress Notes (Addendum)
**Note [name]** PROGRESS NOTE  [name]  ZHG:[id number] DOB: Feb 19, 1935 DOA: 05/10/2017 PCP: Redmond School, MD  Brief Narrative:   The patient is an 82 year old female with history of atrial fibrillation on Eliquis, coronary artery disease, pulmonary hypertension, chronic kidney disease stage III, chronic diastolic heart failure, and anemia who presented to the emergency department because of bruising of her left leg. She hit her left knee on the corner of a chest of drawers and developed progressive swelling and discoloration and pain of the left knee, left thigh, and left calf. In the emergency department, she was found to have extensive hematoma of the left leg and her hemoglobin dropped approximately 1g/dL.  General surgery was consulted due to concern that she may develop compartment syndrome given the extent of her swelling.  Since admission, her hemoglobin has trended down from 9.4-7.9 and she has been intermittently hypotensive.  Despite IVF, she became increasingly hypotensive.    Assessment & Plan:   Principal Problem:   Thigh hematoma, left, initial encounter Active Problems:   Essential hypertension   Atrial fibrillation (HCC)   CAD (coronary artery disease)   PAH (pulmonary artery hypertension) (HCC)   Chronic diastolic heart failure (HCC)   Chronic respiratory failure (HCC)   CKD (chronic kidney disease), stage III   Leg hematoma, left, initial encounter  Left leg traumatic hematoma  - Appreciate general surgery assistance - Continue compression and icing  Hypotension, possibly due to acute anemia -  Normal saline bolus -  Start telemetry -  Stat CBC -  Transfuse 1 unit PRBC with post-transfusion H&H  Acute blood loss anemia, thrombocytopenia, likely due to hemorrhage into hematoma - Hemoglobin trended down from 9.4-7.9  Atrial fibrillation, rate-controlled a fib on arrival  - CHADS-VASc is 61 (age x2, gender, CAD, CHF)  - Eliquis held   CAD, chest pain-free -  Continue Lipitor   Chronic diastolic CHF and pulmonary arterial hypertension - Managed at home with torsemide, Aldactone, and losartan and Letairis - Holding diuretics due to hypotension - Hold letairis (contraindicated in setting of symptomatic anemia)  CKD stage III, BUN and creatinine trending down slightly  DVT prophylaxis:  SCD on right leg Code Status:  Full code Family Communication:  Patient alone Disposition Plan:  Transfer to stepdown for ongoing management   Consultants:   General surgery  Procedures:  none  Antimicrobials:  Anti-infectives    None       Subjective: Denies lightheadedness, shortness of breath, chest pains. She thinks the swelling in her left leg is about the same as it was last night. Having discomfort in the left leg.  Objective: Vitals:   05/10/17 2339 05/11/17 0107 05/11/17 0225 05/11/17 0555  BP:  (!) 90/54 (!) 93/58 (!) 78/35  Pulse:  87 83 (!) 58  Resp:  18  18  Temp: 98.7 F (37.1 C) 98.6 F (37 C)  98.6 F (37 C)  TempSrc: Oral     SpO2:  98%  100%  Weight:  92.1 kg (203 lb)    Height:  5\' 5"  (1.651 m)      Intake/Output Summary (Last 24 hours) at 05/11/17 1214 Last data filed at 05/11/17 0600  Gross per 24 hour  Intake              240 ml  Output              150 ml  Net  90 ml   Filed Weights   05/11/17 0107  Weight: 92.1 kg (203 lb)    Examination:  General exam:  Adult Female.  No acute distress.  HEENT:  NCAT, MMM Respiratory system: Clear to auscultation bilaterally Cardiovascular system: Regular rate and rhythm, 3/6 systolic murmur.  Warm extremities Gastrointestinal system: Normal active bowel sounds, soft, nondistended, nontender. MSK:  Normal tone and bulk.  Left leg with large ballotable hematoma that extends from the mid-thigh to the mid-calf with large blister on anterior thigh and medial calf, latter of which is weeping serous fluid.  2+ pedal pulse on left foot, able to wiggle toes on  left foot, warm to palpation, < 2 sec CR Neuro:  Grossly intact    Data Reviewed: I have personally reviewed following labs and imaging studies  CBC:  Recent Labs Lab 05/10/17 1857 05/11/17 0003 05/11/17 0534 05/11/17 0743  WBC 8.8  --  5.8 6.2  NEUTROABS 7.2  --   --   --   HGB 9.4* 8.9* 8.0* 7.9*  HCT 30.8* 28.3* 25.9* 25.6*  MCV 93.9  --  93.8 94.1  PLT 129*  --  109* 833*   Basic Metabolic Panel:  Recent Labs Lab 05/10/17 1857 05/11/17 0534  NA 140 138  K 4.5 4.7  CL 105 104  CO2 27 24  GLUCOSE 125* 98  BUN 76* 72*  CREATININE 1.69* 1.59*  CALCIUM 9.5 9.1   GFR: Estimated Creatinine Clearance: 31.6 mL/min (A) (by C-G formula based on SCr of 1.59 mg/dL (H)). Liver Function Tests: No results for input(s): AST, ALT, ALKPHOS, BILITOT, PROT, ALBUMIN in the last 168 hours. No results for input(s): LIPASE, AMYLASE in the last 168 hours. No results for input(s): AMMONIA in the last 168 hours. Coagulation Profile: No results for input(s): INR, PROTIME in the last 168 hours. Cardiac Enzymes: No results for input(s): CKTOTAL, CKMB, CKMBINDEX, TROPONINI in the last 168 hours. BNP (last 3 results) No results for input(s): PROBNP in the last 8760 hours. HbA1C: No results for input(s): HGBA1C in the last 72 hours. CBG: No results for input(s): GLUCAP in the last 168 hours. Lipid Profile: No results for input(s): CHOL, HDL, LDLCALC, TRIG, CHOLHDL, LDLDIRECT in the last 72 hours. Thyroid Function Tests: No results for input(s): TSH, T4TOTAL, FREET4, T3FREE, THYROIDAB in the last 72 hours. Anemia Panel: No results for input(s): VITAMINB12, FOLATE, FERRITIN, TIBC, IRON, RETICCTPCT in the last 72 hours. Urine analysis:    Component Value Date/Time   COLORURINE YELLOW 02/10/2016 1503   APPEARANCEUR CLOUDY (A) 02/10/2016 1503   LABSPEC 1.014 02/10/2016 1503   PHURINE 5.5 02/10/2016 1503   GLUCOSEU NEGATIVE 02/10/2016 1503   HGBUR NEGATIVE 02/10/2016 1503    BILIRUBINUR NEGATIVE 02/10/2016 1503   KETONESUR NEGATIVE 02/10/2016 1503   PROTEINUR NEGATIVE 02/10/2016 1503   NITRITE NEGATIVE 02/10/2016 1503   LEUKOCYTESUR NEGATIVE 02/10/2016 1503   Sepsis Labs: @LABRCNTIP (procalcitonin:4,lacticidven:4)  )No results found for this or any previous visit (from the past 240 hour(s)).    Radiology Studies: Dg Tibia/fibula Left  Result Date: 05/10/2017 CLINICAL DATA:  Left knee pain after fall EXAM: LEFT KNEE - COMPLETE 4+ VIEW; LEFT TIBIA AND FIBULA - 2 VIEW COMPARISON:  Left knee radiographs 04/10/2008 FINDINGS: Left knee: Osteopenia limits the study. Moderate patellofemoral degenerative changes. No definite acute displaced fracture or malalignment. Chondrocalcinosis. Moderate-to-marked narrowing of the medial joint space compartment with bony spurring. Small moderate suprapatellar effusion. Masslike soft tissue opacities along the medial aspect of the left  knee and the left proximal lower leg. Left tibia fibula: Osteopenia. Vascular calcifications. No acute fracture or malalignment. 8.5 cm oval soft tissue mass medial aspect of the proximal lower leg. IMPRESSION: 1. Osteopenia limits the study 2. No definite acute osseous abnormality at the left knee or left tibia fibula 3. Small to moderate suprapatellar effusion 4. Oval soft tissue masses along the medial aspect of the left knee and the medial aspect of the proximal lower leg, could represent large hematoma although other soft tissue mass not excluded, clinical correlation is recommended 5. Chondrocalcinosis with moderate to marked degenerative changes of the left knee Electronically Signed   By: Donavan Foil M.D.   On: 05/10/2017 18:47   Dg Knee Complete 4 Views Left  Result Date: 05/10/2017 CLINICAL DATA:  Left knee pain after fall EXAM: LEFT KNEE - COMPLETE 4+ VIEW; LEFT TIBIA AND FIBULA - 2 VIEW COMPARISON:  Left knee radiographs 04/10/2008 FINDINGS: Left knee: Osteopenia limits the study. Moderate  patellofemoral degenerative changes. No definite acute displaced fracture or malalignment. Chondrocalcinosis. Moderate-to-marked narrowing of the medial joint space compartment with bony spurring. Small moderate suprapatellar effusion. Masslike soft tissue opacities along the medial aspect of the left knee and the left proximal lower leg. Left tibia fibula: Osteopenia. Vascular calcifications. No acute fracture or malalignment. 8.5 cm oval soft tissue mass medial aspect of the proximal lower leg. IMPRESSION: 1. Osteopenia limits the study 2. No definite acute osseous abnormality at the left knee or left tibia fibula 3. Small to moderate suprapatellar effusion 4. Oval soft tissue masses along the medial aspect of the left knee and the medial aspect of the proximal lower leg, could represent large hematoma although other soft tissue mass not excluded, clinical correlation is recommended 5. Chondrocalcinosis with moderate to marked degenerative changes of the left knee Electronically Signed   By: Donavan Foil M.D.   On: 05/10/2017 18:47     Scheduled Meds: . ALPRAZolam  0.5 mg Oral QHS  . ambrisentan  5 mg Oral QPM  . atorvastatin  40 mg Oral q1800  . brimonidine  1 drop Both Eyes BID  . brinzolamide  1 drop Both Eyes BID  . fluticasone  2 spray Each Nare Daily  . latanoprost  1 drop Right Eye QHS  . pantoprazole  40 mg Oral Daily  . sodium chloride flush  3 mL Intravenous Q12H  . timolol  1 drop Both Eyes BID   Continuous Infusions: . sodium chloride       LOS: 0 days    Time spent: 30 min    Janece Canterbury, MD [facility] Pager [phone]  If 7PM-7AM, please contact night-coverage [email or web address] Password [facility] 05/11/2017, 12:14 PM

## 2017-05-11 NOTE — Progress Notes (Signed)
Patient ID: [name], female   DOB: 1935-04-13, 82 y.o.   MRN: [id number] I re-assessed her left thigh and leg hematomas.  They have not expanded since I saw her last night.  There is significant blistering and I unroofed the blisters.  Hew dressing were applied and I placed them as compression-type wraps.  I will continue to follow closely.  No surgery needed right now.  She understands that it will take some time for these to resolve and these tend to look bad for a while.

## 2017-05-12 DIAGNOSIS — I9589 Other hypotension: Secondary | ICD-10-CM

## 2017-05-12 DIAGNOSIS — I959 Hypotension, unspecified: Secondary | ICD-10-CM

## 2017-05-12 DIAGNOSIS — D62 Acute posthemorrhagic anemia: Secondary | ICD-10-CM

## 2017-05-12 DIAGNOSIS — I2721 Secondary pulmonary arterial hypertension: Secondary | ICD-10-CM

## 2017-05-12 DIAGNOSIS — I10 Essential (primary) hypertension: Secondary | ICD-10-CM

## 2017-05-12 LAB — TYPE AND SCREEN
ABO/RH(D): B NEG
ANTIBODY SCREEN: NEGATIVE
Unit division: 0
Unit division: 0

## 2017-05-12 LAB — BPAM RBC
BLOOD PRODUCT EXPIRATION DATE: 201809302359
Blood Product Expiration Date: 201810102359
ISSUE DATE / TIME: 201809231334
ISSUE DATE / TIME: 201809231424
Unit Type and Rh: 1700
Unit Type and Rh: 1700

## 2017-05-12 LAB — BASIC METABOLIC PANEL
Anion gap: 5 (ref 5–15)
BUN: 56 mg/dL — ABNORMAL HIGH (ref 6–20)
CALCIUM: 8.6 mg/dL — AB (ref 8.9–10.3)
CHLORIDE: 110 mmol/L (ref 101–111)
CO2: 24 mmol/L (ref 22–32)
CREATININE: 1.28 mg/dL — AB (ref 0.44–1.00)
GFR calc Af Amer: 45 mL/min — ABNORMAL LOW (ref >60)
GFR, EST NON AFRICAN AMERICAN: 38 mL/min — AB (ref >60)
Glucose, Bld: 96 mg/dL (ref 65–99)
POTASSIUM: 4.3 mmol/L (ref 3.5–5.1)
SODIUM: 139 mmol/L (ref 135–145)

## 2017-05-12 LAB — CBC
HCT: 26.4 % — ABNORMAL LOW (ref 36.0–46.0)
HEMOGLOBIN: 8.3 g/dL — AB (ref 12.0–15.0)
MCH: 29 pg (ref 26.0–34.0)
MCHC: 31.4 g/dL (ref 30.0–36.0)
MCV: 92.3 fL (ref 78.0–100.0)
PLATELETS: 91 10*3/uL — AB (ref 150–400)
RBC: 2.86 MIL/uL — AB (ref 3.87–5.11)
RDW: 15.1 % (ref 11.5–15.5)
WBC: 5.5 10*3/uL (ref 4.0–10.5)

## 2017-05-12 LAB — TROPONIN I: Troponin I: 0.06 ng/mL (ref <0.03)

## 2017-05-12 MED ORDER — KETOCONAZOLE 2 % EX CREA
TOPICAL_CREAM | Freq: Two times a day (BID) | CUTANEOUS | Status: DC | PRN
  Filled 2017-05-12: qty 15

## 2017-05-12 NOTE — Progress Notes (Signed)
PROGRESS NOTE  [name]  LNL:[id number] DOB: [birth year] DOA: 05/10/2017 PCP: Redmond School, MD  Brief Narrative:   The patient is an 82 year old female with history of atrial fibrillation on Eliquis, coronary artery disease, pulmonary hypertension, chronic kidney disease stage III, chronic diastolic heart failure, and anemia who presented to the emergency department because of bruising of her left leg. She hit her left knee on the corner of a chest of drawers and developed progressive swelling and discoloration and pain of the left knee, left thigh, and left calf. In the emergency department, she was found to have extensive hematoma of the left leg and her hemoglobin dropped approximately 1g/dL.  General surgery was consulted due to concern that she may develop compartment syndrome given the extent of her swelling.  Since admission, her hemoglobin has trended down from 9.4-7.9 and she has been intermittently hypotensive.  Despite IVF, she became increasingly hypotensive.  Transfused 2 units PRBC, bolused, and sent to the stepdown unit for monitoring.    Assessment & Plan:   Principal Problem:   Thigh hematoma, left, initial encounter Active Problems:   Essential hypertension   Atrial fibrillation (HCC)   CAD (coronary artery disease)   PAH (pulmonary artery hypertension) (HCC)   Chronic diastolic heart failure (HCC)   Chronic respiratory failure (HCC)   CKD (chronic kidney disease), stage III   Leg hematoma, left, initial encounter   Acute blood loss anemia  Left leg traumatic hematoma  - Appreciate general surgery assistance - Continue compression and icing and elevation - Holding eliquis -  Reversed eliquis on 9/23  Hypotension, likely due to acute anemia in setting of pulmonary hypertension -  Continue IVF -  Transfused 2 unit PRBC on 9/23  Acute blood loss anemia, thrombocytopenia, likely due to hemorrhage into hematoma - Hemoglobin did not respond appropriately  to 2 units and continues to drift down -  Repeat H&H this afternoon  Atrial fibrillation, rate-controlled a fib on arrival  - CHADS-VASc is 58 (age x2, gender, CAD, CHF)  - Eliquis held   CAD, chest pain-free - Continue Lipitor   Chronic diastolic CHF and pulmonary arterial hypertension - holding torsemide, Aldactone, and losartan and Letairis  CKD stage III, BUN and creatinine trending down slightly  DVT prophylaxis:  SCD on right leg Code Status:  Full code Family Communication:  Patient alone Disposition Plan:  Continue in stepdown until hemoglobin stabilizes. Preload dependent.    Consultants:   General surgery  Procedures:  none  Antimicrobials:  Anti-infectives    None       Subjective:  Feeling less sleepy today. Her energy is better today. She denies shortness of breath, lightheadedness, worsening pain in her left lower extremity.  Objective: Vitals:   05/11/17 2350 05/12/17 0330 05/12/17 0500 05/12/17 0800  BP: (!) 91/45 (!) 114/57    Pulse: 62 79    Resp: (!) 21 20    Temp: 98.2 F (36.8 C) 98.6 F (37 C)  98.7 F (37.1 C)  TempSrc: Oral Oral  Oral  SpO2: 98% 100%    Weight:   96.6 kg (213 lb)   Height:        Intake/Output Summary (Last 24 hours) at 05/12/17 1119 Last data filed at 05/12/17 1035  Gross per 24 hour  Intake             2779 ml  Output             1150 ml  Net  1629 ml   Filed Weights   05/11/17 0107 05/12/17 0500  Weight: 92.1 kg (203 lb) 96.6 kg (213 lb)    Examination:  General exam:  Adult Female.  No acute distress.  HEENT:  NCAT, MMM Respiratory system: Clear to auscultation bilaterally Cardiovascular system: Regular rate and ZWCHEN,2/7 systolic murmur.  Warm extremities Gastrointestinal system: Normal active bowel sounds, soft, nondistended, nontender. MSK:  Normal tone and bulk right lower extremity.  Left lower extremity left in ACE bandage and not directly observed.  2+ pedal pulse, < 2 sec CR,  able to wiggle toes.  Feet are warm.   Neuro:  Grossly intact   Data Reviewed: I have personally reviewed following labs and imaging studies  CBC:  Recent Labs Lab 05/10/17 1857 05/11/17 0003 05/11/17 0534 05/11/17 0743 05/11/17 1840 05/12/17 0254  WBC 8.8  --  5.8 6.2  --  5.5  NEUTROABS 7.2  --   --   --   --   --   HGB 9.4* 8.9* 8.0* 7.9* 8.8* 8.3*  HCT 30.8* 28.3* 25.9* 25.6* 27.5* 26.4*  MCV 93.9  --  93.8 94.1  --  92.3  PLT 129*  --  109* 110*  --  91*   Basic Metabolic Panel:  Recent Labs Lab 05/10/17 1857 05/11/17 0534 05/12/17 0254  NA 140 138 139  K 4.5 4.7 4.3  CL 105 104 110  CO2 27 24 24   GLUCOSE 125* 98 96  BUN 76* 72* 56*  CREATININE 1.69* 1.59* 1.28*  CALCIUM 9.5 9.1 8.6*   GFR: Estimated Creatinine Clearance: 40.3 mL/min (A) (by C-G formula based on SCr of 1.28 mg/dL (H)). Liver Function Tests: No results for input(s): AST, ALT, ALKPHOS, BILITOT, PROT, ALBUMIN in the last 168 hours. No results for input(s): LIPASE, AMYLASE in the last 168 hours. No results for input(s): AMMONIA in the last 168 hours. Coagulation Profile: No results for input(s): INR, PROTIME in the last 168 hours. Cardiac Enzymes:  Recent Labs Lab 05/11/17 1557 05/11/17 1840 05/12/17 0254  TROPONINI 0.05* 0.07* 0.06*   BNP (last 3 results) No results for input(s): PROBNP in the last 8760 hours. HbA1C: No results for input(s): HGBA1C in the last 72 hours. CBG: No results for input(s): GLUCAP in the last 168 hours. Lipid Profile: No results for input(s): CHOL, HDL, LDLCALC, TRIG, CHOLHDL, LDLDIRECT in the last 72 hours. Thyroid Function Tests: No results for input(s): TSH, T4TOTAL, FREET4, T3FREE, THYROIDAB in the last 72 hours. Anemia Panel: No results for input(s): VITAMINB12, FOLATE, FERRITIN, TIBC, IRON, RETICCTPCT in the last 72 hours. Urine analysis:    Component Value Date/Time   COLORURINE YELLOW 02/10/2016 1503   APPEARANCEUR CLOUDY (A) 02/10/2016 1503     LABSPEC 1.014 02/10/2016 1503   PHURINE 5.5 02/10/2016 1503   GLUCOSEU NEGATIVE 02/10/2016 1503   HGBUR NEGATIVE 02/10/2016 1503   BILIRUBINUR NEGATIVE 02/10/2016 1503   KETONESUR NEGATIVE 02/10/2016 1503   PROTEINUR NEGATIVE 02/10/2016 1503   NITRITE NEGATIVE 02/10/2016 1503   LEUKOCYTESUR NEGATIVE 02/10/2016 1503   Sepsis Labs: @LABRCNTIP (procalcitonin:4,lacticidven:4)  )No results found for this or any previous visit (from the past 240 hour(s)).    Radiology Studies: Dg Tibia/fibula Left  Result Date: 05/10/2017 CLINICAL DATA:  Left knee pain after fall EXAM: LEFT KNEE - COMPLETE 4+ VIEW; LEFT TIBIA AND FIBULA - 2 VIEW COMPARISON:  Left knee radiographs 04/10/2008 FINDINGS: Left knee: Osteopenia limits the study. Moderate patellofemoral degenerative changes. No definite acute displaced fracture or malalignment. Chondrocalcinosis.  Moderate-to-marked narrowing of the medial joint space compartment with bony spurring. Small moderate suprapatellar effusion. Masslike soft tissue opacities along the medial aspect of the left knee and the left proximal lower leg. Left tibia fibula: Osteopenia. Vascular calcifications. No acute fracture or malalignment. 8.5 cm oval soft tissue mass medial aspect of the proximal lower leg. IMPRESSION: 1. Osteopenia limits the study 2. No definite acute osseous abnormality at the left knee or left tibia fibula 3. Small to moderate suprapatellar effusion 4. Oval soft tissue masses along the medial aspect of the left knee and the medial aspect of the proximal lower leg, could represent large hematoma although other soft tissue mass not excluded, clinical correlation is recommended 5. Chondrocalcinosis with moderate to marked degenerative changes of the left knee Electronically Signed   By: Donavan Foil M.D.   On: 05/10/2017 18:47   Dg Knee Complete 4 Views Left  Result Date: 05/10/2017 CLINICAL DATA:  Left knee pain after fall EXAM: LEFT KNEE - COMPLETE 4+ VIEW;  LEFT TIBIA AND FIBULA - 2 VIEW COMPARISON:  Left knee radiographs 04/10/2008 FINDINGS: Left knee: Osteopenia limits the study. Moderate patellofemoral degenerative changes. No definite acute displaced fracture or malalignment. Chondrocalcinosis. Moderate-to-marked narrowing of the medial joint space compartment with bony spurring. Small moderate suprapatellar effusion. Masslike soft tissue opacities along the medial aspect of the left knee and the left proximal lower leg. Left tibia fibula: Osteopenia. Vascular calcifications. No acute fracture or malalignment. 8.5 cm oval soft tissue mass medial aspect of the proximal lower leg. IMPRESSION: 1. Osteopenia limits the study 2. No definite acute osseous abnormality at the left knee or left tibia fibula 3. Small to moderate suprapatellar effusion 4. Oval soft tissue masses along the medial aspect of the left knee and the medial aspect of the proximal lower leg, could represent large hematoma although other soft tissue mass not excluded, clinical correlation is recommended 5. Chondrocalcinosis with moderate to marked degenerative changes of the left knee Electronically Signed   By: Donavan Foil M.D.   On: 05/10/2017 18:47     Scheduled Meds: . ALPRAZolam  0.5 mg Oral QHS  . atorvastatin  40 mg Oral q1800  . brimonidine  1 drop Both Eyes BID  . brinzolamide  1 drop Both Eyes BID  . fluticasone  2 spray Each Nare Daily  . latanoprost  1 drop Right Eye QHS  . pantoprazole  40 mg Oral Daily  . sodium chloride flush  3 mL Intravenous Q12H  . timolol  1 drop Both Eyes BID   Continuous Infusions: . sodium chloride    . sodium chloride 100 mL/hr (05/12/17 0001)  . sodium chloride       LOS: 1 day    Time spent: 30 min    Janece Canterbury, MD [facility] Pager [phone]  If 7PM-7AM, please contact night-coverage [email or web address] Password [email or web address] 05/12/2017, 11:19 AM

## 2017-05-12 NOTE — Progress Notes (Signed)
PULMONARY / CRITICAL CARE MEDICINE   Name: [name] MRN: [id number] DOB: April 21, 1935    ADMISSION DATE:  05/10/2017 CONSULTATION DATE:  05/11/2017  REFERRING MD:  Jerilynn Mages. Short  CHIEF COMPLAINT:  Hypotension  HISTORY OF PRESENT ILLNESS:   This 82 y.o. W F with a hx of atrial fibrillation on apixaban was admitted 9/22 following a fall with possible bleeding into her LLE. She was seen by Ortho, for which compartment syndrome was not felt to be present, and managed with compression, elevation and ice. On 9/23 the patient became hypotensive with systolics in the 38S. Despite the low BP she continued to mentate very well, remaining alert and oriented and in no distress. Critical care is now being consulted to assess whether ICU monitoring is required.    SUBJECTIVE:  No c/o.  SBP 90's.  Talking on phone.   VITAL SIGNS: BP (!) 114/57 (BP Location: Right Arm)   Pulse 79   Temp 98.7 F (37.1 C) (Oral)   Resp 20   Ht 5\' 5"  (1.651 m)   Wt 96.6 kg (213 lb)   SpO2 100%   BMI 35.45 kg/m    INTAKE / OUTPUT: I/O last 3 completed shifts: In: 2479 [P.O.:480; I.V.:1003; Blood:315; IV Piggyback:681] Out: 1200 [Urine:1200]  PHYSICAL EXAMINATION: General:  Very pleasant elderly female, NAD  Neuro:  A & Ox 3. No focal deficits HEENT:  Mm moist, no JVD Cardiovascular:  No murmurs; not tachycardic; irreg Lungs:  resps even non labored on RA, Clear to auscultation Abdomen:  Supple, NT, no H-S megaly Musculoskeletal:  LLE bandaged Skin:  Non-pitting edema feet. Moves L toes, warm L foot  LABS:  BMET  Recent Labs Lab 05/10/17 1857 05/11/17 0534 05/12/17 0254  NA 140 138 139  K 4.5 4.7 4.3  CL 105 104 110  CO2 27 24 24   BUN 76* 72* 56*  CREATININE 1.69* 1.59* 1.28*  GLUCOSE 125* 98 96    Electrolytes  Recent Labs Lab 05/10/17 1857 05/11/17 0534 05/12/17 0254  CALCIUM 9.5 9.1 8.6*    CBC  Recent Labs Lab 05/11/17 0534 05/11/17 0743 05/11/17 1840 05/12/17 0254   WBC 5.8 6.2  --  5.5  HGB 8.0* 7.9* 8.8* 8.3*  HCT 25.9* 25.6* 27.5* 26.4*  PLT 109* 110*  --  91*    Coag's No results for input(s): APTT, INR in the last 168 hours.  Sepsis Markers  Recent Labs Lab 05/11/17 1557  LATICACIDVEN 1.8    ABG No results for input(s): PHART, PCO2ART, PO2ART in the last 168 hours.  Liver Enzymes No results for input(s): AST, ALT, ALKPHOS, BILITOT, ALBUMIN in the last 168 hours.  Cardiac Enzymes  Recent Labs Lab 05/11/17 1557 05/11/17 1840 05/12/17 0254  TROPONINI 0.05* 0.07* 0.06*    Glucose No results for input(s): GLUCAP in the last 168 hours.  Imaging No results found.    DISCUSSION:  82yo female with hypotension and anemia (2gm Hgb drop since admit) in setting LLE hematoma after fall on eliquis.  No end organ compromise. Mental status superb, Scr stable, not tachycardic.  BP improved with PRBC and hgb low but fairly stable.     Hypotension - likely r/t volume loss/anemia  Anemia  LLE bleeding/hematoma  thrombocytopenia  P:  Continue SDU monitoring - does not need ICU at this time Gentle volume with NS and PRBC  Trend h/h  Trend troponin  Ortho following - continue compression and monitor hematoma  Holding eliquis   Chronic diastolic  CHF and pulmonary arterial hypertension Hx AFib  P:  Holding home anti-HTN, diuretics and letairis for now  Resume when able  Hold eliquis as above   CKDIII - stable despite hypotension  P: F/u chem  Volume as above   PCCM signing off for now, please call back if needed.    Nickolas Madrid, NP 05/12/2017  11:01 AM Pager: [phone] or [phone]

## 2017-05-13 LAB — BASIC METABOLIC PANEL
ANION GAP: 3 — AB (ref 5–15)
BUN: 31 mg/dL — ABNORMAL HIGH (ref 6–20)
CALCIUM: 8.7 mg/dL — AB (ref 8.9–10.3)
CO2: 26 mmol/L (ref 22–32)
Chloride: 113 mmol/L — ABNORMAL HIGH (ref 101–111)
Creatinine, Ser: 0.88 mg/dL (ref 0.44–1.00)
Glucose, Bld: 99 mg/dL (ref 65–99)
Potassium: 4.4 mmol/L (ref 3.5–5.1)
SODIUM: 142 mmol/L (ref 135–145)

## 2017-05-13 LAB — CBC
HCT: 24.7 % — ABNORMAL LOW (ref 36.0–46.0)
HEMATOCRIT: 25.5 % — AB (ref 36.0–46.0)
HEMOGLOBIN: 7.7 g/dL — AB (ref 12.0–15.0)
HEMOGLOBIN: 7.9 g/dL — AB (ref 12.0–15.0)
MCH: 29.4 pg (ref 26.0–34.0)
MCH: 29.6 pg (ref 26.0–34.0)
MCHC: 31.2 g/dL (ref 30.0–36.0)
MCHC: 31 g/dL (ref 30.0–36.0)
MCV: 94.3 fL (ref 78.0–100.0)
MCV: 95.5 fL (ref 78.0–100.0)
PLATELETS: 88 10*3/uL — AB (ref 150–400)
Platelets: 89 10*3/uL — ABNORMAL LOW (ref 150–400)
RBC: 2.62 MIL/uL — AB (ref 3.87–5.11)
RBC: 2.67 MIL/uL — AB (ref 3.87–5.11)
RDW: 14.8 % (ref 11.5–15.5)
RDW: 15 % (ref 11.5–15.5)
WBC: 5.8 10*3/uL (ref 4.0–10.5)
WBC: 5.9 10*3/uL (ref 4.0–10.5)

## 2017-05-13 LAB — MRSA PCR SCREENING: MRSA BY PCR: NEGATIVE

## 2017-05-13 NOTE — Progress Notes (Signed)
Patient ID: [name], female   DOB: Feb 09, 1935, 82 y.o.   MRN: [id number] I removed her left thigh and leg dressing today to assess her hematoma.  It has not increased in size.  There is some superficial skin breakdown to be expected.  I showed this to her and her husband.  This will take some time to improve.  Distally, her left foot is well perfused with normal sensation and she moves her foot and toes easily.  Will continue to follow closely.

## 2017-05-13 NOTE — Progress Notes (Signed)
PROGRESS NOTE  [name]  YTK:[id number] DOB: 1934-12-06 DOA: 05/10/2017 PCP: Redmond School, MD  Brief Narrative:   The patient is an 82 year old female with history of atrial fibrillation on Eliquis, coronary artery disease, pulmonary hypertension, chronic kidney disease stage III, chronic diastolic heart failure, and anemia who presented to the emergency department because of bruising of her left leg. She hit her left knee on the corner of a chest of drawers and developed progressive swelling and discoloration and pain of the left knee, left thigh, and left calf. In the emergency department, she was found to have extensive hematoma of the left leg and her hemoglobin dropped approximately 1g/dL.  General surgery was consulted due to concern that she may develop compartment syndrome given the extent of her swelling.  Since admission, her hemoglobin has trended down from 9.4-7.9 and she has been intermittently hypotensive.  Despite IVF, she became increasingly hypotensive.  Transfused 2 units PRBC, bolused, and sent to the stepdown unit for monitoring where her blood pressure improved.  Safe to transfer back to telemetry today.     Assessment & Plan:   Principal Problem:   Thigh hematoma, left, initial encounter Active Problems:   Essential hypertension   Atrial fibrillation (HCC)   CAD (coronary artery disease)   PAH (pulmonary artery hypertension) (HCC)   Chronic diastolic heart failure (HCC)   Chronic respiratory failure (HCC)   CKD (chronic kidney disease), stage III   Leg hematoma, left, initial encounter   Acute blood loss anemia   Arterial hypotension  Left leg traumatic hematoma  - Appreciate general surgery assistance - Continue compression and icing and elevation -  Holding eliquis -  Reversed eliquis on 9/23  Hypotension, likely due to acute anemia in setting of pulmonary hypertension -  D/c IVF -  Transfused 2 unit PRBC on 9/23  Acute blood loss anemia,  thrombocytopenia, likely due to hemorrhage into hematoma -  Hemoglobin did not respond appropriately to 2 units but appears to have stabilized around 7.9g/dl  Atrial fibrillation, rate-controlled a fib on arrival  - CHADS-VASc is 56 (age x2, gender, CAD, CHF)  - Eliquis held   CAD, chest pain-free - Continue Lipitor   Chronic diastolic CHF and pulmonary arterial hypertension with intermittent hypotension - holding torsemide, Aldactone, and losartan and Letairis  CKD stage III, BUN and creatinine trending down slightly  DVT prophylaxis:  SCD on right leg Code Status:  Full code Family Communication:  Patient alone Disposition Plan:  Transfer to telemetry  Consultants:   General surgery  Procedures:  none  Antimicrobials:  Anti-infectives    None       Subjective:  Feeling less sleepy today.  Denies SOB, cough.  Denies increasing pain in leg.    Objective: Vitals:   05/13/17 0500 05/13/17 0731 05/13/17 1108 05/13/17 1525  BP:  (!) 105/47 (!) 97/53 (!) 109/57  Pulse:  84 73 (!) 50  Resp:  (!) 22 (!) 21 18  Temp:  98.8 F (37.1 C) 98.3 F (36.8 C) 98.5 F (36.9 C)  TempSrc:  Oral Oral Oral  SpO2:  100% 100% 98%  Weight: 100.7 kg (222 lb)     Height:        Intake/Output Summary (Last 24 hours) at 05/13/17 1620 Last data filed at 05/13/17 1300  Gross per 24 hour  Intake             2463 ml  Output  2050 ml  Net              413 ml   Filed Weights   05/11/17 0107 05/12/17 0500 05/13/17 0500  Weight: 92.1 kg (203 lb) 96.6 kg (213 lb) 100.7 kg (222 lb)    Examination:  General exam:  Adult female.  No acute distress.  HEENT:  NCAT, MMM Respiratory system: Clear to auscultation bilaterally Cardiovascular system: Regular rate and rhythm, normal S1, S2, 3/6 systolic murmur.  Warm extremities Gastrointestinal system: Normal active bowel sounds, soft, nondistended, nontender. MSK:  Normal tone and bulk, no lower extremity edema  Left lower  extremity in ACE bandage and not observed today as recently examined by general surgery.  2+ pedal pulse, < 2 sec CR, able to wiggle toes, feet are warm Neuro:  Grossly intact    Data Reviewed: I have personally reviewed following labs and imaging studies  CBC:  Recent Labs Lab 05/10/17 1857  05/11/17 0534 05/11/17 0743 05/11/17 1840 05/12/17 0254 05/13/17 0211 05/13/17 1509  WBC 8.8  --  5.8 6.2  --  5.5 5.8 5.9  NEUTROABS 7.2  --   --   --   --   --   --   --   HGB 9.4*  < > 8.0* 7.9* 8.8* 8.3* 7.7* 7.9*  HCT 30.8*  < > 25.9* 25.6* 27.5* 26.4* 24.7* 25.5*  MCV 93.9  --  93.8 94.1  --  92.3 94.3 95.5  PLT 129*  --  109* 110*  --  91* 88* 89*  < > = values in this interval not displayed. Basic Metabolic Panel:  Recent Labs Lab 05/10/17 1857 05/11/17 0534 05/12/17 0254 05/13/17 0211  NA 140 138 139 142  K 4.5 4.7 4.3 4.4  CL 105 104 110 113*  CO2 27 24 24 26   GLUCOSE 125* 98 96 99  BUN 76* 72* 56* 31*  CREATININE 1.69* 1.59* 1.28* 0.88  CALCIUM 9.5 9.1 8.6* 8.7*   GFR: Estimated Creatinine Clearance: 60 mL/min (by C-G formula based on SCr of 0.88 mg/dL). Liver Function Tests: No results for input(s): AST, ALT, ALKPHOS, BILITOT, PROT, ALBUMIN in the last 168 hours. No results for input(s): LIPASE, AMYLASE in the last 168 hours. No results for input(s): AMMONIA in the last 168 hours. Coagulation Profile: No results for input(s): INR, PROTIME in the last 168 hours. Cardiac Enzymes:  Recent Labs Lab 05/11/17 1557 05/11/17 1840 05/12/17 0254  TROPONINI 0.05* 0.07* 0.06*   BNP (last 3 results) No results for input(s): PROBNP in the last 8760 hours. HbA1C: No results for input(s): HGBA1C in the last 72 hours. CBG: No results for input(s): GLUCAP in the last 168 hours. Lipid Profile: No results for input(s): CHOL, HDL, LDLCALC, TRIG, CHOLHDL, LDLDIRECT in the last 72 hours. Thyroid Function Tests: No results for input(s): TSH, T4TOTAL, FREET4, T3FREE,  THYROIDAB in the last 72 hours. Anemia Panel: No results for input(s): VITAMINB12, FOLATE, FERRITIN, TIBC, IRON, RETICCTPCT in the last 72 hours. Urine analysis:    Component Value Date/Time   COLORURINE YELLOW 02/10/2016 1503   APPEARANCEUR CLOUDY (A) 02/10/2016 1503   LABSPEC 1.014 02/10/2016 1503   PHURINE 5.5 02/10/2016 1503   GLUCOSEU NEGATIVE 02/10/2016 1503   HGBUR NEGATIVE 02/10/2016 1503   BILIRUBINUR NEGATIVE 02/10/2016 1503   KETONESUR NEGATIVE 02/10/2016 1503   PROTEINUR NEGATIVE 02/10/2016 1503   NITRITE NEGATIVE 02/10/2016 1503   LEUKOCYTESUR NEGATIVE 02/10/2016 1503   Sepsis Labs: @LABRCNTIP (procalcitonin:4,lacticidven:4)  ) Recent Results (from  the past 240 hour(s))  MRSA PCR Screening     Status: None   Collection Time: 05/13/17  6:57 AM  Result Value Ref Range Status   MRSA by PCR NEGATIVE NEGATIVE Final    Comment:        The GeneXpert MRSA Assay (FDA approved for NASAL specimens only), is one component of a comprehensive MRSA colonization surveillance program. It is not intended to diagnose MRSA infection nor to guide or monitor treatment for MRSA infections.       Radiology Studies: No results found.   Scheduled Meds: . ALPRAZolam  0.5 mg Oral QHS  . atorvastatin  40 mg Oral q1800  . brimonidine  1 drop Both Eyes BID  . brinzolamide  1 drop Both Eyes BID  . fluticasone  2 spray Each Nare Daily  . latanoprost  1 drop Right Eye QHS  . pantoprazole  40 mg Oral Daily  . sodium chloride flush  3 mL Intravenous Q12H  . timolol  1 drop Both Eyes BID   Continuous Infusions: . sodium chloride    . sodium chloride 100 mL/hr at 05/13/17 0640  . sodium chloride       LOS: 2 days    Time spent: 30 min    Janece Canterbury, MD [facility] Pager [phone]  If 7PM-7AM, please contact night-coverage [email or web address] Password [email or web address] 05/13/2017, 4:20 PM

## 2017-05-14 LAB — BASIC METABOLIC PANEL
ANION GAP: 5 (ref 5–15)
BUN: 17 mg/dL (ref 6–20)
CALCIUM: 9 mg/dL (ref 8.9–10.3)
CO2: 27 mmol/L (ref 22–32)
Chloride: 111 mmol/L (ref 101–111)
Creatinine, Ser: 0.81 mg/dL (ref 0.44–1.00)
GLUCOSE: 99 mg/dL (ref 65–99)
POTASSIUM: 4.4 mmol/L (ref 3.5–5.1)
Sodium: 143 mmol/L (ref 135–145)

## 2017-05-14 LAB — CBC
HEMATOCRIT: 25.6 % — AB (ref 36.0–46.0)
HEMOGLOBIN: 7.9 g/dL — AB (ref 12.0–15.0)
MCH: 29.6 pg (ref 26.0–34.0)
MCHC: 30.9 g/dL (ref 30.0–36.0)
MCV: 95.9 fL (ref 78.0–100.0)
Platelets: 94 10*3/uL — ABNORMAL LOW (ref 150–400)
RBC: 2.67 MIL/uL — AB (ref 3.87–5.11)
RDW: 15 % (ref 11.5–15.5)
WBC: 6.7 10*3/uL (ref 4.0–10.5)

## 2017-05-14 MED ORDER — POLYETHYLENE GLYCOL 3350 17 G PO PACK
17.0000 g | PACK | Freq: Every day | ORAL | Status: DC
  Administered 2017-05-14 – 2017-05-16 (×3): 17 g via ORAL
  Filled 2017-05-14 (×3): qty 1

## 2017-05-14 NOTE — Progress Notes (Signed)
PROGRESS NOTE    [name]  QMV:[id number] DOB: 12/01/1934 DOA: 05/10/2017 PCP: Redmond School, MD    Brief Narrative: The patient is an 82 year old female with history of atrial fibrillation on Eliquis, coronary artery disease, pulmonary hypertension, chronic kidney disease stage III, chronic diastolic heart failure, and anemia who presented to the emergency department because of bruising of her left leg. She hit her left knee on the corner of a chest of drawers and developed progressive swelling and discoloration and pain of the left knee, left thigh, and left calf. In the emergency department, she was found to have extensive hematoma of the left leg and her hemoglobin dropped approximately 1g/dL.  General surgery was consulted due to concern that she may develop compartment syndrome given the extent of her swelling.  Since admission, her hemoglobin has trended down from 9.4-7.9 and she has been intermittently hypotensive.  Despite IVF, she became increasingly hypotensive.  Transfused 2 units PRBC, bolused, and sent to the stepdown unit for monitoring where her blood pressure improved.  Safe to transfer back to telemetry today.  She has stable Hb, and is doing well.  Pain is adequately controlled.   She has now wishes to go to rehab prior to going home.    Assessment & Plan:   Principal Problem:   Thigh hematoma, left, initial encounter Active Problems:   Essential hypertension   Atrial fibrillation (HCC)   CAD (coronary artery disease)   PAH (pulmonary artery hypertension) (HCC)   Chronic diastolic heart failure (HCC)   Chronic respiratory failure (HCC)   CKD (chronic kidney disease), stage III   Leg hematoma, left, initial encounter   Acute blood loss anemia   Arterial hypotension  Left leg traumatic hematoma  - Appreciate general surgery assistance - Continue compression and icing and elevation -  Holding eliquis -  Reversed eliquis on 9/23  Hypotension, likely due to  acute anemia in setting of pulmonary hypertension -  D/c IVF -  Transfused 2 unit PRBC on 9/23. -This has improved.   Acute blood loss anemia, thrombocytopenia, likely due to hemorrhage into hematoma -  Hemoglobin did not respond appropriately to 2 units but appears to have stabilized around 7.9g/dl  Atrial fibrillation, rate-controlled a fib on arrival  - CHADS-VASc is 55 (age x2, gender, CAD, CHF)  - Eliquis held   CAD, chest pain-free - Continue Lipitor   Chronic diastolic CHF and pulmonary arterial hypertension with intermittent hypotension - holding torsemide, Aldactone, and losartan and Letairis.  Due to AKI on CKD recently, and soft BP, will hold.    DVT prophylaxis:  SCD on right leg Code Status:  Full code Family Communication:  Patient alone Disposition Plan:  Transfer to telemetry  Consultants:   General surgery  Procedures:   None.   Antimicrobials: Anti-infectives    None       Subjective:  "I would like to get stronger by going to STR"    Objective: Vitals:   05/13/17 2140 05/14/17 0026 05/14/17 0622 05/14/17 1200  BP: (!) 116/54 (!) 102/46 (!) 123/56 (!) 119/44  Pulse: 80 (!) 115 72 85  Resp: 20 20 16    Temp: (!) 97.4 F (36.3 C) 98.1 F (36.7 C) (!) 97.4 F (36.3 C) 97.7 F (36.5 C)  TempSrc: Oral Oral Oral Oral  SpO2: 97% 97% 99% 99%  Weight:   103.6 kg (228 lb 4.8 oz)   Height:        Intake/Output Summary (Last 24 hours) at  05/14/17 1705 Last data filed at 05/14/17 1044  Gross per 24 hour  Intake                0 ml  Output              901 ml  Net             -901 ml   Filed Weights   05/12/17 0500 05/13/17 0500 05/14/17 0622  Weight: 96.6 kg (213 lb) 100.7 kg (222 lb) 103.6 kg (228 lb 4.8 oz)    Examination:  General exam: Appears calm and comfortable  Respiratory system: Clear to auscultation. Respiratory effort normal. Cardiovascular system: S1 & S2 heard, RRR. No JVD, murmurs, rubs, gallops or clicks. No pedal  edema. Gastrointestinal system: Abdomen is nondistended, soft and nontender. No organomegaly or masses felt. Normal bowel sounds heard. Central nervous system: Alert and oriented. No focal neurological deficits. Extremities: Symmetric 5 x 5 power. Skin: No rashes, lesions or ulcers Psychiatry: Judgement and insight appear normal. Mood & affect appropriate.   Data Reviewed: I have personally reviewed following labs and imaging studies  CBC:  Recent Labs Lab 05/10/17 1857  05/11/17 0743 05/11/17 1840 05/12/17 0254 05/13/17 0211 05/13/17 1509 05/14/17 0553  WBC 8.8  < > 6.2  --  5.5 5.8 5.9 6.7  NEUTROABS 7.2  --   --   --   --   --   --   --   HGB 9.4*  < > 7.9* 8.8* 8.3* 7.7* 7.9* 7.9*  HCT 30.8*  < > 25.6* 27.5* 26.4* 24.7* 25.5* 25.6*  MCV 93.9  < > 94.1  --  92.3 94.3 95.5 95.9  PLT 129*  < > 110*  --  91* 88* 89* 94*  < > = values in this interval not displayed. Basic Metabolic Panel:  Recent Labs Lab 05/10/17 1857 05/11/17 0534 05/12/17 0254 05/13/17 0211 05/14/17 0553  NA 140 138 139 142 143  K 4.5 4.7 4.3 4.4 4.4  CL 105 104 110 113* 111  CO2 27 24 24 26 27   GLUCOSE 125* 98 96 99 99  BUN 76* 72* 56* 31* 17  CREATININE 1.69* 1.59* 1.28* 0.88 0.81  CALCIUM 9.5 9.1 8.6* 8.7* 9.0   Cardiac Enzymes:  Recent Labs Lab 05/11/17 1557 05/11/17 1840 05/12/17 0254  TROPONINI 0.05* 0.07* 0.06*   Sepsis Labs:  Recent Labs Lab 05/11/17 1557  LATICACIDVEN 1.8    Recent Results (from the past 240 hour(s))  MRSA PCR Screening     Status: None   Collection Time: 05/13/17  6:57 AM  Result Value Ref Range Status   MRSA by PCR NEGATIVE NEGATIVE Final    Comment:        The GeneXpert MRSA Assay (FDA approved for NASAL specimens only), is one component of a comprehensive MRSA colonization surveillance program. It is not intended to diagnose MRSA infection nor to guide or monitor treatment for MRSA infections.      Radiology Studies: No results  found.  Scheduled Meds: . ALPRAZolam  0.5 mg Oral QHS  . atorvastatin  40 mg Oral q1800  . brimonidine  1 drop Both Eyes BID  . brinzolamide  1 drop Both Eyes BID  . fluticasone  2 spray Each Nare Daily  . latanoprost  1 drop Right Eye QHS  . pantoprazole  40 mg Oral Daily  . polyethylene glycol  17 g Oral Daily  . timolol  1 drop Both Eyes BID  Continuous Infusions: . sodium chloride       LOS: 3 days   Yavonne Kiss, MD FACP Hospitalist.   If 7PM-7AM, please contact night-coverage [email or web address] Password [email or web address] 05/14/2017, 5:05 PM

## 2017-05-14 NOTE — Consult Note (Signed)
   [facility] CM Inpatient Consult   05/14/2017  [name] October 28, 1934 [id number]  Patient was assessed for [place] Management for community services. Patient was previously active with [place] Management in the [facility] HF calls.  Met with patient at bedside regarding being restarted with [facility] services.  Patient endorses Dr. Redmond School, [facility] is her primary care provider.    Explained [facility] services and the patient wanted follow up for HF support.   Patient states she would not mind having a nurse to follow up.  She states she has no issues with getting her medications but has a medicine that cost over $8,000.00 a month for her heart and lungs but has not had it in the last few days and feels she is getting more short of breath since being off of it.  Patient states she doesn't remember the name right now. Spoke with her nurse regarding the medicine and the patient states her husband has brought the medication to the hospital but still hasn't got it.  Medication was Milda Smart that the husband brought in.    Consent form signed and folder with [place] Management information given.    Updated inpatient RNCM that [facility] will follow for post hospital care management with [facility] RN Telephonic Care Coordinator.   Of note, [facility] Care Management services does not replace or interfere with any services that are arranged by inpatient case management or social work. For additional questions or referrals please contact:  Natividad Brood, RN BSN [facility] Liaison  [phone] business mobile phone Toll free office [phone]

## 2017-05-15 LAB — CBC
HCT: 26.3 % — ABNORMAL LOW (ref 36.0–46.0)
HEMOGLOBIN: 8 g/dL — AB (ref 12.0–15.0)
MCH: 29.2 pg (ref 26.0–34.0)
MCHC: 30.4 g/dL (ref 30.0–36.0)
MCV: 96 fL (ref 78.0–100.0)
Platelets: 126 10*3/uL — ABNORMAL LOW (ref 150–400)
RBC: 2.74 MIL/uL — AB (ref 3.87–5.11)
RDW: 14.5 % (ref 11.5–15.5)
WBC: 8.4 10*3/uL (ref 4.0–10.5)

## 2017-05-15 LAB — BASIC METABOLIC PANEL
ANION GAP: 6 (ref 5–15)
BUN: 23 mg/dL — AB (ref 6–20)
CHLORIDE: 106 mmol/L (ref 101–111)
CO2: 25 mmol/L (ref 22–32)
Calcium: 9.1 mg/dL (ref 8.9–10.3)
Creatinine, Ser: 1.03 mg/dL — ABNORMAL HIGH (ref 0.44–1.00)
GFR calc Af Amer: 58 mL/min — ABNORMAL LOW (ref >60)
GFR calc non Af Amer: 50 mL/min — ABNORMAL LOW (ref >60)
GLUCOSE: 151 mg/dL — AB (ref 65–99)
POTASSIUM: 4.8 mmol/L (ref 3.5–5.1)
Sodium: 137 mmol/L (ref 135–145)

## 2017-05-15 NOTE — Progress Notes (Signed)
Patient ID: [name], female   DOB: September 29, 1934, 82 y.o.   MRN: [id number] I changed her dressing at the bedside.  Her left medial-distal thing and proximal tibia have a large hematoma.  There is weeping edema and superficial skin necrosis due to the stretching of her skin.  She will continue to need local skin care and dressing daily or at least every other day.  This will take a long time to resolve and continue to look bad visually for a while until it gets better.  I am fine with consulting the Wound Nursing Service for there opinion.  I would still not operate on this due to the problems with wound healing that surgery can cause as well.

## 2017-05-15 NOTE — Clinical Social Work Note (Signed)
Clinical Social Work Assessment  Patient Details  Name: [name] MRN: [id number] Date of Birth: April 16, 1935  Date of referral:  05/15/17               Reason for consult:  Facility Placement, Discharge Planning                Permission sought to share information with:  Chartered certified accountant granted to share information::  Yes, Verbal Permission Granted  Name::        Agency::  SNF's  Relationship::     Contact Information:     Housing/Transportation Living arrangements for the past 2 months:  Single Family Home Source of Information:  Patient, Medical Team Patient Interpreter Needed:  None Criminal Activity/Legal Involvement Pertinent to Current Situation/Hospitalization:  No - Comment as needed Significant Relationships:  Spouse, Adult Children Lives with:  Spouse Do you feel safe going back to the place where you live?  Yes Need for family participation in patient care:  Yes (Comment)  Care giving concerns:  PT recommending SNF once medically stable for discharge.   Social Worker assessment / plan:  CSW met with patient. No supports at bedside. CSW introduced role and explained that PT recommendations would be discussed. Patient agreeable to SNF placement. First preference is [facility]. Admissions coordinator notified. CSW will fax referral to other facilities in that area as a back up. No further concerns. CSW encouraged patient to contact CSW as needed. CSW will continue to follow patient for support and facilitate discharge to SNF once medically stable.  Employment status:  Retired Forensic scientist:  Medicare PT Recommendations:  [place] / Referral to community resources:  [place]  Patient/Family's Response to care:  Patient agreeable to SNF placement. Patient's family supportive and involved in patient's care. Patient appreciated social work intervention.  Patient/Family's  Understanding of and Emotional Response to Diagnosis, Current Treatment, and Prognosis:  Patient has a good understanding of the reason for admission and her need for rehab prior to returning home. Patient was wanting to go to SNF before being evaluated by PT. Patient appears happy with hospital care.  Emotional Assessment Appearance:  Appears stated age Attitude/Demeanor/Rapport:  Other (Pleasant) Affect (typically observed):  Accepting, Appropriate, Calm, Pleasant Orientation:  Oriented to Self, Oriented to Place, Oriented to  Time, Oriented to Situation Alcohol / Substance use:  Never Used Psych involvement (Current and /or in the community):  No (Comment)  Discharge Needs  Concerns to be addressed:  Care Coordination Readmission within the last 30 days:  No Current discharge risk:  Dependent with Mobility Barriers to Discharge:  Continued Medical Work up   Candie Chroman, LCSW 05/15/2017, 2:55 PM

## 2017-05-15 NOTE — Consult Note (Addendum)
[place] Nurse wound consult note Reason for Consult: Consult requested for left leg wounds.  Pt has been followed by the ortho service; refer to previous progress notes.  Requested to recommend topical treatment since surgery will not be performed to the sites.   Wound type: 2 large hemtomas; 8X10cm to upper thigh, 80% dry eschar, tightly adhered, and 20% dry red wound bed, no odor or drainage. 7X7cm  near knee, 90% dry eschar, tightly adhered, and 10% dry red wound bed, no odor or drainage. Blisters and patchy areas of partial thickness skin loss surrounding the hematomas, which are raised above skin level, no active bleeding. Dressing procedure/placement/frequency: Xeroform gauze to avoid adherence to wound bed and assist with softening nonviable eschar.  No family at the bedside to discuss plan of care. Pt could benefit from follow-up at the outpatient wound care center for possible eventual local sharp debridement ; please order if desired. Please re-consult if further assistance is needed.  Thank-you,  Julien Girt MSN, [place], [place], [place], [place]

## 2017-05-15 NOTE — NC FL2 (Signed)
[place] MEDICAID FL2 LEVEL OF CARE SCREENING TOOL     IDENTIFICATION  Patient Name: [name] Birthdate: 06/08/1935 Sex: female Admission Date (Current Location): 05/10/2017  [facility] and [place] Number:  [employer] and Address:  [facility] [place] [place] [address], [place], [place] [address]      Provider Number: [id number]  Attending Physician Name and Address:  Tawni Millers  Relative Name and Phone Number:       Current Level of Care: Hospital Recommended Level of Care: [place] Prior Approval Number:    Date Approved/Denied:   PASRR Number: [id number]  Discharge Plan: SNF    Current Diagnoses: Patient Active Problem List   Diagnosis Date Noted  . Arterial hypotension   . Acute blood loss anemia 05/11/2017  . CKD (chronic kidney disease), stage III 05/10/2017  . Thigh hematoma, left, initial encounter 05/10/2017  . Leg hematoma, left, initial encounter 05/10/2017  . Chronic respiratory failure ([place]) 07/22/2016  . Chronic rhinitis 02/08/2016  . Chronic diastolic heart failure ([place]) 12/28/2015  . PAH (pulmonary artery hypertension) ([place])   . CHF (congestive heart failure) ([place]) 11/17/2015  . Acute congestive heart failure ([place]) 11/17/2015  . Pulmonary nodules 10/07/2013  . Encounter for therapeutic drug monitoring 09/22/2013  . DOE (dyspnea on exertion) 05/26/2012  . Psoriasis   . Carotid bruit   . Warfarin anticoagulation   . CAD (coronary artery disease)   . Pulmonary hypertension ([place]) 04/19/2011  . Atrial fibrillation ([place]) 11/16/2010  . GERD 04/17/2010  . COLONIC POLYPS, HX OF 04/17/2010  . Hyperlipidemia 04/17/2009  . Overweight(278.02) 04/17/2009  . Essential hypertension 04/17/2009    Orientation RESPIRATION BLADDER Height & Weight     Self, Time, Situation, Place  Normal Continent, External catheter Weight: 225 lb 5 oz (102.2 kg) Height:  5\' 5"  (165.1 cm)  BEHAVIORAL  SYMPTOMS/MOOD NEUROLOGICAL BOWEL NUTRITION STATUS   (None)  (None) Continent Diet (Heart healthy)  AMBULATORY STATUS COMMUNICATION OF NEEDS Skin   Limited Assist Verbally Skin abrasions, Bruising, Other (Comment) (Blister, MASD, Skin tear, Weeping.)                       Personal Care Assistance Level of Assistance  Bathing, Feeding, Dressing Bathing Assistance: Limited assistance Feeding assistance: Independent Dressing Assistance: Limited assistance     Functional Limitations Info  Sight, Hearing, Speech Sight Info: Adequate Hearing Info: Adequate Speech Info: Adequate    SPECIAL CARE FACTORS FREQUENCY  PT (By licensed PT), Blood pressure, OT (By licensed OT)     PT Frequency: 5 x week OT Frequency: 5 x week            Contractures Contractures Info: Not present    Additional Factors Info  Code Status, Allergies Code Status Info: Full Allergies Info: NKDA           Current Medications (05/15/2017):  This is the current hospital active medication list Current Facility-Administered Medications  Medication Dose Route Frequency Provider Last Rate Last Dose  . acetaminophen (TYLENOL) tablet 650 mg  650 mg Oral Q6H PRN Opyd, Ilene Qua, MD   650 mg at 05/12/17 0342   Or  . acetaminophen (TYLENOL) suppository 650 mg  650 mg Rectal Q6H PRN Opyd, Ilene Qua, MD      . ALPRAZolam Duanne Moron) tablet 0.5 mg  0.5 mg Oral QHS Opyd, Ilene Qua, MD   0.5 mg at 05/14/17 2125  . aluminum hydroxide-magnesium carbonate (GAVISCON)  95-358 MG/15ML suspension 15 mL  15 mL Oral TID PRN Opyd, Ilene Qua, MD      . atorvastatin (LIPITOR) tablet 40 mg  40 mg Oral q1800 Opyd, Ilene Qua, MD   40 mg at 05/14/17 1852  . bisacodyl (DULCOLAX) EC tablet 5 mg  5 mg Oral Daily PRN Opyd, Ilene Qua, MD   5 mg at 05/13/17 2000  . brimonidine (ALPHAGAN) 0.2 % ophthalmic solution 1 drop  1 drop Both Eyes BID Opyd, Ilene Qua, MD   1 drop at 05/15/17 1024  . brinzolamide (AZOPT) 1 % ophthalmic suspension 1  drop  1 drop Both Eyes BID Janece Canterbury, MD   1 drop at 05/15/17 1024  . fluticasone (FLONASE) 50 MCG/ACT nasal spray 2 spray  2 spray Each Nare Daily Opyd, Ilene Qua, MD   2 spray at 05/15/17 1025  . HYDROcodone-acetaminophen (NORCO/VICODIN) 5-325 MG per tablet 1 tablet  1 tablet Oral Q6H PRN Opyd, Ilene Qua, MD   1 tablet at 05/15/17 0526  . ketoconazole (NIZORAL) 2 % cream   Topical BID PRN Blount, Scarlette Shorts T, NP      . latanoprost (XALATAN) 0.005 % ophthalmic solution 1 drop  1 drop Right Eye QHS Opyd, Ilene Qua, MD   1 drop at 05/14/17 2126  . morphine 4 MG/ML injection 2 mg  2 mg Intravenous Q3H PRN Opyd, Ilene Qua, MD   2 mg at 05/11/17 0526  . ondansetron (ZOFRAN) tablet 4 mg  4 mg Oral Q6H PRN Opyd, Ilene Qua, MD       Or  . ondansetron (ZOFRAN) injection 4 mg  4 mg Intravenous Q6H PRN Opyd, Ilene Qua, MD      . pantoprazole (PROTONIX) EC tablet 40 mg  40 mg Oral Daily Opyd, Ilene Qua, MD   40 mg at 05/15/17 1023  . polyethylene glycol (MIRALAX / GLYCOLAX) packet 17 g  17 g Oral Daily Fransico Meadow, PA-C   17 g at 05/15/17 1024  . senna-docusate (Senokot-S) tablet 1 tablet  1 tablet Oral QHS PRN Opyd, Ilene Qua, MD   1 tablet at 05/13/17 2303  . sodium chloride 0.9 % bolus 1,000 mL  1,000 mL Intravenous Q1H PRN Short, Mackenzie, MD      . timolol (TIMOPTIC) 0.5 % ophthalmic solution 1 drop  1 drop Both Eyes BID Opyd, Ilene Qua, MD   1 drop at 05/15/17 1025     Discharge Medications: Please see discharge summary for a list of discharge medications.  Relevant Imaging Results:  Relevant Lab Results:   Additional Information SS#: [id number]  Candie Chroman, LCSW

## 2017-05-15 NOTE — Evaluation (Signed)
Physical Therapy Evaluation Patient Details Name: [name] MRN: [id number] DOB: September 18, 1934 Today's Date: 05/15/2017   History of Present Illness  82 y.o. female with medical history significant for atrial fibrillation on Eliquis coronary artery disease, pulmonary hypertension, chronic kidney disease stage III, chronic diastolic CHF, and chronic anemia,  now presenting to the emergency department for evaluation of rapidly worsening swelling and discoloration to the left leg after a ground-level mechanical fall at home.   Clinical Impression  Pt presents requiring max A with mobility and transfers and will continue to benefit from skilled PT services to address deficits and improve functional mobility. Pt and PT agree on recommendation for SNF after acute hospital stay.    Follow Up Recommendations SNF    Equipment Recommendations  None recommended by PT    Recommendations for Other Services       Precautions / Restrictions Precautions Precautions: Fall Restrictions Weight Bearing Restrictions: No      Mobility  Bed Mobility Overal bed mobility: Needs Assistance Bed Mobility: Supine to Sit;Sit to Supine     Supine to sit: Max assist Sit to supine: Max assist   General bed mobility comments: assist for trunk and bilat LEs with all bed mobility  Transfers Overall transfer level: Needs assistance Equipment used: Rolling walker (2 wheeled) Transfers: Sit to/from Stand Sit to Stand: Max assist         General transfer comment: standing from bed in elevated posistion with max A. pt with unable to come to full trunk/hip extension in standing, relies on UEs due to Lt LE pain with standing  Ambulation/Gait Ambulation/Gait assistance: Min assist Ambulation Distance (Feet): 3 Feet Assistive device: Rolling walker (2 wheeled)       General Gait Details: pt able to sidestep towards head of bed with min A to move RW and cues for posture and encouragement. pt requires  increased time and standing rest breaks  Stairs            Wheelchair Mobility    Modified Rankin (Stroke Patients Only)       Balance Overall balance assessment: Needs assistance Sitting-balance support: Bilateral upper extremity supported         Standing balance-Leahy Scale: Poor Standing balance comment: requires UEs for support in standing                             Pertinent Vitals/Pain Pain Assessment: Faces Faces Pain Scale: Hurts even more Pain Location: Lt LE Pain Descriptors / Indicators: Grimacing Pain Intervention(s): Limited activity within patient's tolerance;Monitored during session    Home Living Family/patient expects to be discharged to:: Skilled nursing facility                      Prior Function Level of Independence: Independent with assistive device(s)         Comments: used rollator at home     Hand Dominance        Extremity/Trunk Assessment   Upper Extremity Assessment Upper Extremity Assessment: Generalized weakness    Lower Extremity Assessment Lower Extremity Assessment: Generalized weakness    Cervical / Trunk Assessment Cervical / Trunk Assessment: Normal  Communication   Communication: No difficulties  Cognition Arousal/Alertness: Awake/alert Behavior During Therapy: WFL for tasks assessed/performed Overall Cognitive Status: Within Functional Limits for tasks assessed  General Comments General comments (skin integrity, edema, etc.): large wound Lt shin    Exercises     Assessment/Plan    PT Assessment Patient needs continued PT services  PT Problem List Pain;Decreased knowledge of use of DME;Decreased balance;Decreased strength;Decreased activity tolerance;Decreased mobility       PT Treatment Interventions Gait training;DME instruction;Therapeutic activities;Stair training;Therapeutic exercise;Neuromuscular  re-education;Functional mobility training;Patient/family education;Wheelchair mobility training;Balance training;Modalities;Manual techniques    PT Goals (Current goals can be found in the Care Plan section)  Acute Rehab PT Goals Patient Stated Goal: go to rehab and get stronger PT Goal Formulation: With patient Time For Goal Achievement: 05/29/17 Potential to Achieve Goals: Good    Frequency Min 3X/week   Barriers to discharge        Co-evaluation               AM-PAC PT "6 Clicks" Daily Activity  Outcome Measure Difficulty turning over in bed (including adjusting bedclothes, sheets and blankets)?: A Lot Difficulty moving from lying on back to sitting on the side of the bed? : A Lot Difficulty sitting down on and standing up from a chair with arms (e.g., wheelchair, bedside commode, etc,.)?: A Lot Help needed moving to and from a bed to chair (including a wheelchair)?: A Lot Help needed walking in hospital room?: Total Help needed climbing 3-5 steps with a railing? : Total 6 Click Score: 10    End of Session Equipment Utilized During Treatment: Gait belt Activity Tolerance: Patient limited by pain Patient left: in bed;with call bell/phone within reach Nurse Communication: Mobility status PT Visit Diagnosis: Muscle weakness (generalized) (M62.81);Difficulty in walking, not elsewhere classified (R26.2);Other abnormalities of gait and mobility (R26.89);Pain Pain - Right/Left: Left Pain - part of body: Leg    Time: 3846-6599 PT Time Calculation (min) (ACUTE ONLY): 28 min   Charges:   PT Evaluation $PT Eval Moderate Complexity: 1 Mod PT Treatments $Therapeutic Activity: 8-22 mins   PT G Codes:        Isabelle Course, PT, DPT  Hien Perreira 05/15/2017, 10:19 AM

## 2017-05-15 NOTE — Evaluation (Signed)
Occupational Therapy Evaluation Patient Details Name: [name] MRN: [id number] DOB: 09-Dec-1934 Today's Date: 05/15/2017    History of Present Illness 82 y.o. female with medical history significant for atrial fibrillation on Eliquis coronary artery disease, pulmonary hypertension, chronic kidney disease stage III, chronic diastolic CHF, and chronic anemia,  now presenting to the emergency department for evaluation of rapidly worsening swelling and discoloration to the left leg after a ground-level mechanical fall at home.    Clinical Impression   Pt currently requires max assist for transition from supine to sit and sit to supine.  Max assist for LB selfcare sit to stand and for stand pivot transfers with use of the RW.  Increased pain in the LLE with all functional mobility as well.  Feel pt will benefit from acute care OT services to increase overall independence for return home.  Do recommend longer rehab at SNF level based on the amount of assistance pt requires at this time.      Follow Up Recommendations  SNF    Equipment Recommendations  Other (comment) (TBD next venue of care)       Precautions / Restrictions Precautions Precautions: Fall Restrictions Weight Bearing Restrictions: No      Mobility Bed Mobility Overal bed mobility: Needs Assistance Bed Mobility: Supine to Sit;Sit to Supine     Supine to sit: Max assist Sit to supine: Max assist   General bed mobility comments: assist for trunk and bilat LEs with all bed mobility  Transfers Overall transfer level: Needs assistance Equipment used: Rolling walker (2 wheeled) Transfers: Sit to/from Stand Sit to Stand: Max assist Stand pivot transfers: Max assist       General transfer comment: She needs use of UE support for stand pivot transfers.  Decreased ability to weight shift and advance LEs.    Balance Overall balance assessment: Needs assistance Sitting-balance support: Bilateral upper extremity  supported Sitting balance-Leahy Scale: Fair     Standing balance support: Bilateral upper extremity supported Standing balance-Leahy Scale: Poor Standing balance comment: Pt required max assist for standing balance with BUE support.                           ADL either performed or assessed with clinical judgement   ADL Overall ADL's : Needs assistance/impaired Eating/Feeding: Independent   Grooming: Wash/dry hands;Wash/dry face;Sitting   Upper Body Bathing: Supervision/ safety;Sitting               Toilet Transfer: Maximal assistance;Stand-pivot;BSC   Toileting- Clothing Manipulation and Hygiene: Sit to/from stand;Maximal assistance       Functional mobility during ADLs: Maximal assistance;Rolling walker General ADL Comments: Pt needed max assist with max demonstrational cueing to sequence stand pivot transfer with use of the RW.  Increased trunk flexion noted in standing with pt leaning over the walker.  Decreased ability to move LEs during stepping for transfer from bed to 3:1 and back to bed.      Vision Baseline Vision/History: Wears glasses Wears Glasses: At all times Patient Visual Report: No change from baseline Vision Assessment?: No apparent visual deficits            Pertinent Vitals/Pain Pain Assessment: 0-10 Faces Pain Scale: No hurt Pain Location: left leg     Hand Dominance Right   Extremity/Trunk Assessment Upper Extremity Assessment Upper Extremity Assessment: Generalized weakness   Lower Extremity Assessment Lower Extremity Assessment: Defer to PT evaluation   Cervical / Trunk Assessment  Cervical / Trunk Assessment: Normal   Communication Communication Communication: No difficulties   Cognition Arousal/Alertness: Awake/alert Behavior During Therapy: WFL for tasks assessed/performed Overall Cognitive Status: Within Functional Limits for tasks assessed                                                 Home Living Family/patient expects to be discharged to:: Skilled nursing facility                                        Prior Functioning/Environment Level of Independence: Independent with assistive device(s)        Comments: used rollator at home        OT Problem List: Decreased strength;Impaired balance (sitting and/or standing);Pain;Decreased knowledge of use of DME or AE      OT Treatment/Interventions: Self-care/ADL training;DME and/or AE instruction;Balance training;Patient/family education    OT Goals(Current goals can be found in the care plan section) Acute Rehab OT Goals Patient Stated Goal: Pt did not state but agreeable to therapy. OT Goal Formulation: With patient Time For Goal Achievement: 05/29/17 Potential to Achieve Goals: Good  OT Frequency: Min 2X/week              AM-PAC PT "6 Clicks" Daily Activity     Outcome Measure Help from another person eating meals?: None Help from another person taking care of personal grooming?: A Little Help from another person toileting, which includes using toliet, bedpan, or urinal?: A Lot Help from another person bathing (including washing, rinsing, drying)?: A Lot Help from another person to put on and taking off regular upper body clothing?: A Little Help from another person to put on and taking off regular lower body clothing?: A Lot 6 Click Score: 16   End of Session Equipment Utilized During Treatment: Rolling walker Nurse Communication: Mobility status  Activity Tolerance: Patient tolerated treatment well Patient left: in bed;with call bell/phone within reach  OT Visit Diagnosis: Unsteadiness on feet (R26.81);Muscle weakness (generalized) (M62.81);History of falling (Z91.81);Pain Pain - part of body: Leg                Time: 1456-1535 OT Time Calculation (min): 39 min Charges:  OT General Charges $OT Visit: 1 Visit OT Evaluation $OT Eval Moderate Complexity: 1 Mod OT  Treatments $Self Care/Home Management : 23-37 mins   Elohim Brune OTR/L 05/15/2017, 3:53 PM

## 2017-05-15 NOTE — Clinical Social Work Placement (Signed)
   CLINICAL SOCIAL WORK PLACEMENT  NOTE  Date:  05/15/2017  Patient Details  Name: [name] MRN: [id number] Date of Birth: [birth year]  Clinical Social Work is seeking post-discharge placement for this patient at the [place] level of care (*CSW will initial, date and re-position this form in  chart as items are completed):  Yes   Patient/family provided with [place] Work Department's list of facilities offering this level of care within the geographic area requested by the patient (or if unable, by the patient's family).  Yes   Patient/family informed of their freedom to choose among providers that offer the needed level of care, that participate in Medicare, Medicaid or managed care program needed by the patient, have an available bed and are willing to accept the patient.  Yes   Patient/family informed of [place]'s ownership interest in [facility] and [facility], as well as of the fact that they are under no obligation to receive care at these facilities.  PASRR submitted to EDS on 05/15/17     PASRR number received on 05/15/17     Existing PASRR number confirmed on       FL2 transmitted to all facilities in geographic area requested by pt/family on 05/15/17     FL2 transmitted to all facilities within larger geographic area on       Patient informed that his/her managed care company has contracts with or will negotiate with certain facilities, including the following:            Patient/family informed of bed offers received.  Patient chooses bed at       Physician recommends and patient chooses bed at      Patient to be transferred to   on  .  Patient to be transferred to facility by       Patient family notified on   of transfer.  Name of family member notified:        PHYSICIAN Please sign FL2     Additional Comment:    _______________________________________________ Candie Chroman, LCSW 05/15/2017,  2:58 PM

## 2017-05-15 NOTE — Progress Notes (Signed)
PROGRESS NOTE    [name]  XIH:[id number] DOB: 11/24/1934 DOA: 05/10/2017 PCP: Redmond School, MD    Brief Narrative:  82 year old female who presented with left lower extremity ecchymosis and edema after a mechanical fall. Patient is known to have atrial fibrillation on anticoagulation, coronary artery disease, pulmonary hypertension, chronic kidney disease stage III, diastolic heart failure and chronic anemia. She developed the rapid progress of edema and discoloration of her left leg after sustaining a fall from her own height, no associated lightheadedness, chest pain or dyspnea. On initial physical examination blood pressure 116/67, heart rate 91, respiratory 18, temperature 97.6, oxygen saturations percent 93%. Moist mucous membranes, lungs were clear to auscultation bilaterally, no wheezing, rales or rhonchi, heart S1-S2 present irregularly irregular, no gallops, rubs or murmurs, gallop was soft nontender, large hematoma centered medial left knee with rupture bullae.   Patient was admitted to the hospital left leg traumatic hematoma.    Assessment & Plan:   Principal Problem:   Thigh hematoma, left, initial encounter Active Problems:   Essential hypertension   Atrial fibrillation (HCC)   CAD (coronary artery disease)   PAH (pulmonary artery hypertension) (HCC)   Chronic diastolic heart failure (HCC)   Chronic respiratory failure (HCC)   CKD (chronic kidney disease), stage III   Leg hematoma, left, initial encounter   Acute blood loss anemia   Arterial hypotension   1. Left traumatic leg hematoma. Large hematoma on left lower extremity, hb and hct have been stable, will continue to hold on anticoagulation, will continue pain control and physical therapy evaluation.   2. Atrial fibrillation. Rate controlled, will continue telemetry monitoring.   3. Chronic stable heart failure. Clinically euvolemic, holding losartan, spironolactone and torsemide, old records  personally reviewed echocardiogram from 34.9179 with LV systolic function 55 to 15%  4. Coronary artery disease. Patient is chest pain free. Continue atorvastatin.   5. CKD stage 3. Renal function stable, serum cr at 1,03 with K at 4,8 and serum bicarbonate at 25.   6. Anemia and thrombocytopenia. Hb and hct stable/ 8.0 and 26 with no signs of bleeding.     DVT prophylaxis: no anticoagulation   Code Status: full Family Communication:  Disposition Plan: snf   Consultants:     Procedures:     Antimicrobials:       Subjective: Patient feeling very weak and deconditioned, no nausea or vomiting, continue to have right lower extremity pain.   Objective: Vitals:   05/14/17 1956 05/15/17 0021 05/15/17 0537 05/15/17 1104  BP: (!) 104/41 (!) 102/55 (!) 110/59 (!) 98/41  Pulse: 88 86 86 82  Resp: 18 18 18 18   Temp: 98.9 F (37.2 C)  98 F (36.7 C) 98 F (36.7 C)  TempSrc: Oral  Oral Oral  SpO2: 96% 98% 98% 98%  Weight:   102.2 kg (225 lb 5 oz)   Height:        Intake/Output Summary (Last 24 hours) at 05/15/17 1315 Last data filed at 05/15/17 1010  Gross per 24 hour  Intake              720 ml  Output              950 ml  Net             -230 ml   Filed Weights   05/13/17 0500 05/14/17 0622 05/15/17 0537  Weight: 100.7 kg (222 lb) 103.6 kg (228 lb 4.8 oz) 102.2 kg (225 lb 5  oz)    Examination:  General: Not in pain or dyspnea, dconditioned Neurology: Awake and alert, non focal  E ENT: mild pallor, no icterus, oral mucosa moist Cardiovascular: No JVD. S1-S2 present, rhythmic, no gallops, rubs, or murmurs. No lower extremity edema. Pulmonary: vesicular breath sounds bilaterally, adequate air movement, no wheezing, rhonchi or rales. Gastrointestinal. Abdomen flat, no organomegaly, non tender, no rebound or guarding Skin. No rashes Musculoskeletal: edema and ecchymosis to right lower extremity, dressing in place, with decreased range of motion due to pain.       Data Reviewed: I have personally reviewed following labs and imaging studies  CBC:  Recent Labs Lab 05/10/17 1857  05/12/17 0254 05/13/17 0211 05/13/17 1509 05/14/17 0553 05/15/17 1049  WBC 8.8  < > 5.5 5.8 5.9 6.7 8.4  NEUTROABS 7.2  --   --   --   --   --   --   HGB 9.4*  < > 8.3* 7.7* 7.9* 7.9* 8.0*  HCT 30.8*  < > 26.4* 24.7* 25.5* 25.6* 26.3*  MCV 93.9  < > 92.3 94.3 95.5 95.9 96.0  PLT 129*  < > 91* 88* 89* 94* 126*  < > = values in this interval not displayed. Basic Metabolic Panel:  Recent Labs Lab 05/11/17 0534 05/12/17 0254 05/13/17 0211 05/14/17 0553 05/15/17 1049  NA 138 139 142 143 137  K 4.7 4.3 4.4 4.4 4.8  CL 104 110 113* 111 106  CO2 24 24 26 27 25   GLUCOSE 98 96 99 99 151*  BUN 72* 56* 31* 17 23*  CREATININE 1.59* 1.28* 0.88 0.81 1.03*  CALCIUM 9.1 8.6* 8.7* 9.0 9.1   GFR: Estimated Creatinine Clearance: 51.6 mL/min (A) (by C-G formula based on SCr of 1.03 mg/dL (H)). Liver Function Tests: No results for input(s): AST, ALT, ALKPHOS, BILITOT, PROT, ALBUMIN in the last 168 hours. No results for input(s): LIPASE, AMYLASE in the last 168 hours. No results for input(s): AMMONIA in the last 168 hours. Coagulation Profile: No results for input(s): INR, PROTIME in the last 168 hours. Cardiac Enzymes:  Recent Labs Lab 05/11/17 1557 05/11/17 1840 05/12/17 0254  TROPONINI 0.05* 0.07* 0.06*   BNP (last 3 results) No results for input(s): PROBNP in the last 8760 hours. HbA1C: No results for input(s): HGBA1C in the last 72 hours. CBG: No results for input(s): GLUCAP in the last 168 hours. Lipid Profile: No results for input(s): CHOL, HDL, LDLCALC, TRIG, CHOLHDL, LDLDIRECT in the last 72 hours. Thyroid Function Tests: No results for input(s): TSH, T4TOTAL, FREET4, T3FREE, THYROIDAB in the last 72 hours. Anemia Panel: No results for input(s): VITAMINB12, FOLATE, FERRITIN, TIBC, IRON, RETICCTPCT in the last 72 hours.    Radiology  Studies: I have reviewed all of the imaging during this hospital visit personally     Scheduled Meds: . ALPRAZolam  0.5 mg Oral QHS  . atorvastatin  40 mg Oral q1800  . brimonidine  1 drop Both Eyes BID  . brinzolamide  1 drop Both Eyes BID  . fluticasone  2 spray Each Nare Daily  . latanoprost  1 drop Right Eye QHS  . pantoprazole  40 mg Oral Daily  . polyethylene glycol  17 g Oral Daily  . timolol  1 drop Both Eyes BID   Continuous Infusions: . sodium chloride       LOS: 4 days        Brianna Danielson Gerome Apley, MD [facility] Pager [phone]

## 2017-05-15 NOTE — Care Management Important Message (Signed)
Important Message  Patient Details  Name: [name] MRN: [id number] Date of Birth: [birth year]   Medicare Important Message Given:  Yes    Roneshia Drew Montine Circle 05/15/2017, 2:01 PM

## 2017-05-16 ENCOUNTER — Other Ambulatory Visit: Payer: Self-pay | Admitting: *Deleted

## 2017-05-16 DIAGNOSIS — N183 Chronic kidney disease, stage 3 (moderate): Secondary | ICD-10-CM | POA: Diagnosis not present

## 2017-05-16 DIAGNOSIS — D689 Coagulation defect, unspecified: Secondary | ICD-10-CM | POA: Diagnosis not present

## 2017-05-16 DIAGNOSIS — S7012XD Contusion of left thigh, subsequent encounter: Secondary | ICD-10-CM | POA: Diagnosis not present

## 2017-05-16 DIAGNOSIS — K263 Acute duodenal ulcer without hemorrhage or perforation: Secondary | ICD-10-CM | POA: Diagnosis not present

## 2017-05-16 DIAGNOSIS — I509 Heart failure, unspecified: Secondary | ICD-10-CM | POA: Diagnosis not present

## 2017-05-16 DIAGNOSIS — L97223 Non-pressure chronic ulcer of left calf with necrosis of muscle: Secondary | ICD-10-CM | POA: Diagnosis present

## 2017-05-16 DIAGNOSIS — I10 Essential (primary) hypertension: Secondary | ICD-10-CM | POA: Diagnosis not present

## 2017-05-16 DIAGNOSIS — D696 Thrombocytopenia, unspecified: Secondary | ICD-10-CM | POA: Diagnosis not present

## 2017-05-16 DIAGNOSIS — S7012XA Contusion of left thigh, initial encounter: Secondary | ICD-10-CM | POA: Diagnosis not present

## 2017-05-16 DIAGNOSIS — E874 Mixed disorder of acid-base balance: Secondary | ICD-10-CM | POA: Diagnosis not present

## 2017-05-16 DIAGNOSIS — S8012XS Contusion of left lower leg, sequela: Secondary | ICD-10-CM | POA: Diagnosis not present

## 2017-05-16 DIAGNOSIS — Z710 Person encountering health services to consult on behalf of another person: Secondary | ICD-10-CM | POA: Diagnosis not present

## 2017-05-16 DIAGNOSIS — K266 Chronic or unspecified duodenal ulcer with both hemorrhage and perforation: Secondary | ICD-10-CM | POA: Diagnosis not present

## 2017-05-16 DIAGNOSIS — R061 Stridor: Secondary | ICD-10-CM | POA: Diagnosis not present

## 2017-05-16 DIAGNOSIS — R578 Other shock: Secondary | ICD-10-CM | POA: Diagnosis not present

## 2017-05-16 DIAGNOSIS — D62 Acute posthemorrhagic anemia: Secondary | ICD-10-CM | POA: Diagnosis not present

## 2017-05-16 DIAGNOSIS — M726 Necrotizing fasciitis: Secondary | ICD-10-CM | POA: Diagnosis not present

## 2017-05-16 DIAGNOSIS — K269 Duodenal ulcer, unspecified as acute or chronic, without hemorrhage or perforation: Secondary | ICD-10-CM | POA: Diagnosis not present

## 2017-05-16 DIAGNOSIS — I270 Primary pulmonary hypertension: Secondary | ICD-10-CM | POA: Diagnosis not present

## 2017-05-16 DIAGNOSIS — J9621 Acute and chronic respiratory failure with hypoxia: Secondary | ICD-10-CM | POA: Diagnosis not present

## 2017-05-16 DIAGNOSIS — R001 Bradycardia, unspecified: Secondary | ICD-10-CM | POA: Diagnosis not present

## 2017-05-16 DIAGNOSIS — J9690 Respiratory failure, unspecified, unspecified whether with hypoxia or hypercapnia: Secondary | ICD-10-CM | POA: Diagnosis not present

## 2017-05-16 DIAGNOSIS — I480 Paroxysmal atrial fibrillation: Secondary | ICD-10-CM | POA: Diagnosis not present

## 2017-05-16 DIAGNOSIS — I110 Hypertensive heart disease with heart failure: Secondary | ICD-10-CM | POA: Diagnosis not present

## 2017-05-16 DIAGNOSIS — I2510 Atherosclerotic heart disease of native coronary artery without angina pectoris: Secondary | ICD-10-CM | POA: Diagnosis not present

## 2017-05-16 DIAGNOSIS — I2721 Secondary pulmonary arterial hypertension: Secondary | ICD-10-CM | POA: Diagnosis not present

## 2017-05-16 DIAGNOSIS — L97123 Non-pressure chronic ulcer of left thigh with necrosis of muscle: Secondary | ICD-10-CM | POA: Diagnosis present

## 2017-05-16 DIAGNOSIS — I959 Hypotension, unspecified: Secondary | ICD-10-CM | POA: Diagnosis not present

## 2017-05-16 DIAGNOSIS — E162 Hypoglycemia, unspecified: Secondary | ICD-10-CM | POA: Diagnosis not present

## 2017-05-16 DIAGNOSIS — J9601 Acute respiratory failure with hypoxia: Secondary | ICD-10-CM | POA: Diagnosis not present

## 2017-05-16 DIAGNOSIS — S8990XA Unspecified injury of unspecified lower leg, initial encounter: Secondary | ICD-10-CM | POA: Diagnosis not present

## 2017-05-16 DIAGNOSIS — Z515 Encounter for palliative care: Secondary | ICD-10-CM | POA: Diagnosis not present

## 2017-05-16 DIAGNOSIS — Z7189 Other specified counseling: Secondary | ICD-10-CM | POA: Diagnosis not present

## 2017-05-16 DIAGNOSIS — I70262 Atherosclerosis of native arteries of extremities with gangrene, left leg: Secondary | ICD-10-CM | POA: Diagnosis not present

## 2017-05-16 DIAGNOSIS — E876 Hypokalemia: Secondary | ICD-10-CM | POA: Diagnosis not present

## 2017-05-16 DIAGNOSIS — I5033 Acute on chronic diastolic (congestive) heart failure: Secondary | ICD-10-CM | POA: Diagnosis not present

## 2017-05-16 DIAGNOSIS — T4145XA Adverse effect of unspecified anesthetic, initial encounter: Secondary | ICD-10-CM | POA: Diagnosis not present

## 2017-05-16 DIAGNOSIS — I272 Pulmonary hypertension, unspecified: Secondary | ICD-10-CM | POA: Diagnosis not present

## 2017-05-16 DIAGNOSIS — I130 Hypertensive heart and chronic kidney disease with heart failure and stage 1 through stage 4 chronic kidney disease, or unspecified chronic kidney disease: Secondary | ICD-10-CM | POA: Diagnosis not present

## 2017-05-16 DIAGNOSIS — D473 Essential (hemorrhagic) thrombocythemia: Secondary | ICD-10-CM | POA: Diagnosis not present

## 2017-05-16 DIAGNOSIS — R0989 Other specified symptoms and signs involving the circulatory and respiratory systems: Secondary | ICD-10-CM | POA: Diagnosis not present

## 2017-05-16 DIAGNOSIS — I248 Other forms of acute ischemic heart disease: Secondary | ICD-10-CM | POA: Diagnosis not present

## 2017-05-16 DIAGNOSIS — R404 Transient alteration of awareness: Secondary | ICD-10-CM | POA: Diagnosis not present

## 2017-05-16 DIAGNOSIS — S8012XA Contusion of left lower leg, initial encounter: Secondary | ICD-10-CM | POA: Diagnosis not present

## 2017-05-16 DIAGNOSIS — W19XXXD Unspecified fall, subsequent encounter: Secondary | ICD-10-CM | POA: Diagnosis present

## 2017-05-16 DIAGNOSIS — N170 Acute kidney failure with tubular necrosis: Secondary | ICD-10-CM | POA: Diagnosis not present

## 2017-05-16 DIAGNOSIS — I482 Chronic atrial fibrillation: Secondary | ICD-10-CM | POA: Diagnosis not present

## 2017-05-16 DIAGNOSIS — I621 Nontraumatic extradural hemorrhage: Secondary | ICD-10-CM | POA: Diagnosis not present

## 2017-05-16 DIAGNOSIS — N179 Acute kidney failure, unspecified: Secondary | ICD-10-CM | POA: Diagnosis not present

## 2017-05-16 DIAGNOSIS — D600 Chronic acquired pure red cell aplasia: Secondary | ICD-10-CM | POA: Diagnosis not present

## 2017-05-16 DIAGNOSIS — K922 Gastrointestinal hemorrhage, unspecified: Secondary | ICD-10-CM | POA: Diagnosis not present

## 2017-05-16 DIAGNOSIS — I5032 Chronic diastolic (congestive) heart failure: Secondary | ICD-10-CM | POA: Diagnosis not present

## 2017-05-16 DIAGNOSIS — J9600 Acute respiratory failure, unspecified whether with hypoxia or hypercapnia: Secondary | ICD-10-CM | POA: Diagnosis not present

## 2017-05-16 DIAGNOSIS — I9589 Other hypotension: Secondary | ICD-10-CM | POA: Diagnosis not present

## 2017-05-16 DIAGNOSIS — D649 Anemia, unspecified: Secondary | ICD-10-CM | POA: Diagnosis not present

## 2017-05-16 DIAGNOSIS — D708 Other neutropenia: Secondary | ICD-10-CM | POA: Diagnosis not present

## 2017-05-16 DIAGNOSIS — I361 Nonrheumatic tricuspid (valve) insufficiency: Secondary | ICD-10-CM | POA: Diagnosis not present

## 2017-05-16 DIAGNOSIS — T148XXA Other injury of unspecified body region, initial encounter: Secondary | ICD-10-CM | POA: Diagnosis not present

## 2017-05-16 DIAGNOSIS — S8012XD Contusion of left lower leg, subsequent encounter: Secondary | ICD-10-CM | POA: Diagnosis not present

## 2017-05-16 DIAGNOSIS — Z66 Do not resuscitate: Secondary | ICD-10-CM | POA: Diagnosis not present

## 2017-05-16 DIAGNOSIS — K7200 Acute and subacute hepatic failure without coma: Secondary | ICD-10-CM | POA: Diagnosis not present

## 2017-05-16 DIAGNOSIS — I4891 Unspecified atrial fibrillation: Secondary | ICD-10-CM | POA: Diagnosis not present

## 2017-05-16 DIAGNOSIS — R748 Abnormal levels of other serum enzymes: Secondary | ICD-10-CM | POA: Diagnosis not present

## 2017-05-16 LAB — HEMOGLOBIN AND HEMATOCRIT, BLOOD
HEMATOCRIT: 25.9 % — AB (ref 36.0–46.0)
HEMOGLOBIN: 8 g/dL — AB (ref 12.0–15.0)

## 2017-05-16 MED ORDER — HYDROCODONE-ACETAMINOPHEN 5-325 MG PO TABS
1.0000 | ORAL_TABLET | Freq: Three times a day (TID) | ORAL | 0 refills | Status: AC | PRN
Start: 2017-05-16 — End: ?

## 2017-05-16 MED ORDER — AMBRISENTAN 5 MG PO TABS: 5.0000 mg | ORAL_TABLET | Freq: Every day | ORAL | Status: DC

## 2017-05-16 MED ORDER — ALPRAZOLAM 0.5 MG PO TABS: 0.5000 mg | ORAL_TABLET | Freq: Every day | ORAL | 0 refills | Status: AC

## 2017-05-16 NOTE — Addendum Note (Signed)
Addended by: Dickie La on: 05/16/2017 06:21 PM   Modules accepted: Orders

## 2017-05-16 NOTE — Clinical Social Work Placement (Signed)
   CLINICAL SOCIAL WORK PLACEMENT  NOTE  Date:  05/16/2017  Patient Details  Name: [name] MRN: [id number] Date of Birth: September 06, 1934  Clinical Social Work is seeking post-discharge placement for this patient at the [place] level of care (*CSW will initial, date and re-position this form in  chart as items are completed):  Yes   Patient/family provided with [place] Work Department's list of facilities offering this level of care within the geographic area requested by the patient (or if unable, by the patient's family).  Yes   Patient/family informed of their freedom to choose among providers that offer the needed level of care, that participate in Medicare, Medicaid or managed care program needed by the patient, have an available bed and are willing to accept the patient.  Yes   Patient/family informed of [place]'s ownership interest in [facility] and [facility], as well as of the fact that they are under no obligation to receive care at these facilities.  PASRR submitted to EDS on 05/15/17     PASRR number received on 05/15/17     Existing PASRR number confirmed on       FL2 transmitted to all facilities in geographic area requested by pt/family on 05/15/17     FL2 transmitted to all facilities within larger geographic area on       Patient informed that his/her managed care company has contracts with or will negotiate with certain facilities, including the following:        Yes   Patient/family informed of bed offers received.  Patient chooses bed at [facility]     Physician recommends and patient chooses bed at      Patient to be transferred to [facility] on 05/16/17.  Patient to be transferred to facility by [facility]     Patient family notified on 05/16/17 of transfer.  Name of family member notified:  Dante Gang     PHYSICIAN Please prepare prescriptions     Additional Comment:     _______________________________________________ Candie Chroman, LCSW 05/16/2017, 2:57 PM

## 2017-05-16 NOTE — Patient Outreach (Signed)
[facility]) Care Management  05/16/2017  [name] Nov 22, 1934 [id number]  Referral via [facility] Liaison for transition of care post jospital discharge:  Per chart review patient was discharged today & transferred to skilled nursing facility in [place].   Patient inappropriate for [facility] program at this time.   Plan:  Will send to care management assistant to advise clinical Social worker.   Sherrin Daisy, RN BSN [place] Management Coordinator [facility] Care Management  [phone]

## 2017-05-16 NOTE — Discharge Summary (Signed)
Physician Discharge Summary  [name] DQQ:[id number] DOB: [birth year] DOA: 05/10/2017  PCP: Redmond School, MD  Admit date: 05/10/2017 Discharge date: 05/16/2017  Admitted From: Home Disposition:  SNF  Recommendations for Outpatient Follow-up:  1. Follow up with PCP in 1- week 2. Apixaban has been discontinued due to bleeding, to reconsider resuming anticoagulation, once hematoma improves. 3. Wound care: Apply double folded xeroform left leg wounds, daily then covered with AVD pads and Ace wrap. 4. Holding antihypertensive medications and diuretic due to risk of hypotension     Home Health: NA Equipment/Devices: NA   Discharge Condition: Stable CODE STATUS: Full  Diet recommendation: Heart healthy  Brief/Interim Summary: 82 year old female who presented with left lower extremity ecchymosis and edema after a mechanical fall. Patient is known to have atrial fibrillation on anticoagulation, coronary artery disease, pulmonary hypertension, chronic kidney disease stage III, diastolic heart failure and chronic anemia. She developed a rapid, progressive edema and discoloration of her left leg after sustaining a fall from her own height, no associated lightheadedness, chest pain or dyspnea. On initial physical examination blood pressure 116/67, heart rate 91, respiratory 18, temperature 97.6, oxygen saturations percent 93%. Moist mucous membranes, lungs were clear to auscultation bilaterally, no wheezing, rales or rhonchi, heart S1-S2 present irregularly irregular, no gallops, rubs or murmurs, abdomen was soft nontender, large hematoma centered medial left knee with rupture bullae. Sodium 140, Potassium 4.5, chloride 105, bicarbonate 27, glucose 125, BUN 76, creatinine 1.69, white count 8.8, hemoglobin 9.4, hematocrit 30.8, platelets 129. Knee x-ray with soft tissue masses along the medial aspect of the left knee and medial aspect of the proximal lower leg, suggestive for hematoma (8.5 cm  oval soft tissue mass). EKG with atrial fibrillation, left axis deviation, poor R-wave progression   Patient was admitted to the hospital left leg traumatic hematoma.  1. Left traumatic leg hematoma complicated by hypovolemic shock and acute blood loss anemia plus consumptive thrombocytopenia. Patient was admitted to the medical floor, she was placed on remote telemetry monitor, anticoagulation was held. Patient developed hypotension with worsening anemia, hemoglobin down to 7.9, she received 2 units packed red blood cells. She underwent reversal of apixaban on 09/23. Her hemoglobin and hematocrit remained stable, she was seen by orthopedic surgery, no compartment syndrome was found. She was seen by wound care, recommendations to continue local wound care. Discharge hemoglobin 8.0 with hematocrit 25.9. Platelets up to 126.   2. Chronic and persistent atrial fibrillation. Rate remained well-controlled, anticoagulation was held. Will recommend to resume anticoagulation once hematoma has improved. Risk versus benefit, she does qualify for anticoagulation for stroke prevention.  3. Chronic stable diastolic heart failure. Heart failure remained stable, no acute decompensation, her diuretics and antihypertensives were held due to risk of hypotension. Discharge blood pressure 110/48.   4. Chronic kidney disease stage III. Renal function remained stable, with a serum creatinine 1.0-1.2. Diuretics were held. Discharge serum creatinine 1.03, potassium 4.8, serum bicarbonate 25.   Discharge Diagnoses:  Principal Problem:   Thigh hematoma, left, initial encounter Active Problems:   Essential hypertension   Atrial fibrillation (HCC)   CAD (coronary artery disease)   PAH (pulmonary artery hypertension) (HCC)   Chronic diastolic heart failure (HCC)   Chronic respiratory failure (HCC)   CKD (chronic kidney disease), stage III   Leg hematoma, left, initial encounter   Acute blood loss anemia   Arterial  hypotension    Discharge Instructions  Discharge Instructions    AMB Referral to [facility]  Complete by:  As directed    Reason for consult:  HF care management support   Diagnoses of:  Heart Failure   Expected date of contact:  1-3 days (reserved for hospital discharges)   Please assign this patient to RN Telephonic Care Coordinator for ST transition of care calls and follow up.  Please contact:  Natividad Brood, RN BSN [facility] Liaison  [phone] business mobile phone Toll free office [phone]     Allergies as of 05/16/2017   No Known Allergies     Medication List    STOP taking these medications   benzonatate 200 MG capsule Commonly known as:  TESSALON   ELIQUIS 5 MG Tabs tablet Generic drug:  apixaban   losartan 100 MG tablet Commonly known as:  COZAAR   ondansetron 8 MG disintegrating tablet Commonly known as:  ZOFRAN ODT   potassium chloride SA 20 MEQ tablet Commonly known as:  K-DUR,KLOR-CON   spironolactone 25 MG tablet Commonly known as:  ALDACTONE   torsemide 20 MG tablet Commonly known as:  DEMADEX   trolamine salicylate 10 % cream Commonly known as:  ASPERCREME     TAKE these medications   ALPRAZolam 0.5 MG tablet Commonly known as:  XANAX Take 1 tablet (0.5 mg total) by mouth at bedtime.   alum hydroxide-mag trisilicate 02-58 MG Chew chewable tablet Commonly known as:  GAVISCON Chew 1 tablet by mouth 3 (three) times daily as needed for indigestion or heartburn.   ambrisentan 5 MG tablet Commonly known as:  LETAIRIS Take 1 tablet (5 mg total) by mouth daily. What changed:  when to take this   atorvastatin 80 MG tablet Commonly known as:  LIPITOR TAKE 1/2 TABLET BY MOUTH DAILY What changed:  See the new instructions.   azelastine 0.1 % nasal spray Commonly known as:  ASTELIN Place 2 sprays into both nostrils 2 (two) times daily as needed (congestion).   brimonidine 0.2 % ophthalmic  solution Commonly known as:  ALPHAGAN Place 1 drop into the right eye 2 (two) times daily.   brinzolamide 1 % ophthalmic suspension Commonly known as:  AZOPT Place 1 drop into both eyes 2 (two) times daily.   HYDROcodone-acetaminophen 5-325 MG tablet Commonly known as:  NORCO/VICODIN Take 1 tablet by mouth 3 (three) times daily as needed for moderate pain.   latanoprost 0.005 % ophthalmic solution Commonly known as:  XALATAN Place 1 drop into the right eye at bedtime.   nitroGLYCERIN 0.4 MG SL tablet Commonly known as:  NITROSTAT Place 0.4 mg under the tongue every 5 (five) minutes as needed for chest pain (x 3 tablets daily). Reported on 12/28/2015   omeprazole 40 MG capsule Commonly known as:  PRILOSEC Take 40 mg by mouth daily.   OXYGEN Inhale 2.5 L into the lungs at bedtime.   PRESCRIPTION MEDICATION Apply 1 application topically See admin instructions. Ketoconazole/fluticasone compounded by [facility] - apply topically twice daily (under breasts, stomach folds and between legs) as needed for yeast infection   timolol 0.5 % ophthalmic solution Commonly known as:  TIMOPTIC Place 1 drop into both eyes 2 (two) times daily.            Discharge Care Instructions        Start     Ordered   05/16/17 0000  ALPRAZolam (XANAX) 0.5 MG tablet  Daily at bedtime     05/16/17 1038   05/16/17 0000  HYDROcodone-acetaminophen (NORCO/VICODIN) 5-325 MG tablet  3 times  daily PRN     05/16/17 1038   05/16/17 0000  Increase activity slowly     05/16/17 1038   05/16/17 0000  Diet - low sodium heart healthy     05/16/17 1038   05/16/17 0000  Discharge instructions    Comments:  Please follow with primary care in 7 days.   05/16/17 1038   05/14/17 0000  AMB Referral to [place] [facility]    Question Answer Comment  Reason for consult HF care management support   Diagnoses of Heart Failure   Expected date of contact 1-3 days (reserved for hospital discharges)      05/14/17 1147       No Known Allergies  Consultations:  Surgery Orthopedic  Wound care  Critical care   Procedures/Studies: Dg Tibia/fibula Left  Result Date: 05/10/2017 CLINICAL DATA:  Left knee pain after fall EXAM: LEFT KNEE - COMPLETE 4+ VIEW; LEFT TIBIA AND FIBULA - 2 VIEW COMPARISON:  Left knee radiographs 04/10/2008 FINDINGS: Left knee: Osteopenia limits the study. Moderate patellofemoral degenerative changes. No definite acute displaced fracture or malalignment. Chondrocalcinosis. Moderate-to-marked narrowing of the medial joint space compartment with bony spurring. Small moderate suprapatellar effusion. Masslike soft tissue opacities along the medial aspect of the left knee and the left proximal lower leg. Left tibia fibula: Osteopenia. Vascular calcifications. No acute fracture or malalignment. 8.5 cm oval soft tissue mass medial aspect of the proximal lower leg. IMPRESSION: 1. Osteopenia limits the study 2. No definite acute osseous abnormality at the left knee or left tibia fibula 3. Small to moderate suprapatellar effusion 4. Oval soft tissue masses along the medial aspect of the left knee and the medial aspect of the proximal lower leg, could represent large hematoma although other soft tissue mass not excluded, clinical correlation is recommended 5. Chondrocalcinosis with moderate to marked degenerative changes of the left knee Electronically Signed   By: Donavan Foil M.D.   On: 05/10/2017 18:47   Dg Knee Complete 4 Views Left  Result Date: 05/10/2017 CLINICAL DATA:  Left knee pain after fall EXAM: LEFT KNEE - COMPLETE 4+ VIEW; LEFT TIBIA AND FIBULA - 2 VIEW COMPARISON:  Left knee radiographs 04/10/2008 FINDINGS: Left knee: Osteopenia limits the study. Moderate patellofemoral degenerative changes. No definite acute displaced fracture or malalignment. Chondrocalcinosis. Moderate-to-marked narrowing of the medial joint space compartment with bony spurring. Small moderate suprapatellar effusion.  Masslike soft tissue opacities along the medial aspect of the left knee and the left proximal lower leg. Left tibia fibula: Osteopenia. Vascular calcifications. No acute fracture or malalignment. 8.5 cm oval soft tissue mass medial aspect of the proximal lower leg. IMPRESSION: 1. Osteopenia limits the study 2. No definite acute osseous abnormality at the left knee or left tibia fibula 3. Small to moderate suprapatellar effusion 4. Oval soft tissue masses along the medial aspect of the left knee and the medial aspect of the proximal lower leg, could represent large hematoma although other soft tissue mass not excluded, clinical correlation is recommended 5. Chondrocalcinosis with moderate to marked degenerative changes of the left knee Electronically Signed   By: Donavan Foil M.D.   On: 05/10/2017 18:47       Subjective: Patient feeling well, no nausea or vomiting, persistent right leg pain, m  Discharge Exam: Vitals:   05/15/17 2215 05/16/17 0336  BP: (!) 110/48 110/63  Pulse: 85 76  Resp: 18 18  Temp: (!) 97.4 F (36.3 C) 98.2 F (36.8 C)  SpO2: 100% 99%  Vitals:   05/15/17 1104 05/15/17 1540 05/15/17 2215 05/16/17 0336  BP: (!) 98/41  (!) 110/48 110/63  Pulse: 82 85 85 76  Resp: 18  18 18   Temp: 98 F (36.7 C)  (!) 97.4 F (36.3 C) 98.2 F (36.8 C)  TempSrc: Oral  Oral Oral  SpO2: 98% (!) 86% 100% 99%  Weight:    101.7 kg (224 lb 3.3 oz)  Height:        General: Pt is alert, awake, not in acute distress Cardiovascular: RRR, S1/S2 +, no rubs, no gallops Respiratory: CTA bilaterally, no wheezing, no rhonchi Abdominal: Soft, NT, ND, bowel sounds + Extremities: right leg with non pitting edema, dressing place, palpable pulses, no cyanosis    The results of significant diagnostics from this hospitalization (including imaging, microbiology, ancillary and laboratory) are listed below for reference.     Microbiology: Recent Results (from the past 240 hour(s))  MRSA PCR  Screening     Status: None   Collection Time: 05/13/17  6:57 AM  Result Value Ref Range Status   MRSA by PCR NEGATIVE NEGATIVE Final    Comment:        The GeneXpert MRSA Assay (FDA approved for NASAL specimens only), is one component of a comprehensive MRSA colonization surveillance program. It is not intended to diagnose MRSA infection nor to guide or monitor treatment for MRSA infections.      Labs: BNP (last 3 results) No results for input(s): BNP in the last 8760 hours. Basic Metabolic Panel:  Recent Labs Lab 05/11/17 0534 05/12/17 0254 05/13/17 0211 05/14/17 0553 05/15/17 1049  NA 138 139 142 143 137  K 4.7 4.3 4.4 4.4 4.8  CL 104 110 113* 111 106  CO2 24 24 26 27 25   GLUCOSE 98 96 99 99 151*  BUN 72* 56* 31* 17 23*  CREATININE 1.59* 1.28* 0.88 0.81 1.03*  CALCIUM 9.1 8.6* 8.7* 9.0 9.1   Liver Function Tests: No results for input(s): AST, ALT, ALKPHOS, BILITOT, PROT, ALBUMIN in the last 168 hours. No results for input(s): LIPASE, AMYLASE in the last 168 hours. No results for input(s): AMMONIA in the last 168 hours. CBC:  Recent Labs Lab 05/10/17 1857  05/12/17 0254 05/13/17 0211 05/13/17 1509 05/14/17 0553 05/15/17 1049 05/16/17 0505  WBC 8.8  < > 5.5 5.8 5.9 6.7 8.4  --   NEUTROABS 7.2  --   --   --   --   --   --   --   HGB 9.4*  < > 8.3* 7.7* 7.9* 7.9* 8.0* 8.0*  HCT 30.8*  < > 26.4* 24.7* 25.5* 25.6* 26.3* 25.9*  MCV 93.9  < > 92.3 94.3 95.5 95.9 96.0  --   PLT 129*  < > 91* 88* 89* 94* 126*  --   < > = values in this interval not displayed. Cardiac Enzymes:  Recent Labs Lab 05/11/17 1557 05/11/17 1840 05/12/17 0254  TROPONINI 0.05* 0.07* 0.06*   BNP: Invalid input(s): POCBNP CBG: No results for input(s): GLUCAP in the last 168 hours. D-Dimer No results for input(s): DDIMER in the last 72 hours. Hgb A1c No results for input(s): HGBA1C in the last 72 hours. Lipid Profile No results for input(s): CHOL, HDL, LDLCALC, TRIG, CHOLHDL,  LDLDIRECT in the last 72 hours. Thyroid function studies No results for input(s): TSH, T4TOTAL, T3FREE, THYROIDAB in the last 72 hours.  Invalid input(s): FREET3 Anemia work up No results for input(s): VITAMINB12, FOLATE, FERRITIN, TIBC, IRON,  RETICCTPCT in the last 72 hours. Urinalysis    Component Value Date/Time   COLORURINE YELLOW 02/10/2016 1503   APPEARANCEUR CLOUDY (A) 02/10/2016 1503   LABSPEC 1.014 02/10/2016 1503   PHURINE 5.5 02/10/2016 1503   GLUCOSEU NEGATIVE 02/10/2016 1503   HGBUR NEGATIVE 02/10/2016 1503   BILIRUBINUR NEGATIVE 02/10/2016 1503   KETONESUR NEGATIVE 02/10/2016 1503   PROTEINUR NEGATIVE 02/10/2016 1503   NITRITE NEGATIVE 02/10/2016 1503   LEUKOCYTESUR NEGATIVE 02/10/2016 1503   Sepsis Labs Invalid input(s): PROCALCITONIN,  WBC,  LACTICIDVEN Microbiology Recent Results (from the past 240 hour(s))  MRSA PCR Screening     Status: None   Collection Time: 05/13/17  6:57 AM  Result Value Ref Range Status   MRSA by PCR NEGATIVE NEGATIVE Final    Comment:        The GeneXpert MRSA Assay (FDA approved for NASAL specimens only), is one component of a comprehensive MRSA colonization surveillance program. It is not intended to diagnose MRSA infection nor to guide or monitor treatment for MRSA infections.      Time coordinating discharge: 45 minutes  SIGNED:   Tawni Millers, MD  [facility] 05/16/2017, 10:10 AM Pager [phone]  If 7PM-7AM, please contact night-coverage [email or web address] Password [email or web address]

## 2017-05-16 NOTE — Progress Notes (Addendum)
Pt is been discharged to the [facility] in [place], report was given to that facility nurse, pt was given her letaris, eye drops and flonase. IV and telemetry discontinued. Pt is stable and awaiting PTAR.
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
## 2017-05-16 NOTE — Clinical Social Work Note (Addendum)
CSW facilitated patient discharge including contacting patient family and facility to confirm patient discharge plans. Clinical information faxed to facility and family agreeable with plan. CSW arranged ambulance transport via [facility] to [facility]. RN to call report prior to discharge [phone] Room [phone]).  CSW will sign off for now as social work intervention is no longer needed. Please consult [place] again if new needs arise.  Dayton Scrape, [place]

## 2017-05-17 ENCOUNTER — Telehealth: Payer: Self-pay | Admitting: Pulmonary Disease

## 2017-05-17 NOTE — Telephone Encounter (Signed)
Patient's husband called concerned that her Letairis, Torsemide, and systemic anticoagulation had been stopped. Has transitioned out of hospital to what sounds like a rehab facility. Reassured patient's husband that it is ok to hold these medications short term. Recommended they could restart her Milda Smart first but would defer to the managing physician at her rehab facility. He acknowledged understanding.

## 2017-05-19 ENCOUNTER — Emergency Department (HOSPITAL_COMMUNITY): Payer: Medicare Other

## 2017-05-19 ENCOUNTER — Inpatient Hospital Stay (HOSPITAL_COMMUNITY)
Admission: EM | Admit: 2017-05-19 | Discharge: 2017-06-19 | DRG: 981 | Disposition: E | Payer: Medicare Other | Attending: Internal Medicine | Admitting: Internal Medicine

## 2017-05-19 ENCOUNTER — Encounter (HOSPITAL_COMMUNITY): Payer: Self-pay

## 2017-05-19 DIAGNOSIS — S8012XD Contusion of left lower leg, subsequent encounter: Secondary | ICD-10-CM | POA: Diagnosis not present

## 2017-05-19 DIAGNOSIS — K261 Acute duodenal ulcer with perforation: Secondary | ICD-10-CM | POA: Diagnosis not present

## 2017-05-19 DIAGNOSIS — I5033 Acute on chronic diastolic (congestive) heart failure: Secondary | ICD-10-CM | POA: Diagnosis not present

## 2017-05-19 DIAGNOSIS — R748 Abnormal levels of other serum enzymes: Secondary | ICD-10-CM | POA: Diagnosis not present

## 2017-05-19 DIAGNOSIS — R0602 Shortness of breath: Secondary | ICD-10-CM

## 2017-05-19 DIAGNOSIS — L97223 Non-pressure chronic ulcer of left calf with necrosis of muscle: Secondary | ICD-10-CM | POA: Diagnosis present

## 2017-05-19 DIAGNOSIS — D689 Coagulation defect, unspecified: Secondary | ICD-10-CM | POA: Diagnosis not present

## 2017-05-19 DIAGNOSIS — D473 Essential (hemorrhagic) thrombocythemia: Secondary | ICD-10-CM | POA: Diagnosis not present

## 2017-05-19 DIAGNOSIS — I110 Hypertensive heart disease with heart failure: Secondary | ICD-10-CM | POA: Diagnosis not present

## 2017-05-19 DIAGNOSIS — I9589 Other hypotension: Secondary | ICD-10-CM

## 2017-05-19 DIAGNOSIS — I480 Paroxysmal atrial fibrillation: Secondary | ICD-10-CM | POA: Diagnosis present

## 2017-05-19 DIAGNOSIS — R001 Bradycardia, unspecified: Secondary | ICD-10-CM | POA: Diagnosis not present

## 2017-05-19 DIAGNOSIS — R0902 Hypoxemia: Secondary | ICD-10-CM

## 2017-05-19 DIAGNOSIS — K7200 Acute and subacute hepatic failure without coma: Secondary | ICD-10-CM | POA: Diagnosis not present

## 2017-05-19 DIAGNOSIS — E785 Hyperlipidemia, unspecified: Secondary | ICD-10-CM | POA: Diagnosis present

## 2017-05-19 DIAGNOSIS — K5900 Constipation, unspecified: Secondary | ICD-10-CM | POA: Diagnosis not present

## 2017-05-19 DIAGNOSIS — Z9981 Dependence on supplemental oxygen: Secondary | ICD-10-CM

## 2017-05-19 DIAGNOSIS — Z8719 Personal history of other diseases of the digestive system: Secondary | ICD-10-CM

## 2017-05-19 DIAGNOSIS — Z87891 Personal history of nicotine dependence: Secondary | ICD-10-CM

## 2017-05-19 DIAGNOSIS — N183 Chronic kidney disease, stage 3 (moderate): Secondary | ICD-10-CM | POA: Diagnosis not present

## 2017-05-19 DIAGNOSIS — J9690 Respiratory failure, unspecified, unspecified whether with hypoxia or hypercapnia: Secondary | ICD-10-CM

## 2017-05-19 DIAGNOSIS — B964 Proteus (mirabilis) (morganii) as the cause of diseases classified elsewhere: Secondary | ICD-10-CM | POA: Diagnosis present

## 2017-05-19 DIAGNOSIS — K449 Diaphragmatic hernia without obstruction or gangrene: Secondary | ICD-10-CM | POA: Diagnosis present

## 2017-05-19 DIAGNOSIS — S8012XA Contusion of left lower leg, initial encounter: Secondary | ICD-10-CM

## 2017-05-19 DIAGNOSIS — R578 Other shock: Secondary | ICD-10-CM | POA: Diagnosis not present

## 2017-05-19 DIAGNOSIS — M726 Necrotizing fasciitis: Secondary | ICD-10-CM | POA: Diagnosis not present

## 2017-05-19 DIAGNOSIS — D62 Acute posthemorrhagic anemia: Secondary | ICD-10-CM | POA: Diagnosis not present

## 2017-05-19 DIAGNOSIS — K264 Chronic or unspecified duodenal ulcer with hemorrhage: Secondary | ICD-10-CM | POA: Diagnosis not present

## 2017-05-19 DIAGNOSIS — I2510 Atherosclerotic heart disease of native coronary artery without angina pectoris: Secondary | ICD-10-CM | POA: Diagnosis not present

## 2017-05-19 DIAGNOSIS — Z710 Person encountering health services to consult on behalf of another person: Secondary | ICD-10-CM | POA: Diagnosis not present

## 2017-05-19 DIAGNOSIS — I482 Chronic atrial fibrillation: Secondary | ICD-10-CM | POA: Diagnosis not present

## 2017-05-19 DIAGNOSIS — D649 Anemia, unspecified: Secondary | ICD-10-CM | POA: Diagnosis not present

## 2017-05-19 DIAGNOSIS — R0600 Dyspnea, unspecified: Secondary | ICD-10-CM | POA: Diagnosis not present

## 2017-05-19 DIAGNOSIS — Z0189 Encounter for other specified special examinations: Secondary | ICD-10-CM

## 2017-05-19 DIAGNOSIS — I70262 Atherosclerosis of native arteries of extremities with gangrene, left leg: Secondary | ICD-10-CM | POA: Diagnosis not present

## 2017-05-19 DIAGNOSIS — Z955 Presence of coronary angioplasty implant and graft: Secondary | ICD-10-CM

## 2017-05-19 DIAGNOSIS — K263 Acute duodenal ulcer without hemorrhage or perforation: Secondary | ICD-10-CM | POA: Diagnosis not present

## 2017-05-19 DIAGNOSIS — I248 Other forms of acute ischemic heart disease: Secondary | ICD-10-CM | POA: Diagnosis not present

## 2017-05-19 DIAGNOSIS — D600 Chronic acquired pure red cell aplasia: Secondary | ICD-10-CM | POA: Diagnosis not present

## 2017-05-19 DIAGNOSIS — R778 Other specified abnormalities of plasma proteins: Secondary | ICD-10-CM

## 2017-05-19 DIAGNOSIS — I361 Nonrheumatic tricuspid (valve) insufficiency: Secondary | ICD-10-CM | POA: Diagnosis not present

## 2017-05-19 DIAGNOSIS — J9600 Acute respiratory failure, unspecified whether with hypoxia or hypercapnia: Secondary | ICD-10-CM | POA: Diagnosis not present

## 2017-05-19 DIAGNOSIS — R918 Other nonspecific abnormal finding of lung field: Secondary | ICD-10-CM | POA: Diagnosis not present

## 2017-05-19 DIAGNOSIS — I270 Primary pulmonary hypertension: Secondary | ICD-10-CM | POA: Diagnosis not present

## 2017-05-19 DIAGNOSIS — E162 Hypoglycemia, unspecified: Secondary | ICD-10-CM | POA: Diagnosis not present

## 2017-05-19 DIAGNOSIS — I509 Heart failure, unspecified: Secondary | ICD-10-CM | POA: Diagnosis not present

## 2017-05-19 DIAGNOSIS — I081 Rheumatic disorders of both mitral and tricuspid valves: Secondary | ICD-10-CM | POA: Diagnosis present

## 2017-05-19 DIAGNOSIS — T148XXA Other injury of unspecified body region, initial encounter: Secondary | ICD-10-CM | POA: Diagnosis not present

## 2017-05-19 DIAGNOSIS — I272 Pulmonary hypertension, unspecified: Secondary | ICD-10-CM | POA: Diagnosis not present

## 2017-05-19 DIAGNOSIS — J9601 Acute respiratory failure with hypoxia: Secondary | ICD-10-CM | POA: Diagnosis not present

## 2017-05-19 DIAGNOSIS — D708 Other neutropenia: Secondary | ICD-10-CM | POA: Diagnosis not present

## 2017-05-19 DIAGNOSIS — B965 Pseudomonas (aeruginosa) (mallei) (pseudomallei) as the cause of diseases classified elsewhere: Secondary | ICD-10-CM | POA: Diagnosis present

## 2017-05-19 DIAGNOSIS — T794XXA Traumatic shock, initial encounter: Secondary | ICD-10-CM | POA: Diagnosis not present

## 2017-05-19 DIAGNOSIS — Z79899 Other long term (current) drug therapy: Secondary | ICD-10-CM

## 2017-05-19 DIAGNOSIS — I959 Hypotension, unspecified: Secondary | ICD-10-CM | POA: Diagnosis present

## 2017-05-19 DIAGNOSIS — N170 Acute kidney failure with tubular necrosis: Secondary | ICD-10-CM | POA: Diagnosis not present

## 2017-05-19 DIAGNOSIS — W19XXXD Unspecified fall, subsequent encounter: Secondary | ICD-10-CM | POA: Diagnosis present

## 2017-05-19 DIAGNOSIS — B9620 Unspecified Escherichia coli [E. coli] as the cause of diseases classified elsewhere: Secondary | ICD-10-CM | POA: Diagnosis present

## 2017-05-19 DIAGNOSIS — K219 Gastro-esophageal reflux disease without esophagitis: Secondary | ICD-10-CM | POA: Diagnosis present

## 2017-05-19 DIAGNOSIS — Z4682 Encounter for fitting and adjustment of non-vascular catheter: Secondary | ICD-10-CM | POA: Diagnosis not present

## 2017-05-19 DIAGNOSIS — J9621 Acute and chronic respiratory failure with hypoxia: Secondary | ICD-10-CM | POA: Diagnosis not present

## 2017-05-19 DIAGNOSIS — L97123 Non-pressure chronic ulcer of left thigh with necrosis of muscle: Secondary | ICD-10-CM | POA: Diagnosis present

## 2017-05-19 DIAGNOSIS — Z4659 Encounter for fitting and adjustment of other gastrointestinal appliance and device: Secondary | ICD-10-CM

## 2017-05-19 DIAGNOSIS — I953 Hypotension of hemodialysis: Secondary | ICD-10-CM | POA: Diagnosis not present

## 2017-05-19 DIAGNOSIS — I4891 Unspecified atrial fibrillation: Secondary | ICD-10-CM | POA: Diagnosis not present

## 2017-05-19 DIAGNOSIS — E874 Mixed disorder of acid-base balance: Secondary | ICD-10-CM | POA: Diagnosis not present

## 2017-05-19 DIAGNOSIS — Z6839 Body mass index (BMI) 39.0-39.9, adult: Secondary | ICD-10-CM

## 2017-05-19 DIAGNOSIS — R061 Stridor: Secondary | ICD-10-CM | POA: Diagnosis not present

## 2017-05-19 DIAGNOSIS — N179 Acute kidney failure, unspecified: Secondary | ICD-10-CM

## 2017-05-19 DIAGNOSIS — I130 Hypertensive heart and chronic kidney disease with heart failure and stage 1 through stage 4 chronic kidney disease, or unspecified chronic kidney disease: Principal | ICD-10-CM | POA: Diagnosis present

## 2017-05-19 DIAGNOSIS — K269 Duodenal ulcer, unspecified as acute or chronic, without hemorrhage or perforation: Secondary | ICD-10-CM

## 2017-05-19 DIAGNOSIS — I10 Essential (primary) hypertension: Secondary | ICD-10-CM | POA: Diagnosis not present

## 2017-05-19 DIAGNOSIS — Z7189 Other specified counseling: Secondary | ICD-10-CM | POA: Diagnosis not present

## 2017-05-19 DIAGNOSIS — R404 Transient alteration of awareness: Secondary | ICD-10-CM | POA: Diagnosis not present

## 2017-05-19 DIAGNOSIS — M60004 Infective myositis, unspecified left leg: Secondary | ICD-10-CM | POA: Diagnosis not present

## 2017-05-19 DIAGNOSIS — T4145XA Adverse effect of unspecified anesthetic, initial encounter: Secondary | ICD-10-CM | POA: Diagnosis not present

## 2017-05-19 DIAGNOSIS — K266 Chronic or unspecified duodenal ulcer with both hemorrhage and perforation: Secondary | ICD-10-CM | POA: Diagnosis not present

## 2017-05-19 DIAGNOSIS — Z515 Encounter for palliative care: Secondary | ICD-10-CM

## 2017-05-19 DIAGNOSIS — R34 Anuria and oliguria: Secondary | ICD-10-CM | POA: Diagnosis not present

## 2017-05-19 DIAGNOSIS — J449 Chronic obstructive pulmonary disease, unspecified: Secondary | ICD-10-CM | POA: Diagnosis present

## 2017-05-19 DIAGNOSIS — Z452 Encounter for adjustment and management of vascular access device: Secondary | ICD-10-CM

## 2017-05-19 DIAGNOSIS — Z8249 Family history of ischemic heart disease and other diseases of the circulatory system: Secondary | ICD-10-CM

## 2017-05-19 DIAGNOSIS — D696 Thrombocytopenia, unspecified: Secondary | ICD-10-CM | POA: Diagnosis not present

## 2017-05-19 DIAGNOSIS — I2721 Secondary pulmonary arterial hypertension: Secondary | ICD-10-CM | POA: Diagnosis present

## 2017-05-19 DIAGNOSIS — E876 Hypokalemia: Secondary | ICD-10-CM | POA: Diagnosis not present

## 2017-05-19 DIAGNOSIS — D500 Iron deficiency anemia secondary to blood loss (chronic): Secondary | ICD-10-CM | POA: Diagnosis present

## 2017-05-19 DIAGNOSIS — K5730 Diverticulosis of large intestine without perforation or abscess without bleeding: Secondary | ICD-10-CM | POA: Diagnosis present

## 2017-05-19 DIAGNOSIS — S8012XS Contusion of left lower leg, sequela: Secondary | ICD-10-CM | POA: Diagnosis not present

## 2017-05-19 DIAGNOSIS — E861 Hypovolemia: Secondary | ICD-10-CM | POA: Diagnosis not present

## 2017-05-19 DIAGNOSIS — S71102A Unspecified open wound, left thigh, initial encounter: Secondary | ICD-10-CM | POA: Diagnosis not present

## 2017-05-19 DIAGNOSIS — Z825 Family history of asthma and other chronic lower respiratory diseases: Secondary | ICD-10-CM

## 2017-05-19 DIAGNOSIS — S79822S Other specified injuries of left thigh, sequela: Secondary | ICD-10-CM | POA: Diagnosis not present

## 2017-05-19 DIAGNOSIS — S7012XA Contusion of left thigh, initial encounter: Secondary | ICD-10-CM | POA: Diagnosis not present

## 2017-05-19 DIAGNOSIS — R0989 Other specified symptoms and signs involving the circulatory and respiratory systems: Secondary | ICD-10-CM | POA: Diagnosis not present

## 2017-05-19 DIAGNOSIS — K922 Gastrointestinal hemorrhage, unspecified: Secondary | ICD-10-CM | POA: Diagnosis not present

## 2017-05-19 DIAGNOSIS — L8990 Pressure ulcer of unspecified site, unspecified stage: Secondary | ICD-10-CM | POA: Insufficient documentation

## 2017-05-19 DIAGNOSIS — Z978 Presence of other specified devices: Secondary | ICD-10-CM

## 2017-05-19 DIAGNOSIS — I252 Old myocardial infarction: Secondary | ICD-10-CM

## 2017-05-19 DIAGNOSIS — R7989 Other specified abnormal findings of blood chemistry: Secondary | ICD-10-CM

## 2017-05-19 DIAGNOSIS — I5032 Chronic diastolic (congestive) heart failure: Secondary | ICD-10-CM | POA: Diagnosis not present

## 2017-05-19 DIAGNOSIS — Z66 Do not resuscitate: Secondary | ICD-10-CM | POA: Diagnosis not present

## 2017-05-19 DIAGNOSIS — L089 Local infection of the skin and subcutaneous tissue, unspecified: Secondary | ICD-10-CM | POA: Diagnosis not present

## 2017-05-19 DIAGNOSIS — Z86010 Personal history of colonic polyps: Secondary | ICD-10-CM

## 2017-05-19 DIAGNOSIS — Z1621 Resistance to vancomycin: Secondary | ICD-10-CM | POA: Diagnosis present

## 2017-05-19 DIAGNOSIS — K260 Acute duodenal ulcer with hemorrhage: Secondary | ICD-10-CM | POA: Diagnosis not present

## 2017-05-19 LAB — CBC WITH DIFFERENTIAL/PLATELET
BASOS ABS: 0 10*3/uL (ref 0.0–0.1)
BASOS PCT: 0 %
EOS ABS: 0.1 10*3/uL (ref 0.0–0.7)
Eosinophils Relative: 1 %
HEMATOCRIT: 25.5 % — AB (ref 36.0–46.0)
HEMOGLOBIN: 7.8 g/dL — AB (ref 12.0–15.0)
Lymphocytes Relative: 18 %
Lymphs Abs: 1.3 10*3/uL (ref 0.7–4.0)
MCH: 29.9 pg (ref 26.0–34.0)
MCHC: 30.6 g/dL (ref 30.0–36.0)
MCV: 97.7 fL (ref 78.0–100.0)
MONOS PCT: 17 %
Monocytes Absolute: 1.2 10*3/uL — ABNORMAL HIGH (ref 0.1–1.0)
NEUTROS ABS: 4.5 10*3/uL (ref 1.7–7.7)
NEUTROS PCT: 64 %
Platelets: 184 10*3/uL (ref 150–400)
RBC: 2.61 MIL/uL — AB (ref 3.87–5.11)
RDW: 14.5 % (ref 11.5–15.5)
WBC: 7.1 10*3/uL (ref 4.0–10.5)

## 2017-05-19 LAB — BASIC METABOLIC PANEL
ANION GAP: 7 (ref 5–15)
BUN: 41 mg/dL — ABNORMAL HIGH (ref 6–20)
CHLORIDE: 104 mmol/L (ref 101–111)
CO2: 28 mmol/L (ref 22–32)
Calcium: 9.4 mg/dL (ref 8.9–10.3)
Creatinine, Ser: 1.14 mg/dL — ABNORMAL HIGH (ref 0.44–1.00)
GFR calc non Af Amer: 44 mL/min — ABNORMAL LOW (ref >60)
GFR, EST AFRICAN AMERICAN: 51 mL/min — AB (ref >60)
Glucose, Bld: 120 mg/dL — ABNORMAL HIGH (ref 65–99)
Potassium: 5.3 mmol/L — ABNORMAL HIGH (ref 3.5–5.1)
SODIUM: 139 mmol/L (ref 135–145)

## 2017-05-19 LAB — BRAIN NATRIURETIC PEPTIDE: B Natriuretic Peptide: 748 pg/mL — ABNORMAL HIGH (ref 0.0–100.0)

## 2017-05-19 LAB — TROPONIN I: TROPONIN I: 0.06 ng/mL — AB (ref <0.03)

## 2017-05-19 LAB — LACTIC ACID, PLASMA: LACTIC ACID, VENOUS: 0.7 mmol/L (ref 0.5–1.9)

## 2017-05-19 MED ORDER — SODIUM CHLORIDE 0.9 % IV SOLN
250.0000 mL | INTRAVENOUS | Status: DC | PRN
  Administered 2017-06-04 (×2): via INTRAVENOUS

## 2017-05-19 MED ORDER — HEPARIN SODIUM (PORCINE) 5000 UNIT/ML IJ SOLN
5000.0000 [IU] | Freq: Three times a day (TID) | INTRAMUSCULAR | Status: DC
  Administered 2017-05-19 – 2017-05-20 (×3): 5000 [IU] via SUBCUTANEOUS
  Filled 2017-05-19 (×5): qty 1

## 2017-05-19 MED ORDER — ACETAMINOPHEN 325 MG PO TABS
650.0000 mg | ORAL_TABLET | ORAL | Status: DC | PRN
  Administered 2017-05-19 – 2017-05-26 (×8): 650 mg via ORAL
  Filled 2017-05-19 (×8): qty 2

## 2017-05-19 MED ORDER — ONDANSETRON HCL 4 MG/2ML IJ SOLN: 4.0000 mg | Freq: Four times a day (QID) | INTRAMUSCULAR | Status: DC | PRN

## 2017-05-19 MED ORDER — SODIUM CHLORIDE 0.9% FLUSH
3.0000 mL | INTRAVENOUS | Status: DC | PRN
  Administered 2017-05-20 – 2017-06-14 (×5): 3 mL via INTRAVENOUS
  Filled 2017-05-19 (×4): qty 3

## 2017-05-19 MED ORDER — FUROSEMIDE 10 MG/ML IJ SOLN
40.0000 mg | Freq: Two times a day (BID) | INTRAMUSCULAR | Status: DC
  Administered 2017-05-20 – 2017-05-21 (×4): 40 mg via INTRAVENOUS
  Filled 2017-05-19 (×4): qty 4

## 2017-05-19 MED ORDER — SODIUM CHLORIDE 0.9% FLUSH
3.0000 mL | Freq: Two times a day (BID) | INTRAVENOUS | Status: DC
  Administered 2017-05-19 – 2017-06-08 (×30): 3 mL via INTRAVENOUS
  Administered 2017-06-09: 10 mL via INTRAVENOUS
  Administered 2017-06-10 – 2017-06-17 (×9): 3 mL via INTRAVENOUS

## 2017-05-19 MED ORDER — POTASSIUM CHLORIDE 20 MEQ/15ML (10%) PO SOLN
40.0000 meq | Freq: Every day | ORAL | Status: DC
  Administered 2017-05-19: 40 meq via ORAL
  Filled 2017-05-19: qty 30

## 2017-05-19 MED ORDER — ACETAMINOPHEN 325 MG PO TABS
650.0000 mg | ORAL_TABLET | Freq: Once | ORAL | Status: AC
  Administered 2017-05-19: 650 mg via ORAL
  Filled 2017-05-19: qty 2

## 2017-05-19 MED ORDER — FUROSEMIDE 10 MG/ML IJ SOLN
20.0000 mg | Freq: Once | INTRAMUSCULAR | Status: AC
  Administered 2017-05-19: 20 mg via INTRAVENOUS
  Filled 2017-05-19: qty 2

## 2017-05-19 MED ORDER — FUROSEMIDE 10 MG/ML IJ SOLN: 40.0000 mg | Freq: Two times a day (BID) | INTRAMUSCULAR | Status: DC

## 2017-05-19 NOTE — H&P (Addendum)
History and Physical    [name] KWI:[id number] DOB: 21-Jun-1935 DOA: 05/23/2017  Referring MD/NP/PA: Maryan Rued  PCP: Redmond School, MD  Outpatient Specialist: [facility]  Patient coming from: SNF   Chief Complaint: Acute on chronic diastolic CHF exacerbation , hypotension, elevated troponin   HPI: [name] is a 82 y.o. female with medical history significant of chronic atrial fibrillation, diastolic CHF, stage III CKD, pulmonary hypertension as well as recent left traumatic left leg hematoma complicated by hypovolemic shock and acute blood loss anemia presenting with acute on chronic diastolic CHF exacerbation as well as hypertension and elevated troponin. Patient noted to have been recently admitted September 22 to September 28 for left leg traumatic hematoma with noted hypovolemic shock and acute blood loss anemia. Patient was noted to have been transferred to a rehabilitation facility after hospitalization. Per report, patient with progressive weakness since being placed within the rehabilitation facility. No reported worsening of leg hematoma though patient has had progressive edema. No reported fevers or chills. No chest pain. No reported orthopnea or PND. Has been compliant with medication regimen. No reported high salt intake.  ED Course: Presented to the ER afebrile with noted systolic blood pressures in 90s to 100s. Creatinine 1.14, potassium 5.3. Troponin of 0.06. BNP is 748. Chest x-ray with cardiomegaly and central vascular congestion mild hazy bibasilar basilar atelectasis versus infiltrates.  Review of Systems: As per HPI otherwise 10 point review of systems negative.    Past Medical History:  Diagnosis Date  . A-fib ([place])   . Arthritis    "knees" (11/17/2015)  . CAD (coronary artery disease)    BMS to LAD 07/2005  . Chronic atrial fibrillation ([place])    Since 2011  . Degenerative arthritis of right knee   . Essential hypertension   . GERD  (gastroesophageal reflux disease)   . Hyperlipidemia   . Morbid obesity ([place])   . Myocardial infarction ([place]) 07/2005  . On home oxygen therapy    "2.5L at night" (11/17/2015)  . Psoriasis   . Pulmonary hypertension ([facility])    Mixed, PASP 67 mmHg at [facility] 2013  . Right ventricular dysfunction   . Tricuspid regurgitation    Severe    Past Surgical History:  Procedure Laterality Date  . ACHILLES TENDON SURGERY Right 1970s  . CARDIAC CATHETERIZATION N/A 12/07/2015   Procedure: Right Heart Cath;  Surgeon: Jolaine Artist, MD;  Location: [facility];  Service: Cardiovascular;  Laterality: N/A;  . CARPAL TUNNEL RELEASE  11/29/2011   Procedure: CARPAL TUNNEL RELEASE;  Surgeon: Cammie Sickle., MD;  Location: [facility];  Service: Orthopedics;  Laterality: Right;  . COLONOSCOPY    . CORONARY ANGIOPLASTY WITH STENT PLACEMENT  07/2005   Dr. Olevia Perches  . EYE SURGERY    . LAPAROSCOPIC CHOLECYSTECTOMY    . RETINAL DETACHMENT SURGERY Right      reports that she quit smoking about 30 years ago. Her smoking use included Cigarettes. She has a 0.15 pack-year smoking history. She has never used smokeless tobacco. She reports that she does not drink alcohol or use drugs.  No Known Allergies  Family History  Problem Relation Age of Onset  . Heart attack Mother   . Coronary artery disease Father   . Stroke Father   . Heart attack Brother   . Heart failure Brother   . COPD Brother   . Emphysema Sister   . Emphysema Brother      Prior  to Admission medications   Medication Sig Start Date End Date Taking? Authorizing Provider  ALPRAZolam Duanne Moron) 0.5 MG tablet Take 1 tablet (0.5 mg total) by mouth at bedtime. 05/16/17  Yes Arrien, Jimmy Picket, MD  ambrisentan (LETAIRIS) 5 MG tablet Take 1 tablet (5 mg total) by mouth daily. 02/18/17  Yes Collene Gobble, MD  atorvastatin (LIPITOR) 80 MG tablet TAKE 1/2 TABLET BY MOUTH DAILY Patient taking differently: TAKE 1/2 TABLET (40  MG) BY MOUTH DAILY AT BEDTIME 08/07/15  Yes Satira Sark, MD  azelastine (ASTELIN) 0.1 % nasal spray Place 2 sprays into both nostrils 2 (two) times daily as needed (congestion).  02/21/17  Yes [provider]  brimonidine (ALPHAGAN) 0.2 % ophthalmic solution Place 1 drop into the right eye 2 (two) times daily.  04/21/15  Yes [provider]  brinzolamide (AZOPT) 1 % ophthalmic suspension Place 1 drop into both eyes 2 (two) times daily.   Yes [provider]  HYDROcodone-acetaminophen (NORCO/VICODIN) 5-325 MG tablet Take 1 tablet by mouth 3 (three) times daily as needed for moderate pain. 05/16/17  Yes Arrien, Jimmy Picket, MD  latanoprost (XALATAN) 0.005 % ophthalmic solution Place 1 drop into the right eye at bedtime.  03/30/12  Yes [provider]  nitroGLYCERIN (NITROSTAT) 0.4 MG SL tablet Place 0.4 mg under the tongue every 5 (five) minutes as needed for chest pain (x 3 tablets daily). Reported on 12/28/2015   Yes [provider]  Omeprazole 20 MG TBEC Take 20 mg by mouth daily.    Yes [provider]  OXYGEN Inhale 2.5 L into the lungs at bedtime.   Yes [provider]  timolol (TIMOPTIC) 0.5 % ophthalmic solution Place 1 drop into both eyes 2 (two) times daily. 11/20/15  Yes [provider]    Physical Exam: Vitals:   05/29/2017 1800 06/01/2017 1830 06/10/2017 2000 06/08/2017 2030  BP: (!) 108/54 (!) 96/53 (!) 100/48 (!) 95/46  Pulse: 78 78 67 72  Resp: (!) 25 (!) 22 19 18   Temp:      TempSrc:      SpO2: 97% 99% 98% 99%  Weight:      Height:          Constitutional: NAD, calm, comfortable Vitals:   05/22/2017 1800 06/06/2017 1830 05/27/2017 2000 05/30/2017 2030  BP: (!) 108/54 (!) 96/53 (!) 100/48 (!) 95/46  Pulse: 78 78 67 72  Resp: (!) 25 (!) 22 19 18   Temp:      TempSrc:      SpO2: 97% 99% 98% 99%  Weight:      Height:       GEN: In bed, NAD, morbidly obese  Eyes: PERRL, lids and conjunctivae normal ENMT:  Mucous membranes are moist. Posterior pharynx clear of any exudate or lesions.Normal dentition.  Neck: normal, supple, no masses, no thyromegaly Respiratory: clear to auscultation bilaterally, no wheezing, no crackles. Normal respiratory effort. No accessory muscle use.  Cardiovascular: Regular rate and rhythm, no murmurs / rubs / gallops. No extremity edema. 2+ pedal pulses. No carotid bruits.  Abdomen: no tenderness, no masses palpated. No hepatosplenomegaly. Bowel sounds positive.  Musculoskeletal: no clubbing / cyanosis. No joint deformity upper and lower extremities. Good ROM, no contractures. Normal muscle tone.  Skin: no rashes, lesions, ulcers. No induration Neurologic: CN 2-12 grossly intact. Sensation intact, DTR normal. Strength 5/5 in all 4.  Psychiatric: Normal judgment and insight. Alert and oriented x 3. Normal mood.    Labs on  Admission: I have personally reviewed following labs and imaging studies  CBC:  Recent Labs Lab 05/13/17 0211 05/13/17 1509 05/14/17 0553 05/15/17 1049 05/16/17 0505 05/26/2017 1522  WBC 5.8 5.9 6.7 8.4  --  7.1  NEUTROABS  --   --   --   --   --  4.5  HGB 7.7* 7.9* 7.9* 8.0* 8.0* 7.8*  HCT 24.7* 25.5* 25.6* 26.3* 25.9* 25.5*  MCV 94.3 95.5 95.9 96.0  --  97.7  PLT 88* 89* 94* 126*  --  409   Basic Metabolic Panel:  Recent Labs Lab 05/13/17 0211 05/14/17 0553 05/15/17 1049 05/23/2017 1522  NA 142 143 137 139  K 4.4 4.4 4.8 5.3*  CL 113* 111 106 104  CO2 26 27 25 28   GLUCOSE 99 99 151* 120*  BUN 31* 17 23* 41*  CREATININE 0.88 0.81 1.03* 1.14*  CALCIUM 8.7* 9.0 9.1 9.4   GFR: Estimated Creatinine Clearance: 46.5 mL/min (A) (by C-G formula based on SCr of 1.14 mg/dL (H)). Liver Function Tests: No results for input(s): AST, ALT, ALKPHOS, BILITOT, PROT, ALBUMIN in the last 168 hours. No results for input(s): LIPASE, AMYLASE in the last 168 hours. No results for input(s): AMMONIA in the last 168 hours. Coagulation Profile: No results  for input(s): INR, PROTIME in the last 168 hours. Cardiac Enzymes:  Recent Labs Lab 05/27/2017 1648  TROPONINI 0.06*   BNP (last 3 results) No results for input(s): PROBNP in the last 8760 hours. HbA1C: No results for input(s): HGBA1C in the last 72 hours. CBG: No results for input(s): GLUCAP in the last 168 hours. Lipid Profile: No results for input(s): CHOL, HDL, LDLCALC, TRIG, CHOLHDL, LDLDIRECT in the last 72 hours. Thyroid Function Tests: No results for input(s): TSH, T4TOTAL, FREET4, T3FREE, THYROIDAB in the last 72 hours. Anemia Panel: No results for input(s): VITAMINB12, FOLATE, FERRITIN, TIBC, IRON, RETICCTPCT in the last 72 hours. Urine analysis:    Component Value Date/Time   COLORURINE YELLOW 02/10/2016 1503   APPEARANCEUR CLOUDY (A) 02/10/2016 1503   LABSPEC 1.014 02/10/2016 1503   PHURINE 5.5 02/10/2016 1503   GLUCOSEU NEGATIVE 02/10/2016 1503   HGBUR NEGATIVE 02/10/2016 1503   BILIRUBINUR NEGATIVE 02/10/2016 1503   KETONESUR NEGATIVE 02/10/2016 1503   PROTEINUR NEGATIVE 02/10/2016 1503   NITRITE NEGATIVE 02/10/2016 1503   LEUKOCYTESUR NEGATIVE 02/10/2016 1503   Sepsis Labs: @LABRCNTIP (procalcitonin:4,lacticidven:4) ) Recent Results (from the past 240 hour(s))  MRSA PCR Screening     Status: None   Collection Time: 05/13/17  6:57 AM  Result Value Ref Range Status   MRSA by PCR NEGATIVE NEGATIVE Final    Comment:        The GeneXpert MRSA Assay (FDA approved for NASAL specimens only), is one component of a comprehensive MRSA colonization surveillance program. It is not intended to diagnose MRSA infection nor to guide or monitor treatment for MRSA infections.      Radiological Exams on Admission: Dg Chest Port 1 View  Result Date: 06/18/2017 CLINICAL DATA:  Hypotension EXAM: PORTABLE CHEST 1 VIEW COMPARISON:  10/17/2016 FINDINGS: Cardiomegaly with central vascular congestion. No consolidation or large effusion. Hazy bibasilar atelectasis or mild  infiltrates. Aortic atherosclerosis. No pneumothorax. IMPRESSION: Cardiomegaly with central vascular congestion. Mild hazy bibasilar atelectasis or infiltrates. Electronically Signed   By: Donavan Foil M.D.   On: 05/26/2017 17:18    EKG: Independently reviewed. Atrial fibrillation. HR 69.   Assessment/Plan Active Problems:   Acute on chronic diastolic CHF (congestive heart failure) (  [place])   Hypotension   Elevated troponin    1-Acute on Chronic Diastolic CHF -noted CXR w/ cardiomegaly and central vascular congestion -BNP 748 -start on IV lasix  -given hypotension, will need to be transferred to [facility] for closer evaluation by cardiology and pulmonology in setting of pulm HTN.   2-Hypotension -multifactorial in setting of above and pulm HTN  -hold offending meds in setting of above -pending formal eval by cards and pulmology  3-Elevated trop  -likely mild demand mismatch in setting of above -trend w/ diuresis in setting of above  4-L leg hematoma  -appears stable  -follow  5-Anemia  -secondary to traumatic blood loss w/ #4  -hgb stable  -transfuse pRBCs prn   6-Atrial fibrillation  -HR stable  -cont home regimen  -hold AC in setting of L leg hematoma   7-Pulm HTN  -titrate regimen in setting of above -f/u pulm c/s    DVT prophylaxis: sub q heparin  Code Status: Full Code   Family Communication: Husband at bedside   Disposition Plan: Pending further evaluation and transfer to [facility]. Anticipate >2MN   Consults called: Dr Arlyce Dice w/ on call cards at [facility] aware of case per EDP. Will need formal c/s w/ cards and pulm on arrival to [facility].   Admission status: Inpt    Deneise Lever MD [facility] Pager [phone]  If 7PM-7AM, please contact night-coverage [email or web address] Password [email or web address]  06/06/2017, 9:05 PM

## 2017-05-19 NOTE — ED Notes (Signed)
Date and time results received: 05/20/2017 .now (use smartphrase ".now" to insert current time)  Test: Troponin Critical Value: 0.06  Name of Provider Notified: [place]  Orders Received? Or Actions Taken?: Orders Received - See Orders for details

## 2017-05-19 NOTE — ED Provider Notes (Signed)
[facility] DEPT Provider Note   CSN: [id number] Arrival date & time: 06/07/2017  1448     History   Chief Complaint Chief Complaint  Patient presents with  . Hypotension    HPI [place] is a 82 y.o. female.  Patient is a 82 year old female with a significant past medical history of A. Fib, coronary artery disease, MI, pulmonary hypertension, chronic kidney disease and recent hospitalization after a mechanical fall with a large left leg hematoma resulting in her receiving 2 units of blood due to severe bleeding presenting today by EMS. At the time of the fall patient was on anticoagulation but has not been on anticoagulation since then. She has been at rehabilitation and was actually on her way to wound care today. EMSchecked her blood pressure and it was 80/40 and due to their level of care do not feel comfortable continuing to transport her by her here. Patient states she's been having leg pain but is not really any different than what it's been since her fall. She feels that her wound is starting to look better. She denies any fevers, cough, chest pain, dizziness, shortness of breath or new swelling in her legs. She does take a blood pressure pill daily which she has taken today. She was also given hydrocodone prior to arrival her comfort during transport.   The history is provided by the patient and the EMS personnel.    Past Medical History:  Diagnosis Date  . A-fib ([place])   . Arthritis    "knees" (11/17/2015)  . CAD (coronary artery disease)    BMS to LAD 07/2005  . Chronic atrial fibrillation ([place])    Since 2011  . Degenerative arthritis of right knee   . Essential hypertension   . GERD (gastroesophageal reflux disease)   . Hyperlipidemia   . Morbid obesity ([place])   . Myocardial infarction ([place]) 07/2005  . On home oxygen therapy    "2.5L at night" (11/17/2015)  . Psoriasis   . Pulmonary hypertension (HCC)    Mixed, PASP 67 mmHg at [facility] 2013  . Right  ventricular dysfunction   . Tricuspid regurgitation    Severe    Patient Active Problem List   Diagnosis Date Noted  . Arterial hypotension   . Acute blood loss anemia 05/11/2017  . CKD (chronic kidney disease), stage III ([place]) 05/10/2017  . Thigh hematoma, left, initial encounter 05/10/2017  . Leg hematoma, left, initial encounter 05/10/2017  . Chronic respiratory failure ([place]) 07/22/2016  . Chronic rhinitis 02/08/2016  . Chronic diastolic heart failure ([place]) 12/28/2015  . PAH (pulmonary artery hypertension) ([place])   . CHF (congestive heart failure) ([place]) 11/17/2015  . Acute congestive heart failure ([place]) 11/17/2015  . Pulmonary nodules 10/07/2013  . Encounter for therapeutic drug monitoring 09/22/2013  . DOE (dyspnea on exertion) 05/26/2012  . Psoriasis   . Carotid bruit   . Warfarin anticoagulation   . CAD (coronary artery disease)   . Pulmonary hypertension ([place]) 04/19/2011  . Atrial fibrillation ([place]) 11/16/2010  . GERD 04/17/2010  . COLONIC POLYPS, HX OF 04/17/2010  . Hyperlipidemia 04/17/2009  . Overweight(278.02) 04/17/2009  . Essential hypertension 04/17/2009    Past Surgical History:  Procedure Laterality Date  . ACHILLES TENDON SURGERY Right 1970s  . CARDIAC CATHETERIZATION N/A 12/07/2015   Procedure: Right Heart Cath;  Surgeon: Jolaine Artist, MD;  Location: [facility];  Service: Cardiovascular;  Laterality: N/A;  . CARPAL TUNNEL RELEASE  11/29/2011   Procedure: CARPAL  TUNNEL RELEASE;  Surgeon: Cammie Sickle., MD;  Location: [facility];  Service: Orthopedics;  Laterality: Right;  . COLONOSCOPY    . CORONARY ANGIOPLASTY WITH STENT PLACEMENT  07/2005   Dr. Olevia Perches  . EYE SURGERY    . LAPAROSCOPIC CHOLECYSTECTOMY    . RETINAL DETACHMENT SURGERY Right     OB History    No data available       Home Medications    Prior to Admission medications   Medication Sig Start Date End Date Taking? Authorizing Provider  ALPRAZolam  Duanne Moron) 0.5 MG tablet Take 1 tablet (0.5 mg total) by mouth at bedtime. 05/16/17   Arrien, Jimmy Picket, MD  alum hydroxide-mag trisilicate (GAVISCON) 80-99 MG CHEW chewable tablet Chew 1 tablet by mouth 3 (three) times daily as needed for indigestion or heartburn.    [provider]  ambrisentan (LETAIRIS) 5 MG tablet Take 1 tablet (5 mg total) by mouth daily. Patient taking differently: Take 5 mg by mouth at bedtime.  02/18/17   Collene Gobble, MD  atorvastatin (LIPITOR) 80 MG tablet TAKE 1/2 TABLET BY MOUTH DAILY Patient taking differently: TAKE 1/2 TABLET (40 MG) BY MOUTH DAILY AT BEDTIME 08/07/15   Satira Sark, MD  azelastine (ASTELIN) 0.1 % nasal spray Place 2 sprays into both nostrils 2 (two) times daily as needed (congestion).  02/21/17   [provider]  brimonidine (ALPHAGAN) 0.2 % ophthalmic solution Place 1 drop into the right eye 2 (two) times daily.  04/21/15   [provider]  brinzolamide (AZOPT) 1 % ophthalmic suspension Place 1 drop into both eyes 2 (two) times daily.    [provider]  HYDROcodone-acetaminophen (NORCO/VICODIN) 5-325 MG tablet Take 1 tablet by mouth 3 (three) times daily as needed for moderate pain. 05/16/17   Arrien, Jimmy Picket, MD  latanoprost (XALATAN) 0.005 % ophthalmic solution Place 1 drop into the right eye at bedtime.  03/30/12   [provider]  nitroGLYCERIN (NITROSTAT) 0.4 MG SL tablet Place 0.4 mg under the tongue every 5 (five) minutes as needed for chest pain (x 3 tablets daily). Reported on 12/28/2015    [provider]  omeprazole (PRILOSEC) 40 MG capsule Take 40 mg by mouth daily.    [provider]  OXYGEN Inhale 2.5 L into the lungs at bedtime.    [provider]  PRESCRIPTION MEDICATION Apply 1 application topically See admin instructions. Ketoconazole/fluticasone compounded by [facility] - apply topically twice daily (under breasts, stomach folds and between legs) as  needed for yeast infection    [provider]  timolol (TIMOPTIC) 0.5 % ophthalmic solution Place 1 drop into both eyes 2 (two) times daily. 11/20/15   [provider]    Family History Family History  Problem Relation Age of Onset  . Heart attack Mother   . Coronary artery disease Father   . Stroke Father   . Heart attack Brother   . Heart failure Brother   . COPD Brother   . Emphysema Sister   . Emphysema Brother     Social History Social History  Substance Use Topics  . Smoking status: Former Smoker    Packs/day: 0.30    Years: 0.50    Types: Cigarettes    Quit date: 08/19/1986  . Smokeless tobacco: Never Used     Comment: pt only smoked for only a few months.  . Alcohol use No     Allergies   Patient has  no known allergies.   Review of Systems Review of Systems  All other systems reviewed and are negative.    Physical Exam Updated Vital Signs BP (!) 103/58   Pulse 79   Temp 98.8 F (37.1 C) (Oral)   Resp (!) 8   Ht 5\' 5"  (1.651 m)   Wt 101.6 kg (224 lb)   SpO2 94%   BMI 37.28 kg/m   Physical Exam  Constitutional: She is oriented to person, place, and time. She appears well-developed and well-nourished. No distress.  Morbidly obese  HENT:  Head: Normocephalic and atraumatic.  Eyes: Pupils are equal, round, and reactive to light. EOM are normal.  Cardiovascular: Normal rate, normal heart sounds and intact distal pulses.  An irregularly irregular rhythm present. Exam reveals no friction rub.   No murmur heard. Pulmonary/Chest: Effort normal. She has decreased breath sounds in the right lower field and the left lower field. She has no wheezes. She has no rales.  Abdominal: Soft. Bowel sounds are normal. She exhibits no distension. There is no tenderness. There is no rebound and no guarding.  Musculoskeletal: Normal range of motion. She exhibits no tenderness.  Resolving ecchymosis in the left ankle and foot.  2+ DP pulse and normal  sensation. No edema  Neurological: She is alert and oriented to person, place, and time. No cranial nerve deficit.  Skin: Skin is warm and dry. No rash noted.  Large healing wound over the left upper and lower medial leg.  Areas of large hematoma with bullous formation which has ruptured.  Some black eschar over areas.  No erythema, warmth or drainage.  Almost all areas are soft except for eschar.  Psychiatric: She has a normal mood and affect. Her behavior is normal.  Nursing note and vitals reviewed.    ED Treatments / Results  Labs (all labs ordered are listed, but only abnormal results are displayed) Labs Reviewed  CBC WITH DIFFERENTIAL/PLATELET - Abnormal; Notable for the following:       Result Value   RBC 2.61 (*)    Hemoglobin 7.8 (*)    HCT 25.5 (*)    Monocytes Absolute 1.2 (*)    All other components within normal limits  BASIC METABOLIC PANEL - Abnormal; Notable for the following:    Potassium 5.3 (*)    Glucose, Bld 120 (*)    BUN 41 (*)    Creatinine, Ser 1.14 (*)    GFR calc non Af Amer 44 (*)    GFR calc Af Amer 51 (*)    All other components within normal limits  TROPONIN I - Abnormal; Notable for the following:    Troponin I 0.06 (*)    All other components within normal limits  BRAIN NATRIURETIC PEPTIDE - Abnormal; Notable for the following:    B Natriuretic Peptide 748.0 (*)    All other components within normal limits  LACTIC ACID, PLASMA    EKG  EKG Interpretation  Date/Time:  Monday May 19 2017 14:53:47 EDT Ventricular Rate:  69 PR Interval:    QRS Duration: 90 QT Interval:  344 QTC Calculation: 369 R Axis:   -5 Text Interpretation:  Atrial fibrillation Low voltage, precordial leads Consider anterior infarct No significant change since last tracing Confirmed by Blanchie Dessert ([id number]) on 06/18/2017 3:25:13 PM       Radiology Dg Chest Port 1 View  Result Date: 05/22/2017 CLINICAL DATA:  Hypotension EXAM: PORTABLE CHEST 1 VIEW  COMPARISON:  10/17/2016 FINDINGS: Cardiomegaly with central  vascular congestion. No consolidation or large effusion. Hazy bibasilar atelectasis or mild infiltrates. Aortic atherosclerosis. No pneumothorax. IMPRESSION: Cardiomegaly with central vascular congestion. Mild hazy bibasilar atelectasis or infiltrates. Electronically Signed   By: Donavan Foil M.D.   On: 05/24/2017 17:18    Procedures Procedures (including critical care time)  Medications Ordered in ED Medications - No data to display   Initial Impression / Assessment and Plan / ED Course  I have reviewed the triage vital signs and the nursing notes.  Pertinent labs & imaging results that were available during my care of the patient were reviewed by me and considered in my medical decision making (see chart for details).    Pt presenting today by EMS due to low blood pressure. Patient denies any complaints at this time. She was actually headed to wound care because she has a large hematoma on her left leg after a fall about a week ago. Patient does take blood pressure medication. Based on prior hospital stay about a week ago blood pressure ranged from high 80s to low 100s. She is still taking a blood pressure medication. She denies any infectious type symptoms such as fever, cough, dysuria, abdominal pain, nausea, vomiting, diarrhea. She states she's been eating and drinking well. She had no complaints at this time. Blood pressure here is 103/58. Heart rate is less than 100. She is well-appearing. She is slightly sleepy but this could be because she received hydrocodone prior to transport to wound care. Her wound appears to be healing appropriately. There is no signs of infection at this time. We'll do basic lab work to ensure no worsening renal function, worsening anemia orsigns of sepsis.  Patient's wound was redressed. However viral labs are normal. That it would be reasonable for patient to be transported back home.  6:13 PM Family  arrived and states the patient has been more short of breath. At the facility her blood pressure is beenextremely erratic making it difficult for her to participate in PT. Also facility states her wound is more extensive than what they can take care of. Family is also noticed worsening swelling in her lower extremities however she was taken off Lasix prior to leaving the hospital due to hypotension. Labs today show worsening BNP of 750 from baseline in the 200s. Patient has a chronic troponin leak and x-ray appears to have signs of fluid overload. Patient was given a dose of Lasix and will admit for further care.  7:13 PM Due to patient's severe pulmonary hypertension, soft blood pressures she will be a difficult diuresis. Spoke with Dr. Arlyce Dice cardiology who recommended giving 40 mg of Lasix here but she will need transportation to [facility] for ongoing diuresis, pulmonary and cardiology consults.  Final Clinical Impressions(s) / ED Diagnoses   Final diagnoses:  Other specified hypotension  Pulmonary hypertension (HCC)  Anemia, unspecified type  Acute on chronic congestive heart failure, unspecified heart failure type [facility])    New Prescriptions New Prescriptions   No medications on file     Blanchie Dessert, MD 06/01/2017 1913

## 2017-05-19 NOTE — ED Notes (Signed)
Patient requesting something for pain. EDP made aware. 

## 2017-05-19 NOTE — ED Triage Notes (Signed)
Patient brought in by [facility] from [facility] in [place]. patient was going to [facility] for wound issues on left leg. EMS states patient BP was low prior to transport, patient was given Norco prior to leaving and during transport patients BP dropped to 80/40. BLS crew came to [facility] due to level of care they are able to transport. Patient on 4 lpm of home oxygen,. Patient denies pain. A&Ox4.

## 2017-05-20 ENCOUNTER — Inpatient Hospital Stay (HOSPITAL_COMMUNITY): Payer: Medicare Other

## 2017-05-20 ENCOUNTER — Other Ambulatory Visit: Payer: Self-pay | Admitting: Licensed Clinical Social Worker

## 2017-05-20 DIAGNOSIS — D649 Anemia, unspecified: Secondary | ICD-10-CM | POA: Insufficient documentation

## 2017-05-20 LAB — BASIC METABOLIC PANEL
ANION GAP: 6 (ref 5–15)
BUN: 39 mg/dL — ABNORMAL HIGH (ref 6–20)
CALCIUM: 8.9 mg/dL (ref 8.9–10.3)
CO2: 27 mmol/L (ref 22–32)
CREATININE: 0.97 mg/dL (ref 0.44–1.00)
Chloride: 103 mmol/L (ref 101–111)
GFR, EST NON AFRICAN AMERICAN: 54 mL/min — AB (ref >60)
Glucose, Bld: 90 mg/dL (ref 65–99)
Potassium: 5.1 mmol/L (ref 3.5–5.1)
SODIUM: 136 mmol/L (ref 135–145)

## 2017-05-20 MED ORDER — TIMOLOL MALEATE 0.5 % OP SOLN
1.0000 [drp] | Freq: Two times a day (BID) | OPHTHALMIC | Status: DC
  Administered 2017-05-20 – 2017-06-17 (×52): 1 [drp] via OPHTHALMIC
  Filled 2017-05-20 (×2): qty 5

## 2017-05-20 MED ORDER — BRIMONIDINE TARTRATE 0.2 % OP SOLN
1.0000 [drp] | Freq: Two times a day (BID) | OPHTHALMIC | Status: DC
  Administered 2017-05-20 – 2017-06-17 (×53): 1 [drp] via OPHTHALMIC
  Filled 2017-05-20 (×2): qty 5

## 2017-05-20 MED ORDER — BRINZOLAMIDE 1 % OP SUSP
1.0000 [drp] | Freq: Two times a day (BID) | OPHTHALMIC | Status: DC
  Administered 2017-05-20 – 2017-06-17 (×53): 1 [drp] via OPHTHALMIC
  Filled 2017-05-20 (×2): qty 10

## 2017-05-20 MED ORDER — HYDROCODONE-ACETAMINOPHEN 5-325 MG PO TABS
1.0000 | ORAL_TABLET | Freq: Three times a day (TID) | ORAL | Status: DC | PRN
  Administered 2017-05-21 – 2017-05-22 (×2): 1 via ORAL
  Filled 2017-05-20 (×2): qty 1

## 2017-05-20 MED ORDER — LATANOPROST 0.005 % OP SOLN
1.0000 [drp] | Freq: Every day | OPHTHALMIC | Status: DC
  Administered 2017-05-20 – 2017-06-16 (×27): 1 [drp] via OPHTHALMIC
  Filled 2017-05-20 (×2): qty 2.5

## 2017-05-20 NOTE — Patient Outreach (Signed)
Assessment:  CSW received referral on [name]. CSW completed chart review on client on 05/20/17.  Client had discharged recently from the hospital and had admitted to [facility] facility in [place], [place] to receive care. However, client presented to the emergency department at [facility] on 05/23/2017 with complaints of weakness. Client is currently at [facility] and plan is for client to transfer to [facility] for further care.  CSW called [name], [facility] RN, on 05/20/17 to discuss client needs. [name] reported that client had fallen recently at home.  Client currently has a hematoma on her leg and is receiving care for this hematoma.  Client has multiple medical issues. Her blood pressure had been fluctuating.  She is currently hospitalized at [facility] for care.  CSW to monitor client status and needs and to monitor client's discharge plans.  CSW to collaborate with RN [name] as needed to discuss client status and needs.  Plan:  CSW to monitor client status, needs and discharge plans for the next week..  CSW to collaborate with RN [name], as needed, to monitor client needs and discharge plans.  [name].[name] MSW, LCSW Licensed Clinical Social Worker [facility] Care Management [phone]

## 2017-05-20 NOTE — ED Notes (Signed)
Nurse Jeani Hawking to call back when able to take report.  Name and number provided to call back

## 2017-05-20 NOTE — Progress Notes (Signed)
PROGRESS NOTE  [name] XHB:[id number] DOB: 08/08/1935 DOA: 06/12/2017 PCP: Redmond School, MD  Brief History:   82 y.o. female with medical history significant for atrial fibrillation on Eliquis coronary artery disease, pulmonary hypertension, chronic kidney disease stage III, chronic diastolic CHF, and chronic anemia presenting from SNF with hypotension, leg pain, and some worsening of leg edema. Upon EMS arrival, patient was noted to have SBP in 80s.  The patient's has had erratic BPs at [facility] limiting PT ranging from high 80s to low 100s.. Also facility states her wound is more extensive than what they can take care of. Family is also noticed worsening swelling in her lower extremities however she was taken off torsemide during her last hospitalization due to hypotension.  Presently, Patient denies fevers, chills, headache, chest pain, dyspnea, nausea, vomiting, diarrhea, abdominal pain, dysuria, hematuria, hematochezia, and melena. Due to patient's severe pulmonary hypertension, soft blood pressures she will be a difficult diuresis. Spoke with Dr. Marlou Porch, cardiology, who recommended giving 40 mg of Lasix here but she will need transportation to [facility] for ongoing diuresis, pulmonary and cardiology consults.  In the emergency department, the patient was afebrile with soft blood pressures in the 90s and low 100s. Chest x-ray showed increased interstitial markings and pulmonary vascular congestion. The patient was given furosemide 20 mg IV x 1   Assessment/Plan: Acute on Chronic diastolic CHF and pulmonary arterial hypertension  -with intermittent hypotension -cardiology and possible pulm consult upon arrival to [facility] -torsemide, Aldactone, and losartan and Letairis held since last d/c due to soft BPs -she is clinically fluid overloaded although her JVD is partly due to her TR -continue lasix under cardiology direction -personally reviewed CXR--increased interstitial  markings and vascular congestion -continue judicious lasix -12/20/2016 echo EF 55-60%, mild TR, no WMA -April 2017 right catheterization--moderate pulmonary arterial hypertension--59/17  Left leg traumatic hematoma  - Continue compression and icing and elevation -  Holding eliquis since last admission -  Reversed eliquis on 05/11/17 - leg is soft with palpable distal tibial pulse.  Acute on Chronic Respiratory Failure with hypoxia -normally on 2.5L at night -presently stable on 3L  Hypotension,  - lactic acid 0.7 - am cortisol - no fever or leukocytosis - baseline hemoglobin ~8 - Transfused 2 unit PRBC on 05/12/27  Thrombocytopenia -intermittent and chronic -monitor clinically -serial CBC  Atrial fibrillation, rate-controlled a fib on arrival  - CHADS-VASc is 39 (age x2, gender, CAD, CHF)  - Eliquis held due to leg hematona  CAD, Elevated Troponin -Elevated troponin due to CHF decompensation -chest pain-free -Continue Lipitor   CKD stage III -baseline creatinine 0.8-1.1   Disposition Plan:   Admit to stepdown, likely several days Family Communication:   Family at bedside updated  Consultants:  Cardiology called by ED  Code Status:  FULL  DVT Prophylaxis:  SCDs   Procedures: As Listed in Progress Note Above  Antibiotics: None    Subjective: Patient denies fevers, chills, headache, chest pain, dyspnea, nausea, vomiting, diarrhea, abdominal pain, dysuria, hematuria, hematochezia, and melena.   Objective: Vitals:   05/20/17 0800 05/20/17 0830 05/20/17 0900 05/20/17 0953  BP: (!) 68/48 99/63 (!) 106/53 (!) 92/56  Pulse: 85 78  66  Resp: 20 16 17 20   Temp:  98.1 F (36.7 C)    TempSrc:  Oral    SpO2: 97% 97%  99%  Weight:      Height:  Intake/Output Summary (Last 24 hours) at 05/20/17 0958 Last data filed at 06/12/2017 2241  Gross per 24 hour  Intake                3 ml  Output                0 ml  Net                3 ml   Weight  change:  Exam:   General:  Pt is alert, follows commands appropriately, not in acute distress  HEENT: No icterus, No thrush, No neck mass, [place]/AT  Cardiovascular: RRR, S1/S2, no rubs, no gallops  Respiratory: bibasilar crackles, no wheeze  Abdomen: Soft/+BS, non tender, non distended, no guarding  Extremities: 1+LE  edema, No lymphangitis, No petechiae, No rashes, no synovitis   Data Reviewed: I have personally reviewed following labs and imaging studies Basic Metabolic Panel:  Recent Labs Lab 05/14/17 0553 05/15/17 1049 06/06/2017 1522 05/20/17 0502  NA 143 137 139 136  K 4.4 4.8 5.3* 5.1  CL 111 106 104 103  CO2 27 25 28 27   GLUCOSE 99 151* 120* 90  BUN 17 23* 41* 39*  CREATININE 0.81 1.03* 1.14* 0.97  CALCIUM 9.0 9.1 9.4 8.9   Liver Function Tests: No results for input(s): AST, ALT, ALKPHOS, BILITOT, PROT, ALBUMIN in the last 168 hours. No results for input(s): LIPASE, AMYLASE in the last 168 hours. No results for input(s): AMMONIA in the last 168 hours. Coagulation Profile: No results for input(s): INR, PROTIME in the last 168 hours. CBC:  Recent Labs Lab 05/13/17 1509 05/14/17 0553 05/15/17 1049 05/16/17 0505 05/22/2017 1522  WBC 5.9 6.7 8.4  --  7.1  NEUTROABS  --   --   --   --  4.5  HGB 7.9* 7.9* 8.0* 8.0* 7.8*  HCT 25.5* 25.6* 26.3* 25.9* 25.5*  MCV 95.5 95.9 96.0  --  97.7  PLT 89* 94* 126*  --  184   Cardiac Enzymes:  Recent Labs Lab 05/23/2017 1648  TROPONINI 0.06*   BNP: Invalid input(s): POCBNP CBG: No results for input(s): GLUCAP in the last 168 hours. HbA1C: No results for input(s): HGBA1C in the last 72 hours. Urine analysis:    Component Value Date/Time   COLORURINE YELLOW 02/10/2016 1503   APPEARANCEUR CLOUDY (A) 02/10/2016 1503   LABSPEC 1.014 02/10/2016 1503   PHURINE 5.5 02/10/2016 1503   GLUCOSEU NEGATIVE 02/10/2016 1503   HGBUR NEGATIVE 02/10/2016 1503   BILIRUBINUR NEGATIVE 02/10/2016 1503   KETONESUR NEGATIVE  02/10/2016 1503   PROTEINUR NEGATIVE 02/10/2016 1503   NITRITE NEGATIVE 02/10/2016 1503   LEUKOCYTESUR NEGATIVE 02/10/2016 1503   Sepsis Labs: @LABRCNTIP (procalcitonin:4,lacticidven:4) ) Recent Results (from the past 240 hour(s))  MRSA PCR Screening     Status: None   Collection Time: 05/13/17  6:57 AM  Result Value Ref Range Status   MRSA by PCR NEGATIVE NEGATIVE Final    Comment:        The GeneXpert MRSA Assay (FDA approved for NASAL specimens only), is one component of a comprehensive MRSA colonization surveillance program. It is not intended to diagnose MRSA infection nor to guide or monitor treatment for MRSA infections.      Scheduled Meds: . furosemide  40 mg Intravenous BID  . heparin  5,000 Units Subcutaneous Q8H  . sodium chloride flush  3 mL Intravenous Q12H   Continuous Infusions: . sodium chloride      Procedures/Studies: Dg  Tibia/fibula Left  Result Date: 05/10/2017 CLINICAL DATA:  Left knee pain after fall EXAM: LEFT KNEE - COMPLETE 4+ VIEW; LEFT TIBIA AND FIBULA - 2 VIEW COMPARISON:  Left knee radiographs 04/10/2008 FINDINGS: Left knee: Osteopenia limits the study. Moderate patellofemoral degenerative changes. No definite acute displaced fracture or malalignment. Chondrocalcinosis. Moderate-to-marked narrowing of the medial joint space compartment with bony spurring. Small moderate suprapatellar effusion. Masslike soft tissue opacities along the medial aspect of the left knee and the left proximal lower leg. Left tibia fibula: Osteopenia. Vascular calcifications. No acute fracture or malalignment. 8.5 cm oval soft tissue mass medial aspect of the proximal lower leg. IMPRESSION: 1. Osteopenia limits the study 2. No definite acute osseous abnormality at the left knee or left tibia fibula 3. Small to moderate suprapatellar effusion 4. Oval soft tissue masses along the medial aspect of the left knee and the medial aspect of the proximal lower leg, could represent  large hematoma although other soft tissue mass not excluded, clinical correlation is recommended 5. Chondrocalcinosis with moderate to marked degenerative changes of the left knee Electronically Signed   By: Donavan Foil M.D.   On: 05/10/2017 18:47   Dg Chest Port 1 View  Result Date: 05/28/2017 CLINICAL DATA:  Hypotension EXAM: PORTABLE CHEST 1 VIEW COMPARISON:  10/17/2016 FINDINGS: Cardiomegaly with central vascular congestion. No consolidation or large effusion. Hazy bibasilar atelectasis or mild infiltrates. Aortic atherosclerosis. No pneumothorax. IMPRESSION: Cardiomegaly with central vascular congestion. Mild hazy bibasilar atelectasis or infiltrates. Electronically Signed   By: Donavan Foil M.D.   On: 06/16/2017 17:18   Dg Knee Complete 4 Views Left  Result Date: 05/10/2017 CLINICAL DATA:  Left knee pain after fall EXAM: LEFT KNEE - COMPLETE 4+ VIEW; LEFT TIBIA AND FIBULA - 2 VIEW COMPARISON:  Left knee radiographs 04/10/2008 FINDINGS: Left knee: Osteopenia limits the study. Moderate patellofemoral degenerative changes. No definite acute displaced fracture or malalignment. Chondrocalcinosis. Moderate-to-marked narrowing of the medial joint space compartment with bony spurring. Small moderate suprapatellar effusion. Masslike soft tissue opacities along the medial aspect of the left knee and the left proximal lower leg. Left tibia fibula: Osteopenia. Vascular calcifications. No acute fracture or malalignment. 8.5 cm oval soft tissue mass medial aspect of the proximal lower leg. IMPRESSION: 1. Osteopenia limits the study 2. No definite acute osseous abnormality at the left knee or left tibia fibula 3. Small to moderate suprapatellar effusion 4. Oval soft tissue masses along the medial aspect of the left knee and the medial aspect of the proximal lower leg, could represent large hematoma although other soft tissue mass not excluded, clinical correlation is recommended 5. Chondrocalcinosis with moderate  to marked degenerative changes of the left knee Electronically Signed   By: Donavan Foil M.D.   On: 05/10/2017 18:47    Anjanette Gilkey, DO  [facility] Pager [phone]  If 7PM-7AM, please contact night-coverage [email or web address] Password [email or web address] 05/20/2017, 9:58 AM   LOS: 1 day

## 2017-05-21 ENCOUNTER — Telehealth: Payer: Self-pay | Admitting: Emergency Medicine

## 2017-05-21 ENCOUNTER — Inpatient Hospital Stay (HOSPITAL_COMMUNITY): Payer: Medicare Other

## 2017-05-21 DIAGNOSIS — D696 Thrombocytopenia, unspecified: Secondary | ICD-10-CM

## 2017-05-21 DIAGNOSIS — S8012XA Contusion of left lower leg, initial encounter: Secondary | ICD-10-CM

## 2017-05-21 DIAGNOSIS — I480 Paroxysmal atrial fibrillation: Secondary | ICD-10-CM

## 2017-05-21 DIAGNOSIS — N183 Chronic kidney disease, stage 3 (moderate): Secondary | ICD-10-CM

## 2017-05-21 DIAGNOSIS — I361 Nonrheumatic tricuspid (valve) insufficiency: Secondary | ICD-10-CM

## 2017-05-21 DIAGNOSIS — J9621 Acute and chronic respiratory failure with hypoxia: Secondary | ICD-10-CM

## 2017-05-21 LAB — IRON AND TIBC
Iron: 15 ug/dL — ABNORMAL LOW (ref 28–170)
SATURATION RATIOS: 6 % — AB (ref 10.4–31.8)
TIBC: 265 ug/dL (ref 250–450)
UIBC: 250 ug/dL

## 2017-05-21 LAB — ECHOCARDIOGRAM COMPLETE
Ao-asc: 34 cm
CHL CUP MV DEC (S): 239
CHL CUP RV SYS PRESS: 59 mmHg
E decel time: 239 msec
FS: 26 % — AB (ref 28–44)
HEIGHTINCHES: 63 in
IV/PV OW: 0.92
LA ID, A-P, ES: 57 mm
LA diam end sys: 57 mm
LA vol A4C: 106 ml
LA vol index: 45.3 mL/m2
LA vol: 107 mL
LADIAMINDEX: 2.41 cm/m2
LDCA: 3.14 cm2
LV PW d: 12 mm — AB (ref 0.6–1.1)
LVOTD: 20 mm
MV Peak grad: 10 mmHg
MVPKEVEL: 156 m/s
RV TAPSE: 19.5 mm
Reg peak vel: 332 cm/s
TRMAXVEL: 332 cm/s
VTI: 128 cm
WEIGHTICAEL: 4172.8 [oz_av]

## 2017-05-21 LAB — MRSA PCR SCREENING: MRSA BY PCR: NEGATIVE

## 2017-05-21 LAB — RETICULOCYTES
RBC.: 2.46 MIL/uL — ABNORMAL LOW (ref 3.87–5.11)
RETIC CT PCT: 4.4 % — AB (ref 0.4–3.1)
Retic Count, Absolute: 108.2 10*3/uL (ref 19.0–186.0)

## 2017-05-21 LAB — CBC
HEMATOCRIT: 23.2 % — AB (ref 36.0–46.0)
HEMOGLOBIN: 7.3 g/dL — AB (ref 12.0–15.0)
MCH: 29.7 pg (ref 26.0–34.0)
MCHC: 31.5 g/dL (ref 30.0–36.0)
MCV: 94.3 fL (ref 78.0–100.0)
Platelets: 183 10*3/uL (ref 150–400)
RBC: 2.46 MIL/uL — AB (ref 3.87–5.11)
RDW: 14.7 % (ref 11.5–15.5)
WBC: 11.8 10*3/uL — AB (ref 4.0–10.5)

## 2017-05-21 LAB — PREPARE RBC (CROSSMATCH)

## 2017-05-21 LAB — FOLATE: Folate: 10.8 ng/mL (ref >5.9)

## 2017-05-21 LAB — FERRITIN: Ferritin: 65 ng/mL (ref 11–307)

## 2017-05-21 LAB — VITAMIN B12: VITAMIN B 12: 517 pg/mL (ref 180–914)

## 2017-05-21 MED ORDER — SODIUM CHLORIDE 0.9 % IV SOLN: Freq: Once | INTRAVENOUS | Status: DC

## 2017-05-21 MED ORDER — HYDROCODONE-ACETAMINOPHEN 5-325 MG PO TABS
2.0000 | ORAL_TABLET | Freq: Once | ORAL | Status: AC
  Administered 2017-05-21: 2 via ORAL
  Filled 2017-05-21: qty 2

## 2017-05-21 MED ORDER — FUROSEMIDE 10 MG/ML IJ SOLN
40.0000 mg | Freq: Every day | INTRAMUSCULAR | Status: DC
  Administered 2017-05-22 – 2017-05-26 (×4): 40 mg via INTRAVENOUS
  Filled 2017-05-21 (×5): qty 4

## 2017-05-21 MED ORDER — GERHARDT'S BUTT CREAM
TOPICAL_CREAM | Freq: Two times a day (BID) | CUTANEOUS | Status: DC
  Administered 2017-05-21: 22:00:00 via TOPICAL
  Administered 2017-05-22: 1 via TOPICAL
  Administered 2017-05-22 – 2017-05-31 (×10): via TOPICAL
  Administered 2017-06-01: 1 via TOPICAL
  Administered 2017-06-01 – 2017-06-02 (×2): via TOPICAL
  Administered 2017-06-02: 1 via TOPICAL
  Administered 2017-06-03: 22:00:00 via TOPICAL
  Administered 2017-06-03: 1 via TOPICAL
  Administered 2017-06-06: 22:00:00 via TOPICAL
  Administered 2017-06-06: 1 via TOPICAL
  Administered 2017-06-07 (×2): via TOPICAL
  Administered 2017-06-08: 1 via TOPICAL
  Administered 2017-06-08 – 2017-06-11 (×6): via TOPICAL
  Administered 2017-06-11 – 2017-06-12 (×3): 1 via TOPICAL
  Administered 2017-06-13 – 2017-06-17 (×9): via TOPICAL
  Filled 2017-05-21 (×5): qty 1

## 2017-05-21 MED ORDER — AMBRISENTAN 5 MG PO TABS: 5.0000 mg | ORAL_TABLET | Freq: Every day | ORAL | 11 refills | Status: AC

## 2017-05-21 NOTE — Plan of Care (Signed)
Problem: Education: Goal: Knowledge of [facility] Education information/materials will improve Outcome: Progressing Patient aware of plan of care.     

## 2017-05-21 NOTE — Progress Notes (Signed)
RN and nurse tech assisted patient from bedside commode back to bed, moderate amount of dark blood drained from lower portion of patient's dressing on left leg.  Back in bed, left leg elevated, no further bleeding noted.

## 2017-05-21 NOTE — Telephone Encounter (Signed)
Pt needs refill on letairis.  This has been sent to preferred pharmacy.  Nothing further needed.

## 2017-05-21 NOTE — Progress Notes (Signed)
Patient's axillary temperature is 100.5 degrees Farenheit.  Took temperature axillary due to patient just drinking cold water.  Patient also complaining of 10/10 throbbing pain to left lower extremity.  Patient had Norco 5/325 at [facility], ordered three times daily PRN.  [facility] paged with this information.

## 2017-05-21 NOTE — Progress Notes (Signed)
PROGRESS NOTE    [name]  TIR:[id number] DOB: 09/14/1934 DOA: 06/16/2017 PCP: Redmond School, MD   Brief Narrative:  82 y.o. WF PMHx MI, Chronic Atrial Fibrillation, Chronic Diastolic CHF, Pulmonary HTN, severe Tricuspid Valve Regurgitation, chronic respiratory failure) on 2.5 L O2 at night) , CKD stage III   recent left traumatic left leg hematoma complicated by hypovolemic shock and acute blood loss anemia  Presenting with acute on chronic diastolic CHF exacerbation as well as hypertension and elevated troponin. Patient noted to have been recently admitted September 22 to September 28 for left leg traumatic hematoma with noted hypovolemic shock and acute blood loss anemia. Patient was noted to have been transferred to a rehabilitation facility after hospitalization. Per report, patient with progressive weakness since being placed within the rehabilitation facility. No reported worsening of leg hematoma though patient has had progressive edema. No reported fevers or chills. No chest pain. No reported orthopnea or PND. Has been compliant with medication regimen. No reported high salt intake.  ED Course: Presented to the ER afebrile with noted systolic blood pressures in 90s to 100s. Creatinine 1.14, potassium 5.3. Troponin of 0.06. BNP is 748. Chest x-ray with cardiomegaly and central vascular congestion mild hazy bibasilar basilar atelectasis versus infiltrates.    Subjective: 10/3 A/O 4, negative CP, positive chronic SOB, negative abdominal pain, negative N/V. States she was walking with cane in the house grabbed hold of a doorknob as she was moving to another room and believes she tripped. Unsure of what she struck when she fell negative LOC. States uses a cane and a Rollator at home for ambulation.   Assessment & Plan:   Active Problems:   Acute on chronic diastolic CHF (congestive heart failure) (HCC)   Hypotension   Elevated troponin  Acute on Chronic Diastolic  CHF -Intermittent episodes hypotension -Per EMR notes Cardiology/possible Pulmonary consult upon arrival to [facility]. No notes in chart from cardiology/pulmonology services.  -Patient remains borderline hypotensive continue to hold furosemide, Aldactone, losartan, and Letairis which were held at patient's last discharge 9/28 secondary to hypotension. -Strict in and out -Daily weight -10/3 Decrease Lasix 40 mg BID--> daily secondary to hypotension -Echocardiogram pending -Transfuse for hemoglobin<8 -10/3 transfuse 1 unit PRBC  Pulmonary HTN -April 2017 RIGHT heart catheterization PAH; 59/17 mmHg -See CHF  Paroxysmal Atrial fibrillation,(CHADS-VASC 5)  -Rate controlled, currently in NSR   -Eliquis held secondary to large LLE hematoma   CAD, Elevated Troponin -Elevated troponin due to CHF decompensation -chest pain-free -Continue Lipitor   Hypotension -Borderline hypotension -MAP goal> 65       LEFT LEG Hematoma -Hold Eliquis since last admission: NOTE Eliquis reversed on 05/11/2017 -Patient with large hematoma several areas of skin breakdown with eschar formation see picture above. -Foot warm, palpable DP/PT pulse -Will Reconsult Orthopedic surgery on 10/4, surgical debridement? Orthopedic surgery evaluated patient on previous admission from 9/22-9/28.  Acute on chronic respiratory failure with hypoxia -Resolved -Currently on 2.5 L via [place] (patient's home dose) -Titrate O2 to maintain SPO2> 93%  Thrombocytopenia -intermittent and chronic: Currently resolved -monitor clinically  Anemia (baselineHgB~9-10) -Anemia panel pending     CKD stage III -baseline creatinine 0.8-1.1    DVT prophylaxis: None secondary to large hematoma Code Status: Full Family Communication: Husband and daughter bedside for discussion of plan of care Disposition Plan: TBD   Consultants:  Orthopedic surgery previous admission 9/22-9/28    Procedures/Significant Events:  10/3  Echocardiogram   I have personally reviewed and interpreted all  radiology studies and my findings are as above.  VENTILATOR SETTINGS:    Cultures None  Antimicrobials: Anti-infectives    None       Devices    LINES / TUBES:      Continuous Infusions: . sodium chloride       Objective: Vitals:   05/20/17 1729 05/20/17 1954 05/20/17 2347 05/21/17 0410  BP: (!) 107/52 (!) 112/55 (!) 96/50 (!) 112/48  Pulse: 81 85 (!) 101 85  Resp: 15 14 20 18   Temp: 99.1 F (37.3 C) 98.6 F (37 C) 97.9 F (36.6 C) (!) 100.5 F (38.1 C)  TempSrc: Oral Oral Oral Axillary  SpO2: 95% 96% 96% 97%  Weight: 260 lb 12.8 oz (118.3 kg)     Height: 5\' 3"  (1.6 m)       Intake/Output Summary (Last 24 hours) at 05/21/17 0820 Last data filed at 05/21/17 0457  Gross per 24 hour  Intake              440 ml  Output             1100 ml  Net             -660 ml   Filed Weights   05/28/2017 1449 05/20/17 1729  Weight: 224 lb (101.6 kg) 260 lb 12.8 oz (118.3 kg)    Examination:  General: A/O 4, positive chronic respiratory distress Eyes: negative scleral hemorrhage, negative anisocoria, negative icterus Lungs: Clear to auscultation bilaterally without wheezes or crackles Cardiovascular: Regular rate and rhythm without murmur gallop or rub normal S1 and S2 Abdomen: MORBIDLY OBESE, negative abdominal pain, nondistended, positive soft, bowel sounds, no rebound, no ascites, no appreciable mass Extremities: No significant cyanosis, clubbing. LLE moderate-severe hematoma with multiple areas of skin breakdown and eschar see picture above, tender to palpation. DP/PT pulse palpable bilateral  Psychiatric:  Negative depression, negative anxiety, negative fatigue, negative mania  Central nervous system:  Cranial nerves II through XII intact, tongue/uvula midline, all extremities muscle strength 5/5, sensation intact throughout, negative dysarthria, negative expressive aphasia, negative receptive  aphasia.  .     Data Reviewed: Care during the described time interval was provided by me .  I have reviewed this patient's available data, including medical history, events of note, physical examination, and all test results as part of my evaluation.   CBC:  Recent Labs Lab 05/15/17 1049 05/16/17 0505 06/14/2017 1522  WBC 8.4  --  7.1  NEUTROABS  --   --  4.5  HGB 8.0* 8.0* 7.8*  HCT 26.3* 25.9* 25.5*  MCV 96.0  --  97.7  PLT 126*  --  009   Basic Metabolic Panel:  Recent Labs Lab 05/15/17 1049 06/01/2017 1522 05/20/17 0502  NA 137 139 136  K 4.8 5.3* 5.1  CL 106 104 103  CO2 25 28 27   GLUCOSE 151* 120* 90  BUN 23* 41* 39*  CREATININE 1.03* 1.14* 0.97  CALCIUM 9.1 9.4 8.9   GFR: Estimated Creatinine Clearance: 57.5 mL/min (by C-G formula based on SCr of 0.97 mg/dL). Liver Function Tests: No results for input(s): AST, ALT, ALKPHOS, BILITOT, PROT, ALBUMIN in the last 168 hours. No results for input(s): LIPASE, AMYLASE in the last 168 hours. No results for input(s): AMMONIA in the last 168 hours. Coagulation Profile: No results for input(s): INR, PROTIME in the last 168 hours. Cardiac Enzymes:  Recent Labs Lab 06/03/2017 1648  TROPONINI 0.06*   BNP (last 3 results) No results for  input(s): PROBNP in the last 8760 hours. HbA1C: No results for input(s): HGBA1C in the last 72 hours. CBG: No results for input(s): GLUCAP in the last 168 hours. Lipid Profile: No results for input(s): CHOL, HDL, LDLCALC, TRIG, CHOLHDL, LDLDIRECT in the last 72 hours. Thyroid Function Tests: No results for input(s): TSH, T4TOTAL, FREET4, T3FREE, THYROIDAB in the last 72 hours. Anemia Panel: No results for input(s): VITAMINB12, FOLATE, FERRITIN, TIBC, IRON, RETICCTPCT in the last 72 hours. Urine analysis:    Component Value Date/Time   COLORURINE YELLOW 02/10/2016 1503   APPEARANCEUR CLOUDY (A) 02/10/2016 1503   LABSPEC 1.014 02/10/2016 1503   PHURINE 5.5 02/10/2016 1503    GLUCOSEU NEGATIVE 02/10/2016 1503   HGBUR NEGATIVE 02/10/2016 1503   BILIRUBINUR NEGATIVE 02/10/2016 1503   KETONESUR NEGATIVE 02/10/2016 1503   PROTEINUR NEGATIVE 02/10/2016 1503   NITRITE NEGATIVE 02/10/2016 1503   LEUKOCYTESUR NEGATIVE 02/10/2016 1503   Sepsis Labs: @LABRCNTIP (procalcitonin:4,lacticidven:4)  ) Recent Results (from the past 240 hour(s))  MRSA PCR Screening     Status: None   Collection Time: 05/13/17  6:57 AM  Result Value Ref Range Status   MRSA by PCR NEGATIVE NEGATIVE Final    Comment:        The GeneXpert MRSA Assay (FDA approved for NASAL specimens only), is one component of a comprehensive MRSA colonization surveillance program. It is not intended to diagnose MRSA infection nor to guide or monitor treatment for MRSA infections.          Radiology Studies: Dg Chest Port 1 View  Result Date: 06/02/2017 CLINICAL DATA:  Hypotension EXAM: PORTABLE CHEST 1 VIEW COMPARISON:  10/17/2016 FINDINGS: Cardiomegaly with central vascular congestion. No consolidation or large effusion. Hazy bibasilar atelectasis or mild infiltrates. Aortic atherosclerosis. No pneumothorax. IMPRESSION: Cardiomegaly with central vascular congestion. Mild hazy bibasilar atelectasis or infiltrates. Electronically Signed   By: Donavan Foil M.D.   On: 06/06/2017 17:18        Scheduled Meds: . brimonidine  1 drop Right Eye BID  . brinzolamide  1 drop Both Eyes BID  . furosemide  40 mg Intravenous BID  . heparin  5,000 Units Subcutaneous Q8H  . latanoprost  1 drop Right Eye QHS  . sodium chloride flush  3 mL Intravenous Q12H  . timolol  1 drop Both Eyes BID   Continuous Infusions: . sodium chloride       LOS: 2 days    Time spent: 40 minutes    WOODS, Geraldo Docker, MD [facility] Pager [phone]   If 7PM-7AM, please contact night-coverage [email or web address] Password [email or web address] 05/21/2017, 8:20 AM

## 2017-05-21 NOTE — Plan of Care (Signed)
Problem: Safety: Goal: Ability to remain free from injury will improve Outcome: Progressing RN instructed patient to call and wait for staff assistance prior to getting out of bed.  Patient stated understanding and has made no attempts to get out of bed unassisted thus far this shift.     

## 2017-05-21 NOTE — Consult Note (Addendum)
   [facility] CM Inpatient Consult   05/21/2017  LAURA CALDAS 16-Jul-1935 [id number]     Made aware of hospitalization by Telephonic RNCM. Mrs. Tanguma is active with [facility].  She is followed by [facility] LCSW and [facility] Telephonic RNCM.  Went to bedside to speak with [name]. She endorses she is from home and she fell. States she thinks she will need ST-SNF at discharge. States she prefers [facility].  She is agreeable to ongoing [facility] follow up.  Spoke with inpatient RNCM to make aware of above.   Marthenia Rolling, MSN-Ed, RN,BSN [facility] Liaison [phone]

## 2017-05-21 NOTE — Progress Notes (Signed)
  Echocardiogram 2D Echocardiogram has been performed.  [name] 05/21/2017, 11:57 AM

## 2017-05-21 NOTE — Progress Notes (Signed)
Bed scale inaccurate, unable to obtain patient's weight at this time.

## 2017-05-21 NOTE — Consult Note (Addendum)
[facility] Nurse wound consult note Reason for Consult:LE wounds, sacrum, skin folds Wound type: Evolving hematomas LLE, was just admitted for same. Now further evolution with eschar  MASD (moisture associated skin damage) buttocks, patient is incontinent of urine ITD (intertriginous skin damage) under her pannus Pressure Injury POA: NA Measurement: LLE: proximal 7cm x 8cm x 0; 100% tightly adherent eschar LLE: medial 9cm x 14cm x 0.1cm; 80% tightly adherent eschar/20% purple,pink moist LLE: distal 6cm x 12cm 100% dark purple hematoma with recent unroofing and drainage of old blood Wound bed: see above Drainage (amount, consistency, odor) dark, old blood  Periwound: 3+ edema, erythema surround especially the proximal wound; extensive bruising noted  Dressing procedure/placement/frequency: Continue xeroform as nonadherent, however I feel surgery needs to re-evaluate patient. Eschar will continue to extend, distal hematoma now that it is un roofed will evolve to eschar in my experience.  I will ask hospitalist to consult orthopedic surgeon. Antimicrobial wicking fabric under her pannus to address ITD Gerhardt's butt cream to the buttocks each shift and PRN after each episode of incontinence  Bariatric LALM in place for moisture management and pressure redistribution.   If orthopedic evaluation not completed, will definitely need follow up in a wound care center of her choice after DC.  Discussed POC with patient and bedside nurse.  Re consult if needed, will not follow at this time. Thanks  Dionte Blaustein [employer], RN,CWOCN, CNS, [place] [phone])

## 2017-05-21 NOTE — Evaluation (Signed)
Physical Therapy Evaluation Patient Details Name: [name] MRN: [id number] DOB: 12-04-1934 Today's Date: 05/21/2017   History of Present Illness  Patient is an 82 yo female admitted 05/20/2017 from [facility] with hypotension, CHF exac, elevated troponin.  Recent hosp admit after fall with injury to LLE.     PMH:  Afib, CHF, CKD, HTN, arthritis, CAD, MI, obesity.  Clinical Impression  Patient presents with problems listed below.  Will benefit from acute PT to maximize functional mobility prior to patient's return to SNF for continued therapy.    Follow Up Recommendations SNF;Supervision/Assistance - 24 hour    Equipment Recommendations  None recommended by PT    Recommendations for Other Services       Precautions / Restrictions Precautions Precautions: Fall Restrictions Weight Bearing Restrictions: No      Mobility  Bed Mobility Overal bed mobility: Needs Assistance Bed Mobility: Rolling Rolling: Max assist;+2 for physical assistance         General bed mobility comments: Assist at shoulder for patient to reach for opposite rail.  Assist to bring trunk onto side, with assist to bring LE into position.  Transfers                 General transfer comment: NT  Ambulation/Gait                Stairs            Wheelchair Mobility    Modified Rankin (Stroke Patients Only)       Balance                                             Pertinent Vitals/Pain Pain Assessment: Faces Faces Pain Scale: Hurts even more Pain Location: LLE; also complains of lesser pain in RLE with movement Pain Descriptors / Indicators: Aching;Grimacing;Guarding;Sore;Tender Pain Intervention(s): Limited activity within patient's tolerance;Monitored during session;Repositioned    Home Living Family/patient expects to be discharged to:: Skilled nursing facility Living Arrangements: Spouse/significant other                     Prior Function Level of Independence: Independent with assistive device(s) (Prior to recent fall.)         Comments: used rollator at home prior to recent fall with knee injury.     Hand Dominance   Dominant Hand: Right    Extremity/Trunk Assessment   Upper Extremity Assessment Upper Extremity Assessment: Defer to OT evaluation    Lower Extremity Assessment Lower Extremity Assessment: RLE deficits/detail;LLE deficits/detail RLE Deficits / Details: Strength grossly 3-/5 with hip and knee movement.  Patient reports pain with movement. RLE Coordination: decreased gross motor LLE Deficits / Details: Patient with knee injury/wound from fall prior to previous admit.  Strength grossly 2-/5 at hip and knee, and 3/5 at ankle.  Full ROM for knee extension.  Unable to test knee flexion today. LLE Coordination: decreased gross motor       Communication   Communication: No difficulties  Cognition Arousal/Alertness: Lethargic;Awake/alert (Initially very lethargic, falling asleep. More alert with ti) Behavior During Therapy: [facility] for tasks assessed/performed;Anxious Overall Cognitive Status: Within Functional Limits for tasks assessed  General Comments General comments (skin integrity, edema, etc.): Large wound LLE    Exercises General Exercises - Lower Extremity Ankle Circles/Pumps: AROM;Both;10 reps;Supine   Assessment/Plan    PT Assessment Patient needs continued PT services  PT Problem List Pain;Decreased knowledge of use of DME;Decreased balance;Decreased strength;Decreased activity tolerance;Decreased mobility;Cardiopulmonary status limiting activity;Obesity       PT Treatment Interventions Gait training;DME instruction;Therapeutic activities;Therapeutic exercise;Functional mobility training;Patient/family education;Balance training    PT Goals (Current goals can be found in the Care Plan section)  Acute Rehab PT  Goals Patient Stated Goal: None stated PT Goal Formulation: With patient Time For Goal Achievement: 06/07/2017 Potential to Achieve Goals: Fair    Frequency Min 3X/week   Barriers to discharge        Co-evaluation               AM-PAC PT "6 Clicks" Daily Activity  Outcome Measure Difficulty turning over in bed (including adjusting bedclothes, sheets and blankets)?: Unable Difficulty moving from lying on back to sitting on the side of the bed? : Unable Difficulty sitting down on and standing up from a chair with arms (e.g., wheelchair, bedside commode, etc,.)?: Unable Help needed moving to and from a bed to chair (including a wheelchair)?: A Lot Help needed walking in hospital room?: A Lot Help needed climbing 3-5 steps with a railing? : Total 6 Click Score: 8    End of Session Equipment Utilized During Treatment: Gait belt Activity Tolerance: Patient limited by pain;Patient limited by fatigue Patient left: in bed;with call bell/phone within reach;with family/visitor present   PT Visit Diagnosis: Other abnormalities of gait and mobility (R26.89);Muscle weakness (generalized) (M62.81);History of falling (Z91.81);Pain Pain - Right/Left: Left Pain - part of body: Leg    Time: 0109-3235 PT Time Calculation (min) (ACUTE ONLY): 19 min   Charges:   PT Evaluation $PT Eval Moderate Complexity: 1 Mod     PT G Codes:        Carita Pian. Sanjuana Kava, [facility] Acute Rehab Services Pager [place] 05/21/2017, 4:28 PM

## 2017-05-22 DIAGNOSIS — I70262 Atherosclerosis of native arteries of extremities with gangrene, left leg: Secondary | ICD-10-CM

## 2017-05-22 DIAGNOSIS — D473 Essential (hemorrhagic) thrombocythemia: Secondary | ICD-10-CM

## 2017-05-22 DIAGNOSIS — I5033 Acute on chronic diastolic (congestive) heart failure: Secondary | ICD-10-CM

## 2017-05-22 LAB — CBC WITH DIFFERENTIAL/PLATELET
BASOS ABS: 0 10*3/uL (ref 0.0–0.1)
Basophils Relative: 0 %
Eosinophils Absolute: 0.1 10*3/uL (ref 0.0–0.7)
Eosinophils Relative: 1 %
HEMATOCRIT: 26.3 % — AB (ref 36.0–46.0)
Hemoglobin: 8.3 g/dL — ABNORMAL LOW (ref 12.0–15.0)
LYMPHS PCT: 10 %
Lymphs Abs: 1 10*3/uL (ref 0.7–4.0)
MCH: 29.5 pg (ref 26.0–34.0)
MCHC: 31.6 g/dL (ref 30.0–36.0)
MCV: 93.6 fL (ref 78.0–100.0)
Monocytes Absolute: 1 10*3/uL (ref 0.1–1.0)
Monocytes Relative: 10 %
NEUTROS ABS: 8.1 10*3/uL — AB (ref 1.7–7.7)
Neutrophils Relative %: 79 %
Platelets: 204 10*3/uL (ref 150–400)
RBC: 2.81 MIL/uL — AB (ref 3.87–5.11)
RDW: 15.3 % (ref 11.5–15.5)
WBC: 10.2 10*3/uL (ref 4.0–10.5)

## 2017-05-22 LAB — BASIC METABOLIC PANEL
ANION GAP: 7 (ref 5–15)
BUN: 26 mg/dL — ABNORMAL HIGH (ref 6–20)
CALCIUM: 8.7 mg/dL — AB (ref 8.9–10.3)
CHLORIDE: 98 mmol/L — AB (ref 101–111)
CO2: 33 mmol/L — ABNORMAL HIGH (ref 22–32)
CREATININE: 0.92 mg/dL (ref 0.44–1.00)
GFR calc non Af Amer: 57 mL/min — ABNORMAL LOW (ref >60)
Glucose, Bld: 91 mg/dL (ref 65–99)
Potassium: 3.7 mmol/L (ref 3.5–5.1)
SODIUM: 138 mmol/L (ref 135–145)

## 2017-05-22 LAB — HEMOGLOBIN AND HEMATOCRIT, BLOOD
HCT: 26 % — ABNORMAL LOW (ref 36.0–46.0)
HEMOGLOBIN: 7.8 g/dL — AB (ref 12.0–15.0)

## 2017-05-22 LAB — PREPARE RBC (CROSSMATCH)

## 2017-05-22 LAB — MAGNESIUM: MAGNESIUM: 1.5 mg/dL — AB (ref 1.7–2.4)

## 2017-05-22 LAB — PROTIME-INR
INR: 1.25
Prothrombin Time: 15.6 seconds — ABNORMAL HIGH (ref 11.4–15.2)

## 2017-05-22 LAB — TROPONIN I: Troponin I: 0.06 ng/mL (ref <0.03)

## 2017-05-22 MED ORDER — SODIUM CHLORIDE 0.9 % IV SOLN
Freq: Once | INTRAVENOUS | Status: AC
  Administered 2017-05-22: 15:00:00 via INTRAVENOUS

## 2017-05-22 MED ORDER — MIDODRINE HCL 5 MG PO TABS
5.0000 mg | ORAL_TABLET | Freq: Three times a day (TID) | ORAL | Status: DC
  Administered 2017-05-22 – 2017-05-28 (×15): 5 mg via ORAL
  Filled 2017-05-22 (×18): qty 1

## 2017-05-22 MED ORDER — MAGNESIUM SULFATE 2 GM/50ML IV SOLN
2.0000 g | Freq: Once | INTRAVENOUS | Status: AC
Start: 2017-05-22 — End: 2017-05-22
  Administered 2017-05-22: 2 g via INTRAVENOUS
  Filled 2017-05-22: qty 50

## 2017-05-22 NOTE — Plan of Care (Signed)
Problem: Activity: Goal: Risk for activity intolerance will decrease Outcome: Progressing Patient resting in bed. No s/s's of blood transfusion reaction post 1 unit of blood. Patient in low airloss bed that is rotating from left to right every 30 minutes. Patient a-fib 70s on tele. 2.5 liter nasal canula. Pain adequately controlled with PRN medications. Patients LLE with palpable pulse. Dressing changed 05/21/17 on dayshift, gauze dressing CDI.

## 2017-05-22 NOTE — Clinical Social Work Note (Signed)
Clinical Social Work Assessment  Patient Details  Name: [name] MRN: [id number] Date of Birth: June 05, 1935  Date of referral:  05/22/17               Reason for consult:  Facility Placement, Discharge Planning                Permission sought to share information with:  Family Supports, Customer service manager Permission granted to share information::  Yes, Verbal Permission Granted  Name::     [name]  Agency::  SNFs  Relationship::  spouse  Contact Information:  [phone]  Housing/Transportation Living arrangements for the past 2 months:  [place], [place] of Information:  Patient, Facility, Spouse Patient Interpreter Needed:  None Criminal Activity/Legal Involvement Pertinent to Current Situation/Hospitalization:  No - Comment as needed Significant Relationships:  Spouse, Adult Children Lives with:  Spouse Do you feel safe going back to the place where you live?  Yes Need for family participation in patient care:  Yes (Comment)  Care giving concerns: Patient from [facility], was there for a few days after recent hospital admission. Continues to need SNF, but declines to return to [facility].  Social Worker assessment / plan: CSW met with patient and spouse at bedside. Patient and spouse adamantly against patient returning to [facility]; prefer [facility] or [facility]. CSW sent out referrals to [facility], [facility]. Patient also refusing [facility]. CSW called [facility] to follow up on pending referral; [facility] reviewing for possible bed offer. CSW advised that if there is no accepting facility in the requested geographic area, they may need to consider [facility]; patient and spouse agreeable to send referrals to [facility] to follow and support with placement.  Employment status:  Retired Forensic scientist:  Medicare PT Recommendations:  [place] / Referral  to community resources:  [place]  Patient/Family's Response to care:  Patient and spouse appreciative of care.  Patient/Family's Understanding of and Emotional Response to Diagnosis, Current Treatment, and Prognosis: Patient and spouse with understanding of patient's condition and hopeful for SNF placement at a new SNF.  Emotional Assessment Appearance:  Appears stated age Attitude/Demeanor/Rapport:  Other (appropriate) Affect (typically observed):  Pleasant Orientation:  Oriented to Self, Oriented to Situation, Oriented to Place, Oriented to  Time Alcohol / Substance use:  Not Applicable Psych involvement (Current and /or in the community):  No (Comment)  Discharge Needs  Concerns to be addressed:  Discharge Planning Concerns Readmission within the last 30 days:  Yes Current discharge risk:  Physical Impairment, Dependent with Mobility Barriers to Discharge:  Continued Medical Work up   Estanislado Emms, LCSW 05/22/2017, 4:20 PM

## 2017-05-22 NOTE — NC FL2 (Signed)
[place] MEDICAID FL2 LEVEL OF CARE SCREENING TOOL     IDENTIFICATION  Patient Name: [name] Birthdate: 01-12-1935 Sex: female Admission Date (Current Location): 06/05/2017  [facility] and [place] Number:  [employer] and Address:  The [place]. [facility], [place] [address], [place], [place] [address]      Provider Number: [id number]  Attending Physician Name and Address:  Allie Bossier, MD  Relative Name and Phone Number:  [name], spouse, [phone]    Current Level of Care: Hospital Recommended Level of Care: [place] Prior Approval Number:    Date Approved/Denied:   PASRR Number: [id number]  Discharge Plan: SNF    Current Diagnoses: Patient Active Problem List   Diagnosis Date Noted  . Anemia   . Elevated troponin 06/13/2017  . Hypotension   . Acute blood loss anemia 05/11/2017  . CKD (chronic kidney disease), stage III ([place]) 05/10/2017  . Thigh hematoma, left, initial encounter 05/10/2017  . Leg hematoma, left, initial encounter 05/10/2017  . Chronic respiratory failure ([place]) 07/22/2016  . Chronic rhinitis 02/08/2016  . Acute on chronic diastolic CHF (congestive heart failure) ([place]) 12/28/2015  . PAH (pulmonary artery hypertension) ([place])   . CHF (congestive heart failure) ([place]) 11/17/2015  . Acute congestive heart failure ([place]) 11/17/2015  . Pulmonary nodules 10/07/2013  . Encounter for therapeutic drug monitoring 09/22/2013  . DOE (dyspnea on exertion) 05/26/2012  . Psoriasis   . Carotid bruit   . Warfarin anticoagulation   . CAD (coronary artery disease)   . Pulmonary hypertension ([place]) 04/19/2011  . Atrial fibrillation ([place]) 11/16/2010  . GERD 04/17/2010  . COLONIC POLYPS, HX OF 04/17/2010  . Hyperlipidemia 04/17/2009  . Overweight(278.02) 04/17/2009  . Essential hypertension 04/17/2009    Orientation RESPIRATION BLADDER Height & Weight     Self, Time, Situation, Place  Normal  Incontinent, External catheter Weight: 226 lb (102.5 kg) Height:  5\' 3"  (160 cm)  BEHAVIORAL SYMPTOMS/MOOD NEUROLOGICAL BOWEL NUTRITION STATUS      Incontinent Diet (please see DC summary)  AMBULATORY STATUS COMMUNICATION OF NEEDS Skin   Extensive Assist Verbally PU Stage and Appropriate Care (PU coccyx, foam dressing; L thigh wound, ABD pads)                       Personal Care Assistance Level of Assistance  Bathing, Feeding, Dressing Bathing Assistance: Maximum assistance Feeding assistance: Independent Dressing Assistance: Maximum assistance     Functional Limitations Info  Sight, Hearing, Speech Sight Info: Adequate (eyeglasses) Hearing Info: Adequate Speech Info: Adequate    SPECIAL CARE FACTORS FREQUENCY  PT (By licensed PT), OT (By licensed OT)     PT Frequency: 5x/week OT Frequency: 5x/week            Contractures Contractures Info: Not present    Additional Factors Info  Code Status, Allergies Code Status Info: Full Allergies Info: No Known Allergies           Current Medications (05/22/2017):  This is the current hospital active medication list Current Facility-Administered Medications  Medication Dose Route Frequency Provider Last Rate Last Dose  . 0.9 %  sodium chloride infusion  250 mL Intravenous PRN Deneise Lever, MD      . 0.9 %  sodium chloride infusion   Intravenous Once Allie Bossier, MD      . acetaminophen (TYLENOL) tablet 650 mg  650 mg Oral Q4H PRN Deneise Lever, MD  650 mg at 05/20/17 1856  . brimonidine (ALPHAGAN) 0.2 % ophthalmic solution 1 drop  1 drop Right Eye BID Schorr, Rhetta Mura, NP   1 drop at 05/22/17 0912  . brinzolamide (AZOPT) 1 % ophthalmic suspension 1 drop  1 drop Both Eyes BID Schorr, Rhetta Mura, NP   1 drop at 05/22/17 0913  . furosemide (LASIX) injection 40 mg  40 mg Intravenous Daily Allie Bossier, MD   40 mg at 05/22/17 0910  . Gerhardt's butt cream   Topical BID Allie Bossier, MD   1 application at  19/62/22 0915  . HYDROcodone-acetaminophen (NORCO/VICODIN) 5-325 MG per tablet 1 tablet  1 tablet Oral TID PRN Schorr, Rhetta Mura, NP   1 tablet at 05/22/17 0023  . latanoprost (XALATAN) 0.005 % ophthalmic solution 1 drop  1 drop Right Eye QHS Schorr, Rhetta Mura, NP   1 drop at 05/22/17 0026  . ondansetron (ZOFRAN) injection 4 mg  4 mg Intravenous Q6H PRN Deneise Lever, MD      . sodium chloride flush (NS) 0.9 % injection 3 mL  3 mL Intravenous Q12H Deneise Lever, MD   3 mL at 05/22/17 0910  . sodium chloride flush (NS) 0.9 % injection 3 mL  3 mL Intravenous PRN Deneise Lever, MD   3 mL at 05/20/17 0830  . timolol (TIMOPTIC) 0.5 % ophthalmic solution 1 drop  1 drop Both Eyes BID Schorr, Rhetta Mura, NP   1 drop at 05/22/17 9798     Discharge Medications: Please see discharge summary for a list of discharge medications.  Relevant Imaging Results:  Relevant Lab Results:   Additional Information SSN: [id number]  Estanislado Emms, LCSW

## 2017-05-22 NOTE — Evaluation (Addendum)
Occupational Therapy Evaluation Patient Details Name: [name] MRN: [id number] DOB: 07-01-1935 Today's Date: 05/22/2017    History of Present Illness Patient is an 82 yo female admitted 05/23/2017 from [facility] with hypotension, CHF exac, elevated troponin.  Recent hosp admit after fall with injury to LLE.     PMH:  Afib, CHF, CKD, HTN, arthritis, CAD, MI, obesity.   Clinical Impression   Limited evaluation due to L LE bleeding earlier today. Presents with generalized weakness. Prior to pt's recent fall, she was able to perform self care and ambulate with a rollator. Pt educated in UE exercises with level 2 theraband and instructed to perform x 3/day. She currently needs extensive assistance and will need post acute rehab in SNF prior to return home. Will follow acutely.    Follow Up Recommendations  SNF;Supervision/Assistance - 24 hour    Equipment Recommendations       Recommendations for Other Services       Precautions / Restrictions Precautions Precautions: Fall Restrictions Weight Bearing Restrictions: No      Mobility Bed Mobility Overal bed mobility: Needs Assistance Bed Mobility: Rolling Rolling: Total assist         General bed mobility comments: assist for all aspects  Transfers                 General transfer comment: NT    Balance                                           ADL either performed or assessed with clinical judgement   ADL Overall ADL's : Needs assistance/impaired Eating/Feeding: Set up;Bed level   Grooming: Set up;Bed level   Upper Body Bathing: Maximal assistance;Bed level   Lower Body Bathing: Total assistance;Bed level   Upper Body Dressing : Maximal assistance;Bed level   Lower Body Dressing: Total assistance;Bed level                 General ADL Comments: mobility limited to bed level due to bleeding L LE earlier     Vision Baseline Vision/History: Wears  glasses;Glaucoma;Cataracts Wears Glasses: At all times Patient Visual Report: No change from baseline       Perception     Praxis      Pertinent Vitals/Pain Pain Assessment: Faces Faces Pain Scale: Hurts even more Pain Location: L LE Pain Descriptors / Indicators: Grimacing;Guarding Pain Intervention(s): Repositioned;Limited activity within patient's tolerance     Hand Dominance Right   Extremity/Trunk Assessment Upper Extremity Assessment Upper Extremity Assessment: Generalized weakness   Lower Extremity Assessment Lower Extremity Assessment: Defer to PT evaluation       Communication Communication Communication: No difficulties   Cognition Arousal/Alertness: Awake/alert Behavior During Therapy: WFL for tasks assessed/performed Overall Cognitive Status: Within Functional Limits for tasks assessed                                     General Comments       Exercises Exercises: Other exercises Other Exercises Other Exercises: shoulder horizontal abduction B x 15 in supine level 2 Other Exercises: diagonal shoulder exercises x 15 level 2 theraband   Shoulder Instructions      Home Living Family/patient expects to be discharged to:: Skilled nursing facility Living Arrangements: Spouse/significant other  Prior Functioning/Environment Level of Independence: Independent with assistive device(s) (prior to recent fall)        Comments: used rollator at home prior to recent fall with knee injury.        OT Problem List: Decreased strength;Impaired balance (sitting and/or standing);Pain;Decreased knowledge of use of DME or AE      OT Treatment/Interventions: DME and/or AE instruction;Balance training;Patient/family education;Self-care/ADL training;Therapeutic exercise    OT Goals(Current goals can be found in the care plan section) Acute Rehab OT Goals Patient Stated Goal: None stated OT Goal  Formulation: With patient Time For Goal Achievement: 06/05/17 Potential to Achieve Goals: Good ADL Goals Pt Will Perform Grooming: with supervision;sitting (at sink) Pt Will Perform Upper Body Dressing: with supervision;sitting Pt Will Perform Lower Body Dressing: with mod assist;sit to/from stand Pt Will Transfer to Toilet: with mod assist;bedside commode;squat pivot transfer Pt Will Perform Toileting - Clothing Manipulation and hygiene: sit to/from stand;with mod assist Pt/caregiver will Perform Home Exercise Program: Increased strength;Both right and left upper extremity;With theraband;Independently Additional ADL Goal #1: Pt will perform bed mobility with mod assist in preparation for ADL.  OT Frequency: Min 2X/week   Barriers to D/C:            Co-evaluation              AM-PAC PT "6 Clicks" Daily Activity     Outcome Measure Help from another person eating meals?: None Help from another person taking care of personal grooming?: A Little Help from another person toileting, which includes using toliet, bedpan, or urinal?: Total Help from another person bathing (including washing, rinsing, drying)?: A Lot Help from another person to put on and taking off regular upper body clothing?: A Lot Help from another person to put on and taking off regular lower body clothing?: Total 6 Click Score: 13   End of Session Equipment Utilized During Treatment: Oxygen Nurse Communication:  (can only assess at bed level )  Activity Tolerance: Treatment limited secondary to medical complications (Comment) (bleeding L LE per RN) Patient left: in bed;with call bell/phone within reach  OT Visit Diagnosis: Muscle weakness (generalized) (M62.81) Pain - part of body: Leg                Time: 1610-9604 OT Time Calculation (min): 26 min Charges:  OT General Charges $OT Visit: 1 Visit OT Evaluation $OT Eval Moderate Complexity: 1 Mod OT Treatments $Therapeutic Exercise: 8-22 mins G-Codes:      Malka So 05/22/2017, 11:43 AM  05/22/2017 Nestor Lewandowsky, OTR/L Pager: [phone]

## 2017-05-22 NOTE — Consult Note (Signed)
ORTHOPAEDIC CONSULTATION  REQUESTING PHYSICIAN: Allie Bossier, MD  Chief Complaint: gangrenous ulcers left thigh and calf status post fall at home.  HPI: [place] is a 82 y.o. female who presents with atrial fibrillation congestive heart failure with patient on blood thinners due to her atrial fibrillation. Patient states she has difficulty with ambulation at home she has walkers and canes around the house. Patient states that she fell at home about 2 weeks ago sustaining blunt trauma to the left calf and left thigh.  Past Medical History:  Diagnosis Date  . A-fib ([place])   . Arthritis    "knees" (11/17/2015)  . CAD (coronary artery disease)    BMS to LAD 07/2005  . Chronic atrial fibrillation ([place])    Since 2011  . Degenerative arthritis of right knee   . Essential hypertension   . GERD (gastroesophageal reflux disease)   . Hyperlipidemia   . Morbid obesity ([place])   . Myocardial infarction ([place]) 07/2005  . On home oxygen therapy    "2.5L at night" (11/17/2015)  . Psoriasis   . Pulmonary hypertension (HCC)    Mixed, PASP 67 mmHg at [facility] 2013  . Right ventricular dysfunction   . Tricuspid regurgitation    Severe   Past Surgical History:  Procedure Laterality Date  . ACHILLES TENDON SURGERY Right 1970s  . CARDIAC CATHETERIZATION N/A 12/07/2015   Procedure: Right Heart Cath;  Surgeon: Jolaine Artist, MD;  Location: [place] [facility];  Service: Cardiovascular;  Laterality: N/A;  . CARPAL TUNNEL RELEASE  11/29/2011   Procedure: CARPAL TUNNEL RELEASE;  Surgeon: Cammie Sickle., MD;  Location: [place];  Service: Orthopedics;  Laterality: Right;  . COLONOSCOPY    . CORONARY ANGIOPLASTY WITH STENT PLACEMENT  07/2005   Dr. Olevia Perches  . EYE SURGERY    . LAPAROSCOPIC CHOLECYSTECTOMY    . RETINAL DETACHMENT SURGERY Right    Social History   Social History  . Marital status: Married    Spouse name: N/A  . Number of children: 2  . Years of  education: N/A   Occupational History  . retired Retired   Social History Main Topics  . Smoking status: Former Smoker    Packs/day: 0.30    Years: 0.50    Types: Cigarettes    Quit date: 08/19/1986  . Smokeless tobacco: Never Used     Comment: pt only smoked for only a few months.  . Alcohol use No  . Drug use: No  . Sexual activity: No   Other Topics Concern  . None   Social History Narrative   Lives with husband on farm and [place]. 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 They both stay active around the farm, maintaining lawn and garden.   Family History  Problem Relation Age of Onset  . Heart attack Mother   . Coronary artery disease Father   . Stroke Father   . Heart attack Brother   . Heart failure Brother   . COPD Brother   . Emphysema Sister   . Emphysema Brother    - negative except otherwise stated in the family history section No Known Allergies Prior to Admission medications   Medication Sig Start Date End Date Taking? Authorizing Provider  ALPRAZolam Duanne Moron) 0.5 MG tablet Take 1 tablet (0.5 mg total) by mouth at bedtime. 05/16/17  Yes Arrien, Jimmy Picket, MD  atorvastatin (LIPITOR) 80 MG  tablet TAKE 1/2 TABLET BY MOUTH DAILY Patient taking differently: TAKE 1/2 TABLET (40 MG) BY MOUTH DAILY AT BEDTIME 08/07/15  Yes Satira Sark, MD  azelastine (ASTELIN) 0.1 % nasal spray Place 2 sprays into both nostrils 2 (two) times daily as needed (congestion).  02/21/17  Yes [provider]  brimonidine (ALPHAGAN) 0.2 % ophthalmic solution Place 1 drop into the right eye 2 (two) times daily.  04/21/15  Yes [provider]  brinzolamide (AZOPT) 1 % ophthalmic suspension Place 1 drop into both eyes 2 (two) times daily.   Yes [provider]  HYDROcodone-acetaminophen (NORCO/VICODIN) 5-325 MG tablet Take 1 tablet by mouth 3 (three) times daily as needed for moderate pain. 05/16/17  Yes  Arrien, Jimmy Picket, MD  latanoprost (XALATAN) 0.005 % ophthalmic solution Place 1 drop into the right eye at bedtime.  03/30/12  Yes [provider]  nitroGLYCERIN (NITROSTAT) 0.4 MG SL tablet Place 0.4 mg under the tongue every 5 (five) minutes as needed for chest pain (x 3 tablets daily). Reported on 12/28/2015   Yes [provider]  Omeprazole 20 MG TBEC Take 20 mg by mouth daily.    Yes [provider]  OXYGEN Inhale 2.5 L into the lungs at bedtime.   Yes [provider]  timolol (TIMOPTIC) 0.5 % ophthalmic solution Place 1 drop into both eyes 2 (two) times daily. 11/20/15  Yes [provider]  ambrisentan (LETAIRIS) 5 MG tablet Take 1 tablet (5 mg total) by mouth daily. 05/21/17   Collene Gobble, MD   No results found. - pertinent xrays, CT, MRI studies were reviewed and independently interpreted  Positive ROS: All other systems have been reviewed and were otherwise negative with the exception of those mentioned in the HPI and as above.  Physical Exam: General: Alert, no acute distress Psychiatric: Patient is competent for consent with normal mood and affect Lymphatic: No axillary or cervical lymphadenopathy Cardiovascular:  pedal edema bilateral lower extremities Respiratory: No cyanosis, no use of accessory musculature patient uses nasal cannula FiO2due to her chronic shortness of breath GI: No organomegaly, abdomen is soft and non-tender  Skin: examination patient has black eschar wounds on the medial thigh 2 and medial calf 1 of the left leg. There is surrounding dermatitis and drainage.   Neurologic: Patient does have protective sensation bilateral lower extremities.   MUSCULOSKELETAL:  Examination patient has a good dorsalis pedis pulse she has no pain with range of motion of the toes or ankle. She has a large black gangrenous ulcer over the medial calf and 2 ulcers over the medial thigh on the left. Her leg is extremely swollen  on the left compared the right. There is no purulent drainage.  Assessment: Assessment: Patient with multiple medical problems previously on a blood thinner sustaining a fall sustaining large hematomas and subsequently necrotic soft tissue envelope on the left lower extremity.  Plan: Plan: We'll plan on taking her to the operating room tomorrow Friday. Plan for excision of the black eschar wounds and all non-healthy nonviable soft tissue muscle and fascia. Patient may have necrotizing fasciitis however there is no crepitation with palpation around the skin. We'll plan on placement of an instillation wound VAC and then follow-up with surgery next week for repeat irrigation and debridement and will continue with installation therapy until patient has a healthy viable wound bed that we can place split thickness skin graft on. Anticipate at least one week of surgical intervention. Discussed with the patient  she is at risk of loss of limb or potential sepsis from the leg.   Thank you for the consult and the opportunity to see Ms. Unk Lightning, [place] [phone] 5:37 PM

## 2017-05-22 NOTE — Progress Notes (Signed)
HGB=8.3 at 1416.  Dr. Sherral Hammers notified.  Order received to hold unit of packed red blood cells for now.

## 2017-05-22 NOTE — Progress Notes (Signed)
PROGRESS NOTE    [name]  PXT:[id number] DOB: 06/03/1935 DOA: 05/27/2017 PCP: Redmond School, MD   Brief Narrative:  82 y.o. WF PMHx MI, Chronic Atrial Fibrillation, Chronic Diastolic CHF, Pulmonary HTN, severe Tricuspid Valve Regurgitation, chronic respiratory failure) on 2.5 L O2 at night) , CKD stage III   recent left traumatic left leg hematoma complicated by hypovolemic shock and acute blood loss anemia  Presenting with acute on chronic diastolic CHF exacerbation as well as hypertension and elevated troponin. Patient noted to have been recently admitted September 22 to September 28 for left leg traumatic hematoma with noted hypovolemic shock and acute blood loss anemia. Patient was noted to have been transferred to a rehabilitation facility after hospitalization. Per report, patient with progressive weakness since being placed within the rehabilitation facility. No reported worsening of leg hematoma though patient has had progressive edema. No reported fevers or chills. No chest pain. No reported orthopnea or PND. Has been compliant with medication regimen. No reported high salt intake.  ED Course: Presented to the ER afebrile with noted systolic blood pressures in 90s to 100s. Creatinine 1.14, potassium 5.3. Troponin of 0.06. BNP is 748. Chest x-ray with cardiomegaly and central vascular congestion mild hazy bibasilar basilar atelectasis versus infiltrates.  States she was walking with cane in the house grabbed hold of a doorknob as she was moving to another room and believes she tripped. Unsure of what she struck when she fell negative LOC. States uses a cane and a Rollator at home for ambulation.  Subjective: 10/4 A/O 4, negative CP, positive chronic SOB, negative abdominal pain, negative N/V. Positive LLE pain.      Assessment & Plan:   Active Problems:   Acute on chronic diastolic CHF (congestive heart failure) (HCC)   Hypotension   Elevated troponin  Acute on  Chronic Diastolic CHF -Intermittent episodes hypotension -Per EMR notes Cardiology/possible Pulmonary consult upon [facility]. No notes in chart from cardiology/pulmonology services. After further review of chart believe this may have been referring to patient's previous admission sits she was only discharged for 3 days -Patient remains borderline hypotensive continue to hold furosemide, Aldactone, losartan, and Letairis which were held at patient's last discharge 9/28 secondary to hypotension. -Strict in and out since admission -1.1 L -Daily weight Filed Weights   06/15/2017 1449 05/20/17 1729 05/22/17 0500  Weight: 224 lb (101.6 kg) 260 lb 12.8 oz (118.3 kg) 226 lb (102.5 kg)  -Echocardiogram: Consistent with diastolic CHF/pulmonary hypertension/cardiomyopathy cc results below -Transfuse for hemoglobin<8 -10/3 transfuse 1 unit PRBC  Pulmonary HTN -April 2017 RIGHT heart catheterization PAH; 59/17 mmHg -See CHF  Paroxysmal Atrial fibrillation,(CHADS-VASC 5)  -Rate controlled, currently in NSR   -Eliquis held secondary to large LLE hematoma   CAD, Elevated Troponin -Elevated troponin: Stable from previous admission, most likely chronically elevated. Will trend -Chest pain-free -Continue Lipitor   Hypotension -Borderline hypotension -10/4 start Midodrine 5 mg TID -MAP goal> 65       LEFT LEG Hematoma -Hold Eliquis since last admission: NOTE Eliquis reversed on 05/11/2017 -Patient with large hematoma several areas of skin breakdown with eschar formation see picture above. -Foot warm, palpable DP/PT pulse -Reconsulted Orthopedic surgery on 10/4, surgical debridement? Orthopedic surgery evaluated patient on previous admission from 9/22-9/28. -10/4 discussed with Dr. Jean Rosenthal [facility] orthopedic surgery, concerning additional breakdown patient's leg and he agreed to reevaluate patient.await further recommendations ADDENDUM: Patient to OR for surgical  debridement on 10/5  Acute on chronic respiratory failure  with hypoxia -Resolved -Currently on 2.5 L via [place] (patient's home dose) -Titrate O2 to maintain SPO2> 93%  Thrombocytopenia -intermittent and chronic: Continuing to improve  Anemia (baselineHgB~9-10)/Acute Blood loss Anemia -Anemia panel pending -patient received 1 unit PRBC on 10/3    CKD stage III(baseline Cr 0.8-1.1)    Recent Labs Lab 06/15/2017 1522 05/20/17 0502 05/22/17 0434  CREATININE 1.14* 0.97 0.92  -at baseline  Hypomagnesemia -Magnesium goal> 2 -magnesium IV 2 g  Hypokalemia -potassium goal>4       DVT prophylaxis: None secondary to large hematoma Code Status: Full Family Communication: Husband and daughter bedside for discussion of plan of care Disposition Plan: TBD   Consultants:  Dr. Jean Rosenthal Orthopedic surgery previous admission 9/22-9/28    Procedures/Significant Events:  10/3 Echocardiogram: LVEF= 60% to 65%.- Indeterminant diastolic function (atrial fibrillation).  - Ventricular septum: D-shaped interventricular septum suggesting  RV pressure/volume overload. - Aortic valve: There was no stenosis. - Mitral valve: MR was at least moderate but was not fully visualized. - Left atrium: moderately to severely dilated. - Right ventricle:  moderately dilated. - Right atrium:  severely dilated. - Tricuspid valve:  moderate regurgitation.  - Pulmonary arteries: PA peak pressure: 59 mm Hg (S). - Systemic veins: IVC measured 2.6 cm with < 50% respirophasic variation, suggesting RA pressure 15 mmHg.    I have personally reviewed and interpreted all radiology studies and my findings are as above.  VENTILATOR SETTINGS:    Cultures None  Antimicrobials: Anti-infectives    None       Devices    LINES / TUBES:      Continuous Infusions: . sodium chloride    . sodium chloride       Objective: Vitals:   05/22/17 0500 05/22/17 0550 05/22/17 0808 05/22/17 1136    BP:  101/62 (!) 89/60 (!) 98/45  Pulse:  81    Resp:  20    Temp:   99.1 F (37.3 C) 99.2 F (37.3 C)  TempSrc:   Oral Oral  SpO2:  100% 96% 99%  Weight: 226 lb (102.5 kg)     Height:        Intake/Output Summary (Last 24 hours) at 05/22/17 1255 Last data filed at 05/22/17 0300  Gross per 24 hour  Intake             1340 ml  Output             2100 ml  Net             -760 ml   Filed Weights   06/10/2017 1449 05/20/17 1729 05/22/17 0500  Weight: 224 lb (101.6 kg) 260 lb 12.8 oz (118.3 kg) 226 lb (102.5 kg)    General: A/O 4, positive chronic respiratory distress Eyes: negative scleral hemorrhage, negative anisocoria, negative icterus Lungs: Clear to auscultation bilaterally without wheezes or crackles Cardiovascular: Regular rate and rhythm without murmur gallop or rub normal S1 and S2 Abdomen: MORBIDLY OBESE, negative abdominal pain, nondistended, positive soft, bowel sounds, no rebound, no ascites, no appreciable mass Extremities: No significant cyanosis, clubbing. LLE SEVERE hematoma with multiple areas of skin breakdown and now frank bleeding with eschar see picture above, tender to palpation. DP/PT pulse palpable bilateral  Psychiatric:  Negative depression, negative anxiety, negative fatigue, negative mania  Central nervous system:  Cranial nerves II through XII intact, tongue/uvula midline, all extremities muscle strength 5/5, sensation intact throughout, negative dysarthria, negative expressive aphasia, negative receptive aphasia.  [name]  Data Reviewed: Care during the described time interval was provided by me .  I have reviewed this patient's available data, including medical history, events of note, physical examination, and all test results as part of my evaluation.   CBC:  Recent Labs Lab 05/16/17 0505 06/18/2017 1522 05/21/17 0902 05/22/17 0812  WBC  --  7.1 11.8*  --   NEUTROABS  --  4.5  --   --   HGB 8.0* 7.8* 7.3* 7.8*  HCT 25.9* 25.5* 23.2* 26.0*   MCV  --  97.7 94.3  --   PLT  --  184 183  --    Basic Metabolic Panel:  Recent Labs Lab 05/24/2017 1522 05/20/17 0502 05/22/17 0434  NA 139 136 138  K 5.3* 5.1 3.7  CL 104 103 98*  CO2 28 27 33*  GLUCOSE 120* 90 91  BUN 41* 39* 26*  CREATININE 1.14* 0.97 0.92  CALCIUM 9.4 8.9 8.7*  MG  --   --  1.5*   GFR: Estimated Creatinine Clearance: 55.7 mL/min (by C-G formula based on SCr of 0.92 mg/dL). Liver Function Tests: No results for input(s): AST, ALT, ALKPHOS, BILITOT, PROT, ALBUMIN in the last 168 hours. No results for input(s): LIPASE, AMYLASE in the last 168 hours. No results for input(s): AMMONIA in the last 168 hours. Coagulation Profile: No results for input(s): INR, PROTIME in the last 168 hours. Cardiac Enzymes:  Recent Labs Lab 06/11/2017 1648  TROPONINI 0.06*   BNP (last 3 results) No results for input(s): PROBNP in the last 8760 hours. HbA1C: No results for input(s): HGBA1C in the last 72 hours. CBG: No results for input(s): GLUCAP in the last 168 hours. Lipid Profile: No results for input(s): CHOL, HDL, LDLCALC, TRIG, CHOLHDL, LDLDIRECT in the last 72 hours. Thyroid Function Tests: No results for input(s): TSH, T4TOTAL, FREET4, T3FREE, THYROIDAB in the last 72 hours. Anemia Panel:  Recent Labs  05/21/17 0902  VITAMINB12 517  FOLATE 10.8  FERRITIN 65  TIBC 265  IRON 15*  RETICCTPCT 4.4*   Urine analysis:    Component Value Date/Time   COLORURINE YELLOW 02/10/2016 1503   APPEARANCEUR CLOUDY (A) 02/10/2016 1503   LABSPEC 1.014 02/10/2016 1503   PHURINE 5.5 02/10/2016 1503   GLUCOSEU NEGATIVE 02/10/2016 1503   HGBUR NEGATIVE 02/10/2016 1503   BILIRUBINUR NEGATIVE 02/10/2016 1503   KETONESUR NEGATIVE 02/10/2016 1503   PROTEINUR NEGATIVE 02/10/2016 1503   NITRITE NEGATIVE 02/10/2016 1503   LEUKOCYTESUR NEGATIVE 02/10/2016 1503   Sepsis Labs: @LABRCNTIP (procalcitonin:4,lacticidven:4)  ) Recent Results (from the past 240 hour(s))  MRSA PCR  Screening     Status: None   Collection Time: 05/13/17  6:57 AM  Result Value Ref Range Status   MRSA by PCR NEGATIVE NEGATIVE Final    Comment:        The GeneXpert MRSA Assay (FDA approved for NASAL specimens only), is one component of a comprehensive MRSA colonization surveillance program. It is not intended to diagnose MRSA infection nor to guide or monitor treatment for MRSA infections.   MRSA PCR Screening     Status: None   Collection Time: 05/20/17  6:37 PM  Result Value Ref Range Status   MRSA by PCR NEGATIVE NEGATIVE Final    Comment:        The GeneXpert MRSA Assay (FDA approved for NASAL specimens only), is one component of a comprehensive MRSA colonization surveillance program. It is not intended to diagnose MRSA infection nor to guide or monitor treatment  for MRSA infections.          Radiology Studies: No results found.      Scheduled Meds: . brimonidine  1 drop Right Eye BID  . brinzolamide  1 drop Both Eyes BID  . furosemide  40 mg Intravenous Daily  . Gerhardt's butt cream   Topical BID  . latanoprost  1 drop Right Eye QHS  . sodium chloride flush  3 mL Intravenous Q12H  . timolol  1 drop Both Eyes BID   Continuous Infusions: . sodium chloride    . sodium chloride       LOS: 3 days    Time spent: 40 minutes    WOODS, Geraldo Docker, MD [facility] Pager [phone]   If 7PM-7AM, please contact night-coverage [email or web address] Password [email or web address] 05/22/2017, 12:55 PM

## 2017-05-23 ENCOUNTER — Encounter (HOSPITAL_COMMUNITY): Admission: EM | Disposition: E | Payer: Self-pay | Source: Home / Self Care | Attending: Internal Medicine

## 2017-05-23 ENCOUNTER — Inpatient Hospital Stay (HOSPITAL_COMMUNITY): Payer: Medicare Other

## 2017-05-23 ENCOUNTER — Encounter (HOSPITAL_COMMUNITY): Payer: Self-pay | Admitting: Certified Registered Nurse Anesthetist

## 2017-05-23 ENCOUNTER — Inpatient Hospital Stay (HOSPITAL_COMMUNITY): Payer: Medicare Other | Admitting: Anesthesiology

## 2017-05-23 DIAGNOSIS — S8012XD Contusion of left lower leg, subsequent encounter: Secondary | ICD-10-CM

## 2017-05-23 DIAGNOSIS — S79822S Other specified injuries of left thigh, sequela: Secondary | ICD-10-CM

## 2017-05-23 DIAGNOSIS — S8012XS Contusion of left lower leg, sequela: Secondary | ICD-10-CM

## 2017-05-23 DIAGNOSIS — S8012XA Contusion of left lower leg, initial encounter: Secondary | ICD-10-CM

## 2017-05-23 HISTORY — PX: I&D EXTREMITY: SHX5045

## 2017-05-23 LAB — BASIC METABOLIC PANEL
ANION GAP: 9 (ref 5–15)
BUN: 29 mg/dL — ABNORMAL HIGH (ref 6–20)
CHLORIDE: 98 mmol/L — AB (ref 101–111)
CO2: 31 mmol/L (ref 22–32)
Calcium: 8.9 mg/dL (ref 8.9–10.3)
Creatinine, Ser: 0.91 mg/dL (ref 0.44–1.00)
GFR calc non Af Amer: 58 mL/min — ABNORMAL LOW (ref >60)
Glucose, Bld: 90 mg/dL (ref 65–99)
POTASSIUM: 3.8 mmol/L (ref 3.5–5.1)
SODIUM: 138 mmol/L (ref 135–145)

## 2017-05-23 LAB — TROPONIN I
TROPONIN I: 0.06 ng/mL — AB (ref <0.03)
TROPONIN I: 0.05 ng/mL — AB (ref <0.03)

## 2017-05-23 LAB — MAGNESIUM: MAGNESIUM: 1.8 mg/dL (ref 1.7–2.4)

## 2017-05-23 LAB — SURGICAL PCR SCREEN
MRSA, PCR: NEGATIVE
STAPHYLOCOCCUS AUREUS: NEGATIVE

## 2017-05-23 LAB — PROTIME-INR
INR: 1.23
PROTHROMBIN TIME: 15.4 s — AB (ref 11.4–15.2)

## 2017-05-23 SURGERY — IRRIGATION AND DEBRIDEMENT EXTREMITY
Anesthesia: General | Laterality: Left

## 2017-05-23 MED ORDER — METOCLOPRAMIDE HCL 5 MG PO TABS
5.0000 mg | ORAL_TABLET | Freq: Three times a day (TID) | ORAL | Status: DC | PRN
  Filled 2017-05-23: qty 1

## 2017-05-23 MED ORDER — DOCUSATE SODIUM 100 MG PO CAPS
100.0000 mg | ORAL_CAPSULE | Freq: Two times a day (BID) | ORAL | Status: DC
  Administered 2017-05-23 – 2017-05-29 (×10): 100 mg via ORAL
  Filled 2017-05-23 (×12): qty 1

## 2017-05-23 MED ORDER — DEXAMETHASONE SODIUM PHOSPHATE 10 MG/ML IJ SOLN
INTRAMUSCULAR | Status: AC
  Filled 2017-05-23: qty 1

## 2017-05-23 MED ORDER — OXYCODONE HCL 5 MG PO TABS
ORAL_TABLET | ORAL | Status: AC
  Administered 2017-05-23: 16:00:00
  Filled 2017-05-23: qty 2

## 2017-05-23 MED ORDER — ONDANSETRON HCL 4 MG/2ML IJ SOLN: 4.0000 mg | Freq: Four times a day (QID) | INTRAMUSCULAR | Status: DC | PRN

## 2017-05-23 MED ORDER — CEFAZOLIN SODIUM-DEXTROSE 2-4 GM/100ML-% IV SOLN
2.0000 g | INTRAVENOUS | Status: AC
  Administered 2017-05-23: 2 g via INTRAVENOUS
  Filled 2017-05-23 (×2): qty 100

## 2017-05-23 MED ORDER — ACETAMINOPHEN 325 MG PO TABS
650.0000 mg | ORAL_TABLET | Freq: Four times a day (QID) | ORAL | Status: DC | PRN
  Administered 2017-05-29: 650 mg via ORAL
  Filled 2017-05-23: qty 2

## 2017-05-23 MED ORDER — FENTANYL CITRATE (PF) 100 MCG/2ML IJ SOLN
25.0000 ug | INTRAMUSCULAR | Status: DC | PRN
  Administered 2017-05-23: 50 ug via INTRAVENOUS
  Administered 2017-05-23: 25 ug via INTRAVENOUS

## 2017-05-23 MED ORDER — MAGNESIUM CITRATE PO SOLN: 1.0000 | Freq: Once | ORAL | Status: DC | PRN

## 2017-05-23 MED ORDER — ONDANSETRON HCL 4 MG PO TABS: 4.0000 mg | ORAL_TABLET | Freq: Four times a day (QID) | ORAL | Status: DC | PRN

## 2017-05-23 MED ORDER — METHOCARBAMOL 1000 MG/10ML IJ SOLN
500.0000 mg | Freq: Four times a day (QID) | INTRAVENOUS | Status: DC | PRN
  Filled 2017-05-23: qty 5

## 2017-05-23 MED ORDER — ACETAMINOPHEN 650 MG RE SUPP: 650.0000 mg | Freq: Four times a day (QID) | RECTAL | Status: DC | PRN

## 2017-05-23 MED ORDER — LACTATED RINGERS IV SOLN
INTRAVENOUS | Status: DC
  Administered 2017-05-23: 13:00:00 via INTRAVENOUS

## 2017-05-23 MED ORDER — EPHEDRINE 5 MG/ML INJ
INTRAVENOUS | Status: AC
  Filled 2017-05-23: qty 10

## 2017-05-23 MED ORDER — PHENYLEPHRINE 40 MCG/ML (10ML) SYRINGE FOR IV PUSH (FOR BLOOD PRESSURE SUPPORT)
PREFILLED_SYRINGE | INTRAVENOUS | Status: AC
  Filled 2017-05-23: qty 10

## 2017-05-23 MED ORDER — METHOCARBAMOL 500 MG PO TABS: 500.0000 mg | ORAL_TABLET | Freq: Four times a day (QID) | ORAL | Status: DC | PRN

## 2017-05-23 MED ORDER — PHENYLEPHRINE HCL 10 MG/ML IJ SOLN: INTRAVENOUS | Status: DC | PRN

## 2017-05-23 MED ORDER — ONDANSETRON HCL 4 MG/2ML IJ SOLN: 4.0000 mg | Freq: Once | INTRAMUSCULAR | Status: DC | PRN

## 2017-05-23 MED ORDER — PROPOFOL 10 MG/ML IV BOLUS
INTRAVENOUS | Status: AC
  Filled 2017-05-23: qty 20

## 2017-05-23 MED ORDER — METOCLOPRAMIDE HCL 5 MG/ML IJ SOLN: 5.0000 mg | Freq: Three times a day (TID) | INTRAMUSCULAR | Status: DC | PRN

## 2017-05-23 MED ORDER — LIDOCAINE 2% (20 MG/ML) 5 ML SYRINGE
INTRAMUSCULAR | Status: DC | PRN
  Administered 2017-05-23: 100 mg via INTRAVENOUS

## 2017-05-23 MED ORDER — 0.9 % SODIUM CHLORIDE (POUR BTL) OPTIME
TOPICAL | Status: DC | PRN
  Administered 2017-05-23: 3000 mL

## 2017-05-23 MED ORDER — FENTANYL CITRATE (PF) 100 MCG/2ML IJ SOLN
INTRAMUSCULAR | Status: AC
  Administered 2017-05-23: 50 ug via INTRAVENOUS
  Filled 2017-05-23: qty 2

## 2017-05-23 MED ORDER — SODIUM CHLORIDE 0.9 % IV SOLN: INTRAVENOUS | Status: DC

## 2017-05-23 MED ORDER — DEXAMETHASONE SODIUM PHOSPHATE 10 MG/ML IJ SOLN
INTRAMUSCULAR | Status: DC | PRN
  Administered 2017-05-23: 10 mg via INTRAVENOUS

## 2017-05-23 MED ORDER — OXYCODONE HCL 5 MG PO TABS
5.0000 mg | ORAL_TABLET | ORAL | Status: DC | PRN
  Administered 2017-05-23: 10 mg via ORAL
  Administered 2017-05-23 – 2017-05-28 (×10): 5 mg via ORAL
  Administered 2017-05-28 – 2017-05-29 (×5): 10 mg via ORAL
  Filled 2017-05-23: qty 2
  Filled 2017-05-23: qty 1
  Filled 2017-05-23: qty 2
  Filled 2017-05-23 (×4): qty 1
  Filled 2017-05-23: qty 2
  Filled 2017-05-23 (×5): qty 1
  Filled 2017-05-23 (×2): qty 2
  Filled 2017-05-23: qty 1
  Filled 2017-05-23: qty 2

## 2017-05-23 MED ORDER — ONDANSETRON HCL 4 MG/2ML IJ SOLN
INTRAMUSCULAR | Status: AC
  Filled 2017-05-23: qty 2

## 2017-05-23 MED ORDER — POLYETHYLENE GLYCOL 3350 17 G PO PACK: 17.0000 g | PACK | Freq: Every day | ORAL | Status: DC | PRN

## 2017-05-23 MED ORDER — CHLORHEXIDINE GLUCONATE 4 % EX LIQD
60.0000 mL | Freq: Once | CUTANEOUS | Status: AC
  Administered 2017-05-23: 4 via TOPICAL
  Filled 2017-05-23: qty 60

## 2017-05-23 MED ORDER — LIDOCAINE 2% (20 MG/ML) 5 ML SYRINGE
INTRAMUSCULAR | Status: AC
  Filled 2017-05-23: qty 5

## 2017-05-23 MED ORDER — FENTANYL CITRATE (PF) 250 MCG/5ML IJ SOLN
INTRAMUSCULAR | Status: AC
  Filled 2017-05-23: qty 5

## 2017-05-23 MED ORDER — ONDANSETRON HCL 4 MG/2ML IJ SOLN
INTRAMUSCULAR | Status: DC | PRN
  Administered 2017-05-23: 4 mg via INTRAVENOUS

## 2017-05-23 MED ORDER — FENTANYL CITRATE (PF) 100 MCG/2ML IJ SOLN
INTRAMUSCULAR | Status: DC | PRN
  Administered 2017-05-23 (×5): 25 ug via INTRAVENOUS

## 2017-05-23 MED ORDER — POVIDONE-IODINE 10 % EX SWAB
2.0000 "application " | Freq: Once | CUTANEOUS | Status: DC
  Administered 2017-05-23: 2 via TOPICAL

## 2017-05-23 MED ORDER — PHENYLEPHRINE HCL 10 MG/ML IJ SOLN
INTRAMUSCULAR | Status: DC | PRN
  Administered 2017-05-23: 50 ug/min via INTRAVENOUS

## 2017-05-23 MED ORDER — BISACODYL 10 MG RE SUPP: 10.0000 mg | Freq: Every day | RECTAL | Status: DC | PRN

## 2017-05-23 MED ORDER — CEFAZOLIN SODIUM-DEXTROSE 2-4 GM/100ML-% IV SOLN
2.0000 g | Freq: Four times a day (QID) | INTRAVENOUS | Status: AC
  Administered 2017-05-23 – 2017-05-24 (×3): 2 g via INTRAVENOUS
  Filled 2017-05-23 (×3): qty 100

## 2017-05-23 MED ORDER — HYDROMORPHONE HCL 1 MG/ML IJ SOLN
1.0000 mg | INTRAMUSCULAR | Status: DC | PRN
  Filled 2017-05-23: qty 1

## 2017-05-23 MED ORDER — PROPOFOL 10 MG/ML IV BOLUS
INTRAVENOUS | Status: DC | PRN
  Administered 2017-05-23: 100 mg via INTRAVENOUS

## 2017-05-23 MED ORDER — PHENYLEPHRINE 40 MCG/ML (10ML) SYRINGE FOR IV PUSH (FOR BLOOD PRESSURE SUPPORT)
PREFILLED_SYRINGE | INTRAVENOUS | Status: DC | PRN
Start: 2017-05-23 — End: 2017-05-23
  Administered 2017-05-23: 80 ug via INTRAVENOUS
  Administered 2017-05-23: 120 ug via INTRAVENOUS
  Administered 2017-05-23 (×2): 80 ug via INTRAVENOUS

## 2017-05-23 SURGICAL SUPPLY — 35 items
BLADE SURG 21 STRL SS (BLADE) ×3 IMPLANT
BNDG COHESIVE 6X5 TAN STRL LF (GAUZE/BANDAGES/DRESSINGS) IMPLANT
BNDG GAUZE ELAST 4 BULKY (GAUZE/BANDAGES/DRESSINGS) IMPLANT
CANISTER WOUND CARE 500ML ATS (WOUND CARE) ×3 IMPLANT
CASSETTE VERAFLO VERALINK (MISCELLANEOUS) IMPLANT
COVER SURGICAL LIGHT HANDLE (MISCELLANEOUS) ×6 IMPLANT
DRAPE U-SHAPE 47X51 STRL (DRAPES) ×3 IMPLANT
DRESSING VERAFLO CLEANSE CC (GAUZE/BANDAGES/DRESSINGS) ×3 IMPLANT
DRSG ADAPTIC 3X8 NADH LF (GAUZE/BANDAGES/DRESSINGS) IMPLANT
DRSG VERAFLO CLEANSE CC (GAUZE/BANDAGES/DRESSINGS) ×9
DURAPREP 26ML APPLICATOR (WOUND CARE) ×3 IMPLANT
ELECT REM PT RETURN 9FT ADLT (ELECTROSURGICAL)
ELECTRODE REM PT RTRN 9FT ADLT (ELECTROSURGICAL) IMPLANT
GAUZE SPONGE 4X4 12PLY STRL (GAUZE/BANDAGES/DRESSINGS) ×3 IMPLANT
GLOVE BIOGEL PI IND STRL 9 (GLOVE) ×1 IMPLANT
GLOVE BIOGEL PI INDICATOR 9 (GLOVE) ×2
GLOVE SURG ORTHO 9.0 STRL STRW (GLOVE) ×3 IMPLANT
GOWN STRL REUS W/ TWL XL LVL3 (GOWN DISPOSABLE) ×2 IMPLANT
GOWN STRL REUS W/TWL XL LVL3 (GOWN DISPOSABLE) ×6
HANDPIECE INTERPULSE COAX TIP (DISPOSABLE)
KIT BASIN OR (CUSTOM PROCEDURE TRAY) ×3 IMPLANT
KIT ROOM TURNOVER OR (KITS) ×3 IMPLANT
MANIFOLD NEPTUNE II (INSTRUMENTS) ×3 IMPLANT
NS IRRIG 1000ML POUR BTL (IV SOLUTION) ×3 IMPLANT
PACK ORTHO EXTREMITY (CUSTOM PROCEDURE TRAY) ×3 IMPLANT
PAD ARMBOARD 7.5X6 YLW CONV (MISCELLANEOUS) ×6 IMPLANT
SET HNDPC FAN SPRY TIP SCT (DISPOSABLE) IMPLANT
STOCKINETTE IMPERVIOUS 9X36 MD (GAUZE/BANDAGES/DRESSINGS) IMPLANT
SUT ETHILON 2 0 PSLX (SUTURE) ×6 IMPLANT
SWAB COLLECTION DEVICE MRSA (MISCELLANEOUS) ×3 IMPLANT
SWAB CULTURE ESWAB REG 1ML (MISCELLANEOUS) IMPLANT
TOWEL OR 17X26 10 PK STRL BLUE (TOWEL DISPOSABLE) ×3 IMPLANT
TUBE CONNECTING 12'X1/4 (SUCTIONS) ×1
TUBE CONNECTING 12X1/4 (SUCTIONS) ×2 IMPLANT
YANKAUER SUCT BULB TIP NO VENT (SUCTIONS) ×3 IMPLANT

## 2017-05-23 NOTE — Progress Notes (Signed)
PT Cancellation Note  Patient Details Name: [name] MRN: [id number] DOB: 07-24-1935   Cancelled Treatment:    Reason Eval/Treat Not Completed: Patient declined, no reason specified;Pain limiting ability to participate Pt declined mobility at this time, planning to go to OR for I&D of LLE. Will follow up post op.  Marguarite Arbour A Skyy Nilan 06/16/2017, 10:21 AM Wray Kearns, PT, DPT [phone]

## 2017-05-23 NOTE — Care Management Important Message (Signed)
Important Message  Patient Details  Name: [name] MRN: [id number] Date of Birth: 10-23-1934   Medicare Important Message Given:  Yes    Abbygael Curtiss Abena 05/22/2017, 10:04 AM

## 2017-05-23 NOTE — Progress Notes (Signed)
Patient has been having issues with maintaining oxygen sats as they have been going as low as 82% on 3L [place]. I have tried IS and although it came back up into the 90s, patient having a hard time staying awake. I notified Dr. Rockne Menghini and orders received for CXR and ABG which were obtained. It was reported by the charge nurse that the patient did this yesterday and it was felt it may have been the norco. Dr. Rockne Menghini gave me orders to D/c Norco. OR came to get patient and Dr. Rockne Menghini at bedside and patient more awake. Stated it was ok to take patient to OR

## 2017-05-23 NOTE — Anesthesia Procedure Notes (Signed)
Procedure Name: LMA Insertion Date/Time: 06/09/2017 1:50 PM Performed by: Candis Shine Pre-anesthesia Checklist: Patient identified, Emergency Drugs available, Suction available and Patient being monitored Patient Re-evaluated:Patient Re-evaluated prior to induction Oxygen Delivery Method: Circle System Utilized Preoxygenation: Pre-oxygenation with 100% oxygen Induction Type: IV induction Ventilation: Mask ventilation without difficulty LMA: LMA inserted LMA Size: 4.0 Number of attempts: 1 Placement Confirmation: positive ETCO2 Tube secured with: Tape Dental Injury: Teeth and Oropharynx as per pre-operative assessment

## 2017-05-23 NOTE — Transfer of Care (Signed)
Immediate Anesthesia Transfer of Care Note  Patient: [place]aut  Procedure(s) Performed: IRRIGATION AND DEBRIDEMENT LEFT THIGH AND LEFT LEG (Left )  Patient Location: PACU  Anesthesia Type:General  Level of Consciousness: awake, alert  and oriented  Airway & Oxygen Therapy: Patient Spontanous Breathing and Patient connected to nasal cannula oxygen  Post-op Assessment: Report given to RN and Post -op Vital signs reviewed and stable  Post vital signs: Reviewed and stable  Last Vitals:  Vitals:   06/08/2017 1106 05/29/2017 1146  BP: (!) 94/45 (!) 100/56  Pulse: 61   Resp: 19   Temp:    SpO2: 95% 99%    Last Pain:  Vitals:   05/25/2017 0900  TempSrc: Oral  PainSc:       Patients Stated Pain Goal: 0 (76/19/50 9326)  Complications: No apparent anesthesia complications

## 2017-05-23 NOTE — Anesthesia Preprocedure Evaluation (Addendum)
Anesthesia Evaluation  Patient identified by MRN, date of birth, ID band Patient awake    Reviewed: Allergy & Precautions, H&P , NPO status , Patient's Chart, lab work & pertinent test results  History of Anesthesia Complications Negative for: history of anesthetic complications  Airway Mallampati: IV   Neck ROM: Full    Dental  (+) Teeth Intact, Dental Advisory Given   Pulmonary shortness of breath and Long-Term Oxygen Therapy, former smoker,     + decreased breath sounds      Cardiovascular hypertension, + CAD, + Past MI, + Cardiac Stents, + Peripheral Vascular Disease, +CHF and + DOE  + dysrhythmias Atrial Fibrillation + Valvular Problems/Murmurs MR  Rhythm:Irregular Rate:Normal + Systolic murmurs TTE 70/9628 - Ejection fraction was in the range of 60% to 65%. Indeterminant diastolic function (atrial fibrillation). - Ventricular septum: D-shaped interventricular septum suggesting  RV pressure/volume overload. - Mitral valve: There was mild mitral valve prolapse with an eccentric, posteriorly-directed mitral regurgitation jet. The MR was at least moderate but was not fully  visualized. - Left atrium: The atrium was moderately to severely dilated. - Right ventricle: The cavity size was moderately dilated. Systolic  function was mildly reduced. - Right atrium: The atrium was severely dilated. - Tricuspid valve: There was moderate regurgitation. Peak RV-RA  gradient (S): 44 mm Hg. - Pulmonary arteries: PA peak pressure: 59 mm Hg (S). - Systemic veins: IVC measured 2.6 cm with < 50% respirophasic  variation, suggesting RA pressure 15 mmHg.   Neuro/Psych  Neuromuscular disease    GI/Hepatic GERD  Controlled,  Endo/Other  Morbid obesity  Renal/GU CRFRenal disease     Musculoskeletal  (+) Arthritis ,   Abdominal (+) + obese,   Peds  Hematology  (+) anemia ,   Anesthesia Other Findings   Reproductive/Obstetrics                             Anesthesia Physical  Anesthesia Plan  ASA: IV  Anesthesia Plan: General   Post-op Pain Management:    Induction: Intravenous  PONV Risk Score and Plan: 3 and Ondansetron and Treatment may vary due to age or medical condition  Airway Management Planned: LMA  Additional Equipment: None  Intra-op Plan:   Post-operative Plan: Extubation in OR  Informed Consent:   Dental advisory given  Plan Discussed with: CRNA  Anesthesia Plan Comments:         Anesthesia Quick Evaluation

## 2017-05-23 NOTE — Progress Notes (Signed)
Progress Note    [name]  YSA:[id number] DOB: [birth year]  DOA: 06/01/2017 PCP: Redmond School, MD    Brief Narrative:   Chief complaint: Follow-up heart failure and hematoma of the leg  Medical records reviewed and are as summarized below:  [name] is an 82 y.o. female with a PMH of MI, Chronic Atrial Fibrillation, Chronic Diastolic CHF, Pulmonary HTN, severe Tricuspid Valve Regurgitation, chronic respiratory failure) on 2.5 L O2 at night) , CKD stage III   recent left traumatic left leg hematoma complicated by hypovolemic shock and acute blood loss anemia who was admitted 06/15/2017 with an acute on chronic diastolic CHF exacerbation associated with hypertension and elevated troponin. Of note, she was recently hospitalized 05/10/17-05/16/17 after falling and suffering from a left leg traumatic hematoma complicated by hypovolemic shock and acute blood loss anemia.  Assessment/Plan:   Principal problem:  Acute on chronic respiratory failure/Acute on Chronic Diastolic CHF in the setting of atrial fibrillation/demand ischemia Furosemide, Aldactone, losartan, and Letairis have been on hold secondary to hypotension. 2-D echo done 05/21/17 and showed EF 60-65 percent, atrial fibrillation.  Acute blood loss anemia in the setting of traumatic hematoma Given 1 unit of PRBCs on 05/21/17.  Pulmonary HTN April 2017 RIGHT heart catheterization PAH; 59/17 mmHg  Paroxysmal Atrial fibrillation,(CHADS-VASC 5)  Rate controlled, currently in NSR. Eliquis held secondary to large LLE hematoma.  CAD, Elevated Troponin No complaints of chest pains. Continue Lipitor.  Hypotension Continue midodrine.  LEFT LEG Hematoma Eliquis reversed on 05/11/2017. Surgically debrided 06/15/2017. Now with VAC. Plan to return to call our later in the week for split thickness skin graft.  Thrombocytopenia Monitoring. Appears to be intermittently chronic.  CKD stage III(baseline Cr 0.8-1.1)     Creatinine consistent with baseline values.  Hypomagnesemia Supplemented.  Hypokalemia Monitor and replace as needed.  Obesity Body mass index is 40.03 kg/m.   Family Communication/Anticipated D/C date and plan/Code Status   DVT prophylaxis: SCDs ordered. Code Status: Full Code.  Family Communication: No family at bedside. Disposition Plan: Likely will need SNF.   Medical Consultants:    Orthopedic Surgery   Anti-Infectives:    None  Subjective:   Patient continues to have significant leg pain. She is thirsty, but is currently nothing by mouth for her surgery. No complaints of dyspnea present.  Objective:    Vitals:   06/12/2017 0352 06/06/2017 0511 06/12/2017 0838 06/15/2017 0900  BP: (!) 97/52 (!) 113/51 (!) 94/40   Pulse: (!) 55 82    Resp: 17 (!) 28    Temp:    98.1 F (36.7 C)  TempSrc:    Oral  SpO2: 96% 100% 100%   Weight:      Height:        Intake/Output Summary (Last 24 hours) at 05/22/2017 0910 Last data filed at 06/09/2017 0500  Gross per 24 hour  Intake              120 ml  Output              450 ml  Net             -330 ml   Filed Weights   06/09/2017 1449 05/20/17 1729 05/22/17 0500  Weight: 101.6 kg (224 lb) 118.3 kg (260 lb 12.8 oz) 102.5 kg (226 lb)    Exam: General: No acute distress. Cardiovascular: Heart sounds show a regular rate, and rhythm. No gallops or rubs. No murmurs. No JVD. Lungs:  Clear to auscultation bilaterally with good air movement. No rales, rhonchi or wheezes. Abdomen: Soft, nontender, nondistended with normal active bowel sounds. No masses. No hepatosplenomegaly. Neurological: Alert and oriented 2. Moves all extremities 4 with equal strength. Cranial nerves II through XII grossly intact. Skin: Extensive hematoma as pictured in progress note from 05/22/17. Extremities: No clubbing or cyanosis. No edema. Pedal pulses 2+. Psychiatric: Mood and affect are normal. Insight and judgment are fair.   Data Reviewed:    I have personally reviewed following labs and imaging studies:  Labs: Labs show the following:   Basic Metabolic Panel:  Recent Labs Lab 06/04/2017 1522 05/20/17 0502 05/22/17 0434 05/27/2017 0158  NA 139 136 138 138  K 5.3* 5.1 3.7 3.8  CL 104 103 98* 98*  CO2 28 27 33* 31  GLUCOSE 120* 90 91 90  BUN 41* 39* 26* 29*  CREATININE 1.14* 0.97 0.92 0.91  CALCIUM 9.4 8.9 8.7* 8.9  MG  --   --  1.5* 1.8   GFR Estimated Creatinine Clearance: 56.4 mL/min (by C-G formula based on SCr of 0.91 mg/dL). Coagulation profile  Recent Labs Lab 05/22/17 1416 05/26/2017 0638  INR 1.25 1.23    CBC:  Recent Labs Lab 06/14/2017 1522 05/21/17 0902 05/22/17 0812 05/22/17 1416  WBC 7.1 11.8*  --  10.2  NEUTROABS 4.5  --   --  8.1*  HGB 7.8* 7.3* 7.8* 8.3*  HCT 25.5* 23.2* 26.0* 26.3*  MCV 97.7 94.3  --  93.6  PLT 184 183  --  204   Cardiac Enzymes:  Recent Labs Lab 05/24/2017 1648 05/22/17 1912 06/14/2017 0158 06/12/2017 0638  TROPONINI 0.06* 0.06* 0.06* 0.05*   BAnemia work up:  Recent Labs  05/21/17 0902  VITAMINB12 517  FOLATE 10.8  FERRITIN 65  TIBC 265  IRON 15*  RETICCTPCT 4.4*   Sepsis Labs:  Recent Labs Lab 06/15/2017 1522 06/11/2017 1528 05/21/17 0902 05/22/17 1416  WBC 7.1  --  11.8* 10.2  LATICACIDVEN  --  0.7  --   --     Microbiology Recent Results (from the past 240 hour(s))  MRSA PCR Screening     Status: None   Collection Time: 05/20/17  6:37 PM  Result Value Ref Range Status   MRSA by PCR NEGATIVE NEGATIVE Final    Comment:        The GeneXpert MRSA Assay (FDA approved for NASAL specimens only), is one component of a comprehensive MRSA colonization surveillance program. It is not intended to diagnose MRSA infection nor to guide or monitor treatment for MRSA infections.   Surgical pcr screen     Status: None   Collection Time: 06/16/2017  6:38 AM  Result Value Ref Range Status   MRSA, PCR NEGATIVE NEGATIVE Final   Staphylococcus aureus  NEGATIVE NEGATIVE Final    Comment: (NOTE) The Xpert SA Assay (FDA approved for NASAL specimens in patients 55 years of age and older), is one component of a comprehensive surveillance program. It is not intended to diagnose infection nor to guide or monitor treatment.     Procedures and diagnostic studies:  No results found.  Medications:   . brimonidine  1 drop Right Eye BID  . brinzolamide  1 drop Both Eyes BID  . furosemide  40 mg Intravenous Daily  . Gerhardt's butt cream   Topical BID  . latanoprost  1 drop Right Eye QHS  . midodrine  5 mg Oral TID WC  . sodium chloride flush  3 mL Intravenous Q12H  . timolol  1 drop Both Eyes BID   Continuous Infusions: . sodium chloride    . sodium chloride       LOS: 4 days   [name]  [facility] Pager [phone]. If unable to reach me by pager, please call my cell phone at [phone].  *Please refer to [email or web address], password [email or web address] to get updated schedule on who will round on this patient, as hospitalists switch teams weekly. If 7PM-7AM, please contact night-coverage at [email or web address], password [email or web address] for any overnight needs.  05/30/2017, 9:10 AM

## 2017-05-23 NOTE — H&P (View-Only) (Signed)
ORTHOPAEDIC CONSULTATION  REQUESTING PHYSICIAN: Allie Bossier, MD  Chief Complaint: gangrenous ulcers left thigh and calf status post fall at home.  HPI: [name] is a 82 y.o. female who presents with atrial fibrillation congestive heart failure with patient on blood thinners due to her atrial fibrillation. Patient states she has difficulty with ambulation at home she has walkers and canes around the house. Patient states that she fell at home about 2 weeks ago sustaining blunt trauma to the left calf and left thigh.  Past Medical History:  Diagnosis Date  . A-fib ([place])   . Arthritis    "knees" (11/17/2015)  . CAD (coronary artery disease)    BMS to LAD 07/2005  . Chronic atrial fibrillation ([place])    Since 2011  . Degenerative arthritis of right knee   . Essential hypertension   . GERD (gastroesophageal reflux disease)   . Hyperlipidemia   . Morbid obesity ([place])   . Myocardial infarction ([place]) 07/2005  . On home oxygen therapy    "2.5L at night" (11/17/2015)  . Psoriasis   . Pulmonary hypertension ([facility])    Mixed, PASP 67 mmHg at [facility] 2013  . Right ventricular dysfunction   . Tricuspid regurgitation    Severe   Past Surgical History:  Procedure Laterality Date  . ACHILLES TENDON SURGERY Right 1970s  . CARDIAC CATHETERIZATION N/A 12/07/2015   Procedure: Right Heart Cath;  Surgeon: Jolaine Artist, MD;  Location: [place] [facility];  Service: Cardiovascular;  Laterality: N/A;  . CARPAL TUNNEL RELEASE  11/29/2011   Procedure: CARPAL TUNNEL RELEASE;  Surgeon: Cammie Sickle., MD;  Location: [facility];  Service: Orthopedics;  Laterality: Right;  . COLONOSCOPY    . CORONARY ANGIOPLASTY WITH STENT PLACEMENT  07/2005   Dr. Olevia Perches  . EYE SURGERY    . LAPAROSCOPIC CHOLECYSTECTOMY    . RETINAL DETACHMENT SURGERY Right    Social History   Social History  . Marital status: Married    Spouse name: N/A  . Number of children: 2  . Years of  education: N/A   Occupational History  . retired Retired   Social History Main Topics  . Smoking status: Former Smoker    Packs/day: 0.30    Years: 0.50    Types: Cigarettes    Quit date: 08/19/1986  . Smokeless tobacco: Never Used     Comment: pt only smoked for only a few months.  . Alcohol use No  . Drug use: No  . Sexual activity: No   Other Topics Concern  . None   Social History Narrative   Lives with husband on farm and [place]. 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 They both stay active around the farm, maintaining lawn and garden.   Family History  Problem Relation Age of Onset  . Heart attack Mother   . Coronary artery disease Father   . Stroke Father   . Heart attack Brother   . Heart failure Brother   . COPD Brother   . Emphysema Sister   . Emphysema Brother    - negative except otherwise stated in the family history section No Known Allergies Prior to Admission medications   Medication Sig Start Date End Date Taking? Authorizing Provider  ALPRAZolam Duanne Moron) 0.5 MG tablet Take 1 tablet (0.5 mg total) by mouth at bedtime. 05/16/17  Yes Arrien, Jimmy Picket, MD  atorvastatin (LIPITOR) 80 MG  tablet TAKE 1/2 TABLET BY MOUTH DAILY Patient taking differently: TAKE 1/2 TABLET (40 MG) BY MOUTH DAILY AT BEDTIME 08/07/15  Yes Satira Sark, MD  azelastine (ASTELIN) 0.1 % nasal spray Place 2 sprays into both nostrils 2 (two) times daily as needed (congestion).  02/21/17  Yes [provider]  brimonidine (ALPHAGAN) 0.2 % ophthalmic solution Place 1 drop into the right eye 2 (two) times daily.  04/21/15  Yes [provider]  brinzolamide (AZOPT) 1 % ophthalmic suspension Place 1 drop into both eyes 2 (two) times daily.   Yes [provider]  HYDROcodone-acetaminophen (NORCO/VICODIN) 5-325 MG tablet Take 1 tablet by mouth 3 (three) times daily as needed for moderate pain. 05/16/17  Yes  Arrien, Jimmy Picket, MD  latanoprost (XALATAN) 0.005 % ophthalmic solution Place 1 drop into the right eye at bedtime.  03/30/12  Yes [provider]  nitroGLYCERIN (NITROSTAT) 0.4 MG SL tablet Place 0.4 mg under the tongue every 5 (five) minutes as needed for chest pain (x 3 tablets daily). Reported on 12/28/2015   Yes [provider]  Omeprazole 20 MG TBEC Take 20 mg by mouth daily.    Yes [provider]  OXYGEN Inhale 2.5 L into the lungs at bedtime.   Yes [provider]  timolol (TIMOPTIC) 0.5 % ophthalmic solution Place 1 drop into both eyes 2 (two) times daily. 11/20/15  Yes [provider]  ambrisentan (LETAIRIS) 5 MG tablet Take 1 tablet (5 mg total) by mouth daily. 05/21/17   Collene Gobble, MD   No results found. - pertinent xrays, CT, MRI studies were reviewed and independently interpreted  Positive ROS: All other systems have been reviewed and were otherwise negative with the exception of those mentioned in the HPI and as above.  Physical Exam: General: Alert, no acute distress Psychiatric: Patient is competent for consent with normal mood and affect Lymphatic: No axillary or cervical lymphadenopathy Cardiovascular:  pedal edema bilateral lower extremities Respiratory: No cyanosis, no use of accessory musculature patient uses nasal cannula FiO2due to her chronic shortness of breath GI: No organomegaly, abdomen is soft and non-tender  Skin: examination patient has black eschar wounds on the medial thigh 2 and medial calf 1 of the left leg. There is surrounding dermatitis and drainage.   Neurologic: Patient does have protective sensation bilateral lower extremities.   MUSCULOSKELETAL:  Examination patient has a good dorsalis pedis pulse she has no pain with range of motion of the toes or ankle. She has a large black gangrenous ulcer over the medial calf and 2 ulcers over the medial thigh on the left. Her leg is extremely swollen  on the left compared the right. There is no purulent drainage.  Assessment: Assessment: Patient with multiple medical problems previously on a blood thinner sustaining a fall sustaining large hematomas and subsequently necrotic soft tissue envelope on the left lower extremity.  Plan: Plan: We'll plan on taking her to the operating room tomorrow Friday. Plan for excision of the black eschar wounds and all non-healthy nonviable soft tissue muscle and fascia. Patient may have necrotizing fasciitis however there is no crepitation with palpation around the skin. We'll plan on placement of an instillation wound VAC and then follow-up with surgery next week for repeat irrigation and debridement and will continue with installation therapy until patient has a healthy viable wound bed that we can place split thickness skin graft on. Anticipate at least one week of surgical intervention. Discussed with the patient  she is at risk of loss of limb or potential sepsis from the leg.   Thank you for the consult and the opportunity to see Ms. Unk Lightning, [place] [phone] 5:37 PM

## 2017-05-23 NOTE — Op Note (Signed)
06/06/2017 - 05/19/2017  3:05 PM  PATIENT:  [name]    PRE-OPERATIVE DIAGNOSIS:  INFECTED LEFT LEG  POST-OPERATIVE DIAGNOSIS:  Same  PROCEDURE:  IRRIGATION AND DEBRIDEMENT LEFT THIGH AND LEFT LEG Excision skin soft tissue fat and fascia of an area 39 x 20 cm medial aspect left thigh and left leg 5 cm deep  SURGEON:  [name], MD  PHYSICIAN ASSISTANT:None ANESTHESIA:   General  PREOPERATIVE INDICATIONS:  [name] is a  82 y.o. female with a diagnosis of INFECTED LEFT LEG who failed conservative measures and elected for surgical management.    The risks benefits and alternatives were discussed with the patient preoperatively including but not limited to the risks of infection, bleeding, nerve injury, cardiopulmonary complications, the need for revision surgery, among others, and the patient was willing to proceed.  OPERATIVE IMPLANTS: 3 of the instillation wound VAC reticulated foam dressings  OPERATIVE FINDINGS: Necrotic muscle and skin with no necrotizing fasciitis  OPERATIVE PROCEDURE: Patient was brought the operating room and underwent a general anesthetic. After adequate levels anesthesia obtained patient's left lower extremity was prepped using Betadine paint and draped into a sterile field a timeout was called. The skin was ellipsed out over the necrotic skin area this left an area that was 39 x 20 cm encompassing the entire medial aspect of the left leg and left thigh. This did not go down to bone or joint the fascia was intact no necrotizing fasciitis. Electrocautery was used for hemostasis the wound was irrigated with 3 L of pulsatile lavage there is no abscess. 3 of the instillation wound VAC reticulated foam dressings were applied this was covered with an Ioban dressing the wound VAC had a good suction fit. This was set for 32 mL of instillation and 2 hours of soap time.  Anticipate dressing changes with wound care ostomy nursing on the floor return to  the operating room on Friday for evaluation for split-thickness skin graft.

## 2017-05-23 NOTE — Interval H&P Note (Signed)
History and Physical Interval Note:  06/12/2017 6:46 AM  [place]  has presented today for surgery, with the diagnosis of INFECTED LEFT LEG  The various methods of treatment have been discussed with the patient and family. After consideration of risks, benefits and other options for treatment, the patient has consented to  Procedure(s): IRRIGATION AND DEBRIDEMENT LEFT THIGH AND LEFT LEG (Left) as a surgical intervention .  The patient's history has been reviewed, patient examined, no change in status, stable for surgery.  I have reviewed the patient's chart and labs.  Questions were answered to the patient's satisfaction.     [name]

## 2017-05-23 NOTE — Progress Notes (Signed)
Glasses sent back to patient's floor by nurse tech.

## 2017-05-23 NOTE — Plan of Care (Signed)
Problem: Education: Goal: Knowledge of [facility] Education information/materials will improve Outcome: Progressing Plan of care discussed; pt voiced understanding.  Problem: Safety: Goal: Ability to remain free from injury will improve Outcome: Progressing Call light and personal items within reach.  Pt agrees to call staff for mobility assistance.

## 2017-05-23 NOTE — Consult Note (Signed)
   [facility] CM Inpatient Consult   05/30/2017  [place] 1934/12/31 [id number]    [facility] Care Management follow up.  Spoke with inpatient RNCM. Discussed that [name] is scheduled for surgery today on her left leg.  Inpatient LCSW is following for SNF placement when ready.  Will continue to follow and update [facility] team.    Marthenia Rolling, [place], RN,BSN [facility] Liaison [phone]

## 2017-05-24 LAB — BASIC METABOLIC PANEL
Anion gap: 10 (ref 5–15)
BUN: 30 mg/dL — AB (ref 6–20)
CO2: 31 mmol/L (ref 22–32)
Calcium: 9.1 mg/dL (ref 8.9–10.3)
Chloride: 96 mmol/L — ABNORMAL LOW (ref 101–111)
Creatinine, Ser: 0.86 mg/dL (ref 0.44–1.00)
GFR calc Af Amer: >60 mL/min (ref >60)
GLUCOSE: 131 mg/dL — AB (ref 65–99)
POTASSIUM: 4.7 mmol/L (ref 3.5–5.1)
Sodium: 137 mmol/L (ref 135–145)

## 2017-05-24 LAB — MAGNESIUM: Magnesium: 2.1 mg/dL (ref 1.7–2.4)

## 2017-05-24 NOTE — Progress Notes (Signed)
Progress Note    [name]  LOV:[id number] DOB: 03-18-1935  DOA: 06/05/2017 PCP: Redmond School, MD    Brief Narrative:   Chief complaint: Follow-up heart failure and hematoma of the leg  Medical records reviewed and are as summarized below:  [name] is an 82 y.o. female with a PMH of MI, Chronic Atrial Fibrillation, Chronic Diastolic CHF, Pulmonary HTN, severe Tricuspid Valve Regurgitation, chronic respiratory failure) on 2.5 L O2 at night) , CKD stage III   recent left traumatic left leg hematoma complicated by hypovolemic shock and acute blood loss anemia who was admitted 06/09/2017 with an acute on chronic diastolic CHF exacerbation associated with hypertension and elevated troponin. Of note, she was recently hospitalized 05/10/17-05/16/17 after falling and suffering from a left leg traumatic hematoma complicated by hypovolemic shock and acute blood loss anemia.  Assessment/Plan:   Principal problem:  Acute on chronic respiratory failure/Acute on Chronic Diastolic CHF in the setting of atrial fibrillation/demand ischemia Furosemide, Aldactone, losartan, and Letairis have been on hold secondary to hypotension. 2-D echo done 05/21/17 and showed EF 60-65 percent, atrial fibrillation.Respiratory status stable.  Acute blood loss anemia in the setting of traumatic hematoma Given 1 unit of PRBCs on 05/21/17. Hemoglobin stable.  Pulmonary HTN April 2017 RIGHT heart catheterization PAH; 59/17 mmHg. Respiratory status stable.  Paroxysmal Atrial fibrillation,(CHADS-VASC 5)  Rate controlled, currently in NSR. Eliquis held secondary to large LLE hematoma. Continue to hold blood thinners for now.  CAD, Elevated Troponin Troponin elevated mildly, but trend flat. No complaints of chest pains. Continue Lipitor.  Hypotension Continue midodrine. Blood pressure remains soft at times.  LEFT LEG Hematoma Eliquis reversed on 05/11/2017. Surgically debrided 06/18/2017. Now  with VAC. Plan to return to OR later in the week for split thickness skin graft.  Thrombocytopenia Monitoring. Appears to be intermittently chronic. Currently WNL.  CKD stage III(baseline Cr 0.8-1.1)    Creatinine consistent with baseline values.  Hypomagnesemia Supplemented.  Hypokalemia Monitor and replace as needed.  Obesity Body mass index is 40.03 kg/m.   Family Communication/Anticipated D/C date and plan/Code Status   DVT prophylaxis: SCDs ordered. Code Status: Full Code.  Family Communication: No family at bedside. Disposition Plan: Likely will need SNF.   Medical Consultants:    Orthopedic Surgery   Anti-Infectives:    None  Subjective:   Patient continues to have significant leg pain. She is thirsty, but is currently nothing by mouth for her surgery. No complaints of dyspnea present.  Objective:    Vitals:   05/20/2017 1957 05/24/17 0017 05/24/17 0500 05/24/17 0739  BP: 99/75 (!) 94/47 115/66 (!) 101/40  Pulse: 66 (!) 51 (!) 59 62  Resp: 16 15 (!) 21 12  Temp: 98.1 F (36.7 C) 97.9 F (36.6 C) (!) 97.4 F (36.3 C) 98.3 F (36.8 C)  TempSrc: Oral Axillary Oral Axillary  SpO2: 100% 96% 99% 99%  Weight:      Height:        Intake/Output Summary (Last 24 hours) at 05/24/17 0852 Last data filed at 05/24/17 0223  Gross per 24 hour  Intake           831.67 ml  Output              425 ml  Net           406.67 ml   Filed Weights   05/20/2017 1449 05/20/17 1729 05/22/17 0500  Weight: 101.6 kg (224 lb) 118.3 kg (260 lb  12.8 oz) 102.5 kg (226 lb)    Exam: General: No acute distress. Cardiovascular: Heart sounds show a regular rate, and rhythm. No gallops or rubs. No murmurs. No JVD. Lungs: Clear to auscultation bilaterally with good air movement. No rales, rhonchi or wheezes. Abdomen: Soft, nontender, nondistended with normal active bowel sounds. No masses. No hepatosplenomegaly. Skin: Extensive hematoma as pictured in progress note from  05/22/17. Extremities: No clubbing or cyanosis. No edema. Pedal pulses 2+.   Data Reviewed:   I have personally reviewed following labs and imaging studies:  Labs: Labs show the following:   Basic Metabolic Panel:  Recent Labs Lab 06/04/2017 1522 05/20/17 0502 05/22/17 0434 05/26/2017 0158 05/24/17 0644  NA 139 136 138 138 137  K 5.3* 5.1 3.7 3.8 4.7  CL 104 103 98* 98* 96*  CO2 28 27 33* 31 31  GLUCOSE 120* 90 91 90 131*  BUN 41* 39* 26* 29* 30*  CREATININE 1.14* 0.97 0.92 0.91 0.86  CALCIUM 9.4 8.9 8.7* 8.9 9.1  MG  --   --  1.5* 1.8 2.1   GFR Estimated Creatinine Clearance: 59.6 mL/min (by C-G formula based on SCr of 0.86 mg/dL). Coagulation profile  Recent Labs Lab 05/22/17 1416 05/31/2017 0638  INR 1.25 1.23    CBC:  Recent Labs Lab 06/08/2017 1522 05/21/17 0902 05/22/17 0812 05/22/17 1416  WBC 7.1 11.8*  --  10.2  NEUTROABS 4.5  --   --  8.1*  HGB 7.8* 7.3* 7.8* 8.3*  HCT 25.5* 23.2* 26.0* 26.3*  MCV 97.7 94.3  --  93.6  PLT 184 183  --  204   Cardiac Enzymes:  Recent Labs Lab 05/20/2017 1648 05/22/17 1912 06/11/2017 0158 05/30/2017 0638  TROPONINI 0.06* 0.06* 0.06* 0.05*   BAnemia work up:  Recent Labs  05/21/17 0902  VITAMINB12 517  FOLATE 10.8  FERRITIN 65  TIBC 265  IRON 15*  RETICCTPCT 4.4*   Sepsis Labs:  Recent Labs Lab 05/27/2017 1522 06/16/2017 1528 05/21/17 0902 05/22/17 1416  WBC 7.1  --  11.8* 10.2  LATICACIDVEN  --  0.7  --   --     Microbiology MRSA swab negative.  Procedures and diagnostic studies:  Dg Chest Port 1 View  Result Date: 06/10/2017 CLINICAL DATA:  Follow-up CHF EXAM: PORTABLE CHEST 1 VIEW COMPARISON:  05/22/2017 FINDINGS: Cardiac shadow is enlarged but stable. Aortic calcifications are seen. No focal infiltrate or sizable effusion is noted. No acute bony abnormality is seen. IMPRESSION: Stable cardiomegaly.  No acute abnormality noted. Electronically Signed   By: Inez Catalina M.D.   On: 06/01/2017 12:26     Medications:   . brimonidine  1 drop Right Eye BID  . brinzolamide  1 drop Both Eyes BID  . docusate sodium  100 mg Oral BID  . furosemide  40 mg Intravenous Daily  . Gerhardt's butt cream   Topical BID  . latanoprost  1 drop Right Eye QHS  . midodrine  5 mg Oral TID WC  . sodium chloride flush  3 mL Intravenous Q12H  . timolol  1 drop Both Eyes BID   Continuous Infusions: . sodium chloride    . sodium chloride    . sodium chloride    .  ceFAZolin (ANCEF) IV 2 g (05/24/17 0827)  . lactated ringers 10 mL/hr at 06/03/2017 1242  . methocarbamol (ROBAXIN)  IV       LOS: 5 days   [name]  [facility] Pager [phone]. If  unable to reach me by pager, please call my cell phone at [phone].  *Please refer to [email or web address], password [email or web address] to get updated schedule on who will round on this patient, as hospitalists switch teams weekly. If 7PM-7AM, please contact night-coverage at [email or web address], password [email or web address] for any overnight needs.  05/24/2017, 8:52 AM

## 2017-05-24 NOTE — Plan of Care (Signed)
Problem: Activity: Goal: Capacity to carry out activities will improve Outcome: Progressing Patient participated with physical therapy today per physical therapy note.

## 2017-05-24 NOTE — Progress Notes (Signed)
Patient ID: [name], female   DOB: [birth year], 82 y.o.   MRN: [id number] Postoperative day 1 excisional debridement of massive necrotic soft tissue medial left thigh and left calf. The instillation wound VAC is functioning well. Will see if wound ostomy and continence nursing can change the instillation VAC on Monday. If this is not an option I will take her back to surgery on Wednesday and we'll again plan for surgery on Friday.

## 2017-05-24 NOTE — Plan of Care (Signed)
Problem: Health Behavior/Discharge Planning: Goal: Ability to manage health-related needs will improve for discharge Outcome: Progressing Social work involved, per their last note plan is for SNF in [place] at discharge.

## 2017-05-24 NOTE — Progress Notes (Signed)
Physical Therapy Treatment Patient Details Name: [name] MRN: [id number] DOB: 1935-05-07 Today's Date: 05/24/2017    History of Present Illness Patient is an 82 yo female admitted 06/06/2017 from [facility] with hypotension, CHF exac, elevated troponin.  Recent hosp admit after fall with injury to LLE.     PMH:  Afib, CHF, CKD, HTN, arthritis, CAD, MI, obesity. S/P excisional debridement of massive necrotic soft tissue medial left thigh and left calf with wound VAC placement.    PT Comments    Patient able to tolerate ROM and bed mobility training today. Mod assist for roll towards left side, but only able to tolerate 1/4 roll towards Rt. Would like to progress towards seated balance and weight bearing as tolerated as soon as able. She was eager to perform exercises today and is beginning to move LLE with assistance. Continue with skilled PT progressing towards goals.   Follow Up Recommendations  SNF;Supervision/Assistance - 24 hour     Equipment Recommendations  None recommended by PT    Recommendations for Other Services       Precautions / Restrictions Precautions Precautions: Fall Restrictions Weight Bearing Restrictions: No    Mobility  Bed Mobility Overal bed mobility: Needs Assistance Bed Mobility: Rolling Rolling: Max assist;+2 for physical assistance         General bed mobility comments: Mod assist for roll towards left side with use of UEs on rail and assistance for RLE support over opperative extremity, pulling chuck to increase roll. Pt tolerated a 1/4 roll towards the right side, and was able to assist with bending LLE at knee and hip minimally to initiate with roll.  Transfers                 General transfer comment: NT  Ambulation/Gait                 Stairs            Wheelchair Mobility    Modified Rankin (Stroke Patients Only)       Balance                                             Cognition Arousal/Alertness: Awake/alert Behavior During Therapy: WFL for tasks assessed/performed;Anxious Overall Cognitive Status: Within Functional Limits for tasks assessed                                        Exercises General Exercises - Lower Extremity Ankle Circles/Pumps: AROM;Both;10 reps;Supine Quad Sets: Strengthening;Both;10 reps;Supine Heel Slides: AROM;Both;15 reps;Supine (AAROM with LLE) Straight Leg Raises: AROM;Strengthening;Both;10 reps;Supine (AAROM with LLE)    General Comments General comments (skin integrity, edema, etc.): Wound vac appears to be functioning well. Dressings intact. Fanny cream applied by RN during treatment today      Pertinent Vitals/Pain Pain Assessment: Faces Faces Pain Scale: Hurts little more Pain Location: LLE; and back Pain Descriptors / Indicators: Aching;Sore;Tender;Guarding Pain Intervention(s): Monitored during session;Repositioned;Limited activity within patient's tolerance    Home Living                      Prior Function            PT Goals (current goals can now be found in the care plan section)  Acute Rehab PT Goals Patient Stated Goal: None stated PT Goal Formulation: With patient Time For Goal Achievement: 05/23/2017 Potential to Achieve Goals: Fair Progress towards PT goals: Progressing toward goals    Frequency    Min 3X/week      PT Plan Current plan remains appropriate    Co-evaluation              AM-PAC PT "6 Clicks" Daily Activity  Outcome Measure  Difficulty turning over in bed (including adjusting bedclothes, sheets and blankets)?: A Lot Difficulty moving from lying on back to sitting on the side of the bed? : Unable Difficulty sitting down on and standing up from a chair with arms (e.g., wheelchair, bedside commode, etc,.)?: Unable Help needed moving to and from a bed to chair (including a wheelchair)?: Total Help needed walking in hospital room?: Total Help  needed climbing 3-5 steps with a railing? : Total 6 Click Score: 7    End of Session Equipment Utilized During Treatment: Oxygen Activity Tolerance: Patient limited by pain;Patient tolerated treatment well Patient left: in bed;with call bell/phone within reach;with family/visitor present Nurse Communication: Mobility status PT Visit Diagnosis: Other abnormalities of gait and mobility (R26.89);Muscle weakness (generalized) (M62.81);History of falling (Z91.81);Pain Pain - Right/Left: Left Pain - part of body: Leg     Time: 1243-1315 PT Time Calculation (min) (ACUTE ONLY): 32 min  Charges:  $Therapeutic Exercise: 8-22 mins $Therapeutic Activity: 8-22 mins                    G Codes:       Elayne Snare, Chonte [phone]    Ellouise Newer 05/24/2017, 2:07 PM

## 2017-05-24 NOTE — Plan of Care (Signed)
Problem: Education: Goal: Knowledge of [facility] Education information/materials will improve Outcome: Progressing Pain management and plan of care discussed.  Pt verbalizes understanding.  Problem: Safety: Goal: Ability to remain free from injury will improve Outcome: Progressing Bed alarm on, call light and personal items within reach.  Pt agrees to call staff for assistance.

## 2017-05-24 NOTE — Progress Notes (Signed)
RN to administer scheduled eye drops and patient able to answer orientation questions x4 appropriately at this time.

## 2017-05-24 NOTE — Progress Notes (Signed)
Patient alert with confusion at this time, re-oriented easily.  RN asked patient where she [place]  RN re-oriented patient explaining to patient she was in [facility].  RN and patient then discussed reason(s) why patient was currently in the hospital.  Patient was able to tell RN the year but could not remember month.  RN explained the month was October.  Patient stated, "I just feel so silly right now I am having trouble remembering these things".  RN provided reassurance to patient.

## 2017-05-24 NOTE — Social Work (Addendum)
CSW will continue to follow for discharge to [facility]. SNF will have bed Monday, per previous notes.  Elissa Hefty, LCSW Clinical Social Worker [phone]

## 2017-05-25 ENCOUNTER — Encounter (HOSPITAL_COMMUNITY): Payer: Self-pay | Admitting: Orthopedic Surgery

## 2017-05-25 LAB — TYPE AND SCREEN
ABO/RH(D): B NEG
ANTIBODY SCREEN: NEGATIVE
Unit division: 0
Unit division: 0

## 2017-05-25 LAB — BPAM RBC
Blood Product Expiration Date: 201810192359
Blood Product Expiration Date: 201810242359
ISSUE DATE / TIME: 201810032351
UNIT TYPE AND RH: 1700
Unit Type and Rh: 1700

## 2017-05-25 LAB — MAGNESIUM: Magnesium: 2.1 mg/dL (ref 1.7–2.4)

## 2017-05-25 LAB — GLUCOSE, CAPILLARY: GLUCOSE-CAPILLARY: 126 mg/dL — AB (ref 65–99)

## 2017-05-25 MED ORDER — NALOXONE HCL 0.4 MG/ML IJ SOLN
INTRAMUSCULAR | Status: AC
  Filled 2017-05-25: qty 1

## 2017-05-25 NOTE — Progress Notes (Signed)
RN asked that patient's be assessed for mental status change.  Upon arrival, patient was alert, soft spoken but did answer appropriately to her name and birth date A/O x 2,patient has been confused.  Patient was able move all extremities to commands, no focal sensory loss.  + cough/gag.  Pupils reactive. VS were stable and not in acute distress.   RN paged [facility] NP, per NP will follow.

## 2017-05-25 NOTE — Progress Notes (Signed)
Patient ID: [name], female   DOB: [birth year], 82 y.o.   MRN: [id number] Patient is postoperative day 2 status post debridement massive necrotic ulcer involving left thigh and left calf. The instillation wound VAC is functioning well. Plan for wound ostomy continence nursing to change the [facility] Monday with the reticulated foam wound VAC dressing, I am planning for returning to the operating room on Friday for possible skin graft versus further debridement.

## 2017-05-25 NOTE — Progress Notes (Signed)
RN into room to assess patient.  Patient was sleeping as evidenced by eyes closed and snoring heard.  Patient awakened when RN squeezed patient's hand and called patient's name multiple times.  Patient not answering RN's questions when first awoke.  Vital signs taken and stable (see vitals charted at 0050).  Rapid Response RN on unit and came to patient's bedside. Patient following commands.  Grip strength moderate and equal bilaterally. No new acute neuro deficits noted by this RN and/or Rapid Response RN.  [facility] paged and was updated.  No new orders received at this time.  Will continue to monitor.

## 2017-05-25 NOTE — Progress Notes (Signed)
Progress Note    [name]  VZD:[id number] DOB: 1934-09-26  DOA: 06/04/2017 PCP: Redmond School, MD    Brief Narrative:   Chief complaint: Follow-up heart failure and hematoma of the leg  Medical records reviewed and are as summarized below:  [name] is an 82 y.o. female with a PMH of MI, Chronic Atrial Fibrillation, Chronic Diastolic CHF, Pulmonary HTN, severe Tricuspid Valve Regurgitation, chronic respiratory failure (on 2.5 L O2 at night) , CKD stage III   recent left traumatic left leg hematoma complicated by hypovolemic shock and acute blood loss anemia who was admitted 06/12/2017 with an acute on chronic diastolic CHF exacerbation associated with hypertension and elevated troponin. Of note, she was recently hospitalized 05/10/17-05/16/17 after falling and suffering from a left leg traumatic hematoma complicated by hypovolemic shock and acute blood loss anemia.She has undergone I&D and now has a wound VAC and will need a skin graft later in the week.  Assessment/Plan:   Principal problem:  Acute on chronic respiratory failure/Acute on Chronic Diastolic CHF in the setting of atrial fibrillation/demand ischemia Furosemide, Aldactone, losartan, and Letairis have been on hold secondary to hypotension. 2-D echo done 05/21/17 and showed EF 60-65 percent, atrial fibrillation. Respiratory status stable.  Acute blood loss anemia in the setting of traumatic hematoma Given 1 unit of PRBCs on 05/21/17. Recheck hemoglobin in the morning to ensure that it remains stable.  Pulmonary HTN April 2017 RIGHT heart catheterization PAH; 59/17 mmHg. Respiratory status stable.  Paroxysmal Atrial fibrillation,(CHADS-VASC 5)  Rate controlled, currently in NSR. Eliquis held secondary to large LLE hematoma. Continue to hold blood thinners for now.  CAD, Elevated Troponin Troponin elevated mildly, but trend flat. Continue Lipitor. Has not had any chest pain suggestive of acute coronary  syndrome.  Hypotension Continue midodrine. Blood pressure remains soft at times.  LEFT LEG Hematoma Eliquis reversed on 05/11/2017. Surgically debrided 05/23/17. Now with VAC. Plan to return to OR later in the week for split thickness skin graft. Beginning to have some discomfort in her left heel and I'm worried about her developing pressure sores in this area. We'll place heel protectors on.  Thrombocytopenia Monitoring. Appears to be intermittently chronic. Currently WNL.  CKD stage III(baseline Cr 0.8-1.1)    Creatinine consistent with baseline values.  Hypomagnesemia Supplemented. Remains WNL.  Hypokalemia Monitor and replace as needed.  Obesity Body mass index is 40.03 kg/m.   Family Communication/Anticipated D/C date and plan/Code Status   DVT prophylaxis: Anticoagulation contraindicated given recent traumatic hematoma, unable to wear SCDs on left. Code Status: Full Code.  Family Communication: Husband updated at bedside. Disposition Plan: Likely will need SNF.   Medical Consultants:    Orthopedic Surgery   Anti-Infectives:    None  Subjective:   Patient is a bit upset today as she was unable to locate her call bell and needed assistance. They have some short-term memory impairment as she did not remember the nurse coming in to offer setting her up for breakfast and wanting to wait until later. Has some left heel discomfort. No reports of chest pain or shortness of breath.  Objective:    Vitals:   05/24/17 1959 05/25/17 0000 05/25/17 0050 05/25/17 0409  BP: (!) 132/55 (!) 93/47 107/69 (!) 113/52  Pulse: 73  81 81  Resp: 16 14 16 19   Temp: 98.2 F (36.8 C) 98.5 F (36.9 C)  98.5 F (36.9 C)  TempSrc: Oral Axillary  Oral  SpO2: 97% 100% 100%  98%  Weight:      Height:        Intake/Output Summary (Last 24 hours) at 05/25/17 0816 Last data filed at 05/25/17 0525  Gross per 24 hour  Intake             1146 ml  Output             1340 ml    Net             -194 ml   Filed Weights   06/16/2017 1449 05/20/17 1729 05/22/17 0500  Weight: 101.6 kg (224 lb) 118.3 kg (260 lb 12.8 oz) 102.5 kg (226 lb)    Exam: General: No acute distress. Cardiovascular: Heart sounds show a regular rate, and rhythm. No gallops or rubs. No murmurs. No JVD. Lungs: Diminished but clear. No rales, rhonchi or wheezes. Abdomen: Soft, nontender, nondistended with normal active bowel sounds. No masses. No hepatosplenomegaly. Skin: Warm and dry. No rashes or lesions. Extremities: Left lower extremity wound VAC in place with extensive debridement done. No clubbing, cyanosis or edema to the right lower extremity. Left foot is edematous.  Data Reviewed:   I have personally reviewed following labs and imaging studies:  Labs: Labs show the following:   Basic Metabolic Panel:  Recent Labs Lab 05/24/2017 1522 05/20/17 0502 05/22/17 0434 05/29/2017 0158 05/24/17 0644 05/25/17 0404  NA 139 136 138 138 137  --   K 5.3* 5.1 3.7 3.8 4.7  --   CL 104 103 98* 98* 96*  --   CO2 28 27 33* 31 31  --   GLUCOSE 120* 90 91 90 131*  --   BUN 41* 39* 26* 29* 30*  --   CREATININE 1.14* 0.97 0.92 0.91 0.86  --   CALCIUM 9.4 8.9 8.7* 8.9 9.1  --   MG  --   --  1.5* 1.8 2.1 2.1   GFR Estimated Creatinine Clearance: 59.6 mL/min (by C-G formula based on SCr of 0.86 mg/dL). Coagulation profile  Recent Labs Lab 05/22/17 1416 06/16/2017 0638  INR 1.25 1.23    CBC:  Recent Labs Lab 06/09/2017 1522 05/21/17 0902 05/22/17 0812 05/22/17 1416  WBC 7.1 11.8*  --  10.2  NEUTROABS 4.5  --   --  8.1*  HGB 7.8* 7.3* 7.8* 8.3*  HCT 25.5* 23.2* 26.0* 26.3*  MCV 97.7 94.3  --  93.6  PLT 184 183  --  204   Cardiac Enzymes:  Recent Labs Lab 05/29/2017 1648 05/22/17 1912 05/20/2017 0158 06/14/2017 0638  TROPONINI 0.06* 0.06* 0.06* 0.05*   BAnemia work up: No results for input(s): VITAMINB12, FOLATE, FERRITIN, TIBC, IRON, RETICCTPCT in the last 72 hours. Sepsis  Labs:  Recent Labs Lab 06/07/2017 1522 05/24/2017 1528 05/21/17 0902 05/22/17 1416  WBC 7.1  --  11.8* 10.2  LATICACIDVEN  --  0.7  --   --     Microbiology MRSA swab negative.  Procedures and diagnostic studies:  Dg Chest Port 1 View  Result Date: 06/03/2017 CLINICAL DATA:  Follow-up CHF EXAM: PORTABLE CHEST 1 VIEW COMPARISON:  05/30/2017 FINDINGS: Cardiac shadow is enlarged but stable. Aortic calcifications are seen. No focal infiltrate or sizable effusion is noted. No acute bony abnormality is seen. IMPRESSION: Stable cardiomegaly.  No acute abnormality noted. Electronically Signed   By: Inez Catalina M.D.   On: 06/11/2017 12:26    Medications:   . brimonidine  1 drop Right Eye BID  . brinzolamide  1 drop Both  Eyes BID  . docusate sodium  100 mg Oral BID  . furosemide  40 mg Intravenous Daily  . Gerhardt's butt cream   Topical BID  . latanoprost  1 drop Right Eye QHS  . midodrine  5 mg Oral TID WC  . naloxone      . sodium chloride flush  3 mL Intravenous Q12H  . timolol  1 drop Both Eyes BID   Continuous Infusions: . sodium chloride    . sodium chloride    . sodium chloride    . lactated ringers 10 mL/hr at 05/20/2017 1242  . methocarbamol (ROBAXIN)  IV       LOS: 6 days   RAMA,CHRISTINA  [facility] Pager [phone]. If unable to reach me by pager, please call my cell phone at [phone].  *Please refer to [email or web address], password [email or web address] to get updated schedule on who will round on this patient, as hospitalists switch teams weekly. If 7PM-7AM, please contact night-coverage at [email or web address], password [email or web address] for any overnight needs.  05/25/2017, 8:16 AM

## 2017-05-25 NOTE — Progress Notes (Signed)
Unable to obtain patient's weight.  Bed scale inaccurate (reading -90lbs), patient unable to stand, not appropriate to use lift to obtain weight due to patient's left leg wound.

## 2017-05-26 ENCOUNTER — Encounter: Payer: Self-pay | Admitting: Licensed Clinical Social Worker

## 2017-05-26 ENCOUNTER — Other Ambulatory Visit: Payer: Self-pay | Admitting: Licensed Clinical Social Worker

## 2017-05-26 LAB — BLOOD GAS, ARTERIAL
Acid-Base Excess: 8.2 mmol/L — ABNORMAL HIGH (ref 0.0–2.0)
Bicarbonate: 33.7 mmol/L — ABNORMAL HIGH (ref 20.0–28.0)
DRAWN BY: 39899
O2 Content: 3 L/min
O2 SAT: 96.6 %
PCO2 ART: 61.1 mmHg — AB (ref 32.0–48.0)
PH ART: 7.36 (ref 7.350–7.450)
PO2 ART: 84.9 mmHg (ref 83.0–108.0)
Patient temperature: 98.6

## 2017-05-26 LAB — BASIC METABOLIC PANEL
Anion gap: 9 (ref 5–15)
BUN: 52 mg/dL — ABNORMAL HIGH (ref 6–20)
CALCIUM: 8.8 mg/dL — AB (ref 8.9–10.3)
CO2: 32 mmol/L (ref 22–32)
Chloride: 92 mmol/L — ABNORMAL LOW (ref 101–111)
Creatinine, Ser: 1.17 mg/dL — ABNORMAL HIGH (ref 0.44–1.00)
GFR, EST AFRICAN AMERICAN: 50 mL/min — AB (ref >60)
GFR, EST NON AFRICAN AMERICAN: 43 mL/min — AB (ref >60)
Glucose, Bld: 97 mg/dL (ref 65–99)
POTASSIUM: 4.8 mmol/L (ref 3.5–5.1)
Sodium: 133 mmol/L — ABNORMAL LOW (ref 135–145)

## 2017-05-26 LAB — CBC
HCT: 20.9 % — ABNORMAL LOW (ref 36.0–46.0)
HEMOGLOBIN: 6.6 g/dL — AB (ref 12.0–15.0)
MCH: 29.3 pg (ref 26.0–34.0)
MCHC: 31.6 g/dL (ref 30.0–36.0)
MCV: 92.9 fL (ref 78.0–100.0)
Platelets: 295 10*3/uL (ref 150–400)
RBC: 2.25 MIL/uL — AB (ref 3.87–5.11)
RDW: 14.9 % (ref 11.5–15.5)
WBC: 21.3 10*3/uL — ABNORMAL HIGH (ref 4.0–10.5)

## 2017-05-26 LAB — PREPARE RBC (CROSSMATCH)

## 2017-05-26 LAB — MAGNESIUM: Magnesium: 2 mg/dL (ref 1.7–2.4)

## 2017-05-26 MED ORDER — SODIUM CHLORIDE 0.9 % IV SOLN: Freq: Once | INTRAVENOUS | Status: DC

## 2017-05-26 NOTE — Care Management Note (Addendum)
Case Management Note  Patient Details  Name: [name] MRN: [id number] Date of Birth: 12/07/1934  Subjective/Objective:  Pt presented for Fall and suffered from a left leg traumatic hematoma complicated by hypovolemic shock and acute blood loss. Surgically debrided 05/25/2017 now with VAC. Plan to return to OR later in the week for split thickness skin graft.  PT/OT recommendations for SNF. CSW is following for disposition needs.                  Action/Plan: CM will continue to monitor for additional needs.   Expected Discharge Date:                  Expected Discharge Plan:  [place]  In-House Referral:  Clinical Social Work  Discharge planning Services  CM Consult  Post Acute Care Choice:  NA Choice offered to:  NA  DME Arranged:  N/A DME Agency:  NA  HH Arranged:  NA HH Agency:  NA  Status of Service:  Completed, signed off  If discussed at [employer] of Stay Meetings, dates discussed:  05-27-17, 05-29-17, 06-05-17  Additional Comments: 0843 06-05-17 Jacqlyn Krauss, RN, BSN [phone] S/p debridement 05/31/2017-developed acute GI Bleed and underwent endoscopy. Transferred to 27M-Intubated and pressors. CM will continue to monitor for disposition needs. CSW following for SNF    1150 06/14/2017 Jacqlyn Krauss, [place] [phone] Pt post Debridement- Wound VAC in place. Plan for SNF placement once stable. CM will continue to monitor.    1629 05-27-17 Jacqlyn Krauss, RN,BSN [phone] CSW following for SNF-likely [facility] SNF once stable. Plan for Split thickness graft 06/16/2017. CM will continue to monitor.  Bethena Roys, RN 05/26/2017, 3:26 PM

## 2017-05-26 NOTE — Progress Notes (Signed)
PT Cancellation Note  Patient Details Name: [name] MRN: [id number] DOB: 1935-06-24   Cancelled Treatment:    Reason Eval/Treat Not Completed: Medical issues which prohibited therapy.   Hgb is very low along with low BP and will continue on with therapy after transfusion pending better lab values.  Check later as time and pt allow.   Ramond Dial 05/26/2017, 10:51 AM   Mee Hives, PT MS Acute Rehab Dept. Number: [place] [facility] [place]

## 2017-05-26 NOTE — Care Management Important Message (Signed)
Important Message  Patient Details  Name: [name] MRN: [id number] Date of Birth: [birth year]   Medicare Important Message Given:  Yes    Nathen May 05/26/2017, 8:51 AM

## 2017-05-26 NOTE — Plan of Care (Signed)
Problem: Education: Goal: Knowledge of [facility] Education information/materials will improve Outcome: Progressing Plan of care discussed; pt verbalizes understanding.  However, she is forgetful and may need reinforcement.

## 2017-05-26 NOTE — Patient Outreach (Signed)
Assessment:  CSW received referral on [place]. CSW completed chart review on this client on 05/26/17.  Client sees Dr. Gerarda Fraction as primary care doctor.  Client has recently been receiving care at [facility] in [place], [place]. She is hoping to discharge soon from that hospital to go to a skilled nursing facility in [place], [place].  Hospital CSW has been in contact with [facility] in [place], [place]. Client is hoping to be able to admit to [facility] upon discharge from [place] spoke via phone with Johney Frame on 05/26/17. CSW verified identity of [place]. CSW received verbal permission from client on 05/26/17 for CSW to speak with client about current client needs and status. Client has support from her spouse. She said that her spouse often drives her to and from her scheduled medical appointments. She spoke of recent surgery on her left leg.  CSW discussed [facility] program services with client. CSW and client completed needed [facility] assessments for client.  CSW and client spoke of client care plan. CSW encouraged client to participate in scheduled client physical therapy sessions for client for next 30 days at skilled nursing center (upon client discharge from the hospital). Client said she is already doing some upper body exercises at the hospital. She said she thinks that further physical therapy services for her could be helpful (upon admission to nursing center).  Client said that her spouse is very supportive.  Client sees Dr. Gerarda Fraction for primary medical care when in the community. CSW encouraged client to communicate with hospital social worker to finalize client discharge plans.  CSW thanked client for phone call with CSW on 05/26/17.  CSW encouraged client to call CSW at [phone] as needed to discuss social work needs of client. Client was very appreciative of call from [place] on 05/26/17.  Plan:  Client to participate in  scheduled client physical therapy sessions for client for the next 30 days at skilled nursing center (upon client discharge from the hospital)  CSW to call client in 3 weeks to assess client needs at that time.  Norva Riffle.Tanice Petre MSW, LCSW Licensed Clinical Social Worker [facility] Care Management [phone]

## 2017-05-26 NOTE — Progress Notes (Signed)
[place]  NLG:[id number] DOB: 1935/08/05 DOA: 06/01/2017 PCP: Redmond School, MD    Brief Narrative:  82 y.o. female with a Hx of MI, Chronic Atrial Fibrillation, Chronic Diastolic CHF, Pulmonary HTN, severe Tricuspid Valve Regurgitation, chronic respiratory failure (on 2.5 L O2 at night) , CKD stage III, and a recent traumatic left leg hematoma complicated by hypovolemic shock and acute blood loss anemia requiring admission 05/10/17-05/16/17 (she has undergone I&D and now has a wound VAC and will need a skin graft later in the week.) who was again admitted 06/10/2017 with an acute on chronic diastolic CHF exacerbation associated with hypotension and elevated troponin.   Subjective: The patient reports that she is not sleeping well due to pain in her left leg.  She also admits to having a very poor appetite.  She denies nausea vomiting or significant abdominal pain.  She denies shortness of breath or chest pain.  Assessment & Plan:  Acute on chronic hypoxic respiratory failure / Acute on Chronic Diastolic CHF in the setting of atrial fibrillation / demand ischemia TTE 05/21/17 noted EF 60-65% - CHF presently well compensated - net negative approximately 2 L for this hospital stay Filed Weights   05/31/2017 1449 05/20/17 1729 05/22/17 0500  Weight: 101.6 kg (224 lb) 118.3 kg (260 lb 12.8 oz) 102.5 kg (226 lb)  .  LEFT LEG Hematoma Eliquis reversed on 05/11/2017. Surgically debrided 06/18/2017. Now with VAC. Plan to return to OR later in the week for split thickness skin graft. Dr. Sharol Given attending to this issue.    Acute blood loss anemia in the setting of traumatic hematoma Given 1 unit of PRBCs on 05/21/17 - Hgb has drifted back down, likely due to weeping from wound and poor nutritional status - 2U PRBC to be given today - follow trend - encouraged increased intake   Pulmonary HTN April 2017 RIGHT heart catheterization PAH 59/17 mmHg - no volume  overload at this time   Paroxysmal Atrial fibrillation (CHADS-VASC 5)  Rate controlled at this time - no anticoagulation in setting of large bleeding event  CAD, Elevated Troponin Troponin elevated mildly, but trend flat.no symptoms to suggest acute coronary syndrome  Hypotension Most consistent with hypovolemia due to acute blood loss - transfuse in today - continue midodrine  Acute kidney injury on CKD stage III (baseline Cr 0.8-1.1)  Acute increase in creatinine likely prerenal due to acute blood loss - transfuse in today - follow  Recent Labs Lab 05/20/17 0502 05/22/17 0434 05/31/2017 0158 05/24/17 0644 05/26/17 0321  CREATININE 0.97 0.92 0.91 0.86 1.17*    Obesity Body mass index is 40.03 kg/m     DVT prophylaxis: R LE SCD Code Status: FULL CODE Family Communication: spoke with daughter at bedside  Disposition Plan:   Consultants:  Dr. Sharol Given   Antimicrobials:  none  Objective: Blood pressure (!) 98/48, pulse 78, temperature 99.1 F (37.3 C), temperature source Oral, resp. rate (!) 22, height 5\' 3"  (1.6 m), weight 102.5 kg (226 lb), SpO2 100 %.  Intake/Output Summary (Last 24 hours) at 05/26/17 1204 Last data filed at 05/26/17 1001  Gross per 24 hour  Intake              572 ml  Output             1850 ml  Net            -1278 ml   [employer]  05/29/2017 1449 05/20/17 1729 05/22/17 0500  Weight: 101.6 kg (224 lb) 118.3 kg (260 lb 12.8 oz) 102.5 kg (226 lb)    Examination: General: No acute respiratory distress Lungs: Clear to auscultation bilaterally without wheezes or crackles Cardiovascular: irregularly irregular with rate controlled with no appreciable murmur Abdomen: Nontender, nondistended, soft, bowel sounds positive, no rebound, no ascites, no appreciable mass Extremities:no significant right lower extremity edema - left lower extremity in extensive wound VAC dressing  CBC:  Recent Labs Lab 06/13/2017 1522 05/21/17 0902 05/22/17 0812  05/22/17 1416 05/26/17 0321  WBC 7.1 11.8*  --  10.2 21.3*  NEUTROABS 4.5  --   --  8.1*  --   HGB 7.8* 7.3* 7.8* 8.3* 6.6*  HCT 25.5* 23.2* 26.0* 26.3* 20.9*  MCV 97.7 94.3  --  93.6 92.9  PLT 184 183  --  204 195   Basic Metabolic Panel:  Recent Labs Lab 05/20/17 0502 05/22/17 0434 06/11/2017 0158 05/24/17 0644 05/25/17 0404 05/26/17 0321  NA 136 138 138 137  --  133*  K 5.1 3.7 3.8 4.7  --  4.8  CL 103 98* 98* 96*  --  92*  CO2 27 33* 31 31  --  32  GLUCOSE 90 91 90 131*  --  97  BUN 39* 26* 29* 30*  --  52*  CREATININE 0.97 0.92 0.91 0.86  --  1.17*  CALCIUM 8.9 8.7* 8.9 9.1  --  8.8*  MG  --  1.5* 1.8 2.1 2.1 2.0   GFR: Estimated Creatinine Clearance: 43.8 mL/min (A) (by C-G formula based on SCr of 1.17 mg/dL (H)).  Liver Function Tests: No results for input(s): AST, ALT, ALKPHOS, BILITOT, PROT, ALBUMIN in the last 168 hours. No results for input(s): LIPASE, AMYLASE in the last 168 hours. No results for input(s): AMMONIA in the last 168 hours.  Coagulation Profile:  Recent Labs Lab 05/22/17 1416 06/16/2017 0638  INR 1.25 1.23    Cardiac Enzymes:  Recent Labs Lab 06/16/2017 1648 05/22/17 1912 06/16/2017 0158 05/22/2017 0638  TROPONINI 0.06* 0.06* 0.06* 0.05*    CBG:  Recent Labs Lab 05/25/17 0100  GLUCAP 126*    Recent Results (from the past 240 hour(s))  MRSA PCR Screening     Status: None   Collection Time: 05/20/17  6:37 PM  Result Value Ref Range Status   MRSA by PCR NEGATIVE NEGATIVE Final    Comment:        The GeneXpert MRSA Assay (FDA approved for NASAL specimens only), is one component of a comprehensive MRSA colonization surveillance program. It is not intended to diagnose MRSA infection nor to guide or monitor treatment for MRSA infections.   Surgical pcr screen     Status: None   Collection Time: 06/16/2017  6:38 AM  Result Value Ref Range Status   MRSA, PCR NEGATIVE NEGATIVE Final   Staphylococcus aureus NEGATIVE NEGATIVE  Final    Comment: (NOTE) The Xpert SA Assay (FDA approved for NASAL specimens in patients 10 years of age and older), is one component of a comprehensive surveillance program. It is not intended to diagnose infection nor to guide or monitor treatment.      Scheduled Meds: . brimonidine  1 drop Right Eye BID  . brinzolamide  1 drop Both Eyes BID  . docusate sodium  100 mg Oral BID  . furosemide  40 mg Intravenous Daily  . Gerhardt's butt cream   Topical BID  . latanoprost  1 drop  Right Eye QHS  . midodrine  5 mg Oral TID WC  . sodium chloride flush  3 mL Intravenous Q12H  . timolol  1 drop Both Eyes BID     LOS: 7 days   Cherene Altes, MD [facility] Office  [phone] Pager - Text Page per Shea Evans as per below:  On-Call/Text Page:      [email or web address]      password [email or web address]  If 7PM-7AM, please contact night-coverage [email or web address] Password [email or web address] 05/26/2017, 12:04 PM

## 2017-05-26 NOTE — Progress Notes (Signed)
Dr. Thereasa Solo text paged regarding observation that pt is without antibiotic with a  large wound area connected to a wound vac ad foul odor drainage . Requesting to address.

## 2017-05-26 NOTE — Progress Notes (Signed)
Physical Therapy Treatment Patient Details Name: [name] MRN: [id number] DOB: [birth year] Today's Date: 05/26/2017    History of Present Illness 82 yo female admitted 05/28/2017 from [facility] with hypotension, CHF exac, elevated troponin.  Recent hosp admit after fall with injury to LLE.     PMH:  Afib, CHF, CKD, HTN, arthritis, CAD, MI, obesity. S/P excisional debridement of massive necrotic soft tissue medial left thigh and left calf with wound VAC placement.    PT Comments    Pt was evaluated for her tolerance of there ex with yellow theraband, and noted her pulses were elevated over 90 with effort.  Talked with her about getting too tired from effort, to limit exercises to 5 reps and cautioned family to remind her as well.  Her plan is to follow up as needed acutely for strength and standing balance, and to use SNF time to progress with LE's as her healing process on LLE allows.   Follow Up Recommendations  SNF     Equipment Recommendations  None recommended by PT    Recommendations for Other Services       Precautions / Restrictions Precautions Precautions: Fall Precaution Comments: telemetry Restrictions Weight Bearing Restrictions: No    Mobility  Bed Mobility               General bed mobility comments: did not assist as pt is receiving blood  Transfers                 General transfer comment: did not assist as pt is receiving blood  Ambulation/Gait                 Stairs            Wheelchair Mobility    Modified Rankin (Stroke Patients Only)       Balance                                            Cognition Arousal/Alertness: Awake/alert Behavior During Therapy: WFL for tasks assessed/performed Overall Cognitive Status: Within Functional Limits for tasks assessed                                        Exercises General Exercises - Upper Extremity Shoulder Flexion:  AROM;Both;10 reps Shoulder ABduction: AROM;Strengthening;Both;10 reps Shoulder Horizontal ABduction: Strengthening;Both;10 reps    General Comments        Pertinent Vitals/Pain Pain Assessment: No/denies pain    Home Living                      Prior Function            PT Goals (current goals can now be found in the care plan section) Acute Rehab PT Goals Patient Stated Goal: None stated Progress towards PT goals: Progressing toward goals    Frequency           PT Plan Current plan remains appropriate    Co-evaluation              AM-PAC PT "6 Clicks" Daily Activity  Outcome Measure  Difficulty turning over in bed (including adjusting bedclothes, sheets and blankets)?: A Lot Difficulty moving from lying on back to sitting on the side of the bed? :  Unable Difficulty sitting down on and standing up from a chair with arms (e.g., wheelchair, bedside commode, etc,.)?: Unable Help needed moving to and from a bed to chair (including a wheelchair)?: Total Help needed walking in hospital room?: Total Help needed climbing 3-5 steps with a railing? : Total 6 Click Score: 7    End of Session Equipment Utilized During Treatment: Oxygen Activity Tolerance: Treatment limited secondary to medical complications (Comment) (pulses increased with UE effort) Patient left: in bed;with call bell/phone within reach;with family/visitor present Nurse Communication: Mobility status PT Visit Diagnosis: Other abnormalities of gait and mobility (R26.89);Muscle weakness (generalized) (M62.81);History of falling (Z91.81);Pain     Time: 9201-0071 PT Time Calculation (min) (ACUTE ONLY): 17 min  Charges:  $Therapeutic Exercise: 8-22 mins                    G Codes:  Functional Assessment Tool Used: AM-PAC 6 Clicks Basic Mobility     Ramond Dial 05/26/2017, 3:12 PM   Mee Hives, PT MS Acute Rehab Dept. Number: [place] and [place]

## 2017-05-26 NOTE — Progress Notes (Signed)
CSW following for SNF placement. CSW to confirm patient's bed with [facility]. Patient's first choice, [facility], has declined bed offer. CSW to support with discharge when ready.  Estanislado Emms, [place]

## 2017-05-27 ENCOUNTER — Other Ambulatory Visit (INDEPENDENT_AMBULATORY_CARE_PROVIDER_SITE_OTHER): Payer: Self-pay | Admitting: Family

## 2017-05-27 LAB — COMPREHENSIVE METABOLIC PANEL
ALK PHOS: 154 U/L — AB (ref 38–126)
ALT: 9 U/L — AB (ref 14–54)
ANION GAP: 8 (ref 5–15)
AST: 44 U/L — ABNORMAL HIGH (ref 15–41)
Albumin: 1.8 g/dL — ABNORMAL LOW (ref 3.5–5.0)
BUN: 47 mg/dL — ABNORMAL HIGH (ref 6–20)
CO2: 34 mmol/L — AB (ref 22–32)
CREATININE: 1.05 mg/dL — AB (ref 0.44–1.00)
Calcium: 8.9 mg/dL (ref 8.9–10.3)
Chloride: 94 mmol/L — ABNORMAL LOW (ref 101–111)
GFR, EST AFRICAN AMERICAN: 57 mL/min — AB (ref >60)
GFR, EST NON AFRICAN AMERICAN: 49 mL/min — AB (ref >60)
Glucose, Bld: 111 mg/dL — ABNORMAL HIGH (ref 65–99)
Potassium: 3.9 mmol/L (ref 3.5–5.1)
SODIUM: 136 mmol/L (ref 135–145)
Total Bilirubin: 1.1 mg/dL (ref 0.3–1.2)
Total Protein: 5.2 g/dL — ABNORMAL LOW (ref 6.5–8.1)

## 2017-05-27 LAB — CBC
HCT: 28.8 % — ABNORMAL LOW (ref 36.0–46.0)
Hemoglobin: 9.1 g/dL — ABNORMAL LOW (ref 12.0–15.0)
MCH: 28.9 pg (ref 26.0–34.0)
MCHC: 31.6 g/dL (ref 30.0–36.0)
MCV: 91.4 fL (ref 78.0–100.0)
PLATELETS: 276 10*3/uL (ref 150–400)
RBC: 3.15 MIL/uL — ABNORMAL LOW (ref 3.87–5.11)
RDW: 15.4 % (ref 11.5–15.5)
WBC: 18.3 10*3/uL — ABNORMAL HIGH (ref 4.0–10.5)

## 2017-05-27 MED ORDER — CHLORHEXIDINE GLUCONATE 4 % EX LIQD
60.0000 mL | Freq: Once | CUTANEOUS | Status: AC
  Administered 2017-05-28: 4 via TOPICAL
  Filled 2017-05-27: qty 60

## 2017-05-27 MED ORDER — CEFAZOLIN SODIUM-DEXTROSE 2-4 GM/100ML-% IV SOLN
2.0000 g | INTRAVENOUS | Status: AC
  Administered 2017-05-28: 2 g via INTRAVENOUS
  Filled 2017-05-27 (×2): qty 100

## 2017-05-27 NOTE — Plan of Care (Signed)
Problem: Activity: Goal: Risk for activity intolerance will decrease Outcome: Progressing Pt sat up on side of bed with PT/OT.

## 2017-05-27 NOTE — Anesthesia Postprocedure Evaluation (Signed)
Anesthesia Post Note  Patient: [name]  Procedure(s) Performed: IRRIGATION AND DEBRIDEMENT LEFT THIGH AND LEFT LEG (Left )     Patient location during evaluation: PACU Anesthesia Type: General Level of consciousness: awake and alert Pain management: pain level controlled Vital Signs Assessment: post-procedure vital signs reviewed and stable Respiratory status: spontaneous breathing, nonlabored ventilation, respiratory function stable and patient connected to nasal cannula oxygen Cardiovascular status: blood pressure returned to baseline and stable Postop Assessment: no apparent nausea or vomiting Anesthetic complications: no    Last Vitals:  Vitals:   05/27/17 0058 05/27/17 0400  BP: (!) 146/72 (!) 112/58  Pulse: 79 84  Resp: 18 18  Temp: 36.8 C 36.8 C  SpO2: 99% 91%    Last Pain:  Vitals:   05/27/17 0400  TempSrc: Oral  PainSc:    Pain Goal: Patients Stated Pain Goal: 0 (05/25/17 2324)               Audry Pili

## 2017-05-27 NOTE — Progress Notes (Signed)
Patient ID: [name], female   DOB: 1935-04-18, 82 y.o.   MRN: [id number] return to the operating room Wednesday and Friday further debridement of the left thigh and calf wound. Plan for repeat debridement and possible skin graft on Friday.

## 2017-05-27 NOTE — Progress Notes (Signed)
CSW discussed bed offer with [facility] admissions. [name] will likely offer bed, but will need updates when patient closer to discharge. CSW informed admissions of plan for skin graft this week. CSW to also discuss Medicare days left and possible need to go into copay days if patient needs more than covered days at [facility]. CSW to also give backup bed offers to patient and spouse. CSW to follow for disposition planning.  Estanislado Emms, [place]

## 2017-05-27 NOTE — Progress Notes (Signed)
[place] TEAM [place]  ZOX:[id number] DOB: [birth year] DOA: 06/16/2017 PCP: Redmond School, MD    Brief Narrative:  82 y.o. female with a Hx of MI, Chronic Atrial Fibrillation, Chronic Diastolic CHF, Pulmonary HTN, severe Tricuspid Valve Regurgitation, chronic respiratory failure (on 2.5 L O2 at night) , CKD stage III, and a recent traumatic left leg hematoma complicated by hypovolemic shock and acute blood loss anemia requiring admission 05/10/17-05/16/17 (she has undergone I&D and now has a wound VAC and will need a skin graft later in the week.) who was again admitted 05/27/2017 with an acute on chronic diastolic CHF exacerbation associated with hypotension and elevated troponin.   Subjective: The patient is resting comfortably in bed.  She has no new complaints today.  She denies chest pain shortness breath fevers chills nausea or vomiting.  She states that the pain in her left leg is much better controlled at this time.  Assessment & Plan:  Acute on chronic hypoxic respiratory failure / Acute on Chronic Diastolic CHF in the setting of atrial fibrillation / demand ischemia TTE 05/21/17 noted EF 60-65% - CHF well compensated - net negative approximately 2.2 L for this hospital stay - no change in tx plan today  Filed Weights   05/20/17 1729 05/22/17 0500 05/26/17 2200  Weight: 118.3 kg (260 lb 12.8 oz) 102.5 kg (226 lb) 106.1 kg (234 lb)    LEFT LEG Hematoma s/p fall on anticoag Eliquis reversed on 05/11/2017. Surgically debrided 06/18/2017. Now with VAC. Plan to return to OR 10/10 for I&D and then again 10/12 for split thickness skin graft. Dr. Sharol Given attending to this issue.    Acute blood loss anemia in the setting of traumatic hematoma Given 1 unit of PRBCs on 05/21/17 - Hgb drifted back down, likely due to weeping from wound and poor nutritional status - 2U PRBC given 10/8 - following trend - encouraged increased intake   Recent Labs Lab 05/21/17 0902  05/22/17 0812 05/22/17 1416 05/26/17 0321 05/27/17 0250  HGB 7.3* 7.8* 8.3* 6.6* 9.1*    Pulmonary HTN April 2017 RIGHT heart catheterization PAH 59/17 mmHg - no volume overload at this time   Paroxysmal Atrial fibrillation (CHADS-VASC 5)  Rate controlled at this time - no anticoagulation in setting of large bleeding event  CAD, Elevated Troponin Troponin elevated mildly, but trend flat.  No symptoms to suggest acute coronary syndrome  Hypotension Most consistent with hypovolemia due to acute blood loss - has improved significantly with blood transfusion  Acute kidney injury on CKD stage III (baseline Cr 0.8-1.1)  Acute increase in creatinine likely prerenal due to acute blood loss - creatinine improving posttransfusion - continue to follow  Recent Labs Lab 05/22/17 0434 06/05/2017 0158 05/24/17 0644 05/26/17 0321 05/27/17 0250  CREATININE 0.92 0.91 0.86 1.17* 1.05*    Obesity Body mass index is 40.03 kg/m     DVT prophylaxis: R LE SCD Code Status: FULL CODE Family Communication: spoke with husband at bedside  Disposition Plan: to OR 10/10  Consultants:  Dr. Sharol Given   Antimicrobials:  none  Objective: Blood pressure (!) 113/48, pulse 76, temperature 98.2 F (36.8 C), temperature source Oral, resp. rate 18, height 5\' 3"  (1.6 m), weight 106.1 kg (234 lb), SpO2 90 %.  Intake/Output Summary (Last 24 hours) at 05/27/17 1543 Last data filed at 05/27/17 1539  Gross per 24 hour  Intake              877  ml  Output             1950 ml  Net            -1073 ml   Filed Weights   05/20/17 1729 05/22/17 0500 05/26/17 2200  Weight: 118.3 kg (260 lb 12.8 oz) 102.5 kg (226 lb) 106.1 kg (234 lb)    Examination: General: No acute respiratory distress - alert and pleasant  Lungs: Clear to auscultation bilaterally - no wheezing or crackles  Cardiovascular: irregularly irregular - rate controlled  Abdomen: Nontender, nondistended, soft, bowel sounds positive, no  rebound Extremities:no significant right lower extremity edema - left lower extremity in extensive wound VAC dressing  CBC:  Recent Labs Lab 05/21/17 0902 05/22/17 0812 05/22/17 1416 05/26/17 0321 05/27/17 0250  WBC 11.8*  --  10.2 21.3* 18.3*  NEUTROABS  --   --  8.1*  --   --   HGB 7.3* 7.8* 8.3* 6.6* 9.1*  HCT 23.2* 26.0* 26.3* 20.9* 28.8*  MCV 94.3  --  93.6 92.9 91.4  PLT 183  --  204 295 130   Basic Metabolic Panel:  Recent Labs Lab 05/22/17 0434 06/18/2017 0158 05/24/17 0644 05/25/17 0404 05/26/17 0321 05/27/17 0250  NA 138 138 137  --  133* 136  K 3.7 3.8 4.7  --  4.8 3.9  CL 98* 98* 96*  --  92* 94*  CO2 33* 31 31  --  32 34*  GLUCOSE 91 90 131*  --  97 111*  BUN 26* 29* 30*  --  52* 47*  CREATININE 0.92 0.91 0.86  --  1.17* 1.05*  CALCIUM 8.7* 8.9 9.1  --  8.8* 8.9  MG 1.5* 1.8 2.1 2.1 2.0  --    GFR: Estimated Creatinine Clearance: 49.9 mL/min (A) (by C-G formula based on SCr of 1.05 mg/dL (H)).  Liver Function Tests:  Recent Labs Lab 05/27/17 0250  AST 44*  ALT 9*  ALKPHOS 154*  BILITOT 1.1  PROT 5.2*  ALBUMIN 1.8*    Coagulation Profile:  Recent Labs Lab 05/22/17 1416 05/29/2017 0638  INR 1.25 1.23    Cardiac Enzymes:  Recent Labs Lab 05/22/17 1912 05/19/2017 0158 06/08/2017 0638  TROPONINI 0.06* 0.06* 0.05*    CBG:  Recent Labs Lab 05/25/17 0100  GLUCAP 126*    Recent Results (from the past 240 hour(s))  MRSA PCR Screening     Status: None   Collection Time: 05/20/17  6:37 PM  Result Value Ref Range Status   MRSA by PCR NEGATIVE NEGATIVE Final    Comment:        The GeneXpert MRSA Assay (FDA approved for NASAL specimens only), is one component of a comprehensive MRSA colonization surveillance program. It is not intended to diagnose MRSA infection nor to guide or monitor treatment for MRSA infections.   Surgical pcr screen     Status: None   Collection Time: 05/30/2017  6:38 AM  Result Value Ref Range Status    MRSA, PCR NEGATIVE NEGATIVE Final   Staphylococcus aureus NEGATIVE NEGATIVE Final    Comment: (NOTE) The Xpert SA Assay (FDA approved for NASAL specimens in patients 76 years of age and older), is one component of a comprehensive surveillance program. It is not intended to diagnose infection nor to guide or monitor treatment.      Scheduled Meds: . brimonidine  1 drop Right Eye BID  . brinzolamide  1 drop Both Eyes BID  . docusate sodium  100  mg Oral BID  . Gerhardt's butt cream   Topical BID  . latanoprost  1 drop Right Eye QHS  . midodrine  5 mg Oral TID WC  . sodium chloride flush  3 mL Intravenous Q12H  . timolol  1 drop Both Eyes BID     LOS: 8 days   Cherene Altes, MD [facility] Office  [phone] Pager - Text Page per Shea Evans as per below:  On-Call/Text Page:      [email or web address]      password [email or web address]  If 7PM-7AM, please contact night-coverage [email or web address] Password [email or web address] 05/27/2017, 3:43 PM

## 2017-05-27 NOTE — Consult Note (Signed)
[place] nurse collaboration with Dr. Sharol Given yesterday, instillation therapy with extensive dressing change would be best with anesthesia.  Dr. Sharol Given will plan to change again iin the OR Wednesday.   Discussed POC with patient and bedside nurse.  Re consult if needed, will not follow at this time. Thanks  Sha Amer [employer], RN,CWOCN, CNS, [place] [phone])

## 2017-05-27 NOTE — Progress Notes (Signed)
Physical Therapy Treatment Patient Details Name: [name] MRN: [id number] DOB: September 22, 1934 Today's Date: 05/27/2017    History of Present Illness 82 yo female admitted 05/21/2017 from [facility] with hypotension, CHF exac, elevated troponin.  Recent hosp admit after fall with injury to LLE.     PMH:  Afib, CHF, CKD, HTN, arthritis, CAD, MI, obesity. S/P excisional debridement of massive necrotic soft tissue medial left thigh and left calf with wound VAC placement.    PT Comments    Pt motivated and agreeable to participation in therapy session. Co-Rx with OT with plans to sit EOB and eat breakfast. Pt dependent on BUE support in sitting and, therefore, unable to eat EOB. Pt able to sit x 5 minutes with min assist. Pt returned to supine at end of session.     Follow Up Recommendations  SNF     Equipment Recommendations  None recommended by PT    Recommendations for Other Services       Precautions / Restrictions Precautions Precautions: Fall;Other (comment) Precaution Comments: wound vac    Mobility  Bed Mobility Overal bed mobility: Needs Assistance Bed Mobility: Rolling Rolling: Max assist;+2 for physical assistance   Supine to sit: Max assist;+2 for physical assistance Sit to supine: Max assist;+2 for physical assistance   General bed mobility comments: use of bed pad to scoot to EOB  Transfers                 General transfer comment: unable  Ambulation/Gait             General Gait Details: unable   Stairs            Wheelchair Mobility    Modified Rankin (Stroke Patients Only)       Balance Overall balance assessment: Needs assistance Sitting-balance support: Bilateral upper extremity supported Sitting balance-Leahy Scale: Poor Sitting balance - Comments: Pt sat EOB x 5 minutes with min assist.                                    Cognition Arousal/Alertness: Awake/alert Behavior During Therapy: WFL for  tasks assessed/performed Overall Cognitive Status: Within Functional Limits for tasks assessed                                        Exercises      General Comments        Pertinent Vitals/Pain Pain Assessment: 0-10 Pain Score: 8  Pain Location: LLE during mobility Pain Descriptors / Indicators: Guarding;Grimacing;Sore Pain Intervention(s): Monitored during session;Limited activity within patient's tolerance;Repositioned    Home Living                      Prior Function            PT Goals (current goals can now be found in the care plan section) Acute Rehab PT Goals Patient Stated Goal: get better PT Goal Formulation: With patient Time For Goal Achievement: 06/07/2017 Potential to Achieve Goals: Fair Progress towards PT goals: Progressing toward goals    Frequency    Min 3X/week      PT Plan Current plan remains appropriate    Co-evaluation PT/OT/SLP Co-Evaluation/Treatment: Yes Reason for Co-Treatment: Complexity of the patient's impairments (multi-system involvement);For patient/therapist safety PT goals addressed during session: Mobility/safety  with mobility;Balance        AM-PAC PT "6 Clicks" Daily Activity  Outcome Measure  Difficulty turning over in bed (including adjusting bedclothes, sheets and blankets)?: A Lot Difficulty moving from lying on back to sitting on the side of the bed? : Unable Difficulty sitting down on and standing up from a chair with arms (e.g., wheelchair, bedside commode, etc,.)?: Unable Help needed moving to and from a bed to chair (including a wheelchair)?: Total Help needed walking in hospital room?: Total Help needed climbing 3-5 steps with a railing? : Total 6 Click Score: 7    End of Session Equipment Utilized During Treatment: Oxygen Activity Tolerance: Patient tolerated treatment well Patient left: in bed;with call bell/phone within reach Nurse Communication: Mobility status PT Visit  Diagnosis: Other abnormalities of gait and mobility (R26.89);Muscle weakness (generalized) (M62.81);History of falling (Z91.81);Pain Pain - Right/Left: Left Pain - part of body: Leg     Time: 3267-1245 PT Time Calculation (min) (ACUTE ONLY): 24 min  Charges:  $Therapeutic Activity: 8-22 mins                    G Codes:       Lorrin Goodell, PT  Office # [phone] Pager [phone]    [name] 05/27/2017, 9:43 AM

## 2017-05-27 NOTE — Progress Notes (Signed)
Occupational Therapy Treatment Patient Details Name: MARQUIA COSTELLO MRN: [id number] DOB: [birth year] Today's Date: 05/27/2017    History of present illness 82 yo female admitted 06/15/2017 from [facility] with hypotension, CHF exac, elevated troponin.  Recent hosp admit after fall with injury to LLE.     PMH:  Afib, CHF, CKD, HTN, arthritis, CAD, MI, obesity. S/P excisional debridement of massive necrotic soft tissue medial left thigh and left calf with wound VAC placement.   OT comments  Pt able to tolerate sitting EOB with x 5 minutes, needed support, with feature of mattress interfering with ability for pt to support herself. Pt needs extensive assist for bed mobility. Performed grooming and eating at bed level. Pt with disorientation to time, left blinds open.  Follow Up Recommendations  SNF;Supervision/Assistance - 24 hour    Equipment Recommendations       Recommendations for Other Services      Precautions / Restrictions Precautions Precautions: Fall Precaution Comments: wound vac Restrictions Weight Bearing Restrictions: No       Mobility Bed Mobility Overal bed mobility: Needs Assistance Bed Mobility: Rolling Rolling: Max assist;+2 for physical assistance   Supine to sit: Max assist;+2 for physical assistance Sit to supine: Max assist;+2 for physical assistance   General bed mobility comments: assist for all aspects  Transfers                 General transfer comment: unable    Balance Overall balance assessment: Needs assistance Sitting-balance support: Bilateral upper extremity supported Sitting balance-Leahy Scale: Poor Sitting balance - Comments: Pt sat EOB x 5 minutes with min assist.                                   ADL either performed or assessed with clinical judgement   ADL Overall ADL's : Needs assistance/impaired Eating/Feeding: Set up;Bed level   Grooming: Set up;Bed level;Wash/dry hands;Wash/dry face                                 General ADL Comments: pt unable to perform ADL in sitting at EOB despite mattress deflated      Vision       Perception     Praxis      Cognition Arousal/Alertness: Awake/alert Behavior During Therapy: WFL for tasks assessed/performed Overall Cognitive Status: Impaired/Different from baseline Area of Impairment: Orientation                 Orientation Level: Time             General Comments: having some difficulty recalling how to use phone        Exercises     Shoulder Instructions       General Comments      Pertinent Vitals/ Pain       Pain Assessment: 0-10 Pain Score: 8  Pain Location: LLE during mobility Pain Descriptors / Indicators: Guarding;Grimacing;Sore Pain Intervention(s): Monitored during session;Repositioned  Home Living                                          Prior Functioning/Environment              Frequency  Min 2X/week  Progress Toward Goals  OT Goals(current goals can now be found in the care plan section)  Progress towards OT goals: Progressing toward goals  Acute Rehab OT Goals Patient Stated Goal: get better Time For Goal Achievement: 06/05/17 Potential to Achieve Goals: Good  Plan Discharge plan remains appropriate    Co-evaluation      Reason for Co-Treatment: Complexity of the patient's impairments (multi-system involvement);For patient/therapist safety PT goals addressed during session: Mobility/safety with mobility;Balance        AM-PAC PT "6 Clicks" Daily Activity     Outcome Measure   Help from another person eating meals?: None Help from another person taking care of personal grooming?: A Little Help from another person toileting, which includes using toliet, bedpan, or urinal?: Total Help from another person bathing (including washing, rinsing, drying)?: A Lot Help from another person to put on and taking off regular upper body  clothing?: A Lot Help from another person to put on and taking off regular lower body clothing?: Total 6 Click Score: 13    End of Session Equipment Utilized During Treatment: Oxygen  OT Visit Diagnosis: Muscle weakness (generalized) (M62.81) Pain - part of body: Leg   Activity Tolerance Patient tolerated treatment well   Patient Left in bed;with call bell/phone within reach   Nurse Communication          Time: 1856-3149 OT Time Calculation (min): 23 min  Charges: OT General Charges $OT Visit: 1 Visit OT Treatments $Therapeutic Activity: 8-22 mins  05/27/2017 Nestor Lewandowsky, OTR/L Pager: Harvey, Haze Boyden 05/27/2017, 10:55 AM

## 2017-05-28 ENCOUNTER — Inpatient Hospital Stay (HOSPITAL_COMMUNITY): Payer: Medicare Other | Admitting: Anesthesiology

## 2017-05-28 ENCOUNTER — Encounter (HOSPITAL_COMMUNITY): Admission: EM | Disposition: E | Payer: Self-pay | Source: Home / Self Care | Attending: Internal Medicine

## 2017-05-28 ENCOUNTER — Encounter (HOSPITAL_COMMUNITY): Payer: Self-pay | Admitting: Anesthesiology

## 2017-05-28 DIAGNOSIS — D600 Chronic acquired pure red cell aplasia: Secondary | ICD-10-CM

## 2017-05-28 DIAGNOSIS — D708 Other neutropenia: Secondary | ICD-10-CM

## 2017-05-28 DIAGNOSIS — I2510 Atherosclerotic heart disease of native coronary artery without angina pectoris: Secondary | ICD-10-CM

## 2017-05-28 DIAGNOSIS — M726 Necrotizing fasciitis: Secondary | ICD-10-CM

## 2017-05-28 HISTORY — PX: I&D EXTREMITY: SHX5045

## 2017-05-28 LAB — CBC
HEMATOCRIT: 30.1 % — AB (ref 36.0–46.0)
Hemoglobin: 9 g/dL — ABNORMAL LOW (ref 12.0–15.0)
MCH: 28 pg (ref 26.0–34.0)
MCHC: 29.9 g/dL — AB (ref 30.0–36.0)
MCV: 93.5 fL (ref 78.0–100.0)
Platelets: 284 10*3/uL (ref 150–400)
RBC: 3.22 MIL/uL — ABNORMAL LOW (ref 3.87–5.11)
RDW: 15.1 % (ref 11.5–15.5)
WBC: 14.4 10*3/uL — AB (ref 4.0–10.5)

## 2017-05-28 LAB — BASIC METABOLIC PANEL
ANION GAP: 7 (ref 5–15)
BUN: 34 mg/dL — ABNORMAL HIGH (ref 6–20)
CO2: 36 mmol/L — ABNORMAL HIGH (ref 22–32)
Calcium: 8.9 mg/dL (ref 8.9–10.3)
Chloride: 93 mmol/L — ABNORMAL LOW (ref 101–111)
Creatinine, Ser: 0.66 mg/dL (ref 0.44–1.00)
GLUCOSE: 102 mg/dL — AB (ref 65–99)
POTASSIUM: 4.4 mmol/L (ref 3.5–5.1)
Sodium: 136 mmol/L (ref 135–145)

## 2017-05-28 SURGERY — IRRIGATION AND DEBRIDEMENT EXTREMITY
Anesthesia: General | Site: Leg Lower | Laterality: Left

## 2017-05-28 MED ORDER — ACETAMINOPHEN 325 MG PO TABS: 650.0000 mg | ORAL_TABLET | Freq: Four times a day (QID) | ORAL | Status: DC | PRN

## 2017-05-28 MED ORDER — OXYCODONE HCL 5 MG/5ML PO SOLN: 5.0000 mg | Freq: Once | ORAL | Status: DC | PRN

## 2017-05-28 MED ORDER — FENTANYL CITRATE (PF) 250 MCG/5ML IJ SOLN
INTRAMUSCULAR | Status: AC
  Filled 2017-05-28: qty 5

## 2017-05-28 MED ORDER — METHOCARBAMOL 1000 MG/10ML IJ SOLN: 500.0000 mg | Freq: Four times a day (QID) | INTRAVENOUS | Status: DC | PRN

## 2017-05-28 MED ORDER — POLYETHYLENE GLYCOL 3350 17 G PO PACK: 17.0000 g | PACK | Freq: Every day | ORAL | Status: DC | PRN

## 2017-05-28 MED ORDER — FENTANYL CITRATE (PF) 100 MCG/2ML IJ SOLN
INTRAMUSCULAR | Status: DC | PRN
  Administered 2017-05-28 (×4): 25 ug via INTRAVENOUS

## 2017-05-28 MED ORDER — BISACODYL 10 MG RE SUPP: 10.0000 mg | Freq: Every day | RECTAL | Status: DC | PRN

## 2017-05-28 MED ORDER — PROPOFOL 10 MG/ML IV BOLUS
INTRAVENOUS | Status: DC | PRN
  Administered 2017-05-28: 150 mg via INTRAVENOUS

## 2017-05-28 MED ORDER — ONDANSETRON HCL 4 MG PO TABS: 4.0000 mg | ORAL_TABLET | Freq: Four times a day (QID) | ORAL | Status: DC | PRN

## 2017-05-28 MED ORDER — MIDODRINE HCL 5 MG PO TABS
10.0000 mg | ORAL_TABLET | Freq: Three times a day (TID) | ORAL | Status: DC
  Administered 2017-05-29 – 2017-06-03 (×17): 10 mg via ORAL
  Filled 2017-05-28 (×19): qty 2

## 2017-05-28 MED ORDER — HYDROMORPHONE HCL 1 MG/ML IJ SOLN
1.0000 mg | INTRAMUSCULAR | Status: DC | PRN
  Administered 2017-05-28: 1 mg via INTRAVENOUS

## 2017-05-28 MED ORDER — 0.9 % SODIUM CHLORIDE (POUR BTL) OPTIME
TOPICAL | Status: DC | PRN
  Administered 2017-05-28: 1000 mL

## 2017-05-28 MED ORDER — DOCUSATE SODIUM 100 MG PO CAPS
100.0000 mg | ORAL_CAPSULE | Freq: Two times a day (BID) | ORAL | Status: DC
  Administered 2017-05-28 – 2017-05-29 (×3): 100 mg via ORAL
  Filled 2017-05-28: qty 1

## 2017-05-28 MED ORDER — PHENYLEPHRINE 40 MCG/ML (10ML) SYRINGE FOR IV PUSH (FOR BLOOD PRESSURE SUPPORT)
PREFILLED_SYRINGE | INTRAVENOUS | Status: AC
  Filled 2017-05-28: qty 10

## 2017-05-28 MED ORDER — OXYCODONE HCL 5 MG PO TABS: 5.0000 mg | ORAL_TABLET | Freq: Once | ORAL | Status: DC | PRN

## 2017-05-28 MED ORDER — OXYCODONE HCL 5 MG PO TABS: 5.0000 mg | ORAL_TABLET | ORAL | Status: DC | PRN

## 2017-05-28 MED ORDER — MIDAZOLAM HCL 2 MG/2ML IJ SOLN
INTRAMUSCULAR | Status: AC
  Filled 2017-05-28: qty 2

## 2017-05-28 MED ORDER — ONDANSETRON HCL 4 MG/2ML IJ SOLN: 4.0000 mg | Freq: Four times a day (QID) | INTRAMUSCULAR | Status: DC | PRN

## 2017-05-28 MED ORDER — MAGNESIUM CITRATE PO SOLN: 1.0000 | Freq: Once | ORAL | Status: DC | PRN

## 2017-05-28 MED ORDER — ACETAMINOPHEN 650 MG RE SUPP: 650.0000 mg | Freq: Four times a day (QID) | RECTAL | Status: DC | PRN

## 2017-05-28 MED ORDER — ONDANSETRON HCL 4 MG/2ML IJ SOLN
INTRAMUSCULAR | Status: AC
  Filled 2017-05-28: qty 2

## 2017-05-28 MED ORDER — METOCLOPRAMIDE HCL 5 MG PO TABS: 5.0000 mg | ORAL_TABLET | Freq: Three times a day (TID) | ORAL | Status: DC | PRN

## 2017-05-28 MED ORDER — PROMETHAZINE HCL 25 MG/ML IJ SOLN
INTRAMUSCULAR | Status: AC
  Filled 2017-05-28: qty 1

## 2017-05-28 MED ORDER — CEFAZOLIN SODIUM-DEXTROSE 2-4 GM/100ML-% IV SOLN
2.0000 g | Freq: Three times a day (TID) | INTRAVENOUS | Status: DC
  Administered 2017-05-28 – 2017-05-30 (×6): 2 g via INTRAVENOUS
  Filled 2017-05-28 (×8): qty 100

## 2017-05-28 MED ORDER — LIDOCAINE 2% (20 MG/ML) 5 ML SYRINGE
INTRAMUSCULAR | Status: AC
  Filled 2017-05-28: qty 5

## 2017-05-28 MED ORDER — LIDOCAINE HCL (CARDIAC) 20 MG/ML IV SOLN
INTRAVENOUS | Status: DC | PRN
  Administered 2017-05-28: 80 mg via INTRAVENOUS

## 2017-05-28 MED ORDER — METOCLOPRAMIDE HCL 5 MG/ML IJ SOLN: 5.0000 mg | Freq: Three times a day (TID) | INTRAMUSCULAR | Status: DC | PRN

## 2017-05-28 MED ORDER — LACTATED RINGERS IV SOLN
INTRAVENOUS | Status: DC
  Administered 2017-05-28 (×2): via INTRAVENOUS

## 2017-05-28 MED ORDER — METHOCARBAMOL 500 MG PO TABS: 500.0000 mg | ORAL_TABLET | Freq: Four times a day (QID) | ORAL | Status: DC | PRN

## 2017-05-28 MED ORDER — ONDANSETRON HCL 4 MG/2ML IJ SOLN
INTRAMUSCULAR | Status: DC | PRN
  Administered 2017-05-28: 4 mg via INTRAVENOUS

## 2017-05-28 MED ORDER — SODIUM CHLORIDE 0.9 % IV SOLN
INTRAVENOUS | Status: DC
  Administered 2017-05-28: 12:00:00 via INTRAVENOUS

## 2017-05-28 MED ORDER — PROMETHAZINE HCL 25 MG/ML IJ SOLN
6.2500 mg | INTRAMUSCULAR | Status: DC | PRN
  Administered 2017-05-28: 6.25 mg via INTRAVENOUS

## 2017-05-28 MED ORDER — SODIUM CHLORIDE 0.9 % IR SOLN
Status: DC | PRN
  Administered 2017-05-28: 3000 mL

## 2017-05-28 MED ORDER — PROPOFOL 10 MG/ML IV BOLUS
INTRAVENOUS | Status: AC
  Filled 2017-05-28: qty 20

## 2017-05-28 MED ORDER — PHENYLEPHRINE HCL 10 MG/ML IJ SOLN
INTRAMUSCULAR | Status: DC | PRN
  Administered 2017-05-28: 80 ug via INTRAVENOUS

## 2017-05-28 MED ORDER — HYDROMORPHONE HCL 1 MG/ML IJ SOLN: 0.2500 mg | INTRAMUSCULAR | Status: DC | PRN

## 2017-05-28 SURGICAL SUPPLY — 35 items
BLADE SURG 21 STRL SS (BLADE) ×3 IMPLANT
BNDG COHESIVE 6X5 TAN STRL LF (GAUZE/BANDAGES/DRESSINGS) ×3 IMPLANT
BNDG GAUZE ELAST 4 BULKY (GAUZE/BANDAGES/DRESSINGS) ×6 IMPLANT
CANISTER WOUND CARE 500ML ATS (WOUND CARE) ×3 IMPLANT
CASSETTE VERAFLO VERALINK (MISCELLANEOUS) ×3 IMPLANT
COVER SURGICAL LIGHT HANDLE (MISCELLANEOUS) ×6 IMPLANT
DRAPE U-SHAPE 47X51 STRL (DRAPES) ×3 IMPLANT
DRESSING ALLEVYN LIFE SACRUM (GAUZE/BANDAGES/DRESSINGS) ×3 IMPLANT
DRESSING VERAFLO CLEANSE CC (GAUZE/BANDAGES/DRESSINGS) ×3 IMPLANT
DRSG ADAPTIC 3X8 NADH LF (GAUZE/BANDAGES/DRESSINGS) ×3 IMPLANT
DRSG VERAFLO CLEANSE CC (GAUZE/BANDAGES/DRESSINGS) ×9
DURAPREP 26ML APPLICATOR (WOUND CARE) ×3 IMPLANT
ELECT REM PT RETURN 9FT ADLT (ELECTROSURGICAL)
ELECTRODE REM PT RTRN 9FT ADLT (ELECTROSURGICAL) IMPLANT
GAUZE SPONGE 4X4 12PLY STRL (GAUZE/BANDAGES/DRESSINGS) ×3 IMPLANT
GLOVE BIOGEL PI IND STRL 9 (GLOVE) ×1 IMPLANT
GLOVE BIOGEL PI INDICATOR 9 (GLOVE) ×2
GLOVE SURG ORTHO 9.0 STRL STRW (GLOVE) ×3 IMPLANT
GOWN STRL REUS W/ TWL XL LVL3 (GOWN DISPOSABLE) ×2 IMPLANT
GOWN STRL REUS W/TWL XL LVL3 (GOWN DISPOSABLE) ×6
HANDPIECE INTERPULSE COAX TIP (DISPOSABLE)
KIT BASIN OR (CUSTOM PROCEDURE TRAY) ×3 IMPLANT
KIT ROOM TURNOVER OR (KITS) ×3 IMPLANT
MANIFOLD NEPTUNE II (INSTRUMENTS) ×3 IMPLANT
NS IRRIG 1000ML POUR BTL (IV SOLUTION) ×3 IMPLANT
PACK ORTHO EXTREMITY (CUSTOM PROCEDURE TRAY) ×3 IMPLANT
PAD ARMBOARD 7.5X6 YLW CONV (MISCELLANEOUS) ×6 IMPLANT
SET HNDPC FAN SPRY TIP SCT (DISPOSABLE) IMPLANT
STOCKINETTE IMPERVIOUS 9X36 MD (GAUZE/BANDAGES/DRESSINGS) IMPLANT
SWAB COLLECTION DEVICE MRSA (MISCELLANEOUS) IMPLANT
SWAB CULTURE ESWAB REG 1ML (MISCELLANEOUS) IMPLANT
TOWEL OR 17X26 10 PK STRL BLUE (TOWEL DISPOSABLE) ×3 IMPLANT
TUBE CONNECTING 12'X1/4 (SUCTIONS) ×1
TUBE CONNECTING 12X1/4 (SUCTIONS) ×2 IMPLANT
YANKAUER SUCT BULB TIP NO VENT (SUCTIONS) ×3 IMPLANT

## 2017-05-28 NOTE — Anesthesia Procedure Notes (Signed)
Procedure Name: LMA Insertion Date/Time: 05/19/2017 10:09 AM Performed by: Scheryl Darter Pre-anesthesia Checklist: Patient identified, Emergency Drugs available, Suction available and Patient being monitored Patient Re-evaluated:Patient Re-evaluated prior to induction Oxygen Delivery Method: Circle System Utilized Preoxygenation: Pre-oxygenation with 100% oxygen Induction Type: IV induction Ventilation: Mask ventilation without difficulty LMA: LMA inserted LMA Size: 4.0 Number of attempts: 1 Placement Confirmation: positive ETCO2 Tube secured with: Tape Dental Injury: Teeth and Oropharynx as per pre-operative assessment

## 2017-05-28 NOTE — Anesthesia Preprocedure Evaluation (Signed)
Anesthesia Evaluation  Patient identified by MRN, date of birth, ID band Patient awake    Reviewed: Allergy & Precautions, H&P , NPO status , Patient's Chart, lab work & pertinent test results  History of Anesthesia Complications Negative for: history of anesthetic complications  Airway Mallampati: IV   Neck ROM: Full    Dental  (+) Teeth Intact, Dental Advisory Given   Pulmonary shortness of breath and Long-Term Oxygen Therapy, former smoker,     + decreased breath sounds      Cardiovascular hypertension, + CAD, + Past MI, + Cardiac Stents, + Peripheral Vascular Disease, +CHF and + DOE  + dysrhythmias Atrial Fibrillation + Valvular Problems/Murmurs MR  Rhythm:Irregular Rate:Normal + Systolic murmurs TTE 24/8250 - Ejection fraction was in the range of 60% to 65%. Indeterminant diastolic function (atrial fibrillation). - Ventricular septum: D-shaped interventricular septum suggesting  RV pressure/volume overload. - Mitral valve: There was mild mitral valve prolapse with an eccentric, posteriorly-directed mitral regurgitation jet. The MR was at least moderate but was not fully  visualized. - Left atrium: The atrium was moderately to severely dilated. - Right ventricle: The cavity size was moderately dilated. Systolic  function was mildly reduced. - Right atrium: The atrium was severely dilated. - Tricuspid valve: There was moderate regurgitation. Peak RV-RA  gradient (S): 44 mm Hg. - Pulmonary arteries: PA peak pressure: 59 mm Hg (S). - Systemic veins: IVC measured 2.6 cm with < 50% respirophasic  variation, suggesting RA pressure 15 mmHg.   Neuro/Psych  Neuromuscular disease    GI/Hepatic GERD  Controlled,  Endo/Other  Morbid obesity  Renal/GU CRFRenal disease     Musculoskeletal  (+) Arthritis ,   Abdominal (+) + obese,   Peds  Hematology  (+) anemia ,   Anesthesia Other Findings   Reproductive/Obstetrics                              Anesthesia Physical  Anesthesia Plan  ASA: IV  Anesthesia Plan: General   Post-op Pain Management:    Induction: Intravenous  PONV Risk Score and Plan: 3 and Ondansetron and Treatment may vary due to age or medical condition  Airway Management Planned: LMA  Additional Equipment: None  Intra-op Plan:   Post-operative Plan: Extubation in OR  Informed Consent:   Dental advisory given  Plan Discussed with: CRNA  Anesthesia Plan Comments:         Anesthesia Quick Evaluation

## 2017-05-28 NOTE — Transfer of Care (Signed)
Immediate Anesthesia Transfer of Care Note  Patient: [place] L Wyche  Procedure(s) Performed: REPEAT IRRIGATION AND DEBRIDEMENT LEFT THIGH AND LEFT LEG (Left Leg Lower)  Patient Location: PACU  Anesthesia Type:General  Level of Consciousness: awake, alert , oriented and sedated  Airway & Oxygen Therapy: Patient Spontanous Breathing and Patient connected to nasal cannula oxygen  Post-op Assessment: Report given to RN, Post -op Vital signs reviewed and stable and Patient moving all extremities  Post vital signs: Reviewed and stable  Last Vitals:  Vitals:   05/31/2017 0500 05/31/2017 0829  BP: (!) 115/52   Pulse: 64 85  Resp: 16 20  Temp: 37.4 C   SpO2: 99% 99%    Last Pain:  Vitals:   06/12/2017 0500  TempSrc: Oral  PainSc:       Patients Stated Pain Goal: 0 (18/29/93 7169)  Complications: No apparent anesthesia complications

## 2017-05-28 NOTE — Anesthesia Postprocedure Evaluation (Signed)
Anesthesia Post Note  Patient: [place] L Humphries  Procedure(s) Performed: REPEAT IRRIGATION AND DEBRIDEMENT LEFT THIGH AND LEFT LEG (Left Leg Lower)     Patient location during evaluation: PACU Anesthesia Type: General Level of consciousness: awake and alert Pain management: pain level controlled Vital Signs Assessment: post-procedure vital signs reviewed and stable Respiratory status: spontaneous breathing, nonlabored ventilation and respiratory function stable Cardiovascular status: blood pressure returned to baseline and stable Postop Assessment: no apparent nausea or vomiting Anesthetic complications: no    Last Vitals:  Vitals:   05/21/2017 1134 06/04/2017 1148  BP: 127/79 105/62  Pulse: 96 (!) 103  Resp: (!) 23 15  Temp:  (!) 36.1 C  SpO2: 98% 99%    Last Pain:  Vitals:   05/24/2017 1148  TempSrc:   PainSc: Asleep                 Lynda Rainwater

## 2017-05-28 NOTE — Op Note (Signed)
06/18/2017 - 06/04/2017  10:53 AM  PATIENT:  [name]    PRE-OPERATIVE DIAGNOSIS:  Infected Left Leg  POST-OPERATIVE DIAGNOSIS:  Same  PROCEDURE:  REPEAT IRRIGATION AND DEBRIDEMENT LEFT THIGH AND LEFT LEG Excision of skin soft tissue muscle and fascia sharply with a 21 blade knife Ronjair and Cobb elevator  SURGEON:  [name], MD  PHYSICIAN ASSISTANT:None ANESTHESIA:   General  PREOPERATIVE INDICATIONS:  [name] is a  82 y.o. female with a diagnosis of Infected Left Leg who failed conservative measures and elected for surgical management.    The risks benefits and alternatives were discussed with the patient preoperatively including but not limited to the risks of infection, bleeding, nerve injury, cardiopulmonary complications, the need for revision surgery, among others, and the patient was willing to proceed.  OPERATIVE IMPLANTS: Instillation wound VAC 3 units  OPERATIVE FINDINGS: Progressive necrotic fascia with green coloration. Fascia and soft tissue sent for cultures.  OPERATIVE PROCEDURE: Patient was brought to the operating room and underwent a general anesthetic. After adequate levels anesthesia were obtained patient's left lower extremity was prepped using Betadine paint and draped into a sterile field a timeout was called. Patient has had progressive necrosis of the fascia and muscle skin and soft tissue around the wound. Using a 21 blade knife Ronjair and a Cobb elevator this was further debrided back. The wound that encompassed the thigh and calf measures 37 cm in length and 32 cm in width. This was irrigated with pulsatile lavage there was good healthy bleeding tissue the muscle was contractile. After irrigation and debridement the wound VAC was applied with the reticulating foam. This was set for 40 mL of irrigation 10 minutes of dwell time in 2 hours of suction. After the wound VAC was applied patient was extubated taken to the PACU in stable  condition.

## 2017-05-28 NOTE — Interval H&P Note (Signed)
History and Physical Interval Note:  06/07/2017 7:15 AM  [name]  has presented today for surgery, with the diagnosis of Infected Left Leg  The various methods of treatment have been discussed with the patient and family. After consideration of risks, benefits and other options for treatment, the patient has consented to  Procedure(s): REPEAT IRRIGATION AND DEBRIDEMENT LEFT THIGH AND LEFT LEG (Left) as a surgical intervention .  The patient's history has been reviewed, patient examined, no change in status, stable for surgery.  I have reviewed the patient's chart and labs.  Questions were answered to the patient's satisfaction.     [name]

## 2017-05-28 NOTE — H&P (View-Only) (Signed)
Patient ID: [name], female   DOB: Jun 09, 1935, 82 y.o.   MRN: [id number] 4 return to the operating room Wednesday and Friday further debridement of the left thigh and calf wound. Plan for repeat debridement and possible skin graft on Friday.

## 2017-05-28 NOTE — Progress Notes (Signed)
PROGRESS NOTE    [name]  VQM:[id number] DOB: 11-06-1934 DOA: 06/01/2017 PCP: Redmond School, MD   Brief Narrative:  82 y.o. WF PMHx MI, Chronic Atrial Fibrillation, Chronic Diastolic CHF, Pulmonary HTN, severe Tricuspid Valve Regurgitation, chronic respiratory failure) on 2.5 L O2 at night) , CKD stage III   recent left traumatic left leg hematoma complicated by hypovolemic shock and acute blood loss anemia  Presenting with acute on chronic diastolic CHF exacerbation as well as hypertension and elevated troponin. Patient noted to have been recently admitted September 22 to September 28 for left leg traumatic hematoma with noted hypovolemic shock and acute blood loss anemia. Patient was noted to have been transferred to a rehabilitation facility after hospitalization. Per report, patient with progressive weakness since being placed within the rehabilitation facility. No reported worsening of leg hematoma though patient has had progressive edema. No reported fevers or chills. No chest pain. No reported orthopnea or PND. Has been compliant with medication regimen. No reported high salt intake.  ED Course: Presented to the ER afebrile with noted systolic blood pressures in 90s to 100s. Creatinine 1.14, potassium 5.3. Troponin of 0.06. BNP is 748. Chest x-ray with cardiomegaly and central vascular congestion mild hazy bibasilar basilar atelectasis versus infiltrates.  States she was walking with cane in the house grabbed hold of a doorknob as she was moving to another room and believes she tripped. Unsure of what she struck when she fell negative LOC. States uses a cane and a Rollator at home for ambulation.  Subjective: 10/10 A/O 4, negative CP, positive chronic SOB, negative abdominal pain, negative N/V, positive LLE pain but decreased from last week       Assessment & Plan:   Active Problems:   Acute on chronic diastolic CHF (congestive heart failure) (HCC)   Hypotension  Elevated troponin   Atherosclerosis of native arteries of extremities with gangrene, left leg (HCC)   Traumatic hematoma of left lower leg  Acute on Chronic Diastolic CHF -Intermittent episodes hypotension -Per EMR notes Cardiology/possible Pulmonary consult upon arrival to [facility]. No notes in chart from cardiology/pulmonology services. After further review of chart believe this may have been referring to patient's previous admission sits she was only discharged for 3 days -Borderline hypotensive continue to hold home BP medications. ( furosemide, Aldactone, losartan, and Letairis which were held at patient's last discharge 9/28)  -Strict in and out since admission -642ml -Daily weight Filed Weights   05/22/17 0500 05/26/17 2200 06/18/2017 0500  Weight: 226 lb (102.5 kg) 234 lb (106.1 kg) 221 lb (100.2 kg)  -Echocardiogram: Consistent with diastolic CHF/pulmonary hypertension/cardiomyopathy cc results below -Transfusion hemoglobin<8 -10/3 transfuse 1 unit PRBC -10/8 transfuse 2 units PRBC??  Pulmonary HTN -April 2017 RIGHT heart catheterization PAH; 59/17 mmHg -See CHF  Paroxysmal Atrial fibrillation,(CHADS-VASC 5)  -Rate controlled: NSR  -Elliquis  and all anticoagulants held secondary to hematoma     CAD, Elevated Troponin -Stable  -Hold Lipitor secondary to elevated liver enzymes  Hypotension -Borderline hypotension  -10/10 increase Midodrine 10 mg TID -MAP goal> 65       LEFT LEG Hematoma -Hold Eliquis since last admission: NOTE Eliquis reversed on 05/11/2017 -Patient with large hematoma several areas of skin breakdown with eschar formation see picture above. -Foot warm, palpable DP/P -10/10 continue care per Dr. Meridee Score Orthopedic surgery. She will most likely need additional debridement and then Reconstruction/skin graft   Acute on chronic respiratory failure with hypoxia -Resolved -Currently on  2 L  via [place] (patient's home dose 2.5 L) -titrate to maintain  SPO2 > 93%   Leukocytopenia -Resolved   Anemia (baselineHgB~9-10)/Acute Blood loss Anemia -patient received 1 unit PRBC on 10/3    CKD stage III(baseline Cr 0.8-1.1)   Recent Labs Lab 05/22/17 0434 06/16/2017 0158 05/24/17 0644 05/26/17 0321 05/27/17 0250 06/18/2017 0452  CREATININE 0.92 0.91 0.86 1.17* 1.05* 0.66  -at baseline  Hypomagnesemia -Magnesium goal> 2  Hypokalemia -potassium goal>4       DVT prophylaxis: None secondary to large hematoma Code Status: Full Family Communication: Husband and daughter bedside for discussion of plan of care Disposition Plan: TBD   Consultants:  Dr. Jean Rosenthal Orthopedic surgery previous admission 9/22-9/28  Dr. Meridee Score orthopedic surgery    Procedures/Significant Events:  10/3 Echocardiogram: LVEF= 60% to 65%.- Indeterminant diastolic function (atrial fibrillation).  - Ventricular septum: D-shaped interventricular septum suggesting  RV pressure/volume overload. - Aortic valve: There was no stenosis. - Mitral valve: MR was at least moderate but was not fully visualized. - Left atrium: moderately to severely dilated. - Right ventricle:  moderately dilated. - Right atrium:  severely dilated. - Tricuspid valve:  moderate regurgitation.  - Pulmonary arteries: PA peak pressure: 59 mm Hg (S). - Systemic veins: IVC measured 2.6 cm with < 50% respirophasic variation, suggesting RA pressure 15 mmHg. 10/5 LEFT leg I&D:Excision skin soft tissue fat and fascia of an area 39 x 20 cm medial aspect left thigh and left leg 5 cm deep wound VAC placed 10/10 LEFT leg repeat I&D:Using a 21 blade knife Ronjair and a Cobb elevator this was further debrided back. The wound that encompassed the thigh and calf measures 37 cm in length and 32 cm in width. This was irrigated with pulsatile lavage there was good healthy bleeding tissue the muscle was contractile. After irrigation and debridement the wound VAC was applied with the reticulating  foam     I have personally reviewed and interpreted all radiology studies and my findings are as above.  VENTILATOR SETTINGS:    Cultures None  Antimicrobials: Anti-infectives    Start     Dose/Rate Route Frequency Ordered Stop   06/02/2017 0945  ceFAZolin (ANCEF) IVPB 2g/100 mL premix     2 g 200 mL/hr over 30 Minutes Intravenous To Surgery 05/27/17 2001 05/29/17 0945   06/13/2017 2000  ceFAZolin (ANCEF) IVPB 2g/100 mL premix     2 g 200 mL/hr over 30 Minutes Intravenous Every 6 hours 06/06/2017 1643 05/24/17 1009   05/20/2017 1015  ceFAZolin (ANCEF) IVPB 2g/100 mL premix     2 g 200 mL/hr over 30 Minutes Intravenous On call to O.R. 05/25/2017 1014 06/13/2017 1400       Devices    LINES / TUBES:      Continuous Infusions: . sodium chloride    .  ceFAZolin (ANCEF) IV    . methocarbamol (ROBAXIN)  IV       Objective: Vitals:   05/27/17 0400 05/27/17 0748 05/27/17 2036 06/18/2017 0500  BP: (!) 112/58 (!) 113/48 130/60 (!) 115/52  Pulse: 84 76 94 64  Resp: 18 18 (!) 27 16  Temp: 98.2 F (36.8 C)  98.4 F (36.9 C) 99.3 F (37.4 C)  TempSrc: Oral  Oral Oral  SpO2: 91% 90% 99% 99%  Weight:    221 lb (100.2 kg)  Height:        Intake/Output Summary (Last 24 hours) at 05/27/2017 0853 Last data filed at 05/28/17 0000  Gross per 24  hour  Intake             1683 ml  Output             1400 ml  Net              283 ml   Filed Weights   05/22/17 0500 05/26/17 2200 06/05/2017 0500  Weight: 226 lb (102.5 kg) 234 lb (106.1 kg) 221 lb (100.2 kg)    Physical Exam:  General: Sleepy but arousable, A/O 4, positive chronic respiratory distress Neck:  Negative scars, masses, torticollis, lymphadenopathy, JVD Lungs: Clear to auscultation bilaterally without wheezes or crackles Cardiovascular: Regular rate and rhythm without murmur gallop or rub normal S1 and S2 Abdomen: MORBIDLY OBESE, negative abdominal pain, nondistended, positive soft, bowel sounds, no rebound, no ascites, no  appreciable mass Extremities: No significant cyanosis, clubbing. LLE wound VAC in place draining serosanguineous fluid. DP/PT pulse 1+ Psychiatric:  Negative depression, negative anxiety, negative fatigue, negative mania  Central nervous system:  Cranial nerves II through XII intact, tongue/uvula midline, all extremities muscle strength 5/5, sensation intact throughout, negative dysarthria, negative expressive aphasia, negative receptive aphasia..     Data Reviewed: Care during the described time interval was provided by me .  I have reviewed this patient's available data, including medical history, events of note, physical examination, and all test results as part of my evaluation.   CBC:  Recent Labs Lab 05/21/17 0902 05/22/17 0812 05/22/17 1416 05/26/17 0321 05/27/17 0250 06/15/2017 0452  WBC 11.8*  --  10.2 21.3* 18.3* 14.4*  NEUTROABS  --   --  8.1*  --   --   --   HGB 7.3* 7.8* 8.3* 6.6* 9.1* 9.0*  HCT 23.2* 26.0* 26.3* 20.9* 28.8* 30.1*  MCV 94.3  --  93.6 92.9 91.4 93.5  PLT 183  --  204 295 276 606   Basic Metabolic Panel:  Recent Labs Lab 05/22/17 0434 05/24/2017 0158 05/24/17 0644 05/25/17 0404 05/26/17 0321 05/27/17 0250 05/27/2017 0452  NA 138 138 137  --  133* 136 136  K 3.7 3.8 4.7  --  4.8 3.9 4.4  CL 98* 98* 96*  --  92* 94* 93*  CO2 33* 31 31  --  32 34* 36*  GLUCOSE 91 90 131*  --  97 111* 102*  BUN 26* 29* 30*  --  52* 47* 34*  CREATININE 0.92 0.91 0.86  --  1.17* 1.05* 0.66  CALCIUM 8.7* 8.9 9.1  --  8.8* 8.9 8.9  MG 1.5* 1.8 2.1 2.1 2.0  --   --    GFR: Estimated Creatinine Clearance: 63.3 mL/min (by C-G formula based on SCr of 0.66 mg/dL). Liver Function Tests:  Recent Labs Lab 05/27/17 0250  AST 44*  ALT 9*  ALKPHOS 154*  BILITOT 1.1  PROT 5.2*  ALBUMIN 1.8*   No results for input(s): LIPASE, AMYLASE in the last 168 hours. No results for input(s): AMMONIA in the last 168 hours. Coagulation Profile:  Recent Labs Lab 05/22/17 1416  06/09/2017 0638  INR 1.25 1.23   Cardiac Enzymes:  Recent Labs Lab 05/22/17 1912 06/15/2017 0158 06/10/2017 0638  TROPONINI 0.06* 0.06* 0.05*   BNP (last 3 results) No results for input(s): PROBNP in the last 8760 hours. HbA1C: No results for input(s): HGBA1C in the last 72 hours. CBG:  Recent Labs Lab 05/25/17 0100  GLUCAP 126*   Lipid Profile: No results for input(s): CHOL, HDL, LDLCALC, TRIG, CHOLHDL, LDLDIRECT in the  last 72 hours. Thyroid Function Tests: No results for input(s): TSH, T4TOTAL, FREET4, T3FREE, THYROIDAB in the last 72 hours. Anemia Panel: No results for input(s): VITAMINB12, FOLATE, FERRITIN, TIBC, IRON, RETICCTPCT in the last 72 hours. Urine analysis:    Component Value Date/Time   COLORURINE YELLOW 02/10/2016 1503   APPEARANCEUR CLOUDY (A) 02/10/2016 1503   LABSPEC 1.014 02/10/2016 1503   PHURINE 5.5 02/10/2016 1503   GLUCOSEU NEGATIVE 02/10/2016 1503   HGBUR NEGATIVE 02/10/2016 1503   BILIRUBINUR NEGATIVE 02/10/2016 1503   KETONESUR NEGATIVE 02/10/2016 1503   PROTEINUR NEGATIVE 02/10/2016 1503   NITRITE NEGATIVE 02/10/2016 1503   LEUKOCYTESUR NEGATIVE 02/10/2016 1503   Sepsis Labs: @LABRCNTIP (procalcitonin:4,lacticidven:4)  ) Recent Results (from the past 240 hour(s))  MRSA PCR Screening     Status: None   Collection Time: 05/20/17  6:37 PM  Result Value Ref Range Status   MRSA by PCR NEGATIVE NEGATIVE Final    Comment:        The GeneXpert MRSA Assay (FDA approved for NASAL specimens only), is one component of a comprehensive MRSA colonization surveillance program. It is not intended to diagnose MRSA infection nor to guide or monitor treatment for MRSA infections.   Surgical pcr screen     Status: None   Collection Time: 05/22/2017  6:38 AM  Result Value Ref Range Status   MRSA, PCR NEGATIVE NEGATIVE Final   Staphylococcus aureus NEGATIVE NEGATIVE Final    Comment: (NOTE) The Xpert SA Assay (FDA approved for NASAL specimens in  patients 36 years of age and older), is one component of a comprehensive surveillance program. It is not intended to diagnose infection nor to guide or monitor treatment.          Radiology Studies: No results found.      Scheduled Meds: . brimonidine  1 drop Right Eye BID  . brinzolamide  1 drop Both Eyes BID  . docusate sodium  100 mg Oral BID  . Gerhardt's butt cream   Topical BID  . latanoprost  1 drop Right Eye QHS  . midodrine  5 mg Oral TID WC  . sodium chloride flush  3 mL Intravenous Q12H  . timolol  1 drop Both Eyes BID   Continuous Infusions: . sodium chloride    .  ceFAZolin (ANCEF) IV    . methocarbamol (ROBAXIN)  IV       LOS: 9 days    Time spent: 40 minutes    [place] Shannahan, Geraldo Docker, MD [facility] Pager [phone]   If 7PM-7AM, please contact night-coverage [email or web address] Password [email or web address] 05/23/2017, 8:53 AM

## 2017-05-29 ENCOUNTER — Encounter (HOSPITAL_COMMUNITY): Payer: Self-pay | Admitting: Orthopedic Surgery

## 2017-05-29 DIAGNOSIS — T148XXA Other injury of unspecified body region, initial encounter: Secondary | ICD-10-CM

## 2017-05-29 LAB — BASIC METABOLIC PANEL
ANION GAP: 6 (ref 5–15)
BUN: 45 mg/dL — ABNORMAL HIGH (ref 6–20)
CALCIUM: 8.9 mg/dL (ref 8.9–10.3)
CO2: 36 mmol/L — ABNORMAL HIGH (ref 22–32)
CREATININE: 1.21 mg/dL — AB (ref 0.44–1.00)
Chloride: 92 mmol/L — ABNORMAL LOW (ref 101–111)
GFR, EST AFRICAN AMERICAN: 48 mL/min — AB (ref >60)
GFR, EST NON AFRICAN AMERICAN: 41 mL/min — AB (ref >60)
Glucose, Bld: 108 mg/dL — ABNORMAL HIGH (ref 65–99)
Potassium: 5.6 mmol/L — ABNORMAL HIGH (ref 3.5–5.1)
SODIUM: 134 mmol/L — AB (ref 135–145)

## 2017-05-29 LAB — CBC
HCT: 25.5 % — ABNORMAL LOW (ref 36.0–46.0)
Hemoglobin: 7.8 g/dL — ABNORMAL LOW (ref 12.0–15.0)
MCH: 28.8 pg (ref 26.0–34.0)
MCHC: 30.6 g/dL (ref 30.0–36.0)
MCV: 94.1 fL (ref 78.0–100.0)
Platelets: 320 10*3/uL (ref 150–400)
RBC: 2.71 MIL/uL — AB (ref 3.87–5.11)
RDW: 14.9 % (ref 11.5–15.5)
WBC: 32.2 10*3/uL — AB (ref 4.0–10.5)

## 2017-05-29 LAB — PREPARE RBC (CROSSMATCH)

## 2017-05-29 LAB — MAGNESIUM: Magnesium: 2.1 mg/dL (ref 1.7–2.4)

## 2017-05-29 MED ORDER — ACETAMINOPHEN 325 MG PO TABS
650.0000 mg | ORAL_TABLET | Freq: Four times a day (QID) | ORAL | Status: DC | PRN
  Administered 2017-05-29: 650 mg via ORAL
  Filled 2017-05-29: qty 2

## 2017-05-29 MED ORDER — SODIUM CHLORIDE 0.9 % IV SOLN: Freq: Once | INTRAVENOUS | Status: DC

## 2017-05-29 MED ORDER — ACETAMINOPHEN 650 MG RE SUPP: 650.0000 mg | Freq: Four times a day (QID) | RECTAL | Status: DC | PRN

## 2017-05-29 NOTE — Progress Notes (Signed)
PROGRESS NOTE    [name]  WCB:[id number] DOB: 1934/10/24 DOA: 06/07/2017 PCP: Redmond School, MD   Brief Narrative:  82 y.o. WF PMHx MI, Chronic Atrial Fibrillation, Chronic Diastolic CHF, Pulmonary HTN, severe Tricuspid Valve Regurgitation, chronic respiratory failure) on 2.5 L O2 at night) , CKD stage III   recent left traumatic left leg hematoma complicated by hypovolemic shock and acute blood loss anemia  Presenting with acute on chronic diastolic CHF exacerbation as well as hypertension and elevated troponin. Patient noted to have been recently admitted September 22 to September 28 for left leg traumatic hematoma with noted hypovolemic shock and acute blood loss anemia. Patient was noted to have been transferred to a rehabilitation facility after hospitalization. Per report, patient with progressive weakness since being placed within the rehabilitation facility. No reported worsening of leg hematoma though patient has had progressive edema. No reported fevers or chills. No chest pain. No reported orthopnea or PND. Has been compliant with medication regimen. No reported high salt intake.  ED Course: Presented to the ER afebrile with noted systolic blood pressures in 90s to 100s. Creatinine 1.14, potassium 5.3. Troponin of 0.06. BNP is 748. Chest x-ray with cardiomegaly and central vascular congestion mild hazy bibasilar basilar atelectasis versus infiltrates.  States she was walking with cane in the house grabbed hold of a doorknob as she was moving to another room and believes she tripped. Unsure of what she struck when she fell negative LOC. States uses a cane and a Rollator at home for ambulation.  Subjective: 10/11 A/O 4, negative CP, positive chronic SOB, negative abdominal pain, negative N/V, negative LLE    Assessment & Plan:   Active Problems:   Acute on chronic diastolic CHF (congestive heart failure) (HCC)   Hypotension   Elevated troponin   Atherosclerosis of  native arteries of extremities with gangrene, left leg (HCC)   Traumatic hematoma of left lower leg   Necrotizing fasciitis of lower leg (HCC)  Acute on Chronic Diastolic CHF -Intermittent episodes hypotension -Per EMR notes Cardiology/possible Pulmonary consult upon arrival to [facility]. No notes in chart from cardiology/pulmonology services. After further review of chart believe this may have been referring to patient's previous admission sits she was only discharged for 3 days -Borderline hypotensive continue to hold home BP medications. ( furosemide, Aldactone, losartan, and Letairis which were held at patient's last discharge 9/28)  -Strict in and out since admission -1.3 L -Daily weight Filed Weights   06/15/2017 0936 05/29/17 0323 05/29/17 0408  Weight: 221 lb (100.2 kg) 229 lb (103.9 kg) 230 lb (104.3 kg)  -Echocardiogram: Consistent with diastolic CHF/pulmonary hypertension/cardiomyopathy cc results below -Transfusion hemoglobin<8 -10/3 transfuse 1 unit PRBC -10/8 transfuse 2 units PRBC -10/11 transfused 1 unit PRBC  Pulmonary HTN -April 2017 RIGHT heart catheterization PAH; 59/17 mmHg -See CHF  Paroxysmal Atrial fibrillation,(CHADS-VASC 5)  -Rate controlled: NSR  -Elliquis  and all anticoagulants held secondary to hematoma     CAD, Elevated Troponin -Stable  -Hold Lipitor secondary to elevated liver enzymes  Hypotension -Borderline hypotension  -10/10 increase Midodrine 10 mg TID -MAP goal> 65       LEFT LEG Hematoma -Hold Eliquis since last admission: NOTE Eliquis reversed on 05/11/2017 -Patient with large hematoma several areas of skin breakdown with eschar formation see picture above. -Foot warm, palpable DP/P -10/11 continue care per Dr. Meridee Score Orthopedic surgery. Patient scheduled to go to or on 10/12 for further debridement. At some point in future will require reconstruction/skin  graft   Acute on chronic respiratory failure with  hypoxia -Resolved -Currently on  2 L via [place] (patient's home dose 2.5 L) -titrate to maintain SPO2 > 93%   Leukocytopenia -Resolved   Anemia (baselineHgB~9-10)/Acute Blood loss Anemia -See CHF  Recent Labs Lab 05/26/17 0321 05/27/17 0250 06/04/2017 0452 05/29/17 0510  HGB 6.6* 9.1* 9.0* 7.8*      CKD stage III(baseline Cr 0.8-1.1)    Recent Labs Lab 05/31/2017 0158 05/24/17 0644 05/26/17 0321 05/27/17 0250 06/10/2017 0452 05/29/17 0510  CREATININE 0.91 0.86 1.17* 1.05* 0.66 1.21*  -at baseline  Hypomagnesemia -Magnesium goal> 2  Hypokalemia -potassium goal>4  Hyperkalemia -10/11 DC lactated Ringer's       DVT prophylaxis: None secondary to large hematoma Code Status: Full Family Communication: Husband and daughter bedside for discussion of plan of care Disposition Plan: TBD   Consultants:  Dr. Jean Rosenthal Orthopedic surgery previous admission 9/22-9/28  Dr. Meridee Score orthopedic surgery     Procedures/Significant Events:  10/3 Echocardiogram: LVEF= 60% to 65%.- Indeterminant diastolic function (atrial fibrillation).  - Ventricular septum: D-shaped interventricular septum suggesting  RV pressure/volume overload. - Aortic valve: There was no stenosis. - Mitral valve: MR was at least moderate but was not fully visualized. - Left atrium: moderately to severely dilated. - Right ventricle:  moderately dilated. - Right atrium:  severely dilated. - Tricuspid valve:  moderate regurgitation.  - Pulmonary arteries: PA peak pressure: 59 mm Hg (S). - Systemic veins: IVC measured 2.6 cm with < 50% respirophasic variation, suggesting RA pressure 15 mmHg. 10/3 transfuse 1 unit PRBC 10/5 LEFT leg I&D:Excision skin soft tissue fat and fascia of an area 39 x 20 cm medial aspect left thigh and left leg 5 cm deep wound VAC placed 10/10 LEFT leg repeat I&D:Using a 21 blade knife Ronjair and a Cobb elevator this was further debrided back. The wound that encompassed  the thigh and calf measures 37 cm in length and 32 cm in width. This was irrigated with pulsatile lavage there was good healthy bleeding tissue the muscle was contractile. After irrigation and debridement the wound VAC was applied with the reticulating foam 10/8 transfuse 2 units PRBC 10/11 transfused 1 unit PRBC   I have personally reviewed and interpreted all radiology studies and my findings are as above.  VENTILATOR SETTINGS:    Cultures None  Antimicrobials: Anti-infectives    Start     Dose/Rate Route Frequency Ordered Stop   06/08/2017 1800  ceFAZolin (ANCEF) IVPB 2g/100 mL premix     2 g 200 mL/hr over 30 Minutes Intravenous Every 8 hours 06/13/2017 1220 06/02/17 1759   06/16/2017 0945  ceFAZolin (ANCEF) IVPB 2g/100 mL premix     2 g 200 mL/hr over 30 Minutes Intravenous To Surgery 05/27/17 2001 06/07/2017 1015   06/16/2017 2000  ceFAZolin (ANCEF) IVPB 2g/100 mL premix     2 g 200 mL/hr over 30 Minutes Intravenous Every 6 hours 06/03/2017 1643 05/24/17 1009   05/30/2017 1015  ceFAZolin (ANCEF) IVPB 2g/100 mL premix     2 g 200 mL/hr over 30 Minutes Intravenous On call to O.R. 05/26/2017 1014 05/22/2017 1400       Devices    LINES / TUBES:      Continuous Infusions: . sodium chloride    . sodium chloride 10 mL/hr at 05/23/2017 1637  .  ceFAZolin (ANCEF) IV Stopped (05/29/17 1805)  . lactated ringers 10 mL/hr at 06/11/2017 1637  . methocarbamol (ROBAXIN)  IV  Objective: Vitals:   05/29/17 0408 05/29/17 0813 05/29/17 1202 05/29/17 1529  BP:  (!) 105/45 (!) 98/58 (!) 96/44  Pulse:      Resp:  16 (!) 21 15  Temp:  (!) 97.5 F (36.4 C) 98.6 F (37 C) (!) 97.3 F (36.3 C)  TempSrc:  Oral Oral Oral  SpO2:  94% 96% 97%  Weight: 230 lb (104.3 kg)     Height:        Intake/Output Summary (Last 24 hours) at 05/29/17 1825 Last data filed at 05/29/17 1645  Gross per 24 hour  Intake           543.83 ml  Output             1200 ml  Net          -656.17 ml   Filed  Weights   06/13/2017 0936 05/29/17 0323 05/29/17 0408  Weight: 221 lb (100.2 kg) 229 lb (103.9 kg) 230 lb (104.3 kg)    General:  A/O 4, positive chronic respiratory distress Neck:  Negative scars, masses, torticollis, lymphadenopathy, JVD Lungs: Clear to auscultation bilaterally without wheezes or crackles Cardiovascular: Regular rate and rhythm without murmur gallop or rub normal S1 and S2 Abdomen: MORBIDLY OBESE, negative abdominal pain, nondistended, positive soft, bowel sounds, no rebound, no ascites, no appreciable mass Extremities: No significant cyanosis, clubbing. LLE wound VAC in place draining serosanguineous fluid. DP/PT pulse 1+ Psychiatric:  Negative depression, negative anxiety, negative fatigue, negative mania  Central nervous system:  Cranial nerves II through XII intact, tongue/uvula midline, all extremities muscle strength 5/5, sensation intact throughout, negative dysarthria, negative expressive aphasia, negative receptive aphasia..      Data Reviewed: Care during the described time interval was provided by me .  I have reviewed this patient's available data, including medical history, events of note, physical examination, and all test results as part of my evaluation.   CBC:  Recent Labs Lab 05/26/17 0321 05/27/17 0250 06/10/2017 0452 05/29/17 0510  WBC 21.3* 18.3* 14.4* 32.2*  HGB 6.6* 9.1* 9.0* 7.8*  HCT 20.9* 28.8* 30.1* 25.5*  MCV 92.9 91.4 93.5 94.1  PLT 295 276 284 865   Basic Metabolic Panel:  Recent Labs Lab 06/11/2017 0158 05/24/17 0644 05/25/17 0404 05/26/17 0321 05/27/17 0250 05/20/2017 0452 05/29/17 0510  NA 138 137  --  133* 136 136 134*  K 3.8 4.7  --  4.8 3.9 4.4 5.6*  CL 98* 96*  --  92* 94* 93* 92*  CO2 31 31  --  32 34* 36* 36*  GLUCOSE 90 131*  --  97 111* 102* 108*  BUN 29* 30*  --  52* 47* 34* 45*  CREATININE 0.91 0.86  --  1.17* 1.05* 0.66 1.21*  CALCIUM 8.9 9.1  --  8.8* 8.9 8.9 8.9  MG 1.8 2.1 2.1 2.0  --   --  2.1    GFR: Estimated Creatinine Clearance: 42.9 mL/min (A) (by C-G formula based on SCr of 1.21 mg/dL (H)). Liver Function Tests:  Recent Labs Lab 05/27/17 0250  AST 44*  ALT 9*  ALKPHOS 154*  BILITOT 1.1  PROT 5.2*  ALBUMIN 1.8*   No results for input(s): LIPASE, AMYLASE in the last 168 hours. No results for input(s): AMMONIA in the last 168 hours. Coagulation Profile:  Recent Labs Lab 06/14/2017 0638  INR 1.23   Cardiac Enzymes:  Recent Labs Lab 05/22/17 1912 05/25/2017 0158 05/28/2017 0638  TROPONINI 0.06* 0.06* 0.05*   BNP (  last 3 results) No results for input(s): PROBNP in the last 8760 hours. HbA1C: No results for input(s): HGBA1C in the last 72 hours. CBG:  Recent Labs Lab 05/25/17 0100  GLUCAP 126*   Lipid Profile: No results for input(s): CHOL, HDL, LDLCALC, TRIG, CHOLHDL, LDLDIRECT in the last 72 hours. Thyroid Function Tests: No results for input(s): TSH, T4TOTAL, FREET4, T3FREE, THYROIDAB in the last 72 hours. Anemia Panel: No results for input(s): VITAMINB12, FOLATE, FERRITIN, TIBC, IRON, RETICCTPCT in the last 72 hours. Urine analysis:    Component Value Date/Time   COLORURINE YELLOW 02/10/2016 1503   APPEARANCEUR CLOUDY (A) 02/10/2016 1503   LABSPEC 1.014 02/10/2016 1503   PHURINE 5.5 02/10/2016 1503   GLUCOSEU NEGATIVE 02/10/2016 1503   HGBUR NEGATIVE 02/10/2016 1503   BILIRUBINUR NEGATIVE 02/10/2016 1503   KETONESUR NEGATIVE 02/10/2016 1503   PROTEINUR NEGATIVE 02/10/2016 1503   NITRITE NEGATIVE 02/10/2016 1503   LEUKOCYTESUR NEGATIVE 02/10/2016 1503   Sepsis Labs: @LABRCNTIP (procalcitonin:4,lacticidven:4)  ) Recent Results (from the past 240 hour(s))  MRSA PCR Screening     Status: None   Collection Time: 05/20/17  6:37 PM  Result Value Ref Range Status   MRSA by PCR NEGATIVE NEGATIVE Final    Comment:        The GeneXpert MRSA Assay (FDA approved for NASAL specimens only), is one component of a comprehensive MRSA  colonization surveillance program. It is not intended to diagnose MRSA infection nor to guide or monitor treatment for MRSA infections.   Surgical pcr screen     Status: None   Collection Time: 05/25/2017  6:38 AM  Result Value Ref Range Status   MRSA, PCR NEGATIVE NEGATIVE Final   Staphylococcus aureus NEGATIVE NEGATIVE Final    Comment: (NOTE) The Xpert SA Assay (FDA approved for NASAL specimens in patients 51 years of age and older), is one component of a comprehensive surveillance program. It is not intended to diagnose infection nor to guide or monitor treatment.   Aerobic/Anaerobic Culture (surgical/deep wound)     Status: None (Preliminary result)   Collection Time: 06/07/2017 10:26 AM  Result Value Ref Range Status   Specimen Description TISSUE ABSCESS LEFT LEG  Final   Special Requests PATIENT ON FOLLOWING ANCEF 2G  Final   Gram Stain   Final    MODERATE WBC PRESENT, PREDOMINANTLY PMN ABUNDANT GRAM NEGATIVE RODS MODERATE GRAM POSITIVE COCCI    Culture CULTURE REINCUBATED FOR BETTER GROWTH  Final   Report Status PENDING  Incomplete         Radiology Studies: No results found.      Scheduled Meds: . brimonidine  1 drop Right Eye BID  . brinzolamide  1 drop Both Eyes BID  . docusate sodium  100 mg Oral BID  . docusate sodium  100 mg Oral BID  . Gerhardt's butt cream   Topical BID  . latanoprost  1 drop Right Eye QHS  . midodrine  10 mg Oral TID WC  . sodium chloride flush  3 mL Intravenous Q12H  . timolol  1 drop Both Eyes BID   Continuous Infusions: . sodium chloride    . sodium chloride 10 mL/hr at 06/02/2017 1637  .  ceFAZolin (ANCEF) IV Stopped (05/29/17 1805)  . lactated ringers 10 mL/hr at 05/27/2017 1637  . methocarbamol (ROBAXIN)  IV       LOS: 10 days    Time spent: 40 minutes    Elber Galyean, Geraldo Docker, MD [facility] Pager [phone]  If 7PM-7AM, please contact night-coverage [email or web address] Password [facility] 05/29/2017, 6:25 PM

## 2017-05-29 NOTE — Progress Notes (Signed)
CSW met with patient at bedside this morning and gave list of updated bed offers. CSW called patient's spouse this afternoon and discussed bed offers; spouse had come by hospital and reviewed list. Patient declined [facility] when CSW met at bedside, however husband hoping for placement near their home and prefers [facility] (unless [facility] can offer bed, but they have declined) so he and other family can visit her. Spouse hopeful patient can be convinced to choose [facility]. CSW to re-check with [facility] as well.   CSW discussed [facility]'s concern that patient may need longer than 17 days (17 days left covered by Medicare) and that patient and family would be responsible for co-pay if she requires care longer than that. Spouse acknowledged information.   CSW aware of plan for procedure 10/12. CSW to follow and support with discharge when medically ready.  Estanislado Emms, [place]

## 2017-05-29 NOTE — Progress Notes (Signed)
Physical Therapy Treatment Patient Details Name: [name] MRN: [id number] DOB: Feb 02, 1935 Today's Date: 05/29/2017    History of Present Illness 82 yo female admitted 06/03/2017 from [facility] with hypotension, CHF exac, elevated troponin.  Recent hosp admit after fall with injury to LLE.     PMH:  Afib, CHF, CKD, HTN, arthritis, CAD, MI, obesity. S/P excisional debridement of massive necrotic soft tissue medial left thigh and left calf with wound VAC placement.    PT Comments    Patient progressing slowly towards PT goals. Returning to OR tomorrow for another I&D of LLE due to necrotizing fascitis. Tolerated sitting EOB today with assist of 2 and worked on facilitating thoracic extension and trunk rotation as well as pec stretch and reaching activities outside [name]. Pt continues to have lots of pain in LLE worsened with any touch or mobility. Will continue to follow.   Follow Up Recommendations  SNF     Equipment Recommendations  None recommended by PT    Recommendations for Other Services       Precautions / Restrictions Precautions Precautions: Fall Precaution Comments: wound vac Restrictions Weight Bearing Restrictions: No    Mobility  Bed Mobility Overal bed mobility: Needs Assistance       Supine to sit: Max assist;+2 for physical assistance Sit to supine: Max assist;+2 for physical assistance   General bed mobility comments: assist for all aspects  Transfers                 General transfer comment: Deferred.  Ambulation/Gait                 Stairs            Wheelchair Mobility    Modified Rankin (Stroke Patients Only)       Balance Overall balance assessment: Needs assistance Sitting-balance support: Feet supported;Bilateral upper extremity supported Sitting balance-Leahy Scale: Poor Sitting balance - Comments: Requires BUE support and external support to maintain sitting balance; Performed thoracic extension and  rotation right/left using sheet with deep breathing. Performed reaching activities to initiate forward flexion.                                     Cognition Arousal/Alertness: Awake/alert Behavior During Therapy: WFL for tasks assessed/performed Overall Cognitive Status: No family/caregiver present to determine baseline cognitive functioning                                 General Comments: Seems WFL for basic mobility tasks but needs to be further assessed.      Exercises General Exercises - Lower Extremity Long Arc Quad: Both;10 reps;Seated    General Comments General comments (skin integrity, edema, etc.): Wound vac intact. Open skin on posterior right back. RN made aware and placed foam.      Pertinent Vitals/Pain Pain Assessment: Faces Faces Pain Scale: Hurts whole lot Pain Location: LLE during mobility Pain Descriptors / Indicators: Guarding;Grimacing;Sore Pain Intervention(s): Monitored during session;Repositioned    Home Living                      Prior Function            PT Goals (current goals can now be found in the care plan section) Progress towards PT goals: Progressing toward goals    Frequency  Min 2X/week      PT Plan Current plan remains appropriate    Co-evaluation              AM-PAC PT "6 Clicks" Daily Activity  Outcome Measure  Difficulty turning over in bed (including adjusting bedclothes, sheets and blankets)?: Unable Difficulty moving from lying on back to sitting on the side of the bed? : Unable Difficulty sitting down on and standing up from a chair with arms (e.g., wheelchair, bedside commode, etc,.)?: Unable Help needed moving to and from a bed to chair (including a wheelchair)?: Total Help needed walking in hospital room?: Total Help needed climbing 3-5 steps with a railing? : Total 6 Click Score: 6    End of Session Equipment Utilized During Treatment: Oxygen Activity  Tolerance: Patient limited by fatigue;Patient limited by pain Patient left: in bed;with call bell/phone within reach;with SCD's reapplied (prevalon boots) Nurse Communication: Mobility status PT Visit Diagnosis: Other abnormalities of gait and mobility (R26.89);Muscle weakness (generalized) (M62.81);History of falling (Z91.81);Pain Pain - Right/Left: Left Pain - part of body: Leg     Time: 1111-1141 PT Time Calculation (min) (ACUTE ONLY): 30 min  Charges:  $Therapeutic Activity: 8-22 mins $Neuromuscular Re-education: 8-22 mins                    G Codes:       Wray Kearns, PT, DPT [phone]     Marguarite Arbour A Kyiesha Millward 05/29/2017, 2:30 PM

## 2017-05-29 NOTE — Care Management Important Message (Signed)
Important Message  Patient Details  Name: [name] MRN: [id number] Date of Birth: 26-Feb-1935   Medicare Important Message Given:  Yes    Nathen May 05/29/2017, 9:51 AM

## 2017-05-29 NOTE — Progress Notes (Signed)
Patient ID: [name], female   DOB: 1935/07/16, 82 y.o.   MRN: [id number] Postoperative day 1 repeat irrigation debridement massive wound left thigh and left leg. Plan for return to the operating room tomorrow. Patient had massive amounts of necrotic fascia tissue. The fascia was excised. There was no crepitation no evidence of advancement of the infection. Again reinforced the importance of protein supplements and good caloric intake.

## 2017-05-30 ENCOUNTER — Encounter (HOSPITAL_COMMUNITY): Admission: EM | Disposition: E | Payer: Self-pay | Source: Home / Self Care | Attending: Internal Medicine

## 2017-05-30 ENCOUNTER — Inpatient Hospital Stay (HOSPITAL_COMMUNITY): Payer: Medicare Other | Admitting: Anesthesiology

## 2017-05-30 ENCOUNTER — Encounter (HOSPITAL_COMMUNITY): Payer: Self-pay | Admitting: Anesthesiology

## 2017-05-30 DIAGNOSIS — L8990 Pressure ulcer of unspecified site, unspecified stage: Secondary | ICD-10-CM | POA: Insufficient documentation

## 2017-05-30 HISTORY — PX: SKIN SPLIT GRAFT: SHX444

## 2017-05-30 LAB — TYPE AND SCREEN
ABO/RH(D): B NEG
Antibody Screen: NEGATIVE
UNIT DIVISION: 0
Unit division: 0
Unit division: 0

## 2017-05-30 LAB — BASIC METABOLIC PANEL
Anion gap: 8 (ref 5–15)
BUN: 51 mg/dL — AB (ref 6–20)
CO2: 34 mmol/L — ABNORMAL HIGH (ref 22–32)
CREATININE: 1.25 mg/dL — AB (ref 0.44–1.00)
Calcium: 8.5 mg/dL — ABNORMAL LOW (ref 8.9–10.3)
Chloride: 88 mmol/L — ABNORMAL LOW (ref 101–111)
GFR calc Af Amer: 46 mL/min — ABNORMAL LOW (ref >60)
GFR, EST NON AFRICAN AMERICAN: 40 mL/min — AB (ref >60)
Glucose, Bld: 90 mg/dL (ref 65–99)
Potassium: 5.4 mmol/L — ABNORMAL HIGH (ref 3.5–5.1)
SODIUM: 130 mmol/L — AB (ref 135–145)

## 2017-05-30 LAB — MAGNESIUM: MAGNESIUM: 2.1 mg/dL (ref 1.7–2.4)

## 2017-05-30 LAB — BPAM RBC
BLOOD PRODUCT EXPIRATION DATE: 201810242359
Blood Product Expiration Date: 201810262359
Blood Product Expiration Date: 201811022359
ISSUE DATE / TIME: 201810081151
ISSUE DATE / TIME: 201810081617
UNIT TYPE AND RH: 1700
UNIT TYPE AND RH: 1700
Unit Type and Rh: 1700

## 2017-05-30 LAB — CBC
HCT: 22.6 % — ABNORMAL LOW (ref 36.0–46.0)
HEMATOCRIT: 21 % — AB (ref 36.0–46.0)
HEMOGLOBIN: 7 g/dL — AB (ref 12.0–15.0)
Hemoglobin: 6.4 g/dL — CL (ref 12.0–15.0)
MCH: 28.1 pg (ref 26.0–34.0)
MCH: 28.1 pg (ref 26.0–34.0)
MCHC: 30.5 g/dL (ref 30.0–36.0)
MCHC: 31 g/dL (ref 30.0–36.0)
MCV: 92.1 fL (ref 78.0–100.0)
MCV: 90.8 fL (ref 78.0–100.0)
Platelets: 312 10*3/uL (ref 150–400)
Platelets: 287 10*3/uL (ref 150–400)
RBC: 2.28 MIL/uL — ABNORMAL LOW (ref 3.87–5.11)
RBC: 2.49 MIL/uL — ABNORMAL LOW (ref 3.87–5.11)
RDW: 14.7 % (ref 11.5–15.5)
RDW: 15.8 % — ABNORMAL HIGH (ref 11.5–15.5)
WBC: 28.9 10*3/uL — ABNORMAL HIGH (ref 4.0–10.5)
WBC: 24.3 10*3/uL — ABNORMAL HIGH (ref 4.0–10.5)

## 2017-05-30 SURGERY — APPLICATION, GRAFT, SKIN, SPLIT-THICKNESS
Anesthesia: General | Laterality: Left

## 2017-05-30 MED ORDER — LACTATED RINGERS IV SOLN
INTRAVENOUS | Status: DC
  Administered 2017-05-30: 09:00:00 via INTRAVENOUS

## 2017-05-30 MED ORDER — ACETAMINOPHEN 650 MG RE SUPP: 650.0000 mg | Freq: Four times a day (QID) | RECTAL | Status: DC | PRN

## 2017-05-30 MED ORDER — POLYETHYLENE GLYCOL 3350 17 G PO PACK
17.0000 g | PACK | Freq: Every day | ORAL | Status: DC | PRN
Start: 2017-05-30 — End: 2017-06-17
  Administered 2017-06-02: 17 g via ORAL
  Filled 2017-05-30 (×2): qty 1

## 2017-05-30 MED ORDER — OXYCODONE HCL 5 MG PO TABS
ORAL_TABLET | ORAL | Status: AC
  Filled 2017-05-30: qty 1

## 2017-05-30 MED ORDER — ONDANSETRON HCL 4 MG/2ML IJ SOLN
INTRAMUSCULAR | Status: AC
  Filled 2017-05-30: qty 2

## 2017-05-30 MED ORDER — PIPERACILLIN-TAZOBACTAM 3.375 G IVPB
3.3750 g | Freq: Three times a day (TID) | INTRAVENOUS | Status: DC
  Administered 2017-05-30 – 2017-06-09 (×28): 3.375 g via INTRAVENOUS
  Filled 2017-05-30 (×31): qty 50

## 2017-05-30 MED ORDER — DOCUSATE SODIUM 100 MG PO CAPS
100.0000 mg | ORAL_CAPSULE | Freq: Two times a day (BID) | ORAL | Status: DC
  Administered 2017-05-30 – 2017-06-11 (×11): 100 mg via ORAL
  Filled 2017-05-30 (×26): qty 1

## 2017-05-30 MED ORDER — SODIUM CHLORIDE 0.9 % IR SOLN
Status: DC | PRN
  Administered 2017-05-30: 3000 mL

## 2017-05-30 MED ORDER — METHOCARBAMOL 1000 MG/10ML IJ SOLN
500.0000 mg | Freq: Four times a day (QID) | INTRAVENOUS | Status: DC | PRN
  Filled 2017-05-30: qty 5

## 2017-05-30 MED ORDER — BISACODYL 10 MG RE SUPP
10.0000 mg | Freq: Every day | RECTAL | Status: DC | PRN
  Administered 2017-06-02: 10 mg via RECTAL
  Filled 2017-05-30: qty 1

## 2017-05-30 MED ORDER — VANCOMYCIN HCL 10 G IV SOLR
1250.0000 mg | INTRAVENOUS | Status: DC
  Administered 2017-05-31 – 2017-06-02 (×3): 1250 mg via INTRAVENOUS
  Filled 2017-05-30 (×4): qty 1250

## 2017-05-30 MED ORDER — MIDAZOLAM HCL 2 MG/2ML IJ SOLN
INTRAMUSCULAR | Status: AC
  Filled 2017-05-30: qty 2

## 2017-05-30 MED ORDER — PROMETHAZINE HCL 25 MG/ML IJ SOLN: 6.2500 mg | INTRAMUSCULAR | Status: DC | PRN

## 2017-05-30 MED ORDER — PROPOFOL 10 MG/ML IV BOLUS
INTRAVENOUS | Status: DC | PRN
  Administered 2017-05-30: 20 mg via INTRAVENOUS
  Administered 2017-05-30: 150 mg via INTRAVENOUS

## 2017-05-30 MED ORDER — PHENYLEPHRINE HCL 10 MG/ML IJ SOLN
INTRAMUSCULAR | Status: DC | PRN
  Administered 2017-05-30 (×2): 120 ug via INTRAVENOUS

## 2017-05-30 MED ORDER — PROPOFOL 10 MG/ML IV BOLUS
INTRAVENOUS | Status: AC
  Filled 2017-05-30: qty 20

## 2017-05-30 MED ORDER — FENTANYL CITRATE (PF) 250 MCG/5ML IJ SOLN
INTRAMUSCULAR | Status: AC
  Filled 2017-05-30: qty 5

## 2017-05-30 MED ORDER — METOCLOPRAMIDE HCL 5 MG/ML IJ SOLN: 5.0000 mg | Freq: Three times a day (TID) | INTRAMUSCULAR | Status: DC | PRN

## 2017-05-30 MED ORDER — ACETAMINOPHEN 325 MG PO TABS
650.0000 mg | ORAL_TABLET | Freq: Four times a day (QID) | ORAL | Status: DC | PRN
  Administered 2017-05-31: 650 mg via ORAL
  Filled 2017-05-30: qty 2

## 2017-05-30 MED ORDER — ONDANSETRON HCL 4 MG PO TABS: 4.0000 mg | ORAL_TABLET | Freq: Four times a day (QID) | ORAL | Status: DC | PRN

## 2017-05-30 MED ORDER — POVIDONE-IODINE 10 % EX SWAB: 2.0000 "application " | Freq: Once | CUTANEOUS | Status: DC

## 2017-05-30 MED ORDER — PIPERACILLIN-TAZOBACTAM 3.375 G IVPB 30 MIN
3.3750 g | Freq: Once | INTRAVENOUS | Status: AC
  Administered 2017-05-30: 3.375 g via INTRAVENOUS
  Filled 2017-05-30: qty 50

## 2017-05-30 MED ORDER — FENTANYL CITRATE (PF) 100 MCG/2ML IJ SOLN
INTRAMUSCULAR | Status: DC | PRN
  Administered 2017-05-30 (×2): 50 ug via INTRAVENOUS

## 2017-05-30 MED ORDER — OXYCODONE HCL 5 MG/5ML PO SOLN: 5.0000 mg | Freq: Once | ORAL | Status: AC | PRN

## 2017-05-30 MED ORDER — 0.9 % SODIUM CHLORIDE (POUR BTL) OPTIME
TOPICAL | Status: DC | PRN
  Administered 2017-05-30: 1000 mL

## 2017-05-30 MED ORDER — OXYCODONE HCL 5 MG PO TABS
5.0000 mg | ORAL_TABLET | ORAL | Status: DC | PRN
  Administered 2017-05-30 (×2): 10 mg via ORAL
  Administered 2017-05-31 – 2017-06-01 (×2): 5 mg via ORAL
  Administered 2017-06-01: 10 mg via ORAL
  Administered 2017-06-02 – 2017-06-03 (×3): 5 mg via ORAL
  Filled 2017-05-30: qty 1
  Filled 2017-05-30: qty 2
  Filled 2017-05-30 (×3): qty 1
  Filled 2017-05-30: qty 2
  Filled 2017-05-30: qty 1
  Filled 2017-05-30: qty 2

## 2017-05-30 MED ORDER — SODIUM CHLORIDE 0.9 % IV SOLN
2000.0000 mg | Freq: Once | INTRAVENOUS | Status: AC
  Administered 2017-05-30: 2000 mg via INTRAVENOUS
  Filled 2017-05-30: qty 2000

## 2017-05-30 MED ORDER — CEFAZOLIN SODIUM-DEXTROSE 2-4 GM/100ML-% IV SOLN
2.0000 g | INTRAVENOUS | Status: DC
  Filled 2017-05-30: qty 100

## 2017-05-30 MED ORDER — PHENYLEPHRINE HCL 10 MG/ML IJ SOLN
INTRAVENOUS | Status: DC | PRN
  Administered 2017-05-30: 20 ug/min via INTRAVENOUS

## 2017-05-30 MED ORDER — LIDOCAINE HCL (CARDIAC) 20 MG/ML IV SOLN
INTRAVENOUS | Status: DC | PRN
  Administered 2017-05-30: 80 mg via INTRAVENOUS

## 2017-05-30 MED ORDER — CHLORHEXIDINE GLUCONATE 4 % EX LIQD: 60.0000 mL | Freq: Once | CUTANEOUS | Status: DC

## 2017-05-30 MED ORDER — MAGNESIUM CITRATE PO SOLN
1.0000 | Freq: Once | ORAL | Status: AC | PRN
  Administered 2017-06-03: 1 via ORAL
  Filled 2017-05-30: qty 296

## 2017-05-30 MED ORDER — ONDANSETRON HCL 4 MG/2ML IJ SOLN
INTRAMUSCULAR | Status: DC | PRN
Start: 2017-05-30 — End: 2017-05-30
  Administered 2017-05-30: 4 mg via INTRAVENOUS

## 2017-05-30 MED ORDER — ONDANSETRON HCL 4 MG/2ML IJ SOLN
4.0000 mg | Freq: Four times a day (QID) | INTRAMUSCULAR | Status: DC | PRN
  Administered 2017-06-03: 4 mg via INTRAVENOUS
  Filled 2017-05-30: qty 2

## 2017-05-30 MED ORDER — HYDROMORPHONE HCL 1 MG/ML IJ SOLN: 1.0000 mg | INTRAMUSCULAR | Status: DC | PRN

## 2017-05-30 MED ORDER — METOCLOPRAMIDE HCL 5 MG PO TABS: 5.0000 mg | ORAL_TABLET | Freq: Three times a day (TID) | ORAL | Status: DC | PRN

## 2017-05-30 MED ORDER — OXYCODONE HCL 5 MG PO TABS
5.0000 mg | ORAL_TABLET | Freq: Once | ORAL | Status: AC | PRN
  Administered 2017-05-30: 5 mg via ORAL

## 2017-05-30 MED ORDER — HYDROMORPHONE HCL 1 MG/ML IJ SOLN
INTRAMUSCULAR | Status: AC
  Filled 2017-05-30: qty 1

## 2017-05-30 MED ORDER — SODIUM CHLORIDE 0.9 % IV SOLN: INTRAVENOUS | Status: DC

## 2017-05-30 MED ORDER — DEXAMETHASONE SODIUM PHOSPHATE 10 MG/ML IJ SOLN
INTRAMUSCULAR | Status: AC
  Filled 2017-05-30: qty 1

## 2017-05-30 MED ORDER — METHOCARBAMOL 500 MG PO TABS
500.0000 mg | ORAL_TABLET | Freq: Four times a day (QID) | ORAL | Status: DC | PRN
Start: 2017-05-30 — End: 2017-06-17
  Administered 2017-06-01 – 2017-06-02 (×2): 500 mg via ORAL
  Filled 2017-05-30 (×3): qty 1

## 2017-05-30 MED ORDER — HYDROMORPHONE HCL 1 MG/ML IJ SOLN
0.2500 mg | INTRAMUSCULAR | Status: DC | PRN
  Administered 2017-05-30 (×2): 0.5 mg via INTRAVENOUS

## 2017-05-30 MED ORDER — LIDOCAINE 2% (20 MG/ML) 5 ML SYRINGE
INTRAMUSCULAR | Status: AC
  Filled 2017-05-30: qty 5

## 2017-05-30 SURGICAL SUPPLY — 51 items
BNDG CMPR 9X4 STRL LF SNTH (GAUZE/BANDAGES/DRESSINGS) ×1
BNDG COHESIVE 6X5 TAN STRL LF (GAUZE/BANDAGES/DRESSINGS) ×3 IMPLANT
BNDG ESMARK 4X9 LF (GAUZE/BANDAGES/DRESSINGS) ×3 IMPLANT
BNDG GAUZE STRTCH 6 (GAUZE/BANDAGES/DRESSINGS) IMPLANT
CANISTER WOUND CARE 500ML ATS (WOUND CARE) ×3 IMPLANT
CONNECTOR Y ATS VAC SYSTEM (MISCELLANEOUS) ×3 IMPLANT
COVER SURGICAL LIGHT HANDLE (MISCELLANEOUS) ×6 IMPLANT
CUFF TOURNIQUET SINGLE 18IN (TOURNIQUET CUFF) IMPLANT
CUFF TOURNIQUET SINGLE 24IN (TOURNIQUET CUFF) IMPLANT
DERMACARRIERS GRAFT 1 TO 1.5 (DISPOSABLE)
DRAPE INCISE IOBAN 66X45 STRL (DRAPES) ×9 IMPLANT
DRAPE U-SHAPE 47X51 STRL (DRAPES) ×3 IMPLANT
DRESSING VERAFLO CLEANSE CC (GAUZE/BANDAGES/DRESSINGS) ×1 IMPLANT
DRSG ADAPTIC 3X8 NADH LF (GAUZE/BANDAGES/DRESSINGS) IMPLANT
DRSG MEPITEL 4X7.2 (GAUZE/BANDAGES/DRESSINGS) ×3 IMPLANT
DRSG VERAFLO CLEANSE CC (GAUZE/BANDAGES/DRESSINGS) ×3
DURAPREP 26ML APPLICATOR (WOUND CARE) ×3 IMPLANT
ELECT CAUTERY BLADE 6.4 (BLADE) ×3 IMPLANT
ELECT REM PT RETURN 9FT ADLT (ELECTROSURGICAL) ×3
ELECTRODE REM PT RTRN 9FT ADLT (ELECTROSURGICAL) ×1 IMPLANT
GAUZE SPONGE 4X4 12PLY STRL (GAUZE/BANDAGES/DRESSINGS) IMPLANT
GLOVE BIOGEL PI IND STRL 9 (GLOVE) ×1 IMPLANT
GLOVE BIOGEL PI INDICATOR 9 (GLOVE) ×2
GLOVE SURG ORTHO 9.0 STRL STRW (GLOVE) ×3 IMPLANT
GOWN STRL REUS W/ TWL XL LVL3 (GOWN DISPOSABLE) ×2 IMPLANT
GOWN STRL REUS W/TWL XL LVL3 (GOWN DISPOSABLE) ×6
GRAFT DERMACARRIERS 1 TO 1.5 (DISPOSABLE) IMPLANT
IRRIG SUCT STRYKERFLOW 2 WTIP (MISCELLANEOUS) ×3
IRRIGATION SUCT STRKRFLW 2 WTP (MISCELLANEOUS) ×1 IMPLANT
KIT BASIN OR (CUSTOM PROCEDURE TRAY) ×3 IMPLANT
KIT ROOM TURNOVER OR (KITS) ×3 IMPLANT
MANIFOLD NEPTUNE II (INSTRUMENTS) ×3 IMPLANT
NEEDLE HYPO 25GX1X1/2 BEV (NEEDLE) IMPLANT
NS IRRIG 1000ML POUR BTL (IV SOLUTION) ×3 IMPLANT
PACK ORTHO EXTREMITY (CUSTOM PROCEDURE TRAY) ×3 IMPLANT
PAD ARMBOARD 7.5X6 YLW CONV (MISCELLANEOUS) ×6 IMPLANT
PAD CAST 4YDX4 CTTN HI CHSV (CAST SUPPLIES) IMPLANT
PAD NEG PRESSURE SENSATRAC (MISCELLANEOUS) ×6 IMPLANT
PADDING CAST COTTON 4X4 STRL (CAST SUPPLIES)
SPONGE LAP 18X18 X RAY DECT (DISPOSABLE) ×3 IMPLANT
SUCTION FRAZIER HANDLE 10FR (MISCELLANEOUS)
SUCTION TUBE FRAZIER 10FR DISP (MISCELLANEOUS) IMPLANT
SUT ETHILON 4 0 PS 2 18 (SUTURE) IMPLANT
SYR CONTROL 10ML LL (SYRINGE) IMPLANT
TOWEL OR 17X24 6PK STRL BLUE (TOWEL DISPOSABLE) ×3 IMPLANT
TOWEL OR 17X26 10 PK STRL BLUE (TOWEL DISPOSABLE) ×3 IMPLANT
TUBE CONNECTING 12'X1/4 (SUCTIONS)
TUBE CONNECTING 12X1/4 (SUCTIONS) IMPLANT
VAC VERATRAC DUO TUBE SET IMPLANT
WATER STERILE IRR 1000ML POUR (IV SOLUTION) ×3 IMPLANT
YANKAUER SUCT BULB TIP NO VENT (SUCTIONS) ×3 IMPLANT

## 2017-05-30 NOTE — Anesthesia Preprocedure Evaluation (Addendum)
Anesthesia Evaluation  Patient identified by MRN, date of birth, ID band Patient awake    Reviewed: Allergy & Precautions, H&P , NPO status , Patient's Chart, lab work & pertinent test results  History of Anesthesia Complications Negative for: history of anesthetic complications  Airway Mallampati: IV  TM Distance: <3 FB Neck ROM: Full    Dental  (+) Teeth Intact, Dental Advisory Given   Pulmonary shortness of breath and Long-Term Oxygen Therapy, former smoker,     + decreased breath sounds      Cardiovascular hypertension, + CAD, + Past MI, + Cardiac Stents, + Peripheral Vascular Disease, +CHF and + DOE  + dysrhythmias Atrial Fibrillation + Valvular Problems/Murmurs MR  Rhythm:Irregular Rate:Normal + Systolic murmurs TTE 46/2703 - Ejection fraction was in the range of 60% to 65%. Indeterminant diastolic function (atrial fibrillation). - Ventricular septum: D-shaped interventricular septum suggesting  RV pressure/volume overload. - Mitral valve: There was mild mitral valve prolapse with an eccentric, posteriorly-directed mitral regurgitation jet. The MR was at least moderate but was not fully  visualized. - Left atrium: The atrium was moderately to severely dilated. - Right ventricle: The cavity size was moderately dilated. Systolic  function was mildly reduced. - Right atrium: The atrium was severely dilated. - Tricuspid valve: There was moderate regurgitation. Peak RV-RA  gradient (S): 44 mm Hg. - Pulmonary arteries: PA peak pressure: 59 mm Hg (S). - Systemic veins: IVC measured 2.6 cm with < 50% respirophasic  variation, suggesting RA pressure 15 mmHg.   Neuro/Psych  Neuromuscular disease    GI/Hepatic GERD  Controlled,  Endo/Other  Morbid obesity  Renal/GU CRFRenal disease     Musculoskeletal  (+) Arthritis ,   Abdominal (+) + obese,   Peds  Hematology  (+) anemia ,   Anesthesia Other Findings    Reproductive/Obstetrics                            Anesthesia Physical  Anesthesia Plan  ASA: IV  Anesthesia Plan: General   Post-op Pain Management:    Induction: Intravenous  PONV Risk Score and Plan: 3 and Ondansetron and Treatment may vary due to age or medical condition  Airway Management Planned: LMA  Additional Equipment: None  Intra-op Plan:   Post-operative Plan: Extubation in OR  Informed Consent:   Dental advisory given  Plan Discussed with: CRNA  Anesthesia Plan Comments:         Anesthesia Quick Evaluation

## 2017-05-30 NOTE — Progress Notes (Signed)
Pharmacy Antibiotic Note  [name] is a 82 y.o. female admitted on 05/22/2017 s/p mechanical fall with resulting traumatic hematoma on L thigh and leg. She has been going for repeat I&Ds with placement of wound vacs to help infection. She had been receiving Ancef peri-operatively, but this will now be broadened to vancomycin and zosyn. WBC 28.9, afebrile.   Plan: -Vancomycin 2 g IV x1 then 1250 mg IV q24h -Zosyn 3.375 g IV q8h -Monitor renal fx, cultures, check VT at Css   Height: 5\' 3"  (160 cm) Weight: 230 lb (104.3 kg) IBW/kg (Calculated) : 52.4  Temp (24hrs), Avg:98.6 F (37 C), Min:97.3 F (36.3 C), Max:100.3 F (37.9 C)   Recent Labs Lab 05/26/17 0321 05/27/17 0250 05/20/2017 0452 05/29/17 0510 06/11/2017 0249  WBC 21.3* 18.3* 14.4* 32.2* 28.9*  CREATININE 1.17* 1.05* 0.66 1.21* 1.25*     Antimicrobials this admission: 10/12 vancomycin > 10/12 zosyn >  Dose adjustments this admission: N/A  Microbiology results: 10/10 wound culture: ip  10/5 mrsa pcr: neg   Harvel Quale 05/28/2017 1:44 PM

## 2017-05-30 NOTE — Plan of Care (Signed)
Problem: Tissue Perfusion: Goal: Risk factors for ineffective tissue perfusion will decrease Outcome: Not Progressing Hgb 6.4. Pt received 1unit PRBC. Temperature improved 98.8

## 2017-05-30 NOTE — Progress Notes (Signed)
Report received from [place], [place] from [place].

## 2017-05-30 NOTE — Progress Notes (Signed)
Pt temp=100.3. Dr. Ellsworth Lennox was texted paged temp and that pt is to receive 1 unit PRBC.

## 2017-05-30 NOTE — Progress Notes (Signed)
Type and screen expired. Text page Dr. Ellsworth Lennox for new type and screen order.

## 2017-05-30 NOTE — Anesthesia Procedure Notes (Signed)
Procedure Name: LMA Insertion Date/Time: 06/01/2017 9:18 AM Performed by: Luciana Axe K Pre-anesthesia Checklist: Patient identified, Emergency Drugs available, Suction available and Patient being monitored Patient Re-evaluated:Patient Re-evaluated prior to induction Oxygen Delivery Method: Circle System Utilized Preoxygenation: Pre-oxygenation with 100% oxygen Induction Type: IV induction Ventilation: Mask ventilation without difficulty LMA: LMA inserted LMA Size: 4.0 Number of attempts: 1 Airway Equipment and Method: Bite block Placement Confirmation: positive ETCO2 Tube secured with: Tape Dental Injury: Teeth and Oropharynx as per pre-operative assessment

## 2017-05-30 NOTE — Progress Notes (Signed)
[facility] [place]  ZJQ:[id number] DOB: 1934-12-07 DOA: 05/25/2017 PCP: Redmond School, MD    Brief Narrative:  82 y.o. female with a Hx of MI, Chronic Atrial Fibrillation, Chronic Diastolic CHF, Pulmonary HTN, severe Tricuspid Valve Regurgitation, chronic respiratory failure (on 2.5 L O2 at night) , CKD stage III, and a recent traumatic left leg hematoma complicated by hypovolemic shock and acute blood loss anemia requiring admission 05/10/17-05/16/17 (she has undergone I&D and now has a wound VAC and will need a skin graft later in the week.) who was again admitted 06/15/2017 with an acute on chronic diastolic CHF exacerbation associated with hypotension and elevated troponin.   Subjective: The patient is resting comfortably in her room postoperatively.  She states the pain in her leg is currently well-controlled.  She denies chest pain shortness breath fevers chills nausea or vomiting.  Assessment & Plan:  Acute on chronic hypoxic respiratory failure / Acute on Chronic Diastolic CHF in the setting of atrial fibrillation / demand ischemia TTE 05/21/17 noted EF 60-65% - CHF remains well compensated - net negative approximately 1.2 L for this hospital stay - no change in tx plan today - attempt to keep weight at ~104kg Filed Weights   05/29/17 0323 05/29/17 0408 06/08/2017 0813  Weight: 103.9 kg (229 lb) 104.3 kg (230 lb) 104.3 kg (230 lb)    LEFT LEG Hematoma s/p fall on anticoag Eliquis reversed on 05/11/2017. Surgically debrided 05/25/2017. Returned to OR 10/10 and again 10/12 for I&D.  Care per Dr. Sharol Given    Acute blood loss anemia in the setting of traumatic hematoma Given 1 unit of PRBCs on 05/21/17 - Hgb drifted back down, likely due to weeping from wound and poor nutritional status - 2U PRBC given 10/8 - 1U PRBC given 10/12 - recheck CBC this afternoon   Recent Labs Lab 05/26/17 0321 05/27/17 0250 05/26/2017 0452 05/29/17 0510 05/22/2017 0249  HGB 6.6*  9.1* 9.0* 7.8* 6.4*    Pulmonary HTN April 2017 RIGHT heart catheterization PAH 59/17 mmHg - no volume overload    Paroxysmal Atrial fibrillation (CHADS-VASC 5)  Rate controlled - no anticoagulation in setting of large bleeding event  CAD, Elevated Troponin Troponin elevated mildly, but trend flat.  No symptoms to suggest acute coronary syndrome  Hypotension Most consistent with hypovolemia due to acute blood loss - follow  Acute kidney injury on CKD stage III (baseline Cr 0.8-1.1)  Acute increase in creatinine likely prerenal due to acute blood loss - recheck in AM s/p another transfusion today   Recent Labs Lab 05/26/17 0321 05/27/17 0250 05/26/2017 0452 05/29/17 0510 05/31/2017 0249  CREATININE 1.17* 1.05* 0.66 1.21* 1.25*    Obesity Body mass index is 40.03 kg/m     DVT prophylaxis: R LE SCD Code Status: FULL CODE Family Communication: spoke with husband at bedside  Disposition Plan:   Consultants:  Dr. Sharol Given   Antimicrobials:  none  Objective: Blood pressure 101/66, pulse (!) 59, temperature 97.7 F (36.5 C), resp. rate 20, height 5\' 3"  (1.6 m), weight 104.3 kg (230 lb), SpO2 100 %.  Intake/Output Summary (Last 24 hours) at 05/22/2017 1511 Last data filed at 06/11/2017 1020  Gross per 24 hour  Intake              503 ml  Output              850 ml  Net             -  347 ml   Filed Weights   05/29/17 0323 05/29/17 0408 05/22/2017 0813  Weight: 103.9 kg (229 lb) 104.3 kg (230 lb) 104.3 kg (230 lb)    Examination: General: No acute respiratory distress - alert  Lungs: CTA B  Cardiovascular: irregularly irregular - rate controlled - no M Abdomen: NT/ND, soft, bowel sounds positive, no rebound Extremities:no significant right lower extremity edema - left lower extremity in extensive dressing  CBC:  Recent Labs Lab 05/26/17 0321 05/27/17 0250 06/09/2017 0452 05/29/17 0510 06/05/2017 0249  WBC 21.3* 18.3* 14.4* 32.2* 28.9*  HGB 6.6* 9.1* 9.0* 7.8* 6.4*    HCT 20.9* 28.8* 30.1* 25.5* 21.0*  MCV 92.9 91.4 93.5 94.1 92.1  PLT 295 276 284 320 696   Basic Metabolic Panel:  Recent Labs Lab 05/24/17 0644 05/25/17 0404 05/26/17 0321 05/27/17 0250 06/01/2017 0452 05/29/17 0510 05/30/17 0249  NA 137  --  133* 136 136 134* 130*  K 4.7  --  4.8 3.9 4.4 5.6* 5.4*  CL 96*  --  92* 94* 93* 92* 88*  CO2 31  --  32 34* 36* 36* 34*  GLUCOSE 131*  --  97 111* 102* 108* 90  BUN 30*  --  52* 47* 34* 45* 51*  CREATININE 0.86  --  1.17* 1.05* 0.66 1.21* 1.25*  CALCIUM 9.1  --  8.8* 8.9 8.9 8.9 8.5*  MG 2.1 2.1 2.0  --   --  2.1 2.1   GFR: Estimated Creatinine Clearance: 41.5 mL/min (A) (by C-G formula based on SCr of 1.25 mg/dL (H)).  Liver Function Tests:  Recent Labs Lab 05/27/17 0250  AST 44*  ALT 9*  ALKPHOS 154*  BILITOT 1.1  PROT 5.2*  ALBUMIN 1.8*    CBG:  Recent Labs Lab 05/25/17 0100  GLUCAP 126*    Recent Results (from the past 240 hour(s))  MRSA PCR Screening     Status: None   Collection Time: 05/20/17  6:37 PM  Result Value Ref Range Status   MRSA by PCR NEGATIVE NEGATIVE Final    Comment:        The GeneXpert MRSA Assay (FDA approved for NASAL specimens only), is one component of a comprehensive MRSA colonization surveillance program. It is not intended to diagnose MRSA infection nor to guide or monitor treatment for MRSA infections.   Surgical pcr screen     Status: None   Collection Time: 06/05/2017  6:38 AM  Result Value Ref Range Status   MRSA, PCR NEGATIVE NEGATIVE Final   Staphylococcus aureus NEGATIVE NEGATIVE Final    Comment: (NOTE) The Xpert SA Assay (FDA approved for NASAL specimens in patients 34 years of age and older), is one component of a comprehensive surveillance program. It is not intended to diagnose infection nor to guide or monitor treatment.   Aerobic/Anaerobic Culture (surgical/deep wound)     Status: None (Preliminary result)   Collection Time: 06/03/2017 10:26 AM  Result Value  Ref Range Status   Specimen Description TISSUE ABSCESS LEFT LEG  Final   Special Requests PATIENT ON FOLLOWING ANCEF 2G  Final   Gram Stain   Final    MODERATE WBC PRESENT, PREDOMINANTLY PMN ABUNDANT GRAM NEGATIVE RODS MODERATE GRAM POSITIVE COCCI    Culture   Final    ABUNDANT GRAM NEGATIVE RODS CULTURE REINCUBATED FOR BETTER GROWTH HOLDING FOR POSSIBLE ANAEROBE    Report Status PENDING  Incomplete     Scheduled Meds: . brimonidine  1 drop Right Eye BID  .  brinzolamide  1 drop Both Eyes BID  . docusate sodium  100 mg Oral BID  . Gerhardt's butt cream   Topical BID  . HYDROmorphone      . latanoprost  1 drop Right Eye QHS  . midodrine  10 mg Oral TID WC  . oxyCODONE      . sodium chloride flush  3 mL Intravenous Q12H  . timolol  1 drop Both Eyes BID     LOS: 11 days   Cherene Altes, MD [facility] Office  [phone] Pager - Text Page per Shea Evans as per below:  On-Call/Text Page:      [email or web address]      password [email or web address]  If 7PM-7AM, please contact night-coverage [email or web address] Password [email or web address] 06/15/2017, 3:11 PM

## 2017-05-30 NOTE — Op Note (Signed)
06/01/2017 - 05/27/2017  10:06 AM  PATIENT:  [name]    PRE-OPERATIVE DIAGNOSIS:  Infected Left Leg  POST-OPERATIVE DIAGNOSIS:  Same  PROCEDURE:  DEBRIDEMENT LEFT LEG AND THIGH  SURGEON:  [name], MD  PHYSICIAN ASSISTANT:None ANESTHESIA:   General  PREOPERATIVE INDICATIONS:  [name] is a  82 y.o. female with a diagnosis of Infected Left Leg who failed conservative measures and elected for surgical management.    The risks benefits and alternatives were discussed with the patient preoperatively including but not limited to the risks of infection, bleeding, nerve injury, cardiopulmonary complications, the need for revision surgery, among others, and the patient was willing to proceed.  OPERATIVE IMPLANTS: Wound VAC 3  OPERATIVE FINDINGS: Progressive ischemic tissue with progressive necrosis of the fascia progressive nonviable muscle. There is no abscess. Wound cultures positive for gram-positive cocci and gram-negative rods but no sensitivities available yet.  OPERATIVE PROCEDURE: Patient was brought to the operating room and underwent a general anesthetic. After adequate levels anesthesia obtained patient's left lower extremity was prepped using Betadine paint and draped into a sterile field a timeout was called. Patient had progressive necrosis of the fascia with foul-smelling odor. The fascia was incised sharply the wound now extends from her mid thigh to her ankle approximately three quarters a circumferential around the leg. There was progressive nonviable muscle this was removed with a Ronjair and knife. Electrocautery was used for hemostasis. The muscle did not have good consistency did not have good color and did not have good contractility. The wound was irrigated with pulsatile lavage electrocautery was used for further hemostasis 3 of the reticulating foam dressings were used to cover the wound. This was set to suction. The instillation was discontinued  due to the fluid collection from the previous instillation therapy. Patient will need further surgery skin grafting is not an option.

## 2017-05-30 NOTE — Progress Notes (Signed)
Blood transfusion completed. Pt in no apparent distress.  Temp 98.8 BP 109/40 HR 68 AFib RR 20 O2 98%

## 2017-05-30 NOTE — Anesthesia Postprocedure Evaluation (Signed)
Anesthesia Post Note  Patient: [name]  Procedure(s) Performed: DEBRIDEMENT LEFT LEG AND THIGH (Left )     Patient location during evaluation: PACU Anesthesia Type: General Level of consciousness: awake and alert Pain management: pain level controlled Vital Signs Assessment: post-procedure vital signs reviewed and stable Respiratory status: spontaneous breathing, nonlabored ventilation and respiratory function stable Cardiovascular status: blood pressure returned to baseline and stable Postop Assessment: no apparent nausea or vomiting Anesthetic complications: no    Last Vitals:  Vitals:   06/06/2017 1119 06/16/2017 1145  BP: 101/66   Pulse: (!) 59   Resp: 20   Temp:  36.5 C  SpO2: 100%     Last Pain:  Vitals:   06/05/2017 1330  TempSrc:   PainSc: 10-Worst pain ever                 Lynda Rainwater

## 2017-05-30 NOTE — Transfer of Care (Signed)
Immediate Anesthesia Transfer of Care Note  Patient: [name]  Procedure(s) Performed: DEBRIDEMENT LEFT LEG AND THIGH (Left )  Patient Location: PACU  Anesthesia Type:General  Level of Consciousness: oriented, drowsy and patient cooperative  Airway & Oxygen Therapy: Patient Spontanous Breathing and Patient connected to nasal cannula oxygen  Post-op Assessment: Report given to RN and Post -op Vital signs reviewed and stable  Post vital signs: Reviewed  Last Vitals:  Vitals:   05/25/2017 0619 05/31/2017 0813  BP: (!) 92/47 (!) 106/29  Pulse: 83 78  Resp: (!) 21 19  Temp: 37.1 C   SpO2: 100% 97%    Last Pain:  Vitals:   06/13/2017 0813  TempSrc:   PainSc: 7       Patients Stated Pain Goal: 3 (33/61/22 4497)  Complications: No apparent anesthesia complications

## 2017-05-30 NOTE — Progress Notes (Signed)
Receive call from Buffy in OR that pt will be taken to OR this morning. Buffy was informed that pt did not have orders for surgery this morning. Pt has been NPO since MN last night. Buffy said consent form will be obtained in OR.

## 2017-05-30 NOTE — Consult Note (Signed)
   [facility] CM Inpatient Consult   06/04/2017  [place] 12/27/1934 [id number]    [facility] Care Management follow up.  [name] is s/p I&D wound vac placement today.   Spoke with inpatient RNCM and inpatient LCSW. Discussed discharge plan remains SNF when ready.  Will continue to follow.   Marthenia Rolling, MSN-Ed, RN,BSN [facility] Liaison [phone]

## 2017-05-30 NOTE — Progress Notes (Signed)
Received order from Dr. Ellsworth Lennox to give tylenol 650mg  PO prior to blood transfusion.

## 2017-05-31 ENCOUNTER — Encounter (HOSPITAL_COMMUNITY): Payer: Self-pay | Admitting: Orthopedic Surgery

## 2017-05-31 LAB — BASIC METABOLIC PANEL
Anion gap: 7 (ref 5–15)
BUN: 44 mg/dL — AB (ref 6–20)
CALCIUM: 8.4 mg/dL — AB (ref 8.9–10.3)
CHLORIDE: 90 mmol/L — AB (ref 101–111)
CO2: 33 mmol/L — AB (ref 22–32)
CREATININE: 1.08 mg/dL — AB (ref 0.44–1.00)
GFR calc non Af Amer: 47 mL/min — ABNORMAL LOW (ref >60)
GFR, EST AFRICAN AMERICAN: 55 mL/min — AB (ref >60)
GLUCOSE: 95 mg/dL (ref 65–99)
Potassium: 5 mmol/L (ref 3.5–5.1)
Sodium: 130 mmol/L — ABNORMAL LOW (ref 135–145)

## 2017-05-31 LAB — PREPARE RBC (CROSSMATCH)

## 2017-05-31 LAB — CBC
HCT: 21.5 % — ABNORMAL LOW (ref 36.0–46.0)
Hemoglobin: 6.7 g/dL — CL (ref 12.0–15.0)
MCH: 28.3 pg (ref 26.0–34.0)
MCHC: 31.2 g/dL (ref 30.0–36.0)
MCV: 90.7 fL (ref 78.0–100.0)
Platelets: 258 10*3/uL (ref 150–400)
RBC: 2.37 MIL/uL — AB (ref 3.87–5.11)
RDW: 16.2 % — ABNORMAL HIGH (ref 11.5–15.5)
WBC: 27.4 10*3/uL — ABNORMAL HIGH (ref 4.0–10.5)

## 2017-05-31 LAB — MAGNESIUM: MAGNESIUM: 2.2 mg/dL (ref 1.7–2.4)

## 2017-05-31 MED ORDER — AMBRISENTAN 5 MG PO TABS
5.0000 mg | ORAL_TABLET | Freq: Every day | ORAL | Status: DC
  Filled 2017-05-31: qty 1

## 2017-05-31 MED ORDER — FUROSEMIDE 10 MG/ML IJ SOLN
40.0000 mg | Freq: Every day | INTRAMUSCULAR | Status: DC
  Administered 2017-05-31 – 2017-06-03 (×4): 40 mg via INTRAVENOUS
  Filled 2017-05-31 (×4): qty 4

## 2017-05-31 MED ORDER — SODIUM CHLORIDE 0.9 % IV SOLN
Freq: Once | INTRAVENOUS | Status: AC
  Administered 2017-05-31: 13:00:00 via INTRAVENOUS

## 2017-05-31 NOTE — Progress Notes (Signed)
Wound vac in place left lower extremity Looks good No active issues

## 2017-05-31 NOTE — Progress Notes (Signed)
[place] TEAM [place]  DDU:[id number] DOB: 1935/01/09 DOA: 06/02/2017 PCP: Redmond School, MD    Brief Narrative:  82 y.o. female with a Hx of MI, Chronic Atrial Fibrillation, Chronic Diastolic CHF, Pulmonary HTN, severe Tricuspid Valve Regurgitation, chronic respiratory failure (on 2.5 L O2 at night) , CKD stage III, and a recent traumatic left leg hematoma complicated by hypovolemic shock and acute blood loss anemia requiring admission 05/10/17-05/16/17 (she has undergone I&D and now has a wound VAC and will need a skin graft later in the week.) who was again admitted 05/29/2017 with an acute on chronic diastolic CHF exacerbation associated with hypotension and elevated troponin.   Subjective: Hgb dropped this morning in f/u.  Transfusing 2U PRBC today.  Pt is otherwise w/o any complaints.  She denies cp, sob, n/v, or abdom pain.  She has a good appetite.    Assessment & Plan:  Acute on chronic hypoxic respiratory failure / Acute on Chronic Diastolic CHF in the setting of atrial fibrillation / demand ischemia TTE 05/21/17 noted EF 60-65% - CHF remains well compensated - attempt to keep weight at ~102kg - gently diurese   Filed Weights   05/29/17 0408 05/20/2017 0813 05/31/17 0502  Weight: 104.3 kg (230 lb) 104.3 kg (230 lb) 105.2 kg (232 lb)    LEFT LEG Hematoma s/p fall on anticoag Eliquis reversed on 05/11/2017. Surgically debrided 06/12/2017. Returned to OR 10/10 and again 10/12 for I&D.  Care per Dr. Sharol Given    Acute blood loss anemia in the setting of traumatic hematoma Given 1 unit of PRBCs on 05/21/17 - Hgb drifted back down, likely due to weeping from wound and poor nutritional status - 2U PRBC given 10/8 - 1U PRBC given 10/12 - to receive another 2U PRBC today - recheck in AM - hemodynamically stable presently   Recent Labs Lab 06/06/2017 0452 05/29/17 0510 06/10/2017 0249 05/21/2017 1816 05/31/17 0557  HGB 9.0* 7.8* 6.4* 7.0* 6.7*    Pulmonary  HTN April 2017 RIGHT heart catheterization PAH 59/17 mmHg - no volume overload    Paroxysmal Atrial fibrillation (CHADS-VASC 5)  Rate controlled - no anticoagulation in setting of large bleeding event  CAD, Elevated Troponin Troponin elevated mildly, but trend flat.  No symptoms to suggest acute coronary syndrome  Hypotension Most consistent with hypovolemia due to acute blood loss - transfusing again today   Acute kidney injury on CKD stage III (baseline Cr 0.8-1.1)  Acute increase in creatinine likely prerenal due to acute blood loss - crt is stabilizing w volume expansion - now essentially back to baseline   Recent Labs Lab 05/27/17 0250 05/27/2017 0452 05/29/17 0510 05/20/2017 0249 05/31/17 0557  CREATININE 1.05* 0.66 1.21* 1.25* 1.08*    Obesity Body mass index is 40.03 kg/m     DVT prophylaxis: R LE SCD Code Status: FULL CODE Family Communication: spoke with husband at bedside  Disposition Plan: SDU  Consultants:  Dr. Sharol Given   Antimicrobials:  Zosyn 10/12 > Vanc 10/12 > Cefazolin 10/10 >  Objective: Blood pressure (!) 102/41, pulse 64, temperature 98.5 F (36.9 C), temperature source Oral, resp. rate 19, height 5\' 3"  (1.6 m), weight 105.2 kg (232 lb), SpO2 98 %.  Intake/Output Summary (Last 24 hours) at 05/31/17 1729 Last data filed at 05/31/17 1644  Gross per 24 hour  Intake             1245 ml  Output  250 ml  Net              995 ml   Filed Weights   05/29/17 0408 05/21/2017 0813 05/31/17 0502  Weight: 104.3 kg (230 lb) 104.3 kg (230 lb) 105.2 kg (232 lb)    Examination: General: alert and pleasant - no distress Lungs: CTA B w/o wheezing  Cardiovascular: irregularly irregular Abdomen: NT/ND, soft, bowel sounds positive, no rebound Extremities: left lower extremity in extensive dressing  CBC:  Recent Labs Lab 05/19/2017 0452 05/29/17 0510 05/28/2017 0249 05/31/2017 1816 05/31/17 0557  WBC 14.4* 32.2* 28.9* 24.3* 27.4*  HGB 9.0* 7.8*  6.4* 7.0* 6.7*  HCT 30.1* 25.5* 21.0* 22.6* 21.5*  MCV 93.5 94.1 92.1 90.8 90.7  PLT 284 320 312 287 017   Basic Metabolic Panel:  Recent Labs Lab 05/25/17 0404  05/26/17 0321 05/27/17 0250 05/19/2017 0452 05/29/17 0510 05/20/2017 0249 05/31/17 0557  NA  --   < > 133* 136 136 134* 130* 130*  K  --   < > 4.8 3.9 4.4 5.6* 5.4* 5.0  CL  --   < > 92* 94* 93* 92* 88* 90*  CO2  --   < > 32 34* 36* 36* 34* 33*  GLUCOSE  --   < > 97 111* 102* 108* 90 95  BUN  --   < > 52* 47* 34* 45* 51* 44*  CREATININE  --   < > 1.17* 1.05* 0.66 1.21* 1.25* 1.08*  CALCIUM  --   < > 8.8* 8.9 8.9 8.9 8.5* 8.4*  MG 2.1  --  2.0  --   --  2.1 2.1 2.2  < > = values in this interval not displayed. GFR: Estimated Creatinine Clearance: 48.2 mL/min (A) (by C-G formula based on SCr of 1.08 mg/dL (H)).  Liver Function Tests:  Recent Labs Lab 05/27/17 0250  AST 44*  ALT 9*  ALKPHOS 154*  BILITOT 1.1  PROT 5.2*  ALBUMIN 1.8*    CBG:  Recent Labs Lab 05/25/17 0100  GLUCAP 126*    Recent Results (from the past 240 hour(s))  Surgical pcr screen     Status: None   Collection Time: 05/31/2017  6:38 AM  Result Value Ref Range Status   MRSA, PCR NEGATIVE NEGATIVE Final   Staphylococcus aureus NEGATIVE NEGATIVE Final    Comment: (NOTE) The Xpert SA Assay (FDA approved for NASAL specimens in patients 27 years of age and older), is one component of a comprehensive surveillance program. It is not intended to diagnose infection nor to guide or monitor treatment.   Aerobic/Anaerobic Culture (surgical/deep wound)     Status: None (Preliminary result)   Collection Time: 06/16/2017 10:26 AM  Result Value Ref Range Status   Specimen Description TISSUE ABSCESS LEFT LEG  Final   Special Requests PATIENT ON FOLLOWING ANCEF 2G  Final   Gram Stain   Final    MODERATE WBC PRESENT, PREDOMINANTLY PMN ABUNDANT GRAM NEGATIVE RODS MODERATE GRAM POSITIVE COCCI    Culture   Final    ABUNDANT GRAM NEGATIVE  RODS ABUNDANT PSEUDOMONAS AERUGINOSA ABUNDANT MORGANELLA MORGANII ABUNDANT PROTEUS MIRABILIS HOLDING FOR POSSIBLE ANAEROBE    Report Status PENDING  Incomplete     Scheduled Meds: . brimonidine  1 drop Right Eye BID  . brinzolamide  1 drop Both Eyes BID  . docusate sodium  100 mg Oral BID  . Gerhardt's butt cream   Topical BID  . latanoprost  1 drop Right Eye QHS  .  midodrine  10 mg Oral TID WC  . sodium chloride flush  3 mL Intravenous Q12H  . timolol  1 drop Both Eyes BID     LOS: 12 days   Cherene Altes, MD [facility] Office  [phone] Pager - Text Page per Shea Evans as per below:  On-Call/Text Page:      [email or web address]      password [email or web address]  If 7PM-7AM, please contact night-coverage [email or web address] Password [email or web address] 05/31/2017, 5:29 PM

## 2017-05-31 NOTE — Progress Notes (Signed)
RN paged Dr. Thereasa Solo with [facility] pertaining to patient's hemoglobin of 6.7 per labs this morning.

## 2017-06-01 LAB — TYPE AND SCREEN
ABO/RH(D): B NEG
ANTIBODY SCREEN: NEGATIVE
UNIT DIVISION: 0
UNIT DIVISION: 0
Unit division: 0

## 2017-06-01 LAB — BPAM RBC
Blood Product Expiration Date: 201811022359
Blood Product Expiration Date: 201811022359
Blood Product Expiration Date: 201811112359
ISSUE DATE / TIME: 201810120520
ISSUE DATE / TIME: 201810131230
ISSUE DATE / TIME: 201810131621
UNIT TYPE AND RH: 1700
Unit Type and Rh: 1700
Unit Type and Rh: 1700

## 2017-06-01 LAB — BASIC METABOLIC PANEL
Anion gap: 8 (ref 5–15)
BUN: 38 mg/dL — ABNORMAL HIGH (ref 6–20)
CALCIUM: 8 mg/dL — AB (ref 8.9–10.3)
CO2: 34 mmol/L — AB (ref 22–32)
CREATININE: 1.03 mg/dL — AB (ref 0.44–1.00)
Chloride: 90 mmol/L — ABNORMAL LOW (ref 101–111)
GFR calc Af Amer: 58 mL/min — ABNORMAL LOW (ref >60)
GFR calc non Af Amer: 50 mL/min — ABNORMAL LOW (ref >60)
GLUCOSE: 96 mg/dL (ref 65–99)
Potassium: 3.7 mmol/L (ref 3.5–5.1)
Sodium: 132 mmol/L — ABNORMAL LOW (ref 135–145)

## 2017-06-01 LAB — CBC
HCT: 25.1 % — ABNORMAL LOW (ref 36.0–46.0)
HEMOGLOBIN: 7.9 g/dL — AB (ref 12.0–15.0)
MCH: 27.9 pg (ref 26.0–34.0)
MCHC: 31.5 g/dL (ref 30.0–36.0)
MCV: 88.7 fL (ref 78.0–100.0)
Platelets: 232 10*3/uL (ref 150–400)
RBC: 2.83 MIL/uL — ABNORMAL LOW (ref 3.87–5.11)
RDW: 15.8 % — AB (ref 11.5–15.5)
WBC: 21.5 10*3/uL — ABNORMAL HIGH (ref 4.0–10.5)

## 2017-06-01 NOTE — Plan of Care (Signed)
Problem: Bowel/Gastric: Goal: Will not experience complications related to bowel motility Outcome: Progressing Pt had BM this shift.

## 2017-06-01 NOTE — Progress Notes (Signed)
Physical Therapy Treatment Patient Details Name: [name] MRN: [id number] DOB: Jun 04, 1935 Today's Date: 06/01/2017    History of Present Illness 82 yo female admitted 06/08/2017 from [facility] with hypotension, CHF exac, elevated troponin.  Recent hosp admit after fall with injury to LLE.     PMH:  Afib, CHF, CKD, HTN, arthritis, CAD, MI, obesity. S/P excisional debridement of massive necrotic soft tissue medial left thigh and left calf with wound VAC placement.    PT Comments    Continuing work on functional mobility and activity tolerance;  Still requiring Max to Total assist for transfers, but showing improving activity tolerance today, able to tolerate 2 transfers   Follow Up Recommendations  SNF     Equipment Recommendations  None recommended by PT    Recommendations for Other Services       Precautions / Restrictions Precautions Precautions: Fall Precaution Comments: wound vac Restrictions LLE Weight Bearing: Weight bearing as tolerated    Mobility  Bed Mobility Overal bed mobility: Needs Assistance       Supine to sit: Max assist;+2 for physical assistance Sit to supine: Max assist;+2 for physical assistance   General bed mobility comments: assist for all aspects  Transfers Overall transfer level: Needs assistance Equipment used: 2 person hand held assist Transfers: Lateral/Scoot Transfers;Anterior-Posterior Transfer       Anterior-Posterior transfers: +2 physical assistance;Total assist  Lateral/Scoot Transfers: +2 physical assistance;Max assist General transfer comment: performed lateral scoot transfer bed to recliner with 2 person max assist, RLE blocked for stability; noting good effeort at weight shift and using RLE to push, still, unable to lift hips and lateral scoot was performed rather than squat pivot; Difficulty getting positioned in chair and transferred back to bed via ant/post with +2 total assist  Ambulation/Gait                  Stairs            Wheelchair Mobility    Modified Rankin (Stroke Patients Only)       Balance     Sitting balance-Leahy Scale: Poor Sitting balance - Comments: Requires BUE support and external support to maintain sitting balance; Performed thoracic extension and rotation right/left using sheet with deep breathing. Performed reaching activities to initiate forward flexion.                                     Cognition Arousal/Alertness: Awake/alert Behavior During Therapy: WFL for tasks assessed/performed Overall Cognitive Status: No family/caregiver present to determine baseline cognitive functioning                                 General Comments: Seems WFL for basic mobility tasks but needs to be further assessed.      Exercises      General Comments General comments (skin integrity, edema, etc.): Wound VAC      Pertinent Vitals/Pain Pain Assessment: Faces Faces Pain Scale: Hurts whole lot Pain Location: LLE during mobility Pain Descriptors / Indicators: Guarding;Grimacing;Sore Pain Intervention(s): Monitored during session    Home Living                      Prior Function            PT Goals (current goals can now be found in the care plan  section) Acute Rehab PT Goals Patient Stated Goal: get better PT Goal Formulation: With patient Time For Goal Achievement: 05/28/2017 Potential to Achieve Goals: Fair Progress towards PT goals: Progressing toward goals    Frequency    Min 2X/week      PT Plan Current plan remains appropriate    Co-evaluation              AM-PAC PT "6 Clicks" Daily Activity  Outcome Measure  Difficulty turning over in bed (including adjusting bedclothes, sheets and blankets)?: Unable Difficulty moving from lying on back to sitting on the side of the bed? : Unable Difficulty sitting down on and standing up from a chair with arms (e.g., wheelchair, bedside commode,  etc,.)?: Unable Help needed moving to and from a bed to chair (including a wheelchair)?: Total Help needed walking in hospital room?: Total Help needed climbing 3-5 steps with a railing? : Total 6 Click Score: 6    End of Session Equipment Utilized During Treatment: Oxygen Activity Tolerance: Patient limited by pain Patient left: in bed;with call bell/phone within reach Nurse Communication: Mobility status PT Visit Diagnosis: Other abnormalities of gait and mobility (R26.89);Muscle weakness (generalized) (M62.81);History of falling (Z91.81);Pain Pain - Right/Left: Left Pain - part of body: Leg     Time: 5456-2563 PT Time Calculation (min) (ACUTE ONLY): 44 min  Charges:  $Therapeutic Activity: 38-52 mins                    G Codes:       Roney Marion, PT  Acute Rehabilitation Services Pager [phone] Office [place] 06/01/2017, 11:19 AM

## 2017-06-01 NOTE — Progress Notes (Signed)
[place] TEAM [place]  YWV:[id number] DOB: May 09, 1935 DOA: 05/29/2017 PCP: Redmond School, MD    Brief Narrative:  82 y.o. female with a Hx of MI, Chronic Atrial Fibrillation, Chronic Diastolic CHF, Pulmonary HTN, severe Tricuspid Valve Regurgitation, chronic respiratory failure (on 2.5 L O2 at night) , CKD stage III, and a recent traumatic left leg hematoma complicated by hypovolemic shock and acute blood loss anemia requiring admission 05/10/17-05/16/17 (she has undergone I&D and now has a wound VAC and will need a skin graft later in the week.) who was again admitted 06/13/2017 with an acute on chronic diastolic CHF exacerbation associated with hypotension and elevated troponin.   Subjective: Resting comfortably in bed with no complaints.  Denies chest pain or shortness of breath.  Reports good appetite.  Assessment & Plan:  Acute on chronic hypoxic respiratory failure / Acute on Chronic Diastolic CHF in the setting of atrial fibrillation / demand ischemia TTE 05/21/17 noted EF 60-65% - CHF remains well compensated - attempt to keep weight at ~102kg - gently diuresing  Filed Weights   05/21/2017 0813 05/31/17 0502 06/01/17 0555  Weight: 104.3 kg (230 lb) 105.2 kg (232 lb) 104.8 kg (231 lb)    LEFT LEG Hematoma s/p fall on anticoag Eliquis reversed on 05/11/2017. Surgically debrided 05/27/2017. Returned to OR 10/10 and again 10/12 for I&D.  Care per Dr. Sharol Given    Acute blood loss anemia in the setting of traumatic hematoma Given 1 unit of PRBCs on 05/21/17 - Hgb drifted back down, likely due to weeping from wound and poor nutritional status - 2U PRBC given 10/8 - 1U PRBC given 10/12 - to receive another 2U PRBC today - recheck in AM - hemodynamically stable presently   Recent Labs Lab 05/29/17 0510 06/05/2017 0249 06/13/2017 1816 05/31/17 0557 06/01/17 0408  HGB 7.8* 6.4* 7.0* 6.7* 7.9*    Pulmonary HTN April 2017 RIGHT heart catheterization PAH 59/17 mmHg -  no volume overload    Paroxysmal Atrial fibrillation (CHADS-VASC 5)  Rate controlled - no anticoagulation in setting of large bleeding event  CAD, Elevated Troponin Troponin elevated mildly, but trend flat.  No symptoms to suggest acute coronary syndrome  Hypotension Most consistent with hypovolemia due to acute blood loss    Acute kidney injury on CKD stage III (baseline Cr 0.8-1.1)  Acute increase in creatinine likely prerenal due to acute blood loss - crt is stabilizing w volume expansion - now essentially back to baseline   Recent Labs Lab 05/24/2017 0452 05/29/17 0510 05/19/2017 0249 05/31/17 0557 06/01/17 0314  CREATININE 0.66 1.21* 1.25* 1.08* 1.03*    Obesity Body mass index is 40.03 kg/m     DVT prophylaxis: R LE SCD Code Status: FULL CODE Family Communication: spoke with husband at bedside  Disposition Plan: stable for tele bed   Consultants:  Dr. Sharol Given   Antimicrobials:  Zosyn 10/12 > Vanc 10/12 > Cefazolin 10/10 >  Objective: Blood pressure (!) 114/55, pulse (!) 56, temperature 98.7 F (37.1 C), temperature source Axillary, resp. rate 16, height 5\' 3"  (1.6 m), weight 104.8 kg (231 lb), SpO2 97 %.  Intake/Output Summary (Last 24 hours) at 06/01/17 1503 Last data filed at 06/01/17 0920  Gross per 24 hour  Intake             1763 ml  Output             1850 ml  Net              -  87 ml   Filed Weights   05/29/2017 0813 05/31/17 0502 06/01/17 0555  Weight: 104.3 kg (230 lb) 105.2 kg (232 lb) 104.8 kg (231 lb)    Examination: General: alert and pleasant  Lungs: CTA B Cardiovascular: irregularly irregular - rate controlled  Abdomen: NT/ND, soft, BS+ Extremities: left lower extremity in extensive dressing  CBC:  Recent Labs Lab 05/29/17 0510 06/09/2017 0249 05/23/2017 1816 05/31/17 0557 06/01/17 0408  WBC 32.2* 28.9* 24.3* 27.4* 21.5*  HGB 7.8* 6.4* 7.0* 6.7* 7.9*  HCT 25.5* 21.0* 22.6* 21.5* 25.1*  MCV 94.1 92.1 90.8 90.7 88.7  PLT 320 312 287  258 527   Basic Metabolic Panel:  Recent Labs Lab 05/26/17 0321  06/11/2017 0452 05/29/17 0510 06/04/2017 0249 05/31/17 0557 06/01/17 0314  NA 133*  < > 136 134* 130* 130* 132*  K 4.8  < > 4.4 5.6* 5.4* 5.0 3.7  CL 92*  < > 93* 92* 88* 90* 90*  CO2 32  < > 36* 36* 34* 33* 34*  GLUCOSE 97  < > 102* 108* 90 95 96  BUN 52*  < > 34* 45* 51* 44* 38*  CREATININE 1.17*  < > 0.66 1.21* 1.25* 1.08* 1.03*  CALCIUM 8.8*  < > 8.9 8.9 8.5* 8.4* 8.0*  MG 2.0  --   --  2.1 2.1 2.2  --   < > = values in this interval not displayed. GFR: Estimated Creatinine Clearance: 50.5 mL/min (A) (by C-G formula based on SCr of 1.03 mg/dL (H)).  Liver Function Tests:  Recent Labs Lab 05/27/17 0250  AST 44*  ALT 9*  ALKPHOS 154*  BILITOT 1.1  PROT 5.2*  ALBUMIN 1.8*     Recent Results (from the past 240 hour(s))  Surgical pcr screen     Status: None   Collection Time: 06/16/2017  6:38 AM  Result Value Ref Range Status   MRSA, PCR NEGATIVE NEGATIVE Final   Staphylococcus aureus NEGATIVE NEGATIVE Final    Comment: (NOTE) The Xpert SA Assay (FDA approved for NASAL specimens in patients 40 years of age and older), is one component of a comprehensive surveillance program. It is not intended to diagnose infection nor to guide or monitor treatment.   Aerobic/Anaerobic Culture (surgical/deep wound)     Status: None (Preliminary result)   Collection Time: 06/01/2017 10:26 AM  Result Value Ref Range Status   Specimen Description TISSUE ABSCESS LEFT LEG  Final   Special Requests PATIENT ON FOLLOWING ANCEF 2G  Final   Gram Stain   Final    MODERATE WBC PRESENT, PREDOMINANTLY PMN ABUNDANT GRAM NEGATIVE RODS MODERATE GRAM POSITIVE COCCI    Culture   Final    ABUNDANT ESCHERICHIA COLI ABUNDANT PSEUDOMONAS AERUGINOSA ABUNDANT MORGANELLA MORGANII ABUNDANT PROTEUS MIRABILIS ABUNDANT PREVOTELLA BIVIA BETA LACTAMASE POSITIVE    Report Status PENDING  Incomplete   Organism ID, Bacteria ESCHERICHIA COLI   Final   Organism ID, Bacteria PSEUDOMONAS AERUGINOSA  Final   Organism ID, Bacteria MORGANELLA MORGANII  Final   Organism ID, Bacteria PROTEUS MIRABILIS  Final      Susceptibility   Escherichia coli - MIC*    AMPICILLIN 4 SENSITIVE Sensitive     CEFAZOLIN <=4 SENSITIVE Sensitive     CEFEPIME <=1 SENSITIVE Sensitive     CEFTAZIDIME <=1 SENSITIVE Sensitive     CEFTRIAXONE <=1 SENSITIVE Sensitive     CIPROFLOXACIN <=0.25 SENSITIVE Sensitive     GENTAMICIN <=1 SENSITIVE Sensitive     IMIPENEM <=0.25 SENSITIVE  Sensitive     TRIMETH/SULFA <=20 SENSITIVE Sensitive     AMPICILLIN/SULBACTAM <=2 SENSITIVE Sensitive     PIP/TAZO <=4 SENSITIVE Sensitive     Extended ESBL NEGATIVE Sensitive     * ABUNDANT ESCHERICHIA COLI   Morganella morganii - MIC*    AMPICILLIN >=32 RESISTANT Resistant     CEFAZOLIN >=64 RESISTANT Resistant     CEFEPIME <=1 SENSITIVE Sensitive     CEFTAZIDIME <=1 SENSITIVE Sensitive     CEFTRIAXONE <=1 SENSITIVE Sensitive     CIPROFLOXACIN <=0.25 SENSITIVE Sensitive     GENTAMICIN <=1 SENSITIVE Sensitive     IMIPENEM 4 SENSITIVE Sensitive     TRIMETH/SULFA <=20 SENSITIVE Sensitive     AMPICILLIN/SULBACTAM 16 INTERMEDIATE Intermediate     PIP/TAZO <=4 SENSITIVE Sensitive     * ABUNDANT MORGANELLA MORGANII   Pseudomonas aeruginosa - MIC*    CEFTAZIDIME <=1 SENSITIVE Sensitive     CIPROFLOXACIN <=0.25 SENSITIVE Sensitive     GENTAMICIN <=1 SENSITIVE Sensitive     IMIPENEM 2 SENSITIVE Sensitive     PIP/TAZO <=4 SENSITIVE Sensitive     CEFEPIME <=1 SENSITIVE Sensitive     * ABUNDANT PSEUDOMONAS AERUGINOSA   Proteus mirabilis - MIC*    AMPICILLIN <=2 SENSITIVE Sensitive     CEFAZOLIN 8 SENSITIVE Sensitive     CEFEPIME <=1 SENSITIVE Sensitive     CEFTAZIDIME <=1 SENSITIVE Sensitive     CEFTRIAXONE <=1 SENSITIVE Sensitive     CIPROFLOXACIN <=0.25 SENSITIVE Sensitive     GENTAMICIN <=1 SENSITIVE Sensitive     IMIPENEM 4 SENSITIVE Sensitive     TRIMETH/SULFA <=20  SENSITIVE Sensitive     AMPICILLIN/SULBACTAM <=2 SENSITIVE Sensitive     PIP/TAZO <=4 SENSITIVE Sensitive     * ABUNDANT PROTEUS MIRABILIS     Scheduled Meds: . ambrisentan  5 mg Oral Daily  . brimonidine  1 drop Right Eye BID  . brinzolamide  1 drop Both Eyes BID  . docusate sodium  100 mg Oral BID  . furosemide  40 mg Intravenous Daily  . Gerhardt's butt cream   Topical BID  . latanoprost  1 drop Right Eye QHS  . midodrine  10 mg Oral TID WC  . sodium chloride flush  3 mL Intravenous Q12H  . timolol  1 drop Both Eyes BID     LOS: 13 days   Cherene Altes, MD [facility] Office  [phone] Pager - Text Page per Shea Evans as per below:  On-Call/Text Page:      [email or web address]      password [email or web address]  If 7PM-7AM, please contact night-coverage [email or web address] Password [email or web address] 06/01/2017, 3:03 PM

## 2017-06-02 ENCOUNTER — Other Ambulatory Visit (INDEPENDENT_AMBULATORY_CARE_PROVIDER_SITE_OTHER): Payer: Self-pay | Admitting: Family

## 2017-06-02 LAB — AEROBIC/ANAEROBIC CULTURE W GRAM STAIN (SURGICAL/DEEP WOUND)

## 2017-06-02 LAB — AEROBIC/ANAEROBIC CULTURE (SURGICAL/DEEP WOUND)

## 2017-06-02 LAB — CBC
HCT: 27.5 % — ABNORMAL LOW (ref 36.0–46.0)
Hemoglobin: 8.5 g/dL — ABNORMAL LOW (ref 12.0–15.0)
MCH: 28.4 pg (ref 26.0–34.0)
MCHC: 30.9 g/dL (ref 30.0–36.0)
MCV: 92 fL (ref 78.0–100.0)
PLATELETS: 236 10*3/uL (ref 150–400)
RBC: 2.99 MIL/uL — ABNORMAL LOW (ref 3.87–5.11)
RDW: 16.3 % — AB (ref 11.5–15.5)
WBC: 17.3 10*3/uL — AB (ref 4.0–10.5)

## 2017-06-02 MED ORDER — LINEZOLID 600 MG/300ML IV SOLN: 600.0000 mg | Freq: Two times a day (BID) | INTRAVENOUS | Status: DC

## 2017-06-02 MED ORDER — AMBRISENTAN 5 MG PO TABS
5.0000 mg | ORAL_TABLET | Freq: Every day | ORAL | Status: DC
  Administered 2017-06-02 – 2017-06-03 (×2): 5 mg via ORAL
  Filled 2017-06-02 (×5): qty 1

## 2017-06-02 MED ORDER — LINEZOLID 600 MG/300ML IV SOLN
600.0000 mg | Freq: Two times a day (BID) | INTRAVENOUS | Status: DC
  Administered 2017-06-02 – 2017-06-07 (×9): 600 mg via INTRAVENOUS
  Filled 2017-06-02 (×12): qty 300

## 2017-06-02 NOTE — Progress Notes (Signed)
[name] [place]  BMW:[id number] DOB: 1934/11/04 DOA: 05/31/2017 PCP: Redmond School, MD    Brief Narrative:  82 y.o. female with a Hx of MI, Chronic Atrial Fibrillation, Chronic Diastolic CHF, Pulmonary HTN, severe Tricuspid Valve Regurgitation, chronic respiratory failure (on 2.5 L O2 at night) , CKD stage III, and a recent traumatic left leg hematoma complicated by hypovolemic shock and acute blood loss anemia requiring admission 05/10/17-05/16/17 (she has undergone I&D and now has a wound VAC and will need a skin graft later in the week.) who was again admitted 06/06/2017 with an acute on chronic diastolic CHF exacerbation associated with hypotension and elevated troponin.   Subjective: The patient is resting comfortably in bed.  She denies chest pain or shortness of breath.  Assessment & Plan:  Acute on chronic hypoxic respiratory failure / Acute on Chronic Diastolic CHF in the setting of atrial fibrillation / demand ischemia TTE 05/21/17 noted EF 60-65% - CHF remains well compensated - attempt to keep weight at ~100-102kg  Filed Weights   06/01/17 0555 06/02/17 0400 06/02/17 0421  Weight: 104.8 kg (231 lb) 99.3 kg (219 lb) 100.2 kg (221 lb)    LEFT LEG Hematoma s/p fall on anticoag - polymicrobial wound infection  Eliquis reversed on 05/11/2017 - surgically debrided 06/08/2017 - returned to OR 10/10 and again 10/12 for I&D - care per Dr. Sharol Given - adjust abx coverage due to presence of VRE - Zosyn should cover all other noted bacteria   Acute blood loss anemia in the setting of traumatic hematoma Given 1 unit of PRBCs on 05/21/17 - Hgb drifted back down, likely due to weeping from wound and poor nutritional status - 2U PRBC given 10/8 - 1U PRBC given 10/12 - 2U PRBC given 10/14 - hemodynamically stable presently   Recent Labs Lab 06/16/2017 0249 05/20/2017 1816 05/31/17 0557 06/01/17 0408 06/02/17 0310  HGB 6.4* 7.0* 6.7* 7.9* 8.5*    Pulmonary  HTN April 2017 RIGHT heart catheterization PAH 59/17 mmHg - no volume overload - resumed home meds   Paroxysmal Atrial fibrillation (CHADS-VASC 5)  Rate controlled - no anticoagulation in setting of large bleeding event  CAD, Elevated Troponin Troponin elevated mildly, but trend was flat - no symptoms to suggest acute coronary syndrome  Hypotension Most consistent with hypovolemia due to acute blood loss - BP has improved w/ transfusions   Acute kidney injury on CKD stage III (baseline Cr 0.8-1.1)  Acute increase in creatinine likely prerenal due to acute blood loss - crt stabilized w volume expansion - now back to baseline range   Recent Labs Lab 05/23/2017 0452 05/29/17 0510 06/10/2017 0249 05/31/17 0557 06/01/17 0314  CREATININE 0.66 1.21* 1.25* 1.08* 1.03*    Obesity Body mass index is 40.03 kg/m     DVT prophylaxis: R LE SCD Code Status: FULL CODE Family Communication: spoke with husband at bedside again today  Disposition Plan: tele bed   Consultants:  Dr. Sharol Given   Antimicrobials:  Zosyn 10/12 > Vanc 10/12 > Cefazolin 10/10 >  Objective: Blood pressure (!) 124/57, pulse 61, temperature 98.6 F (37 C), temperature source Oral, resp. rate (!) 24, height 5\' 3"  (1.6 m), weight 100.2 kg (221 lb), SpO2 100 %.  Intake/Output Summary (Last 24 hours) at 06/02/17 1514 Last data filed at 06/02/17 1100  Gross per 24 hour  Intake              332 ml  Output  1800 ml  Net            -1468 ml   Filed Weights   06/01/17 0555 06/02/17 0400 06/02/17 0421  Weight: 104.8 kg (231 lb) 99.3 kg (219 lb) 100.2 kg (221 lb)    Examination: General: alert and pleasant - NAD Lungs: CTA B w/o wheezing  Cardiovascular: irregularly irregular - rate controlled - no M  Abdomen: NT/ND, soft, BS+ Extremities: left lower extremity in extensive dressing - R LE w/ trace edema   CBC:  Recent Labs Lab 05/19/2017 0249 05/21/2017 1816 05/31/17 0557 06/01/17 0408 06/02/17 0310   WBC 28.9* 24.3* 27.4* 21.5* 17.3*  HGB 6.4* 7.0* 6.7* 7.9* 8.5*  HCT 21.0* 22.6* 21.5* 25.1* 27.5*  MCV 92.1 90.8 90.7 88.7 92.0  PLT 312 287 258 232 834   Basic Metabolic Panel:  Recent Labs Lab 06/15/2017 0452 05/29/17 0510 05/26/2017 0249 05/31/17 0557 06/01/17 0314  NA 136 134* 130* 130* 132*  K 4.4 5.6* 5.4* 5.0 3.7  CL 93* 92* 88* 90* 90*  CO2 36* 36* 34* 33* 34*  GLUCOSE 102* 108* 90 95 96  BUN 34* 45* 51* 44* 38*  CREATININE 0.66 1.21* 1.25* 1.08* 1.03*  CALCIUM 8.9 8.9 8.5* 8.4* 8.0*  MG  --  2.1 2.1 2.2  --    GFR: Estimated Creatinine Clearance: 49.2 mL/min (A) (by C-G formula based on SCr of 1.03 mg/dL (H)).  Liver Function Tests:  Recent Labs Lab 05/27/17 0250  AST 44*  ALT 9*  ALKPHOS 154*  BILITOT 1.1  PROT 5.2*  ALBUMIN 1.8*     Recent Results (from the past 240 hour(s))  Aerobic/Anaerobic Culture (surgical/deep wound)     Status: None   Collection Time: 05/30/2017 10:26 AM  Result Value Ref Range Status   Specimen Description TISSUE ABSCESS LEFT LEG  Final   Special Requests PATIENT ON FOLLOWING ANCEF 2G  Final   Gram Stain   Final    MODERATE WBC PRESENT, PREDOMINANTLY PMN ABUNDANT GRAM NEGATIVE RODS MODERATE GRAM POSITIVE COCCI    Culture   Final    ABUNDANT ESCHERICHIA COLI ABUNDANT PSEUDOMONAS AERUGINOSA ABUNDANT MORGANELLA MORGANII ABUNDANT PROTEUS MIRABILIS ABUNDANT PREVOTELLA BIVIA BETA LACTAMASE POSITIVE MODERATE VANCOMYCIN RESISTANT ENTEROCOCCUS    Report Status 06/02/2017 FINAL  Final   Organism ID, Bacteria ESCHERICHIA COLI  Final   Organism ID, Bacteria PSEUDOMONAS AERUGINOSA  Final   Organism ID, Bacteria MORGANELLA MORGANII  Final   Organism ID, Bacteria PROTEUS MIRABILIS  Final   Organism ID, Bacteria VANCOMYCIN RESISTANT ENTEROCOCCUS  Final      Susceptibility   Escherichia coli - MIC*    AMPICILLIN 4 SENSITIVE Sensitive     CEFAZOLIN <=4 SENSITIVE Sensitive     CEFEPIME <=1 SENSITIVE Sensitive     CEFTAZIDIME <=1  SENSITIVE Sensitive     CEFTRIAXONE <=1 SENSITIVE Sensitive     CIPROFLOXACIN <=0.25 SENSITIVE Sensitive     GENTAMICIN <=1 SENSITIVE Sensitive     IMIPENEM <=0.25 SENSITIVE Sensitive     TRIMETH/SULFA <=20 SENSITIVE Sensitive     AMPICILLIN/SULBACTAM <=2 SENSITIVE Sensitive     PIP/TAZO <=4 SENSITIVE Sensitive     Extended ESBL NEGATIVE Sensitive     * ABUNDANT ESCHERICHIA COLI   Morganella morganii - MIC*    AMPICILLIN >=32 RESISTANT Resistant     CEFAZOLIN >=64 RESISTANT Resistant     CEFEPIME <=1 SENSITIVE Sensitive     CEFTAZIDIME <=1 SENSITIVE Sensitive     CEFTRIAXONE <=1 SENSITIVE Sensitive  CIPROFLOXACIN <=0.25 SENSITIVE Sensitive     GENTAMICIN <=1 SENSITIVE Sensitive     IMIPENEM 4 SENSITIVE Sensitive     TRIMETH/SULFA <=20 SENSITIVE Sensitive     AMPICILLIN/SULBACTAM 16 INTERMEDIATE Intermediate     PIP/TAZO <=4 SENSITIVE Sensitive     * ABUNDANT MORGANELLA MORGANII   Pseudomonas aeruginosa - MIC*    CEFTAZIDIME <=1 SENSITIVE Sensitive     CIPROFLOXACIN <=0.25 SENSITIVE Sensitive     GENTAMICIN <=1 SENSITIVE Sensitive     IMIPENEM 2 SENSITIVE Sensitive     PIP/TAZO <=4 SENSITIVE Sensitive     CEFEPIME <=1 SENSITIVE Sensitive     * ABUNDANT PSEUDOMONAS AERUGINOSA   Proteus mirabilis - MIC*    AMPICILLIN <=2 SENSITIVE Sensitive     CEFAZOLIN 8 SENSITIVE Sensitive     CEFEPIME <=1 SENSITIVE Sensitive     CEFTAZIDIME <=1 SENSITIVE Sensitive     CEFTRIAXONE <=1 SENSITIVE Sensitive     CIPROFLOXACIN <=0.25 SENSITIVE Sensitive     GENTAMICIN <=1 SENSITIVE Sensitive     IMIPENEM 4 SENSITIVE Sensitive     TRIMETH/SULFA <=20 SENSITIVE Sensitive     AMPICILLIN/SULBACTAM <=2 SENSITIVE Sensitive     PIP/TAZO <=4 SENSITIVE Sensitive     * ABUNDANT PROTEUS MIRABILIS   Vancomycin resistant enterococcus - MIC*    AMPICILLIN >=32 RESISTANT Resistant     VANCOMYCIN >=32 RESISTANT Resistant     GENTAMICIN SYNERGY RESISTANT Resistant     LINEZOLID 2 SENSITIVE Sensitive      * MODERATE VANCOMYCIN RESISTANT ENTEROCOCCUS     Scheduled Meds: . ambrisentan  5 mg Oral Daily  . brimonidine  1 drop Right Eye BID  . brinzolamide  1 drop Both Eyes BID  . docusate sodium  100 mg Oral BID  . furosemide  40 mg Intravenous Daily  . Gerhardt's butt cream   Topical BID  . latanoprost  1 drop Right Eye QHS  . midodrine  10 mg Oral TID WC  . sodium chloride flush  3 mL Intravenous Q12H  . timolol  1 drop Both Eyes BID     LOS: 14 days   Cherene Altes, MD [facility] Office  [phone] Pager - Text Page per Shea Evans as per below:  On-Call/Text Page:      [email or web address]      password [email or web address]  If 7PM-7AM, please contact night-coverage [email or web address] Password [email or web address] 06/02/2017, 3:14 PM

## 2017-06-02 NOTE — Progress Notes (Signed)
CSW continues to follow for SNF placement. Noted ortho plan to bring patient back to OR 10/17. CSW re-sent referrals to patient's top SNF choices, [facility] and [facility], with updated notes. CSW to follow and support with SNF bed choice and discharge when medically stable.  Estanislado Emms, [place]

## 2017-06-02 NOTE — Care Management Important Message (Signed)
Important Message  Patient Details  Name: [name] MRN: [id number] Date of Birth: [birth year]   Medicare Important Message Given:  Yes    Twylah Bennetts [place] 06/02/2017, 11:00 AM

## 2017-06-02 NOTE — Progress Notes (Signed)
Patient ID: [name], female   DOB: 07-07-1935, 82 y.o.   MRN: [id number] Patient has approximately 300 mL in the wound VAC canister. We will plan to return to the operating room on Wednesday. The last 2 times to the operating room we have had a necrotic layer of fibrinous tissue covering the whole wound. If we do not make any progress with the wound bed we may need to consider more aggressive surgery.

## 2017-06-03 LAB — CBC
HEMATOCRIT: 27.3 % — AB (ref 36.0–46.0)
Hemoglobin: 8.1 g/dL — ABNORMAL LOW (ref 12.0–15.0)
MCH: 27.9 pg (ref 26.0–34.0)
MCHC: 29.7 g/dL — AB (ref 30.0–36.0)
MCV: 94.1 fL (ref 78.0–100.0)
Platelets: 265 10*3/uL (ref 150–400)
RBC: 2.9 MIL/uL — ABNORMAL LOW (ref 3.87–5.11)
RDW: 16.4 % — AB (ref 11.5–15.5)
WBC: 11.6 10*3/uL — AB (ref 4.0–10.5)

## 2017-06-03 LAB — COMPREHENSIVE METABOLIC PANEL
ALT: <5 U/L — ABNORMAL LOW (ref 14–54)
AST: 32 U/L (ref 15–41)
Albumin: 1.4 g/dL — ABNORMAL LOW (ref 3.5–5.0)
Alkaline Phosphatase: 194 U/L — ABNORMAL HIGH (ref 38–126)
Anion gap: 8 (ref 5–15)
BILIRUBIN TOTAL: 0.6 mg/dL (ref 0.3–1.2)
BUN: 21 mg/dL — AB (ref 6–20)
CO2: 40 mmol/L — ABNORMAL HIGH (ref 22–32)
Calcium: 8.3 mg/dL — ABNORMAL LOW (ref 8.9–10.3)
Chloride: 89 mmol/L — ABNORMAL LOW (ref 101–111)
Creatinine, Ser: 0.92 mg/dL (ref 0.44–1.00)
GFR calc Af Amer: >60 mL/min (ref >60)
GFR, EST NON AFRICAN AMERICAN: 57 mL/min — AB (ref >60)
Glucose, Bld: 121 mg/dL — ABNORMAL HIGH (ref 65–99)
POTASSIUM: 2.9 mmol/L — AB (ref 3.5–5.1)
Sodium: 137 mmol/L (ref 135–145)
TOTAL PROTEIN: 4.4 g/dL — AB (ref 6.5–8.1)

## 2017-06-03 MED ORDER — CEFAZOLIN SODIUM-DEXTROSE 2-4 GM/100ML-% IV SOLN
2.0000 g | INTRAVENOUS | Status: AC
  Administered 2017-06-04: 2 g via INTRAVENOUS
  Filled 2017-06-03: qty 100

## 2017-06-03 MED ORDER — POTASSIUM CHLORIDE CRYS ER 20 MEQ PO TBCR
40.0000 meq | EXTENDED_RELEASE_TABLET | Freq: Once | ORAL | Status: AC
  Administered 2017-06-03: 40 meq via ORAL
  Filled 2017-06-03: qty 2

## 2017-06-03 MED ORDER — CHLORHEXIDINE GLUCONATE 4 % EX LIQD
60.0000 mL | Freq: Once | CUTANEOUS | Status: AC
  Administered 2017-06-04: 4 via TOPICAL
  Filled 2017-06-03: qty 60

## 2017-06-03 MED ORDER — FAMOTIDINE 20 MG PO TABS
20.0000 mg | ORAL_TABLET | Freq: Once | ORAL | Status: AC
  Administered 2017-06-03: 20 mg via ORAL
  Filled 2017-06-03: qty 1

## 2017-06-03 NOTE — Progress Notes (Signed)
Patient was laying in her bed awake, No family on-site but patient shared about close family ties and support. Patient very appreciative and receptive of chaplains visitation, Chaplain provided reflective listening and compassionate presence.  Chaplain Kaelei Wheeler.

## 2017-06-03 NOTE — Progress Notes (Signed)
Patient states that she is having a burning in her stomach and would like something for indegestion.  Patient states that she feels uncomfortable in her abdomen.  Patient disimpacted earlier in shift and given Mirlax, and suppository for constipation.  Patient stated at that time that she felt better.  Noted 3 large blood blisters to the inside of upper thighs.  Area cleaned and foam dressing applied to sites.  Will continue to monitor

## 2017-06-03 NOTE — Progress Notes (Signed)
Occupational Therapy Treatment Patient Details Name: [name] MRN: [id number] DOB: [birth year] Today's Date: 06/03/2017    History of present illness 82 yo female admitted 06/16/2017 from [facility] with hypotension, CHF exac, elevated troponin.  Recent hosp admit after fall with injury to LLE.     PMH:  Afib, CHF, CKD, HTN, arthritis, CAD, MI, obesity. S/P excisional debridement of massive necrotic soft tissue medial left thigh and left calf with wound VAC placement.   OT comments  Pt with nausea limiting tolerance of activity. Performed bed level grooming and UB exercises with level 2 theraband.   Follow Up Recommendations  SNF;Supervision/Assistance - 24 hour    Equipment Recommendations       Recommendations for Other Services      Precautions / Restrictions Precautions Precautions: Fall       Mobility Bed Mobility                  Transfers                      Balance                                           ADL either performed or assessed with clinical judgement   ADL   Eating/Feeding: Set up;Bed level Eating/Feeding Details (indicate cue type and reason): ice chips Grooming: Wash/dry hands;Wash/dry face;Set up;Bed level                                       Vision       Perception     Praxis      Cognition Arousal/Alertness: Awake/alert Behavior During Therapy: WFL for tasks assessed/performed Overall Cognitive Status: Impaired/Different from baseline Area of Impairment: Memory;Orientation                 Orientation Level: Time             General Comments: pt forgetful, was unaware of cup of ice on her tray, although just placed there        Exercises Exercises: General Upper Extremity General Exercises - Upper Extremity Shoulder Flexion: Both;10 reps;Strengthening;Theraband Theraband Level (Shoulder Flexion): Level 2 (Red) Shoulder Horizontal ABduction:  Strengthening;Both;10 reps;Theraband Theraband Level (Shoulder Horizontal Abduction): Level 2 (Red)   Shoulder Instructions       General Comments      Pertinent Vitals/ Pain       Pain Assessment: Faces Faces Pain Scale: Hurts little more Pain Location: L LE Pain Descriptors / Indicators: Grimacing;Guarding Pain Intervention(s): Monitored during session  Home Living                                          Prior Functioning/Environment              Frequency  Min 2X/week        Progress Toward Goals  OT Goals(current goals can now be found in the care plan section)  Progress towards OT goals: Not progressing toward goals - comment (limited session due to nausea)  Acute Rehab OT Goals Patient Stated Goal: get better OT Goal Formulation: With patient Time For Goal Achievement: 06/05/17 Potential  to Achieve Goals: Good  Plan Discharge plan remains appropriate    Co-evaluation                 AM-PAC PT "6 Clicks" Daily Activity     Outcome Measure   Help from another person eating meals?: None Help from another person taking care of personal grooming?: A Little Help from another person toileting, which includes using toliet, bedpan, or urinal?: Total Help from another person bathing (including washing, rinsing, drying)?: A Lot Help from another person to put on and taking off regular upper body clothing?: A Lot Help from another person to put on and taking off regular lower body clothing?: Total 6 Click Score: 13    End of Session Equipment Utilized During Treatment: Oxygen  OT Visit Diagnosis: Muscle weakness (generalized) (M62.81)   Activity Tolerance Treatment limited secondary to medical complications (Comment) (nausea)   Patient Left in bed;with call bell/phone within reach;with nursing/sitter in room   Nurse Communication  (aware pt has nausea)        Time: 4599-7741 OT Time Calculation (min): 14 min  Charges: OT  General Charges $OT Visit: 1 Visit OT Treatments $Therapeutic Exercise: 8-22 mins  06/03/2017 Nestor Lewandowsky, OTR/L Pager: [place], [name] 06/03/2017, 9:38 AM

## 2017-06-03 NOTE — Progress Notes (Signed)
Pharmacy Antibiotic Note  [name] is a 82 y.o. female admitted on 06/05/2017 s/p mechanical fall with resulting traumatic hematoma on L thigh and leg. She has been going for repeat I&Ds with placement of wound vacs to help infection. She had been receiving Ancef peri-operatively, but on 10/12 coverage broadened to vancomycin and zosyn. WBC decreased to 11.6., afebrile. SCr 0.92,  back to baseline range.  Wound Cx (abscess L leg):  Polymicrobial wound infection- abx coverage adjusted due to presence of VRE - Vancomycin discontinued on 10/15 and added Linezolid (zyvox) and continued Zosyn as it should cover all other noted bacteria.    Plan: -Continue Zosyn 3.375 g IV q8h -Linezolid 600 mg IV q12h started 10/15 per MD as recommended per ID pharmacist.  -Monitor renal fx, cultures, check VT at [facility] -Plan is to go to OR tomorrow for debridement of left thigh wound   Height: 5\' 3"  (160 cm) Weight: 218 lb (98.9 kg) IBW/kg (Calculated) : 52.4  Temp (24hrs), Avg:98.5 F (36.9 C), Min:97.8 F (36.6 C), Max:98.9 F (37.2 C)   Recent Labs Lab 05/29/17 0510 05/29/2017 0249 05/30/17 1816 05/31/17 0557 06/01/17 0314 06/01/17 0408 06/02/17 0310 06/03/17 0526  WBC 32.2* 28.9* 24.3* 27.4*  --  21.5* 17.3* 11.6*  CREATININE 1.21* 1.25*  --  1.08* 1.03*  --   --  0.92     Antimicrobials this admission: 10/12 vancomycin >10/16 10/12 zosyn >  Dose adjustments this admission: N/A  Microbiology results: 10/10 wound culture (abscess L leg): abundant GNR, moderate GPC   Pseudomonas.-sens to zosyn, pan sens;   Proteus mirabails : Pan sens including zosyn   VRE :  sens to linezolid  E. Coli  pan sens including zosyn  Morganelli  sens to zosyn, R to amp, unasyn, cefazolin 10/5 mrsa pcr: neg   Nicole Cella, RPh Clinical Pharmacist 8a-330p [phone] 330p-1030p phone [phone] or [phone] Main pharmacy [phone] 06/03/2017 5:03 PM

## 2017-06-03 NOTE — Progress Notes (Signed)
[place] TE[facility] [place]  YWV:[id number] DOB: 24-Mar-1935 DOA: 06/16/2017 PCP: Redmond School, MD    Brief Narrative:  82 y.o. female with a Hx of MI, Chronic Atrial Fibrillation, Chronic Diastolic CHF, Pulmonary HTN, severe Tricuspid Valve Regurgitation, chronic respiratory failure (on 2.5 L O2 at night) , CKD stage III, and a recent traumatic left leg hematoma complicated by hypovolemic shock and acute blood loss anemia requiring admission 05/10/17-05/16/17 (she has undergone I&D and now has a wound VAC and will need a skin graft later in the week.) who was again admitted 06/03/2017 with an acute on chronic diastolic CHF exacerbation associated with hypotension and elevated troponin.   Subjective: She reports some nausea this morning which responded well to medical therapy.  She denies chest pain fevers chills or abdominal pain at this time.  Assessment & Plan:  Acute on chronic hypoxic respiratory failure / Acute on Chronic Diastolic CHF in the setting of atrial fibrillation / demand ischemia TTE 05/21/17 noted EF 60-65% - CHF remains well compensated - attempt to keep weight at ~100kg  Filed Weights   06/02/17 0400 06/02/17 0421 06/03/17 0345  Weight: 99.3 kg (219 lb) 100.2 kg (221 lb) 98.9 kg (218 lb)    LEFT LEG Hematoma s/p fall on anticoag - polymicrobial wound infection  Eliquis reversed on 05/11/2017 - surgically debrided 05/31/2017 - returned to OR 10/10 and again 10/12 for I&D - care per Dr. Sharol Given - adjusted abx coverage due to presence of VRE - Zosyn should cover all other noted bacteria   Acute blood loss anemia in the setting of traumatic hematoma Given 1 unit of PRBCs on 05/21/17 - Hgb drifted back down, likely due to weeping from wound and poor nutritional status - 2U PRBC given 10/8 - 1U PRBC given 10/12 - 2U PRBC given 10/14 - hemodynamically stable presently   Recent Labs Lab 06/14/2017 1816 05/31/17 0557 06/01/17 0408 06/02/17 0310  06/03/17 0526  HGB 7.0* 6.7* 7.9* 8.5* 8.1*    Pulmonary HTN April 2017 RIGHT heart catheterization PAH 59/17 mmHg - no volume overload - resumed home meds   Paroxysmal Atrial fibrillation (CHADS-VASC 5)  Rate controlled - no anticoagulation in setting of large bleeding event  CAD, Elevated Troponin Troponin elevated mildly, but trend was flat - no symptoms to suggest acute coronary syndrome  Hypotension Most consistent with hypovolemia due to acute blood loss - BP has improved w/ transfusions   Acute kidney injury on CKD stage III (baseline Cr 0.8-1.1)  Acute increase in creatinine likely prerenal due to acute blood loss - crt stabilized w volume expansion   Recent Labs Lab 05/29/17 0510 06/12/2017 0249 05/31/17 0557 06/01/17 0314 06/03/17 0526  CREATININE 1.21* 1.25* 1.08* 1.03* 0.92   Hypokalemia Due to diuretic tx - supplement and follow - check Mg  Obesity Body mass index is 40.03 kg/m     DVT prophylaxis: R LE SCD Code Status: FULL CODE Family Communication: no family present at time of visit today Disposition Plan: tele bed   Consultants:  Dr. Sharol Given   Antimicrobials:  Zosyn 10/12 > Vanc 10/12 > Cefazolin 10/10 >  Objective: Blood pressure (!) 102/53, pulse 64, temperature 97.8 F (36.6 C), temperature source Oral, resp. rate 20, height 5\' 3"  (1.6 m), weight 98.9 kg (218 lb), SpO2 99 %.  Intake/Output Summary (Last 24 hours) at 06/03/17 1634 Last data filed at 06/03/17 1514  Gross per 24 hour  Intake  1405 ml  Output             2050 ml  Net             -645 ml   Filed Weights   06/02/17 0400 06/02/17 0421 06/03/17 0345  Weight: 99.3 kg (219 lb) 100.2 kg (221 lb) 98.9 kg (218 lb)    Examination: General: alert and pleasant  Lungs: CTA B Cardiovascular: irregularly irregular Abdomen: NT/ND, soft, BS+ Extremities: left lower extremity in extensive dressing - R LE w/o edema   CBC:  Recent Labs Lab 05/29/2017 1816 05/31/17 0557  06/01/17 0408 06/02/17 0310 06/03/17 0526  WBC 24.3* 27.4* 21.5* 17.3* 11.6*  HGB 7.0* 6.7* 7.9* 8.5* 8.1*  HCT 22.6* 21.5* 25.1* 27.5* 27.3*  MCV 90.8 90.7 88.7 92.0 94.1  PLT 287 258 232 236 161   Basic Metabolic Panel:  Recent Labs Lab 05/29/17 0510 06/10/2017 0249 05/31/17 0557 06/01/17 0314 06/03/17 0526  NA 134* 130* 130* 132* 137  K 5.6* 5.4* 5.0 3.7 2.9*  CL 92* 88* 90* 90* 89*  CO2 36* 34* 33* 34* 40*  GLUCOSE 108* 90 95 96 121*  BUN 45* 51* 44* 38* 21*  CREATININE 1.21* 1.25* 1.08* 1.03* 0.92  CALCIUM 8.9 8.5* 8.4* 8.0* 8.3*  MG 2.1 2.1 2.2  --   --    GFR: Estimated Creatinine Clearance: 54.7 mL/min (by C-G formula based on SCr of 0.92 mg/dL).  Liver Function Tests:  Recent Labs Lab 06/03/17 0526  AST 32  ALT <5*  ALKPHOS 194*  BILITOT 0.6  PROT 4.4*  ALBUMIN 1.4*     Recent Results (from the past 240 hour(s))  Aerobic/Anaerobic Culture (surgical/deep wound)     Status: None   Collection Time: 05/24/2017 10:26 AM  Result Value Ref Range Status   Specimen Description TISSUE ABSCESS LEFT LEG  Final   Special Requests PATIENT ON FOLLOWING ANCEF 2G  Final   Gram Stain   Final    MODERATE WBC PRESENT, PREDOMINANTLY PMN ABUNDANT GRAM NEGATIVE RODS MODERATE GRAM POSITIVE COCCI    Culture   Final    ABUNDANT ESCHERICHIA COLI ABUNDANT PSEUDOMONAS AERUGINOSA ABUNDANT MORGANELLA MORGANII ABUNDANT PROTEUS MIRABILIS ABUNDANT PREVOTELLA BIVIA BETA LACTAMASE POSITIVE MODERATE VANCOMYCIN RESISTANT ENTEROCOCCUS    Report Status 06/02/2017 FINAL  Final   Organism ID, Bacteria ESCHERICHIA COLI  Final   Organism ID, Bacteria PSEUDOMONAS AERUGINOSA  Final   Organism ID, Bacteria MORGANELLA MORGANII  Final   Organism ID, Bacteria PROTEUS MIRABILIS  Final   Organism ID, Bacteria VANCOMYCIN RESISTANT ENTEROCOCCUS  Final      Susceptibility   Escherichia coli - MIC*    AMPICILLIN 4 SENSITIVE Sensitive     CEFAZOLIN <=4 SENSITIVE Sensitive     CEFEPIME <=1  SENSITIVE Sensitive     CEFTAZIDIME <=1 SENSITIVE Sensitive     CEFTRIAXONE <=1 SENSITIVE Sensitive     CIPROFLOXACIN <=0.25 SENSITIVE Sensitive     GENTAMICIN <=1 SENSITIVE Sensitive     IMIPENEM <=0.25 SENSITIVE Sensitive     TRIMETH/SULFA <=20 SENSITIVE Sensitive     AMPICILLIN/SULBACTAM <=2 SENSITIVE Sensitive     PIP/TAZO <=4 SENSITIVE Sensitive     Extended ESBL NEGATIVE Sensitive     * ABUNDANT ESCHERICHIA COLI   Morganella morganii - MIC*    AMPICILLIN >=32 RESISTANT Resistant     CEFAZOLIN >=64 RESISTANT Resistant     CEFEPIME <=1 SENSITIVE Sensitive     CEFTAZIDIME <=1 SENSITIVE Sensitive     CEFTRIAXONE <=1  SENSITIVE Sensitive     CIPROFLOXACIN <=0.25 SENSITIVE Sensitive     GENTAMICIN <=1 SENSITIVE Sensitive     IMIPENEM 4 SENSITIVE Sensitive     TRIMETH/SULFA <=20 SENSITIVE Sensitive     AMPICILLIN/SULBACTAM 16 INTERMEDIATE Intermediate     PIP/TAZO <=4 SENSITIVE Sensitive     * ABUNDANT MORGANELLA MORGANII   Pseudomonas aeruginosa - MIC*    CEFTAZIDIME <=1 SENSITIVE Sensitive     CIPROFLOXACIN <=0.25 SENSITIVE Sensitive     GENTAMICIN <=1 SENSITIVE Sensitive     IMIPENEM 2 SENSITIVE Sensitive     PIP/TAZO <=4 SENSITIVE Sensitive     CEFEPIME <=1 SENSITIVE Sensitive     * ABUNDANT PSEUDOMONAS AERUGINOSA   Proteus mirabilis - MIC*    AMPICILLIN <=2 SENSITIVE Sensitive     CEFAZOLIN 8 SENSITIVE Sensitive     CEFEPIME <=1 SENSITIVE Sensitive     CEFTAZIDIME <=1 SENSITIVE Sensitive     CEFTRIAXONE <=1 SENSITIVE Sensitive     CIPROFLOXACIN <=0.25 SENSITIVE Sensitive     GENTAMICIN <=1 SENSITIVE Sensitive     IMIPENEM 4 SENSITIVE Sensitive     TRIMETH/SULFA <=20 SENSITIVE Sensitive     AMPICILLIN/SULBACTAM <=2 SENSITIVE Sensitive     PIP/TAZO <=4 SENSITIVE Sensitive     * ABUNDANT PROTEUS MIRABILIS   Vancomycin resistant enterococcus - MIC*    AMPICILLIN >=32 RESISTANT Resistant     VANCOMYCIN >=32 RESISTANT Resistant     GENTAMICIN SYNERGY RESISTANT Resistant      LINEZOLID 2 SENSITIVE Sensitive     * MODERATE VANCOMYCIN RESISTANT ENTEROCOCCUS     Scheduled Meds: . ambrisentan  5 mg Oral Daily  . brimonidine  1 drop Right Eye BID  . brinzolamide  1 drop Both Eyes BID  . chlorhexidine  60 mL Topical Once  . docusate sodium  100 mg Oral BID  . furosemide  40 mg Intravenous Daily  . Gerhardt's butt cream   Topical BID  . latanoprost  1 drop Right Eye QHS  . midodrine  10 mg Oral TID WC  . sodium chloride flush  3 mL Intravenous Q12H  . timolol  1 drop Both Eyes BID     LOS: 15 days   Cherene Altes, MD [facility] Office  [phone] Pager - Text Page per Shea Evans as per below:  On-Call/Text Page:      [email or web address]      password [email or web address]  If 7PM-7AM, please contact night-coverage [email or web address] Password [email or web address] 06/03/2017, 4:34 PM

## 2017-06-03 NOTE — Progress Notes (Signed)
CCMD notified RN that patient's HR has been going down to 30s frequently but not sustaining. RN checked on patient and denies any symptoms. Patients HR has been ranging from low 50s to mid 60s. Will continue to monitor.

## 2017-06-04 ENCOUNTER — Inpatient Hospital Stay (HOSPITAL_COMMUNITY): Payer: Medicare Other | Admitting: Anesthesiology

## 2017-06-04 ENCOUNTER — Inpatient Hospital Stay (HOSPITAL_COMMUNITY): Payer: Medicare Other | Admitting: Certified Registered Nurse Anesthetist

## 2017-06-04 ENCOUNTER — Encounter (HOSPITAL_COMMUNITY): Admission: EM | Disposition: E | Payer: Self-pay | Source: Home / Self Care | Attending: Internal Medicine

## 2017-06-04 ENCOUNTER — Encounter (HOSPITAL_COMMUNITY): Payer: Self-pay | Admitting: Orthopedic Surgery

## 2017-06-04 ENCOUNTER — Inpatient Hospital Stay (HOSPITAL_COMMUNITY): Payer: Medicare Other

## 2017-06-04 DIAGNOSIS — D62 Acute posthemorrhagic anemia: Secondary | ICD-10-CM

## 2017-06-04 DIAGNOSIS — I509 Heart failure, unspecified: Secondary | ICD-10-CM

## 2017-06-04 DIAGNOSIS — J9600 Acute respiratory failure, unspecified whether with hypoxia or hypercapnia: Secondary | ICD-10-CM

## 2017-06-04 DIAGNOSIS — K264 Chronic or unspecified duodenal ulcer with hemorrhage: Secondary | ICD-10-CM | POA: Diagnosis not present

## 2017-06-04 DIAGNOSIS — J9601 Acute respiratory failure with hypoxia: Secondary | ICD-10-CM

## 2017-06-04 DIAGNOSIS — I130 Hypertensive heart and chronic kidney disease with heart failure and stage 1 through stage 4 chronic kidney disease, or unspecified chronic kidney disease: Secondary | ICD-10-CM | POA: Diagnosis not present

## 2017-06-04 DIAGNOSIS — R578 Other shock: Secondary | ICD-10-CM

## 2017-06-04 DIAGNOSIS — I272 Pulmonary hypertension, unspecified: Secondary | ICD-10-CM

## 2017-06-04 DIAGNOSIS — N183 Chronic kidney disease, stage 3 (moderate): Secondary | ICD-10-CM | POA: Diagnosis not present

## 2017-06-04 HISTORY — PX: LAPAROTOMY: SHX154

## 2017-06-04 HISTORY — PX: ESOPHAGOGASTRODUODENOSCOPY: SHX5428

## 2017-06-04 HISTORY — PX: I&D EXTREMITY: SHX5045

## 2017-06-04 LAB — POCT I-STAT 7, (LYTES, BLD GAS, ICA,H+H)
Acid-base deficit: 9 mmol/L — ABNORMAL HIGH (ref 0.0–2.0)
Acid-base deficit: 10 mmol/L — ABNORMAL HIGH (ref 0.0–2.0)
BICARBONATE: 17.4 mmol/L — AB (ref 20.0–28.0)
Bicarbonate: 27.4 mmol/L (ref 20.0–28.0)
Bicarbonate: 17.9 mmol/L — ABNORMAL LOW (ref 20.0–28.0)
CALCIUM ION: 1.03 mmol/L — AB (ref 1.15–1.40)
CALCIUM ION: 1.14 mmol/L — AB (ref 1.15–1.40)
Calcium, Ion: 1.04 mmol/L — ABNORMAL LOW (ref 1.15–1.40)
HCT: 33 % — ABNORMAL LOW (ref 36.0–46.0)
HEMATOCRIT: 21 % — AB (ref 36.0–46.0)
HEMATOCRIT: 26 % — AB (ref 36.0–46.0)
HEMOGLOBIN: 7.1 g/dL — AB (ref 12.0–15.0)
Hemoglobin: 8.8 g/dL — ABNORMAL LOW (ref 12.0–15.0)
Hemoglobin: 11.2 g/dL — ABNORMAL LOW (ref 12.0–15.0)
O2 SAT: 93 %
O2 Saturation: 100 %
O2 Saturation: 100 %
PCO2 ART: 38.6 mmHg (ref 32.0–48.0)
PCO2 ART: 39.5 mmHg (ref 32.0–48.0)
PO2 ART: 330 mmHg — AB (ref 83.0–108.0)
POTASSIUM: 5.8 mmol/L — AB (ref 3.5–5.1)
Patient temperature: 36.1
Patient temperature: 35.9
Potassium: 4.8 mmol/L (ref 3.5–5.1)
Potassium: 5 mmol/L (ref 3.5–5.1)
SODIUM: 142 mmol/L (ref 135–145)
SODIUM: 141 mmol/L (ref 135–145)
Sodium: 140 mmol/L (ref 135–145)
TCO2: 29 mmol/L (ref 22–32)
TCO2: 19 mmol/L — AB (ref 22–32)
TCO2: 19 mmol/L — AB (ref 22–32)
pCO2 arterial: 61.4 mmHg — ABNORMAL HIGH (ref 32.0–48.0)
pH, Arterial: 7.257 — ABNORMAL LOW (ref 7.350–7.450)
pH, Arterial: 7.269 — ABNORMAL LOW (ref 7.350–7.450)
pH, Arterial: 7.246 — ABNORMAL LOW (ref 7.350–7.450)
pO2, Arterial: 77 mmHg — ABNORMAL LOW (ref 83.0–108.0)
pO2, Arterial: 304 mmHg — ABNORMAL HIGH (ref 83.0–108.0)

## 2017-06-04 LAB — PREPARE RBC (CROSSMATCH)

## 2017-06-04 LAB — BLOOD GAS, ARTERIAL
ACID-BASE DEFICIT: 4.2 mmol/L — AB (ref 0.0–2.0)
Bicarbonate: 21.5 mmol/L (ref 20.0–28.0)
DRAWN BY: 41422
FIO2: 100
MECHVT: 350 mL
O2 SAT: 99.8 %
PEEP: 5 cmH2O
PH ART: 7.276 — AB (ref 7.350–7.450)
Patient temperature: 98.6
RATE: 20 resp/min
pCO2 arterial: 47.7 mmHg (ref 32.0–48.0)
pO2, Arterial: 380 mmHg — ABNORMAL HIGH (ref 83.0–108.0)

## 2017-06-04 LAB — BASIC METABOLIC PANEL
ANION GAP: 7 (ref 5–15)
ANION GAP: 9 (ref 5–15)
BUN: 21 mg/dL — ABNORMAL HIGH (ref 6–20)
BUN: 24 mg/dL — ABNORMAL HIGH (ref 6–20)
CALCIUM: 8.5 mg/dL — AB (ref 8.9–10.3)
CHLORIDE: 105 mmol/L (ref 101–111)
CO2: 38 mmol/L — ABNORMAL HIGH (ref 22–32)
CO2: 25 mmol/L (ref 22–32)
Calcium: 6.8 mg/dL — ABNORMAL LOW (ref 8.9–10.3)
Chloride: 92 mmol/L — ABNORMAL LOW (ref 101–111)
Creatinine, Ser: 0.76 mg/dL (ref 0.44–1.00)
Creatinine, Ser: 0.97 mg/dL (ref 0.44–1.00)
GFR calc Af Amer: >60 mL/min (ref >60)
GFR, EST NON AFRICAN AMERICAN: 54 mL/min — AB (ref >60)
GLUCOSE: 116 mg/dL — AB (ref 65–99)
Glucose, Bld: 88 mg/dL (ref 65–99)
POTASSIUM: 4.9 mmol/L (ref 3.5–5.1)
Potassium: 4.8 mmol/L (ref 3.5–5.1)
Sodium: 137 mmol/L (ref 135–145)
Sodium: 139 mmol/L (ref 135–145)

## 2017-06-04 LAB — POCT I-STAT, CHEM 8
BUN: 24 mg/dL — AB (ref 6–20)
CALCIUM ION: 0.92 mmol/L — AB (ref 1.15–1.40)
CREATININE: 0.9 mg/dL (ref 0.44–1.00)
Chloride: 105 mmol/L (ref 101–111)
GLUCOSE: 131 mg/dL — AB (ref 65–99)
HCT: 28 % — ABNORMAL LOW (ref 36.0–46.0)
Hemoglobin: 9.5 g/dL — ABNORMAL LOW (ref 12.0–15.0)
Potassium: 5 mmol/L (ref 3.5–5.1)
SODIUM: 140 mmol/L (ref 135–145)
TCO2: 24 mmol/L (ref 22–32)

## 2017-06-04 LAB — PROTIME-INR
INR: 1.44
Prothrombin Time: 17.4 seconds — ABNORMAL HIGH (ref 11.4–15.2)

## 2017-06-04 LAB — GLUCOSE, CAPILLARY: Glucose-Capillary: 70 mg/dL (ref 65–99)

## 2017-06-04 LAB — POCT I-STAT 4, (NA,K, GLUC, HGB,HCT)
GLUCOSE: 156 mg/dL — AB (ref 65–99)
HEMATOCRIT: 31 % — AB (ref 36.0–46.0)
HEMOGLOBIN: 10.5 g/dL — AB (ref 12.0–15.0)
Potassium: 5.6 mmol/L — ABNORMAL HIGH (ref 3.5–5.1)
Sodium: 140 mmol/L (ref 135–145)

## 2017-06-04 LAB — TROPONIN I: TROPONIN I: 0.04 ng/mL — AB (ref <0.03)

## 2017-06-04 LAB — CBC
HCT: 25.1 % — ABNORMAL LOW (ref 36.0–46.0)
HEMOGLOBIN: 8 g/dL — AB (ref 12.0–15.0)
MCH: 29.4 pg (ref 26.0–34.0)
MCHC: 31.9 g/dL (ref 30.0–36.0)
MCV: 92.3 fL (ref 78.0–100.0)
PLATELETS: 198 10*3/uL (ref 150–400)
RBC: 2.72 MIL/uL — AB (ref 3.87–5.11)
RDW: 16.1 % — ABNORMAL HIGH (ref 11.5–15.5)
WBC: 21.3 10*3/uL — AB (ref 4.0–10.5)

## 2017-06-04 LAB — MAGNESIUM: Magnesium: 2 mg/dL (ref 1.7–2.4)

## 2017-06-04 LAB — TYPE AND SCREEN
ABO/RH(D): B NEG
Antibody Screen: NEGATIVE

## 2017-06-04 LAB — FIBRINOGEN: FIBRINOGEN: 384 mg/dL (ref 210–475)

## 2017-06-04 SURGERY — LAPAROTOMY, EXPLORATORY
Anesthesia: General | Site: Abdomen

## 2017-06-04 SURGERY — IRRIGATION AND DEBRIDEMENT EXTREMITY
Anesthesia: General | Site: Leg Upper | Laterality: Left

## 2017-06-04 SURGERY — EGD (ESOPHAGOGASTRODUODENOSCOPY)
Anesthesia: Moderate Sedation

## 2017-06-04 MED ORDER — POLYETHYLENE GLYCOL 3350 17 G PO PACK: 17.0000 g | PACK | Freq: Every day | ORAL | Status: DC | PRN

## 2017-06-04 MED ORDER — MIDAZOLAM HCL 5 MG/ML IJ SOLN
INTRAMUSCULAR | Status: AC
  Filled 2017-06-04: qty 2

## 2017-06-04 MED ORDER — PROMETHAZINE HCL 25 MG/ML IJ SOLN: 6.2500 mg | INTRAMUSCULAR | Status: DC | PRN

## 2017-06-04 MED ORDER — SODIUM CHLORIDE 0.9 % IV SOLN
0.0000 ug/min | INTRAVENOUS | Status: DC
  Administered 2017-06-04: 20 ug/min via INTRAVENOUS
  Administered 2017-06-04: 400 ug/min via INTRAVENOUS
  Administered 2017-06-04: 50 ug/min via INTRAVENOUS
  Filled 2017-06-04 (×7): qty 1

## 2017-06-04 MED ORDER — CALCIUM CHLORIDE 10 % IV SOLN
INTRAVENOUS | Status: DC | PRN
  Administered 2017-06-04 (×4): .5 g via INTRAVENOUS

## 2017-06-04 MED ORDER — METHOCARBAMOL 1000 MG/10ML IJ SOLN: 500.0000 mg | Freq: Four times a day (QID) | INTRAVENOUS | Status: DC | PRN

## 2017-06-04 MED ORDER — PROPOFOL 10 MG/ML IV BOLUS
INTRAVENOUS | Status: DC | PRN
  Administered 2017-06-04 (×2): 60 mg via INTRAVENOUS

## 2017-06-04 MED ORDER — SODIUM CHLORIDE 0.9 % IV SOLN: Freq: Once | INTRAVENOUS | Status: DC

## 2017-06-04 MED ORDER — DEXAMETHASONE SODIUM PHOSPHATE 10 MG/ML IJ SOLN
INTRAMUSCULAR | Status: AC
  Filled 2017-06-04: qty 1

## 2017-06-04 MED ORDER — ONDANSETRON HCL 4 MG/2ML IJ SOLN: 4.0000 mg | Freq: Four times a day (QID) | INTRAMUSCULAR | Status: DC | PRN

## 2017-06-04 MED ORDER — OXYCODONE HCL 5 MG PO TABS: 5.0000 mg | ORAL_TABLET | ORAL | Status: DC | PRN

## 2017-06-04 MED ORDER — EPINEPHRINE PF 1 MG/ML IJ SOLN
0.5000 ug/min | INTRAVENOUS | Status: DC
  Administered 2017-06-05 (×2): 20 ug/min via INTRAVENOUS
  Filled 2017-06-04 (×4): qty 4

## 2017-06-04 MED ORDER — ACETAMINOPHEN 650 MG RE SUPP: 650.0000 mg | Freq: Four times a day (QID) | RECTAL | Status: DC | PRN

## 2017-06-04 MED ORDER — SODIUM CHLORIDE 0.9 % IV SOLN
0.0000 ug/min | INTRAVENOUS | Status: DC
  Filled 2017-06-04: qty 1

## 2017-06-04 MED ORDER — HYDROMORPHONE HCL 1 MG/ML IJ SOLN: 0.2500 mg | INTRAMUSCULAR | Status: DC | PRN

## 2017-06-04 MED ORDER — METHOCARBAMOL 500 MG PO TABS: 500.0000 mg | ORAL_TABLET | Freq: Four times a day (QID) | ORAL | Status: DC | PRN

## 2017-06-04 MED ORDER — ROCURONIUM BROMIDE 100 MG/10ML IV SOLN
INTRAVENOUS | Status: DC | PRN
  Administered 2017-06-04 (×2): 50 mg via INTRAVENOUS

## 2017-06-04 MED ORDER — LIDOCAINE 2% (20 MG/ML) 5 ML SYRINGE
INTRAMUSCULAR | Status: AC
  Filled 2017-06-04: qty 15

## 2017-06-04 MED ORDER — ACETAMINOPHEN 325 MG PO TABS: 650.0000 mg | ORAL_TABLET | Freq: Four times a day (QID) | ORAL | Status: DC | PRN

## 2017-06-04 MED ORDER — 0.9 % SODIUM CHLORIDE (POUR BTL) OPTIME
TOPICAL | Status: DC | PRN
  Administered 2017-06-04: 2000 mL
  Administered 2017-06-04: 1000 mL

## 2017-06-04 MED ORDER — EPINEPHRINE PF 1 MG/10ML IJ SOSY
PREFILLED_SYRINGE | INTRAMUSCULAR | Status: AC
Start: 2017-06-04 — End: 2017-06-04
  Filled 2017-06-04: qty 10

## 2017-06-04 MED ORDER — 0.9 % SODIUM CHLORIDE (POUR BTL) OPTIME
TOPICAL | Status: DC | PRN
  Administered 2017-06-04: 1000 mL

## 2017-06-04 MED ORDER — MAGNESIUM CITRATE PO SOLN: 1.0000 | Freq: Once | ORAL | Status: DC | PRN

## 2017-06-04 MED ORDER — VASOPRESSIN 20 UNIT/ML IV SOLN
0.0300 [IU]/min | INTRAVENOUS | Status: AC
  Administered 2017-06-04: 0.03 [IU]/min via INTRAVENOUS
  Filled 2017-06-04: qty 2

## 2017-06-04 MED ORDER — SODIUM CHLORIDE 0.9 % IV SOLN
80.0000 mg | Freq: Once | INTRAVENOUS | Status: AC
  Administered 2017-06-04: 18:00:00 80 mg via INTRAVENOUS
  Filled 2017-06-04: qty 80

## 2017-06-04 MED ORDER — LACTATED RINGERS IV SOLN
INTRAVENOUS | Status: DC
  Administered 2017-06-04: 10:00:00 via INTRAVENOUS

## 2017-06-04 MED ORDER — BISACODYL 10 MG RE SUPP: 10.0000 mg | Freq: Every day | RECTAL | Status: DC | PRN

## 2017-06-04 MED ORDER — ROCURONIUM BROMIDE 50 MG/5ML IV SOLN
INTRAVENOUS | Status: AC
  Filled 2017-06-04: qty 3

## 2017-06-04 MED ORDER — DEXAMETHASONE SODIUM PHOSPHATE 10 MG/ML IJ SOLN
INTRAMUSCULAR | Status: DC | PRN
  Administered 2017-06-04: 5 mg via INTRAVENOUS

## 2017-06-04 MED ORDER — OXYCODONE HCL 5 MG/5ML PO SOLN: 5.0000 mg | Freq: Once | ORAL | Status: DC | PRN

## 2017-06-04 MED ORDER — VASOPRESSIN 20 UNIT/ML IV SOLN
0.0400 [IU]/min | INTRAVENOUS | Status: DC
  Administered 2017-06-05 (×2): 0.04 [IU]/min via INTRAVENOUS
  Filled 2017-06-04 (×3): qty 2

## 2017-06-04 MED ORDER — DEXTROSE 5 % IV SOLN
INTRAVENOUS | Status: DC | PRN
  Administered 2017-06-04: .04 [IU]/min via INTRAVENOUS

## 2017-06-04 MED ORDER — EPINEPHRINE PF 1 MG/ML IJ SOLN
0.5000 ug/min | INTRAVENOUS | Status: AC
  Administered 2017-06-04: 10 ug/min via INTRAVENOUS
  Filled 2017-06-04: qty 4

## 2017-06-04 MED ORDER — ONDANSETRON HCL 4 MG/2ML IJ SOLN
INTRAMUSCULAR | Status: AC
Start: 2017-06-04 — End: 2017-06-04
  Filled 2017-06-04: qty 2

## 2017-06-04 MED ORDER — METOCLOPRAMIDE HCL 10 MG PO TABS: 5.0000 mg | ORAL_TABLET | Freq: Three times a day (TID) | ORAL | Status: DC | PRN

## 2017-06-04 MED ORDER — PHENYLEPHRINE HCL 10 MG/ML IJ SOLN
INTRAMUSCULAR | Status: DC | PRN
  Administered 2017-06-04 (×2): 40 ug via INTRAVENOUS

## 2017-06-04 MED ORDER — FENTANYL CITRATE (PF) 250 MCG/5ML IJ SOLN
INTRAMUSCULAR | Status: AC
  Filled 2017-06-04: qty 5

## 2017-06-04 MED ORDER — ONDANSETRON HCL 4 MG PO TABS: 4.0000 mg | ORAL_TABLET | Freq: Four times a day (QID) | ORAL | Status: DC | PRN

## 2017-06-04 MED ORDER — PHENYLEPHRINE 40 MCG/ML (10ML) SYRINGE FOR IV PUSH (FOR BLOOD PRESSURE SUPPORT)
PREFILLED_SYRINGE | INTRAVENOUS | Status: DC | PRN
  Administered 2017-06-04: 120 ug via INTRAVENOUS
  Administered 2017-06-04: 80 ug via INTRAVENOUS

## 2017-06-04 MED ORDER — NOREPINEPHRINE BITARTRATE 1 MG/ML IV SOLN
0.0000 ug/min | INTRAVENOUS | Status: DC
  Administered 2017-06-04: 40 ug/min via INTRAVENOUS
  Filled 2017-06-04 (×7): qty 4

## 2017-06-04 MED ORDER — MIDAZOLAM HCL 2 MG/2ML IJ SOLN
INTRAMUSCULAR | Status: AC
  Filled 2017-06-04: qty 2

## 2017-06-04 MED ORDER — DOCUSATE SODIUM 100 MG PO CAPS: 100.0000 mg | ORAL_CAPSULE | Freq: Two times a day (BID) | ORAL | Status: DC

## 2017-06-04 MED ORDER — CEFAZOLIN SODIUM-DEXTROSE 2-3 GM-%(50ML) IV SOLR
INTRAVENOUS | Status: DC | PRN
  Administered 2017-06-04: 2 g via INTRAVENOUS

## 2017-06-04 MED ORDER — SODIUM CHLORIDE 0.9 % IV SOLN
INTRAVENOUS | Status: DC
  Administered 2017-06-04: 18:00:00 via INTRAVENOUS

## 2017-06-04 MED ORDER — LIDOCAINE 2% (20 MG/ML) 5 ML SYRINGE
INTRAMUSCULAR | Status: AC
  Filled 2017-06-04: qty 5

## 2017-06-04 MED ORDER — SODIUM CHLORIDE 0.9 % IR SOLN
Status: DC | PRN
  Administered 2017-06-04: 3000 mL

## 2017-06-04 MED ORDER — MIDAZOLAM HCL 2 MG/2ML IJ SOLN
INTRAMUSCULAR | Status: DC | PRN
  Administered 2017-06-04: 2 mg via INTRAVENOUS

## 2017-06-04 MED ORDER — EPINEPHRINE PF 1 MG/10ML IJ SOSY
PREFILLED_SYRINGE | INTRAMUSCULAR | Status: DC | PRN
  Administered 2017-06-04: 0.1 mg via INTRAVENOUS

## 2017-06-04 MED ORDER — SODIUM BICARBONATE 8.4 % IV SOLN
INTRAVENOUS | Status: DC | PRN
  Administered 2017-06-04 (×2): 50 meq via INTRAVENOUS

## 2017-06-04 MED ORDER — HYDROMORPHONE HCL 1 MG/ML IJ SOLN: 1.0000 mg | INTRAMUSCULAR | Status: DC | PRN

## 2017-06-04 MED ORDER — METOCLOPRAMIDE HCL 5 MG/ML IJ SOLN
5.0000 mg | Freq: Three times a day (TID) | INTRAMUSCULAR | Status: DC | PRN
  Filled 2017-06-04: qty 2

## 2017-06-04 MED ORDER — MEPERIDINE HCL 25 MG/ML IJ SOLN: 6.2500 mg | INTRAMUSCULAR | Status: DC | PRN

## 2017-06-04 MED ORDER — FENTANYL CITRATE (PF) 100 MCG/2ML IJ SOLN
INTRAMUSCULAR | Status: DC | PRN
  Administered 2017-06-04: 25 ug via INTRAVENOUS
  Administered 2017-06-04 (×2): 12.5 ug via INTRAVENOUS

## 2017-06-04 MED ORDER — ONDANSETRON HCL 4 MG/2ML IJ SOLN
INTRAMUSCULAR | Status: DC | PRN
  Administered 2017-06-04: 4 mg via INTRAVENOUS

## 2017-06-04 MED ORDER — MIDAZOLAM HCL 10 MG/2ML IJ SOLN
INTRAMUSCULAR | Status: DC | PRN
  Administered 2017-06-04: 1 mg via INTRAVENOUS
  Administered 2017-06-04: 2 mg via INTRAVENOUS

## 2017-06-04 MED ORDER — SODIUM CHLORIDE 0.9 % IV SOLN
8.0000 mg/h | INTRAVENOUS | Status: AC
  Administered 2017-06-04 – 2017-06-07 (×7): 8 mg/h via INTRAVENOUS
  Filled 2017-06-04 (×12): qty 80

## 2017-06-04 MED ORDER — SODIUM CHLORIDE 0.9 % IJ SOLN
PREFILLED_SYRINGE | INTRAMUSCULAR | Status: DC | PRN
  Administered 2017-06-04: 3 mL

## 2017-06-04 MED ORDER — FENTANYL CITRATE (PF) 100 MCG/2ML IJ SOLN
INTRAMUSCULAR | Status: DC | PRN
  Administered 2017-06-04 (×4): 25 ug via INTRAVENOUS

## 2017-06-04 MED ORDER — ALBUMIN HUMAN 5 % IV SOLN: 12.5000 g | Freq: Once | INTRAVENOUS | Status: DC

## 2017-06-04 MED ORDER — OXYCODONE HCL 5 MG PO TABS: 5.0000 mg | ORAL_TABLET | Freq: Once | ORAL | Status: DC | PRN

## 2017-06-04 MED ORDER — INSULIN ASPART 100 UNIT/ML IV SOLN
10.0000 [IU] | Freq: Once | INTRAVENOUS | Status: DC
  Filled 2017-06-04: qty 0.1

## 2017-06-04 MED ORDER — ALBUMIN HUMAN 25 % IV SOLN
12.5000 g | Freq: Once | INTRAVENOUS | Status: AC
  Administered 2017-06-04: 12.5 g via INTRAVENOUS
  Filled 2017-06-04: qty 50

## 2017-06-04 MED ORDER — PHENYLEPHRINE 40 MCG/ML (10ML) SYRINGE FOR IV PUSH (FOR BLOOD PRESSURE SUPPORT)
PREFILLED_SYRINGE | INTRAVENOUS | Status: AC
  Filled 2017-06-04: qty 20

## 2017-06-04 MED ORDER — EPHEDRINE 5 MG/ML INJ
INTRAVENOUS | Status: AC
  Filled 2017-06-04: qty 20

## 2017-06-04 MED ORDER — PROPOFOL 10 MG/ML IV BOLUS
INTRAVENOUS | Status: AC
  Filled 2017-06-04: qty 20

## 2017-06-04 MED ORDER — FENTANYL CITRATE (PF) 100 MCG/2ML IJ SOLN
INTRAMUSCULAR | Status: AC
  Filled 2017-06-04: qty 2

## 2017-06-04 MED ORDER — FENTANYL CITRATE (PF) 100 MCG/2ML IJ SOLN
INTRAMUSCULAR | Status: AC
  Administered 2017-06-04: 21:00:00
  Filled 2017-06-04: qty 2

## 2017-06-04 MED ORDER — ALBUMIN HUMAN 5 % IV SOLN
INTRAVENOUS | Status: AC
  Filled 2017-06-04: qty 250

## 2017-06-04 MED ORDER — LIDOCAINE 2% (20 MG/ML) 5 ML SYRINGE
INTRAMUSCULAR | Status: DC | PRN
  Administered 2017-06-04: 100 mg via INTRAVENOUS

## 2017-06-04 MED ORDER — BUTAMBEN-TETRACAINE-BENZOCAINE 2-2-14 % EX AERO
INHALATION_SPRAY | CUTANEOUS | Status: DC | PRN
  Administered 2017-06-04: 2 via TOPICAL

## 2017-06-04 SURGICAL SUPPLY — 38 items
BLADE SURG 21 STRL SS (BLADE) ×3 IMPLANT
BNDG COHESIVE 6X5 TAN STRL LF (GAUZE/BANDAGES/DRESSINGS) ×3 IMPLANT
BNDG GAUZE ELAST 4 BULKY (GAUZE/BANDAGES/DRESSINGS) IMPLANT
CONNECTOR Y ATS VAC SYSTEM (MISCELLANEOUS) ×3 IMPLANT
COVER SURGICAL LIGHT HANDLE (MISCELLANEOUS) ×3 IMPLANT
DRAPE U-SHAPE 47X51 STRL (DRAPES) ×3 IMPLANT
DRESSING VERAFLO CLEANSE CC (GAUZE/BANDAGES/DRESSINGS) ×3 IMPLANT
DRSG ADAPTIC 3X8 NADH LF (GAUZE/BANDAGES/DRESSINGS) IMPLANT
DRSG VAC ATS LRG SENSATRAC (GAUZE/BANDAGES/DRESSINGS) IMPLANT
DRSG VERAFLO CLEANSE CC (GAUZE/BANDAGES/DRESSINGS) ×9
DURAPREP 26ML APPLICATOR (WOUND CARE) IMPLANT
ELECT REM PT RETURN 9FT ADLT (ELECTROSURGICAL)
ELECTRODE REM PT RTRN 9FT ADLT (ELECTROSURGICAL) IMPLANT
GAUZE SPONGE 4X4 12PLY STRL (GAUZE/BANDAGES/DRESSINGS) IMPLANT
GLOVE BIOGEL PI IND STRL 7.5 (GLOVE) ×1 IMPLANT
GLOVE BIOGEL PI IND STRL 9 (GLOVE) ×1 IMPLANT
GLOVE BIOGEL PI INDICATOR 7.5 (GLOVE) ×2
GLOVE BIOGEL PI INDICATOR 9 (GLOVE) ×2
GLOVE SURG ORTHO 9.0 STRL STRW (GLOVE) ×3 IMPLANT
GOWN STRL REUS W/ TWL LRG LVL3 (GOWN DISPOSABLE) ×2 IMPLANT
GOWN STRL REUS W/ TWL XL LVL3 (GOWN DISPOSABLE) ×2 IMPLANT
GOWN STRL REUS W/TWL LRG LVL3 (GOWN DISPOSABLE) ×6
GOWN STRL REUS W/TWL XL LVL3 (GOWN DISPOSABLE) ×6
HANDPIECE INTERPULSE COAX TIP (DISPOSABLE) ×3
KIT BASIN OR (CUSTOM PROCEDURE TRAY) ×3 IMPLANT
KIT ROOM TURNOVER OR (KITS) ×3 IMPLANT
MANIFOLD NEPTUNE II (INSTRUMENTS) ×3 IMPLANT
NS IRRIG 1000ML POUR BTL (IV SOLUTION) ×3 IMPLANT
PACK ORTHO EXTREMITY (CUSTOM PROCEDURE TRAY) ×3 IMPLANT
PAD ARMBOARD 7.5X6 YLW CONV (MISCELLANEOUS) ×3 IMPLANT
SET HNDPC FAN SPRY TIP SCT (DISPOSABLE) ×1 IMPLANT
STOCKINETTE IMPERVIOUS 9X36 MD (GAUZE/BANDAGES/DRESSINGS) ×3 IMPLANT
SWAB COLLECTION DEVICE MRSA (MISCELLANEOUS) IMPLANT
SWAB CULTURE ESWAB REG 1ML (MISCELLANEOUS) IMPLANT
TOWEL OR 17X26 10 PK STRL BLUE (TOWEL DISPOSABLE) ×3 IMPLANT
TUBE CONNECTING 12'X1/4 (SUCTIONS) ×1
TUBE CONNECTING 12X1/4 (SUCTIONS) ×2 IMPLANT
YANKAUER SUCT BULB TIP NO VENT (SUCTIONS) ×3 IMPLANT

## 2017-06-04 SURGICAL SUPPLY — 47 items
BLADE CLIPPER SURG (BLADE) IMPLANT
BNDG GAUZE ELAST 4 BULKY (GAUZE/BANDAGES/DRESSINGS) ×3 IMPLANT
CANISTER SUCT 3000ML PPV (MISCELLANEOUS) ×3 IMPLANT
CHLORAPREP W/TINT 26ML (MISCELLANEOUS) ×3 IMPLANT
COVER SURGICAL LIGHT HANDLE (MISCELLANEOUS) ×3 IMPLANT
DRAIN CHANNEL 19F RND (DRAIN) ×3 IMPLANT
DRAPE LAPAROSCOPIC ABDOMINAL (DRAPES) ×3 IMPLANT
DRAPE WARM FLUID 44X44 (DRAPE) ×3 IMPLANT
DRSG OPSITE POSTOP 4X10 (GAUZE/BANDAGES/DRESSINGS) IMPLANT
DRSG OPSITE POSTOP 4X8 (GAUZE/BANDAGES/DRESSINGS) IMPLANT
ELECT BLADE 6.5 EXT (BLADE) IMPLANT
ELECT CAUTERY BLADE 6.4 (BLADE) ×3 IMPLANT
ELECT REM PT RETURN 9FT ADLT (ELECTROSURGICAL) ×3
ELECTRODE REM PT RTRN 9FT ADLT (ELECTROSURGICAL) ×1 IMPLANT
EVACUATOR SILICONE 100CC (DRAIN) ×3 IMPLANT
GAUZE SPONGE 4X4 12PLY STRL (GAUZE/BANDAGES/DRESSINGS) ×3 IMPLANT
GLOVE BIO SURGEON STRL SZ8 (GLOVE) ×3 IMPLANT
GLOVE BIOGEL PI IND STRL 8 (GLOVE) ×1 IMPLANT
GLOVE BIOGEL PI INDICATOR 8 (GLOVE) ×2
GOWN STRL REUS W/ TWL LRG LVL3 (GOWN DISPOSABLE) ×1 IMPLANT
GOWN STRL REUS W/ TWL XL LVL3 (GOWN DISPOSABLE) ×1 IMPLANT
GOWN STRL REUS W/TWL LRG LVL3 (GOWN DISPOSABLE) ×3
GOWN STRL REUS W/TWL XL LVL3 (GOWN DISPOSABLE) ×3
KIT BASIN OR (CUSTOM PROCEDURE TRAY) ×3 IMPLANT
KIT ROOM TURNOVER OR (KITS) ×3 IMPLANT
LIGASURE IMPACT 36 18CM CVD LR (INSTRUMENTS) IMPLANT
NS IRRIG 1000ML POUR BTL (IV SOLUTION) ×6 IMPLANT
PACK GENERAL/GYN (CUSTOM PROCEDURE TRAY) ×3 IMPLANT
PAD ABD 8X10 STRL (GAUZE/BANDAGES/DRESSINGS) ×3 IMPLANT
PAD ARMBOARD 7.5X6 YLW CONV (MISCELLANEOUS) ×3 IMPLANT
SPECIMEN JAR LARGE (MISCELLANEOUS) IMPLANT
SPONGE LAP 18X18 X RAY DECT (DISPOSABLE) ×18 IMPLANT
STAPLER VISISTAT 35W (STAPLE) ×3 IMPLANT
SUCTION POOLE TIP (SUCTIONS) ×3 IMPLANT
SUT ETHILON 2 0 FS 18 (SUTURE) ×3 IMPLANT
SUT PDS AB 1 TP1 96 (SUTURE) ×6 IMPLANT
SUT SILK 2 0 SH CR/8 (SUTURE) ×3 IMPLANT
SUT VIC AB 2-0 SH 18 (SUTURE) ×12 IMPLANT
SUT VIC AB 3-0 SH 18 (SUTURE) ×3 IMPLANT
SUT VIC AB 3-0 SH 8-18 (SUTURE) ×3 IMPLANT
SUT VICRYL AB 2 0 TIES (SUTURE) ×3 IMPLANT
SUT VICRYL AB 3 0 TIES (SUTURE) ×3 IMPLANT
TAPE CLOTH SURG 6X10 WHT LF (GAUZE/BANDAGES/DRESSINGS) ×3 IMPLANT
TOWEL OR 17X24 6PK STRL BLUE (TOWEL DISPOSABLE) ×3 IMPLANT
TOWEL OR 17X26 10 PK STRL BLUE (TOWEL DISPOSABLE) ×3 IMPLANT
TRAY FOLEY W/METER SILVER 16FR (SET/KITS/TRAYS/PACK) IMPLANT
YANKAUER SUCT BULB TIP NO VENT (SUCTIONS) IMPLANT

## 2017-06-04 NOTE — Progress Notes (Signed)
PROGRESS NOTE    [name]  JKK:[id number] DOB: [birth year] DOA: 06/13/2017 PCP: Redmond School, MD   Brief Narrative:  82 y.o. WF PMHx MI, Chronic Atrial Fibrillation, Chronic Diastolic CHF, Pulmonary HTN, severe Tricuspid Valve Regurgitation, chronic respiratory failure) on 2.5 L O2 at night) , CKD stage III   recent left traumatic left leg hematoma complicated by hypovolemic shock and acute blood loss anemia  Presenting with acute on chronic diastolic CHF exacerbation as well as hypertension and elevated troponin. Patient noted to have been recently admitted September 22 to September 28 for left leg traumatic hematoma with noted hypovolemic shock and acute blood loss anemia. Patient was noted to have been transferred to a rehabilitation facility after hospitalization. Per report, patient with progressive weakness since being placed within the rehabilitation facility. No reported worsening of leg hematoma though patient has had progressive edema. No reported fevers or chills. No chest pain. No reported orthopnea or PND. Has been compliant with medication regimen. No reported high salt intake.  ED Course: Presented to the ER afebrile with noted systolic blood pressures in 90s to 100s. Creatinine 1.14, potassium 5.3. Troponin of 0.06. BNP is 748. Chest x-ray with cardiomegaly and central vascular congestion mild hazy bibasilar basilar atelectasis versus infiltrates.  States she was walking with cane in the house grabbed hold of a doorknob as she was moving to another room and believes she tripped. Unsure of what she struck when she fell negative LOC. States uses a cane and a Rollator at home for ambulation.  Subjective: 10/17 patient has been downstairs in surgical suite/PACU all day. Have attempted to evaluate patient on 2 occasions. Patient still on the care of the surgical team.         Assessment & Plan:   Active Problems:   Acute on chronic diastolic CHF (congestive heart  failure) (HCC)   Hypotension   Elevated troponin   Atherosclerosis of native arteries of extremities with gangrene, left leg (HCC)   Traumatic hematoma of left lower leg   Necrotizing fasciitis of lower leg (HCC)   Pressure injury of skin   LEFT LEG Hematoma -Hold Eliquis since last admission: NOTE Eliquis reversed on 05/11/2017 -Patient with large hematoma several areas of skin breakdown with eschar formation see picture above. -Foot warm, palpable DP/P -10/11 continue care per Dr. Meridee Score Orthopedic surgery. Patient scheduled to go to or on 10/12 for further debridement. At some point in future will require reconstruction/skin graft  -10/17 patient has been down stairs under the care of surgical team all day. Currently still under the care of the surgical team no charge        DVT prophylaxis: None secondary to large hematoma Code Status: Full Family Communication: Husband and daughter bedside for discussion of plan of care Disposition Plan: TBD   Consultants:  Dr. Jean Rosenthal Orthopedic surgery previous admission 9/22-9/28  Dr. Meridee Score orthopedic surgery     Procedures/Significant Events:  10/3 Echocardiogram: LVEF= 60% to 65%.- Indeterminant diastolic function (atrial fibrillation).  - Ventricular septum: D-shaped interventricular septum suggesting  RV pressure/volume overload. - Aortic valve: There was no stenosis. - Mitral valve: MR was at least moderate but was not fully visualized. - Left atrium: moderately to severely dilated. - Right ventricle:  moderately dilated. - Right atrium:  severely dilated. - Tricuspid valve:  moderate regurgitation.  - Pulmonary arteries: PA peak pressure: 59 mm Hg (S). - Systemic veins: IVC measured 2.6 cm with < 50% respirophasic variation, suggesting RA  pressure 15 mmHg. 10/3 transfuse 1 unit PRBC 10/5 LEFT leg I&D:Excision skin soft tissue fat and fascia of an area 39 x 20 cm medial aspect left thigh and left leg 5  cm deep wound VAC placed 10/10 LEFT leg repeat I&D:Using a 21 blade knife Ronjair and a Cobb elevator this was further debrided back. The wound that encompassed the thigh and calf measures 37 cm in length and 32 cm in width. This was irrigated with pulsatile lavage there was good healthy bleeding tissue the muscle was contractile. After irrigation and debridement the wound VAC was applied with the reticulating foam 10/8 transfuse 2 units PRBC 10/11 transfused 1 unit PRBC 10/17 LLE repeat I&D  I have personally reviewed and interpreted all radiology studies and my findings are as above.  VENTILATOR SETTINGS:    Cultures None  Antimicrobials: Anti-infectives    Start     Dose/Rate Route Frequency Ordered Stop   06/11/2017 0930  ceFAZolin (ANCEF) IVPB 2g/100 mL premix     2 g 200 mL/hr over 30 Minutes Intravenous On call to O.R. 06/03/17 1214 06/05/17 0559   06/02/17 2200  linezolid (ZYVOX) IVPB 600 mg    Comments:  Pharmacy may adjust dose prn   600 mg 300 mL/hr over 60 Minutes Intravenous Every 12 hours 06/02/17 1546     06/02/17 1545  linezolid (ZYVOX) IVPB 600 mg  Status:  Discontinued     600 mg 300 mL/hr over 60 Minutes Intravenous Every 12 hours 06/02/17 1543 06/02/17 1546   05/31/17 1500  vancomycin (VANCOCIN) 1,250 mg in sodium chloride 0.9 % 250 mL IVPB  Status:  Discontinued     1,250 mg 166.7 mL/hr over 90 Minutes Intravenous Every 24 hours 06/06/2017 1409 06/03/17 0845   06/12/2017 2000  piperacillin-tazobactam (ZOSYN) IVPB 3.375 g     3.375 g 12.5 mL/hr over 240 Minutes Intravenous Every 8 hours 05/22/2017 1409     06/09/2017 1300  vancomycin (VANCOCIN) 2,000 mg in sodium chloride 0.9 % 500 mL IVPB     2,000 mg 250 mL/hr over 120 Minutes Intravenous  Once 06/12/2017 1208 06/02/2017 1657   05/21/2017 1300  piperacillin-tazobactam (ZOSYN) IVPB 3.375 g     3.375 g 100 mL/hr over 30 Minutes Intravenous  Once 06/11/2017 1208 05/28/2017 1334   05/23/2017 0745  ceFAZolin (ANCEF) IVPB 2g/100 mL  premix  Status:  Discontinued     2 g 200 mL/hr over 30 Minutes Intravenous On call to O.R. 06/01/2017 0736 05/25/2017 1143   05/24/2017 1800  ceFAZolin (ANCEF) IVPB 2g/100 mL premix  Status:  Discontinued     2 g 200 mL/hr over 30 Minutes Intravenous Every 8 hours 06/16/2017 1220 06/10/2017 1210   05/29/2017 0945  ceFAZolin (ANCEF) IVPB 2g/100 mL premix     2 g 200 mL/hr over 30 Minutes Intravenous To Surgery 05/27/17 2001 06/02/2017 1015   06/11/2017 2000  ceFAZolin (ANCEF) IVPB 2g/100 mL premix     2 g 200 mL/hr over 30 Minutes Intravenous Every 6 hours 06/15/2017 1643 05/24/17 1009   05/27/2017 1015  ceFAZolin (ANCEF) IVPB 2g/100 mL premix     2 g 200 mL/hr over 30 Minutes Intravenous On call to O.R. 05/28/2017 1014 05/22/2017 1400       Devices    LINES / TUBES:      Continuous Infusions: . sodium chloride    .  ceFAZolin (ANCEF) IV    . linezolid (ZYVOX) IV Stopped (06/03/17 2306)  . methocarbamol (ROBAXIN)  IV    .  piperacillin-tazobactam (ZOSYN)  IV Stopped (05/29/2017 0813)     Objective: Vitals:   06/03/17 2044 06/01/2017 0447 06/11/2017 0700 06/01/2017 0800  BP: (!) 141/65 (!) 102/49  (!) 128/109  Pulse: 74 69    Resp: 18 (!) 21 (!) 21 17  Temp: 97.9 F (36.6 C) 98.5 F (36.9 C) 98.2 F (36.8 C)   TempSrc: Oral Oral Oral   SpO2: 97% 96%    Weight:      Height:        Intake/Output Summary (Last 24 hours) at 06/16/2017 0844 Last data filed at 06/13/2017 0800  Gross per 24 hour  Intake             1115 ml  Output             2125 ml  Net            -1010 ml   Filed Weights   06/02/17 0400 06/02/17 0421 06/03/17 0345  Weight: 219 lb (99.3 kg) 221 lb (100.2 kg) 218 lb (98.9 kg)    General:  unable to evaluate secondary to patient being under the care of the surgical team. Currently in surgery/PACU.No charge    Data Reviewed: Care during the described time interval was provided by me .  I have reviewed this patient's available data, including medical history, events of note,  physical examination, and all test results as part of my evaluation.   CBC:  Recent Labs Lab 05/27/2017 1816 05/31/17 0557 06/01/17 0408 06/02/17 0310 06/03/17 0526  WBC 24.3* 27.4* 21.5* 17.3* 11.6*  HGB 7.0* 6.7* 7.9* 8.5* 8.1*  HCT 22.6* 21.5* 25.1* 27.5* 27.3*  MCV 90.8 90.7 88.7 92.0 94.1  PLT 287 258 232 236 768   Basic Metabolic Panel:  Recent Labs Lab 05/29/17 0510 05/31/2017 0249 05/31/17 0557 06/01/17 0314 06/03/17 0526 05/23/2017 0356  NA 134* 130* 130* 132* 137 137  K 5.6* 5.4* 5.0 3.7 2.9* 4.8  CL 92* 88* 90* 90* 89* 92*  CO2 36* 34* 33* 34* 40* 38*  GLUCOSE 108* 90 95 96 121* 88  BUN 45* 51* 44* 38* 21* 21*  CREATININE 1.21* 1.25* 1.08* 1.03* 0.92 0.76  CALCIUM 8.9 8.5* 8.4* 8.0* 8.3* 8.5*  MG 2.1 2.1 2.2  --   --  2.0   GFR: Estimated Creatinine Clearance: 62.9 mL/min (by C-G formula based on SCr of 0.76 mg/dL). Liver Function Tests:  Recent Labs Lab 06/03/17 0526  AST 32  ALT <5*  ALKPHOS 194*  BILITOT 0.6  PROT 4.4*  ALBUMIN 1.4*   No results for input(s): LIPASE, AMYLASE in the last 168 hours. No results for input(s): AMMONIA in the last 168 hours. Coagulation Profile: No results for input(s): INR, PROTIME in the last 168 hours. Cardiac Enzymes: No results for input(s): CKTOTAL, CKMB, CKMBINDEX, TROPONINI in the last 168 hours. BNP (last 3 results) No results for input(s): PROBNP in the last 8760 hours. HbA1C: No results for input(s): HGBA1C in the last 72 hours. CBG: No results for input(s): GLUCAP in the last 168 hours. Lipid Profile: No results for input(s): CHOL, HDL, LDLCALC, TRIG, CHOLHDL, LDLDIRECT in the last 72 hours. Thyroid Function Tests: No results for input(s): TSH, T4TOTAL, FREET4, T3FREE, THYROIDAB in the last 72 hours. Anemia Panel: No results for input(s): VITAMINB12, FOLATE, FERRITIN, TIBC, IRON, RETICCTPCT in the last 72 hours. Urine analysis:    Component Value Date/Time   COLORURINE YELLOW 02/10/2016 1503    APPEARANCEUR CLOUDY (A) 02/10/2016 1503  LABSPEC 1.014 02/10/2016 1503   PHURINE 5.5 02/10/2016 1503   GLUCOSEU NEGATIVE 02/10/2016 1503   [place] 02/10/2016 [place] 02/10/2016 1503   [place] 02/10/2016 1503   PROTEINUR NEGATIVE 02/10/2016 1503   NITRITE NEGATIVE 02/10/2016 1503   [place] 02/10/2016 1503   Sepsis Labs: @LABRCNTIP (procalcitonin:4,lacticidven:4)  ) Recent Results (from the past 240 hour(s))  Aerobic/Anaerobic Culture (surgical/deep wound)     Status: None   Collection Time: 06/18/2017 10:26 AM  Result Value Ref Range Status   Specimen Description TISSUE ABSCESS LEFT LEG  Final   Special Requests PATIENT ON FOLLOWING ANCEF 2G  Final   Gram Stain   Final    MODERATE WBC PRESENT, PREDOMINANTLY PMN ABUNDANT GRAM NEGATIVE RODS MODERATE GRAM POSITIVE COCCI    Culture   Final    ABUNDANT ESCHERICHIA COLI ABUNDANT PSEUDOMONAS AERUGINOSA ABUNDANT [place] MORGANII ABUNDANT PROTEUS MIRABILIS ABUNDANT PREVOTELLA BIVIA BETA LACTAMASE POSITIVE MODERATE VANCOMYCIN RESISTANT ENTEROCOCCUS    Report Status 06/02/2017 FINAL  Final   Organism ID, Bacteria ESCHERICHIA COLI  Final   Organism ID, Bacteria PSEUDOMONAS AERUGINOSA  Final   Organism ID, Bacteria MORGANELLA MORGANII  Final   Organism ID, Bacteria PROTEUS MIRABILIS  Final   Organism ID, Bacteria VANCOMYCIN RESISTANT ENTEROCOCCUS  Final      Susceptibility   Escherichia coli - MIC*    AMPICILLIN 4 SENSITIVE Sensitive     CEFAZOLIN <=4 SENSITIVE Sensitive     CEFEPIME <=1 SENSITIVE Sensitive     CEFTAZIDIME <=1 SENSITIVE Sensitive     CEFTRIAXONE <=1 SENSITIVE Sensitive     CIPROFLOXACIN <=0.25 SENSITIVE Sensitive     GENTAMICIN <=1 SENSITIVE Sensitive     IMIPENEM <=0.25 SENSITIVE Sensitive     TRIMETH/SULFA <=20 SENSITIVE Sensitive     AMPICILLIN/SULBACTAM <=2 SENSITIVE Sensitive     PIP/TAZO <=4 SENSITIVE Sensitive     Extended ESBL NEGATIVE Sensitive      * ABUNDANT ESCHERICHIA COLI   Morganella morganii - MIC*    AMPICILLIN >=32 RESISTANT Resistant     CEFAZOLIN >=64 RESISTANT Resistant     CEFEPIME <=1 SENSITIVE Sensitive     CEFTAZIDIME <=1 SENSITIVE Sensitive     CEFTRIAXONE <=1 SENSITIVE Sensitive     CIPROFLOXACIN <=0.25 SENSITIVE Sensitive     GENTAMICIN <=1 SENSITIVE Sensitive     IMIPENEM 4 SENSITIVE Sensitive     TRIMETH/SULFA <=20 SENSITIVE Sensitive     AMPICILLIN/SULBACTAM 16 INTERMEDIATE Intermediate     PIP/TAZO <=4 SENSITIVE Sensitive     * ABUNDANT MORGANELLA MORGANII   Pseudomonas aeruginosa - MIC*    CEFTAZIDIME <=1 SENSITIVE Sensitive     CIPROFLOXACIN <=0.25 SENSITIVE Sensitive     GENTAMICIN <=1 SENSITIVE Sensitive     IMIPENEM 2 SENSITIVE Sensitive     PIP/TAZO <=4 SENSITIVE Sensitive     CEFEPIME <=1 SENSITIVE Sensitive     * ABUNDANT PSEUDOMONAS AERUGINOSA   Proteus mirabilis - MIC*    AMPICILLIN <=2 SENSITIVE Sensitive     CEFAZOLIN 8 SENSITIVE Sensitive     CEFEPIME <=1 SENSITIVE Sensitive     CEFTAZIDIME <=1 SENSITIVE Sensitive     CEFTRIAXONE <=1 SENSITIVE Sensitive     CIPROFLOXACIN <=0.25 SENSITIVE Sensitive     GENTAMICIN <=1 SENSITIVE Sensitive     IMIPENEM 4 SENSITIVE Sensitive     TRIMETH/SULFA <=20 SENSITIVE Sensitive     AMPICILLIN/SULBACTAM <=2 SENSITIVE Sensitive     PIP/TAZO <=4 SENSITIVE Sensitive     * ABUNDANT PROTEUS MIRABILIS  Vancomycin resistant enterococcus - MIC*    AMPICILLIN >=32 RESISTANT Resistant     VANCOMYCIN >=32 RESISTANT Resistant     GENTAMICIN SYNERGY RESISTANT Resistant     LINEZOLID 2 SENSITIVE Sensitive     * MODERATE VANCOMYCIN RESISTANT ENTEROCOCCUS         Radiology Studies: No results found.      Scheduled Meds: . ambrisentan  5 mg Oral Daily  . brimonidine  1 drop Right Eye BID  . brinzolamide  1 drop Both Eyes BID  . docusate sodium  100 mg Oral BID  . furosemide  40 mg Intravenous Daily  . Gerhardt's butt cream   Topical BID  .  latanoprost  1 drop Right Eye QHS  . midodrine  10 mg Oral TID WC  . sodium chloride flush  3 mL Intravenous Q12H  . timolol  1 drop Both Eyes BID   Continuous Infusions: . sodium chloride    .  ceFAZolin (ANCEF) IV    . linezolid (ZYVOX) IV Stopped (06/03/17 2306)  . methocarbamol (ROBAXIN)  IV    . piperacillin-tazobactam (ZOSYN)  IV Stopped (06/03/2017 0813)     LOS: 16 days    Time spent: 40 minutes    [name], Geraldo Docker, MD [facility] Pager [phone]   If 7PM-7AM, please contact night-coverage [email or web address] Password [email or web address] 06/03/2017, 8:44 AM

## 2017-06-04 NOTE — Anesthesia Preprocedure Evaluation (Addendum)
Anesthesia Evaluation  Patient identified by MRN, date of birth, ID band Patient confused    Reviewed: Allergy & Precautions, H&P , NPO status , Patient's Chart, lab work & pertinent test results, Unable to perform ROS - Chart review onlyPreop documentation limited or incomplete due to emergent nature of procedure.  History of Anesthesia Complications Negative for: history of anesthetic complications  Airway Mallampati: Intubated       Dental   Pulmonary shortness of breath and Long-Term Oxygen Therapy, former smoker,     + decreased breath sounds      Cardiovascular hypertension, + CAD, + Past MI, + Cardiac Stents, + Peripheral Vascular Disease, +CHF and + DOE  + dysrhythmias Atrial Fibrillation + Valvular Problems/Murmurs MR  Rhythm:Irregular Rate:Tachycardia + Systolic murmurs TTE 51/8841 - Ejection fraction was in the range of 60% to 65%. Indeterminant diastolic function (atrial fibrillation). - Ventricular septum: D-shaped interventricular septum suggesting  RV pressure/volume overload. - Mitral valve: There was mild mitral valve prolapse with an eccentric, posteriorly-directed mitral regurgitation jet. The MR was at least moderate but was not fully  visualized. - Left atrium: The atrium was moderately to severely dilated. - Right ventricle: The cavity size was moderately dilated. Systolic  function was mildly reduced. - Right atrium: The atrium was severely dilated. - Tricuspid valve: There was moderate regurgitation. Peak RV-RA  gradient (S): 44 mm Hg. - Pulmonary arteries: PA peak pressure: 59 mm Hg (S). - Systemic veins: IVC measured 2.6 cm with < 50% respirophasic  variation, suggesting RA pressure 15 mmHg.   Neuro/Psych  Neuromuscular disease negative psych ROS   GI/Hepatic GERD  Controlled,GI hemorrhage   Endo/Other  Morbid obesity  Renal/GU CRFRenal disease     Musculoskeletal  (+) Arthritis ,   Abdominal (+) +  obese,   Peds  Hematology  (+) anemia ,   Anesthesia Other Findings   Reproductive/Obstetrics                            Anesthesia Physical  Anesthesia Plan  ASA: V and emergent  Anesthesia Plan: General   Post-op Pain Management:    Induction: Inhalational  PONV Risk Score and Plan: 4 or greater and Ondansetron, Dexamethasone, Scopolamine patch - Pre-op and Treatment may vary due to age or medical condition  Airway Management Planned: Oral ETT  Additional Equipment: Arterial line and CVP  Intra-op Plan:   Post-operative Plan: Post-operative intubation/ventilation  Informed Consent: I have reviewed the patients History and Physical, chart, labs and discussed the procedure including the risks, benefits and alternatives for the proposed anesthesia with the patient or authorized representative who has indicated his/her understanding and acceptance.   Only emergency history available  Plan Discussed with: CRNA, Surgeon and Anesthesiologist  Anesthesia Plan Comments: (Patient coded upstairs in ICU prior to arrival. ICU team unable to place central line on either IJ. Patient receiving multiple units of blood, on high doses of epinephrine, levophed, and vasopressin, as well as intermittent boluses of epinephrine to maintain SBP > 60. Patient is unlikely to survive.)     Anesthesia Quick Evaluation

## 2017-06-04 NOTE — Plan of Care (Signed)
Problem: Skin Integrity: Goal: Risk for impaired skin integrity will decrease Outcome: Progressing Patient repositioned every 2 hours to prevent possible skin break down. On low airloss bari bed. Heels in prafo boots and elevated off bed.   afib on tele,  3L nasal cannula No complaints of pain as of this time.

## 2017-06-04 NOTE — Addendum Note (Signed)
Addendum  created 06/14/2017 1839 by Nolon Nations, MD   Sign clinical note

## 2017-06-04 NOTE — Progress Notes (Signed)
PT Cancellation Note  Patient Details Name: [name] MRN: [id number] DOB: [birth year]   Cancelled Treatment:    Reason Eval/Treat Not Completed: Patient at procedure or test/unavailable. Pt in OR for surgical debridement.   Lorriane Shire 06/15/2017, 10:00 AM

## 2017-06-04 NOTE — Interval H&P Note (Signed)
History and Physical Interval Note:  06/18/2017 8:59 PM  [place]  has presented today for surgery, with the diagnosis of BLEEDING DUEODENAL ULCER  The various methods of treatment have been discussed with the patient and family. After consideration of risks, benefits and other options for treatment, the patient has consented to  Procedure(s): EXPLORATORY LAPAROTOMY (N/A) as a surgical intervention .  The patient's history has been reviewed, patient examined, no change in status, stable for surgery.  I have reviewed the patient's chart and labs.  Questions were answered to the patient's satisfaction.   Discussed extreme situation with her husband and recommended exploratory laparotomy  She has a high risk of complication and death and this is discussed with her husband.  He understands the svere nature and urgent need for surgery and agrees for [place] to proceed.   The procedure has been discussed with the patient.  Alternative therapies have been discussed with the patient.  Operative risks include bleeding,  Infection,  Organ injury,  Nerve injury,  Blood vessel injury,  DVT,  Pulmonary embolism,  Death,  And possible reoperation.  Medical management risks include worsening of present situation.  The success of the procedure is 50 -90 % at treating patients symptoms.  The patient understands and agrees to proceed.  Analee Montee A.

## 2017-06-04 NOTE — Consult Note (Signed)
[facility] Consult: 3:56 PM 06/08/2017  LOS: 16 days    Referring Provider: Dr Elsworth Soho  Primary Care Physician:  Redmond School, MD Primary Gastroenterologist:  Dr. Gala Romney     Reason for Consultation:  Gastrointestinal bleeding   HPI: [place] is a 82 y.o. female.  Hx of MI, Chronic Atrial Fibrillation.  Morbid obesity.  CAD bare-metal stent 07/2005.  Previously on Eliquis.  Diastolic CHF, Pulmonary HTN, severe Tricuspid Valve Regurgitation, chronic respiratory failure (on 2.5 L O2 at night), CKD stage 3.  GERD.   09/2003 EGD.  Normal study. 2/2005Colonoscopy  . 2 small polyps in the transverse colon were ablated. Small internal and external hemorrhoids. 2011 EGD. Small hiatal hernia. Tiny antral erosions.  2011 Colonoscopy.  Left-sided diverticulosis. Polyp in the ascending colon.  2011 Colonoscopy. Pathology adenomatous polyp  Sustained traumatic left leg hematoma in setting of Eliquis complicated by hypovolemic shock and acute blood loss anemia requiring admission 05/10/17-05/16/17.  S/p wound debridement, wound VAC.  Will need a skin graft.  Readmitted 06/02/2017 with an acute on chronic diastolic CHF exacerbation associated with hypotension and elevated troponin.  Debridement on 10/5, 10/10, 10/12 and again today.  ABX coverage for VRE.  Has received multiple transfusions.      Underwent surgical wound debridement 10/17 and developed hypotension and bleeding PR afterwards. In AM in PACU, passed formed stool with some blood + hypotension.  Was given 3 units blood. This afternoon, still in PACU had dark red blood and more hypotension. On pressors.        Hgbs last 5 days: 6.7 >> 7.9 >> 8.5 .. 8.1 >> 7.1 >> 8.8 at 1520 today.  PT 17 point 4, INR 1.4.  BUN slightly elevated at 24, creatinine  normal. Alkaline phosphatase elevated at 194.  Normal total bilirubin and transaminases. Albumin low at 1.4.   Not on PPI.    Patient denies previous GI bleeding. 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 Feel sick to her stomach. Her stomach feels a little bit unsettled maybe a little bit crampy but there is really no pain.  She has no knowledge of any liver disease.     Past Medical History:  Diagnosis Date  . A-fib ([place])   . Arthritis    "knees" (11/17/2015)  . CAD (coronary artery disease)    BMS to LAD 07/2005  . Chronic atrial fibrillation ([place])    Since 2011  . Degenerative arthritis of right knee   . Essential hypertension   . GERD (gastroesophageal reflux disease)   . Hyperlipidemia   . Morbid obesity ([place])   . Myocardial infarction ([place]) 07/2005  . On home oxygen therapy    "2.5L at night" (11/17/2015)  . Psoriasis   . Pulmonary hypertension ([facility])    Mixed, PASP 67 mmHg at [facility] 2013  . Right ventricular dysfunction   . Tricuspid regurgitation    Severe    Past Surgical History:  Procedure Laterality Date  . ACHILLES TENDON SURGERY Right 1970s  . CARDIAC CATHETERIZATION N/A 12/07/2015  Procedure: Right Heart Cath;  Surgeon: Jolaine Artist, MD;  Location: [facility];  Service: Cardiovascular;  Laterality: N/A;  . CARPAL TUNNEL RELEASE  11/29/2011   Procedure: CARPAL TUNNEL RELEASE;  Surgeon: Cammie Sickle., MD;  Location: [facility];  Service: Orthopedics;  Laterality: Right;  . COLONOSCOPY    . CORONARY ANGIOPLASTY WITH STENT PLACEMENT  07/2005   Dr. Olevia Perches  . EYE SURGERY    . I&D EXTREMITY Left 06/05/2017   Procedure: IRRIGATION AND DEBRIDEMENT LEFT THIGH AND LEFT LEG;  Surgeon: [name], MD;  Location: [facility];  Service: Orthopedics;  Laterality: Left;  . I&D EXTREMITY Left 05/19/2017   Procedure: REPEAT IRRIGATION AND DEBRIDEMENT LEFT THIGH AND LEFT LEG;  Surgeon: [name], MD;  Location: [facility];   Service: Orthopedics;  Laterality: Left;  . LAPAROSCOPIC CHOLECYSTECTOMY    . RETINAL DETACHMENT SURGERY Right   . SKIN SPLIT GRAFT Left 06/07/2017   Procedure: DEBRIDEMENT LEFT LEG AND THIGH;  Surgeon: [name], MD;  Location: [facility];  Service: Orthopedics;  Laterality: Left;    Prior to Admission medications   Medication Sig Start Date End Date Taking? Authorizing Provider  ALPRAZolam Duanne Moron) 0.5 MG tablet Take 1 tablet (0.5 mg total) by mouth at bedtime. 05/16/17  Yes Arrien, Jimmy Picket, MD  ambrisentan (LETAIRIS) 5 MG tablet Take 1 tablet (5 mg total) by mouth daily. 05/21/17  Yes Collene Gobble, MD  atorvastatin (LIPITOR) 80 MG tablet TAKE 1/2 TABLET BY MOUTH DAILY Patient taking differently: TAKE 1/2 TABLET (40 MG) BY MOUTH DAILY AT BEDTIME 08/07/15  Yes Satira Sark, MD  azelastine (ASTELIN) 0.1 % nasal spray Place 2 sprays into both nostrils 2 (two) times daily as needed (congestion).  02/21/17  Yes [provider]  brimonidine (ALPHAGAN) 0.2 % ophthalmic solution Place 1 drop into the right eye 2 (two) times daily.  04/21/15  Yes [provider]  brinzolamide (AZOPT) 1 % ophthalmic suspension Place 1 drop into both eyes 2 (two) times daily.   Yes [provider]  HYDROcodone-acetaminophen (NORCO/VICODIN) 5-325 MG tablet Take 1 tablet by mouth 3 (three) times daily as needed for moderate pain. 05/16/17  Yes Arrien, Jimmy Picket, MD  latanoprost (XALATAN) 0.005 % ophthalmic solution Place 1 drop into the right eye at bedtime.  03/30/12  Yes [provider]  nitroGLYCERIN (NITROSTAT) 0.4 MG SL tablet Place 0.4 mg under the tongue every 5 (five) minutes as needed for chest pain (x 3 tablets daily). Reported on 12/28/2015   Yes [provider]  Omeprazole 20 MG TBEC Take 20 mg by mouth daily.    Yes [provider]  OXYGEN Inhale 2.5 L into the lungs at bedtime.   Yes [provider]  timolol (TIMOPTIC) 0.5 %  ophthalmic solution Place 1 drop into both eyes 2 (two) times daily. 11/20/15  Yes [provider]    Scheduled Meds: . [MAR Hold] ambrisentan  5 mg Oral Daily  . [MAR Hold] brimonidine  1 drop Right Eye BID  . [MAR Hold] brinzolamide  1 drop Both Eyes BID  . [MAR Hold] docusate sodium  100 mg Oral BID  . [MAR Hold] furosemide  40 mg Intravenous Daily  . [MAR Hold] Gerhardt's butt cream   Topical BID  . [MAR Hold] latanoprost  1 drop Right Eye QHS  . [MAR Hold] midodrine  10 mg Oral TID WC  . [MAR Hold] sodium chloride flush  3 mL Intravenous Q12H  . [MAR Hold] timolol  1 drop Both Eyes BID   Infusions: . [MAR Hold] sodium chloride    . sodium chloride    . sodium chloride    . albumin human    . lactated ringers 10 mL/hr at 05/28/2017 0935  . [MAR Hold] linezolid (ZYVOX) IV Stopped (06/03/17 2306)  . [MAR Hold] methocarbamol (ROBAXIN)  IV    . phenylephrine (NEO-SYNEPHRINE) Adult infusion 120 mcg/min (05/28/2017 1500)  . phenylephrine (NEO-SYNEPHRINE) Adult infusion    . [MAR Hold] piperacillin-tazobactam (ZOSYN)  IV Stopped (06/01/2017 0813)   PRN Meds: [JHE Hold] sodium chloride, [MAR Hold] acetaminophen **OR** [MAR Hold] acetaminophen, [MAR Hold] bisacodyl, HYDROmorphone (DILAUDID) injection, [MAR Hold]  HYDROmorphone (DILAUDID) injection, meperidine (DEMEROL) injection, [MAR Hold] methocarbamol **OR** [MAR Hold] methocarbamol (ROBAXIN)  IV, [MAR Hold] ondansetron **OR** [MAR Hold] ondansetron (ZOFRAN) IV, oxyCODONE **OR** oxyCODONE, [MAR Hold] oxyCODONE, [MAR Hold] polyethylene glycol, promethazine, [MAR Hold] sodium chloride flush   Allergies as of 05/31/2017  . (No Known Allergies)    Family History  Problem Relation Age of Onset  . Heart attack Mother   . Coronary artery disease Father   . Stroke Father   . Heart attack Brother   . Heart failure Brother   . COPD Brother   . Emphysema Sister   . Emphysema Brother     Social History   Social History  . Marital  status: Married    Spouse name: N/A  . Number of children: 2  . Years of education: N/A   Occupational History  . retired Retired   Social History Main Topics  . Smoking status: Former Smoker    Packs/day: 0.30    Years: 0.50    Types: Cigarettes    Quit date: 08/19/1986  . Smokeless tobacco: Never Used     Comment: pt only smoked for only a few months.  . Alcohol use No  . Drug use: No  . Sexual activity: No   Other Topics Concern  . Not on file   Social History Narrative   L[place] with [place]. 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 They both stay active around the farm, maintaining lawn and garden.    REVIEW OF SYSTEMS: Constitutional:  Was feeling okay before her surgery. She feels a little bit weak at the moment. ENT:  No nose bleeds Pulm:  No trouble breathing No cough CV:  No palpitations, no LE edema.  GU:  No hematuria, no frequency GI:  Takes PPI for heartburn which is well controlled at home. Heme:  At home before she had the big fall leading to the hematoma, she did not have any excessive bleeding or bruising.   Transfusions:  Multiple transfusions during this admission I am counting 8 packed  cells Neuro:  No headaches, no peripheral tingling or numbness Derm:  No itching, no rash or sores.  Endocrine:  No sweats or chills.  No polyuria or dysuria Immunization:  Not discussed Travel:  None beyond local counties in last few months.    PHYSICAL EXAM: Vital signs in last 24 hours: Vitals:   05/24/2017 1515 05/26/2017 1530  BP: (!) 83/47 (!) 77/67  Pulse: 68 (!) 58  Resp: (!) 21 (!) 26  Temp:    SpO2: 100% 100%   Wt Readings from Last 3 Encounters:  06/06/2017 98.9 kg (218 lb)  05/16/17 101.7 kg (224 lb 3.3 oz)  04/18/17 91.1 kg (200 lb 12.8 oz)    General: pleasant, pale. Elderly WF. She does not look 61, she looks younger. Head:  No facial asymmetry or swelling. No signs of  head trauma.  Eyes:  No scleral icterus or conjunctival pallor. EOMI. Ears:  Not hard of hearing  Nose:  No congestion or discharge Mouth:  No blood in the mouth. Oral mucosa moist, pink, clear. Neck:  no JVD, no masses, no thyromegaly. Lungs:  nonlabored breathing. No cough. Lungs clear bilaterally. Heart: irreg, irreg. No MRG. S1, S2 present. Abdomen:  Active bowel sounds. Obese. Soft. Not distended or tender. No HSM, hernias, bruits, masses..   Rectal: DRE not performed. Large pool of deep burgundy colored blood seeping around the thighs and groin.   Musc/Skeltl: did not examine the surgical site. No gross joint deformities. Extremities:  onpitting pedal edema.  Neurologic:  Patient alert. Oriented times 3. Provides good history. Skin:   No rashes   Psych:  Pleasant, cooperative.     Recent Labs  06/02/17 0310 06/03/17 0526 06/10/2017 1518 05/24/2017 1520  WBC 17.3* 11.6*  --  21.3*  HGB 8.5* 8.1* 7.1* 8.0*  HCT 27.5* 27.3* 21.0* 25.1*  PLT 236 265  --  198   BMET Lab Results  Component Value Date   NA 142 06/16/2017   NA 137 06/03/2017   NA 137 06/03/2017   K 4.8 06/02/2017   K 4.8 06/01/2017   K 2.9 (L) 06/03/2017   CL 92 (L) 06/14/2017   CL 89 (L) 06/03/2017   CL 90 (L) 06/01/2017   CO2 38 (H) 05/29/2017   CO2 40 (H) 06/03/2017   CO2 34 (H) 06/01/2017   GLUCOSE 88 06/12/2017   GLUCOSE 121 (H) 06/03/2017   GLUCOSE 96 06/01/2017   BUN 21 (H) 06/09/2017   BUN 21 (H) 06/03/2017   BUN 38 (H) 06/01/2017   CREATININE 0.76 05/31/2017   CREATININE 0.92 06/03/2017   CREATININE 1.03 (H) 06/01/2017   CALCIUM 8.5 (L) 06/06/2017   CALCIUM 8.3 (L) 06/03/2017   CALCIUM 8.0 (L) 06/01/2017   LFT  Recent Labs  06/03/17 0526  PROT 4.4*  ALBUMIN 1.4*  AST 32  ALT <5*  ALKPHOS 194*  BILITOT 0.6   PT/INR Lab Results  Component Value Date   INR 1.23 06/01/2017   INR 1.25 05/22/2017   INR 1.7 12/20/2015    IMPRESSION:   *  Acute GI bleed with hypotension.  R/o  upper vs lower source.  ulcer vs diverticular? Established diagnosis of diverticulosis based on 2011 colonoscopy.  *  Blood loss anemia, multifactorial.   PLAN:     *  Ordered Protonix drip. Follow CBC. Reassess the patient in the morning. Consider upper endoscopy to rule out upper GI source.    Azucena Freed  05/31/2017, 3:56 PM Pager: [phone]  ________________________________________________________________________  Velora Heckler GI MD note:  I personally examined the patient, reviewed the data and agree with the assessment and plan described above. She is having massive GI bleeding, unclear upper vs. Lower.  I favor lower since BUN no real bump this afternoon and she has known diverticulosis, does not take NSAIDs. She is just getting settled in to the ICU now and team is going to drop NG with lavage. If this shows overt bleeding I will proceed with EGD tonight after a bit more stablization , IV protonix bolus. If it is negative, then CT bleeding scan (with reflex angiography if positive).   Owens Loffler, MD  [facility] Pager [phone]

## 2017-06-04 NOTE — Transfer of Care (Signed)
Immediate Anesthesia Transfer of Care Note  Patient: [name]  Procedure(s) Performed: DEBRIDEMENT LEFT THIGH WOUND (Left Leg Upper)  Patient Location: PACU  Anesthesia Type:General  Level of Consciousness: alert , oriented, drowsy and patient cooperative  Airway & Oxygen Therapy: Patient Spontanous Breathing and Patient connected to face mask oxygen  Post-op Assessment: Report given to RN and Post -op Vital signs reviewed and stable  Post vital signs: Reviewed and stable  Last Vitals:  Vitals:   06/14/2017 0700 06/05/2017 0800  BP:  (!) 128/109  Pulse:    Resp: (!) 21 17  Temp: 36.8 C   SpO2:      Last Pain:  Vitals:   05/30/2017 0700  TempSrc: Oral  PainSc:       Patients Stated Pain Goal: 0 (11/55/20 8022)  Complications: No apparent anesthesia complications

## 2017-06-04 NOTE — Anesthesia Procedure Notes (Signed)
Procedure Name: LMA Insertion Date/Time: 06/15/2017 10:12 AM Performed by: Everlean Cherry A Pre-anesthesia Checklist: Patient identified, Emergency Drugs available, Suction available and Patient being monitored Patient Re-evaluated:Patient Re-evaluated prior to induction Oxygen Delivery Method: Circle system utilized Preoxygenation: Pre-oxygenation with 100% oxygen Induction Type: IV induction Ventilation: Mask ventilation without difficulty LMA: LMA inserted LMA Size: 4.0 Number of attempts: 1 Placement Confirmation: positive ETCO2 and breath sounds checked- equal and bilateral Tube secured with: Tape Dental Injury: Teeth and Oropharynx as per pre-operative assessment

## 2017-06-04 NOTE — Procedures (Signed)
Central Venous Catheter Insertion Procedure Note [name] [id number] November 19, 1934 Prior to this attempt  There was an attempt by surgeon for a CVC catheter in left subclavian which was unsuccessful  Procedure: Insertion of Central Venous Catheter Indications: Assessment of intravascular volume, Drug and/or fluid administration and Frequent blood sampling  Procedure Details Consent: Unable to obtain consent because of emergent medical necessity. Time Out: Verified patient identification, verified procedure, site/side was marked, verified correct patient position, special equipment/implants available, medications/allergies/relevent history reviewed, required imaging and test results available.  Performed  Maximum sterile technique was used including antiseptics, cap, gloves, gown, hand hygiene, mask and sheet. Skin prep: Chlorhexidine; local anesthetic administered A antimicrobial bonded/coated single lumen catheter was placed in the left internal jugular vein using the Seldinger technique.  Evaluation Unsuccessful attempt Blood flow poor Complications: No apparent complications Patient did tolerate procedure well. Chest X-ray ordered to verify placement.  CXR: not completed due to emergent OR.  [name] 05/30/2017, 9:22 PM

## 2017-06-04 NOTE — Op Note (Signed)
[facility] Patient Name: [name] Procedure Date : 06/03/2017 MRN: [id number] Attending MD: Milus Banister , MD Date of Birth: October 07, 1934 CSN: [id number] Age: 82 Admit Type: Inpatient Procedure:                Upper GI endoscopy Indications:              Hematochezia, hypotension, Suspected upper                            gastrointestinal bleeding (gastric lavage + for red                            blood) Providers:                Milus Banister, MD, Zenon Mayo, RN, William Dalton, Technician Referring MD:              Medicines:                Fentanyl 50 micrograms IV, Midazolam 3 mg IV Complications:            No immediate complications. Estimated blood loss:                            None. Estimated Blood Loss:     Estimated blood loss: none. Procedure:                Pre-Anesthesia Assessment:                           - Prior to the procedure, a History and Physical                            was performed, and patient medications and                            allergies were reviewed. The patient's tolerance of                            previous anesthesia was also reviewed. The risks                            and benefits of the procedure and the sedation                            options and risks were discussed with the patient.                            All questions were answered, and informed consent                            was obtained. Prior Anticoagulants: The patient has                            taken  no previous anticoagulant or antiplatelet                            agents. ASA Grade Assessment: III - A patient with                            severe systemic disease. After reviewing the risks                            and benefits, the patient was deemed in                            satisfactory condition to undergo the procedure.                           After obtaining informed consent,  the endoscope was                            passed under direct vision. Throughout the                            procedure, the patient's blood pressure, pulse, and                            oxygen saturations were monitored continuously. The                            EG-[id number] ([id number]) scope was introduced through the                            mouth, and advanced to the second part of duodenum.                            The upper GI endoscopy was accomplished without                            difficulty. The patient tolerated the procedure                            well. Scope In: Scope Out: Findings:      There was a large recent blood clot in the proximal stomach. There was       recent thin red blood coating some of the gastric mucosa.      A large (2cm) cratered ulcer with a large visible vessel was found in       the distal duodenal bulb. There was minor oozing of blood around the       ulcer crater. I injected 3 cc of dilute epinephine along the ulcer       crater. While considering options for further ulcer treatment       (coagulation vs. clip placement) the visible vessel burst and began to       bleed very briskly. Visualization was quickly reduced. I attempt       application of hemospray but this did not function adequately. I  terminated endoscopy and called general surgeon who came to the bedside       within 5 minutes.      The exam was otherwise without abnormality. Impression:               - Large distal duodenal bulb ulcer with visible                            vessel that began to briskly, uncontrolably bleed                            during this procedure. Moderate Sedation:      Moderate (conscious) sedation was administered by the endoscopy nurse       and supervised by the endoscopist. The following parameters were       monitored: oxygen saturation, heart rate, blood pressure, and response       to care. Total physician intraservice time was 20  minutes. Recommendation:           - Transport patient to OR when stable.                           - I spoke with her husband on the phone and he is                            heading to the hospital. Procedure Code(s):        --- Professional ---                           [phone], Esophagogastroduodenoscopy, flexible,                            transoral; with control of bleeding, any method Diagnosis Code(s):        --- Professional ---                           K26.4, Chronic or unspecified duodenal ulcer with                            hemorrhage                           K92.1, Melena (includes Hematochezia) CPT copyright 2016 American Medical Association. All rights reserved. The codes documented in this report are preliminary and upon coder review may  be revised to meet current compliance requirements. Milus Banister, MD 05/22/2017 8:39:28 PM This report has been signed electronically. Number of Addenda: 0

## 2017-06-04 NOTE — Anesthesia Postprocedure Evaluation (Addendum)
Anesthesia Post Note  Patient: [place] L Furness  Procedure(s) Performed: DEBRIDEMENT LEFT THIGH WOUND (Left Leg Upper)     Patient location during evaluation: PACU Anesthesia Type: General Level of consciousness: awake and alert Pain management: pain level controlled Vital Signs Assessment: post-procedure vital signs reviewed and stable Respiratory status: spontaneous breathing, nonlabored ventilation, respiratory function stable and patient connected to nasal cannula oxygen Cardiovascular status: stable (BP has been low , fluids replaced) Postop Assessment: no apparent nausea or vomiting Anesthetic complications: no    Last Vitals:  Vitals:   06/02/2017 1328 05/24/2017 1329  BP: (!) 77/59   Pulse: 82   Resp: (!) 25   Temp:  36.5 C  SpO2: 100%     Last Pain:  Vitals:   05/31/2017 1329  TempSrc: Temporal  PainSc:                  MASSAGEE,JAMES TERRILL  Pt with episodes of hypotension in PACU. Additional IV access obtained and phenylephrine gtt started. Hgb found to be ~6.5. Type and cross sent, but emergency release 3 units given. Pressure improved and discussed with Dr. Sharol Given and Dr. Sherral Hammers with primary service. I told Dr. Sharol Given we could not wean off phenylephrine and that she would need ICU admission. Shortly after, PACU called and said she had a large "bloody" bowel movement and that she was hypotensive again. She had recently finished her 3rd PRBC and istat repeated showing hgb of ~7.5. Ordered 2 more units PRBCs, aline obtained and planned CVL placement. Discussed with Dr. Elsworth Soho. Pt was able to go to ICU prior to my being able to place the CVL. Pressure had improved with additional PRBCs. Pt remained lucid throughout and on [place] satting 100%.

## 2017-06-04 NOTE — Progress Notes (Addendum)
1925: Pt resting in bed. A&O, following commands on 4L [place]. Neo infusing at 71mcg. No c/o pain or distress noted. GI preparing for EGD.  2000: EGD stopped d/t pt bleeding, Surgery consulted and called to control bleeding. Pt hypotensive, maxed on Neo at 441mcg. GI, Surgery and CCM at bedside. 2005: Levo maxed at 40, code called (see code sheet). Compressions started but stopped d/t pt moving. Pt intubated, Epi x2 given, Bicarb given, Albumin given, 2 units PRBCs started at 2020 and completed at 2045. Epi gtt started and neo gtt titrated off per CCM. Fent 21mcg given for pt movement. CVC attempted x2 by surgery and ccm, unsuccessful.  2120: OR staff at bedside to take pt. Currently infusing in PIVs- epi@8 , levo@40 , Vaso @ 0.03. L radial art line in place with good waveforms and  MAPs >60.   2345: Pt back from OR. Received 2U PRBC in OR.  0030: RIJ CVC placed 0045: Pt hypotensive, CCM at bedside 0105: Epi, bicarb, calcium given per CCM orders  0115: 2u PRBC started  CCM Md- Scatliffe states she is ok with MAP goal 55-60 for this pt. Will titrate accordingly  0410: E-Link notified of pt BP low despite being maxed on 4 pressors. Orders received to draw labs and 1L NS bolus now. Will continue to monitor.   0540: E-link notified of lab results and BP starting to drop again to MAP <55. Order received for another 1L NS bolus.  4920: E-Link Md notified of pt Plat. 63 from 198 yesterday. No new orders at this time.

## 2017-06-04 NOTE — Addendum Note (Signed)
Addendum  created 05/27/2017 1540 by Nolon Nations, MD   Order list changed, Order sets accessed

## 2017-06-04 NOTE — Transfer of Care (Signed)
Immediate Anesthesia Transfer of Care Note  Patient: [name]  Procedure(s) Performed: EXPLORATORY LAPAROTOMY REPAIR OF DUODENAL ULCER (N/A Abdomen)  Patient Location: ICU  Anesthesia Type:General  Level of Consciousness: Patient remains intubated per anesthesia plan  Airway & Oxygen Therapy: Patient remains intubated per anesthesia plan and Patient placed on Ventilator (see vital sign flow sheet for setting)  Post-op Assessment: Report given to RN and Post -op Vital signs reviewed and stable  Post vital signs: Reviewed and stable  Last Vitals:  Vitals:   06/03/2017 2010 06/15/2017 2015  BP: (!) 37/29 (!) 56/37  Pulse:    Resp: 20 (!) 0  Temp:    SpO2:      Last Pain:  Vitals:   05/25/2017 1730  TempSrc:   PainSc: 0-No pain      Patients Stated Pain Goal: 0 (75/44/92 0100)  Complications: No apparent anesthesia complications

## 2017-06-04 NOTE — Procedures (Signed)
Endotracheal Intubation Procedure Note  Indication for endotracheal intubation: airway compromise and respiratory failure. Airway Assessment: Mallampati Class: I (soft palate, uvula, fauces, and tonsillar pillars visible). Sedation: none. Paralytic: none. Lidocaine: no. Atropine: no. Equipment: Macintosh 3 laryngoscope blade and 7.65mm cuffed endotracheal tube. Cricoid Pressure: no. Number of attempts: 1. ETT location confirmed by by auscultation and ETCO2 monitor. Emergently moved to OR- did not receive CXR  Rise Paganini Glenice Ciccone 06/16/2017

## 2017-06-04 NOTE — H&P (View-Only) (Signed)
Patient ID: [name], female   DOB: 02-Jan-1935, 82 y.o.   MRN: [id number] Patient has approximately 300 mL in the wound VAC canister. We will plan to return to the operating room on Wednesday. The last 2 times to the operating room we have had a necrotic layer of fibrinous tissue covering the whole wound. If we do not make any progress with the wound bed we may need to consider more aggressive surgery.

## 2017-06-04 NOTE — Interval H&P Note (Signed)
History and Physical Interval Note:  06/07/2017 7:57 AM  [name]  has presented today for surgery, with the diagnosis of WOUND LEFT THIGH, LEG  The various methods of treatment have been discussed with the patient and family. After consideration of risks, benefits and other options for treatment, the patient has consented to  Procedure(s): DEBRIDEMENT LEFT THIGH WOUND (Left) as a surgical intervention .  The patient's history has been reviewed, patient examined, no change in status, stable for surgery.  I have reviewed the patient's chart and labs.  Questions were answered to the patient's satisfaction.     [name]

## 2017-06-04 NOTE — Progress Notes (Signed)
Patients blood pressure began to drop to 50's. Dr. Massage notified and orders received.1115 albumin adminstered. 1120 Dr. Lissa Hoard at bedside with medication. 1128 NEO was started at 18mcg. Patient states she does feel a little dizzy and lightheaded. 2 CRNA's at bedside starting new IV sites. NEO bumped up per no change in patient's BP.  1146 CRNA drew Istat. 1150 type and screen sent to lab for low hemoglobin. Patient arouses and oriented to person place and time. 2Units of adminstered

## 2017-06-04 NOTE — Anesthesia Preprocedure Evaluation (Signed)
Anesthesia Evaluation  Patient identified by MRN, date of birth, ID band Patient awake    Reviewed: Allergy & Precautions, H&P , NPO status , Patient's Chart, lab work & pertinent test results  History of Anesthesia Complications Negative for: history of anesthetic complications  Airway Mallampati: IV  TM Distance: <3 FB Neck ROM: Full    Dental  (+) Teeth Intact, Dental Advisory Given   Pulmonary shortness of breath and Long-Term Oxygen Therapy, former smoker,     + decreased breath sounds      Cardiovascular hypertension, + CAD, + Past MI, + Cardiac Stents, + Peripheral Vascular Disease, +CHF and + DOE  + dysrhythmias Atrial Fibrillation + Valvular Problems/Murmurs MR  Rhythm:Irregular Rate:Normal + Systolic murmurs TTE 79/1505 - Ejection fraction was in the range of 60% to 65%. Indeterminant diastolic function (atrial fibrillation). - Ventricular septum: D-shaped interventricular septum suggesting  RV pressure/volume overload. - Mitral valve: There was mild mitral valve prolapse with an eccentric, posteriorly-directed mitral regurgitation jet. The MR was at least moderate but was not fully  visualized. - Left atrium: The atrium was moderately to severely dilated. - Right ventricle: The cavity size was moderately dilated. Systolic  function was mildly reduced. - Right atrium: The atrium was severely dilated. - Tricuspid valve: There was moderate regurgitation. Peak RV-RA  gradient (S): 44 mm Hg. - Pulmonary arteries: PA peak pressure: 59 mm Hg (S). - Systemic veins: IVC measured 2.6 cm with < 50% respirophasic  variation, suggesting RA pressure 15 mmHg.   Neuro/Psych  Neuromuscular disease    GI/Hepatic GERD  Controlled,  Endo/Other  Morbid obesity  Renal/GU CRFRenal disease     Musculoskeletal  (+) Arthritis ,   Abdominal (+) + obese,   Peds  Hematology  (+) anemia ,   Anesthesia Other Findings   Reproductive/Obstetrics                             Anesthesia Physical Anesthesia Plan  ASA: III  Anesthesia Plan: General   Post-op Pain Management:    Induction: Intravenous  PONV Risk Score and Plan: 4 or greater and Ondansetron, Dexamethasone, Midazolam, Scopolamine patch - Pre-op, Propofol infusion and Treatment may vary due to age or medical condition  Airway Management Planned: LMA  Additional Equipment:   Intra-op Plan:   Post-operative Plan: Extubation in OR  Informed Consent: I have reviewed the patients History and Physical, chart, labs and discussed the procedure including the risks, benefits and alternatives for the proposed anesthesia with the patient or authorized representative who has indicated his/her understanding and acceptance.   Dental advisory given  Plan Discussed with: CRNA  Anesthesia Plan Comments:         Anesthesia Quick Evaluation

## 2017-06-04 NOTE — Op Note (Signed)
05/30/2017 - 06/07/2017  10:40 AM  PATIENT:  [name]    PRE-OPERATIVE DIAGNOSIS:  WOUND LEFT THIGH, LEG  POST-OPERATIVE DIAGNOSIS:  Same  PROCEDURE:  DEBRIDEMENT LEFT THIGH WOUND  SURGEON:  [name], MD  PHYSICIAN ASSISTANT:None ANESTHESIA:   General  PREOPERATIVE INDICATIONS:  [name] is a  82 y.o. female with a diagnosis of WOUND LEFT THIGH, LEG who failed conservative measures and elected for surgical management.    The risks benefits and alternatives were discussed with the patient preoperatively including but not limited to the risks of infection, bleeding, nerve injury, cardiopulmonary complications, the need for revision surgery, among others, and the patient was willing to proceed.  OPERATIVE IMPLANTS: instillation wound VAC foam without instillation  OPERATIVE FINDINGS: 90% healthy beefy granulation tissue  OPERATIVE PROCEDURE: patient was brought to the operating room and underwent a general anesthetic. After adequate levels anesthesia obtained patient's left lower extremity was prepped using Betadine paint and draped into a sterile field a timeout was called. Patient was last debridement was using the instillation firm without installation. Patient had remarkable improvement in the granulation tissue at the wound bed. She now has approximately 90% healthy granulation tissue 50% fibrinous exudative tissue. Of note patient has exposed bone over three quarters of the tibial crest. There is exposed patella tendon. A 21 blade knife was used and further fibrinous exudative tissue was excised. A Ronjair was used for excision. There was good bleeding granulation tissue over the entire wound bed except for over the tibial crest with exposed bone. The instillation wound VAC foam was applied this was covered with Ioban to of the suction sponges were used this had a good suction fit this was covered with Coban and. Patient was extubated taken the PACU in stable  condition  With the exposed patella tendon and exposed tibial crest patient may require an above-the-knee amputation. Patient does have a posterior soft tissue flap and this would most likely be able to cover the residual limb and avoid massive split thickness skin grafting.

## 2017-06-04 NOTE — Consult Note (Signed)
PULMONARY / CRITICAL CARE MEDICINE   Name: [name] MRN: [id number] DOB: [birth year]    ADMISSION DATE:  05/24/2017 CONSULTATION DATE:  05/21/2017  REFERRING MD:  Dr. Sherral Hammers  CHIEF COMPLAINT:  Acute hypotension  HISTORY OF PRESENT ILLNESS:   82 year old female with history of HFpEF, necrotizing fasciitis of left lower leg, HLD, tricuspid regurg, pulmonary hypertension, CKD III, COPD on 2.5L at night presents with hypotension requiring pressor support after repeat left leg wound debridement earlier today (10/17). Patient was in her usual state of health until 9/22 when she presented for traumatic left leg hematoma and hypovolemic shock. She was admitted to the hospital 05/10/17-05/16/2017 and subsequently discharged to SNF where she experienced worsening weakness. She was then readmitted with acute on chronic HFpEF exacerbation on 10/1.  During this hospital stay patient continued to have hemodynamic instability requiring 8 units PRBC over her hospital course. She has been taken back to the OR  On 10/5, 10/10, 10/12, and 10/17 for further wound debridement. Today, after her procedure was completed and wound vac in place, she was in PACU where her blood pressure was noted to drop to 98P systolic. She was given albumin. Neo was initiated. Patient indicated she was dizzy and lightheaded. 3u PRBC were administered. PCCM was called for consult.  Of note patient had grossly bloody stool in the PACU with her hypotensive episode. Bowel movement was described as formed but grossly bloody/in a pool of blood. She denies prior episodes of melena or hematochezia. She endorses being constipated over past few days.   PAST MEDICAL HISTORY :  She  has a past medical history of A-fib ([place]); Arthritis; CAD (coronary artery disease); Chronic atrial fibrillation ([place]); Degenerative arthritis of right knee; Essential hypertension; GERD (gastroesophageal reflux disease); Hyperlipidemia; Morbid obesity ([place]);  Myocardial infarction [facility]) (07/2005); On home oxygen therapy; Psoriasis; Pulmonary hypertension ([place]); Right ventricular dysfunction; and Tricuspid regurgitation.  PAST SURGICAL HISTORY: She  has a past surgical history that includes Achilles tendon surgery (Right, 1970s); Colonoscopy; Carpal tunnel release (11/29/2011); Laparoscopic cholecystectomy; Retinal detachment surgery (Right); Eye surgery; Coronary angioplasty with stent (07/2005); Cardiac catheterization (N/A, 12/07/2015); I&D extremity (Left, 05/29/2017); I&D extremity (Left, 06/15/2017); and Skin split graft (Left, 05/24/2017).  No Known Allergies  No current facility-administered medications on file prior to encounter.    Current Outpatient Prescriptions on File Prior to Encounter  Medication Sig  . ALPRAZolam (XANAX) 0.5 MG tablet Take 1 tablet (0.5 mg total) by mouth at bedtime.  [name] atorvastatin (LIPITOR) 80 MG tablet TAKE 1/2 TABLET BY MOUTH DAILY (Patient taking differently: TAKE 1/2 TABLET (40 MG) BY MOUTH DAILY AT BEDTIME)  . azelastine (ASTELIN) 0.1 % nasal spray Place 2 sprays into both nostrils 2 (two) times daily as needed (congestion).   . brimonidine (ALPHAGAN) 0.2 % ophthalmic solution Place 1 drop into the right eye 2 (two) times daily.   . brinzolamide (AZOPT) 1 % ophthalmic suspension Place 1 drop into both eyes 2 (two) times daily.  [name] HYDROcodone-acetaminophen (NORCO/VICODIN) 5-325 MG tablet Take 1 tablet by mouth 3 (three) times daily as needed for moderate pain.  [name] latanoprost (XALATAN) 0.005 % ophthalmic solution Place 1 drop into the right eye at bedtime.   . nitroGLYCERIN (NITROSTAT) 0.4 MG SL tablet Place 0.4 mg under the tongue every 5 (five) minutes as needed for chest pain (x 3 tablets daily). Reported on 12/28/2015  . Omeprazole 20 MG TBEC Take 20 mg by mouth daily.   . OXYGEN Inhale 2.5 L  into the lungs at bedtime.  . timolol (TIMOPTIC) 0.5 % ophthalmic solution Place 1 drop into both eyes 2 (two) times  daily.    FAMILY HISTORY:  Her indicated that her mother is deceased. She indicated that her father is deceased. She indicated that the status of her sister is unknown.    SOCIAL HISTORY: She  reports that she quit smoking about 30 years ago. Her smoking use included Cigarettes. She has a 0.15 pack-year smoking history. She has never used smokeless tobacco. She reports that she does not drink alcohol or use drugs.  REVIEW OF SYSTEMS:   No dizziness or palpitations. No blurry vision. No chest pain or dyspnea. No abdominal pain. No N/V/D/C. No dysuria, frequency or urinary urgency.    VITAL SIGNS: BP (!) 89/67 (BP Location: Right Arm)   Pulse 81   Temp 97.8 F (36.6 C) (Temporal)   Resp (!) 24   Ht 5' 2.99" (1.6 m)   Wt 218 lb (98.9 kg)   SpO2 100%   BMI 38.63 kg/m   HEMODYNAMICS:    VENTILATOR SETTINGS:    INTAKE / OUTPUT: I/O last 3 completed shifts: In: 1875 [P.O.:775; IV Piggyback:1100] Out: 2775 [Urine:2250; Drains:525]  PHYSICAL EXAMINATION: General:  NAD, chronically ill appearing female rests in bed Neuro:  Alert and oriented, following commands, no focal deficits HEENT:  MMM, PERRL EOMI, no scleral icterus or conjunctival injection  Cardiovascular:  RRR, no m/r/g Lungs:  CTA anteriorly, no W/R/R Abdomen:  Soft and nontender, obese Musculoskeletal:  +left wound vac in place, left leg wrapped, draining minimal fluid. No LE edema Skin:  No rashes or lesions  LABS:  BMET  Recent Labs Lab 06/01/17 0314 06/03/17 0526 06/07/2017 0356  NA 132* 137 137  K 3.7 2.9* 4.8  CL 90* 89* 92*  CO2 34* 40* 38*  BUN 38* 21* 21*  CREATININE 1.03* 0.92 0.76  GLUCOSE 96 121* 88    Electrolytes  Recent Labs Lab 06/07/2017 0249 05/31/17 0557 06/01/17 0314 06/03/17 0526 06/06/2017 0356  CALCIUM 8.5* 8.4* 8.0* 8.3* 8.5*  MG 2.1 2.2  --   --  2.0    CBC  Recent Labs Lab 06/01/17 0408 06/02/17 0310 06/03/17 0526  WBC 21.5* 17.3* 11.6*  HGB 7.9* 8.5* 8.1*   HCT 25.1* 27.5* 27.3*  PLT 232 236 265    Coag's No results for input(s): APTT, INR in the last 168 hours.  Sepsis Markers No results for input(s): LATICACIDVEN, PROCALCITON, O2SATVEN in the last 168 hours.  ABG No results for input(s): PHART, PCO2ART, PO2ART in the last 168 hours.  Liver Enzymes  Recent Labs Lab 06/03/17 0526  AST 32  ALT <5*  ALKPHOS 194*  BILITOT 0.6  ALBUMIN 1.4*    Cardiac Enzymes No results for input(s): TROPONINI, PROBNP in the last 168 hours.  Glucose No results for input(s): GLUCAP in the last 168 hours.  Imaging No results found.   STUDIES:    CULTURES: Surgical wound culture 10/10 >> E coli, Pseudomonas, Morganella morganii, proteus, VRE  ANTIBIOTICS: 10/10 Ancef >> 10/12 10/15 Linezolid >> 10/12 Zosyn >>  SIGNIFICANT EVENTS: 10/5 Left thigh and left leg debridement 10/10 Repeat irrigation and debridement left thigh and left leg 10/12 Debridement left leg and thigh 10/17 Debridement left thigh wound  LINES/TUBES: PIV x3 Foley catheter Negative pressure wound L leg  DISCUSSION: 82 year old female with history of HFpEF, necrotizing fasciitis of left lower leg, HLD, tricuspid regurg, pulmonary hypertension, CKD III, COPD  on 2L at night, presents after repeat left lower extremity debridement with acute hypotension. She was started on neo synephrine and 2u PRBC were given after her procedure. Most likely secondary to hypovolemic shock, though complicated by HFpEF with moderate tricuspid and mitral regurgitation.  ASSESSMENT / PLAN:  PULMONARY A: Pulmonary Hypertension - [facility] 11/2015 with PAH 59/17 mmHg P:   Monitor respiratory status with infusion of 3U PRBC 40 mg lasix daily >> hold in setting of acute hypotension  CARDIOVASCULAR A:  HFpEF (Last echo 05/21/2017 indicates EF 60-65%, moderate MR, mod pulm HTN) with intermittent episodes of hypotension. Paroxysmal Atrial fibrillation CAD Elevated troponin Hypovolemic shock  requiring multiple blood transfusions P:  Continue pressure support with neo  Monitor BP with infusion of 2U PRBC Monitor intake/output Daily weights Continue home atorvastatin 40 mg daily Monitor blood pressure Continue midodrine 10 mg TID Hold Lasix 40 mg daily  RENAL A:   AKI on CKD stage III (baseline Cr 0.8-1.1) Hypokalemia P:   Trend BMP Replete K as needed  GASTROINTESTINAL A:   ?Acute GI bleed P:   GI consulted PPI Colace, miralax PRN constipation Zofran PRN nausea  HEMATOLOGIC A:   Hypovolemic shock requiring multiple blood transfusions Acute blood loss anemia P:  Post-transfusion CBC Check Coag panel GI consulted for GI bleed  INFECTIOUS A:   Surgical wound culture 10/10 >> E coli, Pseudomonas, Morganella morganii, proteus, VRE P:   Continue linezolid (started 10/15), Zosyn (started 10/12) Follow wound cultures Monitor fever curve, leukocytosis  ENDOCRINE A:   Overweight P:   No diagnosis of diabetes; monitor glucose on Bmets  NEUROLOGIC A:   Neurologically intact, no acute process P:   Monitor mental status  FAMILY  - Inter-disciplinary family meet or Palliative Care meeting due by:  06/11/2017   Everrett Coombe, MD PGY-2 [facility]  06/06/2017 3:22 PM

## 2017-06-04 NOTE — H&P (View-Only) (Signed)
Reason for Consult GI bleed shock Referring Physician: Asjah Rauda is an 82 y.o. female.  HPI: asked to see pt emergently for shock and GI bleed from the duodenum.  SHE HAS MULTIPLE MEDICAL PROBLEMS AND JUST UNDERWENT DEBRIDEMENT OF LLE  BY ORTHO FOR INFECTION. She developed acute GI bleeding and endoscopy unsuccessful for control.  She is intubated on multiple pressors and in shock.    Past Medical History:  Diagnosis Date  . A-fib ([place])   . Arthritis    "knees" (11/17/2015)  . CAD (coronary artery disease)    BMS to LAD 07/2005  . Chronic atrial fibrillation ([place])    Since 2011  . Degenerative arthritis of right knee   . Essential hypertension   . GERD (gastroesophageal reflux disease)   . Hyperlipidemia   . Morbid obesity ([place])   . Myocardial infarction ([place]) 07/2005  . On home oxygen therapy    "2.5L at night" (11/17/2015)  . Psoriasis   . Pulmonary hypertension ([facility])    Mixed, PASP 67 mmHg at [facility] 2013  . Right ventricular dysfunction   . Tricuspid regurgitation    Severe    Past Surgical History:  Procedure Laterality Date  . ACHILLES TENDON SURGERY Right 1970s  . CARDIAC CATHETERIZATION N/A 12/07/2015   Procedure: Right Heart Cath;  Surgeon: Jolaine Artist, MD;  Location: [place] [facility];  Service: Cardiovascular;  Laterality: N/A;  . CARPAL TUNNEL RELEASE  11/29/2011   Procedure: CARPAL TUNNEL RELEASE;  Surgeon: Cammie Sickle., MD;  Location: [facility];  Service: Orthopedics;  Laterality: Right;  . COLONOSCOPY    . CORONARY ANGIOPLASTY WITH STENT PLACEMENT  07/2005   Dr. Olevia Perches  . EYE SURGERY    . I&D EXTREMITY Left 05/21/2017   Procedure: IRRIGATION AND DEBRIDEMENT LEFT THIGH AND LEFT LEG;  Surgeon: [name], MD;  Location: [place];  Service: Orthopedics;  Laterality: Left;  . I&D EXTREMITY Left 05/29/2017   Procedure: REPEAT IRRIGATION AND DEBRIDEMENT LEFT THIGH AND LEFT LEG;  Surgeon: [name], MD;  Location: [place];  Service: Orthopedics;  Laterality: Left;  . LAPAROSCOPIC CHOLECYSTECTOMY    . RETINAL DETACHMENT SURGERY Right   . SKIN SPLIT GRAFT Left 06/18/2017   Procedure: DEBRIDEMENT LEFT LEG AND THIGH;  Surgeon: [name], MD;  Location: [place];  Service: Orthopedics;  Laterality: Left;    Family History  Problem Relation Age of Onset  . Heart attack Mother   . Coronary artery disease Father   . Stroke Father   . Heart attack Brother   . Heart failure Brother   . COPD Brother   . Emphysema Sister   . Emphysema Brother     Social History:  reports that she quit smoking about 30 years ago. Her smoking use included Cigarettes. She has a 0.15 pack-year smoking history. She has never used smokeless tobacco. She reports that she does not drink alcohol or use drugs.  Allergies: No Known Allergies  Medications: I have reviewed the patient's current medications.  Results for orders placed or performed during the hospital encounter of 05/31/2017 (from the past 48 hour(s))  CBC     Status: Abnormal   Collection Time: 06/03/17  5:26 AM  Result Value Ref Range   WBC 11.6 (H) 4.0 - 10.5 K/uL   RBC 2.90 (L) 3.87 - 5.11 MIL/uL   Hemoglobin 8.1 (L) 12.0 - 15.0 g/dL   HCT 27.3 (L) 36.0 -  46.0 %   MCV 94.1 78.0 - 100.0 fL   MCH 27.9 26.0 - 34.0 pg   MCHC 29.7 (L) 30.0 - 36.0 g/dL   RDW 16.4 (H) 11.5 - 15.5 %   Platelets 265 150 - 400 K/uL  Comprehensive metabolic panel     Status: Abnormal   Collection Time: 06/03/17  5:26 AM  Result Value Ref Range   Sodium 137 135 - 145 mmol/L   Potassium 2.9 (L) 3.5 - 5.1 mmol/L   Chloride 89 (L) 101 - 111 mmol/L   CO2 40 (H) 22 - 32 mmol/L   Glucose, Bld 121 (H) 65 - 99 mg/dL   BUN 21 (H) 6 - 20 mg/dL   Creatinine, Ser 0.92 0.44 - 1.00 mg/dL   Calcium 8.3 (L) 8.9 - 10.3 mg/dL   Total Protein 4.4 (L) 6.5 - 8.1 g/dL   Albumin 1.4 (L) 3.5 - 5.0 g/dL   AST 32 15 - 41 U/L   ALT <5 (L) 14 - 54 U/L   Alkaline Phosphatase 194 (H) 38 - 126 U/L   Total  Bilirubin 0.6 0.3 - 1.2 mg/dL   GFR calc non Af Amer 57 (L) >60 mL/min   GFR calc Af Amer >60 >60 mL/min    Comment: (NOTE) The eGFR has been calculated using the CKD EPI equation. This calculation has not been validated in all clinical situations. eGFR's persistently <60 mL/min signify possible Chronic Kidney Disease.    Anion gap 8 5 - 15  Basic metabolic panel     Status: Abnormal   Collection Time: 05/27/2017  3:56 AM  Result Value Ref Range   Sodium 137 135 - 145 mmol/L   Potassium 4.8 3.5 - 5.1 mmol/L    Comment: DELTA CHECK NOTED NO VISIBLE HEMOLYSIS    Chloride 92 (L) 101 - 111 mmol/L   CO2 38 (H) 22 - 32 mmol/L   Glucose, Bld 88 65 - 99 mg/dL   BUN 21 (H) 6 - 20 mg/dL   Creatinine, Ser 0.76 0.44 - 1.00 mg/dL   Calcium 8.5 (L) 8.9 - 10.3 mg/dL   GFR calc non Af Amer >60 >60 mL/min   GFR calc Af Amer >60 >60 mL/min    Comment: (NOTE) The eGFR has been calculated using the CKD EPI equation. This calculation has not been validated in all clinical situations. eGFR's persistently <60 mL/min signify possible Chronic Kidney Disease.    Anion gap 7 5 - 15  Magnesium     Status: None   Collection Time: 06/01/2017  3:56 AM  Result Value Ref Range   Magnesium 2.0 1.7 - 2.4 mg/dL  Type and screen [place]     Status: None (Preliminary result)   Collection Time: 05/21/2017 11:46 AM  Result Value Ref Range   ABO/RH(D) B NEG    Antibody Screen NEG    Sample Expiration 06/07/2017    Unit Number [id number]    Blood Component Type RED CELLS,LR    Unit division 00    Status of Unit ISSUED    Unit tag comment VERBAL ORDERS PER DR [facility]    Transfusion Status OK TO TRANSFUSE    Crossmatch Result COMPATIBLE    Unit Number [id number]    Blood Component Type RED CELLS,LR    Unit division 00    Status of Unit ISSUED    Unit tag comment VERBAL ORDERS PER DR Vandling    Transfusion Status OK TO TRANSFUSE    Crossmatch  Result COMPATIBLE    Unit Number  [id number]    Blood Component Type RBC LR PHER1    Unit division 00    Status of Unit ISSUED    Transfusion Status OK TO TRANSFUSE    Crossmatch Result Compatible    Unit Number [id number]    Blood Component Type RED CELLS,LR    Unit division 00    Status of Unit ISSUED    Transfusion Status OK TO TRANSFUSE    Crossmatch Result Compatible    Unit Number [id number]    Blood Component Type RED CELLS,LR    Unit division 00    Status of Unit ISSUED    Transfusion Status OK TO TRANSFUSE    Crossmatch Result Compatible    Unit Number [id number]    Blood Component Type RED CELLS,LR    Unit division 00    Status of Unit ISSUED    Transfusion Status OK TO TRANSFUSE    Crossmatch Result Compatible    Unit Number [id number]    Blood Component Type RED CELLS,LR    Unit division 00    Status of Unit ISSUED    Transfusion Status OK TO TRANSFUSE    Crossmatch Result Compatible    Unit Number [id number]    Blood Component Type RED CELLS,LR    Unit division 00    Status of Unit ALLOCATED    Transfusion Status OK TO TRANSFUSE    Crossmatch Result COMPATIBLE    Unit Number [id number]    Blood Component Type RED CELLS,LR    Unit division 00    Status of Unit ALLOCATED    Transfusion Status OK TO TRANSFUSE    Crossmatch Result COMPATIBLE   Prepare RBC     Status: None   Collection Time: 06/05/2017  1:10 PM  Result Value Ref Range   Order Confirmation ORDER PROCESSED BY BLOOD BANK   I-STAT 7, (LYTES, BLD GAS, ICA, H+H)     Status: Abnormal   Collection Time: 06/05/2017  3:18 PM  Result Value Ref Range   pH, Arterial 7.257 (L) 7.350 - 7.450   pCO2 arterial 61.4 (H) 32.0 - 48.0 mmHg   pO2, Arterial 77.0 (L) 83.0 - 108.0 mmHg   Bicarbonate 27.4 20.0 - 28.0 mmol/L   TCO2 29 22 - 32 mmol/L   O2 Saturation 93.0 %   Sodium 142 135 - 145 mmol/L   Potassium 4.8 3.5 - 5.1 mmol/L   Calcium, Ion 1.03 (L) 1.15 - 1.40 mmol/L   HCT 21.0 (L) 36.0 - 46.0 %   Hemoglobin 7.1 (L)  12.0 - 15.0 g/dL   Patient temperature HIDE    Sample type ARTERIAL   CBC     Status: Abnormal   Collection Time: 06/09/2017  3:20 PM  Result Value Ref Range   WBC 21.3 (H) 4.0 - 10.5 K/uL   RBC 2.72 (L) 3.87 - 5.11 MIL/uL   Hemoglobin 8.0 (L) 12.0 - 15.0 g/dL   HCT 25.1 (L) 36.0 - 46.0 %   MCV 92.3 78.0 - 100.0 fL   MCH 29.4 26.0 - 34.0 pg   MCHC 31.9 30.0 - 36.0 g/dL   RDW 16.1 (H) 11.5 - 15.5 %   Platelets 198 150 - 400 K/uL  Basic metabolic panel     Status: Abnormal   Collection Time: 05/20/2017  3:20 PM  Result Value Ref Range   Sodium 139 135 - 145 mmol/L   Potassium 4.9 3.5 - 5.1 mmol/L   Chloride 105  101 - 111 mmol/L   CO2 25 22 - 32 mmol/L   Glucose, Bld 116 (H) 65 - 99 mg/dL   BUN 24 (H) 6 - 20 mg/dL   Creatinine, Ser 0.97 0.44 - 1.00 mg/dL   Calcium 6.8 (L) 8.9 - 10.3 mg/dL   GFR calc non Af Amer 54 (L) >60 mL/min   GFR calc Af Amer >60 >60 mL/min    Comment: (NOTE) The eGFR has been calculated using the CKD EPI equation. This calculation has not been validated in all clinical situations. eGFR's persistently <60 mL/min signify possible Chronic Kidney Disease.    Anion gap 9 5 - 15  Troponin I     Status: Abnormal   Collection Time: 05/31/2017  3:20 PM  Result Value Ref Range   Troponin I 0.04 (HH) <0.03 ng/mL    Comment: CRITICAL RESULT CALLED TO, READ BACK BY AND VERIFIED WITH: Shanon Rosser RN [id number] GREEN R   Fibrinogen     Status: None   Collection Time: 05/27/2017  3:30 PM  Result Value Ref Range   Fibrinogen 384 210 - 475 mg/dL  Protime-INR     Status: Abnormal   Collection Time: 06/02/2017  3:30 PM  Result Value Ref Range   Prothrombin Time 17.4 (H) 11.4 - 15.2 seconds   INR 1.44   Prepare RBC     Status: None   Collection Time: 05/25/2017  3:37 PM  Result Value Ref Range   Order Confirmation ORDER PROCESSED BY BLOOD BANK     Dg Abd Portable 1v  Result Date: 05/27/2017 CLINICAL DATA:  Check nasogastric catheter placement EXAM: PORTABLE ABDOMEN - 1  VIEW COMPARISON:  None. FINDINGS: Scattered large and small bowel gas is noted. Nasogastric catheter is noted coiled within the stomach. Cardiomegaly is seen. Diffuse degenerative changes of the thoracolumbar spine are noted with scoliosis concave to the right. IMPRESSION: Nasogastric catheter within the stomach. Electronically Signed   By: Inez Catalina M.D.   On: 06/08/2017 18:08    Review of Systems  Unable to perform ROS: Acuity of condition   Blood pressure (!) (P) 56/37, pulse (!) 43, temperature 97.6 F (36.4 C), temperature source Temporal, resp. rate 20, height 5' 2.99" (1.6 m), weight 98.9 kg (218 lb), SpO2 (!) 64 %. Physical Exam  Constitutional: She appears distressed. She is intubated.  Cardiovascular:  Sinus tachycardia 150   Respiratory: She is intubated.  Intubated   GI: Soft. She exhibits distension. There is no tenderness. There is no rebound.  Musculoskeletal:  LLE wound vac   Neurological: She is unresponsive.    Assessment/Plan: hemorhagic shock from duodenal ulcer bleeding Attempts at left Lucas and Left IJ UNSUCCESSFUL FOR LINE PLACEMENT due to severe hypovolemia  Continue to use peripheral IV for now for blood products and place line  Once stabilized will take to OR for ex lap  High risk of mortality with or without surgery Family en route but this will need to be done emergently for her to have any chance of survival.  The procedure has been discussed with the patient.  Alternative therapies have been discussed with the patient.  Operative risks include bleeding,  Infection,  Organ injury,  Nerve injury,  Blood vessel injury,  DVT,  Pulmonary embolism,  Death,  And possible reoperation.  Medical management risks include worsening of present situation.  The success of the procedure is 50 -90 % at treating patients symptoms.  The patient understands and agrees to proceed. Trenace Coughlin A.  06/03/2017, 8:36 PM

## 2017-06-04 NOTE — Op Note (Signed)
PREOPERATIVE DIAGNOSIS: Bleeding duodenal ulcer and hemorrhagic shock  Postoperative diagnosis: Same  Procedure: Exploratory laparotomy with pyloroplasty and oversewing bleeding duodenal ulcer  Surgeon: Erroll Luna M.D.  Anesthesia: Gen.  EBL: 4000 mL  IV fluids: 7 units packed cells 4 L crystalloid 500 mL albumin  Drains: 19 round drain to gallbladder fossa  Specimens: Tissue from ulcer sent to pathology  Indications for procedure: Patient is an 82 year old female with multiple medical problems who is admitted for debridement of a chronic left lower extremity infection bleeding. She underwent debridement today the operating room by orthopedic surgery. In the recovery room she began to have signs of GI bleeding with emesis and bright red blood per rectum. She's taken to the ICU and resuscitated. GI medicine was consulted and upper endoscopy revealed a large posterior bleeding duodenal ulcer. Attempts were made to control the bleeding but this was not possible endoscopically. She was in severe hemorrhagic shock point of expiring. She received the ICU and multiple attempts at IV access were unsuccessful. She was intubated. She was stabilized by giving blood products and fluids as well as vasopressors in the ICU via peripheral IVs. When she stabilized was brought to the operating room. Placed upon the OR table. I discussed the care with her husband on the phone. I explained the entire situation and the fact she had a high risk of dying and having complications from surgery. I explained that without surgery she would not survive due to exsanguination. He agreed to proceed understanding the potential risk and high risk of mortality and complications in the situation.  Description of procedure the patient was brought from the ICU. She was oriented intubated. She was placed supine on the operating room. Resume prepped and draped in a sterile fashion after adequate levels of anesthesia were initiated.  She remained hypertensive during motion case. Upper midline incision was used. We quickly gained access the abdominal cavity and placed a retractor. Her stomach was massively distended. Nasogastric tube was replaced. I then did a Kocher maneuver to mobilize the duodenum. The duodenum was visualized in a longitudinal incision was made over the pylorus to open the pylorus and bulb of duodenum. There is a large amount of blood in the stomach and duodenum. The posterior wall was a pulsating vessel which oversewed with 2-0 Vicryl. Care was taken to avoid the common bile duct. She also had perforated duodenum as well involving the superior wall of the bulb the duodenum. There is a large perforation with active extravasation. This is controlled with cautery. Three fourths of the duodenal wall this point time exposed. I opened this further. This exposed the ulcer base and additional sutures were placed into the ulcer with 2-0 Vicryl. The pyloromyotomy was quite large was closed with 2-0 Vicryl making sure not to narrow the duodenum. This was done with interrupted 2-0 Vicryl. The nasogastric tube was situated stomach and secured. Irrigation was used and suctioned out. The area where the duodenum was kocherized was examined and found to be hemostatic. All sponges were counted and found to be correct. Inspection of the abdominal cavity revealed no retained sponges. The fascia was closed with double-stranded #1 PDS. Subcutaneous tissues were packed with saline soaked gauze and dry dressings were applied. The patient remained intubated and was taken back to the ICU in critical condition.

## 2017-06-04 NOTE — Consult Note (Signed)
Reason for Consult GI bleed shock Referring Physician: Jacobs  [name] is an 82 y.o. female.  HPI: asked to see pt emergently for shock and GI bleed from the duodenum.  SHE HAS MULTIPLE MEDICAL PROBLEMS AND JUST UNDERWENT DEBRIDEMENT OF LLE  BY ORTHO FOR INFECTION. She developed acute GI bleeding and endoscopy unsuccessful for control.  She is intubated on multiple pressors and in shock.    Past Medical History:  Diagnosis Date  . A-fib (HCC)   . Arthritis    "knees" (11/17/2015)  . CAD (coronary artery disease)    BMS to LAD 07/2005  . Chronic atrial fibrillation (HCC)    Since 2011  . Degenerative arthritis of right knee   . Essential hypertension   . GERD (gastroesophageal reflux disease)   . Hyperlipidemia   . Morbid obesity (HCC)   . Myocardial infarction (HCC) 07/2005  . On home oxygen therapy    "2.5L at night" (11/17/2015)  . Psoriasis   . Pulmonary hypertension (HCC)    Mixed, PASP 67 mmHg at [facility] 2013  . Right ventricular dysfunction   . Tricuspid regurgitation    Severe    Past Surgical History:  Procedure Laterality Date  . ACHILLES TENDON SURGERY Right 1970s  . CARDIAC CATHETERIZATION N/A 12/07/2015   Procedure: Right Heart Cath;  Surgeon: Daniel R Bensimhon, MD;  Location: [facility];  Service: Cardiovascular;  Laterality: N/A;  . CARPAL TUNNEL RELEASE  11/29/2011   Procedure: CARPAL TUNNEL RELEASE;  Surgeon: Robert V Sypher Jr., MD;  Location: [facility];  Service: Orthopedics;  Laterality: Right;  . COLONOSCOPY    . CORONARY ANGIOPLASTY WITH STENT PLACEMENT  07/2005   Dr. Brodie  . EYE SURGERY    . I&D EXTREMITY Left 06/11/2017   Procedure: IRRIGATION AND DEBRIDEMENT LEFT THIGH AND LEFT LEG;  Surgeon: Duda, Marcus V, MD;  Location: [facility] OR;  Service: Orthopedics;  Laterality: Left;  . I&D EXTREMITY Left 06/15/2017   Procedure: REPEAT IRRIGATION AND DEBRIDEMENT LEFT THIGH AND LEFT LEG;  Surgeon: Duda, Marcus V, MD;  Location: [facility]  OR;  Service: Orthopedics;  Laterality: Left;  . LAPAROSCOPIC CHOLECYSTECTOMY    . RETINAL DETACHMENT SURGERY Right   . SKIN SPLIT GRAFT Left 05/22/2017   Procedure: DEBRIDEMENT LEFT LEG AND THIGH;  Surgeon: Duda, Marcus V, MD;  Location: [facility] OR;  Service: Orthopedics;  Laterality: Left;    Family History  Problem Relation Age of Onset  . Heart attack Mother   . Coronary artery disease Father   . Stroke Father   . Heart attack Brother   . Heart failure Brother   . COPD Brother   . Emphysema Sister   . Emphysema Brother     Social History:  reports that she quit smoking about 30 years ago. Her smoking use included Cigarettes. She has a 0.15 pack-year smoking history. She has never used smokeless tobacco. She reports that she does not drink alcohol or use drugs.  Allergies: No Known Allergies  Medications: I have reviewed the patient's current medications.  Results for orders placed or performed during the hospital encounter of 05/27/2017 (from the past 48 hour(s))  CBC     Status: Abnormal   Collection Time: 06/03/17  5:26 AM  Result Value Ref Range   WBC 11.6 (H) 4.0 - 10.5 K/uL   RBC 2.90 (L) 3.87 - 5.11 MIL/uL   Hemoglobin 8.1 (L) 12.0 - 15.0 g/dL   HCT 27.3 (L) 36.0 -   46.0 %   MCV 94.1 78.0 - 100.0 fL   MCH 27.9 26.0 - 34.0 pg   MCHC 29.7 (L) 30.0 - 36.0 g/dL   RDW 16.4 (H) 11.5 - 15.5 %   Platelets 265 150 - 400 K/uL  Comprehensive metabolic panel     Status: Abnormal   Collection Time: 06/03/17  5:26 AM  Result Value Ref Range   Sodium 137 135 - 145 mmol/L   Potassium 2.9 (L) 3.5 - 5.1 mmol/L   Chloride 89 (L) 101 - 111 mmol/L   CO2 40 (H) 22 - 32 mmol/L   Glucose, Bld 121 (H) 65 - 99 mg/dL   BUN 21 (H) 6 - 20 mg/dL   Creatinine, Ser 0.92 0.44 - 1.00 mg/dL   Calcium 8.3 (L) 8.9 - 10.3 mg/dL   Total Protein 4.4 (L) 6.5 - 8.1 g/dL   Albumin 1.4 (L) 3.5 - 5.0 g/dL   AST 32 15 - 41 U/L   ALT <5 (L) 14 - 54 U/L   Alkaline Phosphatase 194 (H) 38 - 126 U/L   Total  Bilirubin 0.6 0.3 - 1.2 mg/dL   GFR calc non Af Amer 57 (L) >60 mL/min   GFR calc Af Amer >60 >60 mL/min    Comment: (NOTE) The eGFR has been calculated using the CKD EPI equation. This calculation has not been validated in all clinical situations. eGFR's persistently <60 mL/min signify possible Chronic Kidney Disease.    Anion gap 8 5 - 15  Basic metabolic panel     Status: Abnormal   Collection Time: 06/15/2017  3:56 AM  Result Value Ref Range   Sodium 137 135 - 145 mmol/L   Potassium 4.8 3.5 - 5.1 mmol/L    Comment: DELTA CHECK NOTED NO VISIBLE HEMOLYSIS    Chloride 92 (L) 101 - 111 mmol/L   CO2 38 (H) 22 - 32 mmol/L   Glucose, Bld 88 65 - 99 mg/dL   BUN 21 (H) 6 - 20 mg/dL   Creatinine, Ser 0.76 0.44 - 1.00 mg/dL   Calcium 8.5 (L) 8.9 - 10.3 mg/dL   GFR calc non Af Amer >60 >60 mL/min   GFR calc Af Amer >60 >60 mL/min    Comment: (NOTE) The eGFR has been calculated using the CKD EPI equation. This calculation has not been validated in all clinical situations. eGFR's persistently <60 mL/min signify possible Chronic Kidney Disease.    Anion gap 7 5 - 15  Magnesium     Status: None   Collection Time: 05/27/2017  3:56 AM  Result Value Ref Range   Magnesium 2.0 1.7 - 2.4 mg/dL  Type and screen [facility]     Status: None (Preliminary result)   Collection Time: 05/22/2017 11:46 AM  Result Value Ref Range   ABO/RH(D) B NEG    Antibody Screen NEG    Sample Expiration 06/07/2017    Unit Number [id number]    Blood Component Type RED CELLS,LR    Unit division 00    Status of Unit ISSUED    Unit tag comment VERBAL ORDERS PER DR GERMEROUTH    Transfusion Status OK TO TRANSFUSE    Crossmatch Result COMPATIBLE    Unit Number [id number]    Blood Component Type RED CELLS,LR    Unit division 00    Status of Unit ISSUED    Unit tag comment VERBAL ORDERS PER DR GERMEROUTH    Transfusion Status OK TO TRANSFUSE    Crossmatch   Result COMPATIBLE    Unit Number  [id number]    Blood Component Type RBC LR PHER1    Unit division 00    Status of Unit ISSUED    Transfusion Status OK TO TRANSFUSE    Crossmatch Result Compatible    Unit Number [id number]    Blood Component Type RED CELLS,LR    Unit division 00    Status of Unit ISSUED    Transfusion Status OK TO TRANSFUSE    Crossmatch Result Compatible    Unit Number [id number]    Blood Component Type RED CELLS,LR    Unit division 00    Status of Unit ISSUED    Transfusion Status OK TO TRANSFUSE    Crossmatch Result Compatible    Unit Number [id number]    Blood Component Type RED CELLS,LR    Unit division 00    Status of Unit ISSUED    Transfusion Status OK TO TRANSFUSE    Crossmatch Result Compatible    Unit Number [id number]    Blood Component Type RED CELLS,LR    Unit division 00    Status of Unit ISSUED    Transfusion Status OK TO TRANSFUSE    Crossmatch Result Compatible    Unit Number [id number]    Blood Component Type RED CELLS,LR    Unit division 00    Status of Unit ALLOCATED    Transfusion Status OK TO TRANSFUSE    Crossmatch Result COMPATIBLE    Unit Number [id number]    Blood Component Type RED CELLS,LR    Unit division 00    Status of Unit ALLOCATED    Transfusion Status OK TO TRANSFUSE    Crossmatch Result COMPATIBLE   Prepare RBC     Status: None   Collection Time: 06/03/2017  1:10 PM  Result Value Ref Range   Order Confirmation ORDER PROCESSED BY BLOOD BANK   I-STAT 7, (LYTES, BLD GAS, ICA, H+H)     Status: Abnormal   Collection Time: 05/20/2017  3:18 PM  Result Value Ref Range   pH, Arterial 7.257 (L) 7.350 - 7.450   pCO2 arterial 61.4 (H) 32.0 - 48.0 mmHg   pO2, Arterial 77.0 (L) 83.0 - 108.0 mmHg   Bicarbonate 27.4 20.0 - 28.0 mmol/L   TCO2 29 22 - 32 mmol/L   O2 Saturation 93.0 %   Sodium 142 135 - 145 mmol/L   Potassium 4.8 3.5 - 5.1 mmol/L   Calcium, Ion 1.03 (L) 1.15 - 1.40 mmol/L   HCT 21.0 (L) 36.0 - 46.0 %   Hemoglobin 7.1 (L)  12.0 - 15.0 g/dL   Patient temperature HIDE    Sample type ARTERIAL   CBC     Status: Abnormal   Collection Time: 06/15/2017  3:20 PM  Result Value Ref Range   WBC 21.3 (H) 4.0 - 10.5 K/uL   RBC 2.72 (L) 3.87 - 5.11 MIL/uL   Hemoglobin 8.0 (L) 12.0 - 15.0 g/dL   HCT 25.1 (L) 36.0 - 46.0 %   MCV 92.3 78.0 - 100.0 fL   MCH 29.4 26.0 - 34.0 pg   MCHC 31.9 30.0 - 36.0 g/dL   RDW 16.1 (H) 11.5 - 15.5 %   Platelets 198 150 - 400 K/uL  Basic metabolic panel     Status: Abnormal   Collection Time: 06/15/2017  3:20 PM  Result Value Ref Range   Sodium 139 135 - 145 mmol/L   Potassium 4.9 3.5 - 5.1 mmol/L   Chloride 105   101 - 111 mmol/L   CO2 25 22 - 32 mmol/L   Glucose, Bld 116 (H) 65 - 99 mg/dL   BUN 24 (H) 6 - 20 mg/dL   Creatinine, Ser 0.97 0.44 - 1.00 mg/dL   Calcium 6.8 (L) 8.9 - 10.3 mg/dL   GFR calc non Af Amer 54 (L) >60 mL/min   GFR calc Af Amer >60 >60 mL/min    Comment: (NOTE) The eGFR has been calculated using the CKD EPI equation. This calculation has not been validated in all clinical situations. eGFR's persistently <60 mL/min signify possible Chronic Kidney Disease.    Anion gap 9 5 - 15  Troponin I     Status: Abnormal   Collection Time: 06/18/2017  3:20 PM  Result Value Ref Range   Troponin I 0.04 (HH) <0.03 ng/mL    Comment: CRITICAL RESULT CALLED TO, READ BACK BY AND VERIFIED WITH: L. REYNOLDS RN 05/22/2017 1627 GREEN R   Fibrinogen     Status: None   Collection Time: 05/25/2017  3:30 PM  Result Value Ref Range   Fibrinogen 384 210 - 475 mg/dL  Protime-INR     Status: Abnormal   Collection Time: 06/14/2017  3:30 PM  Result Value Ref Range   Prothrombin Time 17.4 (H) 11.4 - 15.2 seconds   INR 1.44   Prepare RBC     Status: None   Collection Time: 06/11/2017  3:37 PM  Result Value Ref Range   Order Confirmation ORDER PROCESSED BY BLOOD BANK     Dg Abd Portable 1v  Result Date: 06/11/2017 CLINICAL DATA:  Check nasogastric catheter placement EXAM: PORTABLE ABDOMEN - 1  VIEW COMPARISON:  None. FINDINGS: Scattered large and small bowel gas is noted. Nasogastric catheter is noted coiled within the stomach. Cardiomegaly is seen. Diffuse degenerative changes of the thoracolumbar spine are noted with scoliosis concave to the right. IMPRESSION: Nasogastric catheter within the stomach. Electronically Signed   By: Mark  Lukens M.D.   On: 05/29/2017 18:08    Review of Systems  Unable to perform ROS: Acuity of condition   Blood pressure (!) (P) 56/37, pulse (!) 43, temperature 97.6 F (36.4 C), temperature source Temporal, resp. rate 20, height 5' 2.99" (1.6 m), weight 98.9 kg (218 lb), SpO2 (!) 64 %. Physical Exam  Constitutional: She appears distressed. She is intubated.  Cardiovascular:  Sinus tachycardia 150   Respiratory: She is intubated.  Intubated   GI: Soft. She exhibits distension. There is no tenderness. There is no rebound.  Musculoskeletal:  LLE wound vac   Neurological: She is unresponsive.    Assessment/Plan: hemorhagic shock from duodenal ulcer bleeding Attempts at left [place] and Left IJ UNSUCCESSFUL FOR LINE PLACEMENT due to severe hypovolemia  Continue to use peripheral IV for now for blood products and place line  Once stabilized will take to OR for ex lap  High risk of mortality with or without surgery Family en route but this will need to be done emergently for her to have any chance of survival.  The procedure has been discussed with the patient.  Alternative therapies have been discussed with the patient.  Operative risks include bleeding,  Infection,  Organ injury,  Nerve injury,  Blood vessel injury,  DVT,  Pulmonary embolism,  Death,  And possible reoperation.  Medical management risks include worsening of present situation.  The success of the procedure is 50 -90 % at treating patients symptoms.  The patient understands and agrees to proceed. Alessandra Sawdey A.   06/11/2017, 8:36 PM     

## 2017-06-04 NOTE — Significant Event (Addendum)
CRITICAL CARE  Called to bedside by E-Link at 8:00 pm  Pt pale hypotensive despite maxed on Neo and Levo with 1L crystalloid IVF. Pt's A- lin was reading systolics <49. Surgeon Cornett MD at the bedside. Pt not responsive very poor tracing on previously good A-line and no pulses felt compressions started and Epi x1 ordered.During compressions patient moved arms very thready pulse 1 amp of bicarb ordered. Pt intubated emergently for airway protection and acute hypoxemic failure. No RSI meds were given during. ETT placed in 1 attempt oropharynx filled with bright red blood. Most likely aspirated.  Pt remained hypotensive given push dose epi started on Epi ggt and Vasopressin. Titrated up Epi and titrated off Neo. Given 25% albumin and 2 units PRBCs type specific.  Pt became more awake and received Fentanyl 51mcg.   Surgeon attempted subclavian without success- patient given another dose of Fentanyl 50 mcg I attempted to get central venous access but even in Trendelenberg patient's veins flat can't get into lumen. Pt has 3 peripheral 18, 22,20-> 20 in the left hand appeared infiltrated so it was discontinued prior to OR. Pt's drips were minimally interrupted during consolidation of IVs by OR nursing staff. Istat ABG on vent settings: TV 6cc/kg RR 20 Fio2 100% PEEP 5-> 7.276/47.7/380/21 Hgb on ISTAT was 9.5 OR nurse stated that pt was hypotensive into 50s on their way to OR-> Calcium IV, 1 epi and 1 bicarb given  CRITICAL CARE TIME: 90 minutes CODE STATUS: FULL Prognosis: Guarded Family: Spoke with Husband at 9:25 pm and explained critical condition and prognosis and he expressed understanding.  Critical care time was exclusive of separately billable procedures and treating other patients. Critical care was necessary to treat or prevent imminent or life-threatening deterioration. Critical care was time spent personally by me on the following activities: development of treatment plan with patient and/or  surrogate as well as nursing, discussions with consultants, evaluation of patient's response to treatment, examination of patient, obtaining history from patient or surrogate, ordering and performing treatments and interventions, ordering and review of laboratory studies, ordering and review of radiographic studies, pulse oximetry and re-evaluation of patient's condition.    Performed by:  Dr Seward Carol [facility]

## 2017-06-05 ENCOUNTER — Inpatient Hospital Stay (HOSPITAL_COMMUNITY): Payer: Medicare Other

## 2017-06-05 ENCOUNTER — Encounter (HOSPITAL_COMMUNITY): Payer: Self-pay | Admitting: Orthopedic Surgery

## 2017-06-05 ENCOUNTER — Other Ambulatory Visit: Payer: Self-pay | Admitting: *Deleted

## 2017-06-05 DIAGNOSIS — N179 Acute kidney failure, unspecified: Secondary | ICD-10-CM

## 2017-06-05 DIAGNOSIS — K922 Gastrointestinal hemorrhage, unspecified: Secondary | ICD-10-CM

## 2017-06-05 DIAGNOSIS — K263 Acute duodenal ulcer without hemorrhage or perforation: Secondary | ICD-10-CM

## 2017-06-05 LAB — POCT I-STAT 3, ART BLOOD GAS (G3+)
ACID-BASE DEFICIT: 4 mmol/L — AB (ref 0.0–2.0)
Acid-Base Excess: 28 mmol/L — ABNORMAL HIGH (ref 0.0–2.0)
Acid-Base Excess: 1 mmol/L (ref 0.0–2.0)
Acid-base deficit: 15 mmol/L — ABNORMAL HIGH (ref 0.0–2.0)
BICARBONATE: 14.5 mmol/L — AB (ref 20.0–28.0)
BICARBONATE: 19.8 mmol/L — AB (ref 20.0–28.0)
Bicarbonate: 56.3 mmol/L — ABNORMAL HIGH (ref 20.0–28.0)
Bicarbonate: 25.2 mmol/L (ref 20.0–28.0)
O2 SAT: 90 %
O2 Saturation: 93 %
O2 Saturation: 89 %
O2 Saturation: 93 %
PCO2 ART: 88.1 mmHg — AB (ref 32.0–48.0)
PCO2 ART: 45.6 mmHg (ref 32.0–48.0)
PH ART: 7.412 (ref 7.350–7.450)
PH ART: 7.11 — AB (ref 7.350–7.450)
PH ART: 7.438 (ref 7.350–7.450)
PO2 ART: 76 mmHg — AB (ref 83.0–108.0)
TCO2: >50 mmol/L — ABNORMAL HIGH (ref 22–32)
TCO2: 16 mmol/L — AB (ref 22–32)
TCO2: 21 mmol/L — ABNORMAL LOW (ref 22–32)
TCO2: 26 mmol/L (ref 22–32)
pCO2 arterial: 32.4 mmHg (ref 32.0–48.0)
pCO2 arterial: 37.3 mmHg (ref 32.0–48.0)
pH, Arterial: 7.394 (ref 7.350–7.450)
pO2, Arterial: 72 mmHg — ABNORMAL LOW (ref 83.0–108.0)
pO2, Arterial: 58 mmHg — ABNORMAL LOW (ref 83.0–108.0)
pO2, Arterial: 65 mmHg — ABNORMAL LOW (ref 83.0–108.0)

## 2017-06-05 LAB — BASIC METABOLIC PANEL
Anion gap: 12 (ref 5–15)
Anion gap: 9 (ref 5–15)
Anion gap: 12 (ref 5–15)
Anion gap: 10 (ref 5–15)
BUN: 27 mg/dL — AB (ref 6–20)
BUN: 27 mg/dL — AB (ref 6–20)
BUN: 29 mg/dL — ABNORMAL HIGH (ref 6–20)
BUN: 32 mg/dL — ABNORMAL HIGH (ref 6–20)
CALCIUM: 7.5 mg/dL — AB (ref 8.9–10.3)
CHLORIDE: 110 mmol/L (ref 101–111)
CHLORIDE: 111 mmol/L (ref 101–111)
CHLORIDE: 105 mmol/L (ref 101–111)
CHLORIDE: 104 mmol/L (ref 101–111)
CO2: 15 mmol/L — AB (ref 22–32)
CO2: 20 mmol/L — AB (ref 22–32)
CO2: 20 mmol/L — ABNORMAL LOW (ref 22–32)
CO2: 25 mmol/L (ref 22–32)
CREATININE: 1.37 mg/dL — AB (ref 0.44–1.00)
CREATININE: 1.34 mg/dL — AB (ref 0.44–1.00)
CREATININE: 1.45 mg/dL — AB (ref 0.44–1.00)
CREATININE: 1.52 mg/dL — AB (ref 0.44–1.00)
Calcium: 8.9 mg/dL (ref 8.9–10.3)
Calcium: 7.4 mg/dL — ABNORMAL LOW (ref 8.9–10.3)
Calcium: 7.8 mg/dL — ABNORMAL LOW (ref 8.9–10.3)
GFR calc Af Amer: 41 mL/min — ABNORMAL LOW (ref >60)
GFR calc Af Amer: 42 mL/min — ABNORMAL LOW (ref >60)
GFR calc non Af Amer: 35 mL/min — ABNORMAL LOW (ref >60)
GFR calc non Af Amer: 36 mL/min — ABNORMAL LOW (ref >60)
GFR calc non Af Amer: 33 mL/min — ABNORMAL LOW (ref >60)
GFR, EST AFRICAN AMERICAN: 38 mL/min — AB (ref >60)
GFR, EST AFRICAN AMERICAN: 36 mL/min — AB (ref >60)
GFR, EST NON AFRICAN AMERICAN: 31 mL/min — AB (ref >60)
GLUCOSE: 222 mg/dL — AB (ref 65–99)
Glucose, Bld: 204 mg/dL — ABNORMAL HIGH (ref 65–99)
Glucose, Bld: 219 mg/dL — ABNORMAL HIGH (ref 65–99)
Glucose, Bld: 193 mg/dL — ABNORMAL HIGH (ref 65–99)
Potassium: 4.3 mmol/L (ref 3.5–5.1)
Potassium: 4.1 mmol/L (ref 3.5–5.1)
Potassium: 4.1 mmol/L (ref 3.5–5.1)
Potassium: 3.4 mmol/L — ABNORMAL LOW (ref 3.5–5.1)
SODIUM: 137 mmol/L (ref 135–145)
SODIUM: 139 mmol/L (ref 135–145)
Sodium: 137 mmol/L (ref 135–145)
Sodium: 140 mmol/L (ref 135–145)

## 2017-06-05 LAB — CBC
HCT: 28.7 % — ABNORMAL LOW (ref 36.0–46.0)
HEMATOCRIT: 47.9 % — AB (ref 36.0–46.0)
HEMATOCRIT: 43.6 % (ref 36.0–46.0)
HEMATOCRIT: 38.5 % (ref 36.0–46.0)
HEMATOCRIT: 39.7 % (ref 36.0–46.0)
HEMATOCRIT: 27.9 % — AB (ref 36.0–46.0)
HEMOGLOBIN: 16.4 g/dL — AB (ref 12.0–15.0)
HEMOGLOBIN: 13.1 g/dL (ref 12.0–15.0)
HEMOGLOBIN: 10 g/dL — AB (ref 12.0–15.0)
HEMOGLOBIN: 9.8 g/dL — AB (ref 12.0–15.0)
Hemoglobin: 14.7 g/dL (ref 12.0–15.0)
Hemoglobin: 13.7 g/dL (ref 12.0–15.0)
MCH: 30.4 pg (ref 26.0–34.0)
MCH: 29.9 pg (ref 26.0–34.0)
MCH: 29.5 pg (ref 26.0–34.0)
MCH: 29.7 pg (ref 26.0–34.0)
MCH: 29.9 pg (ref 26.0–34.0)
MCH: 30.1 pg (ref 26.0–34.0)
MCHC: 34.2 g/dL (ref 30.0–36.0)
MCHC: 33.7 g/dL (ref 30.0–36.0)
MCHC: 34 g/dL (ref 30.0–36.0)
MCHC: 34.5 g/dL (ref 30.0–36.0)
MCHC: 34.8 g/dL (ref 30.0–36.0)
MCHC: 35.1 g/dL (ref 30.0–36.0)
MCV: 88.9 fL (ref 78.0–100.0)
MCV: 88.8 fL (ref 78.0–100.0)
MCV: 86.7 fL (ref 78.0–100.0)
MCV: 86.1 fL (ref 78.0–100.0)
MCV: 85.9 fL (ref 78.0–100.0)
MCV: 85.6 fL (ref 78.0–100.0)
Platelets: 63 10*3/uL — ABNORMAL LOW (ref 150–400)
Platelets: 61 10*3/uL — ABNORMAL LOW (ref 150–400)
Platelets: 70 10*3/uL — ABNORMAL LOW (ref 150–400)
Platelets: 85 10*3/uL — ABNORMAL LOW (ref 150–400)
Platelets: 88 10*3/uL — ABNORMAL LOW (ref 150–400)
Platelets: 73 10*3/uL — ABNORMAL LOW (ref 150–400)
RBC: 5.39 MIL/uL — ABNORMAL HIGH (ref 3.87–5.11)
RBC: 4.91 MIL/uL (ref 3.87–5.11)
RBC: 4.44 MIL/uL (ref 3.87–5.11)
RBC: 4.61 MIL/uL (ref 3.87–5.11)
RBC: 3.34 MIL/uL — ABNORMAL LOW (ref 3.87–5.11)
RBC: 3.26 MIL/uL — ABNORMAL LOW (ref 3.87–5.11)
RDW: 15.2 % (ref 11.5–15.5)
RDW: 15.7 % — AB (ref 11.5–15.5)
RDW: 15.4 % (ref 11.5–15.5)
RDW: 15.5 % (ref 11.5–15.5)
RDW: 15.5 % (ref 11.5–15.5)
RDW: 15.5 % (ref 11.5–15.5)
WBC: 42.9 10*3/uL — AB (ref 4.0–10.5)
WBC: 42.2 10*3/uL — AB (ref 4.0–10.5)
WBC: 32.9 10*3/uL — ABNORMAL HIGH (ref 4.0–10.5)
WBC: 30.9 10*3/uL — AB (ref 4.0–10.5)
WBC: 22.8 10*3/uL — ABNORMAL HIGH (ref 4.0–10.5)
WBC: 21.3 10*3/uL — ABNORMAL HIGH (ref 4.0–10.5)

## 2017-06-05 LAB — FIBRINOGEN
FIBRINOGEN: 221 mg/dL (ref 210–475)
Fibrinogen: 255 mg/dL (ref 210–475)

## 2017-06-05 LAB — PROTIME-INR
INR: 2.48
INR: 2.44
PROTHROMBIN TIME: 26.7 s — AB (ref 11.4–15.2)
Prothrombin Time: 26.3 seconds — ABNORMAL HIGH (ref 11.4–15.2)

## 2017-06-05 LAB — PREPARE RBC (CROSSMATCH)

## 2017-06-05 LAB — COMPREHENSIVE METABOLIC PANEL
ALBUMIN: 1.6 g/dL — AB (ref 3.5–5.0)
ALT: 389 U/L — ABNORMAL HIGH (ref 14–54)
AST: 2448 U/L — AB (ref 15–41)
Alkaline Phosphatase: 92 U/L (ref 38–126)
Anion gap: 15 (ref 5–15)
BILIRUBIN TOTAL: 3.5 mg/dL — AB (ref 0.3–1.2)
BUN: 26 mg/dL — AB (ref 6–20)
CHLORIDE: 107 mmol/L (ref 101–111)
CO2: 16 mmol/L — ABNORMAL LOW (ref 22–32)
Calcium: 8 mg/dL — ABNORMAL LOW (ref 8.9–10.3)
Creatinine, Ser: 1.36 mg/dL — ABNORMAL HIGH (ref 0.44–1.00)
GFR calc Af Amer: 41 mL/min — ABNORMAL LOW (ref >60)
GFR calc non Af Amer: 36 mL/min — ABNORMAL LOW (ref >60)
GLUCOSE: 202 mg/dL — AB (ref 65–99)
POTASSIUM: 5 mmol/L (ref 3.5–5.1)
Sodium: 138 mmol/L (ref 135–145)
TOTAL PROTEIN: 3.3 g/dL — AB (ref 6.5–8.1)

## 2017-06-05 LAB — POCT I-STAT, CHEM 8
BUN: 23 mg/dL — ABNORMAL HIGH (ref 6–20)
CHLORIDE: 106 mmol/L (ref 101–111)
Calcium, Ion: 1.34 mmol/L (ref 1.15–1.40)
Creatinine, Ser: 1 mg/dL (ref 0.44–1.00)
GLUCOSE: 167 mg/dL — AB (ref 65–99)
HEMATOCRIT: 31 % — AB (ref 36.0–46.0)
HEMOGLOBIN: 10.5 g/dL — AB (ref 12.0–15.0)
POTASSIUM: 4.5 mmol/L (ref 3.5–5.1)
SODIUM: 141 mmol/L (ref 135–145)
TCO2: 20 mmol/L — ABNORMAL LOW (ref 22–32)

## 2017-06-05 LAB — APTT: APTT: 36 s (ref 24–36)

## 2017-06-05 LAB — LACTIC ACID, PLASMA
LACTIC ACID, VENOUS: 6.2 mmol/L — AB (ref 0.5–1.9)
LACTIC ACID, VENOUS: 4.2 mmol/L — AB (ref 0.5–1.9)

## 2017-06-05 LAB — BLOOD PRODUCT ORDER (VERBAL) VERIFICATION

## 2017-06-05 LAB — GLUCOSE, CAPILLARY: GLUCOSE-CAPILLARY: 152 mg/dL — AB (ref 65–99)

## 2017-06-05 LAB — TROPONIN I: Troponin I: 0.38 ng/mL (ref <0.03)

## 2017-06-05 MED ORDER — SODIUM CHLORIDE 0.9 % IV SOLN
0.0000 ug/min | INTRAVENOUS | Status: DC
  Administered 2017-06-05: 400 ug/min via INTRAVENOUS
  Administered 2017-06-05: 75 ug/min via INTRAVENOUS
  Filled 2017-06-05 (×2): qty 4

## 2017-06-05 MED ORDER — SODIUM CHLORIDE 0.9 % IV SOLN
Freq: Once | INTRAVENOUS | Status: AC
  Administered 2017-06-05: 08:00:00 via INTRAVENOUS

## 2017-06-05 MED ORDER — SODIUM CHLORIDE 0.9 % IV SOLN
1.0000 g | Freq: Once | INTRAVENOUS | Status: AC
  Administered 2017-06-05: 1 g via INTRAVENOUS
  Filled 2017-06-05: qty 10

## 2017-06-05 MED ORDER — FENTANYL CITRATE (PF) 100 MCG/2ML IJ SOLN
50.0000 ug | INTRAMUSCULAR | Status: DC | PRN
  Administered 2017-06-06 – 2017-06-11 (×8): 50 ug via INTRAVENOUS
  Filled 2017-06-05 (×7): qty 2

## 2017-06-05 MED ORDER — FENTANYL CITRATE (PF) 100 MCG/2ML IJ SOLN
50.0000 ug | Freq: Once | INTRAMUSCULAR | Status: AC
  Administered 2017-06-05: 50 ug via INTRAVENOUS
  Filled 2017-06-05: qty 2

## 2017-06-05 MED ORDER — SODIUM CHLORIDE 0.9 % IV SOLN
0.0000 ug/min | INTRAVENOUS | Status: DC
  Administered 2017-06-05: 400 ug/min via INTRAVENOUS
  Administered 2017-06-05: 150 ug/min via INTRAVENOUS
  Administered 2017-06-05: 400 ug/min via INTRAVENOUS
  Filled 2017-06-05 (×6): qty 8

## 2017-06-05 MED ORDER — SODIUM CHLORIDE 0.9 % IV SOLN
Freq: Once | INTRAVENOUS | Status: AC
  Administered 2017-06-05: 14:00:00 via INTRAVENOUS

## 2017-06-05 MED ORDER — SODIUM BICARBONATE 8.4 % IV SOLN
100.0000 meq | Freq: Once | INTRAVENOUS | Status: AC
  Administered 2017-06-05: 100 meq via INTRAVENOUS

## 2017-06-05 MED ORDER — SODIUM CHLORIDE 0.9 % IV SOLN: Freq: Once | INTRAVENOUS | Status: DC

## 2017-06-05 MED ORDER — SODIUM CHLORIDE 0.9 % IV BOLUS (SEPSIS)
1000.0000 mL | Freq: Once | INTRAVENOUS | Status: AC
  Administered 2017-06-05: 1000 mL via INTRAVENOUS

## 2017-06-05 MED ORDER — SODIUM BICARBONATE 8.4 % IV SOLN
INTRAVENOUS | Status: AC
  Administered 2017-06-05: 50 meq
  Filled 2017-06-05: qty 50

## 2017-06-05 MED ORDER — ATROPINE SULFATE 1 MG/10ML IJ SOSY
PREFILLED_SYRINGE | INTRAMUSCULAR | Status: AC
  Filled 2017-06-05: qty 10

## 2017-06-05 MED ORDER — SODIUM BICARBONATE 8.4 % IV SOLN
INTRAVENOUS | Status: DC
  Administered 2017-06-05 (×2): via INTRAVENOUS
  Filled 2017-06-05 (×8): qty 150

## 2017-06-05 MED ORDER — CHLORHEXIDINE GLUCONATE 0.12% ORAL RINSE (MEDLINE KIT)
15.0000 mL | Freq: Two times a day (BID) | OROMUCOSAL | Status: DC
  Administered 2017-06-05 – 2017-06-17 (×25): 15 mL via OROMUCOSAL

## 2017-06-05 MED ORDER — FENTANYL CITRATE (PF) 100 MCG/2ML IJ SOLN
50.0000 ug | INTRAMUSCULAR | Status: AC | PRN
  Administered 2017-06-06 – 2017-06-10 (×3): 50 ug via INTRAVENOUS
  Filled 2017-06-05 (×4): qty 2

## 2017-06-05 MED ORDER — CALCIUM CHLORIDE 10 % IV SOLN
1.0000 g | Freq: Once | INTRAVENOUS | Status: AC
  Administered 2017-06-05: 1 g via INTRAVENOUS

## 2017-06-05 MED ORDER — HYDROCORTISONE NA SUCCINATE PF 100 MG IJ SOLR
50.0000 mg | Freq: Three times a day (TID) | INTRAMUSCULAR | Status: DC
  Administered 2017-06-05 (×2): 50 mg via INTRAVENOUS
  Filled 2017-06-05: qty 1
  Filled 2017-06-05: qty 2

## 2017-06-05 MED ORDER — NOREPINEPHRINE BITARTRATE 1 MG/ML IV SOLN
0.0000 ug/min | INTRAVENOUS | Status: DC
  Administered 2017-06-05: 35 ug/min via INTRAVENOUS
  Administered 2017-06-05 (×2): 40 ug/min via INTRAVENOUS
  Administered 2017-06-07: 10 ug/min via INTRAVENOUS
  Administered 2017-06-08: 8 ug/min via INTRAVENOUS
  Administered 2017-06-11: 19 ug/min via INTRAVENOUS
  Administered 2017-06-12: 2 ug/min via INTRAVENOUS
  Administered 2017-06-14: 16 ug/min via INTRAVENOUS
  Administered 2017-06-15: 12.053 ug/min via INTRAVENOUS
  Administered 2017-06-16: 13.973 ug/min via INTRAVENOUS
  Administered 2017-06-17: 25 ug/min via INTRAVENOUS
  Filled 2017-06-05 (×16): qty 16

## 2017-06-05 MED ORDER — ORAL CARE MOUTH RINSE
15.0000 mL | Freq: Four times a day (QID) | OROMUCOSAL | Status: DC
  Administered 2017-06-05 – 2017-06-17 (×49): 15 mL via OROMUCOSAL

## 2017-06-05 MED FILL — Medication: Qty: 1 | Status: AC

## 2017-06-05 NOTE — Progress Notes (Signed)
Physical Therapy Discharge Patient Details Name: [name] MRN: [id number] DOB: [birth year] Today's Date: 06/05/2017 Time:  -     Patient discharged from PT services secondary to medical decline - will need to re-order PT to resume therapy services.  Please see latest therapy progress note for current level of functioning and progress toward goals.    Progress and discharge plan discussed with patient and/or caregiver: Patient unable to participate in discharge planning and no caregivers available  GP     Shary Decamp [facility] 06/05/2017, 2:42 PM  [facility] PT [phone]

## 2017-06-05 NOTE — Patient Outreach (Signed)
[facility]) Care Management  06/05/2017  AMARIONA RATHJE 20-Jul-1935 [id number]  [facility] difficult case discussions held 10/11, 10/18. Patient is currently inpatient status in ICU. Hospital readmission within 30 days. Patient is currently medically unstable. 2nd admission with acute on chronic HF, elevated troponin, gangrenous ulcers to calf & thigh requiring multiple I &D's with wound vac, Developed GI bleeding then shock; taken to OR for exploratory lap with pyloroplasty & oversewing of duodenal ulcer. Plans are for SNF when stable. [facility] hospital liaison following. [facility] care coordinator & Clinical social will follow as needed.   Sherrin Daisy, RN BSN [place] Management Coordinator [facility] Care Management  [phone]

## 2017-06-05 NOTE — Procedures (Signed)
Central Venous Catheter Insertion Procedure Note ELENOR WILDES [id number] July 14, 1935  Procedure: Insertion of Central Venous Catheter Indications: Assessment of intravascular volume, Drug and/or fluid administration and Frequent blood sampling  Procedure Details Consent: Unable to obtain consent because of emergent medical necessity. Time Out: Verified patient identification, verified procedure, site/side was marked, verified correct patient position, special equipment/implants available, medications/allergies/relevent history reviewed, required imaging and test results available.  Performed  Maximum sterile technique was used including antiseptics, cap, gloves, gown, hand hygiene, mask and sheet. Skin prep: Chlorhexidine; local anesthetic administered A antimicrobial bonded/coated triple lumen catheter was placed in the right internal jugular vein using the Seldinger technique.  Evaluation Blood flow good Complications: No apparent complications Patient did tolerate procedure well. Chest X-ray ordered to verify placement.  CXR: pending.  Procedure performed under direct ultrasound guidance for real time vessel cannulation.      Montey Hora, [place] [facility] Pager: [phone]  or [phone] 06/05/2017, 12:30 AM   .. I, Dr Seward Carol Agree with the above note by PA Pt required central venous catheter for hemorrhagic shock  Emergent procedure.  Time out performed and maximal barrier precautions with sterile technique used for line insertion. No complications occurred. I personally reviewed the chest xray post insertion  Confirmed position of Right IJ CVC   Dr. Seward Carol [facility]

## 2017-06-05 NOTE — Progress Notes (Signed)
Called to bedside to assess patient for increased bleeding per rectum, OG, and JP    Blood pressure 109/83, pulse 61, temperature 98.2 F (36.8 C), temperature source Axillary, resp. rate (!) 23, height 5' 2.99" (1.6 m), weight 108.4 kg (239 lb), SpO2 96 %.    CBC    Component Value Date/Time   WBC 22.8 (H) 06/05/2017 2111   RBC 3.34 (L) 06/05/2017 2111   HGB 10.0 (L) 06/05/2017 2111   HCT 28.7 (L) 06/05/2017 2111   PLT 88 (L) 06/05/2017 2111   MCV 85.9 06/05/2017 2111   MCH 29.9 06/05/2017 2111   MCHC 34.8 06/05/2017 2111   RDW 15.5 06/05/2017 2111   LYMPHSABS 1.0 05/22/2017 1416   MONOABS 1.0 05/22/2017 1416   EOSABS 0.1 05/22/2017 1416   BASOSABS 0.0 05/22/2017 1416    BMET    Component Value Date/Time   NA 139 06/05/2017 2111   K 3.4 (L) 06/05/2017 2111   CL 104 06/05/2017 2111   CO2 25 06/05/2017 2111   GLUCOSE 193 (H) 06/05/2017 2111   BUN 32 (H) 06/05/2017 2111   CREATININE 1.52 (H) 06/05/2017 2111   CREATININE 1.10 (H) 01/31/2016 1118   CALCIUM 7.8 (L) 06/05/2017 2111   GFRNONAA 31 (L) 06/05/2017 2111   GFRAA 36 (L) 06/05/2017 2111    CVP 12 SV 67 SVR 1298 CO 4.4   Plan -Surgery Called > At bedside  -Send Coag/DIC panel  -Decrease Pressors (MAP currently 85-90) Goal >60 -Atropine at bedside for Bradycardia    Hayden Pedro, AGACNP-BC [place] [facility]  Pgr: [phone]  PCCM Pgr: [phone]

## 2017-06-05 NOTE — Progress Notes (Signed)
Increased RR per MD verbal order.

## 2017-06-05 NOTE — Progress Notes (Signed)
eLink Physician-Brief Progress Note Patient Name: [name] DOB: 04/27/1935 MRN: [id number]   Date of Service  06/05/2017  HPI/Events of Note  Rn calling concern for new bright red blood below and JP drainage changes without drop in BP  Will have PCCM to bedside stat to assess ffp x 3 May need plat Get lactic, fibrogen, stat cbc  eICU Interventions       Intervention Category Major Interventions: Hemorrhage - evaluation and management  Raylene Miyamoto. 06/05/2017, 10:48 PM

## 2017-06-05 NOTE — Progress Notes (Signed)
Critical lactic acid  Troponin and ABG  results given to Dr. Everrett Coombe.

## 2017-06-05 NOTE — Progress Notes (Signed)
[facility] Progress Note    Since last GI note: EGD last night, unable to stop massive bleeding from duodenal ulcer and she went to the OR for xlap, oversewing, pyloroplasty  She is not bleeding any further but is now on maximum dose of 4 pressors to maintain SBP in 70s  Objective: Vital signs in last 24 hours: Temp:  [97.3 F (36.3 C)-98.3 F (36.8 C)] 97.4 F (36.3 C) (10/18 0410) Pulse Rate:  [34-119] 103 (10/18 0815) Resp:  [0-39] 15 (10/18 0815) BP: (30-112)/(12-82) 59/24 (10/18 0800) SpO2:  [64 %-100 %] 94 % (10/18 0815) Arterial Line BP: (48-131)/(33-97) 83/51 (10/18 0815) FiO2 (%):  [60 %-100 %] 60 % (10/18 0500) Weight:  [218 lb (98.9 kg)-239 lb (108.4 kg)] 239 lb (108.4 kg) (10/18 0449) Last BM Date: 06/05/17 General: intubated Heart: regular rate and rythm    Lab Results:  Recent Labs  06/03/17 0526  06/03/2017 1520  05/22/2017 2250 06/05/17 0120 06/05/17 0413  WBC 11.6*  --  21.3*  --   --   --  42.9*  HGB 8.1*  < > 8.0*  < > 11.2* 10.5* 16.4*  PLT 265  --  198  --   --   --  63*  MCV 94.1  --  92.3  --   --   --  88.9  < > = values in this interval not displayed.  Recent Labs  06/12/2017 0356  06/05/2017 1520 06/15/2017 2111  05/24/2017 2223 05/20/2017 2250 06/05/17 0120 06/05/17 0413  NA 137  < > 139 140  < > 140 141 141 138  K 4.8  < > 4.9 5.0  < > 5.6* 5.0 4.5 5.0  CL 92*  --  105 105  --   --   --  106 107  CO2 38*  --  25  --   --   --   --   --  16*  GLUCOSE 88  --  116* 131*  --  156*  --  167* 202*  BUN 21*  --  24* 24*  --   --   --  23* 26*  CREATININE 0.76  --  0.97 0.90  --   --   --  1.00 1.36*  CALCIUM 8.5*  --  6.8*  --   --   --   --   --  8.0*  < > = values in this interval not displayed.  Recent Labs  06/03/17 0526 06/05/17 0413  PROT 4.4* 3.3*  ALBUMIN 1.4* 1.6*  AST 32 2,448*  ALT <5* 389*  ALKPHOS 194* 92  BILITOT 0.6 3.5*    Recent Labs  05/29/2017 1530  INR 1.44    Medications: Scheduled Meds: .  ambrisentan  5 mg Oral Daily  . brimonidine  1 drop Right Eye BID  . brinzolamide  1 drop Both Eyes BID  . chlorhexidine gluconate (MEDLINE KIT)  15 mL Mouth Rinse BID  . docusate sodium  100 mg Oral BID  . Gerhardt's butt cream   Topical BID  . hydrocortisone sod succinate (SOLU-CORTEF) inj  50 mg Intravenous Q8H  . insulin aspart  10 Units Intravenous Once  . latanoprost  1 drop Right Eye QHS  . mouth rinse  15 mL Mouth Rinse QID  . midodrine  10 mg Oral TID WC  . sodium chloride flush  3 mL Intravenous Q12H  . timolol  1 drop Both Eyes BID   Continuous Infusions: . sodium  chloride    . sodium chloride 10 mL/hr at 06/02/2017 1821  . sodium chloride    . sodium chloride    . epinephrine 20 mcg/min (06/05/17 0408)  . lactated ringers 10 mL/hr at 06/03/2017 1800  . linezolid (ZYVOX) IV Stopped (06/03/17 2306)  . methocarbamol (ROBAXIN)  IV    . norepinephrine (LEVOPHED) Adult infusion 40 mcg/min (06/05/17 0305)  . pantoprozole (PROTONIX) infusion 8 mg/hr (06/05/2017 1800)  . phenylephrine (NEO-SYNEPHRINE) Adult infusion 400 mcg/min (06/05/17 0537)  . piperacillin-tazobactam (ZOSYN)  IV Stopped (06/05/17 0441)  .  sodium bicarbonate  infusion 1000 mL Stopped (06/05/17 0119)  . vasopressin (PITRESSIN) infusion - *FOR SHOCK* 0.04 Units/min (06/05/17 0000)   PRN Meds:.sodium chloride, acetaminophen **OR** acetaminophen, bisacodyl, fentaNYL (SUBLIMAZE) injection, fentaNYL (SUBLIMAZE) injection, HYDROmorphone (DILAUDID) injection, magnesium citrate, methocarbamol **OR** methocarbamol (ROBAXIN)  IV, metoCLOPramide **OR** metoCLOPramide (REGLAN) injection, ondansetron **OR** ondansetron (ZOFRAN) IV, oxyCODONE, polyethylene glycol, sodium chloride flush    Assessment/Plan: 82 y.o. female with massive UGI bleeding from duodenal ulcer  Bleeding stopped in OR after unsuccessful EGD last night. Unfortunately she is requiring maximum dose of 4 pressors.  Prognosis is still very guarded (to poor).     Continue aggressive ICU care. PPI infusion and will check H. Pylori serologies tomorrow.   Milus Banister, MD  06/05/2017, 8:24 AM [facility] Pager [phone]

## 2017-06-05 NOTE — Anesthesia Postprocedure Evaluation (Signed)
Anesthesia Post Note  Patient: [name]  Procedure(s) Performed: EXPLORATORY LAPAROTOMY REPAIR OF DUODENAL ULCER (N/A Abdomen)     Patient location during evaluation: SICU Anesthesia Type: General Level of consciousness: sedated Pain management: pain level controlled Vital Signs Assessment: vitals unstable Respiratory status: patient remains intubated per anesthesia plan Cardiovascular status: unstable Postop Assessment: no apparent nausea or vomiting Anesthetic complications: no Comments: Patient remains intubated with unstable vital signs despite several high dose vasopressor infusions.     Last Vitals:  Vitals:   06/05/17 0540 06/05/17 0600  BP:    Pulse: 97 (!) 101  Resp: 20 (!) 22  Temp:    SpO2: 95% 95%    Last Pain:  Vitals:   06/05/17 0410  TempSrc: Axillary  PainSc:                  Audry Pili

## 2017-06-05 NOTE — Consult Note (Signed)
PULMONARY / CRITICAL CARE MEDICINE   Name: [name] MRN: [id number] DOB: 19-Mar-1935    ADMISSION DATE:  06/08/2017 CONSULTATION DATE:  05/23/2017  REFERRING MD:  Dr. Sherral Hammers  CHIEF COMPLAINT:  Acute hypotension  HISTORY OF PRESENT ILLNESS:   82 year old female with history of HFpEF, necrotizing fasciitis of left lower leg, HLD, tricuspid regurg, pulmonary hypertension, CKD III, COPD on 2.5L at night presents with hypotension requiring pressor support after repeat left leg wound debridement earlier today (10/17). Patient was in her usual state of health until 9/22 when she presented for traumatic left leg hematoma and hypovolemic shock. She was admitted to the hospital 05/10/17-05/16/2017 and subsequently discharged to SNF where she experienced worsening weakness. She was then readmitted with acute on chronic HFpEF exacerbation on 10/1.  During this hospital stay patient continued to have hemodynamic instability requiring 8 units PRBC over her hospital course. She has been taken back to the OR  On 10/5, 10/10, 10/12, and 10/17 for further wound debridement. Today, after her procedure was completed and wound vac in place, she was in PACU where her blood pressure was noted to drop to 09T systolic. She was given albumin. Neo was initiated. Patient indicated she was dizzy and lightheaded. 3u PRBC were administered. PCCM was called for consult.  Of note patient had grossly bloody stool in the PACU with her hypotensive episode. Bowel movement was described as formed but grossly bloody/in a pool of blood. She denies prior episodes of melena or hematochezia. She endorses being constipated over past few days.   SUBJECTIVE: Yesterday afternoon, GI consulted for bloody stools upper endoscopy revealed a large bleeding duodenal ulcer, unable to stop bleeding during endoscopy. She was taken to ICU where she received pressors, PRBC and fluid through peripheral IV's. She was hemodynamically unstable. Surgery  was consulted and performed exploratory laparotomy. Patient was found to have perforated duodenal ulcer with active extravasation.  Subsequently brought back to ICU where she has been maxed on 4 pressors, systemic steroids.  VITAL SIGNS: BP (!) 57/35   Pulse 95   Temp (!) 97.4 F (36.3 C) (Axillary)   Resp 17   Ht 5' 2.99" (1.6 m)   Wt 239 lb (108.4 kg)   SpO2 95%   BMI 42.35 kg/m   HEMODYNAMICS:    VENTILATOR SETTINGS: Vent Mode: PRVC FiO2 (%):  [60 %-100 %] 60 % Set Rate:  [20 bmp] 20 bmp Vt Set:  [350 mL-450 mL] 450 mL PEEP:  [5 cmH20] 5 cmH20 Plateau Pressure:  [16 cmH20-19 cmH20] 19 cmH20  INTAKE / OUTPUT: I/O last 3 completed shifts: In: 11825.9 [P.O.:240; I.V.:7536.2; Blood:2599.7; IV Piggyback:1450] Out: 2565 [Urine:1255; Drains:960; Blood:350]  PHYSICAL EXAMINATION: General:  Critically ill-appearing female rests in bed Neuro:  Opens eyes to name, follows commands HEENT:  MMM, PERRL EOMI, no scleral icterus or conjunctival injection  Cardiovascular:  RRR, no m/r/g Lungs:  MV, CTA anteriorly, no W/R/R Abdomen:  +bandage in place, clean and dry. Normoactive BS Musculoskeletal:  +left wound vac in place, left leg wrapped, draining minimal fluid. No LE edema Skin:  No rashes or lesions  LABS:  BMET  Recent Labs Lab 05/29/2017 0356  05/22/2017 1520 06/03/2017 2111  06/16/2017 2223 05/29/2017 2250 06/05/17 0120 06/05/17 0413  NA 137  < > 139 140  < > 140 141 141 138  K 4.8  < > 4.9 5.0  < > 5.6* 5.0 4.5 5.0  CL 92*  --  105 105  --   --   --  106 107  CO2 38*  --  25  --   --   --   --   --  16*  BUN 21*  --  24* 24*  --   --   --  23* 26*  CREATININE 0.76  --  0.97 0.90  --   --   --  1.00 1.36*  GLUCOSE 88  --  116* 131*  --  156*  --  167* 202*  < > = values in this interval not displayed.  Electrolytes  Recent Labs Lab 06/05/2017 0249 05/31/17 0557  06/18/2017 0356 05/22/2017 1520 06/05/17 0413  CALCIUM 8.5* 8.4*  < > 8.5* 6.8* 8.0*  MG 2.1 2.2  --  2.0   --   --   < > = values in this interval not displayed.  CBC  Recent Labs Lab 06/03/17 0526  06/14/2017 1520  05/23/2017 2250 06/05/17 0120 06/05/17 0413  WBC 11.6*  --  21.3*  --   --   --  42.9*  HGB 8.1*  < > 8.0*  < > 11.2* 10.5* 16.4*  HCT 27.3*  < > 25.1*  < > 33.0* 31.0* 47.9*  PLT 265  --  198  --   --   --  63*  < > = values in this interval not displayed.  Coag's  Recent Labs Lab 05/28/2017 1530  INR 1.44    Sepsis Markers No results for input(s): LATICACIDVEN, PROCALCITON, O2SATVEN in the last 168 hours.  ABG  Recent Labs Lab 06/14/2017 2152 05/22/2017 2250 06/05/17 0108  PHART 7.269* 7.246* 7.412  PCO2ART 38.6 39.5 88.1*  PO2ART 330.0* 304.0* 72.0*    Liver Enzymes  Recent Labs Lab 06/03/17 0526 06/05/17 0413  AST 32 2,448*  ALT <5* 389*  ALKPHOS 194* 92  BILITOT 0.6 3.5*  ALBUMIN 1.4* 1.6*    Cardiac Enzymes  Recent Labs Lab 06/10/2017 1520  TROPONINI 0.04*    Glucose  Recent Labs Lab 06/04/17 2345  GLUCAP 70    Imaging Dg Chest Port 1 View  Result Date: 06/05/2017 CLINICAL DATA:  82 y/o  F; dyspnea. EXAM: PORTABLE CHEST 1 VIEW COMPARISON:  06/05/2017 chest radiograph. FINDINGS: Stable cardiac enlargement given rotated projection and technique. Endotracheal tube tip 3.1 cm from carina. Enteric tube tip extends below the field of view. Right central venous catheter tip projects over mid SVC is stable. Lung bases are partially obscured. Stable small right and moderate left pleural effusions. No acute osseous abnormality identified. IMPRESSION: Stable lines and tubes given projection and technique. Stable bilateral pleural effusions. Lung bases are partially obscured. Stable cardiac enlargement. Electronically Signed   By: Kristine Garbe M.D.   On: 06/05/2017 04:36   Dg Chest Port 1 View  Result Date: 06/05/2017 CLINICAL DATA:  Central line placement and NG tube placement EXAM: PORTABLE CHEST 1 VIEW COMPARISON:  06/11/2017  FINDINGS: Patient is rotated, limiting examination. There has been interval placement of an endotracheal tube with tip measuring 4 cm above the carina. An enteric tube is been placed with tip off the field of view but below the left hemidiaphragm. Right central venous catheter with tip over the low SVC region. No pneumothorax. Cardiac enlargement. Bilateral pleural effusions with basilar atelectasis. Thoracolumbar scoliosis. IMPRESSION: Appliances appear in satisfactory location. Cardiac enlargement. Bilateral pleural effusions. Electronically Signed   By: Lucienne Capers M.D.   On: 06/05/2017 01:24   Dg Abd Portable 1v  Result Date: 06/05/2017 CLINICAL DATA:  Central line and  NG tube placement EXAM: PORTABLE ABDOMEN - 1 VIEW COMPARISON:  05/28/2017 FINDINGS: Patient is rotated, limiting evaluation. The enteric tube tip is demonstrated in the right upper quadrant consistent with location in the distal stomach. Thoracolumbar scoliosis. Surgical clips in the right upper quadrant. IMPRESSION: Enteric tube tip is in the right upper quadrant consistent with location in the distal stomach. Electronically Signed   By: Lucienne Capers M.D.   On: 06/05/2017 01:25   Dg Abd Portable 1v  Result Date: 05/29/2017 CLINICAL DATA:  Check nasogastric catheter placement EXAM: PORTABLE ABDOMEN - 1 VIEW COMPARISON:  None. FINDINGS: Scattered large and small bowel gas is noted. Nasogastric catheter is noted coiled within the stomach. Cardiomegaly is seen. Diffuse degenerative changes of the thoracolumbar spine are noted with scoliosis concave to the right. IMPRESSION: Nasogastric catheter within the stomach. Electronically Signed   By: Inez Catalina M.D.   On: 05/22/2017 18:08     STUDIES:    CULTURES: Surgical wound culture 10/10 >> E coli, Pseudomonas, Morganella morganii, proteus, VRE  ANTIBIOTICS: 10/10 Ancef >> 10/12 10/15 Linezolid >> 10/12 Zosyn >>  SIGNIFICANT EVENTS: 10/5 Left thigh and left leg  debridement 10/10 Repeat irrigation and debridement left thigh and left leg 10/12 Debridement left leg and thigh 10/17 Debridement left thigh wound 10/17 EGD, exploratory laparotomy with bleeding duodenal ulcer/perforated duodenum. Intubated, maxed on 4 pressors in ICU.  LINES/TUBES: PIV x3 Foley catheter Negative pressure wound L leg  CVC triple lumen NG/OG  DISCUSSION: 82 year old female with history of HFpEF, necrotizing fasciitis of left lower leg, HLD, tricuspid regurg, pulmonary hypertension, CKD III, COPD on 2L at night, presents after repeat left lower extremity debridement with acute hypotension. Subsequently found to have duodenal ulcer with perforation, in hemorrhagic shock. Maxed on 4 pressors in ICU, intubated.  ASSESSMENT / PLAN:  GASTROINTESTINAL A:   Acute GI bleed 2/2 duodenal ulcer with hemorrhagic shock Perforated duodenum with extravasation POD#1 s/p ex lap Shock liver; ALT 389, AST 2448 P:   Surgery, GI following PPI gtt Monitor LFTs Check Hgb q4H Follow coags Give unit of cryo, platelets   PULMONARY A: Pulmonary Hypertension - [facility] 11/2015 with PAH 59/17 mmHg Mechanically ventilated P:   40 mg lasix daily >> hold in setting of acute hypotension Hold letaris  CARDIOVASCULAR A:  HFpEF (Last echo 05/21/2017 indicates EF 60-65%, moderate MR, mod pulm HTN) with intermittent episodes of hypotension. Paroxysmal Atrial fibrillation CAD Elevated troponin Hypovolemic shock requiring multiple blood transfusions P:  Continue pressure support with epnipehrine, levophed, neosynephrine, vasopressin Monitor intake/output Daily weights Goal MAP >60 Hold Lasix Check troponin x1 Flow track for fluid status, volume resuscitate if hypovolemic CVP q8H - may be unreliable with pulm HTN  RENAL A:   AKI on CKD stage III (baseline Cr 0.8-1.1) Hypokalemia P:   Trend BMP Replete K as needed Monitor Calcium q8H Monitor for acidosis with ABG q4H Bicarb  gtt  HEMATOLOGIC A:   Hemorrhagic shock requiring multiple blood transfusions Acute blood loss anemia P:  Post-transfusion CBC Pressure support as noted above S/p massive infusion PRBC overnight  INFECTIOUS A:   Surgical wound culture 10/10 >> E coli, Pseudomonas, Morganella morganii, proteus, VRE P:   Continue linezolid (started 10/15), Zosyn (started 10/12) Follow wound cultures Monitor fever curve, leukocytosis  ENDOCRINE A:   Overweight P:   No diagnosis of diabetes; monitor glucose on Bmets Currently on stress dose steroids - low threshold to discontinue as shock resolves given the increased risk for GI  ulcer with steroids  NEUROLOGIC A:   Neurologically intact, no acute process P:   Monitor mental status  FAMILY  - Inter-disciplinary family meet or Palliative Care meeting due by:  06/11/2017  Everrett Coombe, MD PGY-2 [facility]  06/05/2017 8:05 AM

## 2017-06-05 NOTE — Progress Notes (Signed)
Patient ID: [name], female   DOB: Jan 03, 1935, 82 y.o.   MRN: [id number] Events of the evening noted. Patient's left leg has excellent granulation tissue there is no urgency in proceeding with surgery for the left lower extremity. I have discussed with the patient's daughter and husband that her only option for the left lower extremity at this time is a above-the-knee amputation.

## 2017-06-05 NOTE — Progress Notes (Signed)
Patient seen and examined this morning. Critically ill on 4 pressors. Etiology of her shock is not completely clear at this moment, her labs are pending. At 4am, her hgb was noted to be 16. She has received at least 7U PRBC. Output from rectal tube and gastric tube currently dark red.  Objective: Vital signs in last 24 hours: Temp:  [97.3 F (36.3 C)-98.3 F (36.8 C)] 97.4 F (36.3 C) (10/18 0410) Pulse Rate:  [34-119] 103 (10/18 0815) Resp:  [0-39] 15 (10/18 0815) BP: (30-112)/(12-82) 59/24 (10/18 0800) SpO2:  [64 %-100 %] 94 % (10/18 0815) Arterial Line BP: (48-131)/(33-97) 83/51 (10/18 0815) FiO2 (%):  [60 %-100 %] 60 % (10/18 0500) Weight:  [98.9 kg (218 lb)-108.4 kg (239 lb)] 108.4 kg (239 lb) (10/18 0449) Last BM Date: 06/05/17  Intake/Output from previous day: 10/17 0701 - 10/18 0700 In: 11235.9 [I.V.:7536.2; Blood:2599.7; IV Piggyback:1100] Out: 1340 [Urine:355; Drains:635; Blood:350] Intake/Output this shift: No intake/output data recorded.  Gen: NAD, comfortable CV: Tachycardic rate, reg rhythm Abd: Soft, nondistended Ext: SCDs in place  Lab Results: CBC   Recent Labs  05/31/2017 1520  06/05/17 0120 06/05/17 0413  WBC 21.3*  --   --  42.9*  HGB 8.0*  < > 10.5* 16.4*  HCT 25.1*  < > 31.0* 47.9*  PLT 198  --   --  63*  < > = values in this interval not displayed. BMET  Recent Labs  06/02/2017 1520  06/05/17 0120 06/05/17 0413  NA 139  < > 141 138  K 4.9  < > 4.5 5.0  CL 105  < > 106 107  CO2 25  --   --  16*  GLUCOSE 116*  < > 167* 202*  BUN 24*  < > 23* 26*  CREATININE 0.97  < > 1.00 1.36*  CALCIUM 6.8*  --   --  8.0*  < > = values in this interval not displayed. PT/INR  Recent Labs  06/01/2017 1530  LABPROT 17.4*  INR 1.44   ABG  Recent Labs  05/31/2017 2250 06/05/17 0108  PHART 7.246* 7.412  HCO3 17.4* 56.3*    Studies/Results:  Anti-infectives: Anti-infectives    Start     Dose/Rate Route Frequency Ordered Stop   06/02/2017 0930   ceFAZolin (ANCEF) IVPB 2g/100 mL premix     2 g 200 mL/hr over 30 Minutes Intravenous On call to O.R. 06/03/17 1214 05/26/2017 1006   06/02/17 2200  linezolid (ZYVOX) IVPB 600 mg    Comments:  Pharmacy may adjust dose prn   600 mg 300 mL/hr over 60 Minutes Intravenous Every 12 hours 06/02/17 1546     06/02/17 1545  linezolid (ZYVOX) IVPB 600 mg  Status:  Discontinued     600 mg 300 mL/hr over 60 Minutes Intravenous Every 12 hours 06/02/17 1543 06/02/17 1546   05/31/17 1500  vancomycin (VANCOCIN) 1,250 mg in sodium chloride 0.9 % 250 mL IVPB  Status:  Discontinued     1,250 mg 166.7 mL/hr over 90 Minutes Intravenous Every 24 hours 05/29/2017 1409 06/03/17 0845   06/09/2017 2000  piperacillin-tazobactam (ZOSYN) IVPB 3.375 g     3.375 g 12.5 mL/hr over 240 Minutes Intravenous Every 8 hours 06/04/2017 1409     06/11/2017 1300  vancomycin (VANCOCIN) 2,000 mg in sodium chloride 0.9 % 500 mL IVPB     2,000 mg 250 mL/hr over 120 Minutes Intravenous  Once 06/10/2017 1208 06/12/2017 1657   06/06/2017 1300  piperacillin-tazobactam (ZOSYN) IVPB  3.375 g     3.375 g 100 mL/hr over 30 Minutes Intravenous  Once 05/22/2017 1208 06/16/2017 1334   06/01/2017 0745  ceFAZolin (ANCEF) IVPB 2g/100 mL premix  Status:  Discontinued     2 g 200 mL/hr over 30 Minutes Intravenous On call to O.R. 05/29/2017 0736 06/16/2017 1143   06/13/2017 1800  ceFAZolin (ANCEF) IVPB 2g/100 mL premix  Status:  Discontinued     2 g 200 mL/hr over 30 Minutes Intravenous Every 8 hours 05/21/2017 1220 06/13/2017 1210   06/05/2017 0945  ceFAZolin (ANCEF) IVPB 2g/100 mL premix     2 g 200 mL/hr over 30 Minutes Intravenous To Surgery 05/27/17 2001 05/24/2017 1015   05/26/2017 2000  ceFAZolin (ANCEF) IVPB 2g/100 mL premix     2 g 200 mL/hr over 30 Minutes Intravenous Every 6 hours 06/09/2017 1643 05/24/17 1009   06/08/2017 1015  ceFAZolin (ANCEF) IVPB 2g/100 mL premix     2 g 200 mL/hr over 30 Minutes Intravenous On call to O.R. 05/26/2017 1014 06/16/2017 1400        Assessment/Plan: Patient Active Problem List   Diagnosis Date Noted  . Hemorrhagic shock ([place])   . Acute hypoxemic respiratory failure ([place])   . Pressure injury of skin 06/09/2017  . Necrotizing fasciitis of lower leg ([place])   . Traumatic hematoma of left lower leg   . Atherosclerosis of native arteries of extremities with gangrene, left leg ([place])   . Anemia   . Elevated troponin 06/14/2017  . Hypotension   . Acute blood loss anemia 05/11/2017  . CKD (chronic kidney disease), stage III ([place]) 05/10/2017  . Thigh hematoma, left, initial encounter 05/10/2017  . Leg hematoma, left, initial encounter 05/10/2017  . Chronic respiratory failure ([place]) 07/22/2016  . Chronic rhinitis 02/08/2016  . Acute on chronic diastolic CHF (congestive heart failure) ([place]) 12/28/2015  . PAH (pulmonary artery hypertension) ([place])   . CHF (congestive heart failure) ([place]) 11/17/2015  . Acute congestive heart failure ([place]) 11/17/2015  . Pulmonary nodules 10/07/2013  . Encounter for therapeutic drug monitoring 09/22/2013  . DOE (dyspnea on exertion) 05/26/2012  . Psoriasis   . Carotid bruit   . Warfarin anticoagulation   . CAD (coronary artery disease)   . Pulmonary hypertension ([place]) 04/19/2011  . Atrial fibrillation ([place]) 11/16/2010  . GERD 04/17/2010  . COLONIC POLYPS, HX OF 04/17/2010  . Hyperlipidemia 04/17/2009  . Overweight(278.02) 04/17/2009  . Essential hypertension 04/17/2009   s/p Procedure(s): EXPLORATORY LAPAROTOMY REPAIR OF DUODENAL ULCER 06/06/2017  Critically ill Continue resuscitation, pressors Full set of labs - cbc, cmp, coags Will likely need FFP, cryo, possible plt Will follow closely Prognosis is quite poor at this point - family was not available on rounds but will return to attempt discussions as well   LOS: 17 days   Sharon Mt. Dema Severin, M.D. General and Colorectal Surgery [facility]

## 2017-06-05 NOTE — Progress Notes (Signed)
eLink Physician-Brief Progress Note Patient Name: [name] DOB: 1935-04-11 MRN: [id number]   Date of Service  06/05/2017  HPI/Events of Note  Persistent hypotension despite maxed on 4 pressors.   eICU Interventions  Bolus 1L IVF, empiric steroids, stat CXR, labs.      Intervention Category Major Interventions: Hypotension - evaluation and management  Laverle Hobby 06/05/2017, 4:19 AM

## 2017-06-05 NOTE — Progress Notes (Signed)
Patient was in her bed intubated. Two nurses on-site but no family members. Family had been present. Checked in confrence but did not find. Chaplain told the nurse to call should family reappear.Chaplain Provided emotional support, compassionate presence and encouragement to the two nurses on-site.  Chaplain Lilybeth Vien.

## 2017-06-06 ENCOUNTER — Encounter (HOSPITAL_COMMUNITY): Admission: EM | Disposition: E | Payer: Self-pay | Source: Home / Self Care | Attending: Internal Medicine

## 2017-06-06 ENCOUNTER — Inpatient Hospital Stay (HOSPITAL_COMMUNITY): Payer: Medicare Other

## 2017-06-06 LAB — BPAM FFP
BLOOD PRODUCT EXPIRATION DATE: 201810191450
BLOOD PRODUCT EXPIRATION DATE: 201810191450
BLOOD PRODUCT EXPIRATION DATE: 201810232359
BLOOD PRODUCT EXPIRATION DATE: 201810232359
Blood Product Expiration Date: 201810191450
Blood Product Expiration Date: 201810191450
Blood Product Expiration Date: 201810232359
ISSUE DATE / TIME: 201810181518
ISSUE DATE / TIME: 201810181518
ISSUE DATE / TIME: 201810181518
ISSUE DATE / TIME: 201810181526
ISSUE DATE / TIME: 201810182328
ISSUE DATE / TIME: 201810182328
ISSUE DATE / TIME: 201810182328
UNIT TYPE AND RH: 7300
UNIT TYPE AND RH: 7300
UNIT TYPE AND RH: 7300
Unit Type and Rh: 7300
Unit Type and Rh: 7300
Unit Type and Rh: 1700
Unit Type and Rh: 7300

## 2017-06-06 LAB — PREPARE RBC (CROSSMATCH)

## 2017-06-06 LAB — CBC
HEMATOCRIT: 24.1 % — AB (ref 36.0–46.0)
HEMATOCRIT: 24 % — AB (ref 36.0–46.0)
HEMATOCRIT: 33.8 % — AB (ref 36.0–46.0)
HEMATOCRIT: 34.7 % — AB (ref 36.0–46.0)
Hemoglobin: 8.2 g/dL — ABNORMAL LOW (ref 12.0–15.0)
Hemoglobin: 8.4 g/dL — ABNORMAL LOW (ref 12.0–15.0)
Hemoglobin: 11.5 g/dL — ABNORMAL LOW (ref 12.0–15.0)
Hemoglobin: 11.8 g/dL — ABNORMAL LOW (ref 12.0–15.0)
MCH: 29.5 pg (ref 26.0–34.0)
MCH: 30.2 pg (ref 26.0–34.0)
MCH: 29.3 pg (ref 26.0–34.0)
MCH: 29.3 pg (ref 26.0–34.0)
MCHC: 34 g/dL (ref 30.0–36.0)
MCHC: 35 g/dL (ref 30.0–36.0)
MCHC: 34 g/dL (ref 30.0–36.0)
MCHC: 34 g/dL (ref 30.0–36.0)
MCV: 86.7 fL (ref 78.0–100.0)
MCV: 86.3 fL (ref 78.0–100.0)
MCV: 86 fL (ref 78.0–100.0)
MCV: 86.1 fL (ref 78.0–100.0)
PLATELETS: 71 10*3/uL — AB (ref 150–400)
PLATELETS: 53 10*3/uL — AB (ref 150–400)
Platelets: 78 10*3/uL — ABNORMAL LOW (ref 150–400)
Platelets: 60 10*3/uL — ABNORMAL LOW (ref 150–400)
RBC: 2.78 MIL/uL — ABNORMAL LOW (ref 3.87–5.11)
RBC: 2.78 MIL/uL — ABNORMAL LOW (ref 3.87–5.11)
RBC: 3.93 MIL/uL (ref 3.87–5.11)
RBC: 4.03 MIL/uL (ref 3.87–5.11)
RDW: 15.6 % — AB (ref 11.5–15.5)
RDW: 15.8 % — AB (ref 11.5–15.5)
RDW: 15.2 % (ref 11.5–15.5)
RDW: 15.7 % — ABNORMAL HIGH (ref 11.5–15.5)
WBC: 18.4 10*3/uL — ABNORMAL HIGH (ref 4.0–10.5)
WBC: 19.1 10*3/uL — AB (ref 4.0–10.5)
WBC: 22 10*3/uL — ABNORMAL HIGH (ref 4.0–10.5)
WBC: 21.2 10*3/uL — ABNORMAL HIGH (ref 4.0–10.5)

## 2017-06-06 LAB — PREPARE FRESH FROZEN PLASMA
UNIT DIVISION: 0
UNIT DIVISION: 0
UNIT DIVISION: 0
Unit division: 0
Unit division: 0
Unit division: 0
Unit division: 0

## 2017-06-06 LAB — BLOOD GAS, ARTERIAL
Acid-Base Excess: 2.2 mmol/L — ABNORMAL HIGH (ref 0.0–2.0)
Acid-Base Excess: 3.7 mmol/L — ABNORMAL HIGH (ref 0.0–2.0)
BICARBONATE: 24.8 mmol/L (ref 20.0–28.0)
BICARBONATE: 27.1 mmol/L (ref 20.0–28.0)
DRAWN BY: 252031
Drawn by: 252031
FIO2: 50
FIO2: 60
LHR: 26 {breaths}/min
MECHVT: 450 mL
MECHVT: 450 mL
O2 Saturation: 95 %
O2 Saturation: 97.6 %
PCO2 ART: 37.1 mmHg (ref 32.0–48.0)
PEEP: 5 cmH2O
PEEP: 5 cmH2O
PO2 ART: 64.9 mmHg — AB (ref 83.0–108.0)
PO2 ART: 86.7 mmHg (ref 83.0–108.0)
Patient temperature: 98.6
Patient temperature: 98.6
RATE: 26 resp/min
pCO2 arterial: 29.5 mmHg — ABNORMAL LOW (ref 32.0–48.0)
pH, Arterial: 7.477 — ABNORMAL HIGH (ref 7.350–7.450)
pH, Arterial: 7.535 — ABNORMAL HIGH (ref 7.350–7.450)

## 2017-06-06 LAB — BASIC METABOLIC PANEL
Anion gap: 10 (ref 5–15)
Anion gap: 10 (ref 5–15)
Anion gap: 8 (ref 5–15)
BUN: 33 mg/dL — AB (ref 6–20)
BUN: 36 mg/dL — ABNORMAL HIGH (ref 6–20)
BUN: 37 mg/dL — ABNORMAL HIGH (ref 6–20)
CALCIUM: 7.6 mg/dL — AB (ref 8.9–10.3)
CALCIUM: 7.9 mg/dL — AB (ref 8.9–10.3)
CHLORIDE: 102 mmol/L (ref 101–111)
CHLORIDE: 108 mmol/L (ref 101–111)
CO2: 25 mmol/L (ref 22–32)
CO2: 26 mmol/L (ref 22–32)
CO2: 25 mmol/L (ref 22–32)
CREATININE: 1.64 mg/dL — AB (ref 0.44–1.00)
CREATININE: 1.73 mg/dL — AB (ref 0.44–1.00)
CREATININE: 1.81 mg/dL — AB (ref 0.44–1.00)
Calcium: 7.8 mg/dL — ABNORMAL LOW (ref 8.9–10.3)
Chloride: 105 mmol/L (ref 101–111)
GFR calc Af Amer: 29 mL/min — ABNORMAL LOW (ref >60)
GFR calc non Af Amer: 28 mL/min — ABNORMAL LOW (ref >60)
GFR calc non Af Amer: 25 mL/min — ABNORMAL LOW (ref >60)
GFR, EST AFRICAN AMERICAN: 33 mL/min — AB (ref >60)
GFR, EST AFRICAN AMERICAN: 31 mL/min — AB (ref >60)
GFR, EST NON AFRICAN AMERICAN: 27 mL/min — AB (ref >60)
GLUCOSE: 82 mg/dL (ref 65–99)
Glucose, Bld: 137 mg/dL — ABNORMAL HIGH (ref 65–99)
Glucose, Bld: 111 mg/dL — ABNORMAL HIGH (ref 65–99)
Potassium: 3.1 mmol/L — ABNORMAL LOW (ref 3.5–5.1)
Potassium: 3.7 mmol/L (ref 3.5–5.1)
Potassium: 3.8 mmol/L (ref 3.5–5.1)
SODIUM: 141 mmol/L (ref 135–145)
Sodium: 137 mmol/L (ref 135–145)
Sodium: 141 mmol/L (ref 135–145)

## 2017-06-06 LAB — PREPARE PLATELET PHERESIS
UNIT DIVISION: 0
Unit division: 0
Unit division: 0

## 2017-06-06 LAB — BPAM PLATELET PHERESIS
BLOOD PRODUCT EXPIRATION DATE: 201810202359
Blood Product Expiration Date: 201810182359
Blood Product Expiration Date: 201810202359
ISSUE DATE / TIME: 201810180829
ISSUE DATE / TIME: 201810181447
ISSUE DATE / TIME: 201810182329
UNIT TYPE AND RH: 6200
UNIT TYPE AND RH: 8400
Unit Type and Rh: 5100

## 2017-06-06 LAB — PREPARE CRYOPRECIPITATE: UNIT DIVISION: 0

## 2017-06-06 LAB — POCT I-STAT 3, ART BLOOD GAS (G3+)
Acid-Base Excess: 3 mmol/L — ABNORMAL HIGH (ref 0.0–2.0)
Acid-Base Excess: 1 mmol/L (ref 0.0–2.0)
Acid-Base Excess: 2 mmol/L (ref 0.0–2.0)
BICARBONATE: 24.7 mmol/L (ref 20.0–28.0)
Bicarbonate: 27 mmol/L (ref 20.0–28.0)
Bicarbonate: 25.3 mmol/L (ref 20.0–28.0)
O2 SAT: 96 %
O2 Saturation: 93 %
O2 Saturation: 96 %
PCO2 ART: 34.6 mmHg (ref 32.0–48.0)
PCO2 ART: 35.2 mmHg (ref 32.0–48.0)
PH ART: 7.479 — AB (ref 7.350–7.450)
PH ART: 7.464 — AB (ref 7.350–7.450)
PO2 ART: 79 mmHg — AB (ref 83.0–108.0)
Patient temperature: 98.6
TCO2: 28 mmol/L (ref 22–32)
TCO2: 26 mmol/L (ref 22–32)
TCO2: 26 mmol/L (ref 22–32)
pCO2 arterial: 36.4 mmHg (ref 32.0–48.0)
pH, Arterial: 7.461 — ABNORMAL HIGH (ref 7.350–7.450)
pO2, Arterial: 62 mmHg — ABNORMAL LOW (ref 83.0–108.0)
pO2, Arterial: 79 mmHg — ABNORMAL LOW (ref 83.0–108.0)

## 2017-06-06 LAB — BPAM CRYOPRECIPITATE
BLOOD PRODUCT EXPIRATION DATE: 201810181422
ISSUE DATE / TIME: 201810180844
UNIT TYPE AND RH: 5100

## 2017-06-06 LAB — DIC (DISSEMINATED INTRAVASCULAR COAGULATION) PANEL
D DIMER QUANT: 10.89 ug{FEU}/mL — AB (ref 0.00–0.50)
INR: 1.55
PLATELETS: 80 10*3/uL — AB (ref 150–400)

## 2017-06-06 LAB — GLUCOSE, CAPILLARY
GLUCOSE-CAPILLARY: 91 mg/dL (ref 65–99)
GLUCOSE-CAPILLARY: 86 mg/dL (ref 65–99)
Glucose-Capillary: 66 mg/dL (ref 65–99)

## 2017-06-06 LAB — PROTIME-INR
INR: 1.51
Prothrombin Time: 18.1 seconds — ABNORMAL HIGH (ref 11.4–15.2)

## 2017-06-06 LAB — APTT: APTT: 33 s (ref 24–36)

## 2017-06-06 LAB — DIC (DISSEMINATED INTRAVASCULAR COAGULATION)PANEL
Fibrinogen: 246 mg/dL (ref 210–475)
Prothrombin Time: 18.5 seconds — ABNORMAL HIGH (ref 11.4–15.2)
Smear Review: NONE SEEN
aPTT: 34 seconds (ref 24–36)

## 2017-06-06 LAB — CALCIUM, IONIZED: CALCIUM, IONIZED, SERUM: 5.1 mg/dL (ref 4.5–5.6)

## 2017-06-06 SURGERY — IRRIGATION AND DEBRIDEMENT EXTREMITY
Anesthesia: Choice | Laterality: Left

## 2017-06-06 MED ORDER — DEXTROSE 50 % IV SOLN
25.0000 mL | Freq: Once | INTRAVENOUS | Status: AC
  Administered 2017-06-06: 25 mL via INTRAVENOUS

## 2017-06-06 MED ORDER — DEXTROSE 50 % IV SOLN
1.0000 | Freq: Once | INTRAVENOUS | Status: AC
  Administered 2017-06-06: 50 mL via INTRAVENOUS
  Filled 2017-06-06: qty 50

## 2017-06-06 MED ORDER — DEXTROSE-NACL 5-0.9 % IV SOLN
INTRAVENOUS | Status: AC
  Administered 2017-06-06 – 2017-06-08 (×3): via INTRAVENOUS

## 2017-06-06 MED ORDER — DEXTROSE 50 % IV SOLN
INTRAVENOUS | Status: AC
  Administered 2017-06-06: 50 mL via INTRAVENOUS
  Filled 2017-06-06: qty 50

## 2017-06-06 MED ORDER — POTASSIUM CHLORIDE 10 MEQ/50ML IV SOLN
10.0000 meq | INTRAVENOUS | Status: AC
  Administered 2017-06-06 (×3): 10 meq via INTRAVENOUS
  Filled 2017-06-06 (×3): qty 50

## 2017-06-06 MED ORDER — SODIUM CHLORIDE 0.9 % IV SOLN
Freq: Once | INTRAVENOUS | Status: AC
Start: 2017-06-06 — End: 2017-06-06
  Administered 2017-06-06: 11:00:00 via INTRAVENOUS

## 2017-06-06 MED ORDER — DEXTROSE 50 % IV SOLN
INTRAVENOUS | Status: AC
  Filled 2017-06-06: qty 50

## 2017-06-06 NOTE — Progress Notes (Signed)
Called [facility] regarding negative pressure therapy on LLE of patient. Orders need clarification of instillation. Will call back regarding information. Modena Morrow E, RN 05/21/2017 10:26 AM

## 2017-06-06 NOTE — Progress Notes (Signed)
CRITICAL VALUE ALERT  Critical Value:  67  Date & Time Notied:  06/10/2017  Provider Notified: Yes  Orders Received/Actions taken: Yes

## 2017-06-06 NOTE — Progress Notes (Signed)
eLink Physician-Brief Progress Note Patient Name: [name] DOB: [birth year] MRN: [id number]   Date of Service  05/24/2017  HPI/Events of Note  Low k  supp k   eICU Interventions       Intervention Category Intermediate Interventions: Electrolyte abnormality - evaluation and management  Karsen Nakanishi J. 05/30/2017, 3:47 AM

## 2017-06-06 NOTE — Progress Notes (Signed)
RN asked me to place D5 IVF order for patient who is persistently hypoglycemic and is NPO given rectal bleeding. Reviewed chart briefly hx diastolic CHF but felt to be dry currently, Pox 100%, should be able to tolerate gentle IVF's. Start D5NS @ 50cc/hr

## 2017-06-06 NOTE — Progress Notes (Signed)
Pharmacy Antibiotic Note  [name] is a 82 y.o. female admitted on 05/28/2017 s/p mechanical fall with resulting traumatic hematoma on L thigh and leg. She has been going for repeat I&Ds with placement of wound vacs to help infection. She had been receiving Ancef peri-operatively, but is currently maintained on zosyn and zyvox. She is improving from episode of hemorrhagic shock that occurred 10/17-10/18.  Wound Cx (abscess L leg):  Polymicrobial wound infection- abx coverage adjusted due to presence of VRE - Vancomycin discontinued on 10/15 and added Linezolid (zyvox) and continued Zosyn as it should cover all other noted bacteria.    Plan: -Zosyn 3.375 g IV q8h -Linezolid 600 mg IV q12h -Monitor renal fx, cultures   Height: 5' 2.99" (160 cm) Weight: 262 lb (118.8 kg) IBW/kg (Calculated) : 52.38  Temp (24hrs), Avg:98.5 F (36.9 C), Min:97.9 F (36.6 C), Max:99.1 F (37.3 C)   Recent Labs Lab 06/05/17 1154 06/05/17 1208 06/05/17 1523 06/05/17 2111 06/05/17 2252 06/14/2017 0239 06/09/2017 0615 06/13/2017 0824  WBC  --  32.9* 30.9* 22.8* 21.3* 18.4* 19.1*  --   CREATININE  --  1.34* 1.45* 1.52*  --  1.64*  --  1.73*  LATICACIDVEN 6.2*  --   --   --  4.2*  --   --   --      Antimicrobials this admission: 10/12 vancomycin >10/15 10/12 zosyn > 10/15 linezolid >   Dose adjustments this admission: N/A  Microbiology results:  10/10 wound culture (abscess L leg):   Pseudomonas  Proteus mirabails  VRE  E. Coli  Morgan[place]  10/5 mrsa pcr: neg    Hughes Better, PharmD, BCPS Clinical Pharmacist 06/08/2017 2:17 PM

## 2017-06-06 NOTE — Progress Notes (Signed)
Occupational Therapy Discharge Patient Details Name: [name] MRN: [id number] DOB: [birth year] Today's Date: 05/24/2017 Time:  -     Patient discharged from OT services secondary to medical decline - will need to re-order OT to resume therapy services.  Please see latest therapy progress note for current level of functioning and progress toward goals.    Progress and discharge plan discussed with patient and/or caregiver: Patient unable to participate in discharge planning and no caregivers available  [place], OTR/L [phone]  [name] 06/05/2017, 3:12 PM

## 2017-06-06 NOTE — Progress Notes (Signed)
Initial Nutrition Assessment  DOCUMENTATION CODES:   Morbid obesity  INTERVENTION:   When able to start enteral nutrition, recommend:  Vital High Protein at 65 ml/h (1560 ml per day)  Provides 1560 kcal, 137 gm protein, 1304 ml free water daily  NUTRITION DIAGNOSIS:   Inadequate oral intake related to inability to eat as evidenced by NPO status.  GOAL:   Provide needs based on ASPEN/SCCM guidelines  MONITOR:   Vent status, Skin, I & O's, Labs  REASON FOR ASSESSMENT:   Ventilator    ASSESSMENT:   82 yo female with PMH of HTN, HLD, CAD, morbid obesity, GERD, degenerative arthritis, recent fall at home (2 weeks PTA) who was admitted on 10/1 with acute on chronic CHF exacerbation, hypotension, elevated troponin, and L leg hematoma. S/P I&D of L thigh and leg on 10/5 for necrotizing fasciitis, I&D was repeated on 10/10, 10/12, 10/17. Found to have GI bleeding from perforated duodenal ulcer and underwent exploratory laparotomy, oversewing, and pyloroplasty on 10/17. Remained on vent post-op.  Discussed patient in ICU rounds and with RN. Unable to start enteral nutrition at this time.  Patient remains on 4 pressors. Guarded prognosis noted.  Nutrition-Focused physical exam completed. Findings are no fat depletion, no muscle depletion, and severe edema.   Patient is currently intubated on ventilator support Temp (24hrs), Avg:98.5 F (36.9 C), Min:97.9 F (36.6 C), Max:99.1 F (37.3 C)   Labs and medications reviewed.  Diet Order:  Diet NPO time specified  Skin: stage 2-coccyx, buttocks, thigh; closed surg incisions L leg x 2 & abd; VAC to L leg  Last BM:  10/19 (type 7-liquid)  Height:   Ht Readings from Last 1 Encounters:  05/26/2017 5' 2.99" (1.6 m)    Weight:   Wt Readings from Last 1 Encounters:  06/04/2017 262 lb (118.8 kg)   PTA, weight was 200-225 lbs; now increased with severe edema.  Ideal Body Weight:  52.3 kg  BMI:  Body mass index is 46.42  kg/m.  Estimated Nutritional Needs:   Kcal:  1941-7408  Protein:  130 gm  Fluid:  1.8-2 L  EDUCATION NEEDS:   No education needs identified at this time  Molli Barrows, [place], [place], [place] Pager [phone] After Hours Pager [phone]

## 2017-06-06 NOTE — Progress Notes (Signed)
PULMONARY / CRITICAL CARE MEDICINE   Name: [name] MRN: [id number] DOB: 1935/02/01    ADMISSION DATE:  05/20/2017 CONSULTATION DATE:  06/14/2017  REFERRING MD:  Dr. Sherral Hammers  CHIEF COMPLAINT:  Acute hypotension  HISTORY OF PRESENT ILLNESS:   82 year old female with history of HFpEF, necrotizing fasciitis of left lower leg, HLD, tricuspid regurg, pulmonary hypertension, CKD III, COPD on 2.5L at night presents with hypotension requiring pressor support after repeat left leg wound debridement earlier today (10/17). Patient was in her usual state of health until 9/22 when she presented for traumatic left leg hematoma and hypovolemic shock. She was admitted to the hospital 05/10/17-05/16/2017 and subsequently discharged to SNF where she experienced worsening weakness. She was then readmitted with acute on chronic HFpEF exacerbation on 10/1.  During this hospital stay patient continued to have hemodynamic instability requiring 8 units PRBC over her hospital course. She has been taken back to the OR  On 10/5, 10/10, 10/12, and 10/17 for further wound debridement. Today, after her procedure was completed and wound vac in place, she was in PACU where her blood pressure was noted to drop to 99B systolic. She was given albumin. Neo was initiated. Patient indicated she was dizzy and lightheaded. 3u PRBC were administered. PCCM was called for consult.  Of note patient had grossly bloody stool in the PACU with her hypotensive episode. Bowel movement was described as formed but grossly bloody/in a pool of blood. She denies prior episodes of melena or hematochezia. She endorses being constipated over past few days.   SUBJECTIVE: Overnight, patient noted to have increased blood per rectum. Surgery was called. PCCM assessed at bedside. Coag/DIC panel was sent. Pressors were decreased for a map goal >60. Atropine was placed at bedside for bradycardia. Patient continues to be on vasopressin. Other pressors were  weaned.  VITAL SIGNS: BP (!) 116/41   Pulse (!) 52   Temp 98.4 F (36.9 C) (Oral)   Resp (!) 26   Ht 5' 2.99" (1.6 m)   Wt 262 lb (118.8 kg)   SpO2 92%   BMI 46.42 kg/m   HEMODYNAMICS: CVP:  [2 mmHg-26 mmHg] 11 mmHg CO:  [3.8 L/min-4.8 L/min] 4.5 L/min CI:  [2.1 L/min/m2-2.4 L/min/m2] 2.1 L/min/m2  VENTILATOR SETTINGS: Vent Mode: PRVC FiO2 (%):  [60 %] 60 % Set Rate:  [26 bmp] 26 bmp Vt Set:  [450 mL] 450 mL PEEP:  [5 cmH20] 5 cmH20 Plateau Pressure:  [20 cmH20-26 cmH20] 26 cmH20  INTAKE / OUTPUT: I/O last 3 completed shifts: In: 17487.8 [I.V.:10625.9; Blood:5126.9; Other:75; IV Piggyback:1660] Out: 2917 [Urine:362; Drains:2255; Blood:300]  PHYSICAL EXAMINATION: General:  Critically ill-appearing female rests in bed Neuro:  Continues to open eyes to name, follows commands Cardiovascular:  Heart sounds distant. Irregularly irregular, rate-controlled, no m/r/g Lungs:  MV, CTA anteriorly, no W/R/R Abdomen:  +bandage in place, clean and dry. Mildly distended, appropriately tender to palpation without rebound or guarding Musculoskeletal:  +left wound vac in place, left leg wrapped, draining minimal fluid. No LE edema Skin:  No rashes or lesions. Dusky extremities  LABS:  BMET  Recent Labs Lab 06/05/17 1523 06/05/17 2111 06/04/2017 0239  NA 137 139 137  K 4.1 3.4* 3.1*  CL 105 104 102  CO2 20* 25 25  BUN 29* 32* 33*  CREATININE 1.45* 1.52* 1.64*  GLUCOSE 219* 193* 137*    Electrolytes  Recent Labs Lab 05/31/17 0557  06/10/2017 0356  06/05/17 1523 06/05/17 2111 06/06/17 0239  CALCIUM  8.4*  < > 8.5*  < > 7.4* 7.8* 7.6*  MG 2.2  --  2.0  --   --   --   --   < > = values in this interval not displayed.  CBC  Recent Labs Lab 06/05/17 2111 06/05/17 2252 06/05/17 2303 06/12/2017 0239  WBC 22.8* 21.3*  --  18.4*  HGB 10.0* 9.8*  --  8.2*  HCT 28.7* 27.9*  --  24.1*  PLT 88* 73* 80* 78*    Coag's  Recent Labs Lab 06/05/17 0820 06/05/17 1225  06/05/17 2303  APTT  --  36 34  INR 2.48 2.44 1.55    Sepsis Markers  Recent Labs Lab 06/05/17 1154 06/05/17 2252  LATICACIDVEN 6.2* 4.2*    ABG  Recent Labs Lab 06/05/17 2318 06/16/2017 0513 05/28/2017 0727  PHART 7.473* 7.477* 7.479*  PCO2ART 36.6 37.1 36.4  PO2ART 60.6* 64.9* 62.0*    Liver Enzymes  Recent Labs Lab 06/03/17 0526 06/05/17 0413  AST 32 2,448*  ALT <5* 389*  ALKPHOS 194* 92  BILITOT 0.6 3.5*  ALBUMIN 1.4* 1.6*    Cardiac Enzymes  Recent Labs Lab 06/07/2017 1520 06/05/17 1208  TROPONINI 0.04* 0.38*    Glucose  Recent Labs Lab 06/14/2017 2345 06/05/17 2027  GLUCAP 70 152*    Imaging Dg Chest Port 1 View  Result Date: 06/10/2017 CLINICAL DATA:  Intubation. EXAM: PORTABLE CHEST 1 VIEW COMPARISON:  06/05/2017. FINDINGS: Endotracheal tube and right IJ line stable position. Stable cardiomegaly. Cardiomegaly with mild bilateral pulmonary interstitial prominence and bilateral pleural effusions. Low lung volumes with prominent right lower lobe atelectasis No pneumothorax. IMPRESSION: 1.  Endotracheal tube and right IJ line in stable position. 2. Cardiomegaly with mild bilateral interstitial prominence and bilateral pleural effusions consistent CHF. 3.  Low lung volumes with prominent right lower lobe atelectasis . Electronically Signed   By: Marcello Moores  Register   On: 06/06/2017 06:58     STUDIES:    CULTURES: Surgical wound culture 10/10 >> E coli, Pseudomonas, Morganella morganii, proteus, VRE  ANTIBIOTICS: 10/10 Ancef >> 10/12 10/15 Linezolid >> 10/12 Zosyn >>  SIGNIFICANT EVENTS: 10/5 Left thigh and left leg debridement 10/10 Repeat irrigation and debridement left thigh and left leg 10/12 Debridement left leg and thigh 10/17 Debridement left thigh wound 10/17 EGD, exploratory laparotomy with bleeding duodenal ulcer/perforated duodenum. Intubated, maxed on 4 pressors in ICU.  LINES/TUBES: PIV x3 Foley catheter Negative pressure wound  L leg  CVC triple lumen NG/OG  Labs/Studies CVP 11 ABG 7.479/36.4/27.0  DISCUSSION: 82 year old female with history of HFpEF, necrotizing fasciitis of left lower leg, HLD, tricuspid regurg, pulmonary hypertension, CKD III, COPD on 2L at night, presents after repeat left lower extremity debridement with acute hypotension. Subsequently found to have duodenal ulcer with perforation, in hemorrhagic shock. Maxed on 4 pressors in ICU, intubated.  ASSESSMENT / PLAN:  GASTROINTESTINAL A:   Acute GI bleed 2/2 duodenal ulcer with hemorrhagic shock Perforated duodenum with extravasation POD#1 s/p ex lap Shock liver; ALT 389, AST 2448 P:   Surgery, GI following PPI gtt Monitor LFTs Check Hgb q4H Follow coags Give 4 units cryo, platelets   PULMONARY A: Pulmonary Hypertension - [facility] 11/2015 with PAH 59/17 mmHg Mechanically ventilated P:   40 mg lasix daily >> hold in setting of acute hypotension/hemorrhagic shock Hold letaris  CARDIOVASCULAR A:  HFpEF (Last echo 05/21/2017 indicates EF 60-65%, moderate MR, mod pulm HTN) with intermittent episodes of hypotension. Paroxysmal Atrial fibrillation CAD Elevated  troponin Hypovolemic shock requiring massive blood transfusion P:  Continue pressure support, goal MAP >60 Monitor intake/output Goal MAP >60 Flow track for fluid status, volume resuscitate if hypovolemic CVP q8H - may be unreliable with pulm HTN  RENAL A:   AKI on CKD stage III (baseline Cr 0.8-1.1) Oliguria Hypokalemia P:   Trend BMP K repleted overnight 10/18 Monitor Calcium q8H, replete as needed Monitor for acidosis with ABG q4H Bicarb gtt Still no UOP, closely monitoring fluid status   HEMATOLOGIC A:   Hemorrhagic shock requiring massive blood transfusions Acute blood loss anemia P:  q4H CBG Pressure support as noted above S/p cryo, platelets  Continued BRBPR >> Coag/DIC panel pending  INFECTIOUS A:   Surgical wound culture 10/10 >> E coli,  Pseudomonas, Morganella morganii, proteus, VRE P:   Continue linezolid (started 10/15), Zosyn (started 10/12) Follow wound cultures Monitor fever curve, leukocytosis (worsened with steroids given 10/18)  ENDOCRINE A:   Overweight P:   No diagnosis of diabetes; monitor glucose on Bmets Stress dose steroids discontinued as MAPs improved  NEUROLOGIC A:   Remains neurologically intact, no acute process  P:   Monitor mental status  FAMILY  - Inter-disciplinary family meet or Palliative Care meeting due by:  06/11/2017  Everrett Coombe, MD PGY-2 [facility]  05/23/2017 7:52 AM

## 2017-06-06 NOTE — Progress Notes (Signed)
Surgery Progress Note  Patient Details:    [name] is an 82 y.o. female.  Lines/tubes : Airway (Active)  Secured at (cm) 23 cm 05/21/2017  7:19 AM  Measured From Lips 05/23/2017  7:19 AM  Secured Location Right 05/26/2017  7:19 AM  Secured By [employer] 05/19/2017  7:19 AM  Tube Holder Repositioned Yes 06/16/2017  7:19 AM  Cuff Pressure (cm H2O) 26 cm H2O 06/05/2017  7:50 PM  Site Condition Dry 05/23/2017  7:19 AM     CVC Triple Lumen 06/05/17 Right Internal jugular (Active)  Indication for Insertion or Continuance of Line Vasoactive infusions 05/30/2017  4:00 AM  Site Assessment Clean;Dry;Intact 06/11/2017  4:00 AM  Proximal Lumen Status Infusing 05/25/2017  4:00 AM  Medial Lumen Status Infusing 06/05/2017  4:00 AM  Distal Lumen Status Infusing;In-line blood sampling system in place 06/07/2017  4:00 AM  Dressing Type Transparent;Occlusive 06/08/2017  4:00 AM  Dressing Status Clean;Dry;Intact;Antimicrobial disc in place 06/02/2017  4:00 AM  Line Care Connections checked and tightened;Zeroed and calibrated;Leveled 05/20/2017  4:00 AM  Dressing Intervention New dressing 06/05/2017 12:30 AM  Dressing Change Due 06/12/17 05/29/2017  4:00 AM     Closed System Drain 1 RLQ Bulb (JP) 19 Fr. (Active)  Site Description Unable to view 06/18/2017  4:00 AM  Dressing Status Intact 06/14/2017  4:00 AM  Drainage Appearance Serosanguineous 06/10/2017  4:00 AM  Status To suction (Charged) 05/22/2017  4:00 AM  Intake (mL) 75 ml 06/05/2017  6:46 PM  Output (mL) 75 mL 06/05/2017  7:00 AM     Negative Pressure Wound Therapy Leg Left (Active)  Last dressing change 06/14/2017 05/19/2017  5:30 PM  Site / Wound Assessment Dressing in place / Unable to assess 05/29/2017  4:00 AM  Peri-wound Assessment Other (Comment) 06/01/2017  8:00 PM  Wound filler - Illona Bulman foam 9 05/26/2017 10:28 AM  Cycle Continuous 06/04/2017  4:00 AM  Target Pressure (mmHg) 100 05/23/2017  4:00 AM   Canister Changed No 05/22/2017  4:00 AM  Dressing Status Intact 06/11/2017  4:00 AM  Drainage Amount Minimal 06/05/2017 12:00 AM  Drainage Description Serosanguineous 06/05/2017 12:00 AM  Output (mL) 20 mL 06/14/2017  6:00 AM     NG/OG Tube Nasogastric 16 Fr. Right nare Confirmed by Surgical Manipulation (Active)  Site Assessment Clean;Dry;Intact 05/21/2017  4:00 AM  Ongoing Placement Verification No change in respiratory status;No acute changes, not attributed to clinical condition 05/28/2017  4:00 AM  Status Suction-low intermittent 06/18/2017  4:00 AM  Drainage Appearance Bloody 05/28/2017  4:00 AM     Urethral Catheter Daleen Bo RN Latex 16 Fr. (Active)  Indication for Insertion or Continuance of Catheter Unstable critical patients (first 24-48 hours) 06/04/2017  4:00 AM  Site Assessment Clean;Intact 06/08/2017  4:00 AM  Catheter Maintenance Bag below level of bladder;Catheter secured;Drainage bag/tubing not touching floor;Insertion date on drainage bag;No dependent loops;Seal intact 06/04/2017  4:00 AM  Collection Container Standard drainage bag 05/21/2017  4:00 AM  Securement Method Leg strap 06/05/2017  4:00 AM  Urinary Catheter Interventions Unclamped 05/28/2017  4:00 AM  Output (mL) 2 mL 06/06/2017  6:00 AM    Microbiology/Sepsis markers: Results for orders placed or performed during the hospital encounter of 05/21/2017  MRSA PCR Screening     Status: None   Collection Time: 05/20/17  6:37 PM  Result Value Ref Range Status   MRSA by PCR NEGATIVE NEGATIVE Final    Comment:  The GeneXpert MRSA Assay (FDA approved for NASAL specimens only), is one component of a comprehensive MRSA colonization surveillance program. It is not intended to diagnose MRSA infection nor to guide or monitor treatment for MRSA infections.   Surgical pcr screen     Status: None   Collection Time: 06/16/2017  6:38 AM  Result Value Ref Range Status   MRSA, PCR NEGATIVE NEGATIVE Final    Staphylococcus aureus NEGATIVE NEGATIVE Final    Comment: (NOTE) The Xpert SA Assay (FDA approved for NASAL specimens in patients 41 years of age and older), is one component of a comprehensive surveillance program. It is not intended to diagnose infection nor to guide or monitor treatment.   Aerobic/Anaerobic Culture (surgical/deep wound)     Status: None   Collection Time: 05/24/2017 10:26 AM  Result Value Ref Range Status   Specimen Description TISSUE ABSCESS LEFT LEG  Final   Special Requests PATIENT ON FOLLOWING ANCEF 2G  Final   Gram Stain   Final    MODERATE WBC PRESENT, PREDOMINANTLY PMN ABUNDANT GRAM NEGATIVE RODS MODERATE GRAM POSITIVE COCCI    Culture   Final    ABUNDANT ESCHERICHIA COLI ABUNDANT PSEUDOMONAS AERUGINOSA ABUNDANT [place] MORGANII ABUNDANT PROTEUS MIRABILIS ABUNDANT PREVOTELLA BIVIA BETA LACTAMASE POSITIVE MODERATE VANCOMYCIN RESISTANT ENTEROCOCCUS    Report Status 06/02/2017 FINAL  Final   Organism ID, Bacteria ESCHERICHIA COLI  Final   Organism ID, Bacteria PSEUDOMONAS AERUGINOSA  Final   Organism ID, Bacteria MORGANELLA MORGANII  Final   Organism ID, Bacteria PROTEUS MIRABILIS  Final   Organism ID, Bacteria VANCOMYCIN RESISTANT ENTEROCOCCUS  Final      Susceptibility   Escherichia coli - MIC*    AMPICILLIN 4 SENSITIVE Sensitive     CEFAZOLIN <=4 SENSITIVE Sensitive     CEFEPIME <=1 SENSITIVE Sensitive     CEFTAZIDIME <=1 SENSITIVE Sensitive     CEFTRIAXONE <=1 SENSITIVE Sensitive     CIPROFLOXACIN <=0.25 SENSITIVE Sensitive     GENTAMICIN <=1 SENSITIVE Sensitive     IMIPENEM <=0.25 SENSITIVE Sensitive     TRIMETH/SULFA <=20 SENSITIVE Sensitive     AMPICILLIN/SULBACTAM <=2 SENSITIVE Sensitive     PIP/TAZO <=4 SENSITIVE Sensitive     Extended ESBL NEGATIVE Sensitive     * ABUNDANT ESCHERICHIA COLI   Morganella morganii - MIC*    AMPICILLIN >=32 RESISTANT Resistant     CEFAZOLIN >=64 RESISTANT Resistant     CEFEPIME <=1 SENSITIVE Sensitive      CEFTAZIDIME <=1 SENSITIVE Sensitive     CEFTRIAXONE <=1 SENSITIVE Sensitive     CIPROFLOXACIN <=0.25 SENSITIVE Sensitive     GENTAMICIN <=1 SENSITIVE Sensitive     IMIPENEM 4 SENSITIVE Sensitive     TRIMETH/SULFA <=20 SENSITIVE Sensitive     AMPICILLIN/SULBACTAM 16 INTERMEDIATE Intermediate     PIP/TAZO <=4 SENSITIVE Sensitive     * ABUNDANT MORGANELLA MORGANII   Pseudomonas aeruginosa - MIC*    CEFTAZIDIME <=1 SENSITIVE Sensitive     CIPROFLOXACIN <=0.25 SENSITIVE Sensitive     GENTAMICIN <=1 SENSITIVE Sensitive     IMIPENEM 2 SENSITIVE Sensitive     PIP/TAZO <=4 SENSITIVE Sensitive     CEFEPIME <=1 SENSITIVE Sensitive     * ABUNDANT PSEUDOMONAS AERUGINOSA   Proteus mirabilis - MIC*    AMPICILLIN <=2 SENSITIVE Sensitive     CEFAZOLIN 8 SENSITIVE Sensitive     CEFEPIME <=1 SENSITIVE Sensitive     CEFTAZIDIME <=1 SENSITIVE Sensitive     CEFTRIAXONE <=1 SENSITIVE Sensitive  CIPROFLOXACIN <=0.25 SENSITIVE Sensitive     GENTAMICIN <=1 SENSITIVE Sensitive     IMIPENEM 4 SENSITIVE Sensitive     TRIMETH/SULFA <=20 SENSITIVE Sensitive     AMPICILLIN/SULBACTAM <=2 SENSITIVE Sensitive     PIP/TAZO <=4 SENSITIVE Sensitive     * ABUNDANT PROTEUS MIRABILIS   Vancomycin resistant enterococcus - MIC*    AMPICILLIN >=32 RESISTANT Resistant     VANCOMYCIN >=32 RESISTANT Resistant     GENTAMICIN SYNERGY RESISTANT Resistant     LINEZOLID 2 SENSITIVE Sensitive     * MODERATE VANCOMYCIN RESISTANT ENTEROCOCCUS    Anti-infectives:  Anti-infectives    Start     Dose/Rate Route Frequency Ordered Stop   06/15/2017 0930  ceFAZolin (ANCEF) IVPB 2g/100 mL premix     2 g 200 mL/hr over 30 Minutes Intravenous On call to O.R. 06/03/17 1214 05/25/2017 1006   06/02/17 2200  linezolid (ZYVOX) IVPB 600 mg    Comments:  Pharmacy may adjust dose prn   600 mg 300 mL/hr over 60 Minutes Intravenous Every 12 hours 06/02/17 1546     06/02/17 1545  linezolid (ZYVOX) IVPB 600 mg  Status:  Discontinued     600  mg 300 mL/hr over 60 Minutes Intravenous Every 12 hours 06/02/17 1543 06/02/17 1546   05/31/17 1500  vancomycin (VANCOCIN) 1,250 mg in sodium chloride 0.9 % 250 mL IVPB  Status:  Discontinued     1,250 mg 166.7 mL/hr over 90 Minutes Intravenous Every 24 hours 06/03/2017 1409 06/03/17 0845   06/05/2017 2000  piperacillin-tazobactam (ZOSYN) IVPB 3.375 g     3.375 g 12.5 mL/hr over 240 Minutes Intravenous Every 8 hours 05/25/2017 1409     06/10/2017 1300  vancomycin (VANCOCIN) 2,000 mg in sodium chloride 0.9 % 500 mL IVPB     2,000 mg 250 mL/hr over 120 Minutes Intravenous  Once 06/16/2017 1208 05/31/2017 1657   05/24/2017 1300  piperacillin-tazobactam (ZOSYN) IVPB 3.375 g     3.375 g 100 mL/hr over 30 Minutes Intravenous  Once 05/21/2017 1208 05/26/2017 1334   05/31/2017 0745  ceFAZolin (ANCEF) IVPB 2g/100 mL premix  Status:  Discontinued     2 g 200 mL/hr over 30 Minutes Intravenous On call to O.R. 06/13/2017 0736 06/16/2017 1143   06/06/2017 1800  ceFAZolin (ANCEF) IVPB 2g/100 mL premix  Status:  Discontinued     2 g 200 mL/hr over 30 Minutes Intravenous Every 8 hours 05/23/2017 1220 05/19/2017 1210   05/26/2017 0945  ceFAZolin (ANCEF) IVPB 2g/100 mL premix     2 g 200 mL/hr over 30 Minutes Intravenous To Surgery 05/27/17 2001 06/02/2017 1015   05/20/2017 2000  ceFAZolin (ANCEF) IVPB 2g/100 mL premix     2 g 200 mL/hr over 30 Minutes Intravenous Every 6 hours 05/22/2017 1643 05/24/17 1009   06/11/2017 1015  ceFAZolin (ANCEF) IVPB 2g/100 mL premix     2 g 200 mL/hr over 30 Minutes Intravenous On call to O.R. 06/16/2017 1014 06/03/2017 1400      Best Practice/Protocols:  GI Prophylaxis: Proton Pump Inhibitor  Events:  Subjective:    Overnight Issues: Hgb noted at 8.2 this morning - down from 9.8 last night. Repeat labs pending. Currently intubated but responsive and following commands. Still having maroon BMs; NG output has been <100cc overnight. Anuric.  Objective:  Vital signs for last 24 hours: Temp:  [98.1 F (36.7  C)-99.1 F (37.3 C)] 98.4 F (36.9 C) (10/19 0344) Pulse Rate:  [52-114] 52 (10/19 0719) Resp:  [  0-30] 26 (10/19 0719) BP: (59-116)/(24-83) 116/41 (10/19 0719) SpO2:  [84 %-98 %] 92 % (10/19 0719) Arterial Line BP: (79-158)/(34-79) 108/36 (10/19 0700) FiO2 (%):  [60 %] 60 % (10/19 0719) Weight:  [118.8 kg (262 lb)] 118.8 kg (262 lb) (10/19 0400)  Hemodynamic parameters for last 24 hours: CVP:  [2 mmHg-26 mmHg] 11 mmHg CO:  [3.8 L/min-4.8 L/min] 4.5 L/min CI:  [2.1 L/min/m2-2.4 L/min/m2] 2.1 L/min/m2  Intake/Output from previous day: 10/18 0701 - 10/19 0700 In: 8863.4 [I.V.:4666.1; Blood:3512.2; IV ZOXWRUEAV:409] Out: 8119 [Urine:7; Drains:1795]  Intake/Output this shift: Total I/O In: 50 [IV Piggyback:50] Out: -   Vent settings for last 24 hours: Vent Mode: PRVC FiO2 (%):  [60 %] 60 % Set Rate:  [26 bmp] 26 bmp Vt Set:  [450 mL] 450 mL PEEP:  [5 cmH20] 5 cmH20 Plateau Pressure:  [20 cmH20-26 cmH20] 26 cmH20  Physical Exam:  General: alert Neuro: alert and nonfocal exam CVS: Irregular rate, irregular rhythm GI: Abd: Obese, soft, appropriately ttp around incision but no r/g; JP in place with output 1.4L 600 over last 12hrs; output is translucent brown fluid - does not appear frankly bilious nor bloody.  Results for orders placed or performed during the hospital encounter of 06/14/2017 (from the past 24 hour(s))  Prepare Pheresed Platelets     Status: None (Preliminary result)   Collection Time: 06/05/17  8:15 AM  Result Value Ref Range   Unit Number [id number]    Blood Component Type PLTPHER LR2    Unit division 00    Status of Unit ISSUED    Transfusion Status OK TO TRANSFUSE   Prepare cryoprecipitate     Status: None (Preliminary result)   Collection Time: 06/05/17  8:16 AM  Result Value Ref Range   Unit Number [id number]    Blood Component Type CRYPOOL THAW    Unit division 00    Status of Unit ISSUED    Transfusion Status OK TO TRANSFUSE   CBC      Status: Abnormal   Collection Time: 06/05/17  8:20 AM  Result Value Ref Range   WBC 42.2 (H) 4.0 - 10.5 K/uL   RBC 4.91 3.87 - 5.11 MIL/uL   Hemoglobin 14.7 12.0 - 15.0 g/dL   HCT 43.6 36.0 - 46.0 %   MCV 88.8 78.0 - 100.0 fL   MCH 29.9 26.0 - 34.0 pg   MCHC 33.7 30.0 - 36.0 g/dL   RDW 15.7 (H) 11.5 - 15.5 %   Platelets 61 (L) 150 - 400 K/uL  Basic metabolic panel     Status: Abnormal   Collection Time: 06/05/17  8:20 AM  Result Value Ref Range   Sodium 137 135 - 145 mmol/L   Potassium 4.3 3.5 - 5.1 mmol/L   Chloride 110 101 - 111 mmol/L   CO2 15 (L) 22 - 32 mmol/L   Glucose, Bld 204 (H) 65 - 99 mg/dL   BUN 27 (H) 6 - 20 mg/dL   Creatinine, Ser 1.37 (H) 0.44 - 1.00 mg/dL   Calcium 8.9 8.9 - 10.3 mg/dL   GFR calc non Af Amer 35 (L) >60 mL/min   GFR calc Af Amer 41 (L) >60 mL/min   Anion gap 12 5 - 15  Protime-INR     Status: Abnormal   Collection Time: 06/05/17  8:20 AM  Result Value Ref Range   Prothrombin Time 26.7 (H) 11.4 - 15.2 seconds   INR 2.48   I-STAT 3, arterial blood gas (  G3+)     Status: Abnormal   Collection Time: 06/05/17  8:27 AM  Result Value Ref Range   pH, Arterial 7.110 (LL) 7.350 - 7.450   pCO2 arterial 45.6 32.0 - 48.0 mmHg   pO2, Arterial 76.0 (L) 83.0 - 108.0 mmHg   Bicarbonate 14.5 (L) 20.0 - 28.0 mmol/L   TCO2 16 (L) 22 - 32 mmol/L   O2 Saturation 89.0 %   Acid-base deficit 15.0 (H) 0.0 - 2.0 mmol/L   Patient temperature HIDE    Sample type ARTERIAL   Lactic acid, plasma     Status: Abnormal   Collection Time: 06/05/17 11:54 AM  Result Value Ref Range   Lactic Acid, Venous 6.2 (HH) 0.5 - 1.9 mmol/L  CBC     Status: Abnormal   Collection Time: 06/05/17 12:08 PM  Result Value Ref Range   WBC 32.9 (H) 4.0 - 10.5 K/uL   RBC 4.44 3.87 - 5.11 MIL/uL   Hemoglobin 13.1 12.0 - 15.0 g/dL   HCT 38.5 36.0 - 46.0 %   MCV 86.7 78.0 - 100.0 fL   MCH 29.5 26.0 - 34.0 pg   MCHC 34.0 30.0 - 36.0 g/dL   RDW 15.4 11.5 - 15.5 %   Platelets 70 (L) 150 - 400  K/uL  Basic metabolic panel     Status: Abnormal   Collection Time: 06/05/17 12:08 PM  Result Value Ref Range   Sodium 140 135 - 145 mmol/L   Potassium 4.1 3.5 - 5.1 mmol/L   Chloride 111 101 - 111 mmol/L   CO2 20 (L) 22 - 32 mmol/L   Glucose, Bld 222 (H) 65 - 99 mg/dL   BUN 27 (H) 6 - 20 mg/dL   Creatinine, Ser 1.34 (H) 0.44 - 1.00 mg/dL   Calcium 7.5 (L) 8.9 - 10.3 mg/dL   GFR calc non Af Amer 36 (L) >60 mL/min   GFR calc Af Amer 42 (L) >60 mL/min   Anion gap 9 5 - 15  Troponin I     Status: Abnormal   Collection Time: 06/05/17 12:08 PM  Result Value Ref Range   Troponin I 0.38 (HH) <0.03 ng/mL  Fibrinogen (coagulopathy lab panel)     Status: None   Collection Time: 06/05/17 12:25 PM  Result Value Ref Range   Fibrinogen 221 210 - 475 mg/dL  Protime-INR (coagulopathy lab panel)     Status: Abnormal   Collection Time: 06/05/17 12:25 PM  Result Value Ref Range   Prothrombin Time 26.3 (H) 11.4 - 15.2 seconds   INR 2.44   APTT (coagulopathy lab panel)     Status: None   Collection Time: 06/05/17 12:25 PM  Result Value Ref Range   aPTT 36 24 - 36 seconds  I-STAT 3, arterial blood gas (G3+)     Status: Abnormal   Collection Time: 06/05/17  1:32 PM  Result Value Ref Range   pH, Arterial 7.394 7.350 - 7.450   pCO2 arterial 32.4 32.0 - 48.0 mmHg   pO2, Arterial 58.0 (L) 83.0 - 108.0 mmHg   Bicarbonate 19.8 (L) 20.0 - 28.0 mmol/L   TCO2 21 (L) 22 - 32 mmol/L   O2 Saturation 90.0 %   Acid-base deficit 4.0 (H) 0.0 - 2.0 mmol/L   Patient temperature HIDE    Sample type ARTERIAL   Prepare fresh frozen plasma     Status: None (Preliminary result)   Collection Time: 06/05/17  1:47 PM  Result Value Ref Range  Unit Number [id number]    Blood Component Type FFPT, PHER 1    Unit division 00    Status of Unit ISSUED    Transfusion Status OK TO TRANSFUSE    Unit Number [id number]    Blood Component Type FFPT, PHER 1    Unit division 00    Status of Unit ISSUED    Transfusion  Status OK TO TRANSFUSE    Unit Number [id number]    Blood Component Type FP24, THAWED    Unit division 00    Status of Unit ISSUED    Transfusion Status OK TO TRANSFUSE    Unit Number [id number]    Blood Component Type FP24, THAWED    Unit division 00    Status of Unit ISSUED    Transfusion Status OK TO TRANSFUSE   Prepare Pheresed Platelets     Status: None (Preliminary result)   Collection Time: 06/05/17  1:47 PM  Result Value Ref Range   Unit Number [id number]    Blood Component Type PLTPHER LR1    Unit division 00    Status of Unit ISSUED    Transfusion Status OK TO TRANSFUSE   CBC     Status: Abnormal   Collection Time: 06/05/17  3:23 PM  Result Value Ref Range   WBC 30.9 (H) 4.0 - 10.5 K/uL   RBC 4.61 3.87 - 5.11 MIL/uL   Hemoglobin 13.7 12.0 - 15.0 g/dL   HCT 39.7 36.0 - 46.0 %   MCV 86.1 78.0 - 100.0 fL   MCH 29.7 26.0 - 34.0 pg   MCHC 34.5 30.0 - 36.0 g/dL   RDW 15.5 11.5 - 15.5 %   Platelets 85 (L) 150 - 400 K/uL  Basic metabolic panel     Status: Abnormal   Collection Time: 06/05/17  3:23 PM  Result Value Ref Range   Sodium 137 135 - 145 mmol/L   Potassium 4.1 3.5 - 5.1 mmol/L   Chloride 105 101 - 111 mmol/L   CO2 20 (L) 22 - 32 mmol/L   Glucose, Bld 219 (H) 65 - 99 mg/dL   BUN 29 (H) 6 - 20 mg/dL   Creatinine, Ser 1.45 (H) 0.44 - 1.00 mg/dL   Calcium 7.4 (L) 8.9 - 10.3 mg/dL   GFR calc non Af Amer 33 (L) >60 mL/min   GFR calc Af Amer 38 (L) >60 mL/min   Anion gap 12 5 - 15  I-STAT 3, arterial blood gas (G3+)     Status: Abnormal   Collection Time: 06/05/17  5:49 PM  Result Value Ref Range   pH, Arterial 7.438 7.350 - 7.450   pCO2 arterial 37.3 32.0 - 48.0 mmHg   pO2, Arterial 65.0 (L) 83.0 - 108.0 mmHg   Bicarbonate 25.2 20.0 - 28.0 mmol/L   TCO2 26 22 - 32 mmol/L   O2 Saturation 93.0 %   Acid-Base Excess 1.0 0.0 - 2.0 mmol/L   Patient temperature HIDE    Sample type ARTERIAL   Glucose, capillary     Status: Abnormal   Collection Time:  06/05/17  8:27 PM  Result Value Ref Range   Glucose-Capillary 152 (H) 65 - 99 mg/dL   Comment 1 Notify RN   CBC     Status: Abnormal   Collection Time: 06/05/17  9:11 PM  Result Value Ref Range   WBC 22.8 (H) 4.0 - 10.5 K/uL   RBC 3.34 (L) 3.87 - 5.11 MIL/uL   Hemoglobin 10.0 (L) 12.0 -  15.0 g/dL   HCT 28.7 (L) 36.0 - 46.0 %   MCV 85.9 78.0 - 100.0 fL   MCH 29.9 26.0 - 34.0 pg   MCHC 34.8 30.0 - 36.0 g/dL   RDW 15.5 11.5 - 15.5 %   Platelets 88 (L) 150 - 400 K/uL  Basic metabolic panel     Status: Abnormal   Collection Time: 06/05/17  9:11 PM  Result Value Ref Range   Sodium 139 135 - 145 mmol/L   Potassium 3.4 (L) 3.5 - 5.1 mmol/L   Chloride 104 101 - 111 mmol/L   CO2 25 22 - 32 mmol/L   Glucose, Bld 193 (H) 65 - 99 mg/dL   BUN 32 (H) 6 - 20 mg/dL   Creatinine, Ser 1.52 (H) 0.44 - 1.00 mg/dL   Calcium 7.8 (L) 8.9 - 10.3 mg/dL   GFR calc non Af Amer 31 (L) >60 mL/min   GFR calc Af Amer 36 (L) >60 mL/min   Anion gap 10 5 - 15  Provider-confirm verbal Blood Bank order - RBC; 4 Units; Order taken: 06/18/2017; 8:40 PM; Surgery; 4 units ahead Verbal order for 4 units RBCs and keep 4 ahead during surgery, 2 units transfused during surgery     Status: None   Collection Time: 06/05/17 10:00 PM  Result Value Ref Range   Blood product order confirm MD AUTHORIZATION REQUESTED   Prepare fresh frozen plasma     Status: None (Preliminary result)   Collection Time: 06/05/17 10:48 PM  Result Value Ref Range   Unit Number [id number]    Blood Component Type THAWED PLASMA    Unit division 00    Status of Unit ISSUED    Transfusion Status OK TO TRANSFUSE    Unit Number [id number]    Blood Component Type THAWED PLASMA    Unit division 00    Status of Unit ISSUED    Transfusion Status OK TO TRANSFUSE    Unit Number [id number]    Blood Component Type THAWED PLASMA    Unit division 00    Status of Unit ISSUED    Transfusion Status OK TO TRANSFUSE   CBC     Status: Abnormal    Collection Time: 06/05/17 10:52 PM  Result Value Ref Range   WBC 21.3 (H) 4.0 - 10.5 K/uL   RBC 3.26 (L) 3.87 - 5.11 MIL/uL   Hemoglobin 9.8 (L) 12.0 - 15.0 g/dL   HCT 27.9 (L) 36.0 - 46.0 %   MCV 85.6 78.0 - 100.0 fL   MCH 30.1 26.0 - 34.0 pg   MCHC 35.1 30.0 - 36.0 g/dL   RDW 15.5 11.5 - 15.5 %   Platelets 73 (L) 150 - 400 K/uL  Fibrinogen     Status: None   Collection Time: 06/05/17 10:52 PM  Result Value Ref Range   Fibrinogen 255 210 - 475 mg/dL  Lactic acid, plasma     Status: Abnormal   Collection Time: 06/05/17 10:52 PM  Result Value Ref Range   Lactic Acid, Venous 4.2 (HH) 0.5 - 1.9 mmol/L  DIC (disseminated intravasc coag) panel     Status: Abnormal   Collection Time: 06/05/17 11:03 PM  Result Value Ref Range   Prothrombin Time 18.5 (H) 11.4 - 15.2 seconds   INR 1.55    aPTT 34 24 - 36 seconds   Fibrinogen 246 210 - 475 mg/dL   D-Dimer, Quant 10.89 (H) 0.00 - 0.50 ug/mL-FEU   Platelets 80 (L) 150 -  400 K/uL   Smear Review NO SCHISTOCYTES SEEN   Blood gas, arterial     Status: Abnormal (Preliminary result)   Collection Time: 06/05/17 11:18 PM  Result Value Ref Range   FIO2 60.00    O2 Content PENDING L/min   Delivery systems VENTILATOR    Mode PRESSURE REGULATED VOLUME CONTROL    VT 450 mL   LHR 26.0 resp/min   Peep/cpap 5.0 cm H20   pH, Arterial 7.473 (H) 7.350 - 7.450   pCO2 arterial 36.6 32.0 - 48.0 mmHg   pO2, Arterial 60.6 (L) 83.0 - 108.0 mmHg   Bicarbonate 26.5 20.0 - 28.0 mmol/L   Acid-Base Excess 3.0 (H) 0.0 - 2.0 mmol/L   O2 Saturation 93.8 %   Patient temperature 98.6    Collection site ARTERIAL LINE    Drawn by [id number]    Sample type ARTERIAL DRAW    Allens test (pass/fail) PASS PASS  Prepare Pheresed Platelets     Status: None (Preliminary result)   Collection Time: 06/05/17 11:59 PM  Result Value Ref Range   Unit Number [id number]    Blood Component Type PLTP LR2 PAS    Unit division 00    Status of Unit ISSUED    Transfusion Status OK  TO TRANSFUSE   CBC     Status: Abnormal   Collection Time: 06/03/2017  2:39 AM  Result Value Ref Range   WBC 18.4 (H) 4.0 - 10.5 K/uL   RBC 2.78 (L) 3.87 - 5.11 MIL/uL   Hemoglobin 8.2 (L) 12.0 - 15.0 g/dL   HCT 24.1 (L) 36.0 - 46.0 %   MCV 86.7 78.0 - 100.0 fL   MCH 29.5 26.0 - 34.0 pg   MCHC 34.0 30.0 - 36.0 g/dL   RDW 15.6 (H) 11.5 - 15.5 %   Platelets 78 (L) 150 - 400 K/uL  Basic metabolic panel     Status: Abnormal   Collection Time: 05/30/2017  2:39 AM  Result Value Ref Range   Sodium 137 135 - 145 mmol/L   Potassium 3.1 (L) 3.5 - 5.1 mmol/L   Chloride 102 101 - 111 mmol/L   CO2 25 22 - 32 mmol/L   Glucose, Bld 137 (H) 65 - 99 mg/dL   BUN 33 (H) 6 - 20 mg/dL   Creatinine, Ser 1.64 (H) 0.44 - 1.00 mg/dL   Calcium 7.6 (L) 8.9 - 10.3 mg/dL   GFR calc non Af Amer 28 (L) >60 mL/min   GFR calc Af Amer 33 (L) >60 mL/min   Anion gap 10 5 - 15  Blood gas, arterial     Status: Abnormal   Collection Time: 06/11/2017  5:13 AM  Result Value Ref Range   FIO2 60.00    Delivery systems VENTILATOR    Mode PRESSURE REGULATED VOLUME CONTROL    VT 450 mL   LHR 26 resp/min   Peep/cpap 5.0 cm H20   pH, Arterial 7.477 (H) 7.350 - 7.450   pCO2 arterial 37.1 32.0 - 48.0 mmHg   pO2, Arterial 64.9 (L) 83.0 - 108.0 mmHg   Bicarbonate 27.1 20.0 - 28.0 mmol/L   Acid-Base Excess 3.7 (H) 0.0 - 2.0 mmol/L   O2 Saturation 95.0 %   Patient temperature 98.6    Collection site A-LINE    Drawn by [id number]    Sample type ARTERIAL DRAW    Allens test (pass/fail) PASS PASS  CBC     Status: Abnormal   Collection Time: 05/27/2017  6:15  AM  Result Value Ref Range   WBC 19.1 (H) 4.0 - 10.5 K/uL   RBC 2.78 (L) 3.87 - 5.11 MIL/uL   Hemoglobin 8.4 (L) 12.0 - 15.0 g/dL   HCT 24.0 (L) 36.0 - 46.0 %   MCV 86.3 78.0 - 100.0 fL   MCH 30.2 26.0 - 34.0 pg   MCHC 35.0 30.0 - 36.0 g/dL   RDW 15.8 (H) 11.5 - 15.5 %   Platelets 71 (L) 150 - 400 K/uL  I-STAT 3, arterial blood gas (G3+)     Status: Abnormal   Collection  Time: 06/12/2017  7:27 AM  Result Value Ref Range   pH, Arterial 7.479 (H) 7.350 - 7.450   pCO2 arterial 36.4 32.0 - 48.0 mmHg   pO2, Arterial 62.0 (L) 83.0 - 108.0 mmHg   Bicarbonate 27.0 20.0 - 28.0 mmol/L   TCO2 28 22 - 32 mmol/L   O2 Saturation 93.0 %   Acid-Base Excess 3.0 (H) 0.0 - 2.0 mmol/L   Patient temperature HIDE    Collection site ARTERIAL LINE    Drawn by RT    Sample type ARTERIAL     Assessment & Plan: Present on Admission: . Acute on chronic diastolic CHF (congestive heart failure) ([place]) . Hypotension  63F hx of HFpEF, nec fasciitis of LLE, HLD, pHTN, CKD III, COPD on home O2 (2.5L) - was being managed by ortho for LLE wound and postop developed massive GIB - found to have DU that likely had eroded through GDA - was in hemorrhagic shock, on MTP, taken emergently for exlap 10/17 - duodenotomy with oversewing of the bleeding vessel; closure of duodenum and abdomen  -Flush NG tube with saline -Repeat labs pending -Transfuse to hgb goal of 10 for time being given potential for ongoing bleeding -PPI drip -If rebleed occurs, plan will be for CT angiogram abdomen/pelvis, with potential IR for angiographic embolization -Will continue to monitor closely   LOS: 18 days   Sharon Mt. Dema Severin, M.D. General and Colorectal Surgery [facility]

## 2017-06-07 ENCOUNTER — Inpatient Hospital Stay (HOSPITAL_COMMUNITY): Payer: Medicare Other

## 2017-06-07 LAB — CBC
HCT: 36.8 % (ref 36.0–46.0)
HCT: 33.5 % — ABNORMAL LOW (ref 36.0–46.0)
Hemoglobin: 12.8 g/dL (ref 12.0–15.0)
Hemoglobin: 11.2 g/dL — ABNORMAL LOW (ref 12.0–15.0)
MCH: 30.3 pg (ref 26.0–34.0)
MCH: 29.9 pg (ref 26.0–34.0)
MCHC: 34.8 g/dL (ref 30.0–36.0)
MCHC: 33.4 g/dL (ref 30.0–36.0)
MCV: 87 fL (ref 78.0–100.0)
MCV: 89.3 fL (ref 78.0–100.0)
PLATELETS: 46 10*3/uL — AB (ref 150–400)
PLATELETS: 49 10*3/uL — AB (ref 150–400)
RBC: 4.23 MIL/uL (ref 3.87–5.11)
RBC: 3.75 MIL/uL — ABNORMAL LOW (ref 3.87–5.11)
RDW: 15.9 % — AB (ref 11.5–15.5)
RDW: 16.4 % — AB (ref 11.5–15.5)
WBC: 19.9 10*3/uL — AB (ref 4.0–10.5)
WBC: 21 10*3/uL — ABNORMAL HIGH (ref 4.0–10.5)

## 2017-06-07 LAB — BLOOD GAS, ARTERIAL
ACID-BASE EXCESS: 2.6 mmol/L — AB (ref 0.0–2.0)
ACID-BASE EXCESS: 2.3 mmol/L — AB (ref 0.0–2.0)
BICARBONATE: 24.9 mmol/L (ref 20.0–28.0)
BICARBONATE: 25.4 mmol/L (ref 20.0–28.0)
DRAWN BY: 414221
DRAWN BY: 414221
FIO2: 50
FIO2: 50
LHR: 26 {breaths}/min
MECHVT: 450 mL
MECHVT: 450 mL
O2 SAT: 97.8 %
O2 Saturation: 96.8 %
PATIENT TEMPERATURE: 98.6
PATIENT TEMPERATURE: 98.6
PEEP/CPAP: 5 cmH2O
PEEP/CPAP: 5 cmH2O
PH ART: 7.559 — AB (ref 7.350–7.450)
PH ART: 7.493 — AB (ref 7.350–7.450)
PO2 ART: 81.7 mmHg — AB (ref 83.0–108.0)
PO2 ART: 81.5 mmHg — AB (ref 83.0–108.0)
RATE: 20 resp/min
pCO2 arterial: 27.9 mmHg — ABNORMAL LOW (ref 32.0–48.0)
pCO2 arterial: 33.4 mmHg (ref 32.0–48.0)

## 2017-06-07 LAB — BASIC METABOLIC PANEL
ANION GAP: 12 (ref 5–15)
Anion gap: 13 (ref 5–15)
Anion gap: 9 (ref 5–15)
BUN: 40 mg/dL — AB (ref 6–20)
BUN: 44 mg/dL — AB (ref 6–20)
BUN: 48 mg/dL — AB (ref 6–20)
CALCIUM: 7.8 mg/dL — AB (ref 8.9–10.3)
CHLORIDE: 103 mmol/L (ref 101–111)
CHLORIDE: 103 mmol/L (ref 101–111)
CO2: 23 mmol/L (ref 22–32)
CO2: 27 mmol/L (ref 22–32)
CO2: 26 mmol/L (ref 22–32)
CREATININE: 2.05 mg/dL — AB (ref 0.44–1.00)
Calcium: 7.8 mg/dL — ABNORMAL LOW (ref 8.9–10.3)
Calcium: 7.7 mg/dL — ABNORMAL LOW (ref 8.9–10.3)
Chloride: 104 mmol/L (ref 101–111)
Creatinine, Ser: 2.31 mg/dL — ABNORMAL HIGH (ref 0.44–1.00)
Creatinine, Ser: 2.35 mg/dL — ABNORMAL HIGH (ref 0.44–1.00)
GFR calc Af Amer: 25 mL/min — ABNORMAL LOW (ref >60)
GFR calc Af Amer: 22 mL/min — ABNORMAL LOW (ref >60)
GFR calc Af Amer: 21 mL/min — ABNORMAL LOW (ref >60)
GFR calc non Af Amer: 18 mL/min — ABNORMAL LOW (ref >60)
GFR, EST NON AFRICAN AMERICAN: 22 mL/min — AB (ref >60)
GFR, EST NON AFRICAN AMERICAN: 19 mL/min — AB (ref >60)
GLUCOSE: 82 mg/dL (ref 65–99)
GLUCOSE: 88 mg/dL (ref 65–99)
GLUCOSE: 92 mg/dL (ref 65–99)
POTASSIUM: 3.8 mmol/L (ref 3.5–5.1)
POTASSIUM: 3.9 mmol/L (ref 3.5–5.1)
POTASSIUM: 3.8 mmol/L (ref 3.5–5.1)
SODIUM: 140 mmol/L (ref 135–145)
SODIUM: 138 mmol/L (ref 135–145)
Sodium: 142 mmol/L (ref 135–145)

## 2017-06-07 LAB — GLUCOSE, CAPILLARY
GLUCOSE-CAPILLARY: 111 mg/dL — AB (ref 65–99)
GLUCOSE-CAPILLARY: 88 mg/dL (ref 65–99)
Glucose-Capillary: 84 mg/dL (ref 65–99)
Glucose-Capillary: 82 mg/dL (ref 65–99)
Glucose-Capillary: 81 mg/dL (ref 65–99)

## 2017-06-07 LAB — POCT I-STAT 3, ART BLOOD GAS (G3+)
Acid-Base Excess: 1 mmol/L (ref 0.0–2.0)
BICARBONATE: 26.4 mmol/L (ref 20.0–28.0)
O2 SAT: 95 %
TCO2: 28 mmol/L (ref 22–32)
pCO2 arterial: 42.2 mmHg (ref 32.0–48.0)
pH, Arterial: 7.405 (ref 7.350–7.450)
pO2, Arterial: 75 mmHg — ABNORMAL LOW (ref 83.0–108.0)

## 2017-06-07 LAB — PROTIME-INR
INR: 1.59
PROTHROMBIN TIME: 18.8 s — AB (ref 11.4–15.2)

## 2017-06-07 MED ORDER — SODIUM CHLORIDE 0.9 % IV SOLN
Freq: Once | INTRAVENOUS | Status: AC
  Administered 2017-06-07: 10:00:00 via INTRAVENOUS

## 2017-06-07 MED ORDER — SODIUM CHLORIDE 0.9% FLUSH
10.0000 mL | INTRAVENOUS | Status: DC | PRN
  Administered 2017-06-10 – 2017-06-16 (×4): 10 mL
  Filled 2017-06-07 (×4): qty 40

## 2017-06-07 MED ORDER — FUROSEMIDE 10 MG/ML IJ SOLN
80.0000 mg | Freq: Once | INTRAMUSCULAR | Status: AC
  Administered 2017-06-07: 80 mg via INTRAVENOUS
  Filled 2017-06-07: qty 8

## 2017-06-07 MED ORDER — SODIUM CHLORIDE 0.9 % IV SOLN: Freq: Once | INTRAVENOUS | Status: AC

## 2017-06-07 MED ORDER — SODIUM CHLORIDE 0.9 % IV SOLN
8.0000 mg/h | INTRAVENOUS | Status: AC
  Administered 2017-06-08 – 2017-06-10 (×6): 8 mg/h via INTRAVENOUS
  Filled 2017-06-07 (×14): qty 80

## 2017-06-07 MED ORDER — CHLORHEXIDINE GLUCONATE CLOTH 2 % EX PADS
6.0000 | MEDICATED_PAD | Freq: Every day | CUTANEOUS | Status: DC
  Administered 2017-06-07 – 2017-06-16 (×10): 6 via TOPICAL

## 2017-06-07 NOTE — Progress Notes (Signed)
ETT tube holder changed due to it sliding down pt's face and not being fully secured. Ett remains in good position. Pt stable throughout with no complications. RT will continue to monitor.

## 2017-06-07 NOTE — Progress Notes (Signed)
PULMONARY / CRITICAL CARE MEDICINE   Name: [name] MRN: [id number] DOB: 04-28-1935    ADMISSION DATE:  06/05/2017 CONSULTATION DATE:  06/09/2017  REFERRING MD:  Dr. Sherral Hammers  CHIEF COMPLAINT:  Acute hypotension  HISTORY OF PRESENT ILLNESS:   82 year old female with history of HFpEF, necrotizing fasciitis of left lower leg, HLD, tricuspid regurg, pulmonary hypertension, CKD III, COPD on 2.5L at night presents with hypotension requiring pressor support after repeat left leg wound debridement earlier today (10/17). Patient was in her usual state of health until 9/22 when she presented for traumatic left leg hematoma and hypovolemic shock. She was admitted to the hospital 05/10/17-05/16/2017 and subsequently discharged to SNF where she experienced worsening weakness. She was then readmitted with acute on chronic HFpEF exacerbation on 10/1.  During this hospital stay patient continued to have hemodynamic instability requiring 8 units PRBC over her hospital course. She has been taken back to the OR  On 10/5, 10/10, 10/12, and 10/17 for further wound debridement. Today, after her procedure was completed and wound vac in place, she was in PACU where her blood pressure was noted to drop to 28U systolic. She was given albumin. Neo was initiated. Patient indicated she was dizzy and lightheaded. 3u PRBC were administered. PCCM was called for consult.  Of note patient had grossly bloody stool in the PACU with her hypotensive episode. Bowel movement was described as formed but grossly bloody/in a pool of blood. She denies prior episodes of melena or hematochezia. She endorses being constipated over past few days.   SUBJECTIVE: Overnight patient was noted to be persistently hypoglycemic despite receiving 1.5 amps of D50. She was started on D51/2NS with improvement in sugar. She was also found to have a respiratory alkalosis with pH 7.559, and so respiratory rate was decreased from 26 to 20.  VITAL  SIGNS: BP (!) 119/59   Pulse 65   Temp 98.8 F (37.1 C) (Oral)   Resp (!) 26   Ht 5' 2.99" (1.6 m)   Wt 266 lb (120.7 kg)   SpO2 98%   BMI 47.13 kg/m   HEMODYNAMICS: CVP:  [10 mmHg-15 mmHg] 11 mmHg  VENTILATOR SETTINGS: Vent Mode: PRVC FiO2 (%):  [50 %-60 %] 50 % Set Rate:  [20 bmp-26 bmp] 20 bmp Vt Set:  [450 mL] 450 mL PEEP:  [5 cmH20] 5 cmH20 Plateau Pressure:  [21 cmH20-26 cmH20] 21 cmH20  INTAKE / OUTPUT: I/O last 3 completed shifts: In: 10361.1 [I.V.:5053.8; Blood:4142.2; Other:75; NG/GT:30; IV [id number]:7253] Out: 2567 [Urine:7; Emesis/NG output:365; Drains:2195]  PHYSICAL EXAMINATION: General:  Rests in bed, appears critically ill Neuro:  Following commands, opens eyes to name Cardiovascular:  RRR, no m/r/g Lungs:  CTA anteriorly, MV, no W/R/R Abdomen:  +surgical bandage is clean and dry. Abdomen mildly distended and appropriately tender without rebound or guarding. Musculoskeletal: +L wound vac in place, leg is wrapped, bandage is clean and dry Skin:  No rashes or lesions. Dusky extremities  LABS:  BMET  Recent Labs Lab 06/09/2017 0824 05/30/2017 1540 06/07/17 0203  NA 141 141 140  K 3.7 3.8 3.8  CL 105 108 104  CO2 26 25 23   BUN 36* 37* 40*  CREATININE 1.73* 1.81* 2.05*  GLUCOSE 111* 82 82    Electrolytes  Recent Labs Lab 06/04/17 0356  05/23/2017 0824 05/29/2017 1540 06/07/17 0203  CALCIUM 8.5*  < > 7.9* 7.8* 7.8*  MG 2.0  --   --   --   --   < > =  values in this interval not displayed.  CBC  Recent Labs Lab 05/31/2017 0615 05/28/2017 1545 05/23/2017 2326  WBC 19.1* 22.0* 21.2*  HGB 8.4* 11.5* 11.8*  HCT 24.0* 33.8* 34.7*  PLT 71* 60* 53*    Coag's  Recent Labs Lab 06/05/17 1225 06/05/17 2303 05/30/2017 0719  APTT 36 34 33  INR 2.44 1.55 1.51    Sepsis Markers  Recent Labs Lab 06/05/17 1154 06/05/17 2252  LATICACIDVEN 6.2* 4.2*    ABG  Recent Labs Lab 06/18/2017 2340 06/07/17 0230 06/07/17 0450  PHART 7.535* 7.559*  7.493*  PCO2ART 29.5* 27.9* 33.4  PO2ART 86.7 81.7* 81.5*    Liver Enzymes  Recent Labs Lab 06/03/17 0526 06/05/17 0413  AST 32 2,448*  ALT <5* 389*  ALKPHOS 194* 92  BILITOT 0.6 3.5*  ALBUMIN 1.4* 1.6*    Cardiac Enzymes  Recent Labs Lab 06/15/2017 1520 06/05/17 1208  TROPONINI 0.04* 0.38*    Glucose  Recent Labs Lab 06/02/2017 2345 06/05/17 2027 05/31/2017 1132 05/22/2017 1542 05/27/2017 1641 06/07/17 0425  GLUCAP 70 152* 91 66 86 111*    Imaging No results found.   STUDIES:    CULTURES: Surgical wound culture 10/10 >> E coli, Pseudomonas, Morganella morganii, proteus, VRE  ANTIBIOTICS: 10/10 Ancef >> 10/12 10/15 Linezolid >> 10/12 Zosyn >>  SIGNIFICANT EVENTS: 10/5 Left thigh and left leg debridement 10/10 Repeat irrigation and debridement left thigh and left leg 10/12 Debridement left leg and thigh 10/17 Debridement left thigh wound 10/17 EGD, exploratory laparotomy with bleeding duodenal ulcer/perforated duodenum. Intubated, maxed on 4 pressors in ICU.  LINES/TUBES: PIV x3 Foley catheter Negative pressure wound L leg  Closed system drain RLQ bulb CVC triple lumen NG/OG  Labs/Studies CVP 11 ABG 7.559/27.9/81.7 Cr 2.05, BUN 40 Hgb 11.8   DISCUSSION: 82 year old female with history of HFpEF, necrotizing fasciitis of left lower leg, HLD, tricuspid regurg, pulmonary hypertension, CKD III, COPD on 2L at night, presents after repeat left lower extremity debridement with acute hypotension. Subsequently found to have duodenal ulcer with perforation, in hemorrhagic shock. Maxed on 4 pressors in ICU, intubated.  ASSESSMENT / PLAN:  GASTROINTESTINAL A:   Acute GI bleed 2/2 duodenal ulcer with hemorrhagic shock Perforated duodenum with extravasation s/p ex lap Shock liver; ALT 389, AST 2448 P:   Surgery, GI following PPI gtt Monitor LFTs Check Hgb q8H Follow coags  PULMONARY A: Pulmonary Hypertension - [facility] 11/2015 with PAH 59/17  mmHg Mechanically ventilated P:   Holding home lasix in setting of shock Hold letaris  CARDIOVASCULAR A:  HFpEF (Last echo 05/21/2017 indicates EF 60-65%, moderate MR, mod pulm HTN) with intermittent episodes of hypotension. Paroxysmal Atrial fibrillation CAD Elevated troponin Hypovolemic shock requiring massive blood transfusion P:  Continue pressure support, goal MAP >60.  Currently on levophed at 11.3 Monitor intake/output CVP q8H   RENAL A:   AKI on CKD stage III (baseline Cr 0.8-1.1), worsening Continues to be oliguric Respiratory alkalosis >> Respiration rate turned down overnight P:   Trend BMP Replete potassium as needed Monitor Calcium q8H, replete as needed Monitor for acidosis with ABG q4H No UOP, monitor fluid status  HEMATOLOGIC A:   Hemorrhagic shock requiring massive blood transfusions Acute blood loss anemia S/p 10 units of PRBC since moving to PCCM service. Also received 1u cryo, 3u platelets, 7u ffp. Last transfusion of PRBC was 10/19 AM. P:  q8H CBC >> Hgb currently stable Pressure support as noted above  INFECTIOUS A:   Surgical wound culture  10/10 >> E coli, Pseudomonas, Morganella morganii, proteus, VRE P:   Continue linezolid (started 10/15), Zosyn (started 10/12) Follow wound cultures Monitor fever curve, leukocytosis (worsened with steroids given 10/18)  ENDOCRINE A:   Overweight Hypoglycemia - 66 overnight P:   S/p 1.5 amp D50 and started on D5NS at 50 ml/hr >> Glucose improved to 82 on AM Bmet  NEUROLOGIC A:   Remains neurologically intact, no acute process  P:   Monitor mental status  FAMILY  - Inter-disciplinary family meet or Palliative Care meeting due by:  06/11/2017  Everrett Coombe, MD PGY-2 [facility]  06/07/2017 6:14 AM

## 2017-06-07 NOTE — Progress Notes (Signed)
RT lowered rate from 26 to 20, verbal order from elink and order is in place. Rt to cont to monitor.

## 2017-06-07 NOTE — Progress Notes (Signed)
Progress Note: General Surgery Service   Assessment/Plan: Patient Active Problem List   Diagnosis Date Noted  . AKI (acute kidney injury) ([place])   . UGIB (upper gastrointestinal bleed)   . Hemorrhagic shock ([place])   . Acute hypoxemic respiratory failure ([place])   . Pressure injury of skin 06/14/2017  . Necrotizing fasciitis of lower leg ([place])   . Traumatic hematoma of left lower leg   . Atherosclerosis of native arteries of extremities with gangrene, left leg ([place])   . Anemia   . Elevated troponin 06/12/2017  . Hypotension   . Acute blood loss anemia 05/11/2017  . CKD (chronic kidney disease), stage III ([place]) 05/10/2017  . Thigh hematoma, left, initial encounter 05/10/2017  . Leg hematoma, left, initial encounter 05/10/2017  . Chronic respiratory failure ([place]) 07/22/2016  . Chronic rhinitis 02/08/2016  . Acute on chronic diastolic CHF (congestive heart failure) ([place]) 12/28/2015  . PAH (pulmonary artery hypertension) ([place])   . CHF (congestive heart failure) ([place]) 11/17/2015  . Acute congestive heart failure ([place]) 11/17/2015  . Pulmonary nodules 10/07/2013  . Encounter for therapeutic drug monitoring 09/22/2013  . DOE (dyspnea on exertion) 05/26/2012  . Psoriasis   . Carotid bruit   . Warfarin anticoagulation   . CAD (coronary artery disease)   . Pulmonary hypertension ([place]) 04/19/2011  . Atrial fibrillation ([place]) 11/16/2010  . GERD 04/17/2010  . COLONIC POLYPS, HX OF 04/17/2010  . Hyperlipidemia 04/17/2009  . Overweight(278.02) 04/17/2009  . Essential hypertension 04/17/2009   s/p Procedure(s): EXPLORATORY LAPAROTOMY REPAIR OF DUODENAL ULCER 06/18/2017  67F hx of HFpEF, nec fasciitis of LLE, HLD, pHTN, CKD III, COPD on home O2 (2.5L) - was being managed by ortho for LLE wound and postop developed massive GIB - found to have DU that likely had eroded through GDA - was in hemorrhagic shock, on MTP, taken emergently for exlap 10/17 - duodenotomy with oversewing of the bleeding  vessel; closure of duodenum and abdomen  -Flush NG tube with saline - Hgb 12.8 today, stable -pressors weaned to levo only -Transfuse to hgb goal of 10 for time being given potential for ongoing bleeding -PPI drip -If rebleed occurs, plan will be for CT angiogram abdomen/pelvis, with potential IR for angiographic embolization -Will continue to monitor closely    LOS: 19 days  Chief Complaint/Subjective: No events overnight  Objective: Vital signs in last 24 hours: Temp:  [97.4 F (36.3 C)-99.2 F (37.3 C)] 99.2 F (37.3 C) (10/20 0820) Pulse Rate:  [50-80] 56 (10/20 0736) Resp:  [20-26] 20 (10/20 0736) BP: (94-132)/(36-71) 105/71 (10/20 0736) SpO2:  [93 %-100 %] 99 % (10/20 0736) Arterial Line BP: (90-138)/(33-78) 101/69 (10/20 0700) FiO2 (%):  [40 %-60 %] 40 % (10/20 0736) Weight:  [120.7 kg (266 lb)] 120.7 kg (266 lb) (10/20 0400) Last BM Date: 06/10/2017  Intake/Output from previous day: 10/19 0701 - 10/20 0700 In: 2910.7 [I.V.:1450.7; Blood:630; NG/GT:30; IV [id number]:825] Out: 0539 [Urine:4; Emesis/NG output:565; Drains:1095] Intake/Output this shift: No intake/output data recorded.  Lungs: assisted, CTAB  Cardiovascular: bradycardic  Abd: open wound with clean base, with obesity, not distended  Neuro: sedated, opens eyes spontaneously  Lab Results: CBC   Recent Labs  05/25/2017 2326 06/07/17 0800  WBC 21.2* 19.9*  HGB 11.8* 12.8  HCT 34.7* 36.8  PLT 53* 46*   BMET  Recent Labs  06/18/2017 1540 06/07/17 0203  NA 141 140  K 3.8 3.8  CL 108 104  CO2 25 23  GLUCOSE 82 82  BUN  37* 40*  CREATININE 1.81* 2.05*  CALCIUM 7.8* 7.8*   PT/INR  Recent Labs  06/08/2017 0719 06/07/17 0500  LABPROT 18.1* 18.8*  INR 1.51 1.59   ABG  Recent Labs  06/07/17 0230 06/07/17 0450  PHART 7.559* 7.493*  HCO3 24.9 25.4    Studies/Results:  Anti-infectives: Anti-infectives    Start     Dose/Rate Route Frequency Ordered Stop   05/27/2017 0930  ceFAZolin  (ANCEF) IVPB 2g/100 mL premix     2 g 200 mL/hr over 30 Minutes Intravenous On call to O.R. 06/03/17 1214 06/05/2017 1006   06/02/17 2200  linezolid (ZYVOX) IVPB 600 mg    Comments:  Pharmacy may adjust dose prn   600 mg 300 mL/hr over 60 Minutes Intravenous Every 12 hours 06/02/17 1546     06/02/17 1545  linezolid (ZYVOX) IVPB 600 mg  Status:  Discontinued     600 mg 300 mL/hr over 60 Minutes Intravenous Every 12 hours 06/02/17 1543 06/02/17 1546   05/31/17 1500  vancomycin (VANCOCIN) 1,250 mg in sodium chloride 0.9 % 250 mL IVPB  Status:  Discontinued     1,250 mg 166.7 mL/hr over 90 Minutes Intravenous Every 24 hours 05/22/2017 1409 06/03/17 0845   05/21/2017 2000  piperacillin-tazobactam (ZOSYN) IVPB 3.375 g     3.375 g 12.5 mL/hr over 240 Minutes Intravenous Every 8 hours 06/12/2017 1409     05/21/2017 1300  vancomycin (VANCOCIN) 2,000 mg in sodium chloride 0.9 % 500 mL IVPB     2,000 mg 250 mL/hr over 120 Minutes Intravenous  Once 06/12/2017 1208 05/21/2017 1657   05/29/2017 1300  piperacillin-tazobactam (ZOSYN) IVPB 3.375 g     3.375 g 100 mL/hr over 30 Minutes Intravenous  Once 06/07/2017 1208 06/15/2017 1334   06/16/2017 0745  ceFAZolin (ANCEF) IVPB 2g/100 mL premix  Status:  Discontinued     2 g 200 mL/hr over 30 Minutes Intravenous On call to O.R. 05/22/2017 0736 05/25/2017 1143   06/03/2017 1800  ceFAZolin (ANCEF) IVPB 2g/100 mL premix  Status:  Discontinued     2 g 200 mL/hr over 30 Minutes Intravenous Every 8 hours 06/11/2017 1220 06/16/2017 1210   05/27/2017 0945  ceFAZolin (ANCEF) IVPB 2g/100 mL premix     2 g 200 mL/hr over 30 Minutes Intravenous To Surgery 05/27/17 2001 06/10/2017 1015   06/01/2017 2000  ceFAZolin (ANCEF) IVPB 2g/100 mL premix     2 g 200 mL/hr over 30 Minutes Intravenous Every 6 hours 06/10/2017 1643 05/24/17 1009   06/11/2017 1015  ceFAZolin (ANCEF) IVPB 2g/100 mL premix     2 g 200 mL/hr over 30 Minutes Intravenous On call to O.R. 06/09/2017 1014 06/05/2017 1400       Medications: Scheduled Meds: . brimonidine  1 drop Right Eye BID  . brinzolamide  1 drop Both Eyes BID  . chlorhexidine gluconate (MEDLINE KIT)  15 mL Mouth Rinse BID  . docusate sodium  100 mg Oral BID  . Gerhardt's butt cream   Topical BID  . latanoprost  1 drop Right Eye QHS  . mouth rinse  15 mL Mouth Rinse QID  . sodium chloride flush  3 mL Intravenous Q12H  . timolol  1 drop Both Eyes BID   Continuous Infusions: . sodium chloride    . dextrose 5 % and 0.9% NaCl 50 mL/hr at 05/24/2017 1950  . lactated ringers 10 mL/hr at 05/31/2017 1800  . linezolid (ZYVOX) IV Stopped (06/11/2017 2244)  . methocarbamol (ROBAXIN)  IV    .  norepinephrine (LEVOPHED) Adult infusion 10 mcg/min (06/07/17 6986)  . pantoprozole (PROTONIX) infusion 8 mg/hr (06/07/17 0406)  . piperacillin-tazobactam (ZOSYN)  IV Stopped (06/07/17 0823)   PRN Meds:.sodium chloride, acetaminophen **OR** acetaminophen, bisacodyl, fentaNYL (SUBLIMAZE) injection, fentaNYL (SUBLIMAZE) injection, magnesium citrate, methocarbamol **OR** methocarbamol (ROBAXIN)  IV, metoCLOPramide **OR** metoCLOPramide (REGLAN) injection, ondansetron **OR** ondansetron (ZOFRAN) IV, polyethylene glycol, sodium chloride flush  Mickeal Skinner, MD Pg# [phone] [facility]

## 2017-06-08 ENCOUNTER — Inpatient Hospital Stay (HOSPITAL_COMMUNITY): Payer: Medicare Other

## 2017-06-08 LAB — BPAM PLATELET PHERESIS
Blood Product Expiration Date: 201810212359
ISSUE DATE / TIME: 201810201006
UNIT TYPE AND RH: 6200

## 2017-06-08 LAB — CBC
HEMATOCRIT: 34.4 % — AB (ref 36.0–46.0)
HEMOGLOBIN: 11.5 g/dL — AB (ref 12.0–15.0)
MCH: 30.3 pg (ref 26.0–34.0)
MCHC: 33.4 g/dL (ref 30.0–36.0)
MCV: 90.5 fL (ref 78.0–100.0)
Platelets: 41 10*3/uL — ABNORMAL LOW (ref 150–400)
RBC: 3.8 MIL/uL — ABNORMAL LOW (ref 3.87–5.11)
RDW: 16.6 % — ABNORMAL HIGH (ref 11.5–15.5)
WBC: 22.8 10*3/uL — ABNORMAL HIGH (ref 4.0–10.5)

## 2017-06-08 LAB — BPAM RBC
BLOOD PRODUCT EXPIRATION DATE: 201810312359
BLOOD PRODUCT EXPIRATION DATE: 201810312359
BLOOD PRODUCT EXPIRATION DATE: 201810312359
BLOOD PRODUCT EXPIRATION DATE: 201811042359
BLOOD PRODUCT EXPIRATION DATE: 201811052359
BLOOD PRODUCT EXPIRATION DATE: 201811052359
BLOOD PRODUCT EXPIRATION DATE: 201811152359
BLOOD PRODUCT EXPIRATION DATE: 201811182359
Blood Product Expiration Date: 201810222359
Blood Product Expiration Date: 201810312359
Blood Product Expiration Date: 201810312359
Blood Product Expiration Date: 201811052359
Blood Product Expiration Date: 201811052359
Blood Product Expiration Date: 201811142359
Blood Product Expiration Date: 201811152359
ISSUE DATE / TIME: 201810171151
ISSUE DATE / TIME: 201810171151
ISSUE DATE / TIME: 201810171318
ISSUE DATE / TIME: 201810171550
ISSUE DATE / TIME: 201810171550
ISSUE DATE / TIME: 201810172004
ISSUE DATE / TIME: 201810172004
ISSUE DATE / TIME: 201810172044
ISSUE DATE / TIME: 201810172044
ISSUE DATE / TIME: 201810180101
ISSUE DATE / TIME: 201810180101
ISSUE DATE / TIME: 201810191112
ISSUE DATE / TIME: 201810191315
UNIT TYPE AND RH: 1700
UNIT TYPE AND RH: 1700
UNIT TYPE AND RH: 5100
UNIT TYPE AND RH: 7300
UNIT TYPE AND RH: 7300
UNIT TYPE AND RH: 7300
UNIT TYPE AND RH: 7300
UNIT TYPE AND RH: 7300
Unit Type and Rh: 1700
Unit Type and Rh: 1700
Unit Type and Rh: 5100
Unit Type and Rh: 7300
Unit Type and Rh: 9500
Unit Type and Rh: 9500
Unit Type and Rh: 9500

## 2017-06-08 LAB — BASIC METABOLIC PANEL
ANION GAP: 9 (ref 5–15)
Anion gap: 6 (ref 5–15)
Anion gap: 7 (ref 5–15)
BUN: 52 mg/dL — ABNORMAL HIGH (ref 6–20)
BUN: 52 mg/dL — ABNORMAL HIGH (ref 6–20)
BUN: 55 mg/dL — ABNORMAL HIGH (ref 6–20)
CHLORIDE: 107 mmol/L (ref 101–111)
CHLORIDE: 107 mmol/L (ref 101–111)
CHLORIDE: 107 mmol/L (ref 101–111)
CO2: 26 mmol/L (ref 22–32)
CO2: 26 mmol/L (ref 22–32)
CO2: 25 mmol/L (ref 22–32)
CREATININE: 2.59 mg/dL — AB (ref 0.44–1.00)
CREATININE: 2.62 mg/dL — AB (ref 0.44–1.00)
CREATININE: 2.74 mg/dL — AB (ref 0.44–1.00)
Calcium: 7.6 mg/dL — ABNORMAL LOW (ref 8.9–10.3)
Calcium: 7.7 mg/dL — ABNORMAL LOW (ref 8.9–10.3)
Calcium: 7.8 mg/dL — ABNORMAL LOW (ref 8.9–10.3)
GFR calc non Af Amer: 16 mL/min — ABNORMAL LOW (ref >60)
GFR calc non Af Amer: 16 mL/min — ABNORMAL LOW (ref >60)
GFR calc non Af Amer: 15 mL/min — ABNORMAL LOW (ref >60)
GFR, EST AFRICAN AMERICAN: 19 mL/min — AB (ref >60)
GFR, EST AFRICAN AMERICAN: 19 mL/min — AB (ref >60)
GFR, EST AFRICAN AMERICAN: 18 mL/min — AB (ref >60)
GLUCOSE: 87 mg/dL (ref 65–99)
GLUCOSE: 90 mg/dL (ref 65–99)
Glucose, Bld: 86 mg/dL (ref 65–99)
Potassium: 3.8 mmol/L (ref 3.5–5.1)
Potassium: 3.8 mmol/L (ref 3.5–5.1)
Potassium: 4 mmol/L (ref 3.5–5.1)
Sodium: 139 mmol/L (ref 135–145)
Sodium: 140 mmol/L (ref 135–145)
Sodium: 141 mmol/L (ref 135–145)

## 2017-06-08 LAB — GLUCOSE, CAPILLARY
GLUCOSE-CAPILLARY: 93 mg/dL (ref 65–99)
GLUCOSE-CAPILLARY: 72 mg/dL (ref 65–99)
GLUCOSE-CAPILLARY: 79 mg/dL (ref 65–99)
Glucose-Capillary: 170 mg/dL — ABNORMAL HIGH (ref 65–99)
Glucose-Capillary: 58 mg/dL — ABNORMAL LOW (ref 65–99)
Glucose-Capillary: 61 mg/dL — ABNORMAL LOW (ref 65–99)
Glucose-Capillary: 64 mg/dL — ABNORMAL LOW (ref 65–99)
Glucose-Capillary: 70 mg/dL (ref 65–99)
Glucose-Capillary: 75 mg/dL (ref 65–99)
Glucose-Capillary: 85 mg/dL (ref 65–99)
Glucose-Capillary: 91 mg/dL (ref 65–99)

## 2017-06-08 LAB — TYPE AND SCREEN
ABO/RH(D): B NEG
Antibody Screen: NEGATIVE
UNIT DIVISION: 0
UNIT DIVISION: 0
UNIT DIVISION: 0
UNIT DIVISION: 0
UNIT DIVISION: 0
UNIT DIVISION: 0
UNIT DIVISION: 0
UNIT DIVISION: 0
UNIT DIVISION: 0
UNIT DIVISION: 0
Unit division: 0
Unit division: 0
Unit division: 0
Unit division: 0
Unit division: 0

## 2017-06-08 LAB — BLOOD GAS, ARTERIAL
ACID-BASE EXCESS: 0.7 mmol/L (ref 0.0–2.0)
BICARBONATE: 25.6 mmol/L (ref 20.0–28.0)
Drawn by: 10006
FIO2: 40
LHR: 16 {breaths}/min
O2 Saturation: 97 %
PEEP/CPAP: 5 cmH2O
PO2 ART: 92.1 mmHg (ref 83.0–108.0)
Patient temperature: 98.6
VT: 450 mL
pCO2 arterial: 47.7 mmHg (ref 32.0–48.0)
pH, Arterial: 7.349 — ABNORMAL LOW (ref 7.350–7.450)

## 2017-06-08 LAB — PREPARE FRESH FROZEN PLASMA
UNIT DIVISION: 0
Unit division: 0

## 2017-06-08 LAB — PROTIME-INR
INR: 1.45
PROTHROMBIN TIME: 17.5 s — AB (ref 11.4–15.2)

## 2017-06-08 LAB — PREPARE PLATELET PHERESIS: Unit division: 0

## 2017-06-08 LAB — BPAM FFP
BLOOD PRODUCT EXPIRATION DATE: 201810252359
Blood Product Expiration Date: 201810252359
ISSUE DATE / TIME: 201810201055
ISSUE DATE / TIME: 201810201055
UNIT TYPE AND RH: 7300
Unit Type and Rh: 7300

## 2017-06-08 LAB — MAGNESIUM: Magnesium: 1.2 mg/dL — ABNORMAL LOW (ref 1.7–2.4)

## 2017-06-08 LAB — PHOSPHORUS: Phosphorus: 4.2 mg/dL (ref 2.5–4.6)

## 2017-06-08 MED ORDER — DEXTROSE 5 % IV SOLN
160.0000 mg | Freq: Once | INTRAVENOUS | Status: AC
  Administered 2017-06-08: 160 mg via INTRAVENOUS
  Filled 2017-06-08: qty 16

## 2017-06-08 MED ORDER — SODIUM CHLORIDE 0.9 % IV SOLN
Freq: Once | INTRAVENOUS | Status: AC
  Administered 2017-06-08: 10:00:00 via INTRAVENOUS

## 2017-06-08 MED ORDER — MAGNESIUM SULFATE 2 GM/50ML IV SOLN
2.0000 g | Freq: Once | INTRAVENOUS | Status: AC
  Administered 2017-06-08: 2 g via INTRAVENOUS
  Filled 2017-06-08: qty 50

## 2017-06-08 MED ORDER — DEXTROSE 50 % IV SOLN
INTRAVENOUS | Status: AC
  Administered 2017-06-08: 25 mL
  Filled 2017-06-08: qty 50

## 2017-06-08 MED ORDER — ATROPINE SULFATE 1 MG/10ML IJ SOSY
PREFILLED_SYRINGE | INTRAMUSCULAR | Status: AC
  Filled 2017-06-08: qty 10

## 2017-06-08 NOTE — Progress Notes (Signed)
[facility]/Trauma Progress Note  4 Days Post-Op   Assessment/Plan Active Problems:   Acute on chronic diastolic CHF (congestive heart failure) (HCC)   Hypotension   Elevated troponin   Atherosclerosis of native arteries of extremities with gangrene, left leg (HCC)   Traumatic hematoma of left lower leg   Necrotizing fasciitis of lower leg (HCC)   Pressure injury of skin   Hemorrhagic shock (HCC)   Acute hypoxemic respiratory failure (HCC)   AKI (acute kidney injury) (HCC)   UGIB (upper gastrointestinal bleed)   64F hx of HFpEF, nec fasciitis of LLE, HLD, pHTN, CKD III, COPD on home O2 (2.5L) - was being managed by ortho for LLE wound and postop developed massive GIB - found to have DU that likely had eroded through GDA -   Bleeding duodenal ulcer and hemorrhagic shock - S/P Exploratory laparotomy with pyloroplasty and oversewing bleeding duodenal ulcer, Dr. Brantley Stage, 10/17 - Flush NG tube with saline, low NGT output  - Hgb 11.5 today, stable - PPI drip - If rebleed occurs, plan will be for CT angiogram abdomen/pelvis, with potential IR for angiographic embolization - high JP drain bloody output, monitor - still requiring levo - weaning off vent  FEN: NPO, NGT VTE: SCD's, no chemical prophylaxis in setting of GIB ID: Zosyn 10/12>>   Zyvox 10/15>> - Surgical wound culture 10/10 >> E coli, Pseudomonas, Morganella morganii, proteus, VRE Foley: yes, oliguric, AKI in setting of CKD Follow up: TBD  DISPO: -Will continue to monitor closely, ortho's plan for wound vac LLE???     LOS: 20 days    Subjective:  CC: GIB  Husband at bedside. Pt states no abdominal pain. On vent. Nurse states small BM smear yesterday. No flatus per pt. NGT output 100 overnight. JP drain with sanguinous drainage and 1250CC in last 24hrs.   Objective: Vital signs in last 24 hours: Temp:  [98.3 F (36.8 C)-98.9 F (37.2 C)] 98.8 F (37.1 C) (10/21 0836) Pulse Rate:  [49-81] 61  (10/21 0738) Resp:  [17-37] 21 (10/21 0738) BP: (76-119)/(43-78) 76/62 (10/21 0738) SpO2:  [91 %-99 %] 96 % (10/21 0834) Arterial Line BP: (66-105)/(57-84) 71/64 (10/21 0600) FiO2 (%):  [40 %] 40 % (10/21 0738) Weight:  [285 lb (129.3 kg)] 285 lb (129.3 kg) (10/21 0500) Last BM Date: 06/04/2017  Intake/Output from previous day: 10/20 0701 - 10/21 0700 In: 2484.4 [I.V.:1280.4; Blood:794; IV Piggyback:400] Out: 2130 [Urine:10; Drains:1450] Intake/Output this shift: No intake/output data recorded.  PE: Gen:  Alert, NAD, pleasant, cooperative, on vent Card:  irregularly irregular, rate normal, no murmurs noted  Pulm:  On vent, course breath sounds Abd: Soft, NT/ND, no BS appreciated, midline incision with good base of granulation tissue without purulent drainage or surrounding erythema noted, drain with high output sanguinous drainage, drain site C/D/I Skin: no rashes noted, warm and dry Extremities: 3+ pitting edema   Anti-infectives: Anti-infectives    Start     Dose/Rate Route Frequency Ordered Stop   06/03/2017 0930  ceFAZolin (ANCEF) IVPB 2g/100 mL premix     2 g 200 mL/hr over 30 Minutes Intravenous On call to O.R. 06/03/17 1214 05/31/2017 1006   06/02/17 2200  linezolid (ZYVOX) IVPB 600 mg    Comments:  Pharmacy may adjust dose prn   600 mg 300 mL/hr over 60 Minutes Intravenous Every 12 hours 06/02/17 1546     06/02/17 1545  linezolid (ZYVOX) IVPB 600 mg  Status:  Discontinued     600 mg 300  mL/hr over 60 Minutes Intravenous Every 12 hours 06/02/17 1543 06/02/17 1546   05/31/17 1500  vancomycin (VANCOCIN) 1,250 mg in sodium chloride 0.9 % 250 mL IVPB  Status:  Discontinued     1,250 mg 166.7 mL/hr over 90 Minutes Intravenous Every 24 hours 06/18/2017 1409 06/03/17 0845   06/05/2017 2000  piperacillin-tazobactam (ZOSYN) IVPB 3.375 g     3.375 g 12.5 mL/hr over 240 Minutes Intravenous Every 8 hours 05/20/2017 1409     05/29/2017 1300  vancomycin (VANCOCIN) 2,000 mg in sodium chloride  0.9 % 500 mL IVPB     2,000 mg 250 mL/hr over 120 Minutes Intravenous  Once 05/29/2017 1208 05/31/2017 1657   05/27/2017 1300  piperacillin-tazobactam (ZOSYN) IVPB 3.375 g     3.375 g 100 mL/hr over 30 Minutes Intravenous  Once 05/19/2017 1208 05/24/2017 1334   05/21/2017 0745  ceFAZolin (ANCEF) IVPB 2g/100 mL premix  Status:  Discontinued     2 g 200 mL/hr over 30 Minutes Intravenous On call to O.R. 05/22/2017 0736 06/11/2017 1143   06/16/2017 1800  ceFAZolin (ANCEF) IVPB 2g/100 mL premix  Status:  Discontinued     2 g 200 mL/hr over 30 Minutes Intravenous Every 8 hours 06/12/2017 1220 05/29/2017 1210   06/18/2017 0945  ceFAZolin (ANCEF) IVPB 2g/100 mL premix     2 g 200 mL/hr over 30 Minutes Intravenous To Surgery 05/27/17 2001 06/07/2017 1015   05/24/2017 2000  ceFAZolin (ANCEF) IVPB 2g/100 mL premix     2 g 200 mL/hr over 30 Minutes Intravenous Every 6 hours 06/07/2017 1643 05/24/17 1009   06/16/2017 1015  ceFAZolin (ANCEF) IVPB 2g/100 mL premix     2 g 200 mL/hr over 30 Minutes Intravenous On call to O.R. 06/02/2017 1014 05/22/2017 1400      Lab Results:   Recent Labs  06/07/17 1632 06/08/17 0200  WBC 21.0* 22.8*  HGB 11.2* 11.5*  HCT 33.5* 34.4*  PLT 49* 41*   BMET  Recent Labs  06/07/17 1632 06/07/17 1920  NA 142 138  K 3.9 3.8  CL 103 103  CO2 27 26  GLUCOSE 88 92  BUN 44* 48*  CREATININE 2.31* 2.35*  CALCIUM 7.8* 7.7*   PT/INR  Recent Labs  06/08/2017 0719 06/07/17 0500  LABPROT 18.1* 18.8*  INR 1.51 1.59   CMP     Component Value Date/Time   NA 138 06/07/2017 1920   K 3.8 06/07/2017 1920   CL 103 06/07/2017 1920   CO2 26 06/07/2017 1920   GLUCOSE 92 06/07/2017 1920   BUN 48 (H) 06/07/2017 1920   CREATININE 2.35 (H) 06/07/2017 1920   CREATININE 1.10 (H) 01/31/2016 1118   CALCIUM 7.7 (L) 06/07/2017 1920   PROT 3.3 (L) 06/05/2017 0413   ALBUMIN 1.6 (L) 06/05/2017 0413   AST 2,448 (H) 06/05/2017 0413   ALT 389 (H) 06/05/2017 0413   ALKPHOS 92 06/05/2017 0413   BILITOT 3.5  (H) 06/05/2017 0413   GFRNONAA 18 (L) 06/07/2017 1920   GFRAA 21 (L) 06/07/2017 1920   Lipase  No results found for: LIPASE  Studies/Results: Dg Chest Port 1 View  Result Date: 06/08/2017 CLINICAL DATA:  Acute respiratory failure. EXAM: PORTABLE CHEST 1 VIEW COMPARISON:  06/07/2017. FINDINGS: Suboptimal rotated film. Increased cardiomediastinal silhouette. Support tubes and lines are unchanged. BILATERAL pulmonary opacities appear worse consistent with increasing pulmonary edema. IMPRESSION: BILATERAL pulmonary opacities appear worse consistent with increasing pulmonary edema. Increased cardiomediastinal silhouette. Electronically Signed   By: Madie Reno  Curnes M.D.   On: 06/08/2017 07:15   Dg Chest Port 1 View  Result Date: 06/07/2017 CLINICAL DATA:  Endotracheal tube placement EXAM: PORTABLE CHEST 1 VIEW COMPARISON:  06/09/2017 FINDINGS: Endotracheal tube, nasogastric catheter and right jugular central line are again seen and stable. Cardiac enlargement is again seen. Vascular congestion is noted stable from the prior exam. Persistent effusions and bibasilar atelectatic changes are noted. The overall appearance is stable from the prior study. IMPRESSION: Changes consistent with CHF stable from the prior exam. Electronically Signed   By: Inez Catalina M.D.   On: 06/07/2017 06:59      Kalman Drape , [facility] 06/08/2017, 8:43 AM Pager: [phone] Consults: [phone] Mon-Fri 7:00 am-4:30 pm Sat-Sun 7:00 am-11:30 am

## 2017-06-08 NOTE — Progress Notes (Signed)
Flow track d/c and a-line removed due to clot in catheter. Dr Elsworth Soho made aware. No need for re-insertion, BP cuff sufficient. RN aware. RT will continue to monitor.

## 2017-06-08 NOTE — Progress Notes (Addendum)
82 year old woman with COPD on home oxygen, pulmonary hypertension initially admitted 9/22 with traumatic left leg hematoma .she was discharged to SNF after 7 days and then readmitted 10/1 with CHF exacerbation.She required wound debridement several times during this admit , last on 10/17. Postop developed upper GI bleed with hemorrhagic shock, required emergent laparotomy for duodenal patch and oversewing of ulcer.  Remains on low-dose Levophed and  Anuric , no response to 80 lasix 10/20  On exam-awake, follows commands, appears acutely ill and deconditioned, anasarca, dark blood draining from the JP drain , minimal from NG tube, S1-S2 normal, no JVD.  Chest x-ray again shows hardware in position and worse bilateral effusions/ infx, reviewed by me.  Labs show thrombocytopenia, leukocytosis, hemoglobin stable , low Mg  Impression/plan  Hemorrhagic shock- resolving, difficult to assess volume status in this woman with pulmonary hypertension, CVP was low earlier and has increased , ct levophed Continued dark bleeding from JP drains is concerning,but stable hemoglobin is reassuring surgery following Keep hemoglobin 10 and above, transfuse another unit platelets today for thrombocytopenia  AKI - is the main issue now, no acute indication for dialysis, may need this especially if she develops hyperkalemia or acidosis, cr plateauing , trial of 160 lasix Hypomagnesemia -Replete   Acute respiratory failure- ct SBTs but defer extubation until renal failure resolves & she starts mobilising fluid  Wound -had VRE & GNR, may have to stop linezolid due to low plts , ct zosyn  Family updated at bedside. Coordinated with consultanats  The patient is critically ill with multiple organ systems failure and requires high complexity decision making for assessment and support, frequent evaluation and titration of therapies, application of advanced monitoring technologies and extensive interpretation of  multiple databases. Critical Care Time devoted to patient care services described in this note independent of APP time is 35 minutes.    Rigoberto Noel MD

## 2017-06-09 ENCOUNTER — Encounter (HOSPITAL_COMMUNITY): Payer: Self-pay | Admitting: Anesthesiology

## 2017-06-09 ENCOUNTER — Inpatient Hospital Stay (HOSPITAL_COMMUNITY): Payer: Medicare Other

## 2017-06-09 DIAGNOSIS — I9589 Other hypotension: Secondary | ICD-10-CM

## 2017-06-09 DIAGNOSIS — D649 Anemia, unspecified: Secondary | ICD-10-CM

## 2017-06-09 LAB — COMPREHENSIVE METABOLIC PANEL
ALBUMIN: 1.4 g/dL — AB (ref 3.5–5.0)
ALK PHOS: 130 U/L — AB (ref 38–126)
ALT: 19 U/L (ref 14–54)
ANION GAP: 7 (ref 5–15)
AST: 121 U/L — ABNORMAL HIGH (ref 15–41)
BUN: 56 mg/dL — ABNORMAL HIGH (ref 6–20)
CALCIUM: 7.7 mg/dL — AB (ref 8.9–10.3)
CO2: 25 mmol/L (ref 22–32)
Chloride: 109 mmol/L (ref 101–111)
Creatinine, Ser: 2.94 mg/dL — ABNORMAL HIGH (ref 0.44–1.00)
GFR calc non Af Amer: 14 mL/min — ABNORMAL LOW (ref >60)
GFR, EST AFRICAN AMERICAN: 16 mL/min — AB (ref >60)
GLUCOSE: 77 mg/dL (ref 65–99)
POTASSIUM: 3.9 mmol/L (ref 3.5–5.1)
SODIUM: 141 mmol/L (ref 135–145)
Total Bilirubin: 1.6 mg/dL — ABNORMAL HIGH (ref 0.3–1.2)
Total Protein: 4.2 g/dL — ABNORMAL LOW (ref 6.5–8.1)

## 2017-06-09 LAB — CBC
HEMATOCRIT: 35.1 % — AB (ref 36.0–46.0)
HEMOGLOBIN: 11.3 g/dL — AB (ref 12.0–15.0)
MCH: 29.7 pg (ref 26.0–34.0)
MCHC: 32.2 g/dL (ref 30.0–36.0)
MCV: 92.1 fL (ref 78.0–100.0)
Platelets: 48 10*3/uL — ABNORMAL LOW (ref 150–400)
RBC: 3.81 MIL/uL — ABNORMAL LOW (ref 3.87–5.11)
RDW: 16.5 % — ABNORMAL HIGH (ref 11.5–15.5)
WBC: 19.9 10*3/uL — ABNORMAL HIGH (ref 4.0–10.5)

## 2017-06-09 LAB — BASIC METABOLIC PANEL
Anion gap: 10 (ref 5–15)
Anion gap: 11 (ref 5–15)
BUN: 59 mg/dL — ABNORMAL HIGH (ref 6–20)
BUN: 61 mg/dL — ABNORMAL HIGH (ref 6–20)
CHLORIDE: 107 mmol/L (ref 101–111)
CHLORIDE: 108 mmol/L (ref 101–111)
CO2: 24 mmol/L (ref 22–32)
CO2: 23 mmol/L (ref 22–32)
CREATININE: 3.14 mg/dL — AB (ref 0.44–1.00)
Calcium: 8.1 mg/dL — ABNORMAL LOW (ref 8.9–10.3)
Calcium: 8 mg/dL — ABNORMAL LOW (ref 8.9–10.3)
Creatinine, Ser: 3.03 mg/dL — ABNORMAL HIGH (ref 0.44–1.00)
GFR calc Af Amer: 16 mL/min — ABNORMAL LOW (ref >60)
GFR calc non Af Amer: 14 mL/min — ABNORMAL LOW (ref >60)
GFR, EST AFRICAN AMERICAN: 15 mL/min — AB (ref >60)
GFR, EST NON AFRICAN AMERICAN: 13 mL/min — AB (ref >60)
Glucose, Bld: 84 mg/dL (ref 65–99)
Glucose, Bld: 93 mg/dL (ref 65–99)
POTASSIUM: 3.9 mmol/L (ref 3.5–5.1)
Potassium: 3.9 mmol/L (ref 3.5–5.1)
SODIUM: 142 mmol/L (ref 135–145)
Sodium: 141 mmol/L (ref 135–145)

## 2017-06-09 LAB — BPAM PLATELET PHERESIS
Blood Product Expiration Date: 201810222359
ISSUE DATE / TIME: 201810210947
Unit Type and Rh: 6200

## 2017-06-09 LAB — GLUCOSE, CAPILLARY
GLUCOSE-CAPILLARY: 70 mg/dL (ref 65–99)
GLUCOSE-CAPILLARY: 73 mg/dL (ref 65–99)
GLUCOSE-CAPILLARY: 74 mg/dL (ref 65–99)
GLUCOSE-CAPILLARY: 81 mg/dL (ref 65–99)
Glucose-Capillary: 70 mg/dL (ref 65–99)
Glucose-Capillary: 65 mg/dL (ref 65–99)
Glucose-Capillary: 123 mg/dL — ABNORMAL HIGH (ref 65–99)

## 2017-06-09 LAB — PROTIME-INR
INR: 1.47
PROTHROMBIN TIME: 17.7 s — AB (ref 11.4–15.2)

## 2017-06-09 LAB — MAGNESIUM: MAGNESIUM: 1.5 mg/dL — AB (ref 1.7–2.4)

## 2017-06-09 LAB — PREPARE PLATELET PHERESIS: Unit division: 0

## 2017-06-09 LAB — H. PYLORI ANTIBODY, IGG: H Pylori IgG: <0.8 Index Value (ref 0.00–0.79)

## 2017-06-09 LAB — CORTISOL: Cortisol, Plasma: 40.9 ug/dL

## 2017-06-09 LAB — PHOSPHORUS: PHOSPHORUS: 4.8 mg/dL — AB (ref 2.5–4.6)

## 2017-06-09 MED ORDER — DAPTOMYCIN 500 MG IV SOLR
750.0000 mg | INTRAVENOUS | Status: DC
  Administered 2017-06-09: 750 mg via INTRAVENOUS
  Filled 2017-06-09: qty 15

## 2017-06-09 MED ORDER — MAGNESIUM SULFATE 2 GM/50ML IV SOLN
2.0000 g | Freq: Once | INTRAVENOUS | Status: AC
Start: 2017-06-09 — End: 2017-06-09
  Administered 2017-06-09: 2 g via INTRAVENOUS
  Filled 2017-06-09: qty 50

## 2017-06-09 MED ORDER — FUROSEMIDE 10 MG/ML IJ SOLN
160.0000 mg | Freq: Four times a day (QID) | INTRAVENOUS | Status: AC
  Administered 2017-06-09 (×2): 160 mg via INTRAVENOUS
  Filled 2017-06-09 (×2): qty 16

## 2017-06-09 MED ORDER — DEXTROSE 5 % IV SOLN
160.0000 mg | Freq: Four times a day (QID) | INTRAVENOUS | Status: DC
  Filled 2017-06-09 (×2): qty 16

## 2017-06-09 MED ORDER — DEXTROSE-NACL 5-0.9 % IV SOLN
INTRAVENOUS | Status: DC
  Administered 2017-06-09: 18:00:00 via INTRAVENOUS

## 2017-06-09 MED ORDER — TRACE MINERALS CR-CU-MN-SE-ZN 10-1000-500-60 MCG/ML IV SOLN
INTRAVENOUS | Status: AC
  Administered 2017-06-09: 18:00:00 via INTRAVENOUS
  Filled 2017-06-09: qty 720

## 2017-06-09 MED ORDER — DEXTROSE 50 % IV SOLN
25.0000 mL | Freq: Once | INTRAVENOUS | Status: AC
  Administered 2017-06-09: 25 mL via INTRAVENOUS
  Filled 2017-06-09: qty 50

## 2017-06-09 MED ORDER — INSULIN ASPART 100 UNIT/ML ~~LOC~~ SOLN
0.0000 [IU] | SUBCUTANEOUS | Status: DC
  Administered 2017-06-10: 3 [IU] via SUBCUTANEOUS
  Administered 2017-06-10: 1 [IU] via SUBCUTANEOUS
  Administered 2017-06-11: 3 [IU] via SUBCUTANEOUS

## 2017-06-09 MED ORDER — DEXTROSE 50 % IV SOLN
INTRAVENOUS | Status: AC
  Filled 2017-06-09: qty 50

## 2017-06-09 MED ORDER — PIPERACILLIN-TAZOBACTAM 3.375 G IVPB
3.3750 g | Freq: Three times a day (TID) | INTRAVENOUS | Status: DC
  Filled 2017-06-09: qty 50

## 2017-06-09 MED ORDER — PIPERACILLIN-TAZOBACTAM IN DEX 2-0.25 GM/50ML IV SOLN
2.2500 g | Freq: Three times a day (TID) | INTRAVENOUS | Status: DC
  Administered 2017-06-09 – 2017-06-10 (×2): 2.25 g via INTRAVENOUS
  Filled 2017-06-09 (×3): qty 50

## 2017-06-09 MED ORDER — PIPERACILLIN-TAZOBACTAM IN DEX 2-0.25 GM/50ML IV SOLN
2.2500 g | Freq: Three times a day (TID) | INTRAVENOUS | Status: DC
  Filled 2017-06-09: qty 50

## 2017-06-09 NOTE — Progress Notes (Signed)
Patient ID: [name], female   DOB: December 03, 1934, 82 y.o.   MRN: [id number] Patient is seen in follow-up status post debridement necrotizing fasciitis left lower extremity.  The wound ostomy and continence nursing has changed the dressing, pictures were obtained, these were reviewed which shows massive necrosis of the left lower extremity.  Patient will need revision to an above-the-knee amputation secondary to the massive wound necrosis from the pressor medication.  Discussed with the patient and her family that we would proceed with surgery as soon as she is safe and stable from a medical and general surgery standpoint.  Please call me at [phone] when patient is stable for above-the-knee amputation surgery.

## 2017-06-09 NOTE — Progress Notes (Signed)
5 Days Post-Op   Subjective/Chief Complaint: ON VENT BUT AWAKE  Pressors weaning    Objective: Vital signs in last 24 hours: Temp:  [98.4 F (36.9 C)-98.8 F (37.1 C)] 98.4 F (36.9 C) (10/22 0808) Pulse Rate:  [45-117] 58 (10/22 0758) Resp:  [13-26] 22 (10/22 0758) BP: (79-129)/(41-115) 108/58 (10/22 0758) SpO2:  [88 %-100 %] 97 % (10/22 0745) Arterial Line BP: (75-149)/(64-121) 149/121 (10/21 0930) FiO2 (%):  [40 %] 40 % (10/22 0758) Weight:  [129.3 kg (285 lb)] 129.3 kg (285 lb) (10/22 0500) Last BM Date: 06/05/2017  Intake/Output from previous day: 10/21 0701 - 10/22 0700 In: 2554 [I.V.:2031; Blood:273; IV Piggyback:250] Out: 1525 [Emesis/NG output:450; Drains:1075] Intake/Output this shift: Total I/O In: 82.3 [I.V.:82.3] Out: -   Incision/Wound:wound open   Dark drainage from JP  Bloody drainage from NGT   Lab Results:   Recent Labs  06/08/17 0200 06/09/17 0500  WBC 22.8* 19.9*  HGB 11.5* 11.3*  HCT 34.4* 35.1*  PLT 41* 48*   BMET  Recent Labs  06/08/17 1745 06/09/17 0500  NA 141 141  K 4.0 3.9  CL 107 109  CO2 25 25  GLUCOSE 86 77  BUN 55* 56*  CREATININE 2.74* 2.94*  CALCIUM 7.8* 7.7*   PT/INR  Recent Labs  06/08/17 0830 06/09/17 0500  LABPROT 17.5* 17.7*  INR 1.45 1.47   ABG  Recent Labs  06/07/17 0845 06/08/17 0450  PHART 7.405 7.349*  HCO3 26.4 25.6    Studies/Results: Dg Chest Port 1 View  Result Date: 06/09/2017 CLINICAL DATA:  82 year old female with acute respiratory failure. EXAM: PORTABLE CHEST 1 VIEW COMPARISON:  06/08/2017. FINDINGS: Endotracheal tube tip 3 cm above the carina with slight angulation of the trachea to the right unchanged. Right central line tip proximal superior vena cava level. Nasogastric tube courses below the diaphragm. Tip is not included on the present exam. Cardiomegaly. Pulmonary vascular congestion/ mild pulmonary edema with bilateral pleural effusions suspected. Bibasilar atelectasis.   Basilar infiltrates secondary consideration. No pneumothorax. IMPRESSION: Overall no significant change. Pulmonary edema with bilateral pleural effusions. Cardiomegaly. Bibasilar atelectasis suspected. Basilar infiltrates secondary consideration. Electronically Signed   By: Genia Del M.D.   On: 06/09/2017 07:13   Dg Chest Port 1 View  Result Date: 06/08/2017 CLINICAL DATA:  Acute respiratory failure. EXAM: PORTABLE CHEST 1 VIEW COMPARISON:  06/07/2017. FINDINGS: Suboptimal rotated film. Increased cardiomediastinal silhouette. Support tubes and lines are unchanged. BILATERAL pulmonary opacities appear worse consistent with increasing pulmonary edema. IMPRESSION: BILATERAL pulmonary opacities appear worse consistent with increasing pulmonary edema. Increased cardiomediastinal silhouette. Electronically Signed   By: Staci Righter M.D.   On: 06/08/2017 07:15    Anti-infectives: Anti-infectives    Start     Dose/Rate Route Frequency Ordered Stop   05/21/2017 0930  ceFAZolin (ANCEF) IVPB 2g/100 mL premix     2 g 200 mL/hr over 30 Minutes Intravenous On call to O.R. 06/03/17 1214 05/25/2017 1006   06/02/17 2200  linezolid (ZYVOX) IVPB 600 mg  Status:  Discontinued    Comments:  Pharmacy may adjust dose prn   600 mg 300 mL/hr over 60 Minutes Intravenous Every 12 hours 06/02/17 1546 06/08/17 0915   06/02/17 1545  linezolid (ZYVOX) IVPB 600 mg  Status:  Discontinued     600 mg 300 mL/hr over 60 Minutes Intravenous Every 12 hours 06/02/17 1543 06/02/17 1546   05/31/17 1500  vancomycin (VANCOCIN) 1,250 mg in sodium chloride 0.9 % 250 mL IVPB  Status:  Discontinued     1,250 mg 166.7 mL/hr over 90 Minutes Intravenous Every 24 hours 06/04/2017 1409 06/03/17 0845   05/29/2017 2000  piperacillin-tazobactam (ZOSYN) IVPB 3.375 g     3.375 g 12.5 mL/hr over 240 Minutes Intravenous Every 8 hours 05/25/2017 1409     05/19/2017 1300  vancomycin (VANCOCIN) 2,000 mg in sodium chloride 0.9 % 500 mL IVPB     2,000 mg 250  mL/hr over 120 Minutes Intravenous  Once 06/18/2017 1208 05/29/2017 1657   06/13/2017 1300  piperacillin-tazobactam (ZOSYN) IVPB 3.375 g     3.375 g 100 mL/hr over 30 Minutes Intravenous  Once 06/11/2017 1208 06/03/2017 1334   06/03/2017 0745  ceFAZolin (ANCEF) IVPB 2g/100 mL premix  Status:  Discontinued     2 g 200 mL/hr over 30 Minutes Intravenous On call to O.R. 06/16/2017 0736 05/25/2017 1143   05/24/2017 1800  ceFAZolin (ANCEF) IVPB 2g/100 mL premix  Status:  Discontinued     2 g 200 mL/hr over 30 Minutes Intravenous Every 8 hours 06/16/2017 1220 05/21/2017 1210   06/05/2017 0945  ceFAZolin (ANCEF) IVPB 2g/100 mL premix     2 g 200 mL/hr over 30 Minutes Intravenous To Surgery 05/27/17 2001 06/14/2017 1015   05/24/2017 2000  ceFAZolin (ANCEF) IVPB 2g/100 mL premix     2 g 200 mL/hr over 30 Minutes Intravenous Every 6 hours 06/03/2017 1643 05/24/17 1009   06/16/2017 1015  ceFAZolin (ANCEF) IVPB 2g/100 mL premix     2 g 200 mL/hr over 30 Minutes Intravenous On call to O.R. 05/20/2017 1014 05/27/2017 1400      Assessment/Plan: s/p Procedure(s): EXPLORATORY LAPAROTOMY REPAIR OF DUODENAL ULCER (N/A) NEEDS UGI once stable  This can be done through NGT Needs TNA  Family asked to see ortho surgeon about legs   LOS: 21 days    Blanchie Zeleznik A. 06/09/2017

## 2017-06-09 NOTE — Clinical Social Work Note (Signed)
Clinical Social Work Assessment  Patient Details  Name: [name] MRN: [id number] Date of Birth: [birth year]  Date of referral:  06/09/17               Reason for consult:  Facility Placement, Discharge Planning                Permission sought to share information with:  Family Supports, Customer service manager Permission granted to share information::  Yes, Verbal Permission Granted  Name::     Tequila Rottmann  Agency::  SNFs  Relationship::  spouse  Contact Information:  [phone]  Housing/Transportation Living arrangements for the past 2 months:  [place], [place] of Information:  Patient, Facility, Spouse Patient Interpreter Needed:  None Criminal Activity/Legal Involvement Pertinent to Current Situation/Hospitalization:  No - Comment as needed Significant Relationships:  Spouse, Adult Children Lives with:  Spouse Do you feel safe going back to the place where you live?  Yes Need for family participation in patient care:  Yes (Comment)  Care giving concerns:  CSW spoke with pt's granddaughter Daleen Snook outside of the room. During this time pt's granddaughter expressed no concerns.    Social Worker assessment / plan:  Pt is currently intubated. At this time CSW spoke with pt's granddaughter Daleen Snook outside of the room. CSW was informed that pt is form home with pt's husband. Pt has support from children. Grandchildren and spouse. At this time pt's family is agreeable to SNF placement if its still needed after discharge.   Employment status:  Retired Forensic scientist:  Medicare PT Recommendations:  [facility] / Referral to community resources:  [place]  Patient/Family's Response to care:  Pt's granddaughter is understanding and agreeable to plan of care at this time.   Patient/Family's Understanding of and Emotional Response to Diagnosis, Current Treatment, and Prognosis:  No  further questions or concerns have been presented to CSW at this time.  Emotional Assessment Appearance:  Appears stated age Attitude/Demeanor/Rapport:  Unable to Assess (appropriate) Affect (typically observed):  Unable to Assess (pt is intuabted at this time. ) Orientation:   (pt is currently intuabted. ) Alcohol / Substance use:  Not Applicable Psych involvement (Current and /or in the community):  No (Comment)  Discharge Needs  Concerns to be addressed:  Discharge Planning Concerns Readmission within the last 30 days:  No Current discharge risk:  None Barriers to Discharge:  No Barriers Identified   Wetzel Bjornstad, [place] 06/09/2017, 9:25 AM

## 2017-06-09 NOTE — Progress Notes (Signed)
PHARMACY - ADULT TOTAL PARENTERAL NUTRITION CONSULT NOTE   Pharmacy Consult:  TPN Indication:  Prolonged NPO status  Patient Measurements: Height: 5' 2.99" (160 cm) Weight: 285 lb (129.3 kg) IBW/kg (Calculated) : 52.38 TPN AdjBW (KG): 64 Body mass index is 50.5 kg/m.  Assessment:  43 YOF presented on 05/29/2017 with CHF exacerbation, hypotension and elevated troponins.  PMH significant for AFib, dHF, CKD3, GERD, HLD, CAD, and pulmonary HTN.  Patient has gangrenous left thigh/leg and underwent multiple I&Ds.  She required transfer to the ICU after the I&D on 06/15/2017 d/t hypotension from duodenal ulcer bleeding.  Patient underwent ex-lap with pyloroplasty and oversewing of the bleeding ulcer on the same day.  Per charting, patient was on a carb modified diet through 06/03/17.  Today is day #6 of NPO with anticipated prolonged NPO status; therefore, Pharmacy consulted to initiate TPN for nutritional support.  GI: GIB on Protonix gtt D#5/6, NG O/P 114m, drains O/P 10755mEndo: no hx DM - CBGs low-low normal prior to TPN Insulin requirements in the past 24 hours: N/A Lytes: Phos 4.8 (Ca x Phos = 47, goal < 55), Mag 1.5 (2gm ordered), others WNL Renal: CKD3 - SCr up 2.94 (BL SCr 0.8-1.1), BUN 56 - no UOP, D5NS at 50 ml/hr Pulm: intubated 10/17, FiO2 100% Cards: s/p code blue, MAP 50s, CVP 6, in Afib - Levophed gtt, start Lasix IV Q6H AC: Eliquis for Afib held since 9/23 d/t LLE hematoma - mild anemia, plts 48K Ortho: gangrenous left thigh/leg s/p I&D 10/5, 10/10, 10/12, 10/17 Hepatobil: LFTs improving overall - AST/alk phos/tbili elevated (no jaundice) Neuro: PRN Fentanyl - GCS 14, RASS -1 (goal 0) ID: Zosyn for infected leg wound - afebrile, WBC 19.9 Best Practices: CHG TPN Access: CVC placed 06/05/17 TPN start date: 06/09/17  Nutritional Goals (per RD rec on 10/19): 138309-4076Cal and 130gm protein per day  Current Nutrition:  NPO   Plan:  Initiate Clinimix 5/15 (no lytes d/t  AKI) at 30 ml/hr (goal rate ~100 ml/hr) Hold 20% ILE for the first 7 days of TPN in ICU patients per ASPEN/SCCM guidelines (start date 06/16/17) Daily multivitamin and trace elements in TPN  Reduce D5NS to 20 ml/hr when TPN starts Continue sensitive SSI Q4H Standard TPN labs and nursing care orders   Shooter Tangen D. DaMina MarblePharmD, BCPS Pager:  [phone]/22/2018, 12:59 PM

## 2017-06-09 NOTE — Progress Notes (Addendum)
Addendum: SCr further worsening- 3.03, CrCl 15-20 mL/min. Will adjust Zosyn to 2.25 g IV every 8 hours.  Will monitor renal function closely for further adjustments.  Sloan Leiter, PharmD, BCPS Clinical Pharmacist Clinical phone 06/09/2017 until 3:30PM [phone] After hours, please call [phone] 06/09/2017, 4:35 PM   Pharmacy Antibiotic Note  [name] is a 82 y.o. female with a PMH of CKD III and a recent hospitalization for a traumatic left leg hematoma was readmitted on 06/05/2017 with a necrotizing LLE wound infection.  Had several I&Ds on October 5, 10, 12, 15, and 17 and then became hemodynamically unstable on 10/17 due to a large cratered ulcer in the duodenal bulb which required a massive blood transfusion after bursting during an endoscopy. Is s/p exploratory laperotomy. Wound cx abundantly grew several species, most notably pseudomonas and VRE. Was being maintained on zyvox and zosyn, but due to worsening thrombocytopenia, zyvox was discontinued on 10/21. Pharmacy has been consulted for daptomycin and zosyn dosing. Daptomycin approved by ID (Dr. Baxter Flattery).  Today, patients platelets are mildly improving. WBC are mildly decreasing and she remains afebrile. SCr trending up to 2.94 and she has not made any urine since 10/18. Per RN, wound appears very necrosed. She remains intubated, on levophed, and is to be started on TNA. To cover VRE, daptomycin to be started.    Plan: Zosyn 3.375 mg IV q8h  Daptomycin 750 mg IV q48h CK level w/ morning labs and weekly thereafter Follow renal function, clinical course  Height: 5' 2.99" (160 cm) Weight: 285 lb 0.9 oz (129.3 kg) IBW/kg (Calculated) : 52.38  Temp (24hrs), Avg:98.5 F (36.9 C), Min:98.2 F (36.8 C), Max:98.8 F (37.1 C)   Recent Labs Lab 06/05/17 1154  06/05/17 2252  06/02/2017 2326  06/07/17 0800 06/07/17 1632 06/07/17 1920 06/08/17 0200 06/08/17 0550 06/08/17 1055 06/08/17 1745 06/09/17 0500  WBC  --   < >  21.3*  < > 21.2*  --  19.9* 21.0*  --  22.8*  --   --   --  19.9*  CREATININE  --   < >  --   < >  --   < >  --  2.31* 2.35*  --  2.59* 2.62* 2.74* 2.94*  LATICACIDVEN 6.2*  --  4.2*  --   --   --   --   --   --   --   --   --   --   --   < > = values in this interval not displayed.  Estimated Creatinine Clearance: 20 mL/min (A) (by C-G formula based on SCr of 2.94 mg/dL (H)).    No Known Allergies  Antimicrobials this admission: Vanc 10/12 >>  Linezolid 10/15 >> 10/21 Zosyn 10/12 >> Daptomycin 10/22 >>  Dose adjustments this admission:   Microbiology results: Wound Cx: E coli, Pseudomonas, Morganella morganii, Proteus mirabilis, VRE (sensitive to linezolid)  10/2 MRSA PCR: neg  Thank you for allowing pharmacy to be a part of this patient's care.  Sallyanne Havers, PharmD Candidate 06/09/2017 12:52 PM  I discussed / reviewed the pharmacy note by Ms. Dunn, PharmD Candidate and I agree with the student's findings and plans as documented.   Sloan Leiter, PharmD, BCPS Clinical Pharmacist Clinical phone 06/09/2017 until 3:[phone] After hours, please call [phone] 06/09/2017, 3:03 PM

## 2017-06-09 NOTE — Progress Notes (Signed)
ET tube had slipped to 26 cm order to pull back 3 cm.  Pulled back and resecured at 23 cm.  Tolerated well.

## 2017-06-09 NOTE — Progress Notes (Signed)
PULMONARY / CRITICAL CARE MEDICINE   Name: [name] MRN: [id number] DOB: [birth year]    ADMISSION DATE:  06/05/2017 CONSULTATION DATE:  05/24/2017  REFERRING MD:  Dr. Sherral Hammers  CHIEF COMPLAINT:  Acute hypotension  HISTORY OF PRESENT ILLNESS:   82 year old female with history of HFpEF, necrotizing fasciitis of left lower leg, HLD, tricuspid regurg, pulmonary hypertension, CKD III, COPD on 2.5L at night presents with hypotension requiring pressor support after repeat left leg wound debridement earlier today (10/17). Patient was in her usual state of health until 9/22 when she presented for traumatic left leg hematoma and hypovolemic shock. She was admitted to the hospital 05/10/17-05/16/2017 and subsequently discharged to SNF where she experienced worsening weakness. She was then readmitted with acute on chronic HFpEF exacerbation on 10/1.  During this hospital stay patient continued to have hemodynamic instability requiring 8 units PRBC over her hospital course. She has been taken back to the OR  On 10/5, 10/10, 10/12, and 10/17 for further wound debridement. Today, after her procedure was completed and wound vac in place, she was in PACU where her blood pressure was noted to drop to 01U systolic. She was given albumin. Neo was initiated. Patient indicated she was dizzy and lightheaded. 3u PRBC were administered. PCCM was called for consult.  Of note patient had grossly bloody stool in the PACU with her hypotensive episode. Bowel movement was described as formed but grossly bloody/in a pool of blood. She denies prior episodes of melena or hematochezia. She endorses being constipated over past few days.   SUBJECTIVE: No response to 80 lasix 10/20 and 160 lasix 10/21.  Remains on low dose levophed, CVP 7.  Tolerating SBT this AM on PS 5/5.  Awake and follows all commands. Had some issues with hypoglycemia, 70 this AM.   VITAL SIGNS: BP 106/61   Pulse 74   Temp 98.4 F (36.9 C) (Oral)    Resp (!) 26   Ht 5' 2.99" (1.6 m)   Wt 129.3 kg (285 lb)   SpO2 98%   BMI 50.50 kg/m   HEMODYNAMICS: CVP:  [3 mmHg-7 mmHg] 7 mmHg  VENTILATOR SETTINGS: Vent Mode: PSV;CPAP FiO2 (%):  [40 %] 40 % Set Rate:  [16 bmp] 16 bmp Vt Set:  [450 mL] 450 mL PEEP:  [5 cmH20] 5 cmH20 Pressure Support:  [5 cmH20] 5 cmH20 Plateau Pressure:  [16 cmH20-20 cmH20] 19 cmH20  INTAKE / OUTPUT: I/O last 3 completed shifts: In: 2841.5 [I.V.:2318.5; Blood:273; IV Piggyback:250] Out: 2100 [Emesis/NG output:450; Drains:1650]  PHYSICAL EXAMINATION: General:  Adult female, in NAD. Neuro:  Awake, follows commands. Cardiovascular:  IRIR, no M/R/G. Lungs:  CTA anteriorly, MV, no W/R/R. Abdomen:  Surgical dressing is clean and dry. Abdomen mildly distended and appropriately tender without rebound or guarding. JP drain with bilious. Musculoskeletal: Left leg wound vac in place, leg is wrapped, bandage is clean and dry. Skin:  No rashes or lesions. Dusky extremities.  LABS:  BMET  Recent Labs Lab 06/08/17 1055 06/08/17 1745 06/09/17 0500  NA 140 141 141  K 3.8 4.0 3.9  CL 107 107 109  CO2 26 25 25   BUN 52* 55* 56*  CREATININE 2.62* 2.74* 2.94*  GLUCOSE 90 86 77    Electrolytes  Recent Labs Lab 06/15/2017 0356  06/08/17 0550 06/08/17 1055 06/08/17 1745 06/09/17 0500  CALCIUM 8.5*  < > 7.6* 7.7* 7.8* 7.7*  MG 2.0  --  1.2*  --   --  1.5*  PHOS  --   --  4.2  --   --  4.8*  < > = values in this interval not displayed.  CBC  Recent Labs Lab 06/07/17 1632 06/08/17 0200 06/09/17 0500  WBC 21.0* 22.8* 19.9*  HGB 11.2* 11.5* 11.3*  HCT 33.5* 34.4* 35.1*  PLT 49* 41* 48*    Coag's  Recent Labs Lab 06/05/17 1225 06/05/17 2303 05/29/2017 0719 06/07/17 0500 06/08/17 0830 06/09/17 0500  APTT 36 34 33  --   --   --   INR 2.44 1.55 1.51 1.59 1.45 1.47    Sepsis Markers  Recent Labs Lab 06/05/17 1154 06/05/17 2252  LATICACIDVEN 6.2* 4.2*    ABG  Recent Labs Lab  06/07/17 0450 06/07/17 0845 06/08/17 0450  PHART 7.493* 7.405 7.349*  PCO2ART 33.4 42.2 47.7  PO2ART 81.5* 75.0* 92.1    Liver Enzymes  Recent Labs Lab 06/03/17 0526 06/05/17 0413 06/09/17 0500  AST 32 2,448* 121*  ALT <5* 389* 19  ALKPHOS 194* 92 130*  BILITOT 0.6 3.5* 1.6*  ALBUMIN 1.4* 1.6* 1.4*    Cardiac Enzymes  Recent Labs Lab 05/26/2017 1520 06/05/17 1208  TROPONINI 0.04* 0.38*    Glucose  Recent Labs Lab 06/08/17 1624 06/08/17 2000 06/08/17 2035 06/09/17 0013 06/09/17 0400 06/09/17 0811  GLUCAP 170* 64* 91 73 74 70    Imaging Dg Chest Port 1 View  Result Date: 06/09/2017 CLINICAL DATA:  82 year old female with acute respiratory failure. EXAM: PORTABLE CHEST 1 VIEW COMPARISON:  06/08/2017. FINDINGS: Endotracheal tube tip 3 cm above the carina with slight angulation of the trachea to the right unchanged. Right central line tip proximal superior vena cava level. Nasogastric tube courses below the diaphragm. Tip is not included on the present exam. Cardiomegaly. Pulmonary vascular congestion/ mild pulmonary edema with bilateral pleural effusions suspected. Bibasilar atelectasis.  Basilar infiltrates secondary consideration. No pneumothorax. IMPRESSION: Overall no significant change. Pulmonary edema with bilateral pleural effusions. Cardiomegaly. Bibasilar atelectasis suspected. Basilar infiltrates secondary consideration. Electronically Signed   By: Genia Del M.D.   On: 06/09/2017 07:13     STUDIES:  Renal US 10/22 >   CULTURES: Surgical wound culture 10/10 >> E coli, Pseudomonas, Morganella morganii, proteus, VRE  ANTIBIOTICS: 10/10 Ancef >> 10/12 10/15 Linezolid >> 10/21 10/12 Zosyn >>   SIGNIFICANT EVENTS: 10/5 Left thigh and left leg debridement 10/10 Repeat irrigation and debridement left thigh and left leg 10/12 Debridement left leg and thigh 10/17 Debridement left thigh wound 10/17 EGD, exploratory laparotomy with bleeding duodenal  ulcer/perforated duodenum. Intubated, maxed on 4 pressors in ICU.  LINES/TUBES: PIV x3 Foley catheter Negative pressure wound L leg Closed system drain RLQ bulb CVC triple lumen NG/OG  DISCUSSION: 82 year old female with history of HFpEF, necrotizing fasciitis of left lower leg, HLD, tricuspid regurg, pulmonary hypertension, CKD III, COPD on 2L at night, presents after repeat left lower extremity debridement with acute hypotension. Subsequently found to have duodenal ulcer with perforation, in hemorrhagic shock. Maxed on 4 pressors in ICU, intubated.   ASSESSMENT / PLAN:  GASTROINTESTINAL A:   Acute GI bleed 2/2 duodenal ulcer with hemorrhagic shock - s/p ex lap with repair 10/17 Shock liver; ALT 389, AST 2448 Nutrition P:   Surgery, GI following May have UGI series 10/23 PPI gtt Follow coags, CBC Start TNA per pharm  PULMONARY A: Pulmonary Hypertension - [facility] 11/2015 with PAH 59/17 mmHg Mechanically ventilated P:   Holding home lasix, letaris SBT this AM, probable extubation Bronchial hygiene CXR intermittently  CARDIOVASCULAR A:  HFpEF (Last echo 05/21/2017 indicates EF 60-65%, moderate MR, mod pulm HTN) with intermittent episodes of hypotension. Hypovolemic shock requiring massive blood transfusion Paroxysmal Atrial fibrillation CAD Elevated troponin P:  Continue levophed as needed, goal MAP > 65 No anticoagulation due to GIB Follow CVP Monitor intake/output  RENAL A:   AKI on CKD stage III (baseline Cr 0.8-1.1), worsening.  Continues to have anuria Hypomagnesemia P:   Assess renal US May need nephrology consult for CVVHD given anuria (no emergent need at this time) Monitor electrolytes Follow BMP q12hrs given anuria (watch for hyperK)  HEMATOLOGIC A:   Hemorrhagic shock requiring massive blood transfusions - s/p received 15u PRBCs, 1u cryo, 5u platelets, 9u ffp. Last transfusion of PRBC was 10/19 AM Acute blood loss anemia - due to above P:   Transfuse for Hgb < 8 CBC in AM  INFECTIOUS A:   Surgical wound culture 10/10 >> E coli, Pseudomonas, Morganella morganii, proteus, VRE P:   Continue Zosyn (started 10/12) Linezolid d/c'd 10/21 due to worsening thrombocytopenia Follow wound cultures Monitor fever curve, leukocytosis (worsened with steroids given 10/18)  ENDOCRINE A:   Hypoglycemia - 66 overnight P:   Continue D5NS  NEUROLOGIC A:   Remains neurologically intact, no acute process  P:   Monitor mental status  FAMILY  - Inter-disciplinary family meet or Palliative Care meeting due by:  06/11/2017.  CC time: 35 min.   Montey Hora, [facility] Pager: [phone]  or [phone] 06/09/2017, 11:00 AM  STAFF NOTE: I, Merrie Roof, MD FACP have personally reviewed patient's available data, including medical history, events of note, physical examination and test results as part of my evaluation. I have discussed with resident/NP and other care providers such as pharmacist, RN and RRT. In addition, I personally evaluated patient and elicited key findings of: awake, fc well, jvd up, crackles, abdo no BS, tender, no r/g, open abdo wound, edema present a lot, pcxr I reviewed shows pulm edema, ett too low, will adjust out 1 cm, pcxr in am , last ABg reviewed, keep same MV, wean sbt episode desat, repeat wean again in pm , lasix given min response, escalate again today, may need HD, bladder scan repeat, incontinence?, maintain foley, chem q8h, consider tpn soon, hope can use IJ line in place, upright position, getting renal US, assessing cvp 7-9 accuracy ? Her exam is gross overload, may need noninvasive monitoring, may consider albumin, family updated by me, may need plat tx for HD cath if placed The patient is critically ill with multiple organ systems failure and requires high complexity decision making for assessment and support, frequent evaluation and titration of  therapies, application of advanced monitoring technologies and extensive interpretation of multiple databases.   Critical Care Time devoted to patient care services described in this note is 35 Minutes. This time reflects time of care of this signee: Merrie Roof, MD FACP. This critical care time does not reflect procedure time, or teaching time or supervisory time of PA/NP/Med student/Med Resident etc but could involve care discussion time. Rest per NP/medical resident whose note is outlined above and that I agree with   Lavon Paganini. Titus Mould, MD, [place] Pgr: [facility] 06/09/2017 12:09 PM

## 2017-06-09 NOTE — Progress Notes (Signed)
Hypoglycemic Event  CBG: 67  Treatment: D50 IV 25 mL  Symptoms: None  Follow-up CBG: Time:1652 CBG Result:123  Possible Reasons for Event: Inadequate meal intake  Comments/MD notified:yes    [name] L

## 2017-06-09 NOTE — Progress Notes (Signed)
Nutrition Consult / Follow-up  DOCUMENTATION CODES:   Morbid obesity  INTERVENTION:   TPN dosing per Pharmacy  NUTRITION DIAGNOSIS:   Inadequate oral intake related to inability to eat as evidenced by NPO status.  Ongoing  GOAL:   Provide needs based on ASPEN/SCCM guidelines  Being addressed with TPN  MONITOR:   Vent status, Skin, I & O's, Labs  REASON FOR ASSESSMENT:   Consult New TPN/TNA  ASSESSMENT:   82 yo female with PMH of HTN, HLD, CAD, morbid obesity, GERD, degenerative arthritis, recent fall at home (2 weeks PTA) who was admitted on 10/1 with acute on chronic CHF exacerbation, hypotension, elevated troponin, and L leg hematoma. S/P I&D of L thigh and leg on 10/5 for necrotizing fasciitis, I&D was repeated on 10/10, 10/12, 10/17. Found to have GI bleeding from perforated duodenal ulcer and underwent exploratory laparotomy, oversewing, and pyloroplasty on 10/17. Remained on vent post-op.  Patient remains on ventilator support.  Temp (24hrs), Avg:98.5 F (36.9 C), Min:98.2 F (36.8 C), Max:98.8 F (37.1 C)  Prolonged NPO status expected. TPN being initiated today.  Clinimix 5/15 to start at 30 ml/h with goal rate of 100 ml/h which will provide 1704 kcal and 120 gm protein. Lipid infusion to be held for 7 days per ASPEN guidelines. Labs and medications reviewed.  Diet Order:  Diet NPO time specified TPN (CLINIMIX) Adult without lytes  Skin:  Wound (see comment) (stg 2-coccyx, buttocks, thigh; closed surg inc L leg x 2&abd)  Last BM:  10/22 (type 7-liquid)  Height:   Ht Readings from Last 1 Encounters:  05/22/2017 5' 2.99" (1.6 m)    Weight:   Wt Readings from Last 1 Encounters:  06/09/17 285 lb 0.9 oz (129.3 kg)    Ideal Body Weight:  52.3 kg  BMI:  Body mass index is 50.51 kg/m.  Estimated Nutritional Needs:   Kcal:  3244-0102  Protein:  130 gm  Fluid:  1.8-2 L  EDUCATION NEEDS:   No education needs identified at this  time  Molli Barrows, [place], [place], [place] Pager [phone] After Hours Pager [phone]

## 2017-06-09 NOTE — Consult Note (Signed)
Reason for Consult: anuria  Referring Physician: Dr. Nikki Dom is an 82 y.o. female PMH HFpEF (10/18 EF 60-65%), pulmonary hypertension, mitral valve prolapse and tricuspid regurgitation, CKD III, COPD, and paroxysmal atrial fibrillation  HPI: [name] was admit 10/1 after arriving from a rehab facility with worsening leg hematoma and found to have necrotizing fascitis. She has had 4 wound debridements. Her course has been complicated by GI bleed due to a perforated duodenal ulcer and anuria.  Baseline kidney function is consistent with CKD stage III. She does not follow with nephrology and denies NSAID or ACE/ARB use. She has had no contrast studies during this admission. Baseline creatinine is 1.03 9/2 and through the course of her admission her creatinine has gradually increased, now to 3.9. She does have an elevated BUN but potassium and anion gap are not elevated. She has had decreased urine output, last recorded good urine output was 1.8 liters on 10/16. This was the day prior to the ex-lap when her duodenum was found to be perforated.   Trend in Creatinine: Creat  Date/Time Value Ref Range Status  01/31/2016 11:18 AM 1.10 (H) 0.60 - 0.93 mg/dL Final    Comment:      For patients > or = 80 years of age: The upper reference limit for Creatinine is approximately 13% higher for people identified as African-American.      Creatinine, Ser  Date/Time Value Ref Range Status  06/09/2017 05:00 AM 2.94 (H) 0.44 - 1.00 mg/dL Final  06/08/2017 05:45 PM 2.74 (H) 0.44 - 1.00 mg/dL Final  06/08/2017 10:55 AM 2.62 (H) 0.44 - 1.00 mg/dL Final  06/08/2017 05:50 AM 2.59 (H) 0.44 - 1.00 mg/dL Final  06/07/2017 07:20 PM 2.35 (H) 0.44 - 1.00 mg/dL Final  06/07/2017 04:32 PM 2.31 (H) 0.44 - 1.00 mg/dL Final  06/07/2017 02:03 AM 2.05 (H) 0.44 - 1.00 mg/dL Final  05/22/2017 03:40 PM 1.81 (H) 0.44 - 1.00 mg/dL Final  06/08/2017 08:24 AM 1.73 (H) 0.44 - 1.00 mg/dL Final  06/09/2017  02:39 AM 1.64 (H) 0.44 - 1.00 mg/dL Final  06/05/2017 09:11 PM 1.52 (H) 0.44 - 1.00 mg/dL Final  06/05/2017 03:23 PM 1.45 (H) 0.44 - 1.00 mg/dL Final  06/05/2017 12:08 PM 1.34 (H) 0.44 - 1.00 mg/dL Final  06/05/2017 08:20 AM 1.37 (H) 0.44 - 1.00 mg/dL Final  06/05/2017 04:13 AM 1.36 (H) 0.44 - 1.00 mg/dL Final  06/05/2017 01:20 AM 1.00 0.44 - 1.00 mg/dL Final  05/22/2017 09:11 PM 0.90 0.44 - 1.00 mg/dL Final  06/11/2017 03:20 PM 0.97 0.44 - 1.00 mg/dL Final  05/24/2017 03:56 AM 0.76 0.44 - 1.00 mg/dL Final  06/03/2017 05:26 AM 0.92 0.44 - 1.00 mg/dL Final  06/01/2017 03:14 AM 1.03 (H) 0.44 - 1.00 mg/dL Final  05/31/2017 05:57 AM 1.08 (H) 0.44 - 1.00 mg/dL Final  06/13/2017 02:49 AM 1.25 (H) 0.44 - 1.00 mg/dL Final  05/29/2017 05:10 AM 1.21 (H) 0.44 - 1.00 mg/dL Final  06/01/2017 04:52 AM 0.66 0.44 - 1.00 mg/dL Final  05/27/2017 02:50 AM 1.05 (H) 0.44 - 1.00 mg/dL Final  05/26/2017 03:21 AM 1.17 (H) 0.44 - 1.00 mg/dL Final  05/24/2017 06:44 AM 0.86 0.44 - 1.00 mg/dL Final  06/08/2017 01:58 AM 0.91 0.44 - 1.00 mg/dL Final  05/22/2017 04:34 AM 0.92 0.44 - 1.00 mg/dL Final  05/20/2017 05:02 AM 0.97 0.44 - 1.00 mg/dL Final  05/26/2017 03:22 PM 1.14 (H) 0.44 - 1.00 mg/dL Final  05/15/2017 10:49 AM 1.03 (H) 0.44 -  1.00 mg/dL Final  05/14/2017 05:53 AM 0.81 0.44 - 1.00 mg/dL Final  05/13/2017 02:11 AM 0.88 0.44 - 1.00 mg/dL Final  05/12/2017 02:54 AM 1.28 (H) 0.44 - 1.00 mg/dL Final  05/11/2017 05:34 AM 1.59 (H) 0.44 - 1.00 mg/dL Final  05/10/2017 06:57 PM 1.69 (H) 0.44 - 1.00 mg/dL Final  10/17/2016 02:59 PM 1.35 (H) 0.44 - 1.00 mg/dL Final  02/10/2016 11:32 AM 1.21 (H) 0.44 - 1.00 mg/dL Final  11/30/2015 12:21 PM 0.92 0.44 - 1.00 mg/dL Final  11/22/2015 02:31 AM 1.12 (H) 0.44 - 1.00 mg/dL Final  11/21/2015 05:18 AM 1.17 (H) 0.44 - 1.00 mg/dL Final  11/20/2015 03:00 AM 1.18 (H) 0.44 - 1.00 mg/dL Final  11/19/2015 04:01 AM 1.16 (H) 0.44 - 1.00 mg/dL Final  11/18/2015 02:29 AM 1.10 (H)  0.44 - 1.00 mg/dL Final  11/17/2015 03:01 PM 1.03 (H) 0.44 - 1.00 mg/dL Final  11/17/2015 10:10 AM 1.05 (H) 0.44 - 1.00 mg/dL Final  10/05/2013 06:32 PM 0.83 0.50 - 1.10 mg/dL Final  02/11/2013 08:22 AM 0.81 0.50 - 1.10 mg/dL Final  05/18/2012 11:31 AM 1.0 0.4 - 1.2 mg/dL Final  11/29/2011 09:31 AM 1.00 0.50 - 1.10 mg/dL Final    PMH:   Past Medical History:  Diagnosis Date  . A-fib (HCC)   . Arthritis    "knees" (11/17/2015)  . CAD (coronary artery disease)    BMS to LAD 07/2005  . Chronic atrial fibrillation (HCC)    Since 2011  . Degenerative arthritis of right knee   . Essential hypertension   . GERD (gastroesophageal reflux disease)   . Hyperlipidemia   . Morbid obesity (HCC)   . Myocardial infarction (HCC) 07/2005  . On home oxygen therapy    "2.5L at night" (11/17/2015)  . Psoriasis   . Pulmonary hypertension (HCC)    Mixed, PASP 67 mmHg at [facility] 2013  . Right ventricular dysfunction   . Tricuspid regurgitation    Severe    PSH:   Past Surgical History:  Procedure Laterality Date  . ACHILLES TENDON SURGERY Right 1970s  . CARDIAC CATHETERIZATION N/A 12/07/2015   Procedure: Right Heart Cath;  Surgeon: Daniel R Bensimhon, MD;  Location: [facility];  Service: Cardiovascular;  Laterality: N/A;  . CARPAL TUNNEL RELEASE  11/29/2011   Procedure: CARPAL TUNNEL RELEASE;  Surgeon: Robert V Sypher Jr., MD;  Location: [facility];  Service: Orthopedics;  Laterality: Right;  . COLONOSCOPY    . CORONARY ANGIOPLASTY WITH STENT PLACEMENT  07/2005   Dr. Brodie  . ESOPHAGOGASTRODUODENOSCOPY N/A 06/13/2017   Procedure: ESOPHAGOGASTRODUODENOSCOPY (EGD);  Surgeon: Jacobs, Daniel P, MD;  Location: [facility];  Service: Endoscopy;  Laterality: N/A;  . EYE SURGERY    . I&D EXTREMITY Left 05/24/2017   Procedure: IRRIGATION AND DEBRIDEMENT LEFT THIGH AND LEFT LEG;  Surgeon: Duda, Marcus V, MD;  Location: [facility];  Service: Orthopedics;  Laterality: Left;  . I&D EXTREMITY  Left 05/31/2017   Procedure: REPEAT IRRIGATION AND DEBRIDEMENT LEFT THIGH AND LEFT LEG;  Surgeon: Duda, Marcus V, MD;  Location: [facility];  Service: Orthopedics;  Laterality: Left;  . I&D EXTREMITY Left 05/27/2017   Procedure: DEBRIDEMENT LEFT THIGH WOUND;  Surgeon: Duda, Marcus V, MD;  Location: [facility];  Service: Orthopedics;  Laterality: Left;  . LAPAROSCOPIC CHOLECYSTECTOMY    . LAPAROTOMY N/A 06/05/2017   Procedure: EXPLORATORY LAPAROTOMY REPAIR OF DUODENAL ULCER;  Surgeon: Cornett, Thomas, MD;  Location: [facility];  Service: General;  Laterality:   N/A;  . RETINAL DETACHMENT SURGERY Right   . SKIN SPLIT GRAFT Left 06/16/2017   Procedure: DEBRIDEMENT LEFT LEG AND THIGH;  Surgeon: Duda, Marcus V, MD;  Location: [facility];  Service: Orthopedics;  Laterality: Left;    Allergies: No Known Allergies  Medications:   Prior to Admission medications   Medication Sig Start Date End Date Taking? Authorizing Provider  ALPRAZolam (XANAX) 0.5 MG tablet Take 1 tablet (0.5 mg total) by mouth at bedtime. 05/16/17  Yes Arrien, Mauricio Daniel, MD  ambrisentan (LETAIRIS) 5 MG tablet Take 1 tablet (5 mg total) by mouth daily. 05/21/17  Yes Byrum, Robert S, MD  atorvastatin (LIPITOR) 80 MG tablet TAKE 1/2 TABLET BY MOUTH DAILY Patient taking differently: TAKE 1/2 TABLET (40 MG) BY MOUTH DAILY AT BEDTIME 08/07/15  Yes McDowell, Samuel G, MD  azelastine (ASTELIN) 0.1 % nasal spray Place 2 sprays into both nostrils 2 (two) times daily as needed (congestion).  02/21/17  Yes [provider]  brimonidine (ALPHAGAN) 0.2 % ophthalmic solution Place 1 drop into the right eye 2 (two) times daily.  04/21/15  Yes [provider]  brinzolamide (AZOPT) 1 % ophthalmic suspension Place 1 drop into both eyes 2 (two) times daily.   Yes [provider]  HYDROcodone-acetaminophen (NORCO/VICODIN) 5-325 MG tablet Take 1 tablet by mouth 3 (three) times daily as needed for moderate pain. 05/16/17  Yes Arrien, Mauricio Daniel,  MD  latanoprost (XALATAN) 0.005 % ophthalmic solution Place 1 drop into the right eye at bedtime.  03/30/12  Yes [provider]  nitroGLYCERIN (NITROSTAT) 0.4 MG SL tablet Place 0.4 mg under the tongue every 5 (five) minutes as needed for chest pain (x 3 tablets daily). Reported on 12/28/2015   Yes [provider]  Omeprazole 20 MG TBEC Take 20 mg by mouth daily.    Yes [provider]  OXYGEN Inhale 2.5 L into the lungs at bedtime.   Yes [provider]  timolol (TIMOPTIC) 0.5 % ophthalmic solution Place 1 drop into both eyes 2 (two) times daily. 11/20/15  Yes [provider]    Inpatient medications: . brimonidine  1 drop Right Eye BID  . brinzolamide  1 drop Both Eyes BID  . chlorhexidine gluconate (MEDLINE KIT)  15 mL Mouth Rinse BID  . Chlorhexidine Gluconate Cloth  6 each Topical Daily  . docusate sodium  100 mg Oral BID  . Gerhardt's butt cream   Topical BID  . insulin aspart  0-9 Units Subcutaneous Q4H  . latanoprost  1 drop Right Eye QHS  . mouth rinse  15 mL Mouth Rinse QID  . sodium chloride flush  3 mL Intravenous Q12H  . timolol  1 drop Both Eyes BID    Discontinued Meds:   Medications Discontinued During This Encounter  Medication Reason  . alum hydroxide-mag trisilicate (GAVISCON) 80-20 MG CHEW chewable tablet Dose change  . PRESCRIPTION MEDICATION Discontinued by provider  . furosemide (LASIX) injection 40 mg   . potassium chloride 20 MEQ/15ML (10%) solution 40 mEq   . furosemide (LASIX) injection 40 mg   . heparin injection 5,000 Units   . HYDROcodone-acetaminophen (NORCO/VICODIN) 5-325 MG per tablet 1 tablet   . 0.9 % irrigation (POUR BTL) Patient Discharge  . povidone-iodine 10 % swab 2 application Patient Transfer  . fentaNYL (SUBLIMAZE) injection 25-50 mcg Patient Transfer  . ondansetron (ZOFRAN) injection 4 mg Patient Transfer  . 0.9 %  sodium chloride infusion   . 0.9 %    sodium chloride infusion   . acetaminophen  (TYLENOL) tablet 650 mg   . furosemide (LASIX) injection 40 mg   . lactated ringers infusion   . metoCLOPramide (REGLAN) tablet 5-10 mg   . metoCLOPramide (REGLAN) injection 5-10 mg   . ondansetron (ZOFRAN) injection 4 mg   . 0.9 %  sodium chloride infusion   . sodium chloride irrigation 0.9 % Patient Discharge  . 0.9 % irrigation (POUR BTL) Patient Discharge  . oxyCODONE (Oxy IR/ROXICODONE) immediate release tablet 5 mg Patient Transfer  . oxyCODONE (ROXICODONE) 5 MG/5ML solution 5 mg Patient Transfer  . HYDROmorphone (DILAUDID) injection 0.25-0.5 mg Patient Transfer  . promethazine (PHENERGAN) injection 6.25-12.5 mg Patient Transfer  . acetaminophen (TYLENOL) tablet 650 mg Duplicate  . acetaminophen (TYLENOL) suppository 650 mg Duplicate  . oxyCODONE (Oxy IR/ROXICODONE) immediate release tablet 5-10 mg   . HYDROmorphone (DILAUDID) injection 1 mg Duplicate  . methocarbamol (ROBAXIN) tablet 500 mg Duplicate  . methocarbamol (ROBAXIN) 500 mg in dextrose 5 % 50 mL IVPB Duplicate  . polyethylene glycol (MIRALAX / GLYCOLAX) packet 17 g Duplicate  . bisacodyl (DULCOLAX) suppository 10 mg Duplicate  . magnesium citrate solution 1 Bottle   . ondansetron (ZOFRAN) tablet 4 mg Duplicate  . ondansetron (ZOFRAN) injection 4 mg Duplicate  . midodrine (PROAMATINE) tablet 5 mg   . lactated ringers infusion   . acetaminophen (TYLENOL) tablet 650 mg   . acetaminophen (TYLENOL) suppository 650 mg   . sodium chloride irrigation 0.9 % Patient Discharge  . 0.9 % irrigation (POUR BTL) Patient Discharge  . chlorhexidine (HIBICLENS) 4 % liquid 4 application Patient Transfer  . povidone-iodine 10 % swab 2 application Patient Transfer  . ceFAZolin (ANCEF) IVPB 2g/100 mL premix Patient Transfer  . HYDROmorphone (DILAUDID) injection 0.25-0.5 mg Patient Transfer  . promethazine (PHENERGAN) injection 6.25-12.5 mg Patient Transfer  . acetaminophen (TYLENOL) tablet 650 mg P&T Policy: Duplicate PRN Therapy  .  acetaminophen (TYLENOL) suppository 650 mg P&T Policy: Duplicate PRN Therapy  . oxyCODONE (Oxy IR/ROXICODONE) immediate release tablet 5-10 mg P&T Policy: Duplicate PRN Therapy  . HYDROmorphone (DILAUDID) injection 1 mg P&T Policy: Duplicate PRN Therapy  . methocarbamol (ROBAXIN) tablet 500 mg P&T Policy: Duplicate PRN Therapy  . methocarbamol (ROBAXIN) 500 mg in dextrose 5 % 50 mL IVPB P&T Policy: Duplicate PRN Therapy  . docusate sodium (COLACE) capsule 100 mg Duplicate  . docusate sodium (COLACE) capsule 100 mg Duplicate  . polyethylene glycol (MIRALAX / GLYCOLAX) packet 17 g Duplicate  . bisacodyl (DULCOLAX) suppository 10 mg Duplicate  . magnesium citrate solution 1 Bottle Duplicate  . ondansetron (ZOFRAN) tablet 4 mg P&T Policy: Duplicate PRN Therapy  . ondansetron (ZOFRAN) injection 4 mg P&T Policy: Duplicate PRN Therapy  . metoCLOPramide (REGLAN) tablet 5-10 mg P&T Policy: Duplicate PRN Therapy  . metoCLOPramide (REGLAN) injection 5-10 mg P&T Policy: Duplicate PRN Therapy  . ceFAZolin (ANCEF) IVPB 2g/100 mL premix   . 0.9 %  sodium chloride infusion   . 0.9 %  sodium chloride infusion   . lactated ringers infusion   . 0.9 %  sodium chloride infusion   . metoCLOPramide (REGLAN) tablet 5-10 mg   . metoCLOPramide (REGLAN) injection 5-10 mg   . ambrisentan (LETAIRIS) tablet 5 mg   . linezolid (ZYVOX) IVPB 600 mg   . vancomycin (VANCOCIN) 1,250 mg in sodium chloride 0.9 % 250 mL IVPB   . sodium chloride irrigation 0.9 % Patient Discharge  . 0.9 % irrigation (POUR BTL) Patient Discharge  .   HYDROmorphone (DILAUDID) injection 2.68-3.4 mg Duplicate  . HYDROmorphone (DILAUDID) injection 0.25-0.5 mg Patient Transfer  . meperidine (DEMEROL) injection 6.25-12.5 mg Patient Transfer  . promethazine (PHENERGAN) injection 6.25-12.5 mg Patient Transfer  . oxyCODONE (Oxy IR/ROXICODONE) immediate release tablet 5 mg Patient Transfer  . oxyCODONE (ROXICODONE) 5 MG/5ML solution 5 mg Patient Transfer   . albumin human 5 % solution 12.5 g Patient Transfer  . 0.9 %  sodium chloride infusion Patient Transfer  . phenylephrine (NEO-SYNEPHRINE) 10 mg in sodium chloride 0.9 % 250 mL (0.04 mg/mL) infusion Patient Transfer  . 0.9 %  sodium chloride infusion Patient Transfer  . furosemide (LASIX) injection 40 mg   . oxyCODONE (Oxy IR/ROXICODONE) immediate release tablet 5-10 mg   . acetaminophen (TYLENOL) tablet 196 mg Duplicate  . acetaminophen (TYLENOL) suppository 222 mg Duplicate  . HYDROmorphone (DILAUDID) injection 1 mg Duplicate  . methocarbamol (ROBAXIN) tablet 979 mg Duplicate  . methocarbamol (ROBAXIN) 500 mg in dextrose 5 % 50 mL IVPB Duplicate  . docusate sodium (COLACE) capsule 892 mg Duplicate  . polyethylene glycol (MIRALAX / GLYCOLAX) packet 17 g Duplicate  . bisacodyl (DULCOLAX) suppository 10 mg Duplicate  . ondansetron (ZOFRAN) tablet 4 mg Duplicate  . ondansetron (ZOFRAN) injection 4 mg Duplicate  . EPINEPHrine 1:10,000, 10 mL syringe/NS, 10 mL vial for sclerotherapy inj mixture Patient Discharge  . midazolam (VERSED) injection Patient Discharge  . fentaNYL (SUBLIMAZE) injection Patient Discharge  . butamben-tetracaine-benzocaine (CETACAINE) spray Patient Discharge  . 0.9 % irrigation (POUR BTL) Patient Discharge  . phenylephrine (NEO-SYNEPHRINE) 10 mg in sodium chloride 0.9 % 250 mL (0.04 mg/mL) infusion   . norepinephrine (LEVOPHED) 4 mg in dextrose 5 % 250 mL (0.016 mg/mL) infusion   . phenylephrine (NEO-SYNEPHRINE) 40 mg in sodium chloride 0.9 % 250 mL (0.16 mg/mL) infusion   . ambrisentan (LETAIRIS) tablet 5 mg   . midodrine (PROAMATINE) tablet 10 mg   . HYDROmorphone (DILAUDID) injection 1 mg   . hydrocortisone sodium succinate (SOLU-CORTEF) 100 MG injection 50 mg   . 0.9 %  sodium chloride infusion   . 0.9 %  sodium chloride infusion   . insulin aspart (novoLOG) injection 10 Units   . oxyCODONE (Oxy IR/ROXICODONE) immediate release tablet 5-10 mg   . phenylephrine  (NEO-SYNEPHRINE) 80 mg in sodium chloride 0.9 % 500 mL (0.16 mg/mL) infusion   . vasopressin (PITRESSIN) 40 Units in sodium chloride 0.9 % 250 mL (0.16 Units/mL) infusion   . EPINEPHrine (ADRENALIN) 4 mg in dextrose 5 % 250 mL (0.016 mg/mL) infusion   . sodium bicarbonate 150 mEq in dextrose 5 % 1,000 mL infusion   . 0.9 %  sodium chloride infusion   . magnesium citrate solution 1 Bottle   . linezolid (ZYVOX) IVPB 600 mg   . furosemide (LASIX) 160 mg in dextrose 5 % 50 mL IVPB     Social History:  reports that she quit smoking about 30 years ago. Her smoking use included Cigarettes. She has a 0.15 pack-year smoking history. She has never used smokeless tobacco. She reports that she does not drink alcohol or use drugs.  Family History:   Family History  Problem Relation Age of Onset  . Heart attack Mother   . Coronary artery disease Father   . Stroke Father   . Heart attack Brother   . Heart failure Brother   . COPD Brother   . Emphysema Sister   . Emphysema Brother     Pertinent items are noted in HPI.  Weight change: 0 lb (0 kg)  Intake/Output Summary (Last 24 hours) at 06/09/17 1438 Last data filed at 06/09/17 1200  Gross per 24 hour  Intake          1501.39 ml  Output              825 ml  Net           676.39 ml   BP (!) 69/48   Pulse 62   Temp 98.2 F (36.8 C) (Oral)   Resp 16   Ht 5' 2.99" (1.6 m)   Wt 285 lb 0.9 oz (129.3 kg)   SpO2 94%   BMI 50.51 kg/m  Vitals:   06/09/17 1215 06/09/17 1226 06/09/17 1230 06/09/17 1245  BP: (!) 82/47  (!) 88/66 (!) 69/48  Pulse: (!) 55  (!) 54 62  Resp: _0 Temp:  98.2 F (36.8 C)    TempSrc:  Oral    SpO2: 99%  98% 94%  Weight:      Height:         General: no acute distress, awake, tracking, and nodding to questions  HEENT: ETT  Fundi benign Cardiac: irregular rhythm, no murmur appreciated Gr 2/6 M  Lungs: diffuse crackles over anterior lung fields  Abdomen: soft, non tender, non distended, drain with  serosanguinous fluid No Bs, abdm not tight Ext: 2+ bilateral lower extremity pitting edema, 1+ pitting in both hands, dressing over left leg clean and dry  Foley catheter with trace urine output   Labs: Basic Metabolic Panel:  Recent Labs Lab 06/03/17 0526  06/05/17 0413  06/07/17 0203 06/07/17 1632 06/07/17 1920 06/08/17 0550 06/08/17 1055 06/08/17 1745 06/09/17 0500  NA 137  < > 138  < > 140 142 138 139 140 141 141  K 2.9*  < > 5.0  < > 3.8 3.9 3.8 3.8 3.8 4.0 3.9  CL 89*  < > 107  < > 104 103 103 107 107 107 109  CO2 40*  < > 16*  < > _1 GLUCOSE 121*  < > 202*  < > 82 88 92 87 90 86 77  BUN 21*  < > 26*  < > 40* 44* 48* 52* 52* 55* 56*  CREATININE 0.92  < > 1.36*  < > 2.05* 2.31* 2.35* 2.59* 2.62* 2.74* 2.94*  ALBUMIN 1.4*  --  1.6*  --   --   --   --   --   --   --  1.4*  CALCIUM 8.3*  < > 8.0*  < > 7.8* 7.8* 7.7* 7.6* 7.7* 7.8* 7.7*  PHOS  --   --   --   --   --   --   --  4.2  --   --  4.8*  < > = values in this interval not displayed. Liver Function Tests:  Recent Labs Lab 06/03/17 0526 06/05/17 0413 06/09/17 0500  AST 32 2,448* 121*  ALT <5* 389* 19  ALKPHOS 194* 92 130*  BILITOT 0.6 3.5* 1.6*  PROT 4.4* 3.3* 4.2*  ALBUMIN 1.4* 1.6* 1.4*   No results for input(s): LIPASE, AMYLASE in the last 168 hours. No results for input(s): AMMONIA in the last 168 hours. CBC:  Recent Labs Lab 06/07/17 0800 06/07/17 1632 06/08/17 0200 06/09/17 0500  WBC 19.9* 21.0* 22.8* 19.9*  HGB 12.8 11.2* 11.5* 11.3*  HCT 36.8 33.5* 34.4* 35.1*  MCV 87.0  89.3 90.5 92.1  PLT 46* 49* 41* 48*   PT/INR: @LABRCNTIP(inr:5) Cardiac Enzymes: ) Recent Labs Lab 06/12/2017 1520 06/05/17 1208  TROPONINI 0.04* 0.38*   CBG:  Recent Labs Lab 06/08/17 2035 06/09/17 0013 06/09/17 0400 06/09/17 0811 06/09/17 1203  GLUCAP 91 73 74 70 70    Iron Studies: No results for input(s): IRON, TIBC, TRANSFERRIN, FERRITIN in the last 168 hours.  Xrays/Other  Studies: Dg Chest Port 1 View  Result Date: 06/09/2017 CLINICAL DATA:  82-year-old female with acute respiratory failure. EXAM: PORTABLE CHEST 1 VIEW COMPARISON:  06/08/2017. FINDINGS: Endotracheal tube tip 3 cm above the carina with slight angulation of the trachea to the right unchanged. Right central line tip proximal superior vena cava level. Nasogastric tube courses below the diaphragm. Tip is not included on the present exam. Cardiomegaly. Pulmonary vascular congestion/ mild pulmonary edema with bilateral pleural effusions suspected. Bibasilar atelectasis.  Basilar infiltrates secondary consideration. No pneumothorax. IMPRESSION: Overall no significant change. Pulmonary edema with bilateral pleural effusions. Cardiomegaly. Bibasilar atelectasis suspected. Basilar infiltrates secondary consideration. Electronically Signed   By: Steven  Olson M.D.   On: 06/09/2017 07:13   Dg Chest Port 1 View  Result Date: 06/08/2017 CLINICAL DATA:  Acute respiratory failure. EXAM: PORTABLE CHEST 1 VIEW COMPARISON:  06/07/2017. FINDINGS: Suboptimal rotated film. Increased cardiomediastinal silhouette. Support tubes and lines are unchanged. BILATERAL pulmonary opacities appear worse consistent with increasing pulmonary edema. IMPRESSION: BILATERAL pulmonary opacities appear worse consistent with increasing pulmonary edema. Increased cardiomediastinal silhouette. Electronically Signed   By: John T Curnes M.D.   On: 06/08/2017 07:15     Assessment/Plan: 1. AKI on CKD III (baseline crt 0.8) possibly secondary to hypovolemia and acute ischemic kidney injury. CVP and exam are consistent with 3rd spacing. Will continue workup and attempt to manage volume overload with lasix. Elevated BUN and phos but without anion gap or hyperkalemia. Not planning to initiate HD at this time but will reassess tomorrow. It is curious that her kidney filtering function remains intact despite the anuria and she does have high abdominal drain  output.  1. Continue lasix 160 q6h  2. Ordered urine na and osmolality, urinalysis with microscopy  3. Will follow up on renal ultrasound  Suspect will not respond to Lasix.  Slow rise Cr.  No acute need for Renal Replacement therapy. CVP ok. K and acid base ok.  R/O obstruction, inflammation in urine drug or post infectious.  2. Acute GI bleed s/p ex lap with repair - trending hgb  3. Hypotension - on levophed with CVP 6   3. Shock liver - transaminase improving 4. Thrombocytopenia - thinking this is related to linezolid and shock liver  5. Paroxysmal atrial fibrillation - holding anticoagulation for GIB  6. Necrotizing fascitis with multiple species on culture - on zosyn  7. Pulm HTN 8 Obesity 9. Afib 10 CAD   Nina Blum 06/09/2017, 2:38 PM  I have seen and examined this patient and agree with the plan of care seen, eval, examined, discussed with Nursing staff, resident. Changes made. .  DETERDING,JAMES L 06/09/2017, 3:47 PM  

## 2017-06-09 NOTE — Consult Note (Signed)
[facility] Nurse wound consult note Reason for Consult: NPWT dressing at the bedside First bedside change.  Patient intubated, given 50mg  Fentanyl IV per the bedside nures.   Wound type:LLE s/p debridement for necrotizing fascitis, multiple trips to the OR for debridements  Use of veracleanse dressings with instillation therapy  Pressure Injury POA: Yes/No/NA Measurement: did not measure, patient unstable during dressing change Wound bed: greater than 50% necrotic tissue present today Drainage (amount, consistency, odor)  Periwound: skin necrosis around the lateral side proximally.  Dressing procedure/placement/frequency: Images shared with Dr. Sharol Given, we will leave [facility] off for now. He will reevaluate the patient and discuss with family. She will most likely need amputation at this time. The family is aware and understands.    Elbert, Dana, [place]

## 2017-06-10 ENCOUNTER — Inpatient Hospital Stay (HOSPITAL_COMMUNITY): Payer: Medicare Other

## 2017-06-10 LAB — POCT I-STAT EG7
ACID-BASE DEFICIT: 6 mmol/L — AB (ref 0.0–2.0)
ACID-BASE DEFICIT: 4 mmol/L — AB (ref 0.0–2.0)
Acid-base deficit: 6 mmol/L — ABNORMAL HIGH (ref 0.0–2.0)
Acid-base deficit: 5 mmol/L — ABNORMAL HIGH (ref 0.0–2.0)
Acid-base deficit: 4 mmol/L — ABNORMAL HIGH (ref 0.0–2.0)
Acid-base deficit: 5 mmol/L — ABNORMAL HIGH (ref 0.0–2.0)
BICARBONATE: 22.3 mmol/L (ref 20.0–28.0)
BICARBONATE: 23.9 mmol/L (ref 20.0–28.0)
BICARBONATE: 24.8 mmol/L (ref 20.0–28.0)
Bicarbonate: 23.5 mmol/L (ref 20.0–28.0)
Bicarbonate: 24.5 mmol/L (ref 20.0–28.0)
Bicarbonate: 23.3 mmol/L (ref 20.0–28.0)
CALCIUM ION: 0.65 mmol/L — AB (ref 1.15–1.40)
CALCIUM ION: 1.08 mmol/L — AB (ref 1.15–1.40)
CALCIUM ION: 0.56 mmol/L — AB (ref 1.15–1.40)
Calcium, Ion: 1 mmol/L — ABNORMAL LOW (ref 1.15–1.40)
Calcium, Ion: 0.61 mmol/L — CL (ref 1.15–1.40)
Calcium, Ion: 0.85 mmol/L — CL (ref 1.15–1.40)
HCT: 35 % — ABNORMAL LOW (ref 36.0–46.0)
HCT: 41 % (ref 36.0–46.0)
HCT: 40 % (ref 36.0–46.0)
HCT: 39 % (ref 36.0–46.0)
HEMATOCRIT: 35 % — AB (ref 36.0–46.0)
HEMATOCRIT: 33 % — AB (ref 36.0–46.0)
HEMOGLOBIN: 13.9 g/dL (ref 12.0–15.0)
HEMOGLOBIN: 11.9 g/dL — AB (ref 12.0–15.0)
HEMOGLOBIN: 13.6 g/dL (ref 12.0–15.0)
Hemoglobin: 11.9 g/dL — ABNORMAL LOW (ref 12.0–15.0)
Hemoglobin: 11.2 g/dL — ABNORMAL LOW (ref 12.0–15.0)
Hemoglobin: 13.3 g/dL (ref 12.0–15.0)
O2 SAT: 57 %
O2 Saturation: 55 %
O2 Saturation: 58 %
O2 Saturation: 49 %
O2 Saturation: 52 %
O2 Saturation: 52 %
PCO2 VEN: 57.4 mmHg (ref 44.0–60.0)
PCO2 VEN: 57.3 mmHg (ref 44.0–60.0)
PCO2 VEN: 52.5 mmHg (ref 44.0–60.0)
PH VEN: 7.192 — AB (ref 7.250–7.430)
PH VEN: 7.258 (ref 7.250–7.430)
PO2 VEN: 35 mmHg (ref 32.0–45.0)
PO2 VEN: 32 mmHg (ref 32.0–45.0)
PO2 VEN: 27 mmHg — AB (ref 32.0–45.0)
PO2 VEN: 28 mmHg — AB (ref 32.0–45.0)
POTASSIUM: 3.4 mmol/L — AB (ref 3.5–5.1)
Patient temperature: 97.8
Patient temperature: 93.1
Potassium: 3.5 mmol/L (ref 3.5–5.1)
Potassium: 3.7 mmol/L (ref 3.5–5.1)
Potassium: 3.5 mmol/L (ref 3.5–5.1)
Potassium: 3 mmol/L — ABNORMAL LOW (ref 3.5–5.1)
Potassium: 3.4 mmol/L — ABNORMAL LOW (ref 3.5–5.1)
SODIUM: 142 mmol/L (ref 135–145)
SODIUM: 142 mmol/L (ref 135–145)
SODIUM: 142 mmol/L (ref 135–145)
Sodium: 143 mmol/L (ref 135–145)
Sodium: 142 mmol/L (ref 135–145)
Sodium: 142 mmol/L (ref 135–145)
TCO2: 24 mmol/L (ref 22–32)
TCO2: 26 mmol/L (ref 22–32)
TCO2: 25 mmol/L (ref 22–32)
TCO2: 26 mmol/L (ref 22–32)
TCO2: 25 mmol/L (ref 22–32)
TCO2: 27 mmol/L (ref 22–32)
pCO2, Ven: 61.8 mmHg — ABNORMAL HIGH (ref 44.0–60.0)
pCO2, Ven: 60.9 mmHg — ABNORMAL HIGH (ref 44.0–60.0)
pCO2, Ven: 53.4 mmHg (ref 44.0–60.0)
pH, Ven: 7.195 — CL (ref 7.250–7.430)
pH, Ven: 7.211 — ABNORMAL LOW (ref 7.250–7.430)
pH, Ven: 7.219 — ABNORMAL LOW (ref 7.250–7.430)
pH, Ven: 7.238 — ABNORMAL LOW (ref 7.250–7.430)
pO2, Ven: 37 mmHg (ref 32.0–45.0)
pO2, Ven: 35 mmHg (ref 32.0–45.0)

## 2017-06-10 LAB — RENAL FUNCTION PANEL
ALBUMIN: 1.4 g/dL — AB (ref 3.5–5.0)
ANION GAP: 12 (ref 5–15)
BUN: 54 mg/dL — ABNORMAL HIGH (ref 6–20)
CALCIUM: 8.4 mg/dL — AB (ref 8.9–10.3)
CO2: 23 mmol/L (ref 22–32)
Chloride: 104 mmol/L (ref 101–111)
Creatinine, Ser: 2.73 mg/dL — ABNORMAL HIGH (ref 0.44–1.00)
GFR, EST AFRICAN AMERICAN: 18 mL/min — AB (ref >60)
GFR, EST NON AFRICAN AMERICAN: 15 mL/min — AB (ref >60)
Glucose, Bld: 188 mg/dL — ABNORMAL HIGH (ref 65–99)
PHOSPHORUS: 4.2 mg/dL (ref 2.5–4.6)
POTASSIUM: 3.7 mmol/L (ref 3.5–5.1)
SODIUM: 139 mmol/L (ref 135–145)

## 2017-06-10 LAB — BLOOD GAS, ARTERIAL
Acid-Base Excess: 3 mmol/L — ABNORMAL HIGH (ref 0.0–2.0)
BICARBONATE: 26.5 mmol/L (ref 20.0–28.0)
DRAWN BY: 414221
FIO2: 60
O2 Saturation: 93.8 %
PCO2 ART: 36.6 mmHg (ref 32.0–48.0)
PEEP: 5 cmH2O
PH ART: 7.473 — AB (ref 7.350–7.450)
PO2 ART: 60.6 mmHg — AB (ref 83.0–108.0)
Patient temperature: 98.6
RATE: 26 resp/min
VT: 450 mL

## 2017-06-10 LAB — CBC
HEMATOCRIT: 37.6 % (ref 36.0–46.0)
HEMATOCRIT: 38.4 % (ref 36.0–46.0)
HEMOGLOBIN: 12.4 g/dL (ref 12.0–15.0)
Hemoglobin: 11.7 g/dL — ABNORMAL LOW (ref 12.0–15.0)
MCH: 29.3 pg (ref 26.0–34.0)
MCH: 30.4 pg (ref 26.0–34.0)
MCHC: 31.1 g/dL (ref 30.0–36.0)
MCHC: 32.3 g/dL (ref 30.0–36.0)
MCV: 94 fL (ref 78.0–100.0)
MCV: 94.1 fL (ref 78.0–100.0)
Platelets: 46 10*3/uL — ABNORMAL LOW (ref 150–400)
Platelets: 34 10*3/uL — ABNORMAL LOW (ref 150–400)
RBC: 4 MIL/uL (ref 3.87–5.11)
RBC: 4.08 MIL/uL (ref 3.87–5.11)
RDW: 16.7 % — AB (ref 11.5–15.5)
RDW: 17 % — AB (ref 11.5–15.5)
WBC: 14.8 10*3/uL — AB (ref 4.0–10.5)
WBC: 11.6 10*3/uL — AB (ref 4.0–10.5)

## 2017-06-10 LAB — DIFFERENTIAL
BASOS ABS: 0 10*3/uL (ref 0.0–0.1)
BASOS PCT: 0 %
EOS ABS: 0.1 10*3/uL (ref 0.0–0.7)
EOS PCT: 1 %
LYMPHS PCT: 6 %
Lymphs Abs: 0.8 10*3/uL (ref 0.7–4.0)
MONO ABS: 0.5 10*3/uL (ref 0.1–1.0)
Monocytes Relative: 3 %
NEUTROS ABS: 13.4 10*3/uL — AB (ref 1.7–7.7)
NEUTROS PCT: 91 %

## 2017-06-10 LAB — TRIGLYCERIDES: TRIGLYCERIDES: 114 mg/dL (ref <150)

## 2017-06-10 LAB — GLUCOSE, CAPILLARY
GLUCOSE-CAPILLARY: 112 mg/dL — AB (ref 65–99)
GLUCOSE-CAPILLARY: 147 mg/dL — AB (ref 65–99)
Glucose-Capillary: 105 mg/dL — ABNORMAL HIGH (ref 65–99)
Glucose-Capillary: 207 mg/dL — ABNORMAL HIGH (ref 65–99)
Glucose-Capillary: 97 mg/dL (ref 65–99)

## 2017-06-10 LAB — COMPREHENSIVE METABOLIC PANEL
ALBUMIN: 1.4 g/dL — AB (ref 3.5–5.0)
ALT: 11 U/L — ABNORMAL LOW (ref 14–54)
ANION GAP: 13 (ref 5–15)
AST: 49 U/L — AB (ref 15–41)
Alkaline Phosphatase: 139 U/L — ABNORMAL HIGH (ref 38–126)
BILIRUBIN TOTAL: 1.6 mg/dL — AB (ref 0.3–1.2)
BUN: 63 mg/dL — AB (ref 6–20)
CHLORIDE: 107 mmol/L (ref 101–111)
CO2: 22 mmol/L (ref 22–32)
Calcium: 8 mg/dL — ABNORMAL LOW (ref 8.9–10.3)
Creatinine, Ser: 3.22 mg/dL — ABNORMAL HIGH (ref 0.44–1.00)
GFR calc Af Amer: 15 mL/min — ABNORMAL LOW (ref >60)
GFR calc non Af Amer: 13 mL/min — ABNORMAL LOW (ref >60)
GLUCOSE: 109 mg/dL — AB (ref 65–99)
POTASSIUM: 3.8 mmol/L (ref 3.5–5.1)
SODIUM: 142 mmol/L (ref 135–145)
Total Protein: 4.2 g/dL — ABNORMAL LOW (ref 6.5–8.1)

## 2017-06-10 LAB — PHOSPHORUS: PHOSPHORUS: 5.1 mg/dL — AB (ref 2.5–4.6)

## 2017-06-10 LAB — CK: Total CK: <5 U/L — ABNORMAL LOW (ref 38–234)

## 2017-06-10 LAB — CREATININE, FLUID (PLEURAL, PERITONEAL, JP DRAINAGE): Creat, Fluid: 3.1 mg/dL

## 2017-06-10 LAB — PREALBUMIN: Prealbumin: >60 mg/dL — ABNORMAL HIGH (ref 18–38)

## 2017-06-10 LAB — MAGNESIUM: Magnesium: 1.8 mg/dL (ref 1.7–2.4)

## 2017-06-10 MED ORDER — ETOMIDATE 2 MG/ML IV SOLN
INTRAVENOUS | Status: AC
  Administered 2017-06-10: 20 mg
  Filled 2017-06-10: qty 10

## 2017-06-10 MED ORDER — SODIUM CHLORIDE 0.9 % IV SOLN
1000.0000 mg | INTRAVENOUS | Status: DC
  Administered 2017-06-11 – 2017-06-15 (×3): 1000 mg via INTRAVENOUS
  Filled 2017-06-10 (×4): qty 20

## 2017-06-10 MED ORDER — PRISMASOL BGK 4/2.5 32-4-2.5 MEQ/L IV SOLN
INTRAVENOUS | Status: DC
  Administered 2017-06-10 – 2017-06-17 (×54): via INTRAVENOUS_CENTRAL
  Filled 2017-06-10 (×72): qty 5000

## 2017-06-10 MED ORDER — PANTOPRAZOLE SODIUM 40 MG IV SOLR
40.0000 mg | Freq: Two times a day (BID) | INTRAVENOUS | Status: DC
  Administered 2017-06-11 – 2017-06-17 (×13): 40 mg via INTRAVENOUS
  Filled 2017-06-10 (×13): qty 40

## 2017-06-10 MED ORDER — DEXTROSE 5 % IV SOLN
20.0000 g | INTRAVENOUS | Status: DC
  Administered 2017-06-10 – 2017-06-16 (×7): 20 g via INTRAVENOUS_CENTRAL
  Filled 2017-06-10 (×15): qty 200

## 2017-06-10 MED ORDER — SODIUM CHLORIDE 0.9 % FOR CRRT
INTRAVENOUS_CENTRAL | Status: DC | PRN
  Filled 2017-06-10: qty 1000

## 2017-06-10 MED ORDER — PIPERACILLIN-TAZOBACTAM 3.375 G IVPB 30 MIN
3.3750 g | Freq: Four times a day (QID) | INTRAVENOUS | Status: DC
  Administered 2017-06-10 – 2017-06-17 (×28): 3.375 g via INTRAVENOUS
  Filled 2017-06-10 (×29): qty 50

## 2017-06-10 MED ORDER — MAGNESIUM SULFATE 2 GM/50ML IV SOLN
2.0000 g | Freq: Once | INTRAVENOUS | Status: AC
  Administered 2017-06-10: 2 g via INTRAVENOUS
  Filled 2017-06-10: qty 50

## 2017-06-10 MED ORDER — PIPERACILLIN-TAZOBACTAM 3.375 G IVPB: 3.3750 g | Freq: Four times a day (QID) | INTRAVENOUS | Status: DC

## 2017-06-10 MED ORDER — DEXTROSE 5 % IV SOLN
Status: DC
  Administered 2017-06-10 – 2017-06-17 (×13): via INTRAVENOUS_CENTRAL
  Filled 2017-06-10 (×26): qty 1500

## 2017-06-10 MED ORDER — PRISMASOL BGK 4/2.5 32-4-2.5 MEQ/L IV SOLN
INTRAVENOUS | Status: DC
  Administered 2017-06-10 – 2017-06-17 (×23): via INTRAVENOUS_CENTRAL
  Filled 2017-06-10 (×27): qty 5000

## 2017-06-10 MED ORDER — PIPERACILLIN-TAZOBACTAM 3.375 G IVPB 30 MIN
3.3750 g | Freq: Four times a day (QID) | INTRAVENOUS | Status: DC
  Filled 2017-06-10: qty 50

## 2017-06-10 MED ORDER — HEPARIN SODIUM (PORCINE) 1000 UNIT/ML DIALYSIS: 1000.0000 [IU] | INTRAMUSCULAR | Status: DC | PRN

## 2017-06-10 MED ORDER — TRACE MINERALS CR-CU-MN-SE-ZN 10-1000-500-60 MCG/ML IV SOLN
INTRAVENOUS | Status: AC
  Administered 2017-06-10: 17:00:00 via INTRAVENOUS
  Filled 2017-06-10: qty 1440

## 2017-06-10 NOTE — Progress Notes (Signed)
Pharmacy Antibiotic Note  [name] is a 82 y.o. female admitted on 05/22/2017 with necrotizing wound infection.  Pharmacy has been consulted for Zosyn and Cubicin dosing.  Pt now starting CRRT.  Plan: Change Zosyn to 3.375g IV Q6H. Cubicin remains appropriate.  Height: 5' 2.99" (160 cm) Weight: 283 lb (128.4 kg) IBW/kg (Calculated) : 52.38  Temp (24hrs), Avg:98 F (36.7 C), Min:97.3 F (36.3 C), Max:98.4 F (36.9 C)   Recent Labs Lab 06/05/17 1154  06/05/17 2252  06/13/2017 2326  06/07/17 0800 06/07/17 1632  06/08/17 0200  06/08/17 1055 06/08/17 1745 06/09/17 0500 06/09/17 1500 06/09/17 2220  WBC  --   < > 21.3*  < > 21.2*  --  19.9* 21.0*  --  22.8*  --   --   --  19.9*  --   --   CREATININE  --   < >  --   < >  --   < >  --  2.31*  < >  --   < > 2.62* 2.74* 2.94* 3.03* 3.14*  LATICACIDVEN 6.2*  --  4.2*  --   --   --   --   --   --   --   --   --   --   --   --   --   < > = values in this interval not displayed.  Estimated Creatinine Clearance: 18.7 mL/min (A) (by C-G formula based on SCr of 3.14 mg/dL (H)).    No Known Allergies   Thank you for allowing pharmacy to be a part of this patient's care.  Wynona Neat, PharmD, BCPS  06/10/2017 7:20 AM

## 2017-06-10 NOTE — Progress Notes (Signed)
PHARMACY - ADULT TOTAL PARENTERAL NUTRITION CONSULT NOTE   Pharmacy Consult:  TPN Indication:  Prolonged NPO status  Patient Measurements: Height: 5' 2.99" (160 cm) Weight: 283 lb (128.4 kg) IBW/kg (Calculated) : 52.38 TPN AdjBW (KG): 64 Body mass index is 50.14 kg/m.  Assessment:  27 YOF presented on 05/29/2017 with CHF exacerbation, hypotension and elevated troponins.  PMH significant for AFib, dHF, CKD3, GERD, HLD, CAD, and pulmonary HTN.  Patient has gangrenous left thigh/leg and underwent multiple I&Ds.  She required transfer to the ICU after the I&D on 06/11/2017 d/t hypotension from duodenal ulcer bleeding.  Patient underwent ex-lap with pyloroplasty and oversewing of the bleeding ulcer on the same day.  Per charting, patient was on a carb modified diet through 06/03/17.  He has been NPO x6 days with anticipated prolonged NPO status; therefore, Pharmacy consulted to manage TPN.  GI: GIB on Protonix gtt D#6/6, NG O/P 551mL, drains O/P 1243mL.  ?BL prealbumin >60 Endo: no hx DM - CBGs low-low normal prior TPN, now improving Insulin requirements in the past 24 hours: none Lytes: Phos 5.1 (Ca x Phos = 51, goal < 55), others WNL *TPN without lytes: 10/22>> Renal: CKD3 - CRRT started 10/23. SCr up 3.22 (BL SCr 0.8-1.1), BUN 63 - no UOP, net +19L since admit, D5NS at 20 ml/hr Pulm: intubated 10/17, FiO2 40% Cards: s/p code blue, MAP 60s, brady, CVP 7, in Afib - Levophed gtt, s/p Lasix x2 doses AC: Eliquis for Afib held since 9/23 d/t LLE hematoma - mild anemia, plts 46K Ortho: gangrenous left thigh/leg s/p I&D 10/5, 10/10, 10/12, 10/17 Hepatobil: LFTs improving, tbili down 1.6 (no jaundice) Neuro: PRN Fentanyl - GCS 15, RASS at goal 0 ID: Zosyn/Cubicin for infected leg wound - afebrile, WBC down to 14.8 Best Practices: CHG TPN Access: CVC placed 06/05/17 TPN start date: 06/09/17  Nutritional Goals (per RD rec on 10/19): 1610-9604 kCal and 130gm protein per day  Current Nutrition:   TPN   Plan:  Increase Clinimix 5/15 (no lytes d/t AKI) at 60 ml/hr (goal rate ~100 ml/hr) Hold 20% ILE for the first 7 days of TPN in ICU patients per ASPEN/SCCM guidelines (start date 06/16/17) Daily multivitamin and trace elements in TPN  Continue sensitive SSI Q4H Repeat mag sulfate 2gm IV x 1 F/U daily   [name] D. Mina Marble, PharmD, BCPS Pager:  [phone] 06/10/2017, 9:23 AM

## 2017-06-10 NOTE — Progress Notes (Signed)
6 Days Post-Op   Subjective/Chief Complaint: ON VENT AWAKE    Objective: Vital signs in last 24 hours: Temp:  [97.3 F (36.3 C)-97.9 F (36.6 C)] 97.8 F (36.6 C) (10/23 0745) Pulse Rate:  [54-82] 67 (10/23 1300) Resp:  [0-24] 19 (10/23 1300) BP: (73-119)/(46-82) 78/52 (10/23 1300) SpO2:  [92 %-100 %] 100 % (10/23 1300) FiO2 (%):  [40 %] 40 % (10/23 1118) Weight:  [128.4 kg (283 lb)] 128.4 kg (283 lb) (10/23 0500) Last BM Date: 06/02/2017  Intake/Output from previous day: 10/22 0701 - 10/23 0700 In: 2659 [I.V.:2082; NG/GT:30; IV Piggyback:447] Out: 1500 [Emesis/NG output:250; Drains:1250] Intake/Output this shift: Total I/O In: 523.2 [I.V.:523.2] Out: 100 [Emesis/NG output:100]  Incision/Wound:OPEN WOUND CLEAN  jp MORE BLOODY LESS BILIOUS   Lab Results:   Recent Labs  06/09/17 0500 06/10/17 0555  WBC 19.9* 14.8*  HGB 11.3* 11.7*  HCT 35.1* 37.6  PLT 48* 46*   BMET  Recent Labs  06/09/17 2220 06/10/17 0555  NA 142 142  K 3.9 3.8  CL 108 107  CO2 23 22  GLUCOSE 93 109*  BUN 61* 63*  CREATININE 3.14* 3.22*  CALCIUM 8.0* 8.0*   PT/INR  Recent Labs  06/08/17 0830 06/09/17 0500  LABPROT 17.5* 17.7*  INR 1.45 1.47   ABG  Recent Labs  06/08/17 0450  PHART 7.349*  HCO3 25.6    Studies/Results: US Renal  Result Date: 06/09/2017 CLINICAL DATA:  Acute kidney insufficiency. EXAM: RENAL / URINARY TRACT ULTRASOUND COMPLETE COMPARISON:  PET scan 04/09/2014. FINDINGS: Right Kidney: Length: 9.9 cm, within normal limits. Renal parenchyma is thinned and hyperechoic relative to the index organ, the liver. There is no mass or stone. No obstructing lesion is present. Left Kidney: Length: 9.1 cm, within normal limits. Renal parenchyma is thinned and hyperechoic relative to the index organ, the spleen. There is no mass or stone. No obstructing lesion is present. Bladder: A Foley catheter is in place. Bilateral pleural effusions are incidentally noted. IMPRESSION:  1. Thin hyperechoic kidneys bilaterally without focal mass lesion or obstructing stone. 2. The finding is nonspecific, but can be seen in the setting of medical renal disease. Electronically Signed   By: San Morelle M.D.   On: 06/09/2017 15:47   Dg Chest Port 1 View  Result Date: 06/10/2017 CLINICAL DATA:  Catheter insertion.  Hypoxia. EXAM: PORTABLE CHEST 1 VIEW COMPARISON:  Study obtained earlier in the day FINDINGS: Dialysis catheter tip is in the superior vena cava. Right jugular catheter tip is in the superior vena cava. Endotracheal tube tip is 3.6 cm above the carina. Nasogastric tube tip and side port are below the diaphragm. No pneumothorax. There are bilateral pleural effusions. There is interstitial and alveolar edema, stable. There is airspace consolidation in the right lower lobe with volume loss. There is also perihilar airspace opacity. There is cardiomegaly with pulmonary venous hypertension. No evident adenopathy. No bone lesions. There is aortic atherosclerosis. IMPRESSION: Dialysis catheter tip in superior vena cava. Other tubes and catheters as described. No pneumothorax. Cardiomegaly with pulmonary venous hypertension. Pulmonary edema and pleural effusions remain. Airspace consolidation right lower lobe and left perihilar regions raise concern for superimposed pneumonia. The appearance of the lungs and cardiac silhouette is stable compared to earlier in the day. There is aortic atherosclerosis. Aortic Atherosclerosis (ICD10-I70.0). Electronically Signed   By: Lowella Grip III M.D.   On: 06/10/2017 12:55   Dg Chest Port 1 View  Result Date: 06/10/2017 CLINICAL DATA:  Respiratory failure.  EXAM: PORTABLE CHEST 1 VIEW COMPARISON:  06/09/2017. FINDINGS: Endotracheal tube and right IJ line stable position. NG tube nodes its tip below the lower image margin. The hemidiaphragms are incompletely imaged. Cardiomegaly again noted. Pulmonary vascular congestion interstitial  prominence with bilateral pleural effusions, left side greater than right again noted. Findings suggest congestive heart failure. Dense right lower lobe atelectasis and consolidation again noted. IMPRESSION: 1.  Lines and tubes in stable position. 2. Findings suggesting congestive heart failure with pulmonary pulmonary edema bilateral pleural effusions, left side greater than right. Findings have progressed from prior exam. 3. Persistent dense right lower lobe atelectasis and consolidation. Electronically Signed   By: Marcello Moores  Register   On: 06/10/2017 07:00   Dg Chest Port 1 View  Result Date: 06/09/2017 CLINICAL DATA:  82 year old female with acute respiratory failure. EXAM: PORTABLE CHEST 1 VIEW COMPARISON:  06/08/2017. FINDINGS: Endotracheal tube tip 3 cm above the carina with slight angulation of the trachea to the right unchanged. Right central line tip proximal superior vena cava level. Nasogastric tube courses below the diaphragm. Tip is not included on the present exam. Cardiomegaly. Pulmonary vascular congestion/ mild pulmonary edema with bilateral pleural effusions suspected. Bibasilar atelectasis.  Basilar infiltrates secondary consideration. No pneumothorax. IMPRESSION: Overall no significant change. Pulmonary edema with bilateral pleural effusions. Cardiomegaly. Bibasilar atelectasis suspected. Basilar infiltrates secondary consideration. Electronically Signed   By: Genia Del M.D.   On: 06/09/2017 07:13    Anti-infectives: Anti-infectives    Start     Dose/Rate Route Frequency Ordered Stop   06/11/17 1515  DAPTOmycin (CUBICIN) 1,000 mg in sodium chloride 0.9 % IVPB     1,000 mg 240 mL/hr over 30 Minutes Intravenous Every 48 hours 06/10/17 0734     06/10/17 1500  piperacillin-tazobactam (ZOSYN) IVPB 3.375 g     3.375 g 100 mL/hr over 30 Minutes Intravenous Every 6 hours 06/10/17 1017     06/10/17 1200  piperacillin-tazobactam (ZOSYN) IVPB 3.375 g  Status:  Discontinued     3.375  g 12.5 mL/hr over 240 Minutes Intravenous Every 6 hours 06/10/17 0718 06/10/17 0719   06/10/17 1200  piperacillin-tazobactam (ZOSYN) IVPB 3.375 g  Status:  Discontinued     3.375 g 100 mL/hr over 30 Minutes Intravenous Every 6 hours 06/10/17 0720 06/10/17 1017   06/09/17 2300  piperacillin-tazobactam (ZOSYN) IVPB 2.25 g  Status:  Discontinued     2.25 g 100 mL/hr over 30 Minutes Intravenous Every 8 hours 06/09/17 1729 06/10/17 0714   06/09/17 2200  piperacillin-tazobactam (ZOSYN) IVPB 3.375 g  Status:  Discontinued     3.375 g 12.5 mL/hr over 240 Minutes Intravenous Every 8 hours 06/09/17 1515 06/09/17 1634   06/09/17 1645  piperacillin-tazobactam (ZOSYN) IVPB 2.25 g  Status:  Discontinued     2.25 g 100 mL/hr over 30 Minutes Intravenous Every 8 hours 06/09/17 1634 06/09/17 1729   06/09/17 1515  DAPTOmycin (CUBICIN) 750 mg in sodium chloride 0.9 % IVPB  Status:  Discontinued     750 mg 230 mL/hr over 30 Minutes Intravenous Every 48 hours 06/09/17 1500 06/10/17 0734   06/02/2017 0930  ceFAZolin (ANCEF) IVPB 2g/100 mL premix     2 g 200 mL/hr over 30 Minutes Intravenous On call to O.R. 06/03/17 1214 06/09/2017 1006   06/02/17 2200  linezolid (ZYVOX) IVPB 600 mg  Status:  Discontinued    Comments:  Pharmacy may adjust dose prn   600 mg 300 mL/hr over 60 Minutes Intravenous Every 12 hours 06/02/17 1546  06/08/17 0915   06/02/17 1545  linezolid (ZYVOX) IVPB 600 mg  Status:  Discontinued     600 mg 300 mL/hr over 60 Minutes Intravenous Every 12 hours 06/02/17 1543 06/02/17 1546   05/31/17 1500  vancomycin (VANCOCIN) 1,250 mg in sodium chloride 0.9 % 250 mL IVPB  Status:  Discontinued     1,250 mg 166.7 mL/hr over 90 Minutes Intravenous Every 24 hours 05/26/2017 1409 06/03/17 0845   06/05/2017 2000  piperacillin-tazobactam (ZOSYN) IVPB 3.375 g  Status:  Discontinued     3.375 g 12.5 mL/hr over 240 Minutes Intravenous Every 8 hours 05/27/2017 1409 06/09/17 1515   05/28/2017 1300  vancomycin (VANCOCIN)  2,000 mg in sodium chloride 0.9 % 500 mL IVPB     2,000 mg 250 mL/hr over 120 Minutes Intravenous  Once 06/16/2017 1208 05/21/2017 1657   05/23/2017 1300  piperacillin-tazobactam (ZOSYN) IVPB 3.375 g     3.375 g 100 mL/hr over 30 Minutes Intravenous  Once 05/31/2017 1208 05/29/2017 1334   06/03/2017 0745  ceFAZolin (ANCEF) IVPB 2g/100 mL premix  Status:  Discontinued     2 g 200 mL/hr over 30 Minutes Intravenous On call to O.R. 06/02/2017 0736 05/27/2017 1143   06/12/2017 1800  ceFAZolin (ANCEF) IVPB 2g/100 mL premix  Status:  Discontinued     2 g 200 mL/hr over 30 Minutes Intravenous Every 8 hours 05/26/2017 1220 05/19/2017 1210   06/03/2017 0945  ceFAZolin (ANCEF) IVPB 2g/100 mL premix     2 g 200 mL/hr over 30 Minutes Intravenous To Surgery 05/27/17 2001 06/04/2017 1015   06/14/2017 2000  ceFAZolin (ANCEF) IVPB 2g/100 mL premix     2 g 200 mL/hr over 30 Minutes Intravenous Every 6 hours 06/16/2017 1643 05/24/17 1009   05/28/2017 1015  ceFAZolin (ANCEF) IVPB 2g/100 mL premix     2 g 200 mL/hr over 30 Minutes Intravenous On call to O.R. 06/07/2017 1014 06/16/2017 1400      Assessment/Plan: s/p Procedure(s): EXPLORATORY LAPAROTOMY REPAIR OF DUODENAL ULCER (N/A)  LOS: 22 days  UGI when stable   TNA Wound care   Gurpreet Mikhail A. 06/10/2017

## 2017-06-10 NOTE — Progress Notes (Signed)
S: Family at bedside, patient is intubated. Orthopedics is planning of BKA.   O:BP 98/61   Pulse 81   Temp 97.8 F (36.6 C) (Oral)   Resp (!) 24   Ht 5' 2.99" (1.6 m)   Wt 283 lb (128.4 kg)   SpO2 97%   BMI 50.14 kg/m   Intake/Output Summary (Last 24 hours) at 06/10/17 1114 Last data filed at 06/10/17 0700  Gross per 24 hour  Intake          2294.89 ml  Output             1500 ml  Net           794.89 ml   Intake/Output: I/O last 3 completed shifts: In: 3106.6 [I.V.:2419.6; Other:110; NG/GT:30; IV Piggyback:547] Out: 1925 [Emesis/NG output:550; Drains:1375]  Intake/Output this shift:  No intake/output data recorded. Weight change: 0.9 oz (0.025 kg) Gen: no acute distress, less interactive than yesterday  CVS: RRR Gr2/6 M Resp: lungs sound clear over anterior fields Rales in bases, decreased bs Abd: soft, non tender, non distended. Drain in place draining serosanguinous fluid Drain on R, no bs.  Ext: 3+ bilateral lower extremity pitting edema, left leg with dressings clean dry and intact   Recent Labs Lab 06/05/17 0413  06/08/17 0550 06/08/17 1055 06/08/17 1745 06/09/17 0500 06/09/17 1500 06/09/17 2220 06/10/17 0555  NA 138  < > 139 140 141 141 141 142 142  K 5.0  < > 3.8 3.8 4.0 3.9 3.9 3.9 3.8  CL 107  < > 107 107 107 109 107 108 107  CO2 16*  < > '26 26 25 25 24 23 22  ' GLUCOSE 202*  < > 87 90 86 77 84 93 109*  BUN 26*  < > 52* 52* 55* 56* 59* 61* 63*  CREATININE 1.36*  < > 2.59* 2.62* 2.74* 2.94* 3.03* 3.14* 3.22*  ALBUMIN 1.6*  --   --   --   --  1.4*  --   --  1.4*  CALCIUM 8.0*  < > 7.6* 7.7* 7.8* 7.7* 8.1* 8.0* 8.0*  PHOS  --   --  4.2  --   --  4.8*  --   --  5.1*  AST 2,448*  --   --   --   --  121*  --   --  49*  ALT 389*  --   --   --   --  19  --   --  11*  < > = values in this interval not displayed. Liver Function Tests:  Recent Labs Lab 06/05/17 0413 06/09/17 0500 06/10/17 0555  AST 2,448* 121* 49*  ALT 389* 19 11*  ALKPHOS 92 130* 139*   BILITOT 3.5* 1.6* 1.6*  PROT 3.3* 4.2* 4.2*  ALBUMIN 1.6* 1.4* 1.4*   No results for input(s): LIPASE, AMYLASE in the last 168 hours. No results for input(s): AMMONIA in the last 168 hours. CBC:  Recent Labs Lab 06/07/17 0800 06/07/17 1632 06/08/17 0200 06/09/17 0500 06/10/17 0555  WBC 19.9* 21.0* 22.8* 19.9* 14.8*  NEUTROABS  --   --   --   --  13.4*  HGB 12.8 11.2* 11.5* 11.3* 11.7*  HCT 36.8 33.5* 34.4* 35.1* 37.6  MCV 87.0 89.3 90.5 92.1 94.0  PLT 46* 49* 41* 48* 46*   Cardiac Enzymes:  Recent Labs Lab 06/09/2017 1520 06/05/17 1208 06/10/17 0555  CKTOTAL  --   --  <5*  TROPONINI 0.04* 0.38*  --  CBG:  Recent Labs Lab 06/09/17 1203 06/09/17 1549 06/09/17 1652 06/09/17 2031 06/10/17 0755  GLUCAP 70 65 123* 81 112*    Iron Studies: No results for input(s): IRON, TIBC, TRANSFERRIN, FERRITIN in the last 72 hours. Studies/Results: US Renal  Result Date: 06/09/2017 CLINICAL DATA:  Acute kidney insufficiency. EXAM: RENAL / URINARY TRACT ULTRASOUND COMPLETE COMPARISON:  PET scan 04/09/2014. FINDINGS: Right Kidney: Length: 9.9 cm, within normal limits. Renal parenchyma is thinned and hyperechoic relative to the index organ, the liver. There is no mass or stone. No obstructing lesion is present. Left Kidney: Length: 9.1 cm, within normal limits. Renal parenchyma is thinned and hyperechoic relative to the index organ, the spleen. There is no mass or stone. No obstructing lesion is present. Bladder: A Foley catheter is in place. Bilateral pleural effusions are incidentally noted. IMPRESSION: 1. Thin hyperechoic kidneys bilaterally without focal mass lesion or obstructing stone. 2. The finding is nonspecific, but can be seen in the setting of medical renal disease. Electronically Signed   By: San Morelle M.D.   On: 06/09/2017 15:47   Dg Chest Port 1 View  Result Date: 06/10/2017 CLINICAL DATA:  Respiratory failure. EXAM: PORTABLE CHEST 1 VIEW COMPARISON:   06/09/2017. FINDINGS: Endotracheal tube and right IJ line stable position. NG tube nodes its tip below the lower image margin. The hemidiaphragms are incompletely imaged. Cardiomegaly again noted. Pulmonary vascular congestion interstitial prominence with bilateral pleural effusions, left side greater than right again noted. Findings suggest congestive heart failure. Dense right lower lobe atelectasis and consolidation again noted. IMPRESSION: 1.  Lines and tubes in stable position. 2. Findings suggesting congestive heart failure with pulmonary pulmonary edema bilateral pleural effusions, left side greater than right. Findings have progressed from prior exam. 3. Persistent dense right lower lobe atelectasis and consolidation. Electronically Signed   By: Marcello Moores  Register   On: 06/10/2017 07:00   Dg Chest Port 1 View  Result Date: 06/09/2017 CLINICAL DATA:  82 year old female with acute respiratory failure. EXAM: PORTABLE CHEST 1 VIEW COMPARISON:  06/08/2017. FINDINGS: Endotracheal tube tip 3 cm above the carina with slight angulation of the trachea to the right unchanged. Right central line tip proximal superior vena cava level. Nasogastric tube courses below the diaphragm. Tip is not included on the present exam. Cardiomegaly. Pulmonary vascular congestion/ mild pulmonary edema with bilateral pleural effusions suspected. Bibasilar atelectasis.  Basilar infiltrates secondary consideration. No pneumothorax. IMPRESSION: Overall no significant change. Pulmonary edema with bilateral pleural effusions. Cardiomegaly. Bibasilar atelectasis suspected. Basilar infiltrates secondary consideration. Electronically Signed   By: Genia Del M.D.   On: 06/09/2017 07:13   . brimonidine  1 drop Right Eye BID  . brinzolamide  1 drop Both Eyes BID  . chlorhexidine gluconate (MEDLINE KIT)  15 mL Mouth Rinse BID  . Chlorhexidine Gluconate Cloth  6 each Topical Daily  . docusate sodium  100 mg Oral BID  . etomidate      .  Gerhardt's butt cream   Topical BID  . insulin aspart  0-9 Units Subcutaneous Q4H  . latanoprost  1 drop Right Eye QHS  . mouth rinse  15 mL Mouth Rinse QID  . sodium chloride flush  3 mL Intravenous Q12H  . timolol  1 drop Both Eyes BID    BMET    Component Value Date/Time   NA 142 06/10/2017 0555   K 3.8 06/10/2017 0555   CL 107 06/10/2017 0555   CO2 22 06/10/2017 0555   GLUCOSE 109 (H)  06/10/2017 0555   BUN 63 (H) 06/10/2017 0555   CREATININE 3.22 (H) 06/10/2017 0555   CREATININE 1.10 (H) 01/31/2016 1118   CALCIUM 8.0 (L) 06/10/2017 0555   GFRNONAA 13 (L) 06/10/2017 0555   GFRAA 15 (L) 06/10/2017 0555   CBC    Component Value Date/Time   WBC 14.8 (H) 06/10/2017 0555   RBC 4.00 06/10/2017 0555   HGB 11.7 (L) 06/10/2017 0555   HCT 37.6 06/10/2017 0555   PLT 46 (L) 06/10/2017 0555   MCV 94.0 06/10/2017 0555   MCH 29.3 06/10/2017 0555   MCHC 31.1 06/10/2017 0555   RDW 16.7 (H) 06/10/2017 0555   LYMPHSABS 0.8 06/10/2017 0555   MONOABS 0.5 06/10/2017 0555   EOSABS 0.1 06/10/2017 0555   BASOSABS 0.0 06/10/2017 0555     Assessment/Plan:  1. AKI on CKD III (baseline crt 0.8) likely secondary to hypovolemia/hypotension with acute hemorrhagic schock and acute ischemic tubular necrosis. No increase in urine output with trial of lasix. She is still without hyperkalemia or anion gap but BUN continues to rise. She is on TPN now and will be receiving large amounts of fluids, continues to accumulate volume extravascular so will plan to initiate CRRT today.   1. Plan to initiate HD today  2. Ordered urine na and osmolality, urinalysis with microscopy, have not been able to collect enough urine yet for these  3. Will check Cr on drain fluid.  As rate of rise of Cr not typical of anuric ARF 2. Acute GI bleed s/p ex lap with repair - trending hgb  3. Hypotension - on levophed  3. Shock liver - transaminase improving 4. Thrombocytopenia -stable,  thinking this is related to linezolid  and shock liver  5. Paroxysmal atrial fibrillation - holding anticoagulation for GIB  6. Necrotizing fascitis with multiple species on culture - on zosyn  7. Pulm HTN - on the ventilator, PCCM attempting SBT  8 Obesity 10 CAD  Ledell Noss, MD  I have seen and examined this patient and agree with the plan of care seen, eval, examined, discussed with patient, family, primary SVC, and resident. Will start CRRT to avoid more vol xs.  .  Jonte Shiller L 06/10/2017, 11:55 AM

## 2017-06-10 NOTE — Progress Notes (Signed)
Pharmacy Antibiotic Note  [name] is a 82 y.o. female with a PMH of CKD III and a recent hospitalization for a traumatic left leg hematoma was readmitted on 05/24/2017 with a necrotizing LLE wound infection.  Had several I&Ds and then became hemodynamically unstable on 10/17 due to a large cratered ulcer in the duodenal bulb which required a massive blood transfusion after bursting during an endoscopy. Is s/p exploratory laperotomy. Wound cx abundantly grew several species, most notably pseudomonas and VRE. Was being maintained on zyvox and zosyn, but due to worsening thrombocytopenia, zyvox was discontinued on 10/21. Pharmacy has been consulted for daptomycin and zosyn dosing. Daptomycin approved by ID (Dr. Baxter Flattery) to cover VRE.  Today, WBC are trending down and she remains afebrile. SCr continues to increase and she was started on CRRT at 1323 on 10/23. She has not made any urine since 10/18. Baseline CK level <5.  She remains intubated, on levophed, and on TNA. Per RN, wound appears very necrosed and current plan is to amputate limb once she is more stable.   Plan: Zosyn 3.375 g IV q6h Daptomycin 1000 mg IV q48h (8 mg/kg) CPK q72h Follow renal function, clinical course and CRRT plans  Height: 5' 2.99" (160 cm) Weight: 283 lb (128.4 kg) IBW/kg (Calculated) : 52.38  Temp (24hrs), Avg:97.8 F (36.6 C), Min:97.3 F (36.3 C), Max:98.2 F (36.8 C)   Recent Labs Lab 06/05/17 1154  06/05/17 2252  06/07/17 0800 06/07/17 1632  06/08/17 0200  06/08/17 1745 06/09/17 0500 06/09/17 1500 06/09/17 2220 06/10/17 0555  WBC  --   < > 21.3*  < > 19.9* 21.0*  --  22.8*  --   --  19.9*  --   --  14.8*  CREATININE  --   < >  --   < >  --  2.31*  < >  --   < > 2.74* 2.94* 3.03* 3.14* 3.22*  LATICACIDVEN 6.2*  --  4.2*  --   --   --   --   --   --   --   --   --   --   --   < > = values in this interval not displayed.  Estimated Creatinine Clearance: 18.2 mL/min (A) (by C-G formula based  on SCr of 3.22 mg/dL (H)).    No Known Allergies  Antimicrobials this admission: Vanc 10/12 >> 10/15 Linezolid 10/15 >> 10/21 Zosyn 10/12 >> Daptomycin 10/22 >>  Dose adjustments this admission: CRRT: Increased zosyn to q6h; Increased daptomycin to 1000 mg.   Microbiology results: Wound Cx: E coli, Pseudomonas, Morganella morganii, Proteus mirabilis, VRE (sensitive to linezolid)  10/2 MRSA PCR: neg  Thank you for allowing pharmacy to be a part of this patient's care.  Sallyanne Havers, PharmD Candidate 06/10/2017 11:45 AM  I discussed / reviewed the pharmacy note by Ms. Dunn, PharmD Candidate and I agree with the student's findings and plans as documented. Currently using TBW to dose, but with extreme of obesity, may consider using ABW going forward. Will reassess clinical status and renal function. We will watch CK closely with q72 hour monitoring.   Sloan Leiter, PharmD, BCPS Clinical Pharmacist Clinical phone 06/10/2017 until 3:[phone] After hours, please call [phone] 06/10/2017, 2:32 PM

## 2017-06-10 NOTE — Progress Notes (Signed)
PULMONARY / CRITICAL CARE MEDICINE   Name: [name] MRN: [id number] DOB: 25-Mar-1935    ADMISSION DATE:  05/21/2017 CONSULTATION DATE:  05/25/2017  REFERRING MD:  Dr. Sherral Hammers  CHIEF COMPLAINT:  Acute hypotension  HISTORY OF PRESENT ILLNESS:   82 year old female with history of HFpEF, necrotizing fasciitis of left lower leg, HLD, tricuspid regurg, pulmonary hypertension, CKD III, COPD on 2.5L at night presents with hypotension requiring pressor support after repeat left leg wound debridement earlier today (10/17). Patient was in her usual state of health until 9/22 when she presented for traumatic left leg hematoma and hypovolemic shock. She was admitted to the hospital 05/10/17-05/16/2017 and subsequently discharged to SNF where she experienced worsening weakness. She was then readmitted with acute on chronic HFpEF exacerbation on 10/1.  During this hospital stay patient continued to have hemodynamic instability requiring 8 units PRBC over her hospital course. She has been taken back to the OR  On 10/5, 10/10, 10/12, and 10/17 for further wound debridement. Today, after her procedure was completed and wound vac in place, she was in PACU where her blood pressure was noted to drop to 24P systolic. She was given albumin. Neo was initiated. Patient indicated she was dizzy and lightheaded. 3u PRBC were administered. PCCM was called for consult.  Of note patient had grossly bloody stool in the PACU with her hypotensive episode. Bowel movement was described as formed but grossly bloody/in a pool of blood. She denies prior episodes of melena or hematochezia. She endorses being constipated over past few days.   SUBJECTIVE: No response to 80 lasix 10/20 and 160 lasix 10/21.  Awake and follows all commands. Hypoglycemic overnight, improved with D50. Continues to be on levophed 13 this AM.   VITAL SIGNS: BP (!) 88/64   Pulse 67   Temp 97.9 F (36.6 C)   Resp (!) 7   Ht 5' 2.99" (1.6 m)   Wt 283  lb (128.4 kg)   SpO2 100%   BMI 50.14 kg/m   HEMODYNAMICS: CVP:  [5 mmHg-7 mmHg] 5 mmHg  VENTILATOR SETTINGS: Vent Mode: PRVC FiO2 (%):  [40 %-100 %] 40 % Set Rate:  [16 bmp] 16 bmp Vt Set:  [450 mL] 450 mL PEEP:  [5 cmH20] 5 cmH20 Pressure Support:  [5 cmH20] 5 cmH20 Plateau Pressure:  [13 cmH20-20 cmH20] 17 cmH20  INTAKE / OUTPUT: I/O last 3 completed shifts: In: 3019.4 [I.V.:2332.4; Other:110; NG/GT:30; IV [id number]:505] Out: 1925 [Emesis/NG output:550; Drains:1375]  PHYSICAL EXAMINATION: General:  Adult female, in NAD Neuro:  [place], follows commands Cardiovascular:  Irregularly irregular, no m/r/g Lungs:  MV, CTA anteriorly, no W/R/R Abdomen:  Surgical dressing is clean and dry. Abdomen mildly distended and appropriately tender without rebound or guarding. JP drain with dark fluid. Musculoskeletal: left wound vac, leg wrapped, bandage clean and dry Skin:  No rashes or lesions  LABS:  BMET  Recent Labs Lab 06/09/17 0500 06/09/17 1500 06/09/17 2220  NA 141 141 142  K 3.9 3.9 3.9  CL 109 107 108  CO2 25 24 23   BUN 56* 59* 61*  CREATININE 2.94* 3.03* 3.14*  GLUCOSE 77 84 93    Electrolytes  Recent Labs Lab 06/08/2017 0356  06/08/17 0550  06/09/17 0500 06/09/17 1500 06/09/17 2220  CALCIUM 8.5*  < > 7.6*  < > 7.7* 8.1* 8.0*  MG 2.0  --  1.2*  --  1.5*  --   --   PHOS  --   --  4.2  --  4.8*  --   --   < > = values in this interval not displayed.  CBC  Recent Labs Lab 06/07/17 1632 06/08/17 0200 06/09/17 0500  WBC 21.0* 22.8* 19.9*  HGB 11.2* 11.5* 11.3*  HCT 33.5* 34.4* 35.1*  PLT 49* 41* 48*    Coag's  Recent Labs Lab 06/05/17 1225 06/05/17 2303 05/20/2017 0719 06/07/17 0500 06/08/17 0830 06/09/17 0500  APTT 36 34 33  --   --   --   INR 2.44 1.55 1.51 1.59 1.45 1.47    Sepsis Markers  Recent Labs Lab 06/05/17 1154 06/05/17 2252  LATICACIDVEN 6.2* 4.2*    ABG  Recent Labs Lab 06/07/17 0450 06/07/17 0845 06/08/17 0450   PHART 7.493* 7.405 7.349*  PCO2ART 33.4 42.2 47.7  PO2ART 81.5* 75.0* 92.1    Liver Enzymes  Recent Labs Lab 06/05/17 0413 06/09/17 0500  AST 2,448* 121*  ALT 389* 19  ALKPHOS 92 130*  BILITOT 3.5* 1.6*  ALBUMIN 1.6* 1.4*    Cardiac Enzymes  Recent Labs Lab 06/13/2017 1520 06/05/17 1208  TROPONINI 0.04* 0.38*    Glucose  Recent Labs Lab 06/09/17 0400 06/09/17 0811 06/09/17 1203 06/09/17 1549 06/09/17 1652 06/09/17 2031  GLUCAP 74 70 70 65 123* 81    Imaging US Renal  Result Date: 06/09/2017 CLINICAL DATA:  Acute kidney insufficiency. EXAM: RENAL / URINARY TRACT ULTRASOUND COMPLETE COMPARISON:  PET scan 04/09/2014. FINDINGS: Right Kidney: Length: 9.9 cm, within normal limits. Renal parenchyma is thinned and hyperechoic relative to the index organ, the liver. There is no mass or stone. No obstructing lesion is present. Left Kidney: Length: 9.1 cm, within normal limits. Renal parenchyma is thinned and hyperechoic relative to the index organ, the spleen. There is no mass or stone. No obstructing lesion is present. Bladder: A Foley catheter is in place. Bilateral pleural effusions are incidentally noted. IMPRESSION: 1. Thin hyperechoic kidneys bilaterally without focal mass lesion or obstructing stone. 2. The finding is nonspecific, but can be seen in the setting of medical renal disease. Electronically Signed   By: San Morelle M.D.   On: 06/09/2017 15:47   Dg Chest Port 1 View  Result Date: 06/10/2017 CLINICAL DATA:  Respiratory failure. EXAM: PORTABLE CHEST 1 VIEW COMPARISON:  06/09/2017. FINDINGS: Endotracheal tube and right IJ line stable position. NG tube nodes its tip below the lower image margin. The hemidiaphragms are incompletely imaged. Cardiomegaly again noted. Pulmonary vascular congestion interstitial prominence with bilateral pleural effusions, left side greater than right again noted. Findings suggest congestive heart failure. Dense right lower lobe  atelectasis and consolidation again noted. IMPRESSION: 1.  Lines and tubes in stable position. 2. Findings suggesting congestive heart failure with pulmonary pulmonary edema bilateral pleural effusions, left side greater than right. Findings have progressed from prior exam. 3. Persistent dense right lower lobe atelectasis and consolidation. Electronically Signed   By: [place]   On: 06/10/2017 07:00     STUDIES:  Renal US 10/22 >   CULTURES: Surgical wound culture 10/10 >> E coli, Pseudomonas, Morganella morganii, proteus, VRE  ANTIBIOTICS: 10/10 Ancef >> 10/12 10/15 Linezolid >> 10/21 10/12 Zosyn >>10/23 Daptomycin 10/22 >>   SIGNIFICANT EVENTS: 10/5 Left thigh and left leg debridement 10/10 Repeat irrigation and debridement left thigh and left leg 10/12 Debridement left leg and thigh 10/17 Debridement left thigh wound 10/17 EGD, exploratory laparotomy with bleeding duodenal ulcer/perforated duodenum. Intubated, maxed on 4 pressors in ICU.  LINES/TUBES: PIV x3 Foley catheter Negative pressure wound  L leg Closed system drain RLQ bulb CVC triple lumen NG/OG  DISCUSSION: 82 year old female with history of HFpEF, necrotizing fasciitis of left lower leg, HLD, tricuspid regurg, pulmonary hypertension, CKD III, COPD on 2L at night, presents after repeat left lower extremity debridement with acute hypotension. Subsequently found to have duodenal ulcer with perforation, in hemorrhagic shock. Maxed on 4 pressors in ICU, intubated.   ASSESSMENT / PLAN:  GASTROINTESTINAL A:   Acute GI bleed 2/2 duodenal ulcer with hemorrhagic shock - s/p ex lap with repair 10/17 Shock liver; ALT 389, AST 2448 Nutrition P:   Surgery, GI following May have UGI series 10/23 PPI gtt Follow coags, CBC Start TNA per pharm  PULMONARY A: Pulmonary Hypertension - RHC 11/2015 with PAH 59/17 mmHg Mechanically ventilated P:   Holding home lasix, letaris SBT  Bronchial hygiene CXR  intermittently  CARDIOVASCULAR A:  HFpEF (Last echo 05/21/2017 indicates EF 60-65%, moderate MR, mod pulm HTN) with intermittent episodes of hypotension. Hypovolemic shock requiring massive blood transfusion Paroxysmal Atrial fibrillation CAD Elevated troponin P:  goal MAP > 65; currently levophed at 13 No anticoagulation due to GIB Follow CVP Monitor intake/output  RENAL A:   AKI on CKD stage III (baseline Cr 0.8-1.1), worsening.  Continues to have anuria Hypomagnesemia P:   Assess renal US Nephrology following. No UOP. Will follow up note from today. Monitor electrolytes Follow BMP q12hrs given anuria (watch for hyperK)  HEMATOLOGIC A:   Hemorrhagic shock requiring massive blood transfusions  Acute blood loss anemia - due to above P:  Transfuse for Hgb < 8 CBC in AM  INFECTIOUS A:   Surgical wound culture 10/10 >> E coli, Pseudomonas, Morganella morganii, proteus, VRE Massive wound necrosis of LLE, will require amputation P:   S/p zosyn and linezolid >> daptomycin started 10/22 Follow wound cultures Monitor fever curve, leukocytosis  Will need surgical intervention by ortho for necrotic left leg  ENDOCRINE A:   Hypoglycemia - 67 overnight, received D50 with improvement to 123 P:   Continue D5NS  NEUROLOGIC A:   Remains neurologically intact, no acute process  P:   Monitor mental status  FAMILY  - Inter-disciplinary family meet or Palliative Care meeting due by:  06/11/2017.  CC time: 35 min.  Everrett Coombe, MD PGY-2 [facility]  STAFF NOTE: I, Merrie Roof, MD FACP have personally reviewed patient's available data, including medical history, events of note, physical examination and test results as part of my evaluation. I have discussed with resident/NP and other care providers such as pharmacist, RN and RRT. In addition, I personally evaluated patient and elicited key findings of: awake, fc, jvd up again, ett, crackles, and  reduced bases, 4 plus edema, limited output to lasix, pcxr which I reviewed shows worsening edema, effusions, ett , rotated, rise in crt and overload pos again =- for HD / cvvhd, keep same MV, weaning okay cpap 5 ps5, goal 2 hours, I am reluctant to extubate her with need PS 10 and volume status, will place HD cath and start volume removal, levophed to map goals 60, cvp accuracy?, TPN on board, seems may need amputation , would delay this for now, dapto for enterococcus VRE, zosyn for other organisms, dose adjusting dapto, plat noted, no tx needed, I updated family in full The patient is critically ill with multiple organ systems failure and requires high complexity decision making for assessment and support, frequent evaluation and titration of therapies, application of advanced monitoring technologies and  extensive interpretation of multiple databases.   Critical Care Time devoted to patient care services described in this note is 35 Minutes. This time reflects time of care of this signee: Merrie Roof, MD FACP. This critical care time does not reflect procedure time, or teaching time or supervisory time of PA/NP/Med student/Med Resident etc but could involve care discussion time. Rest per NP/medical resident whose note is outlined above and that I agree with   Lavon Paganini. Titus Mould, MD, [place] Pgr: [facility] 06/10/2017 10:08 AM

## 2017-06-11 LAB — POCT I-STAT EG7
ACID-BASE DEFICIT: 2 mmol/L (ref 0.0–2.0)
ACID-BASE DEFICIT: 1 mmol/L (ref 0.0–2.0)
ACID-BASE DEFICIT: 1 mmol/L (ref 0.0–2.0)
Acid-base deficit: 1 mmol/L (ref 0.0–2.0)
Acid-base deficit: 1 mmol/L (ref 0.0–2.0)
Acid-base deficit: 1 mmol/L (ref 0.0–2.0)
Acid-base deficit: 1 mmol/L (ref 0.0–2.0)
Acid-base deficit: 1 mmol/L (ref 0.0–2.0)
Acid-base deficit: 1 mmol/L (ref 0.0–2.0)
Acid-base deficit: 2 mmol/L (ref 0.0–2.0)
Acid-base deficit: 3 mmol/L — ABNORMAL HIGH (ref 0.0–2.0)
Acid-base deficit: 4 mmol/L — ABNORMAL HIGH (ref 0.0–2.0)
BICARBONATE: 24.9 mmol/L (ref 20.0–28.0)
BICARBONATE: 26.5 mmol/L (ref 20.0–28.0)
BICARBONATE: 26.7 mmol/L (ref 20.0–28.0)
BICARBONATE: 27.3 mmol/L (ref 20.0–28.0)
BICARBONATE: 27.9 mmol/L (ref 20.0–28.0)
BICARBONATE: 28 mmol/L (ref 20.0–28.0)
BICARBONATE: 28.8 mmol/L — AB (ref 20.0–28.0)
BICARBONATE: 29.3 mmol/L — AB (ref 20.0–28.0)
Bicarbonate: 25.6 mmol/L (ref 20.0–28.0)
Bicarbonate: 26.2 mmol/L (ref 20.0–28.0)
Bicarbonate: 27.4 mmol/L (ref 20.0–28.0)
Bicarbonate: 28.2 mmol/L — ABNORMAL HIGH (ref 20.0–28.0)
Bicarbonate: 28.2 mmol/L — ABNORMAL HIGH (ref 20.0–28.0)
CALCIUM ION: 0.48 mmol/L — AB (ref 1.15–1.40)
CALCIUM ION: 0.55 mmol/L — AB (ref 1.15–1.40)
CALCIUM ION: 0.62 mmol/L — AB (ref 1.15–1.40)
CALCIUM ION: 0.96 mmol/L — AB (ref 1.15–1.40)
CALCIUM ION: 1.09 mmol/L — AB (ref 1.15–1.40)
CALCIUM ION: 1.11 mmol/L — AB (ref 1.15–1.40)
Calcium, Ion: 0.47 mmol/L — CL (ref 1.15–1.40)
Calcium, Ion: 0.53 mmol/L — CL (ref 1.15–1.40)
Calcium, Ion: 0.54 mmol/L — CL (ref 1.15–1.40)
Calcium, Ion: 0.55 mmol/L — CL (ref 1.15–1.40)
Calcium, Ion: 1.08 mmol/L — ABNORMAL LOW (ref 1.15–1.40)
Calcium, Ion: 1.09 mmol/L — ABNORMAL LOW (ref 1.15–1.40)
Calcium, Ion: 1.09 mmol/L — ABNORMAL LOW (ref 1.15–1.40)
HCT: 32 % — ABNORMAL LOW (ref 36.0–46.0)
HCT: 33 % — ABNORMAL LOW (ref 36.0–46.0)
HCT: 33 % — ABNORMAL LOW (ref 36.0–46.0)
HCT: 36 % (ref 36.0–46.0)
HCT: 38 % (ref 36.0–46.0)
HCT: 39 % (ref 36.0–46.0)
HCT: 40 % (ref 36.0–46.0)
HEMATOCRIT: 32 % — AB (ref 36.0–46.0)
HEMATOCRIT: 38 % (ref 36.0–46.0)
HEMATOCRIT: 36 % (ref 36.0–46.0)
HEMATOCRIT: 38 % (ref 36.0–46.0)
HEMATOCRIT: 34 % — AB (ref 36.0–46.0)
HEMATOCRIT: 32 % — AB (ref 36.0–46.0)
HEMOGLOBIN: 10.9 g/dL — AB (ref 12.0–15.0)
HEMOGLOBIN: 10.9 g/dL — AB (ref 12.0–15.0)
HEMOGLOBIN: 11.6 g/dL — AB (ref 12.0–15.0)
HEMOGLOBIN: 12.2 g/dL (ref 12.0–15.0)
HEMOGLOBIN: 12.2 g/dL (ref 12.0–15.0)
HEMOGLOBIN: 12.9 g/dL (ref 12.0–15.0)
HEMOGLOBIN: 13.3 g/dL (ref 12.0–15.0)
Hemoglobin: 12.9 g/dL (ref 12.0–15.0)
Hemoglobin: 11.2 g/dL — ABNORMAL LOW (ref 12.0–15.0)
Hemoglobin: 11.2 g/dL — ABNORMAL LOW (ref 12.0–15.0)
Hemoglobin: 12.9 g/dL (ref 12.0–15.0)
Hemoglobin: 10.9 g/dL — ABNORMAL LOW (ref 12.0–15.0)
Hemoglobin: 13.6 g/dL (ref 12.0–15.0)
O2 SAT: 54 %
O2 SAT: 55 %
O2 SAT: 56 %
O2 SAT: 57 %
O2 SAT: 57 %
O2 Saturation: 42 %
O2 Saturation: 46 %
O2 Saturation: 52 %
O2 Saturation: 56 %
O2 Saturation: 57 %
O2 Saturation: 58 %
O2 Saturation: 59 %
O2 Saturation: 64 %
PCO2 VEN: 53 mmHg (ref 44.0–60.0)
PCO2 VEN: 53.8 mmHg (ref 44.0–60.0)
PCO2 VEN: 54 mmHg (ref 44.0–60.0)
PCO2 VEN: 56.5 mmHg (ref 44.0–60.0)
PCO2 VEN: 57.1 mmHg (ref 44.0–60.0)
PH VEN: 7.195 — AB (ref 7.250–7.430)
PH VEN: 7.258 (ref 7.250–7.430)
PH VEN: 7.258 (ref 7.250–7.430)
PH VEN: 7.263 (ref 7.250–7.430)
PH VEN: 7.277 (ref 7.250–7.430)
PH VEN: 7.28 (ref 7.250–7.430)
PH VEN: 7.29 (ref 7.250–7.430)
PO2 VEN: 27 mmHg — AB (ref 32.0–45.0)
PO2 VEN: 28 mmHg — AB (ref 32.0–45.0)
PO2 VEN: 29 mmHg — AB (ref 32.0–45.0)
PO2 VEN: 30 mmHg — AB (ref 32.0–45.0)
PO2 VEN: 30 mmHg — AB (ref 32.0–45.0)
PO2 VEN: 30 mmHg — AB (ref 32.0–45.0)
PO2 VEN: 33 mmHg (ref 32.0–45.0)
POTASSIUM: 3.1 mmol/L — AB (ref 3.5–5.1)
POTASSIUM: 3.1 mmol/L — AB (ref 3.5–5.1)
POTASSIUM: 3.1 mmol/L — AB (ref 3.5–5.1)
POTASSIUM: 3.2 mmol/L — AB (ref 3.5–5.1)
POTASSIUM: 3.2 mmol/L — AB (ref 3.5–5.1)
POTASSIUM: 3.2 mmol/L — AB (ref 3.5–5.1)
POTASSIUM: 3.3 mmol/L — AB (ref 3.5–5.1)
POTASSIUM: 3.6 mmol/L (ref 3.5–5.1)
Patient temperature: 93.5
Patient temperature: 94.5
Patient temperature: 94.5
Patient temperature: 94.5
Patient temperature: 96
Patient temperature: 96
Patient temperature: 96
Potassium: 2.9 mmol/L — ABNORMAL LOW (ref 3.5–5.1)
Potassium: 3.2 mmol/L — ABNORMAL LOW (ref 3.5–5.1)
Potassium: 3.2 mmol/L — ABNORMAL LOW (ref 3.5–5.1)
Potassium: 3.3 mmol/L — ABNORMAL LOW (ref 3.5–5.1)
SODIUM: 142 mmol/L (ref 135–145)
SODIUM: 141 mmol/L (ref 135–145)
SODIUM: 139 mmol/L (ref 135–145)
SODIUM: 142 mmol/L (ref 135–145)
SODIUM: 132 mmol/L — AB (ref 135–145)
SODIUM: 141 mmol/L (ref 135–145)
SODIUM: 140 mmol/L (ref 135–145)
Sodium: 141 mmol/L (ref 135–145)
Sodium: 141 mmol/L (ref 135–145)
Sodium: 141 mmol/L (ref 135–145)
Sodium: 141 mmol/L (ref 135–145)
Sodium: 141 mmol/L (ref 135–145)
Sodium: 141 mmol/L (ref 135–145)
TCO2: 27 mmol/L (ref 22–32)
TCO2: 27 mmol/L (ref 22–32)
TCO2: 28 mmol/L (ref 22–32)
TCO2: 28 mmol/L (ref 22–32)
TCO2: 28 mmol/L (ref 22–32)
TCO2: 29 mmol/L (ref 22–32)
TCO2: 29 mmol/L (ref 22–32)
TCO2: 30 mmol/L (ref 22–32)
TCO2: 30 mmol/L (ref 22–32)
TCO2: 30 mmol/L (ref 22–32)
TCO2: 30 mmol/L (ref 22–32)
TCO2: 31 mmol/L (ref 22–32)
TCO2: 32 mmol/L (ref 22–32)
pCO2, Ven: 54.1 mmHg (ref 44.0–60.0)
pCO2, Ven: 58.7 mmHg (ref 44.0–60.0)
pCO2, Ven: 61 mmHg — ABNORMAL HIGH (ref 44.0–60.0)
pCO2, Ven: 62.1 mmHg — ABNORMAL HIGH (ref 44.0–60.0)
pCO2, Ven: 65 mmHg — ABNORMAL HIGH (ref 44.0–60.0)
pCO2, Ven: 64.5 mmHg — ABNORMAL HIGH (ref 44.0–60.0)
pCO2, Ven: 66.1 mmHg — ABNORMAL HIGH (ref 44.0–60.0)
pCO2, Ven: 75.9 mmHg (ref 44.0–60.0)
pH, Ven: 7.28 (ref 7.250–7.430)
pH, Ven: 7.265 (ref 7.250–7.430)
pH, Ven: 7.234 — ABNORMAL LOW (ref 7.250–7.430)
pH, Ven: 7.267 (ref 7.250–7.430)
pH, Ven: 7.23 — ABNORMAL LOW (ref 7.250–7.430)
pH, Ven: 7.242 — ABNORMAL LOW (ref 7.250–7.430)
pO2, Ven: 31 mmHg — CL (ref 32.0–45.0)
pO2, Ven: 31 mmHg — CL (ref 32.0–45.0)
pO2, Ven: 30 mmHg — CL (ref 32.0–45.0)
pO2, Ven: 33 mmHg (ref 32.0–45.0)
pO2, Ven: 37 mmHg (ref 32.0–45.0)
pO2, Ven: 28 mmHg — CL (ref 32.0–45.0)

## 2017-06-11 LAB — RENAL FUNCTION PANEL
ALBUMIN: 1.2 g/dL — AB (ref 3.5–5.0)
ALBUMIN: 1.5 g/dL — AB (ref 3.5–5.0)
Anion gap: 12 (ref 5–15)
Anion gap: 13 (ref 5–15)
BUN: 36 mg/dL — AB (ref 6–20)
BUN: 17 mg/dL (ref 6–20)
CALCIUM: 8.3 mg/dL — AB (ref 8.9–10.3)
CO2: 28 mmol/L (ref 22–32)
CO2: 29 mmol/L (ref 22–32)
CREATININE: 1.79 mg/dL — AB (ref 0.44–1.00)
CREATININE: 0.9 mg/dL (ref 0.44–1.00)
Calcium: 8.8 mg/dL — ABNORMAL LOW (ref 8.9–10.3)
Chloride: 99 mmol/L — ABNORMAL LOW (ref 101–111)
Chloride: 97 mmol/L — ABNORMAL LOW (ref 101–111)
GFR calc Af Amer: 30 mL/min — ABNORMAL LOW (ref >60)
GFR calc Af Amer: >60 mL/min (ref >60)
GFR, EST NON AFRICAN AMERICAN: 26 mL/min — AB (ref >60)
GFR, EST NON AFRICAN AMERICAN: 59 mL/min — AB (ref >60)
Glucose, Bld: 231 mg/dL — ABNORMAL HIGH (ref 65–99)
Glucose, Bld: 217 mg/dL — ABNORMAL HIGH (ref 65–99)
PHOSPHORUS: 2.3 mg/dL — AB (ref 2.5–4.6)
PHOSPHORUS: 1.8 mg/dL — AB (ref 2.5–4.6)
POTASSIUM: 3.1 mmol/L — AB (ref 3.5–5.1)
POTASSIUM: 3.4 mmol/L — AB (ref 3.5–5.1)
SODIUM: 139 mmol/L (ref 135–145)
Sodium: 139 mmol/L (ref 135–145)

## 2017-06-11 LAB — POCT I-STAT, CHEM 8
BUN: 16 mg/dL (ref 6–20)
BUN: 21 mg/dL — AB (ref 6–20)
CHLORIDE: 95 mmol/L — AB (ref 101–111)
CREATININE: 0.7 mg/dL (ref 0.44–1.00)
CREATININE: 1 mg/dL (ref 0.44–1.00)
Calcium, Ion: 0.71 mmol/L — CL (ref 1.15–1.40)
Calcium, Ion: 1.11 mmol/L — ABNORMAL LOW (ref 1.15–1.40)
Chloride: 92 mmol/L — ABNORMAL LOW (ref 101–111)
GLUCOSE: 134 mg/dL — AB (ref 65–99)
Glucose, Bld: 157 mg/dL — ABNORMAL HIGH (ref 65–99)
HCT: 35 % — ABNORMAL LOW (ref 36.0–46.0)
HCT: 42 % (ref 36.0–46.0)
Hemoglobin: 11.9 g/dL — ABNORMAL LOW (ref 12.0–15.0)
Hemoglobin: 14.3 g/dL (ref 12.0–15.0)
POTASSIUM: 5.8 mmol/L — AB (ref 3.5–5.1)
Potassium: 3.7 mmol/L (ref 3.5–5.1)
SODIUM: 140 mmol/L (ref 135–145)
Sodium: 136 mmol/L (ref 135–145)
TCO2: 28 mmol/L (ref 22–32)
TCO2: 29 mmol/L (ref 22–32)

## 2017-06-11 LAB — GLUCOSE, CAPILLARY
GLUCOSE-CAPILLARY: 125 mg/dL — AB (ref 65–99)
GLUCOSE-CAPILLARY: 178 mg/dL — AB (ref 65–99)
Glucose-Capillary: 224 mg/dL — ABNORMAL HIGH (ref 65–99)
Glucose-Capillary: 200 mg/dL — ABNORMAL HIGH (ref 65–99)
Glucose-Capillary: 156 mg/dL — ABNORMAL HIGH (ref 65–99)
Glucose-Capillary: 121 mg/dL — ABNORMAL HIGH (ref 65–99)

## 2017-06-11 LAB — CBC
HCT: 35.6 % — ABNORMAL LOW (ref 36.0–46.0)
HEMOGLOBIN: 11.5 g/dL — AB (ref 12.0–15.0)
MCH: 29.9 pg (ref 26.0–34.0)
MCHC: 32.3 g/dL (ref 30.0–36.0)
MCV: 92.7 fL (ref 78.0–100.0)
Platelets: 24 10*3/uL — CL (ref 150–400)
RBC: 3.84 MIL/uL — ABNORMAL LOW (ref 3.87–5.11)
RDW: 16.4 % — AB (ref 11.5–15.5)
WBC: 7.2 10*3/uL (ref 4.0–10.5)

## 2017-06-11 LAB — POCT ACTIVATED CLOTTING TIME: ACTIVATED CLOTTING TIME: 175 s

## 2017-06-11 LAB — MAGNESIUM: MAGNESIUM: 1.8 mg/dL (ref 1.7–2.4)

## 2017-06-11 MED ORDER — POTASSIUM CHLORIDE 10 MEQ/50ML IV SOLN
10.0000 meq | INTRAVENOUS | Status: AC
  Administered 2017-06-11 (×2): 10 meq via INTRAVENOUS
  Filled 2017-06-11 (×2): qty 50

## 2017-06-11 MED ORDER — INSULIN ASPART 100 UNIT/ML ~~LOC~~ SOLN
0.0000 [IU] | SUBCUTANEOUS | Status: DC
  Administered 2017-06-11: 2 [IU] via SUBCUTANEOUS
  Administered 2017-06-11: 3 [IU] via SUBCUTANEOUS
  Administered 2017-06-11: 2 [IU] via SUBCUTANEOUS
  Administered 2017-06-11: 3 [IU] via SUBCUTANEOUS
  Administered 2017-06-12 – 2017-06-14 (×8): 2 [IU] via SUBCUTANEOUS

## 2017-06-11 MED ORDER — FENTANYL 2500MCG IN NS 250ML (10MCG/ML) PREMIX INFUSION
25.0000 ug/h | INTRAVENOUS | Status: DC
  Administered 2017-06-11: 50 ug/h via INTRAVENOUS
  Filled 2017-06-11: qty 250

## 2017-06-11 MED ORDER — POTASSIUM CHLORIDE 10 MEQ/100ML IV SOLN: 10.0000 meq | INTRAVENOUS | Status: DC

## 2017-06-11 MED ORDER — TRACE MINERALS CR-CU-MN-SE-ZN 10-1000-500-60 MCG/ML IV SOLN
INTRAVENOUS | Status: AC
  Administered 2017-06-11: 18:00:00 via INTRAVENOUS
  Filled 2017-06-11: qty 548.8

## 2017-06-11 MED ORDER — MAGNESIUM SULFATE 50 % IJ SOLN
3.0000 g | Freq: Once | INTRAVENOUS | Status: AC
  Administered 2017-06-11: 3 g via INTRAVENOUS
  Filled 2017-06-11: qty 6

## 2017-06-11 MED ORDER — POTASSIUM CHLORIDE 10 MEQ/50ML IV SOLN
10.0000 meq | INTRAVENOUS | Status: AC
  Administered 2017-06-11 (×3): 10 meq via INTRAVENOUS
  Filled 2017-06-11 (×3): qty 50

## 2017-06-11 MED ORDER — SODIUM CHLORIDE 0.9 % IV SOLN
INTRAVENOUS | Status: DC
  Administered 2017-06-11: 14:00:00 via INTRAVENOUS

## 2017-06-11 MED ORDER — SODIUM PHOSPHATES 45 MMOLE/15ML IV SOLN
30.0000 mmol | Freq: Once | INTRAVENOUS | Status: AC
  Administered 2017-06-11: 30 mmol via INTRAVENOUS
  Filled 2017-06-11: qty 10

## 2017-06-11 MED ORDER — POTASSIUM CHLORIDE 10 MEQ/100ML IV SOLN
10.0000 meq | INTRAVENOUS | Status: DC
  Administered 2017-06-11: 10 meq via INTRAVENOUS
  Filled 2017-06-11: qty 100

## 2017-06-11 MED ORDER — TRACE MINERALS CR-CU-MN-SE-ZN 10-1000-500-60 MCG/ML IV SOLN
INTRAVENOUS | Status: DC
  Filled 2017-06-11 (×2): qty 548.8

## 2017-06-11 MED ORDER — FENTANYL BOLUS VIA INFUSION
25.0000 ug | INTRAVENOUS | Status: DC | PRN
  Filled 2017-06-11: qty 25

## 2017-06-11 NOTE — Progress Notes (Signed)
eLink Physician-Brief Progress Note Patient Name: [name] DOB: Apr 08, 1935 MRN: [id number]   Date of Service  06/11/2017  HPI/Events of Note  K+ = 3.2 and patient is on CRRT.   eICU Interventions  Will defer K+ replacement to Nephrology.      Intervention Category Major Interventions: Electrolyte abnormality - evaluation and management  Sommer,Steven Eugene 06/11/2017, 5:50 AM

## 2017-06-11 NOTE — Progress Notes (Addendum)
PHARMACY - ADULT TOTAL PARENTERAL NUTRITION CONSULT NOTE   Pharmacy Consult:  TPN Indication:  Prolonged NPO status  Patient Measurements: Height: 5' 2.99" (160 cm) Weight: 283 lb (128.4 kg) IBW/kg (Calculated) : 52.38 TPN AdjBW (KG): 64 Body mass index is 50.14 kg/m.  Assessment:  60 YOF presented on 06/13/2017 with CHF exacerbation, hypotension and elevated troponins.  PMH significant for AFib, dHF, CKD3, GERD, HLD, CAD, and pulmonary HTN.  Patient has gangrenous left thigh/leg and underwent multiple I&Ds.  She required transfer to the ICU after the I&D on 05/28/2017 d/t hypotension from duodenal ulcer bleeding.  Patient underwent ex-lap with pyloroplasty and oversewing of the bleeding ulcer on the same day.  Per charting, patient was on a carb modified diet through 06/03/17.  He has been NPO x6 days with anticipated prolonged NPO status; therefore, Pharmacy consulted to manage TPN.  GI: GIB on IV PPI BID, NG O/P 556mL, drains O/P 1667mL.  ?BL prealbumin >60 Endo: no hx DM - CBGs low-low normal prior TPN, now elevated Insulin requirements in the past 24 hours: 4 units SSI Lytes: ?refeeding.  Spoke to Renal, keep lytes out of TPN for now.  K+ 3.1 (4 runs ordered), CL slightly low at 99, Phos low at 2.3 (NaPhos 30 mmol ordered), iCa 0.48-11 (Ca gluconate through CRRT) *Clinimix without lytes: 10/22>>10/23, customized TPN started 10/24 Renal: CKD3 - CRRT started 10/23 - SCr down 1.79 (BL SCr 0.8-1.1), BUN down to 36 - no UOP, net +18.3L since admit, D5NS at 20 ml/hr Pulm: intubated 10/17, FiO2 40% Cards: s/p code blue, MAP 60s, brady, CVP 7, in Afib - Levophed gtt AC: Eliquis for Afib held since 9/23 d/t LLE hematoma - mild anemia, plts down to 34K Ortho: gangrenous left thigh/leg s/p I&D 10/5, 10/10, 10/12, 10/17 Hepatobil: LFTs improving, tbili down 1.6 (no jaundice) Neuro: PRN Fentanyl - GCS 15, RASS at goal 0 ID: Zosyn/Cubicin for infected leg wound - afebrile, WBC down to 14.8 Best  Practices: CHG TPN Access: CVC placed 06/05/17 TPN start date: 06/09/17  Nutritional Goals (per RD rec on 10/19): 9735-3299 kCal and 130gm protein per day  Current Nutrition:  TPN   Plan:  - Customized TPN (AA/CHO: 98/16) at 35 ml/hr (goal rate ~55 ml/hr), no electrolytes per Renal's request.  TPN providing 82gm of protein and 134g of CHO = 786 kCal, meeting ~60% of total needs. - Hold 20% ILE for the first 7 days of TPN in ICU patients per ASPEN/SCCM guidelines (start date 06/16/17) - Daily multivitamin and trace elements in TPN  - Change SSI to moderate and continue at Q4H (less CHO provided in customized TPN compared to yesterday) - Mag sulfate 3gm IV x 1  - F/U daily   Roopa Graver D. Mina Marble, PharmD, BCPS Pager:  [phone] 06/11/2017, 7:10 AM

## 2017-06-11 NOTE — Progress Notes (Signed)
Citrate level nearing 400 per protocol- reported to [place]. Instructed to cap citrate at 250 and not continue to titrate up. RN will continue to monitor levels.

## 2017-06-11 NOTE — Progress Notes (Signed)
PULMONARY / CRITICAL CARE MEDICINE   Name: [name] MRN: [id number] DOB: 03/17/1935    ADMISSION DATE:  05/31/2017 CONSULTATION DATE:  06/11/2017  REFERRING MD:  Dr. Sherral Hammers  CHIEF COMPLAINT:  Acute hypotension  HISTORY OF PRESENT ILLNESS:   82 year old female with history of HFpEF, necrotizing fasciitis of left lower leg, HLD, tricuspid regurg, pulmonary hypertension, CKD III, COPD on 2.5L at night presents with hypotension requiring pressor support after repeat left leg wound debridement earlier today (10/17). Patient was in her usual state of health until 9/22 when she presented for traumatic left leg hematoma and hypovolemic shock. She was admitted to the hospital 05/10/17-05/16/2017 and subsequently discharged to SNF where she experienced worsening weakness. She was then readmitted with acute on chronic HFpEF exacerbation on 10/1.  During this hospital stay patient continued to have hemodynamic instability requiring 8 units PRBC over her hospital course. She has been taken back to the OR  On 10/5, 10/10, 10/12, and 10/17 for further wound debridement. Today, after her procedure was completed and wound vac in place, she was in PACU where her blood pressure was noted to drop to 52D systolic. She was given albumin. Neo was initiated. Patient indicated she was dizzy and lightheaded. 3u PRBC were administered. PCCM was called for consult.  Of note patient had grossly bloody stool in the PACU with her hypotensive episode. Bowel movement was described as formed but grossly bloody/in a pool of blood. She denies prior episodes of melena or hematochezia. She endorses being constipated over past few days.   SUBJECTIVE: No acute issues.  Tolerating CRRT well.  Remains on levophed, getting weaned down now. Ortho planning left AKA.  VITAL SIGNS: BP 91/80   Pulse (!) 49   Temp (!) 96.1 F (35.6 C) (Axillary) Comment: Haleigh, RN notified  Resp 19   Ht 5' 2.99" (1.6 m)   Wt 128.4 kg (283 lb)    SpO2 100%   BMI 50.14 kg/m   HEMODYNAMICS: CVP:  [6 mmHg-10 mmHg] 10 mmHg  VENTILATOR SETTINGS: Vent Mode: PRVC FiO2 (%):  [40 %] 40 % Set Rate:  [16 bmp] 16 bmp Vt Set:  [450 mL] 450 mL PEEP:  [5 cmH20] 5 cmH20 Plateau Pressure:  [14 cmH20-23 cmH20] 14 cmH20  INTAKE / OUTPUT: I/O last 3 completed shifts: In: 4730.2 [I.V.:4500.2; NG/GT:30; IV Piggyback:200] Out: 4912 [Emesis/NG output:700; Drains:2125; Other:2087]  PHYSICAL EXAMINATION: General:  Adult female, in NAD Neuro:  Awake on vent, following commands. Cardiovascular:  Irregularly irregular, no m/r/g. ETT in place. Lungs:  CTA anteriorly, no W/R/R Abdomen:  Surgical dressing is clean and dry. Abdomen mildly distended and appropriately tender without rebound or guarding. JP drain with dark fluid. Musculoskeletal: left wound vac, leg wrapped, bandage clean and dry Skin:  No rashes or lesions  LABS:  BMET  Recent Labs Lab 06/10/17 0555  06/10/17 1700  06/11/17 0431 06/11/17 0559 06/11/17 0608  NA 142  < > 139  < > 139 141 141  K 3.8  < > 3.7  < > 3.1* 2.9* 3.1*  CL 107  --  104  --  99*  --   --   CO2 22  --  23  --  28  --   --   BUN 63*  --  54*  --  36*  --   --   CREATININE 3.22*  --  2.73*  --  1.79*  --   --   GLUCOSE 109*  --  188*  --  231*  --   --   < > = values in this interval not displayed.  Electrolytes  Recent Labs Lab 06/09/17 0500  06/10/17 0555 06/10/17 1700 06/11/17 0431  CALCIUM 7.7*  < > 8.0* 8.4* 8.8*  MG 1.5*  --  1.8  --  1.8  PHOS 4.8*  --  5.1* 4.2 2.3*  < > = values in this interval not displayed.  CBC  Recent Labs Lab 06/09/17 0500 06/10/17 0555  06/10/17 1742  06/11/17 0344 06/11/17 0559 06/11/17 0608  WBC 19.9* 14.8*  --  11.6*  --   --   --   --   HGB 11.3* 11.7*  < > 12.4  < > 12.2 11.2* 12.9  HCT 35.1* 37.6  < > 38.4  < > 36.0 33.0* 38.0  PLT 48* 46*  --  34*  --   --   --   --   < > = values in this interval not displayed.  Coag's  Recent Labs Lab  06/05/17 1225 06/05/17 2303 06/13/2017 0719 06/07/17 0500 06/08/17 0830 06/09/17 0500  APTT 36 34 33  --   --   --   INR 2.44 1.55 1.51 1.59 1.45 1.47    Sepsis Markers  Recent Labs Lab 06/05/17 1154 06/05/17 2252  LATICACIDVEN 6.2* 4.2*    ABG  Recent Labs Lab 06/07/17 0450 06/07/17 0845 06/08/17 0450  PHART 7.493* 7.405 7.349*  PCO2ART 33.4 42.2 47.7  PO2ART 81.5* 75.0* 92.1    Liver Enzymes  Recent Labs Lab 06/05/17 0413 06/09/17 0500 06/10/17 0555 06/10/17 1700 06/11/17 0431  AST 2,448* 121* 49*  --   --   ALT 389* 19 11*  --   --   ALKPHOS 92 130* 139*  --   --   BILITOT 3.5* 1.6* 1.6*  --   --   ALBUMIN 1.6* 1.4* 1.4* 1.4* 1.2*    Cardiac Enzymes  Recent Labs Lab 06/05/2017 1520 06/05/17 1208  TROPONINI 0.04* 0.38*    Glucose  Recent Labs Lab 06/10/17 1212 06/10/17 1546 06/10/17 2001 06/10/17 2348 06/11/17 0341 06/11/17 0802  GLUCAP 105* 97 147* 207* 224* 200*    Imaging Dg Chest Port 1 View  Result Date: 06/10/2017 CLINICAL DATA:  Catheter insertion.  Hypoxia. EXAM: PORTABLE CHEST 1 VIEW COMPARISON:  Study obtained earlier in the day FINDINGS: Dialysis catheter tip is in the superior vena cava. Right jugular catheter tip is in the superior vena cava. Endotracheal tube tip is 3.6 cm above the carina. Nasogastric tube tip and side port are below the diaphragm. No pneumothorax. There are bilateral pleural effusions. There is interstitial and alveolar edema, stable. There is airspace consolidation in the right lower lobe with volume loss. There is also perihilar airspace opacity. There is cardiomegaly with pulmonary venous hypertension. No evident adenopathy. No bone lesions. There is aortic atherosclerosis. IMPRESSION: Dialysis catheter tip in superior vena cava. Other tubes and catheters as described. No pneumothorax. Cardiomegaly with pulmonary venous hypertension. Pulmonary edema and pleural effusions remain. Airspace consolidation right  lower lobe and left perihilar regions raise concern for superimposed pneumonia. The appearance of the lungs and cardiac silhouette is stable compared to earlier in the day. There is aortic atherosclerosis. Aortic Atherosclerosis (ICD10-I70.0). Electronically Signed   By: Lowella Grip III M.D.   On: 06/10/2017 12:55     STUDIES:  Renal US 10/22 > no lesions or obstructing stone.  CULTURES: Surgical wound culture 10/10 >> E coli,  Pseudomonas, Morganella morganii, proteus, VRE  ANTIBIOTICS: 10/10 Ancef >> 10/12 10/15 Linezolid >> 10/21 10/12 Zosyn >> Daptomycin 10/22 >>   SIGNIFICANT EVENTS: 10/5 Left thigh and left leg debridement 10/10 Repeat irrigation and debridement left thigh and left leg 10/12 Debridement left leg and thigh 10/17 Debridement left thigh wound 10/17 EGD, exploratory laparotomy with bleeding duodenal ulcer/perforated duodenum. Intubated, maxed on 4 pressors in ICU. 10/23- cvvvhd  LINES/TUBES: PIV x3 Foley catheter Negative pressure wound L leg Closed system drain RLQ bulb CVC triple lumen NG/OG  DISCUSSION: 82 year old female with history of HFpEF, necrotizing fasciitis of left lower leg, HLD, tricuspid regurg, pulmonary hypertension, CKD III, COPD on 2L at night, presents after repeat left lower extremity debridement with acute hypotension. Subsequently found to have duodenal ulcer with perforation, in hemorrhagic shock. Maxed on 4 pressors in ICU, intubated.   ASSESSMENT / PLAN:  GASTROINTESTINAL A:   Acute GI bleed 2/2 duodenal ulcer with hemorrhagic shock - s/p ex lap with repair 10/17 Shock liver; ALT 389, AST 2448 Nutrition P:   Surgery, GI following UGI series once able per surgery PPI gtt Follow coags, CBC Continue TNA per pharm  PULMONARY A: Pulmonary Hypertension - RHC 11/2015 with PAH 59/17 mmHg Mechanically ventilated P:   Holding home lasix, letaris Continue vent support SBT daily, goal 5/5 Hopeful extubation soon Bronchial  hygiene CXR intermittently  CARDIOVASCULAR A:  HFpEF (Last echo 05/21/2017 indicates EF 60-65%, moderate MR, mod pulm HTN) with intermittent episodes of hypotension. Hypovolemic shock requiring massive blood transfusion Paroxysmal Atrial fibrillation CAD Elevated troponin P:  Continue levophed for goal MAP > 65; hoping to wean down and to off soon No anticoagulation due to GIB Follow CVP Monitor intake/output  RENAL A:   AKI on CKD stage III (baseline Cr 0.8-1.1), worsening.  Continues to have anuria Hypokalemia Hypocalcemia P:   Continue CRRT per nephrology 1g Ca gluconate Monitor electrolytes Follow BMP q12hrs given anuria  HEMATOLOGIC A:   Hemorrhagic shock requiring massive blood transfusions  Acute blood loss anemia - due to above P:  Transfuse for Hgb < 8 CBC in AM  INFECTIOUS A:   Surgical wound culture 10/10 >> E coli, Pseudomonas, Morganella morganii, proteus, VRE Massive wound necrosis of LLE, will require amputation P:   S/p linezolid >> now on zosyn and daptomycin Follow wound cultures Ortho planning for left AKA once stabilized   ENDOCRINE A:   Hypoglycemia - resolved P:   Continue D5NS  NEUROLOGIC A:   Remains neurologically intact, no acute process  P:   Monitor mental status  FAMILY  - Inter-disciplinary family meet or Palliative Care meeting due by:  06/11/2017.  CC time: 35 min.   Montey Hora, [place] [facility] Pager: [phone]  or [phone] 06/11/2017, 9:15 AM  STAFF NOTE: I, Merrie Roof, MD FACP have personally reviewed patient's available data, including medical history, events of note, physical examination and test results as part of my evaluation. I have discussed with resident/NP and other care providers such as pharmacist, RN and RRT. In addition, I personally evaluated patient and elicited key findings of: awake, alert, fc, crackles, jvd, abdo edema, drain, edema severe rt,  remains on levophed, pulling 50 hr fluids, was neg 566 cc last 24 hours, I reviewed past pcxr with well placed new HD catn and edema, VBG reviewed likley equates to reasonable arterial ph, wean again today cpap 5 ps 5, goal 2  hours, reluctant to extubate until volume removed improve also need define next OR trip, pcxr in am , maintain neg balance, levophed to MAp goal 60, plat noted likely MODS and dilution, as she has not been on heparin I am reluctant to order HIT assay, follow plat count with neg a balance, no role transfusion unless bleeding worsen, hct in am , TPN, family updated, dapto and zosyn , wil need to establish stop dates soon The patient is critically ill with multiple organ systems failure and requires high complexity decision making for assessment and support, frequent evaluation and titration of therapies, application of advanced monitoring technologies and extensive interpretation of multiple databases.   Critical Care Time devoted to patient care services described in this note is 35 Minutes. This time reflects time of care of this signee: Merrie Roof, MD FACP. This critical care time does not reflect procedure time, or teaching time or supervisory time of PA/NP/Med student/Med Resident etc but could involve care discussion time. Rest per NP/medical resident whose note is outlined above and that I agree with   Lavon Paganini. Titus Mould, MD, [place] Pgr: [place] [facility] 06/11/2017 10:54 AM

## 2017-06-11 NOTE — Consult Note (Signed)
ORTHOPAEDIC CONSULTATION  REQUESTING PHYSICIAN: Raylene Miyamoto, MD  Chief Complaint: Left lower extremity wound  HPI: [place] is a 82 y.o. female who has had a complex medical history recently who originally presented with a traumatic hematoma of her left thigh and lower leg that was subsequently debrided and improving when she was readmitted for a perforated ulcer that required surgery.  She was admitted to the ICU and was placed on multiple pressors and as a result she developed progressive necrosis of her left lower extremity wounds.  She now has a large soft tissue defect that extends from her distal thigh to the level of the ankle.  She is currently in the ICU intubated on 1 pressor.  I was asked by my partner to evaluate the patient as a second opinion at the request of the patient's family.  Past Medical History:  Diagnosis Date  . A-fib ([place])   . Arthritis    "knees" (11/17/2015)  . CAD (coronary artery disease)    BMS to LAD 07/2005  . Chronic atrial fibrillation ([place])    Since 2011  . Degenerative arthritis of right knee   . Essential hypertension   . GERD (gastroesophageal reflux disease)   . Hyperlipidemia   . Morbid obesity ([place])   . Myocardial infarction ([place]) 07/2005  . On home oxygen therapy    "2.5L at night" (11/17/2015)  . Psoriasis   . Pulmonary hypertension ([facility])    Mixed, PASP 67 mmHg at [facility] 2013  . Right ventricular dysfunction   . Tricuspid regurgitation    Severe   Past Surgical History:  Procedure Laterality Date  . ACHILLES TENDON SURGERY Right 1970s  . CARDIAC CATHETERIZATION N/A 12/07/2015   Procedure: Right Heart Cath;  Surgeon: Jolaine Artist, MD;  Location: [place] [facility];  Service: Cardiovascular;  Laterality: N/A;  . CARPAL TUNNEL RELEASE  11/29/2011   Procedure: CARPAL TUNNEL RELEASE;  Surgeon: Cammie Sickle., MD;  Location: [place];  Service: Orthopedics;  Laterality: Right;  . COLONOSCOPY      . CORONARY ANGIOPLASTY WITH STENT PLACEMENT  07/2005   Dr. Olevia Perches  . ESOPHAGOGASTRODUODENOSCOPY N/A 05/28/2017   Procedure: ESOPHAGOGASTRODUODENOSCOPY (EGD);  Surgeon: Milus Banister, MD;  Location[facility];  Service: Endoscopy;  Laterality: N/A;  . EYE SURGERY    . I&D EXTREMITY Left 06/04/2017   Procedure: IRRIGATION AND DEBRIDEMENT LEFT THIGH AND LEFT LEG;  Surgeon: [name], MD;  Location: [employer];  Service: Orthopedics;  Laterality: Left;  . I&D EXTREMITY Left 06/16/2017   Procedure: REPEAT IRRIGATION AND DEBRIDEMENT LEFT THIGH AND LEFT LEG;  Surgeon: [name], MD;  Location: [place];  Service: Orthopedics;  Laterality: Left;  . I&D EXTREMITY Left 06/13/2017   Procedure: DEBRIDEMENT LEFT THIGH WOUND;  Surgeon: [name], MD;  Location: [place];  Service: Orthopedics;  Laterality: Left;  . LAPAROSCOPIC CHOLECYSTECTOMY    . LAPAROTOMY N/A 06/04/2017   Procedure: EXPLORATORY LAPAROTOMY REPAIR OF DUODENAL ULCER;  Surgeon: Erroll Luna, MD;  Location: [place];  Service: General;  Laterality: N/A;  . RETINAL DETACHMENT SURGERY Right   . SKIN SPLIT GRAFT Left 06/18/2017   Procedure: DEBRIDEMENT LEFT LEG AND THIGH;  Surgeon: [name], MD;  Location: [place];  Service: Orthopedics;  Laterality: Left;   Social History   Social History  . Marital status: Married    Spouse name: N/A  . Number of children: 2  . Years  of education: N/A   Occupational History  . retired Retired   Social History Main Topics  . Smoking status: Former Smoker    Packs/day: 0.30    Years: 0.50    Types: Cigarettes    Quit date: 08/19/1986  . Smokeless tobacco: Never Used     Comment: pt only smoked for only a few months.  . Alcohol use No  . Drug use: No  . Sexual activity: No   Other Topics Concern  . None   Social History Narrative   Lives with husband on farm and [place]. 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 They both  stay active around the farm, maintaining lawn and garden.   Family History  Problem Relation Age of Onset  . Heart attack Mother   . Coronary artery disease Father   . Stroke Father   . Heart attack Brother   . Heart failure Brother   . COPD Brother   . Emphysema Sister   . Emphysema Brother    - negative except otherwise stated in the family history section No Known Allergies Prior to Admission medications   Medication Sig Start Date End Date Taking? Authorizing Provider  ALPRAZolam Duanne Moron) 0.5 MG tablet Take 1 tablet (0.5 mg total) by mouth at bedtime. 05/16/17  Yes Arrien, Jimmy Picket, MD  ambrisentan (LETAIRIS) 5 MG tablet Take 1 tablet (5 mg total) by mouth daily. 05/21/17  Yes Collene Gobble, MD  atorvastatin (LIPITOR) 80 MG tablet TAKE 1/2 TABLET BY MOUTH DAILY Patient taking differently: TAKE 1/2 TABLET (40 MG) BY MOUTH DAILY AT BEDTIME 08/07/15  Yes Satira Sark, MD  azelastine (ASTELIN) 0.1 % nasal spray Place 2 sprays into both nostrils 2 (two) times daily as needed (congestion).  02/21/17  Yes [provider]  brimonidine (ALPHAGAN) 0.2 % ophthalmic solution Place 1 drop into the right eye 2 (two) times daily.  04/21/15  Yes [provider]  brinzolamide (AZOPT) 1 % ophthalmic suspension Place 1 drop into both eyes 2 (two) times daily.   Yes [provider]  HYDROcodone-acetaminophen (NORCO/VICODIN) 5-325 MG tablet Take 1 tablet by mouth 3 (three) times daily as needed for moderate pain. 05/16/17  Yes Arrien, Jimmy Picket, MD  latanoprost (XALATAN) 0.005 % ophthalmic solution Place 1 drop into the right eye at bedtime.  03/30/12  Yes [provider]  nitroGLYCERIN (NITROSTAT) 0.4 MG SL tablet Place 0.4 mg under the tongue every 5 (five) minutes as needed for chest pain (x 3 tablets daily). Reported on 12/28/2015   Yes [provider]  Omeprazole 20 MG TBEC Take 20 mg by mouth daily.    Yes [provider]  OXYGEN  Inhale 2.5 L into the lungs at bedtime.   Yes [provider]  timolol (TIMOPTIC) 0.5 % ophthalmic solution Place 1 drop into both eyes 2 (two) times daily. 11/20/15  Yes [provider]   US Renal  Result Date: 06/09/2017 CLINICAL DATA:  Acute kidney insufficiency. EXAM: RENAL / URINARY TRACT ULTRASOUND COMPLETE COMPARISON:  PET scan 04/09/2014. FINDINGS: Right Kidney: Length: 9.9 cm, within normal limits. Renal parenchyma is thinned and hyperechoic relative to the index organ, the liver. There is no mass or stone. No obstructing lesion is present. Left Kidney: Length: 9.1 cm, within normal limits. Renal parenchyma is thinned and hyperechoic relative to the index organ, the spleen. There is no mass or stone. No obstructing lesion is present. Bladder: A  Foley catheter is in place. Bilateral pleural effusions are incidentally noted. IMPRESSION: 1. Thin hyperechoic kidneys bilaterally without focal mass lesion or obstructing stone. 2. The finding is nonspecific, but can be seen in the setting of medical renal disease. Electronically Signed   By: San Morelle M.D.   On: 06/09/2017 15:47   Dg Chest Port 1 View  Result Date: 06/10/2017 CLINICAL DATA:  Catheter insertion.  Hypoxia. EXAM: PORTABLE CHEST 1 VIEW COMPARISON:  Study obtained earlier in the day FINDINGS: Dialysis catheter tip is in the superior vena cava. Right jugular catheter tip is in the superior vena cava. Endotracheal tube tip is 3.6 cm above the carina. Nasogastric tube tip and side port are below the diaphragm. No pneumothorax. There are bilateral pleural effusions. There is interstitial and alveolar edema, stable. There is airspace consolidation in the right lower lobe with volume loss. There is also perihilar airspace opacity. There is cardiomegaly with pulmonary venous hypertension. No evident adenopathy. No bone lesions. There is aortic atherosclerosis. IMPRESSION: Dialysis catheter tip in superior vena cava. Other  tubes and catheters as described. No pneumothorax. Cardiomegaly with pulmonary venous hypertension. Pulmonary edema and pleural effusions remain. Airspace consolidation right lower lobe and left perihilar regions raise concern for superimposed pneumonia. The appearance of the lungs and cardiac silhouette is stable compared to earlier in the day. There is aortic atherosclerosis. Aortic Atherosclerosis (ICD10-I70.0). Electronically Signed   By: Lowella Grip III M.D.   On: 06/10/2017 12:55   Dg Chest Port 1 View  Result Date: 06/10/2017 CLINICAL DATA:  Respiratory failure. EXAM: PORTABLE CHEST 1 VIEW COMPARISON:  06/09/2017. FINDINGS: Endotracheal tube and right IJ line stable position. NG tube nodes its tip below the lower image margin. The hemidiaphragms are incompletely imaged. Cardiomegaly again noted. Pulmonary vascular congestion interstitial prominence with bilateral pleural effusions, left side greater than right again noted. Findings suggest congestive heart failure. Dense right lower lobe atelectasis and consolidation again noted. IMPRESSION: 1.  Lines and tubes in stable position. 2. Findings suggesting congestive heart failure with pulmonary pulmonary edema bilateral pleural effusions, left side greater than right. Findings have progressed from prior exam. 3. Persistent dense right lower lobe atelectasis and consolidation. Electronically Signed   By: Marcello Moores  Register   On: 06/10/2017 07:00   - pertinent xrays, CT, MRI studies were reviewed and independently interpreted  Positive ROS: All other systems have been reviewed and were otherwise negative with the exception of those mentioned in the HPI and as above.  Physical Exam: General: Alert, no acute distress Cardiovascular: No pedal edema Respiratory: No cyanosis, no use of accessory musculature GI: No organomegaly, abdomen is soft and non-tender Skin: No lesions in the area of chief complaint Neurologic: Sensation intact  distally Psychiatric: Patient is competent for consent with normal mood and affect Lymphatic: No axillary or cervical lymphadenopathy  MUSCULOSKELETAL:  Left lower extremity exam shows massive soft tissue loss from her thigh to the level of the ankle.  Her lower leg muscular compartments are exposed with necrotic skin edges and necrotic exposed tibia of approximately 15-20 cm.  This wound extends posterior medially also.  Assessment: Left lower extremity wound with massive soft tissue loss  Plan: After thorough evaluation of the patient's condition and her left lower extremity wounds I believe that limb salvage is not a viable option at this point given the massive amount of soft tissue loss.  I think that as long as these wounds continue to be open this is a significant stress to  the patient's physiologic reserve.  Essentially this is a matter of life over limb which the patient's family agrees with.  I will communicate with Dr. Sharol Given and he will likely proceed with AKA over the next couple of days  Thank you for the consult and the opportunity to see Ms. Newsome  N. Eduard Roux, MD [place] 3:33 PM

## 2017-06-11 NOTE — Consult Note (Signed)
   [facility] CM Inpatient Consult   06/11/2017  [place] [birth year] [id number]    [facility] Care Management follow up.  Chart reviewed. [name] remains in ICU on vent. Now on CVVHD according to chart notes.  Will continue follow for potential [facility] Care Management needs when appropriate.    Marthenia Rolling, MSN-Ed, RN,BSN [facility] Liaison [phone]

## 2017-06-11 NOTE — Progress Notes (Signed)
S: More interactive today, granddaughter is in the room.   O:BP 106/61   Pulse 77   Temp (!) 96 F (35.6 C) (Oral) Comment: Haleigh, RN notified  Resp (!) 22   Ht 5' 2.99" (1.6 m)   Wt 283 lb (128.4 kg)   SpO2 100%   BMI 50.14 kg/m   Intake/Output Summary (Last 24 hours) at 06/11/17 1150 Last data filed at 06/11/17 1100  Gross per 24 hour  Intake          3900.97 ml  Output             5401 ml  Net         -1500.03 ml   Intake/Output: I/O last 3 completed shifts: In: 4730.2 [I.V.:4500.2; NG/GT:30; IV Piggyback:200] Out: 4912 [Emesis/NG output:700; Drains:2125; Other:2087]  Intake/Output this shift:  Total I/O In: 696 [I.V.:450; IV Piggyback:246] Out: 1239 [Drains:25; Other:1214] Weight change:  Gen: No acute distress  CVS: RRR, no murmur appreciated  Gr2/6 holosys M Resp: lungs sound clear over anterior lung fields  GMW:NUUV, non tender, non distended, surgical dressing and drain in place no Bs Ext: 3+ lower extremity pitting edema presacral, abdm wall also  Recent Labs Lab 06/05/17 0413  06/08/17 0550  06/08/17 1745 06/09/17 0500 06/09/17 1500 06/09/17 2220 06/10/17 0555  06/10/17 1700  06/11/17 0153 06/11/17 0158 06/11/17 0339 06/11/17 0344 06/11/17 0431 06/11/17 0559 06/11/17 0608  NA 138  < > 139  < > 141 141 141 142 142  < > 139  < > 141 141 141 142 139 141 141  K 5.0  < > 3.8  < > 4.0 3.9 3.9 3.9 3.8  < > 3.7  < > 3.2* 3.1* 3.1* 3.2* 3.1* 2.9* 3.1*  CL 107  < > 107  < > 107 109 107 108 107  --  104  --   --   --   --   --  99*  --   --   CO2 16*  < > 26  < > _0 --  23  --   --   --   --   --  28  --   --   GLUCOSE 202*  < > 87  < > 86 77 84 93 109*  --  188*  --   --   --   --   --  231*  --   --   BUN 26*  < > 52*  < > 55* 56* 59* 61* 63*  --  54*  --   --   --   --   --  36*  --   --   CREATININE 1.36*  < > 2.59*  < > 2.74* 2.94* 3.03* 3.14* 3.22*  --  2.73*  --   --   --   --   --  1.79*  --   --   ALBUMIN 1.6*  --   --   --   --  1.4*   --   --  1.4*  --  1.4*  --   --   --   --   --  1.2*  --   --   CALCIUM 8.0*  < > 7.6*  < > 7.8* 7.7* 8.1* 8.0* 8.0*  --  8.4*  --   --   --   --   --  8.8*  --   --   PHOS  --   --  4.2  --   --  4.8*  --   --  5.1*  --  4.2  --   --   --   --   --  2.3*  --   --   AST 2,448*  --   --   --   --  121*  --   --  49*  --   --   --   --   --   --   --   --   --   --   ALT 389*  --   --   --   --  19  --   --  11*  --   --   --   --   --   --   --   --   --   --   < > = values in this interval not displayed. Liver Function Tests:  Recent Labs Lab 06/05/17 0413 06/09/17 0500 06/10/17 0555 06/10/17 1700 06/11/17 0431  AST 2,448* 121* 49*  --   --   ALT 389* 19 11*  --   --   ALKPHOS 92 130* 139*  --   --   BILITOT 3.5* 1.6* 1.6*  --   --   PROT 3.3* 4.2* 4.2*  --   --   ALBUMIN 1.6* 1.4* 1.4* 1.4* 1.2*   No results for input(s): LIPASE, AMYLASE in the last 168 hours. No results for input(s): AMMONIA in the last 168 hours. CBC:  Recent Labs Lab 06/08/17 0200 06/09/17 0500 06/10/17 0555  06/10/17 1742  06/11/17 0559 06/11/17 0608 06/11/17 0830  WBC 22.8* 19.9* 14.8*  --  11.6*  --   --   --  7.2  NEUTROABS  --   --  13.4*  --   --   --   --   --   --   HGB 11.5* 11.3* 11.7*  < > 12.4  < > 11.2* 12.9 11.5*  HCT 34.4* 35.1* 37.6  < > 38.4  < > 33.0* 38.0 35.6*  MCV 90.5 92.1 94.0  --  94.1  --   --   --  92.7  PLT 41* 48* 46*  --  34*  --   --   --  24*  < > = values in this interval not displayed. Cardiac Enzymes:  Recent Labs Lab 06/12/2017 1520 06/05/17 1208 06/10/17 0555  CKTOTAL  --   --  <5*  TROPONINI 0.04* 0.38*  --    CBG:  Recent Labs Lab 06/10/17 2001 06/10/17 2348 06/11/17 0341 06/11/17 0802 06/11/17 1145  GLUCAP 147* 207* 224* 200* 178*    Iron Studies: No results for input(s): IRON, TIBC, TRANSFERRIN, FERRITIN in the last 72 hours. Studies/Results: US Renal  Result Date: 06/09/2017 CLINICAL DATA:  Acute kidney insufficiency. EXAM: RENAL /  URINARY TRACT ULTRASOUND COMPLETE COMPARISON:  PET scan 04/09/2014. FINDINGS: Right Kidney: Length: 9.9 cm, within normal limits. Renal parenchyma is thinned and hyperechoic relative to the index organ, the liver. There is no mass or stone. No obstructing lesion is present. Left Kidney: Length: 9.1 cm, within normal limits. Renal parenchyma is thinned and hyperechoic relative to the index organ, the spleen. There is no mass or stone. No obstructing lesion is present. Bladder: A Foley catheter is in place. Bilateral pleural effusions are incidentally noted. IMPRESSION: 1. Thin hyperechoic kidneys bilaterally without focal mass lesion or obstructing stone. 2. The finding is nonspecific, but can be seen  in the setting of medical renal disease. Electronically Signed   By: San Morelle M.D.   On: 06/09/2017 15:47   Dg Chest Port 1 View  Result Date: 06/10/2017 CLINICAL DATA:  Catheter insertion.  Hypoxia. EXAM: PORTABLE CHEST 1 VIEW COMPARISON:  Study obtained earlier in the day FINDINGS: Dialysis catheter tip is in the superior vena cava. Right jugular catheter tip is in the superior vena cava. Endotracheal tube tip is 3.6 cm above the carina. Nasogastric tube tip and side port are below the diaphragm. No pneumothorax. There are bilateral pleural effusions. There is interstitial and alveolar edema, stable. There is airspace consolidation in the right lower lobe with volume loss. There is also perihilar airspace opacity. There is cardiomegaly with pulmonary venous hypertension. No evident adenopathy. No bone lesions. There is aortic atherosclerosis. IMPRESSION: Dialysis catheter tip in superior vena cava. Other tubes and catheters as described. No pneumothorax. Cardiomegaly with pulmonary venous hypertension. Pulmonary edema and pleural effusions remain. Airspace consolidation right lower lobe and left perihilar regions raise concern for superimposed pneumonia. The appearance of the lungs and cardiac  silhouette is stable compared to earlier in the day. There is aortic atherosclerosis. Aortic Atherosclerosis (ICD10-I70.0). Electronically Signed   By: Lowella Grip III M.D.   On: 06/10/2017 12:55   Dg Chest Port 1 View  Result Date: 06/10/2017 CLINICAL DATA:  Respiratory failure. EXAM: PORTABLE CHEST 1 VIEW COMPARISON:  06/09/2017. FINDINGS: Endotracheal tube and right IJ line stable position. NG tube nodes its tip below the lower image margin. The hemidiaphragms are incompletely imaged. Cardiomegaly again noted. Pulmonary vascular congestion interstitial prominence with bilateral pleural effusions, left side greater than right again noted. Findings suggest congestive heart failure. Dense right lower lobe atelectasis and consolidation again noted. IMPRESSION: 1.  Lines and tubes in stable position. 2. Findings suggesting congestive heart failure with pulmonary pulmonary edema bilateral pleural effusions, left side greater than right. Findings have progressed from prior exam. 3. Persistent dense right lower lobe atelectasis and consolidation. Electronically Signed   By: Marcello Moores  Register   On: 06/10/2017 07:00   . brimonidine  1 drop Right Eye BID  . brinzolamide  1 drop Both Eyes BID  . chlorhexidine gluconate (MEDLINE KIT)  15 mL Mouth Rinse BID  . Chlorhexidine Gluconate Cloth  6 each Topical Daily  . docusate sodium  100 mg Oral BID  . Gerhardt's butt cream   Topical BID  . insulin aspart  0-15 Units Subcutaneous Q4H  . latanoprost  1 drop Right Eye QHS  . mouth rinse  15 mL Mouth Rinse QID  . pantoprazole (PROTONIX) IV  40 mg Intravenous Q12H  . sodium chloride flush  3 mL Intravenous Q12H  . timolol  1 drop Both Eyes BID    BMET    Component Value Date/Time   NA 141 06/11/2017 0608   K 3.1 (L) 06/11/2017 0608   CL 99 (L) 06/11/2017 0431   CO2 28 06/11/2017 0431   GLUCOSE 231 (H) 06/11/2017 0431   BUN 36 (H) 06/11/2017 0431   CREATININE 1.79 (H) 06/11/2017 0431   CREATININE  1.10 (H) 01/31/2016 1118   CALCIUM 8.8 (L) 06/11/2017 0431   GFRNONAA 26 (L) 06/11/2017 0431   GFRAA 30 (L) 06/11/2017 0431   CBC    Component Value Date/Time   WBC 7.2 06/11/2017 0830   RBC 3.84 (L) 06/11/2017 0830   HGB 11.5 (L) 06/11/2017 0830   HCT 35.6 (L) 06/11/2017 0830   PLT 24 (LL) 06/11/2017  0830   MCV 92.7 06/11/2017 0830   MCH 29.9 06/11/2017 0830   MCHC 32.3 06/11/2017 0830   RDW 16.4 (H) 06/11/2017 0830   LYMPHSABS 0.8 06/10/2017 0555   MONOABS 0.5 06/10/2017 0555   EOSABS 0.1 06/10/2017 0555   BASOSABS 0.0 06/10/2017 0555     Assessment/Plan:  1. AKI on CKD III (baseline crt 0.8) likely secondary to hypotension with acute hemorrhagic shock and acute ischemic tubular necrosis. Initiated CRRT yesterday and peripheral edema seems to be improving today. Still anuric today.  1. Anticipate that hypokalemia will improve with IV potassium and dialysate  2. Low phos in combination with hypokalemia are concerning for refeeding syndrome 3. Ordered urine na and osmolality, urinalysis with microscopy, have not been able to collect enough urine yet for these  4. Citrate at 290 is okay but shouldn't need level over 250 to avoid clotting  Cont slow vol removal and solute control 2. Acute GI bleed s/p ex lap with repair - hgb stable  3. Hypotension - blood pressure improving somewhat, tapering levophed not sure bps accurate 3. Shock liver - transaminase improved  4. Thrombocytopenia -worsened today, not in need of transfusion  5. Paroxysmal atrial fibrillation - holding anticoagulation for GIB  6. Necrotizing fascitis with multiple species on culture - on zosyn Dapto,  ? amp 7. Pulm HTN - on the ventilator, PCCM attempting SBT but not planning to extubate  8 Obesity 10 CAD   I have seen and examined this patient and agree with the plan of care seen, eval, examined, discussed with staff, resident, changes made .  Mylissa Lambe L 06/11/2017, 1:50 PM   Ledell Noss, MD

## 2017-06-11 NOTE — Progress Notes (Signed)
7 Days Post-Op   Subjective/Chief Complaint: On vent awake  On CVVHD now    Objective: Vital signs in last 24 hours: Temp:  [93.1 F (33.9 C)-97.4 F (36.3 C)] 94.5 F (34.7 C) (10/24 0345) Pulse Rate:  [48-81] 49 (10/24 0700) Resp:  [12-28] 19 (10/24 0700) BP: (66-122)/(24-101) 91/80 (10/24 0718) SpO2:  [93 %-100 %] 100 % (10/24 0700) FiO2 (%):  [40 %] 40 % (10/24 0718) Last BM Date: 06/11/17  Intake/Output from previous day: 10/23 0701 - 10/24 0700 In: 3553.8 [I.V.:3453.8; IV Piggyback:100] Out: 4262 [Emesis/NG output:550; Drains:1625] Intake/Output this shift: No intake/output data recorded.  Incision/Wound:wound clean  Some serous drainage  JP looks like old blood and ascites   Lab Results:   Recent Labs  06/10/17 0555  06/10/17 1742  06/11/17 0559 06/11/17 0608  WBC 14.8*  --  11.6*  --   --   --   HGB 11.7*  < > 12.4  < > 11.2* 12.9  HCT 37.6  < > 38.4  < > 33.0* 38.0  PLT 46*  --  34*  --   --   --   < > = values in this interval not displayed. BMET  Recent Labs  06/10/17 1700  06/11/17 0431 06/11/17 0559 06/11/17 0608  NA 139  < > 139 141 141  K 3.7  < > 3.1* 2.9* 3.1*  CL 104  --  99*  --   --   CO2 23  --  28  --   --   GLUCOSE 188*  --  231*  --   --   BUN 54*  --  36*  --   --   CREATININE 2.73*  --  1.79*  --   --   CALCIUM 8.4*  --  8.8*  --   --   < > = values in this interval not displayed. PT/INR  Recent Labs  06/08/17 0830 06/09/17 0500  LABPROT 17.5* 17.7*  INR 1.45 1.47   ABG  Recent Labs  06/11/17 0559 06/11/17 0608  HCO3 27.3 28.0    Studies/Results: US Renal  Result Date: 06/09/2017 CLINICAL DATA:  Acute kidney insufficiency. EXAM: RENAL / URINARY TRACT ULTRASOUND COMPLETE COMPARISON:  PET scan 04/09/2014. FINDINGS: Right Kidney: Length: 9.9 cm, within normal limits. Renal parenchyma is thinned and hyperechoic relative to the index organ, the liver. There is no mass or stone. No obstructing lesion is present.  Left Kidney: Length: 9.1 cm, within normal limits. Renal parenchyma is thinned and hyperechoic relative to the index organ, the spleen. There is no mass or stone. No obstructing lesion is present. Bladder: A Foley catheter is in place. Bilateral pleural effusions are incidentally noted. IMPRESSION: 1. Thin hyperechoic kidneys bilaterally without focal mass lesion or obstructing stone. 2. The finding is nonspecific, but can be seen in the setting of medical renal disease. Electronically Signed   By: San Morelle M.D.   On: 06/09/2017 15:47   Dg Chest Port 1 View  Result Date: 06/10/2017 CLINICAL DATA:  Catheter insertion.  Hypoxia. EXAM: PORTABLE CHEST 1 VIEW COMPARISON:  Study obtained earlier in the day FINDINGS: Dialysis catheter tip is in the superior vena cava. Right jugular catheter tip is in the superior vena cava. Endotracheal tube tip is 3.6 cm above the carina. Nasogastric tube tip and side port are below the diaphragm. No pneumothorax. There are bilateral pleural effusions. There is interstitial and alveolar edema, stable. There is airspace consolidation in the right lower  lobe with volume loss. There is also perihilar airspace opacity. There is cardiomegaly with pulmonary venous hypertension. No evident adenopathy. No bone lesions. There is aortic atherosclerosis. IMPRESSION: Dialysis catheter tip in superior vena cava. Other tubes and catheters as described. No pneumothorax. Cardiomegaly with pulmonary venous hypertension. Pulmonary edema and pleural effusions remain. Airspace consolidation right lower lobe and left perihilar regions raise concern for superimposed pneumonia. The appearance of the lungs and cardiac silhouette is stable compared to earlier in the day. There is aortic atherosclerosis. Aortic Atherosclerosis (ICD10-I70.0). Electronically Signed   By: Lowella Grip III M.D.   On: 06/10/2017 12:55   Dg Chest Port 1 View  Result Date: 06/10/2017 CLINICAL DATA:  Respiratory  failure. EXAM: PORTABLE CHEST 1 VIEW COMPARISON:  06/09/2017. FINDINGS: Endotracheal tube and right IJ line stable position. NG tube nodes its tip below the lower image margin. The hemidiaphragms are incompletely imaged. Cardiomegaly again noted. Pulmonary vascular congestion interstitial prominence with bilateral pleural effusions, left side greater than right again noted. Findings suggest congestive heart failure. Dense right lower lobe atelectasis and consolidation again noted. IMPRESSION: 1.  Lines and tubes in stable position. 2. Findings suggesting congestive heart failure with pulmonary pulmonary edema bilateral pleural effusions, left side greater than right. Findings have progressed from prior exam. 3. Persistent dense right lower lobe atelectasis and consolidation. Electronically Signed   By: Marcello Moores  Register   On: 06/10/2017 07:00    Anti-infectives: Anti-infectives    Start     Dose/Rate Route Frequency Ordered Stop   06/11/17 1515  DAPTOmycin (CUBICIN) 1,000 mg in sodium chloride 0.9 % IVPB     1,000 mg 240 mL/hr over 30 Minutes Intravenous Every 48 hours 06/10/17 0734     06/10/17 1500  piperacillin-tazobactam (ZOSYN) IVPB 3.375 g     3.375 g 100 mL/hr over 30 Minutes Intravenous Every 6 hours 06/10/17 1017     06/10/17 1200  piperacillin-tazobactam (ZOSYN) IVPB 3.375 g  Status:  Discontinued     3.375 g 12.5 mL/hr over 240 Minutes Intravenous Every 6 hours 06/10/17 0718 06/10/17 0719   06/10/17 1200  piperacillin-tazobactam (ZOSYN) IVPB 3.375 g  Status:  Discontinued     3.375 g 100 mL/hr over 30 Minutes Intravenous Every 6 hours 06/10/17 0720 06/10/17 1017   06/09/17 2300  piperacillin-tazobactam (ZOSYN) IVPB 2.25 g  Status:  Discontinued     2.25 g 100 mL/hr over 30 Minutes Intravenous Every 8 hours 06/09/17 1729 06/10/17 0714   06/09/17 2200  piperacillin-tazobactam (ZOSYN) IVPB 3.375 g  Status:  Discontinued     3.375 g 12.5 mL/hr over 240 Minutes Intravenous Every 8 hours  06/09/17 1515 06/09/17 1634   06/09/17 1645  piperacillin-tazobactam (ZOSYN) IVPB 2.25 g  Status:  Discontinued     2.25 g 100 mL/hr over 30 Minutes Intravenous Every 8 hours 06/09/17 1634 06/09/17 1729   06/09/17 1515  DAPTOmycin (CUBICIN) 750 mg in sodium chloride 0.9 % IVPB  Status:  Discontinued     750 mg 230 mL/hr over 30 Minutes Intravenous Every 48 hours 06/09/17 1500 06/10/17 0734   06/08/2017 0930  ceFAZolin (ANCEF) IVPB 2g/100 mL premix     2 g 200 mL/hr over 30 Minutes Intravenous On call to O.R. 06/03/17 1214 06/12/2017 1006   06/02/17 2200  linezolid (ZYVOX) IVPB 600 mg  Status:  Discontinued    Comments:  Pharmacy may adjust dose prn   600 mg 300 mL/hr over 60 Minutes Intravenous Every 12 hours 06/02/17 1546 06/08/17 0915  06/02/17 1545  linezolid (ZYVOX) IVPB 600 mg  Status:  Discontinued     600 mg 300 mL/hr over 60 Minutes Intravenous Every 12 hours 06/02/17 1543 06/02/17 1546   05/31/17 1500  vancomycin (VANCOCIN) 1,250 mg in sodium chloride 0.9 % 250 mL IVPB  Status:  Discontinued     1,250 mg 166.7 mL/hr over 90 Minutes Intravenous Every 24 hours 05/21/2017 1409 06/03/17 0845   05/26/2017 2000  piperacillin-tazobactam (ZOSYN) IVPB 3.375 g  Status:  Discontinued     3.375 g 12.5 mL/hr over 240 Minutes Intravenous Every 8 hours 06/09/2017 1409 06/09/17 1515   05/21/2017 1300  vancomycin (VANCOCIN) 2,000 mg in sodium chloride 0.9 % 500 mL IVPB     2,000 mg 250 mL/hr over 120 Minutes Intravenous  Once 05/27/2017 1208 06/13/2017 1657   05/29/2017 1300  piperacillin-tazobactam (ZOSYN) IVPB 3.375 g     3.375 g 100 mL/hr over 30 Minutes Intravenous  Once 06/11/2017 1208 05/23/2017 1334   06/06/2017 0745  ceFAZolin (ANCEF) IVPB 2g/100 mL premix  Status:  Discontinued     2 g 200 mL/hr over 30 Minutes Intravenous On call to O.R. 05/20/2017 0736 06/16/2017 1143   06/09/2017 1800  ceFAZolin (ANCEF) IVPB 2g/100 mL premix  Status:  Discontinued     2 g 200 mL/hr over 30 Minutes Intravenous Every 8 hours  06/13/2017 1220 05/21/2017 1210   05/25/2017 0945  ceFAZolin (ANCEF) IVPB 2g/100 mL premix     2 g 200 mL/hr over 30 Minutes Intravenous To Surgery 05/27/17 2001 06/12/2017 1015   06/04/2017 2000  ceFAZolin (ANCEF) IVPB 2g/100 mL premix     2 g 200 mL/hr over 30 Minutes Intravenous Every 6 hours 06/07/2017 1643 05/24/17 1009   06/01/2017 1015  ceFAZolin (ANCEF) IVPB 2g/100 mL premix     2 g 200 mL/hr over 30 Minutes Intravenous On call to O.R. 06/11/2017 1014 05/24/2017 1400      Assessment/Plan: s/p Procedure(s): EXPLORATORY LAPAROTOMY REPAIR OF DUODENAL ULCER (N/A) UGI when stable   LOS: 23 days    Jash Wahlen A. 06/11/2017

## 2017-06-12 ENCOUNTER — Inpatient Hospital Stay (HOSPITAL_COMMUNITY): Payer: Medicare Other

## 2017-06-12 DIAGNOSIS — J9600 Acute respiratory failure, unspecified whether with hypoxia or hypercapnia: Secondary | ICD-10-CM

## 2017-06-12 DIAGNOSIS — I953 Hypotension of hemodialysis: Secondary | ICD-10-CM

## 2017-06-12 LAB — POCT I-STAT, CHEM 8
BUN: 15 mg/dL (ref 6–20)
BUN: 18 mg/dL (ref 6–20)
BUN: 14 mg/dL (ref 6–20)
BUN: 16 mg/dL (ref 6–20)
BUN: 13 mg/dL (ref 6–20)
BUN: 16 mg/dL (ref 6–20)
BUN: 11 mg/dL (ref 6–20)
BUN: 15 mg/dL (ref 6–20)
BUN: 10 mg/dL (ref 6–20)
BUN: 14 mg/dL (ref 6–20)
BUN: 10 mg/dL (ref 6–20)
BUN: 12 mg/dL (ref 6–20)
CALCIUM ION: 1.05 mmol/L — AB (ref 1.15–1.40)
CALCIUM ION: 0.68 mmol/L — AB (ref 1.15–1.40)
CALCIUM ION: 0.56 mmol/L — AB (ref 1.15–1.40)
CALCIUM ION: 1.27 mmol/L (ref 1.15–1.40)
CHLORIDE: 92 mmol/L — AB (ref 101–111)
CHLORIDE: 90 mmol/L — AB (ref 101–111)
CHLORIDE: 93 mmol/L — AB (ref 101–111)
CHLORIDE: 90 mmol/L — AB (ref 101–111)
CHLORIDE: 92 mmol/L — AB (ref 101–111)
CHLORIDE: 90 mmol/L — AB (ref 101–111)
CREATININE: 0.7 mg/dL (ref 0.44–1.00)
CREATININE: 0.9 mg/dL (ref 0.44–1.00)
CREATININE: 0.6 mg/dL (ref 0.44–1.00)
CREATININE: 0.8 mg/dL (ref 0.44–1.00)
CREATININE: 0.8 mg/dL (ref 0.44–1.00)
CREATININE: 0.8 mg/dL (ref 0.44–1.00)
Calcium, Ion: 0.69 mmol/L — CL (ref 1.15–1.40)
Calcium, Ion: 0.68 mmol/L — CL (ref 1.15–1.40)
Calcium, Ion: 1.02 mmol/L — ABNORMAL LOW (ref 1.15–1.40)
Calcium, Ion: 1.15 mmol/L (ref 1.15–1.40)
Calcium, Ion: 0.61 mmol/L — CL (ref 1.15–1.40)
Calcium, Ion: 1.3 mmol/L (ref 1.15–1.40)
Calcium, Ion: 0.59 mmol/L — CL (ref 1.15–1.40)
Calcium, Ion: 1.04 mmol/L — ABNORMAL LOW (ref 1.15–1.40)
Chloride: 92 mmol/L — ABNORMAL LOW (ref 101–111)
Chloride: 93 mmol/L — ABNORMAL LOW (ref 101–111)
Chloride: 92 mmol/L — ABNORMAL LOW (ref 101–111)
Chloride: 92 mmol/L — ABNORMAL LOW (ref 101–111)
Chloride: 91 mmol/L — ABNORMAL LOW (ref 101–111)
Chloride: 90 mmol/L — ABNORMAL LOW (ref 101–111)
Creatinine, Ser: 0.6 mg/dL (ref 0.44–1.00)
Creatinine, Ser: 0.9 mg/dL (ref 0.44–1.00)
Creatinine, Ser: 0.6 mg/dL (ref 0.44–1.00)
Creatinine, Ser: 0.5 mg/dL (ref 0.44–1.00)
Creatinine, Ser: 0.5 mg/dL (ref 0.44–1.00)
Creatinine, Ser: 0.6 mg/dL (ref 0.44–1.00)
GLUCOSE: 129 mg/dL — AB (ref 65–99)
GLUCOSE: 161 mg/dL — AB (ref 65–99)
GLUCOSE: 135 mg/dL — AB (ref 65–99)
GLUCOSE: 138 mg/dL — AB (ref 65–99)
GLUCOSE: 186 mg/dL — AB (ref 65–99)
GLUCOSE: 144 mg/dL — AB (ref 65–99)
GLUCOSE: 186 mg/dL — AB (ref 65–99)
GLUCOSE: 145 mg/dL — AB (ref 65–99)
Glucose, Bld: 154 mg/dL — ABNORMAL HIGH (ref 65–99)
Glucose, Bld: 127 mg/dL — ABNORMAL HIGH (ref 65–99)
Glucose, Bld: 161 mg/dL — ABNORMAL HIGH (ref 65–99)
Glucose, Bld: 186 mg/dL — ABNORMAL HIGH (ref 65–99)
HCT: 36 % (ref 36.0–46.0)
HCT: 40 % (ref 36.0–46.0)
HCT: 37 % (ref 36.0–46.0)
HCT: 36 % (ref 36.0–46.0)
HCT: 42 % (ref 36.0–46.0)
HCT: 31 % — ABNORMAL LOW (ref 36.0–46.0)
HCT: 42 % (ref 36.0–46.0)
HEMATOCRIT: 40 % (ref 36.0–46.0)
HEMATOCRIT: 36 % (ref 36.0–46.0)
HEMATOCRIT: 42 % (ref 36.0–46.0)
HEMATOCRIT: 35 % — AB (ref 36.0–46.0)
HEMATOCRIT: 41 % (ref 36.0–46.0)
HEMOGLOBIN: 12.2 g/dL (ref 12.0–15.0)
HEMOGLOBIN: 13.9 g/dL (ref 12.0–15.0)
Hemoglobin: 13.6 g/dL (ref 12.0–15.0)
Hemoglobin: 12.2 g/dL (ref 12.0–15.0)
Hemoglobin: 13.6 g/dL (ref 12.0–15.0)
Hemoglobin: 14.3 g/dL (ref 12.0–15.0)
Hemoglobin: 12.6 g/dL (ref 12.0–15.0)
Hemoglobin: 12.2 g/dL (ref 12.0–15.0)
Hemoglobin: 14.3 g/dL (ref 12.0–15.0)
Hemoglobin: 10.5 g/dL — ABNORMAL LOW (ref 12.0–15.0)
Hemoglobin: 14.3 g/dL (ref 12.0–15.0)
Hemoglobin: 11.9 g/dL — ABNORMAL LOW (ref 12.0–15.0)
POTASSIUM: 3.4 mmol/L — AB (ref 3.5–5.1)
POTASSIUM: 3.5 mmol/L (ref 3.5–5.1)
POTASSIUM: 3.5 mmol/L (ref 3.5–5.1)
POTASSIUM: 3.9 mmol/L (ref 3.5–5.1)
POTASSIUM: 3.5 mmol/L (ref 3.5–5.1)
POTASSIUM: 3.4 mmol/L — AB (ref 3.5–5.1)
POTASSIUM: 3.6 mmol/L (ref 3.5–5.1)
Potassium: 3.5 mmol/L (ref 3.5–5.1)
Potassium: 3.7 mmol/L (ref 3.5–5.1)
Potassium: 3.4 mmol/L — ABNORMAL LOW (ref 3.5–5.1)
Potassium: 3.6 mmol/L (ref 3.5–5.1)
Potassium: 3.5 mmol/L (ref 3.5–5.1)
SODIUM: 139 mmol/L (ref 135–145)
SODIUM: 137 mmol/L (ref 135–145)
SODIUM: 139 mmol/L (ref 135–145)
SODIUM: 138 mmol/L (ref 135–145)
SODIUM: 139 mmol/L (ref 135–145)
Sodium: 138 mmol/L (ref 135–145)
Sodium: 139 mmol/L (ref 135–145)
Sodium: 138 mmol/L (ref 135–145)
Sodium: 139 mmol/L (ref 135–145)
Sodium: 137 mmol/L (ref 135–145)
Sodium: 139 mmol/L (ref 135–145)
Sodium: 137 mmol/L (ref 135–145)
TCO2: 29 mmol/L (ref 22–32)
TCO2: 29 mmol/L (ref 22–32)
TCO2: 29 mmol/L (ref 22–32)
TCO2: 30 mmol/L (ref 22–32)
TCO2: 30 mmol/L (ref 22–32)
TCO2: 30 mmol/L (ref 22–32)
TCO2: 29 mmol/L (ref 22–32)
TCO2: 31 mmol/L (ref 22–32)
TCO2: 30 mmol/L (ref 22–32)
TCO2: 32 mmol/L (ref 22–32)
TCO2: 29 mmol/L (ref 22–32)
TCO2: 30 mmol/L (ref 22–32)

## 2017-06-12 LAB — CBC
HCT: 38.3 % (ref 36.0–46.0)
HEMOGLOBIN: 12.6 g/dL (ref 12.0–15.0)
MCH: 30.1 pg (ref 26.0–34.0)
MCHC: 32.9 g/dL (ref 30.0–36.0)
MCV: 91.4 fL (ref 78.0–100.0)
PLATELETS: 18 10*3/uL — AB (ref 150–400)
RBC: 4.19 MIL/uL (ref 3.87–5.11)
RDW: 16.2 % — ABNORMAL HIGH (ref 11.5–15.5)
WBC: 8.6 10*3/uL (ref 4.0–10.5)

## 2017-06-12 LAB — GLUCOSE, CAPILLARY
GLUCOSE-CAPILLARY: 117 mg/dL — AB (ref 65–99)
GLUCOSE-CAPILLARY: 123 mg/dL — AB (ref 65–99)
Glucose-Capillary: 118 mg/dL — ABNORMAL HIGH (ref 65–99)
Glucose-Capillary: 117 mg/dL — ABNORMAL HIGH (ref 65–99)
Glucose-Capillary: 147 mg/dL — ABNORMAL HIGH (ref 65–99)
Glucose-Capillary: 145 mg/dL — ABNORMAL HIGH (ref 65–99)

## 2017-06-12 LAB — RENAL FUNCTION PANEL
Albumin: 1.2 g/dL — ABNORMAL LOW (ref 3.5–5.0)
Anion gap: 7 (ref 5–15)
BUN: 13 mg/dL (ref 6–20)
CHLORIDE: 96 mmol/L — AB (ref 101–111)
CO2: 31 mmol/L (ref 22–32)
CREATININE: 0.82 mg/dL (ref 0.44–1.00)
Calcium: 9.8 mg/dL (ref 8.9–10.3)
GFR calc non Af Amer: >60 mL/min (ref >60)
Glucose, Bld: 141 mg/dL — ABNORMAL HIGH (ref 65–99)
Phosphorus: 2.1 mg/dL — ABNORMAL LOW (ref 2.5–4.6)
Potassium: 3.5 mmol/L (ref 3.5–5.1)
SODIUM: 134 mmol/L — AB (ref 135–145)

## 2017-06-12 LAB — COMPREHENSIVE METABOLIC PANEL
ALBUMIN: 1.3 g/dL — AB (ref 3.5–5.0)
ALK PHOS: 138 U/L — AB (ref 38–126)
ALT: 9 U/L — AB (ref 14–54)
AST: 27 U/L (ref 15–41)
Anion gap: 10 (ref 5–15)
BILIRUBIN TOTAL: 1.4 mg/dL — AB (ref 0.3–1.2)
BUN: 15 mg/dL (ref 6–20)
CALCIUM: 9.2 mg/dL (ref 8.9–10.3)
CO2: 32 mmol/L (ref 22–32)
Chloride: 96 mmol/L — ABNORMAL LOW (ref 101–111)
Creatinine, Ser: 0.83 mg/dL (ref 0.44–1.00)
GFR calc Af Amer: >60 mL/min (ref >60)
GFR calc non Af Amer: >60 mL/min (ref >60)
GLUCOSE: 131 mg/dL — AB (ref 65–99)
Potassium: 3.6 mmol/L (ref 3.5–5.1)
SODIUM: 138 mmol/L (ref 135–145)
TOTAL PROTEIN: 4.7 g/dL — AB (ref 6.5–8.1)

## 2017-06-12 LAB — PHOSPHORUS: Phosphorus: 1.2 mg/dL — ABNORMAL LOW (ref 2.5–4.6)

## 2017-06-12 LAB — MAGNESIUM: Magnesium: 2 mg/dL (ref 1.7–2.4)

## 2017-06-12 MED ORDER — POTASSIUM CHLORIDE 10 MEQ/50ML IV SOLN
10.0000 meq | INTRAVENOUS | Status: DC
  Administered 2017-06-12: 10 meq via INTRAVENOUS
  Filled 2017-06-12: qty 50

## 2017-06-12 MED ORDER — SODIUM PHOSPHATES 45 MMOLE/15ML IV SOLN
30.0000 mmol | Freq: Once | INTRAVENOUS | Status: AC
  Administered 2017-06-12: 30 mmol via INTRAVENOUS
  Filled 2017-06-12: qty 10

## 2017-06-12 MED ORDER — TRACE MINERALS CR-CU-MN-SE-ZN 10-1000-500-60 MCG/ML IV SOLN
INTRAVENOUS | Status: AC
  Administered 2017-06-12: 17:00:00 via INTRAVENOUS
  Filled 2017-06-12: qty 548.8

## 2017-06-12 MED ORDER — DOCUSATE SODIUM 50 MG/5ML PO LIQD
100.0000 mg | Freq: Two times a day (BID) | ORAL | Status: DC
  Administered 2017-06-12 – 2017-06-17 (×11): 100 mg via ORAL
  Filled 2017-06-12 (×11): qty 10

## 2017-06-12 MED ORDER — TRACE MINERALS CR-CU-MN-SE-ZN 10-1000-500-60 MCG/ML IV SOLN
INTRAVENOUS | Status: DC
  Filled 2017-06-12: qty 548.8

## 2017-06-12 MED ORDER — FENTANYL CITRATE (PF) 100 MCG/2ML IJ SOLN
50.0000 ug | INTRAMUSCULAR | Status: DC | PRN
  Administered 2017-06-12 (×2): 100 ug via INTRAVENOUS
  Administered 2017-06-13: 50 ug via INTRAVENOUS
  Filled 2017-06-12 (×3): qty 2

## 2017-06-12 MED ORDER — SODIUM CHLORIDE 0.9 % IV SOLN: INTRAVENOUS | Status: DC | PRN

## 2017-06-12 NOTE — Progress Notes (Signed)
8 Days Post-Op   Subjective/Chief Complaint: Still requiring vent and pressor support - difficult to accurately assess BP to wean Levophed - may need a-line BM yesterday On CRRT Awake on vent - seems comfortable    Objective: Vital signs in last 24 hours: Temp:  [95.6 F (35.3 C)-97 F (36.1 C)] 96.4 F (35.8 C) (10/24 2356) Pulse Rate:  [47-77] 50 (10/25 0742) Resp:  [13-30] 15 (10/25 0742) BP: (47-180)/(33-162) 76/63 (10/25 0730) SpO2:  [88 %-100 %] 100 % (10/25 0742) FiO2 (%):  [40 %] 40 % (10/25 0742) Weight:  [114 kg (251 lb 5.2 oz)] 114 kg (251 lb 5.2 oz) (10/25 0500) Last BM Date: 06/11/17  Intake/Output from previous day: 10/24 0701 - 10/25 0700 In: 3813.3 [I.V.:3245.3; IV Piggyback:568] Out: 1517 [Drains:285] Intake/Output this shift: No intake/output data recorded.  WDWN in NAD - intubated, awake Abd - soft, minimal incisional tenderness Wound - fairly moist on surrounding skin; dressing changed; clean JP - old blood - only 285 cc total output yesterday  Lab Results:   Recent Labs  06/10/17 1742  06/11/17 0830  06/12/17 0430 06/12/17 0438  WBC 11.6*  --  7.2  --   --   --   HGB 12.4  < > 11.5*  < > 12.2 13.6  HCT 38.4  < > 35.6*  < > 36.0 40.0  PLT 34*  --  24*  --   --   --   < > = values in this interval not displayed. BMET  Recent Labs  06/11/17 1600  06/12/17 0423 06/12/17 0430 06/12/17 0438  NA 139  < > 138 138 139  K 3.4*  < > 3.6 3.5 3.5  CL 97*  < > 96* 92* 92*  CO2 29  --  32  --   --   GLUCOSE 217*  < > 131* 127* 161*  BUN 17  < > 15 16 14   CREATININE 0.90  < > 0.83 0.80 0.60  CALCIUM 8.3*  --  9.2  --   --   < > = values in this interval not displayed. PT/INR No results for input(s): LABPROT, INR in the last 72 hours. ABG  Recent Labs  06/11/17 1244 06/11/17 1638  HCO3 28.2* 29.3*    Studies/Results: Dg Chest Port 1 View  Result Date: 06/10/2017 CLINICAL DATA:  Catheter insertion.  Hypoxia. EXAM: PORTABLE CHEST 1  VIEW COMPARISON:  Study obtained earlier in the day FINDINGS: Dialysis catheter tip is in the superior vena cava. Right jugular catheter tip is in the superior vena cava. Endotracheal tube tip is 3.6 cm above the carina. Nasogastric tube tip and side port are below the diaphragm. No pneumothorax. There are bilateral pleural effusions. There is interstitial and alveolar edema, stable. There is airspace consolidation in the right lower lobe with volume loss. There is also perihilar airspace opacity. There is cardiomegaly with pulmonary venous hypertension. No evident adenopathy. No bone lesions. There is aortic atherosclerosis. IMPRESSION: Dialysis catheter tip in superior vena cava. Other tubes and catheters as described. No pneumothorax. Cardiomegaly with pulmonary venous hypertension. Pulmonary edema and pleural effusions remain. Airspace consolidation right lower lobe and left perihilar regions raise concern for superimposed pneumonia. The appearance of the lungs and cardiac silhouette is stable compared to earlier in the day. There is aortic atherosclerosis. Aortic Atherosclerosis (ICD10-I70.0). Electronically Signed   By: Lowella Grip III M.D.   On: 06/10/2017 12:55    Anti-infectives: Anti-infectives    Start  Dose/Rate Route Frequency Ordered Stop   06/11/17 1515  DAPTOmycin (CUBICIN) 1,000 mg in sodium chloride 0.9 % IVPB     1,000 mg 240 mL/hr over 30 Minutes Intravenous Every 48 hours 06/10/17 0734     06/10/17 1500  piperacillin-tazobactam (ZOSYN) IVPB 3.375 g     3.375 g 100 mL/hr over 30 Minutes Intravenous Every 6 hours 06/10/17 1017     06/10/17 1200  piperacillin-tazobactam (ZOSYN) IVPB 3.375 g  Status:  Discontinued     3.375 g 12.5 mL/hr over 240 Minutes Intravenous Every 6 hours 06/10/17 0718 06/10/17 0719   06/10/17 1200  piperacillin-tazobactam (ZOSYN) IVPB 3.375 g  Status:  Discontinued     3.375 g 100 mL/hr over 30 Minutes Intravenous Every 6 hours 06/10/17 0720  06/10/17 1017   06/09/17 2300  piperacillin-tazobactam (ZOSYN) IVPB 2.25 g  Status:  Discontinued     2.25 g 100 mL/hr over 30 Minutes Intravenous Every 8 hours 06/09/17 1729 06/10/17 0714   06/09/17 2200  piperacillin-tazobactam (ZOSYN) IVPB 3.375 g  Status:  Discontinued     3.375 g 12.5 mL/hr over 240 Minutes Intravenous Every 8 hours 06/09/17 1515 06/09/17 1634   06/09/17 1645  piperacillin-tazobactam (ZOSYN) IVPB 2.25 g  Status:  Discontinued     2.25 g 100 mL/hr over 30 Minutes Intravenous Every 8 hours 06/09/17 1634 06/09/17 1729   06/09/17 1515  DAPTOmycin (CUBICIN) 750 mg in sodium chloride 0.9 % IVPB  Status:  Discontinued     750 mg 230 mL/hr over 30 Minutes Intravenous Every 48 hours 06/09/17 1500 06/10/17 0734   05/22/2017 0930  ceFAZolin (ANCEF) IVPB 2g/100 mL premix     2 g 200 mL/hr over 30 Minutes Intravenous On call to O.R. 06/03/17 1214 06/16/2017 1006   06/02/17 2200  linezolid (ZYVOX) IVPB 600 mg  Status:  Discontinued    Comments:  Pharmacy may adjust dose prn   600 mg 300 mL/hr over 60 Minutes Intravenous Every 12 hours 06/02/17 1546 06/08/17 0915   06/02/17 1545  linezolid (ZYVOX) IVPB 600 mg  Status:  Discontinued     600 mg 300 mL/hr over 60 Minutes Intravenous Every 12 hours 06/02/17 1543 06/02/17 1546   05/31/17 1500  vancomycin (VANCOCIN) 1,250 mg in sodium chloride 0.9 % 250 mL IVPB  Status:  Discontinued     1,250 mg 166.7 mL/hr over 90 Minutes Intravenous Every 24 hours 06/11/2017 1409 06/03/17 0845   06/08/2017 2000  piperacillin-tazobactam (ZOSYN) IVPB 3.375 g  Status:  Discontinued     3.375 g 12.5 mL/hr over 240 Minutes Intravenous Every 8 hours 05/19/2017 1409 06/09/17 1515   06/13/2017 1300  vancomycin (VANCOCIN) 2,000 mg in sodium chloride 0.9 % 500 mL IVPB     2,000 mg 250 mL/hr over 120 Minutes Intravenous  Once 05/29/2017 1208 06/08/2017 1657   05/28/2017 1300  piperacillin-tazobactam (ZOSYN) IVPB 3.375 g     3.375 g 100 mL/hr over 30 Minutes Intravenous   Once 05/24/2017 1208 06/07/2017 1334   05/22/2017 0745  ceFAZolin (ANCEF) IVPB 2g/100 mL premix  Status:  Discontinued     2 g 200 mL/hr over 30 Minutes Intravenous On call to O.R. 05/21/2017 0736 06/10/2017 1143   06/01/2017 1800  ceFAZolin (ANCEF) IVPB 2g/100 mL premix  Status:  Discontinued     2 g 200 mL/hr over 30 Minutes Intravenous Every 8 hours 06/10/2017 1220 06/18/2017 1210   05/22/2017 0945  ceFAZolin (ANCEF) IVPB 2g/100 mL premix     2 g  200 mL/hr over 30 Minutes Intravenous To Surgery 05/27/17 2001 05/27/2017 1015   05/31/2017 2000  ceFAZolin (ANCEF) IVPB 2g/100 mL premix     2 g 200 mL/hr over 30 Minutes Intravenous Every 6 hours 06/18/2017 1643 05/24/17 1009   05/28/2017 1015  ceFAZolin (ANCEF) IVPB 2g/100 mL premix     2 g 200 mL/hr over 30 Minutes Intravenous On call to O.R. 06/04/2017 1014 06/13/2017 1400      Assessment/Plan: Active Problems:   Acute on chronic diastolic CHF (congestive heart failure) (HCC)   Hypotension   Elevated troponin   Atherosclerosis of native arteries of extremities with gangrene, left leg (HCC)   Traumatic hematoma of left lower leg   Necrotizing fasciitis of lower leg (HCC)   Pressure injury of skin   Hemorrhagic shock (HCC)   Acute hypoxemic respiratory failure (HCC)   AKI (acute kidney injury) ([place])   UGIB (upper gastrointestinal bleed)   58F hx of HFpEF, nec fasciitis of LLE, HLD, pHTN, CKD III, COPD on home O2 (2.5L) - was being managed by ortho for LLE wound and postop developed massive GIB - found to have DU that likely had eroded through GDA -   Bleeding duodenal ulcer and hemorrhagic shock - S/P Exploratory laparotomy with pyloroplasty and oversewing bleeding duodenal ulcer, Dr. Brantley Stage, 10/17 - Flush NG tube with saline, low NGT output  - Hgb 13.6 today, stable - If rebleed occurs, plan will be for CT angiogram abdomen/pelvis, with potential IR for angiographic embolization - JP drain old bloody output, monitor - still requiring levo - may need  arterial line - weaning off vent per CCM  FEN: NPO, NGT/ TNA VTE: SCD's, no chemical prophylaxis in setting of GIB ID: Zosyn 10/12>>   Zyvox 10/15>> - Surgical wound culture 10/10 >> E coli, Pseudomonas, Morganella morganii, proteus, VRE Foley: yes, oliguric, AKI in setting of CKD Follow up: TBD  DISPO: -Will place wound vac to control serous drainage from midline wound Will need upper GI when stable - before able to start enteric feeds    LOS: 24 days    Justan Gaede K. 06/12/2017

## 2017-06-12 NOTE — Procedures (Signed)
Arterial Catheter Insertion Procedure Note [name] [id number] [birth year]  Procedure: Insertion of Arterial Catheter  Indications: Blood pressure monitoring  Procedure Details Consent: Risks of procedure as well as the alternatives and risks of each were explained to the (patient/caregiver).  Consent for procedure obtained. Time Out: Verified patient identification, verified procedure, site/side was marked, verified correct patient position, special equipment/implants available, medications/allergies/relevent history reviewed, required imaging and test results available.  Performed Real-time ultrasound was used to identify cannulate the vessel Maximum sterile technique was used including antiseptics, cap, gloves, gown, hand hygiene, mask and sheet. Skin prep: Chlorhexidine; local anesthetic administered 20 gauge catheter was inserted into right femoral artery using the Seldinger technique.  Evaluation Blood flow good; BP tracing good. Complications: No apparent complications   Erick Colace ACNP-BC [place] Pager # [phone] OR # [phone] if no answer .   [name] 06/12/2017

## 2017-06-12 NOTE — Progress Notes (Signed)
S: Still intubated today, nodding to questions and eyes open and tracking.   O:BP (!) 123/101   Pulse (!) 56   Temp (!) 97.4 F (36.3 C) (Oral)   Resp 15   Ht 5' 2.99" (1.6 m)   Wt 251 lb 5.2 oz (114 kg)   SpO2 92%   BMI 44.53 kg/m   Intake/Output Summary (Last 24 hours) at 06/12/17 1002 Last data filed at 06/12/17 1000  Gross per 24 hour  Intake          3564.04 ml  Output             4217 ml  Net          -652.96 ml   Intake/Output: I/O last 3 completed shifts: In: 5963.4 [I.V.:5295.4; IV Piggyback:668] Out: 7036 [Emesis/NG output:150; Drains:1485; Other:5401]  Intake/Output this shift:  Total I/O In: 541.9 [I.V.:362.9; IV Piggyback:179] Out: 314 [Other:314] Weight change:  Gen: no acute distress, biting on ETT  CVS: RRR  GR2/6 M Resp: lungs clear over anterior lung fields decreased bs, rales in bases Abd: no bowel sounds, non tender, dressing over lap incision sites  Ext: 2+ hand and lower extremity pitting edema, soft edema in her abdomen    Recent Labs Lab 06/08/17 0550  06/09/17 0500 06/09/17 1500 06/09/17 2220 06/10/17 0555  06/10/17 1700  06/11/17 0431  06/11/17 1600  06/12/17 0121 06/12/17 0423 06/12/17 0430 06/12/17 0438 06/12/17 0829 06/12/17 0838 06/12/17 0856  NA 139  < > 141 141 142 142  < > 139  < > 139  < > 139  < > 138 138 138 139 139 134* 137  K 3.8  < > 3.9 3.9 3.9 3.8  < > 3.7  < > 3.1*  < > 3.4*  < > 3.4* 3.6 3.5 3.5 3.7 6.5* 3.9  CL 107  < > 109 107 108 107  --  104  --  99*  --  97*  < > 93* 96* 92* 92* 91* 94* 92*  CO2 26  < > '25 24 23 22  ' --  23  --  28  --  29  --   --  32  --   --   --   --   --   GLUCOSE 87  < > 77 84 93 109*  --  188*  --  231*  --  217*  < > 129* 131* 127* 161* 161* 141* 135*  BUN 52*  < > 56* 59* 61* 63*  --  54*  --  36*  --  17  < > '18 15 16 14 13 18 16  ' CREATININE 2.59*  < > 2.94* 3.03* 3.14* 3.22*  --  2.73*  --  1.79*  --  0.90  < > 0.90 0.83 0.80 0.60 0.60 0.80 0.90  ALBUMIN  --   --  1.4*  --   --  1.4*   --  1.4*  --  1.2*  --  1.5*  --   --  1.3*  --   --   --   --   --   CALCIUM 7.6*  < > 7.7* 8.1* 8.0* 8.0*  --  8.4*  --  8.8*  --  8.3*  --   --  9.2  --   --   --   --   --   PHOS 4.2  --  4.8*  --   --  5.1*  --  4.2  --  2.3*  --  1.8*  --   --  1.2*  --   --   --   --   --   AST  --   --  121*  --   --  49*  --   --   --   --   --   --   --   --  27  --   --   --   --   --   ALT  --   --  19  --   --  11*  --   --   --   --   --   --   --   --  9*  --   --   --   --   --   < > = values in this interval not displayed. Liver Function Tests:  Recent Labs Lab 06/09/17 0500 06/10/17 0555  06/11/17 0431 06/11/17 1600 06/12/17 0423  AST 121* 49*  --   --   --  27  ALT 19 11*  --   --   --  9*  ALKPHOS 130* 139*  --   --   --  138*  BILITOT 1.6* 1.6*  --   --   --  1.4*  PROT 4.2* 4.2*  --   --   --  4.7*  ALBUMIN 1.4* 1.4*  < > 1.2* 1.5* 1.3*  < > = values in this interval not displayed. No results for input(s): LIPASE, AMYLASE in the last 168 hours. No results for input(s): AMMONIA in the last 168 hours. CBC:  Recent Labs Lab 06/09/17 0500 06/10/17 0555  06/10/17 1742  06/11/17 0830  06/12/17 0823 06/12/17 0829 06/12/17 0838 06/12/17 0856  WBC 19.9* 14.8*  --  11.6*  --  7.2  --  8.6  --   --   --   NEUTROABS  --  13.4*  --   --   --   --   --   --   --   --   --   HGB 11.3* 11.7*  < > 12.4  < > 11.5*  < > 12.6 14.3 11.9* 12.6  HCT 35.1* 37.6  < > 38.4  < > 35.6*  < > 38.3 42.0 35.0* 37.0  MCV 92.1 94.0  --  94.1  --  92.7  --  91.4  --   --   --   PLT 48* 46*  --  34*  --  24*  --  PENDING  --   --   --   < > = values in this interval not displayed. Cardiac Enzymes:  Recent Labs Lab 06/05/17 1208 06/10/17 0555  CKTOTAL  --  <5*  TROPONINI 0.38*  --    CBG:  Recent Labs Lab 06/11/17 1550 06/11/17 2043 06/11/17 2356 06/12/17 0425 06/12/17 0753  GLUCAP 156* 121* 125* 118* 117*    Iron Studies: No results for input(s): IRON, TIBC, TRANSFERRIN, FERRITIN in  the last 72 hours. Studies/Results: Dg Chest Port 1 View  Result Date: 06/10/2017 CLINICAL DATA:  Catheter insertion.  Hypoxia. EXAM: PORTABLE CHEST 1 VIEW COMPARISON:  Study obtained earlier in the day FINDINGS: Dialysis catheter tip is in the superior vena cava. Right jugular catheter tip is in the superior vena cava. Endotracheal tube tip is 3.6 cm above the carina. Nasogastric tube tip and side port are below the diaphragm. No pneumothorax. There are bilateral pleural effusions. There  is interstitial and alveolar edema, stable. There is airspace consolidation in the right lower lobe with volume loss. There is also perihilar airspace opacity. There is cardiomegaly with pulmonary venous hypertension. No evident adenopathy. No bone lesions. There is aortic atherosclerosis. IMPRESSION: Dialysis catheter tip in superior vena cava. Other tubes and catheters as described. No pneumothorax. Cardiomegaly with pulmonary venous hypertension. Pulmonary edema and pleural effusions remain. Airspace consolidation right lower lobe and left perihilar regions raise concern for superimposed pneumonia. The appearance of the lungs and cardiac silhouette is stable compared to earlier in the day. There is aortic atherosclerosis. Aortic Atherosclerosis (ICD10-I70.0). Electronically Signed   By: Lowella Grip III M.D.   On: 06/10/2017 12:55   . brimonidine  1 drop Right Eye BID  . brinzolamide  1 drop Both Eyes BID  . chlorhexidine gluconate (MEDLINE KIT)  15 mL Mouth Rinse BID  . Chlorhexidine Gluconate Cloth  6 each Topical Daily  . docusate  100 mg Oral BID  . Gerhardt's butt cream   Topical BID  . insulin aspart  0-15 Units Subcutaneous Q4H  . latanoprost  1 drop Right Eye QHS  . mouth rinse  15 mL Mouth Rinse QID  . pantoprazole (PROTONIX) IV  40 mg Intravenous Q12H  . sodium chloride flush  3 mL Intravenous Q12H  . timolol  1 drop Both Eyes BID    BMET    Component Value Date/Time   NA 137 06/12/2017  0856   K 3.9 06/12/2017 0856   CL 92 (L) 06/12/2017 0856   CO2 32 06/12/2017 0423   GLUCOSE 135 (H) 06/12/2017 0856   BUN 16 06/12/2017 0856   CREATININE 0.90 06/12/2017 0856   CREATININE 1.10 (H) 01/31/2016 1118   CALCIUM 9.2 06/12/2017 0423   GFRNONAA >60 06/12/2017 0423   GFRAA >60 06/12/2017 0423   CBC    Component Value Date/Time   WBC 8.6 06/12/2017 0823   RBC 4.19 06/12/2017 0823   HGB 12.6 06/12/2017 0856   HCT 37.0 06/12/2017 0856   PLT PENDING 06/12/2017 0823   MCV 91.4 06/12/2017 0823   MCH 30.1 06/12/2017 0823   MCHC 32.9 06/12/2017 0823   RDW 16.2 (H) 06/12/2017 0823   LYMPHSABS 0.8 06/10/2017 0555   MONOABS 0.5 06/10/2017 0555   EOSABS 0.1 06/10/2017 0555   BASOSABS 0.0 06/10/2017 0555   Assessment/Plan:  1. AKI on CKD III (baseline crt 0.8) secondary to acute hemorrhagic shock. Continue CRRT for slow volume removal and solute control. Will need more accurate recording of volume being removed today. Still no urine output and foley was discontinued today. She should have intermittent bladder scanning.  Good solute/K/acid base.  Vol xs , not doing well with removal as ordered.  Needs vol off.  1. Follow up urinalysis when she urinates  2. Refeeding syndrome - will have phos added to TPA  3. Acute GI bleed - hemoglobin improving, less NG tube output today  4. Hypotension - on levophed, rate continues to taper but blood pressure recordings have not been accurate, PCCM considering A line  5. Thrombocytopenia - result pending for today, question whether this could be drug induced related to zosyn 6. pulm HTN- PCCM attempting SBT, pulmonary edema should be improving so hopefully extubate soon  7. Necrotizing fascitis - on zosyn and daptomycin orthopedics recommending AKA 8. Paroxysmal atrial fibrillation - NSR on exam today, holding AC for GIB  9. Shock liver, resolved  10. CAD  50. Morbid Obesity  12. VDRF  hopefully wean.  Ledell Noss, MD  I have seen and examined  this patient and agree with the plan of care seen, eval, examined ,discussed with resident and with staff. .  Shazia Mitchener L 06/12/2017, 10:57 AM

## 2017-06-12 NOTE — Consult Note (Signed)
[place] Nurse wound consult note Reason for Consult: placement of NPWT dressing to midline abdominal wound Wound type: surgical wound Pressure Injury POA: NA Measurement: 12cm x 4cm x 4cm  Wound bed: subcutaneous tissue, moist Drainage (amount, consistency, odor) heavy, serous drainage NPWT being used at this time to control massive serous drainage and keep the patient dryer Periwound: intact  Dressing procedure/placement/frequency: 1pc of black foam used to fill the wound bed, seal obtained at 162mmHG.   Patient on the vent, premedicated prior to dressing change.   Uncomplicated VAC dressing change, ok for bedside nurse to change T/Th/Sat.  Bedside nurse agreed with this  Discussed POC with patient and bedside nurse.  Re consult if needed, will not follow at this time. Thanks  Dmiyah Liscano [employer], RN,CWOCN, CNS, [place] [phone])

## 2017-06-12 NOTE — Progress Notes (Signed)
PHARMACY - ADULT TOTAL PARENTERAL NUTRITION CONSULT NOTE   Pharmacy Consult:  TPN Indication:  Prolonged NPO status  Patient Measurements: Height: 5' 2.99" (160 cm) Weight: 251 lb 5.2 oz (114 kg) IBW/kg (Calculated) : 52.38 TPN AdjBW (KG): 64 Body mass index is 44.53 kg/m.  Assessment:  88 YOF presented on 06/16/2017 with CHF exacerbation, hypotension and elevated troponins.  PMH significant for AFib, dHF, CKD3, GERD, HLD, CAD, and pulmonary HTN.  Patient has gangrenous left thigh/leg and underwent multiple I&Ds.  She required transfer to the ICU after the I&D on 06/16/2017 d/t hypotension from duodenal ulcer bleeding.  Patient underwent ex-lap with pyloroplasty and oversewing of the bleeding ulcer on the same day.  Per charting, patient was on a carb modified diet through 06/03/17.  He has been NPO x6 days with anticipated prolonged NPO status; therefore, Pharmacy consulted to manage TPN.  GI: GIB on IV PPI BID, drains O/P 231mL.  ?BL prealbumin >60.  UGI when stable. Endo: no hx DM - CBGs low-low normal prior TPN, then elevated, now controlled with less CHO in customized TPN Insulin requirements in the past 24 hours: 13 units SSI Lytes: ?refeeding.  K+ 3.5, CL low at 92 (max CL in TPN), Phos low at 1.2 (NaPhos 30 mmol ordered), iCa 0.68-1.05 (Ca gluconate through CRRT).   Renal: CKD3 - CRRT started 10/23 - SCr down to 0.6, BUN normalized - no UOP, net +17.3L since admit, D5NS at 20 ml/hr.  Pulm: intubated 10/17, FiO2 40% Cards: s/p code blue, MAP 60s, brady, CVP 7, in Afib - Levophed gtt AC: Eliquis for Afib held since 9/23 d/t LLE hematoma - H/H WNL, plts 258>24K Ortho: gangrenous left thigh/leg s/p I&D 10/5, 10/10, 10/12, 10/17.  Needs AKA. Hepatobil: LFTs improving, tbili down to 1.4 (no jaundice) Neuro: PRN Fentanyl - GCS 15, RASS at goal 0 ID: Zosyn/Cubicin for infected leg wound.  Possible PNA on CXR - afebrile, WBC down to 14.8 Best Practices: CHG TPN Access: CVC placed  06/05/17 TPN start date: 06/09/17  Nutritional Goals (per RD rec on 10/19): 3419-6222 kCal and 130gm protein per day  Current Nutrition:  TPN   Plan:  - Customized TPN (AA/CHO: 98/16) at 35 ml/hr (goal rate ~55 ml/hr), no electrolytes except NaPhos per Renal's request.  Today's TPN will provide an additional 48mmol NaPhos starting at 1800.  Advance to goal when lytes improve. - Hold 20% ILE for the first 7 days of TPN in ICU patients per ASPEN/SCCM guidelines (start date 06/16/17) - TPN providing 82gm of protein and 134g of CHO = 786 kCal, meeting ~60% of total needs. - Daily multivitamin and trace elements in TPN  - Continue moderate SSI Q4H (anticipate increasing TPN rate in AM, pending lytes) - KCL x 4 runs (only received one and Renal d/c'ed) - F/U daily, abx adjustment if +PNA    Zorina Mallin D. Mina Marble, PharmD, BCPS Pager:  [phone] 06/12/2017, 8:37 AM

## 2017-06-12 NOTE — Progress Notes (Signed)
Placed patient back on PRVC per weaning protocol to rest. Patient tolerating well, placed on documented settings. RCP will continue to monitor.

## 2017-06-12 NOTE — Progress Notes (Signed)
PHARMACY - ADULT TOTAL PARENTERAL NUTRITION CONSULT NOTE   Pharmacy Consult:  TPN Indication:  Prolonged NPO status  Patient Measurements: Height: 5' 2.99" (160 cm) Weight: 251 lb 5.2 oz (114 kg) IBW/kg (Calculated) : 52.38 TPN AdjBW (KG): 64 Body mass index is 44.53 kg/m.  Assessment:  84 YOF presented on 06/14/2017 with CHF exacerbation, hypotension and elevated troponins.  PMH significant for AFib, dHF, CKD3, GERD, HLD, CAD, and pulmonary HTN.  Patient has gangrenous left thigh/leg and underwent multiple I&Ds.  She required transfer to the ICU after the I&D on 06/10/2017 d/t hypotension from duodenal ulcer bleeding.  Patient underwent ex-lap with pyloroplasty and oversewing of the bleeding ulcer on the same day.  Per charting, patient was on a carb modified diet through 06/03/17.  He has been NPO x6 days with anticipated prolonged NPO status; therefore, Pharmacy consulted to manage TPN.  GI: GIB on IV PPI BID, drains O/P 229mL.  ?BL prealbumin >60.  UGI when stable. Endo: no hx DM - CBGs low-low normal prior TPN, then elevated, now controlled with less CHO in customized TPN Insulin requirements in the past 24 hours: 13 units SSI Lytes: ?refeeding.  Keep lytes out of TPN per Renal.  K+ 3.5, CL low at 92 (unable to max CL in TPN), Phos low at 1.2 (NaPhos 30 mmol ordered), iCa 0.68-1.05 (Ca gluconate through CRRT).   Renal: CKD3 - CRRT started 10/23 - SCr down to 0.6, BUN normalized - no UOP, net +17.3L since admit, D5NS at 20 ml/hr.  Pulm: intubated 10/17, FiO2 40% Cards: s/p code blue, MAP 60s, brady, CVP 7, in Afib - Levophed gtt AC: Eliquis for Afib held since 9/23 d/t LLE hematoma - H/H WNL, plts 258>24K Ortho: gangrenous left thigh/leg s/p I&D 10/5, 10/10, 10/12, 10/17.  Needs AKA. Hepatobil: LFTs improving, tbili down to 1.4 (no jaundice) Neuro: PRN Fentanyl - GCS 15, RASS at goal 0 ID: Zosyn/Cubicin for infected leg wound.  Possible PNA on CXR - afebrile, WBC down to 14.8 Best  Practices: CHG TPN Access: CVC placed 06/05/17 TPN start date: 06/09/17  Nutritional Goals (per RD rec on 10/19): 9163-8466 kCal and 130gm protein per day  Current Nutrition:  TPN   Plan:  - Customized TPN (AA/CHO: 98/16) at 35 ml/hr (goal rate ~55 ml/hr), no electrolytes per Renal's request.  Advance to goal when lytes improve. - Hold 20% ILE for the first 7 days of TPN in ICU patients per ASPEN/SCCM guidelines (start date 06/16/17) - TPN providing 82gm of protein and 134g of CHO = 786 kCal, meeting ~60% of total needs. - Daily multivitamin and trace elements in TPN  - Continue moderate SSI Q4H (anticipate increasing TPN rate in AM, pending lytes) - KCL x 4 runs - F/U daily, abx adjustment if +PNA   [name], PharmD, BCPS Pager:  [phone] 06/12/2017, 7:10 AM

## 2017-06-12 NOTE — Progress Notes (Addendum)
PULMONARY / CRITICAL CARE MEDICINE   Name: [name] MRN: [id number] DOB: [birth year]    ADMISSION DATE:  06/09/2017 CONSULTATION DATE:  06/14/2017  REFERRING MD:  Dr. Sherral Hammers  CHIEF COMPLAINT:  Acute hypotension  HISTORY OF PRESENT ILLNESS:   82 year old female with history of HFpEF, necrotizing fasciitis of left lower leg, HLD, tricuspid regurg, pulmonary hypertension, CKD III, COPD on 2.5L at night presents with hypotension requiring pressor support after repeat left leg wound debridement earlier today (10/17). Patient was in her usual state of health until 9/22 when she presented for traumatic left leg hematoma and hypovolemic shock. She was admitted to the hospital 05/10/17-05/16/2017 and subsequently discharged to SNF where she experienced worsening weakness. She was then readmitted with acute on chronic HFpEF exacerbation on 10/1.  During this hospital stay patient continued to have hemodynamic instability requiring 8 units PRBC over her hospital course. She has been taken back to the OR  On 10/5, 10/10, 10/12, and 10/17 for further wound debridement. Today, after her procedure was completed and wound vac in place, she was in PACU where her blood pressure was noted to drop to 26S systolic. She was given albumin. Neo was initiated. Patient indicated she was dizzy and lightheaded. 3u PRBC were administered. PCCM was called for consult.  Of note patient had grossly bloody stool in the PACU with her hypotensive episode. Bowel movement was described as formed but grossly bloody/in a pool of blood. She denies prior episodes of melena or hematochezia. She endorses being constipated over past few days.   SUBJECTIVE: BP reads may be inaccurate. Ortho planning left AKA.  VITAL SIGNS: BP (!) 180/162   Pulse 66   Temp (!) 96.4 F (35.8 C) (Axillary) Comment: notified nurse  Resp 15   Ht 5' 2.99" (1.6 m)   Wt 251 lb 5.2 oz (114 kg)   SpO2 100%   BMI 44.53 kg/m   HEMODYNAMICS: CVP:  [5  mmHg-8 mmHg] 6 mmHg  VENTILATOR SETTINGS: Vent Mode: PRVC FiO2 (%):  [40 %] 40 % Set Rate:  [16 bmp] 16 bmp Vt Set:  [450 mL] 450 mL PEEP:  [5 cmH20] 5 cmH20 Plateau Pressure:  [14 cmH20-19 cmH20] 18 cmH20  INTAKE / OUTPUT: I/O last 3 completed shifts: In: 5893.4 [I.V.:5225.4; IV Piggyback:668] Out: 3419 [Emesis/NG output:150; Drains:1485; Other:5330]  PHYSICAL EXAMINATION: General:  Adult female, in NAD, bear hugger in place Neuro:  Awake, follows commands Cardiovascular:  RRR no m/r/g. ETT in place. Lungs:  CTA anteriorly, no W/R/R Abdomen:  Surgical dressing is clean and dry. Abdomen mildly distended and appropriately tender without rebound or guarding. JP drain with dark fluid. Musculoskeletal: left wound vac, leg wrapped, bandage clean and dry Skin:  No rashes or lesions  LABS:  BMET  Recent Labs Lab 06/11/17 0431  06/11/17 1600  06/12/17 0423 06/12/17 0430 06/12/17 0438  NA 139  < > 139  < > 138 138 139  K 3.1*  < > 3.4*  < > 3.6 3.5 3.5  CL 99*  --  97*  < > 96* 92* 92*  CO2 28  --  29  --  32  --   --   BUN 36*  --  17  < > 15 16 14   CREATININE 1.79*  --  0.90  < > 0.83 0.80 0.60  GLUCOSE 231*  --  217*  < > 131* 127* 161*  < > = values in this interval not displayed.  Electrolytes  Recent Labs Lab 06/10/17 0555  06/11/17 0431 06/11/17 1600 06/12/17 0423  CALCIUM 8.0*  < > 8.8* 8.3* 9.2  MG 1.8  --  1.8  --  2.0  PHOS 5.1*  < > 2.3* 1.8* 1.2*  < > = values in this interval not displayed.  CBC  Recent Labs Lab 06/10/17 0555  06/10/17 1742  06/11/17 0830  06/12/17 0121 06/12/17 0430 06/12/17 0438  WBC 14.8*  --  11.6*  --  7.2  --   --   --   --   HGB 11.7*  < > 12.4  < > 11.5*  < > 12.2 12.2 13.6  HCT 37.6  < > 38.4  < > 35.6*  < > 36.0 36.0 40.0  PLT 46*  --  34*  --  24*  --   --   --   --   < > = values in this interval not displayed.  Coag's  Recent Labs Lab 06/05/17 1225 06/05/17 2303 06/09/2017 0719 06/07/17 0500  06/08/17 0830 06/09/17 0500  APTT 36 34 33  --   --   --   INR 2.44 1.55 1.51 1.59 1.45 1.47    Sepsis Markers  Recent Labs Lab 06/05/17 1154 06/05/17 2252  LATICACIDVEN 6.2* 4.2*    ABG  Recent Labs Lab 06/07/17 0450 06/07/17 0845 06/08/17 0450  PHART 7.493* 7.405 7.349*  PCO2ART 33.4 42.2 47.7  PO2ART 81.5* 75.0* 92.1    Liver Enzymes  Recent Labs Lab 06/09/17 0500 06/10/17 0555  06/11/17 0431 06/11/17 1600 06/12/17 0423  AST 121* 49*  --   --   --  27  ALT 19 11*  --   --   --  9*  ALKPHOS 130* 139*  --   --   --  138*  BILITOT 1.6* 1.6*  --   --   --  1.4*  ALBUMIN 1.4* 1.4*  < > 1.2* 1.5* 1.3*  < > = values in this interval not displayed.  Cardiac Enzymes  Recent Labs Lab 06/05/17 1208  TROPONINI 0.38*    Glucose  Recent Labs Lab 06/11/17 0802 06/11/17 1145 06/11/17 1550 06/11/17 2043 06/11/17 2356 06/12/17 0425  GLUCAP 200* 178* 156* 121* 125* 118*    Imaging No results found.   STUDIES:  Renal US 10/22 > no lesions or obstructing stone.  CULTURES: Surgical wound culture 10/10 >> E coli, Pseudomonas, Morganella morganii, proteus, VRE  ANTIBIOTICS: 10/10 Ancef >> 10/12 10/15 Linezolid >> 10/21 10/12 Zosyn >> Daptomycin 10/22 >>   SIGNIFICANT EVENTS: 10/5 Left thigh and left leg debridement 10/10 Repeat irrigation and debridement left thigh and left leg 10/12 Debridement left leg and thigh 10/17 Debridement left thigh wound 10/17 EGD, exploratory laparotomy with bleeding duodenal ulcer/perforated duodenum. Intubated, maxed on 4 pressors in ICU. 10/23- cvvvhd  LINES/TUBES: PIV x3 Foley catheter Negative pressure wound L leg Closed system drain RLQ bulb CVC triple lumen NG/OG  DISCUSSION: 82 year old female with history of HFpEF, necrotizing fasciitis of left lower leg, HLD, tricuspid regurg, pulmonary hypertension, CKD III, COPD on 2L at night, presents after repeat left lower extremity debridement with acute  hypotension. Subsequently found to have duodenal ulcer with perforation, in hemorrhagic shock. Maxed on 4 pressors in ICU, intubated.   ASSESSMENT / PLAN:  GASTROINTESTINAL A:   Acute GI bleed 2/2 duodenal ulcer with hemorrhagic shock - s/p ex lap with repair 10/17 Shock liver; ALT 389, AST 2448 Nutrition P:   UGI series  once able per surgery PPI gtt Follow coags, CBC Continue TNA per pharm  PULMONARY A: Pulmonary Hypertension - [facility] 11/2015 with PAH 59/17 mmHg Mechanically ventilated P:   Holding home lasix, letaris Continue vent support SBT daily, goal 5/5 Hopeful extubation soon Bronchial hygiene CXR intermittently  CARDIOVASCULAR A:  HFpEF (Last echo 05/21/2017 indicates EF 60-65%, moderate MR, mod pulm HTN) with intermittent episodes of hypotension. Hypovolemic shock requiring massive blood transfusion Paroxysmal Atrial fibrillation CAD Elevated troponin P:  Levophed at 12 - BP reads may be innacurate No anticoagulation due to GIB Follow CVP Monitor intake/output  RENAL A:   AKI on CKD stage III (baseline Cr 0.8-1.1), worsening.  Continues to have anuria Hypokalemia Hypocalcemia P:   Continue CRRT per nephrology 1g Ca gluconate Monitor electrolytes Follow BMP q12hrs given anuria  HEMATOLOGIC A:   Hemorrhagic shock requiring massive blood transfusions  Acute blood loss anemia - due to above P:  Transfuse for Hgb < 8 CBC in AM  INFECTIOUS A:   Surgical wound culture 10/10 >> E coli, Pseudomonas, Morganella morganii, proteus, VRE Massive wound necrosis of LLE, will require amputation P:   S/p linezolid >> now on zosyn and daptomycin Follow wound cultures Ortho planning for left AKA once stabilized   ENDOCRINE A:   Hypoglycemia - resolved P:  Monitor glucose on BMP, CBG  NEUROLOGIC A:   Remains neurologically intact, no acute process  P:   Monitor mental status  FAMILY  - Inter-disciplinary family meet or Palliative Care meeting due by:   06/11/2017.  Everrett Coombe, MD PGY-2 [facility]

## 2017-06-13 ENCOUNTER — Inpatient Hospital Stay (HOSPITAL_COMMUNITY): Payer: Medicare Other

## 2017-06-13 LAB — POCT I-STAT 7, (LYTES, BLD GAS, ICA,H+H)
ACID-BASE EXCESS: 5 mmol/L — AB (ref 0.0–2.0)
Acid-Base Excess: 7 mmol/L — ABNORMAL HIGH (ref 0.0–2.0)
Acid-Base Excess: 6 mmol/L — ABNORMAL HIGH (ref 0.0–2.0)
Bicarbonate: 33.6 mmol/L — ABNORMAL HIGH (ref 20.0–28.0)
Bicarbonate: 33 mmol/L — ABNORMAL HIGH (ref 20.0–28.0)
Bicarbonate: 33.1 mmol/L — ABNORMAL HIGH (ref 20.0–28.0)
CALCIUM ION: 1.37 mmol/L (ref 1.15–1.40)
CALCIUM ION: 1.27 mmol/L (ref 1.15–1.40)
CALCIUM ION: 1.23 mmol/L (ref 1.15–1.40)
HCT: 33 % — ABNORMAL LOW (ref 36.0–46.0)
HEMATOCRIT: 32 % — AB (ref 36.0–46.0)
HEMATOCRIT: 33 % — AB (ref 36.0–46.0)
HEMOGLOBIN: 10.9 g/dL — AB (ref 12.0–15.0)
HEMOGLOBIN: 11.2 g/dL — AB (ref 12.0–15.0)
Hemoglobin: 11.2 g/dL — ABNORMAL LOW (ref 12.0–15.0)
O2 SAT: 98 %
O2 SAT: 99 %
O2 SAT: 99 %
PCO2 ART: 61.4 mmHg — AB (ref 32.0–48.0)
PH ART: 7.364 (ref 7.350–7.450)
PH ART: 7.337 — AB (ref 7.350–7.450)
PH ART: 7.337 — AB (ref 7.350–7.450)
PO2 ART: 126 mmHg — AB (ref 83.0–108.0)
POTASSIUM: 3.2 mmol/L — AB (ref 3.5–5.1)
POTASSIUM: 3.2 mmol/L — AB (ref 3.5–5.1)
POTASSIUM: 3.4 mmol/L — AB (ref 3.5–5.1)
SODIUM: 138 mmol/L (ref 135–145)
SODIUM: 138 mmol/L (ref 135–145)
Sodium: 138 mmol/L (ref 135–145)
TCO2: 35 mmol/L — AB (ref 22–32)
TCO2: 35 mmol/L — AB (ref 22–32)
TCO2: 35 mmol/L — AB (ref 22–32)
pCO2 arterial: 58.7 mmHg — ABNORMAL HIGH (ref 32.0–48.0)
pCO2 arterial: 61.1 mmHg — ABNORMAL HIGH (ref 32.0–48.0)
pO2, Arterial: 120 mmHg — ABNORMAL HIGH (ref 83.0–108.0)
pO2, Arterial: 128 mmHg — ABNORMAL HIGH (ref 83.0–108.0)

## 2017-06-13 LAB — POCT I-STAT EG7
ACID-BASE EXCESS: 5 mmol/L — AB (ref 0.0–2.0)
Acid-Base Excess: 5 mmol/L — ABNORMAL HIGH (ref 0.0–2.0)
Acid-Base Excess: 4 mmol/L — ABNORMAL HIGH (ref 0.0–2.0)
Acid-Base Excess: 4 mmol/L — ABNORMAL HIGH (ref 0.0–2.0)
Acid-Base Excess: 4 mmol/L — ABNORMAL HIGH (ref 0.0–2.0)
BICARBONATE: 33.3 mmol/L — AB (ref 20.0–28.0)
BICARBONATE: 33.4 mmol/L — AB (ref 20.0–28.0)
Bicarbonate: 31.9 mmol/L — ABNORMAL HIGH (ref 20.0–28.0)
Bicarbonate: 34.2 mmol/L — ABNORMAL HIGH (ref 20.0–28.0)
Bicarbonate: 32.5 mmol/L — ABNORMAL HIGH (ref 20.0–28.0)
CALCIUM ION: 0.72 mmol/L — AB (ref 1.15–1.40)
CALCIUM ION: 0.8 mmol/L — AB (ref 1.15–1.40)
Calcium, Ion: 0.94 mmol/L — ABNORMAL LOW (ref 1.15–1.40)
Calcium, Ion: 0.74 mmol/L — CL (ref 1.15–1.40)
Calcium, Ion: 0.72 mmol/L — CL (ref 1.15–1.40)
HCT: 34 % — ABNORMAL LOW (ref 36.0–46.0)
HCT: 35 % — ABNORMAL LOW (ref 36.0–46.0)
HCT: 39 % (ref 36.0–46.0)
HCT: 38 % (ref 36.0–46.0)
HEMATOCRIT: 40 % (ref 36.0–46.0)
HEMOGLOBIN: 13.6 g/dL (ref 12.0–15.0)
Hemoglobin: 11.6 g/dL — ABNORMAL LOW (ref 12.0–15.0)
Hemoglobin: 11.9 g/dL — ABNORMAL LOW (ref 12.0–15.0)
Hemoglobin: 13.3 g/dL (ref 12.0–15.0)
Hemoglobin: 12.9 g/dL (ref 12.0–15.0)
O2 SAT: 51 %
O2 Saturation: 47 %
O2 Saturation: 48 %
O2 Saturation: 30 %
O2 Saturation: 46 %
PCO2 VEN: 50.8 mmHg (ref 44.0–60.0)
PCO2 VEN: 66.2 mmHg — AB (ref 44.0–60.0)
PH VEN: 7.4 (ref 7.250–7.430)
PO2 VEN: 29 mmHg — AB (ref 32.0–45.0)
PO2 VEN: 22 mmHg — AB (ref 32.0–45.0)
PO2 VEN: 29 mmHg — AB (ref 32.0–45.0)
POTASSIUM: 3.5 mmol/L (ref 3.5–5.1)
Patient temperature: 96.2
Patient temperature: 98
Patient temperature: 98
Patient temperature: 96.7
Potassium: 3.2 mmol/L — ABNORMAL LOW (ref 3.5–5.1)
Potassium: 3.4 mmol/L — ABNORMAL LOW (ref 3.5–5.1)
Potassium: 3.4 mmol/L — ABNORMAL LOW (ref 3.5–5.1)
Potassium: 3.5 mmol/L (ref 3.5–5.1)
SODIUM: 139 mmol/L (ref 135–145)
Sodium: 139 mmol/L (ref 135–145)
Sodium: 137 mmol/L (ref 135–145)
Sodium: 140 mmol/L (ref 135–145)
Sodium: 140 mmol/L (ref 135–145)
TCO2: 33 mmol/L — AB (ref 22–32)
TCO2: 37 mmol/L — ABNORMAL HIGH (ref 22–32)
TCO2: 34 mmol/L — ABNORMAL HIGH (ref 22–32)
TCO2: 35 mmol/L — ABNORMAL HIGH (ref 22–32)
TCO2: 36 mmol/L — ABNORMAL HIGH (ref 22–32)
pCO2, Ven: 72 mmHg (ref 44.0–60.0)
pCO2, Ven: 70.6 mmHg (ref 44.0–60.0)
pCO2, Ven: 74.4 mmHg (ref 44.0–60.0)
pH, Ven: 7.278 (ref 7.250–7.430)
pH, Ven: 7.297 (ref 7.250–7.430)
pH, Ven: 7.28 (ref 7.250–7.430)
pH, Ven: 7.254 (ref 7.250–7.430)
pO2, Ven: 26 mmHg — CL (ref 32.0–45.0)
pO2, Ven: 28 mmHg — CL (ref 32.0–45.0)

## 2017-06-13 LAB — RENAL FUNCTION PANEL
Albumin: 1.2 g/dL — ABNORMAL LOW (ref 3.5–5.0)
Albumin: 1.2 g/dL — ABNORMAL LOW (ref 3.5–5.0)
Anion gap: 7 (ref 5–15)
Anion gap: 8 (ref 5–15)
BUN: 13 mg/dL (ref 6–20)
BUN: 10 mg/dL (ref 6–20)
CALCIUM: 10.2 mg/dL (ref 8.9–10.3)
CO2: 33 mmol/L — AB (ref 22–32)
CO2: 31 mmol/L (ref 22–32)
CREATININE: 0.71 mg/dL (ref 0.44–1.00)
Calcium: 10 mg/dL (ref 8.9–10.3)
Chloride: 96 mmol/L — ABNORMAL LOW (ref 101–111)
Chloride: 97 mmol/L — ABNORMAL LOW (ref 101–111)
Creatinine, Ser: 0.64 mg/dL (ref 0.44–1.00)
GFR calc Af Amer: >60 mL/min (ref >60)
GFR calc Af Amer: >60 mL/min
GFR calc non Af Amer: >60 mL/min (ref >60)
GFR calc non Af Amer: >60 mL/min
GLUCOSE: 127 mg/dL — AB (ref 65–99)
Glucose, Bld: 114 mg/dL — ABNORMAL HIGH (ref 65–99)
Phosphorus: 1.6 mg/dL — ABNORMAL LOW (ref 2.5–4.6)
Phosphorus: 2.1 mg/dL — ABNORMAL LOW (ref 2.5–4.6)
Potassium: 3.4 mmol/L — ABNORMAL LOW (ref 3.5–5.1)
Potassium: 3.3 mmol/L — ABNORMAL LOW (ref 3.5–5.1)
SODIUM: 136 mmol/L (ref 135–145)
Sodium: 136 mmol/L (ref 135–145)

## 2017-06-13 LAB — GLUCOSE, CAPILLARY
GLUCOSE-CAPILLARY: 123 mg/dL — AB (ref 65–99)
GLUCOSE-CAPILLARY: 110 mg/dL — AB (ref 65–99)
GLUCOSE-CAPILLARY: 127 mg/dL — AB (ref 65–99)
Glucose-Capillary: 96 mg/dL (ref 65–99)
Glucose-Capillary: 121 mg/dL — ABNORMAL HIGH (ref 65–99)

## 2017-06-13 LAB — POCT I-STAT, CHEM 8
BUN: 12 mg/dL (ref 6–20)
BUN: 12 mg/dL (ref 6–20)
BUN: 18 mg/dL (ref 6–20)
BUN: 8 mg/dL (ref 6–20)
BUN: 9 mg/dL (ref 6–20)
CHLORIDE: 95 mmol/L — AB (ref 101–111)
CREATININE: 0.5 mg/dL (ref 0.44–1.00)
CREATININE: 0.6 mg/dL (ref 0.44–1.00)
CREATININE: 0.6 mg/dL (ref 0.44–1.00)
Calcium, Ion: 0.61 mmol/L — CL (ref 1.15–1.40)
Calcium, Ion: 0.69 mmol/L — CL (ref 1.15–1.40)
Calcium, Ion: 1.13 mmol/L — ABNORMAL LOW (ref 1.15–1.40)
Calcium, Ion: 1.25 mmol/L (ref 1.15–1.40)
Calcium, Ion: 1.33 mmol/L (ref 1.15–1.40)
Chloride: 91 mmol/L — ABNORMAL LOW (ref 101–111)
Chloride: 92 mmol/L — ABNORMAL LOW (ref 101–111)
Chloride: 93 mmol/L — ABNORMAL LOW (ref 101–111)
Chloride: 94 mmol/L — ABNORMAL LOW (ref 101–111)
Creatinine, Ser: 0.8 mg/dL (ref 0.44–1.00)
Creatinine, Ser: 0.4 mg/dL — ABNORMAL LOW (ref 0.44–1.00)
GLUCOSE: 132 mg/dL — AB (ref 65–99)
GLUCOSE: 141 mg/dL — AB (ref 65–99)
Glucose, Bld: 129 mg/dL — ABNORMAL HIGH (ref 65–99)
Glucose, Bld: 170 mg/dL — ABNORMAL HIGH (ref 65–99)
Glucose, Bld: 174 mg/dL — ABNORMAL HIGH (ref 65–99)
HCT: 41 % (ref 36.0–46.0)
HEMATOCRIT: 33 % — AB (ref 36.0–46.0)
HEMATOCRIT: 35 % — AB (ref 36.0–46.0)
HEMATOCRIT: 35 % — AB (ref 36.0–46.0)
HEMATOCRIT: 42 % (ref 36.0–46.0)
HEMOGLOBIN: 11.2 g/dL — AB (ref 12.0–15.0)
HEMOGLOBIN: 11.9 g/dL — AB (ref 12.0–15.0)
HEMOGLOBIN: 14.3 g/dL (ref 12.0–15.0)
Hemoglobin: 11.9 g/dL — ABNORMAL LOW (ref 12.0–15.0)
Hemoglobin: 13.9 g/dL (ref 12.0–15.0)
POTASSIUM: 3.3 mmol/L — AB (ref 3.5–5.1)
POTASSIUM: 3.5 mmol/L (ref 3.5–5.1)
POTASSIUM: 3.6 mmol/L (ref 3.5–5.1)
Potassium: 6.5 mmol/L (ref 3.5–5.1)
Potassium: 3.6 mmol/L (ref 3.5–5.1)
SODIUM: 134 mmol/L — AB (ref 135–145)
SODIUM: 137 mmol/L (ref 135–145)
Sodium: 138 mmol/L (ref 135–145)
Sodium: 139 mmol/L (ref 135–145)
Sodium: 140 mmol/L (ref 135–145)
TCO2: 30 mmol/L (ref 22–32)
TCO2: 31 mmol/L (ref 22–32)
TCO2: 31 mmol/L (ref 22–32)
TCO2: 31 mmol/L (ref 22–32)
TCO2: 34 mmol/L — ABNORMAL HIGH (ref 22–32)

## 2017-06-13 LAB — CBC
HCT: 36.7 % (ref 36.0–46.0)
Hemoglobin: 11.8 g/dL — ABNORMAL LOW (ref 12.0–15.0)
MCH: 29.3 pg (ref 26.0–34.0)
MCHC: 32.2 g/dL (ref 30.0–36.0)
MCV: 91.1 fL (ref 78.0–100.0)
PLATELETS: 23 10*3/uL — AB (ref 150–400)
RBC: 4.03 MIL/uL (ref 3.87–5.11)
RDW: 16.2 % — ABNORMAL HIGH (ref 11.5–15.5)
WBC: 9.1 10*3/uL (ref 4.0–10.5)

## 2017-06-13 LAB — MAGNESIUM: Magnesium: 1.8 mg/dL (ref 1.7–2.4)

## 2017-06-13 LAB — CK: CK TOTAL: 15 U/L — AB (ref 38–234)

## 2017-06-13 MED ORDER — STERILE WATER FOR INJECTION IV SOLN
INTRAVENOUS | Status: AC
  Administered 2017-06-13: 18:00:00 via INTRAVENOUS
  Filled 2017-06-13: qty 548.8

## 2017-06-13 MED ORDER — DEXTROSE 5 % IV SOLN
30.0000 mmol | Freq: Once | INTRAVENOUS | Status: AC
  Administered 2017-06-13: 30 mmol via INTRAVENOUS
  Filled 2017-06-13: qty 10

## 2017-06-13 MED ORDER — FENTANYL CITRATE (PF) 100 MCG/2ML IJ SOLN
25.0000 ug | INTRAMUSCULAR | Status: DC | PRN
  Administered 2017-06-13: 75 ug via INTRAVENOUS
  Administered 2017-06-14 – 2017-06-16 (×3): 100 ug via INTRAVENOUS
  Filled 2017-06-13 (×5): qty 2

## 2017-06-13 MED ORDER — POTASSIUM PHOSPHATES 15 MMOLE/5ML IV SOLN
10.0000 mmol | Freq: Once | INTRAVENOUS | Status: AC
  Administered 2017-06-13: 10 mmol via INTRAVENOUS
  Filled 2017-06-13: qty 3.33

## 2017-06-13 NOTE — Progress Notes (Signed)
PULMONARY / CRITICAL CARE MEDICINE   Name: [name] MRN: [id number] DOB: November 01, 1934    ADMISSION DATE:  06/10/2017 CONSULTATION DATE:  05/24/2017  REFERRING MD:  Dr. Sherral Hammers  CHIEF COMPLAINT:  Acute hypotension  HISTORY OF PRESENT ILLNESS:   82 year old female with history of HFpEF, necrotizing fasciitis of left lower leg, HLD, tricuspid regurg, pulmonary hypertension, CKD III, COPD on 2.5L at night presents with hypotension requiring pressor support after repeat left leg wound debridement earlier today (10/17). Patient was in her usual state of health until 9/22 when she presented for traumatic left leg hematoma and hypovolemic shock. She was admitted to the hospital 05/10/17-05/16/2017 and subsequently discharged to SNF where she experienced worsening weakness. She was then readmitted with acute on chronic HFpEF exacerbation on 10/1.  During this hospital stay patient continued to have hemodynamic instability requiring 8 units PRBC over her hospital course. She has been taken back to the OR  On 10/5, 10/10, 10/12, and 10/17 for further wound debridement. Today, after her procedure was completed and wound vac in place, she was in PACU where her blood pressure was noted to drop to 56O systolic. She was given albumin. Neo was initiated. Patient indicated she was dizzy and lightheaded. 3u PRBC were administered. PCCM was called for consult.  Of note patient had grossly bloody stool in the PACU with her hypotensive episode. Bowel movement was described as formed but grossly bloody/in a pool of blood. She denies prior episodes of melena or hematochezia. She endorses being constipated over past few days.   SUBJECTIVE: A line placed yesterday. Renal recommending fluid off. Ortho planning for AKA. No acute events overnight.  VITAL SIGNS: BP (!) 100/45   Pulse 67   Temp (!) 97.5 F (36.4 C) (Axillary)   Resp (!) 22   Ht 5' 2.99" (1.6 m)   Wt 246 lb 0.5 oz (111.6 kg)   SpO2 100%   BMI 43.59  kg/m   HEMODYNAMICS: CVP:  [6 mmHg-7 mmHg] 6 mmHg  VENTILATOR SETTINGS: Vent Mode: PRVC FiO2 (%):  [40 %] 40 % Set Rate:  [16 bmp] 16 bmp Vt Set:  [450 mL] 450 mL PEEP:  [5 cmH20] 5 cmH20 Pressure Support:  [10 cmH20] 10 cmH20 Plateau Pressure:  [15 cmH20-20 cmH20] 18 cmH20  INTAKE / OUTPUT: I/O last 3 completed shifts: In: 5525.6 [I.V.:4649.6; IV Piggyback:876] Out: 1308 [Emesis/NG output:100; Drains:365; Other:6569]  PHYSICAL EXAMINATION: General:  NAD, resting in bed Neuro:  Wakes to name, follows commands, alert Cardiovascular:  RRR, no m/r/g Lungs:  MV. +coarse breath sound anteriorly, no W/R/R Abdomen:  +surgical wound vac on abdomen,  Abdomen +mildly distended, no rebound or guarding. JP drain with dark fluid. Musculoskeletal: left wound vac, leg wrapped, bandage clean and dry Skin:  No rashes or lesions  LABS:  BMET  Recent Labs Lab 06/12/17 0423  06/12/17 1529  06/13/17 0433 06/13/17 0438 06/13/17 0440  NA 138  < > 134*  < > 140 136 137  K 3.6  < > 3.5  < > 3.6 3.4* 3.5  CL 96*  < > 96*  < > 91* 96* 93*  CO2 32  --  31  --   --  33*  --   BUN 15  < > 13  < > 8 13 12   CREATININE 0.83  < > 0.82  < > 0.40* 0.71 0.60  GLUCOSE 131*  < > 141*  < > 170* 127* 129*  < > =  values in this interval not displayed.  Electrolytes  Recent Labs Lab 06/11/17 0431  06/12/17 0423 06/12/17 1529 06/13/17 0438  CALCIUM 8.8*  < > 9.2 9.8 10.2  MG 1.8  --  2.0  --  1.8  PHOS 2.3*  < > 1.2* 2.1* 1.6*  < > = values in this interval not displayed.  CBC  Recent Labs Lab 06/11/17 0830  06/12/17 0823  06/13/17 0433 06/13/17 0438 06/13/17 0440  WBC 7.2  --  8.6  --   --  9.1  --   HGB 11.5*  < > 12.6  < > 13.9 11.8* 11.9*  HCT 35.6*  < > 38.3  < > 41.0 36.7 35.0*  PLT 24*  --  18*  --   --  23*  --   < > = values in this interval not displayed.  Coag's  Recent Labs Lab 06/09/2017 0719 06/07/17 0500 06/08/17 0830 06/09/17 0500  APTT 33  --   --   --   INR  1.51 1.59 1.45 1.47    Sepsis Markers No results for input(s): LATICACIDVEN, PROCALCITON, O2SATVEN in the last 168 hours.  ABG  Recent Labs Lab 06/07/17 0450 06/07/17 0845 06/08/17 0450  PHART 7.493* 7.405 7.349*  PCO2ART 33.4 42.2 47.7  PO2ART 81.5* 75.0* 92.1    Liver Enzymes  Recent Labs Lab 06/09/17 0500 06/10/17 0555  06/12/17 0423 06/12/17 1529 06/13/17 0438  AST 121* 49*  --  27  --   --   ALT 19 11*  --  9*  --   --   ALKPHOS 130* 139*  --  138*  --   --   BILITOT 1.6* 1.6*  --  1.4*  --   --   ALBUMIN 1.4* 1.4*  < > 1.3* 1.2* 1.2*  < > = values in this interval not displayed.  Cardiac Enzymes No results for input(s): TROPONINI, PROBNP in the last 168 hours.  Glucose  Recent Labs Lab 06/12/17 0753 06/12/17 1234 06/12/17 1531 06/12/17 1956 06/12/17 2358 06/13/17 0344  GLUCAP 117* 117* 147* 145* 123* 123*    Imaging No results found.   STUDIES:  Renal US 10/22 > no lesions or obstructing stone.  CULTURES: Surgical wound culture 10/10 >> E coli, Pseudomonas, Morganella morganii, proteus, VRE  ANTIBIOTICS: 10/10 Ancef >> 10/12 10/15 Linezolid >> 10/21 10/12 Zosyn >> Daptomycin 10/22 >>   SIGNIFICANT EVENTS: 10/5 Left thigh and left leg debridement 10/10 Repeat irrigation and debridement left thigh and left leg 10/12 Debridement left leg and thigh 10/17 Debridement left thigh wound 10/17 EGD, exploratory laparotomy with bleeding duodenal ulcer/perforated duodenum. Intubated, maxed on 4 pressors in ICU. 10/23- CVVHD  LINES/TUBES: PIV x3 Foley catheter Negative pressure wound L leg Closed system drain RLQ bulb CVC triple lumen NG/OG  DISCUSSION: 82 year old female with history of HFpEF, necrotizing fasciitis of left lower leg, HLD, tricuspid regurg, pulmonary hypertension, CKD III, COPD on 2L at night, presents after repeat left lower extremity debridement with acute hypotension. Subsequently found to have duodenal ulcer with  perforation, in hemorrhagic shock. Maxed on 4 pressors in ICU, intubated.   ASSESSMENT / PLAN:  GASTROINTESTINAL A:   Acute GI bleed 2/2 duodenal ulcer with hemorrhagic shock - s/p ex lap with repair 10/17 Shock liver; ALT 389, AST 2448 Nutrition P:   UGI series once able per surgery PPI gtt Follow coags, CBC Continue TNA per pharm  PULMONARY A: Pulmonary Hypertension - RHC 11/2015 with [place] 59/17  mmHg Mechanically ventilated P:   Holding home lasix, letaris Continue vent support SBT daily, goal 5/5. Tolerating SBT well Hopeful extubation soon, once more fluid off via CVVHD Bronchial hygiene CXR intermittently  CARDIOVASCULAR A:  HFpEF (Last echo 05/21/2017 indicates EF 60-65%, moderate MR, mod pulm HTN) with intermittent episodes of hypotension. Hypovolemic shock requiring massive blood transfusion Paroxysmal Atrial fibrillation CAD Elevated troponin P:  Levophed at 12 - BP reads may be innacurate No anticoagulation due to GIB Follow CVP Monitor intake/output  RENAL A:   AKI on CKD stage III (baseline Cr 0.8-1.1), worsening.  Continues to have anuria Hypokalemia Hypocalcemia P:   Continue CRRT per nephrology Monitor electrolytes Follow BMP q12hrs given anuria  HEMATOLOGIC A:   Hemorrhagic shock requiring massive blood transfusions  Acute blood loss anemia - due to above Thrombocytopenia P:  Transfuse for Hgb < 8 Monitor CBC  INFECTIOUS A:   Surgical wound culture 10/10 >> E coli, Pseudomonas, Morganella morganii, proteus, VRE Massive wound necrosis of LLE, will require AKA P:   S/p linezolid >> now on zosyn and daptomycin Follow wound cultures Ortho planning for left AKA once stabilized   ENDOCRINE A:   Hypoglycemia - resolved P:  Monitor glucose on BMP, CBG  NEUROLOGIC A:   Remains neurologically intact, no acute process  P:   Monitor mental status  FAMILY  - Inter-disciplinary family meet or Palliative Care meeting due by:   06/11/2017.  Everrett Coombe, MD PGY-2 [facility]

## 2017-06-13 NOTE — Progress Notes (Signed)
PHARMACY - ADULT TOTAL PARENTERAL NUTRITION CONSULT NOTE   Pharmacy Consult:  TPN Indication:  Prolonged NPO status  Patient Measurements: Height: 5' 2.99" (160 cm) Weight: 246 lb 0.5 oz (111.6 kg) IBW/kg (Calculated) : 52.38 TPN AdjBW (KG): 64 Body mass index is 43.59 kg/m.  Assessment:  82 year old female presented on 06/15/2017 with CHF exacerbation, hypotension and elevated troponins.  PMH significant for AFib, dHF, CKD3, GERD, HLD, CAD, and pulmonary HTN.  Patient has gangrenous left thigh/leg and underwent multiple I&Ds.  She required transfer to the ICU after the I&D on 06/14/2017 d/t hypotension from duodenal ulcer bleeding.  Patient underwent ex-lap with pyloroplasty and oversewing of the bleeding ulcer on the same day.  Per charting, patient was on a carb modified diet through 06/03/17.  She has been NPO x6 days with anticipated prolonged NPO status; therefore, pharmacy was consulted to manage TPN.  GI: GIB on IV PPI BID, drains O/P 140 mL (decreased). NGT output 290 mL. ?BL prealbumin >60.  UGI when stable. Alb 1.2.  Endo: no hx DM - CBGs low-low normal prior TPN, then elevated, now controlled with less CHO in customized TPN Insulin requirements in the past 24 hours: 8 units SSI (decreased) Lytes: ?refeeding.  K+ 3.5, CL low at 93 (max CL in TPN), Phos low but increasing at 1.6, up from 1.2 yesterday with addition of Phos to TPN (NaPhos 30 mmol ordered x1; has received daily x3 days), Mg 1.8, iCa 1.33 (Ca gluconate through CRRT).   Renal: CKD3 - CRRT (4/2.5 bags) started 10/23 >> present - SCr 0.6, BUN normalized - no UOP, 4.2L removed/24hrs, net +16.7L since admit, D5NS at 20 ml/hr.  Pulm: intubated 10/17, FiO2 40% (attempting wean) Cards: s/p code blue, MAP 60s, brady, CVP 6, in Afib - Levophed gtt @ 12 AC: Eliquis for Afib held since 9/23 d/t LLE hematoma and massive duodenal bleed - H/H 11.9/35, plts 258>23K Ortho: gangrenous left thigh/leg s/p I&D 10/5, 10/10, 10/12, 10/17.   Needs AKA. Hepatobil: LFTs improving, tbili down to 1.4 (no jaundice) Neuro: PRN Fentanyl - GCS 13, RASS at goal 0. Responds to questions. ID: Zosyn/Cubicin for infected leg wound.  Possible PNA on CXR - low temperatures on CRRT, WBC down to within normal limits. Best Practices: CHG TPN Access: CVC placed 06/05/17 TPN start date: 06/09/17  Nutritional Goals (per RD rec on 10/19): 7253-6644 kCal and 130gm protein per day  Current Nutrition:  TPN  Plan:  - Customized TPN (AA/CHO: 98/16) at 35 ml/hr (goal rate ~55 ml/hr), no electrolytes except NaPhos per Renal's request.  Today's TPN will provide an additional 7mmol NaPhos starting at 1800. Maximizing Chloride in TPN.  Will advance to goal when lytes improve. - Hold 20% ILE for the first 7 days of TPN in ICU patients per ASPEN/SCCM guidelines (start date 06/16/17) - TPN providing 82gm of protein and 134g of CHO = 786 kCal, meeting ~60% of total needs. - Daily multivitamin and trace elements in TPN  - Continue moderate SSI Q4H (anticipate increasing TPN rate in AM, pending lytes) - F/U daily, abx adjustment if +PNA    Sloan Leiter, PharmD, BCPS, BCCCP Clinical Pharmacist Clinical phone 06/13/2017 until 3:30PM - #[phone] After hours, please call [phone] 06/13/2017, 7:37 AM

## 2017-06-13 NOTE — Progress Notes (Signed)
S: Nodding to communicate today, agrees that she is comfortable at this time. Denies chest pain   O:BP 107/63   Pulse 63   Temp (!) 96.2 F (35.7 C) (Axillary)   Resp (!) 24   Ht 5' 2.99" (1.6 m)   Wt 246 lb 0.5 oz (111.6 kg)   SpO2 99%   BMI 43.59 kg/m   Intake/Output Summary (Last 24 hours) at 06/13/17 1113 Last data filed at 06/13/17 1100  Gross per 24 hour  Intake          2781.96 ml  Output             4687 ml  Net         -1905.04 ml   Intake/Output: I/O last 3 completed shifts: In: 4748.7 [I.V.:4440.7; IV NIOEVOJJK:093] Out: 5928 [Emesis/NG output:290; Drains:240; GHWEX:9371]  Intake/Output this shift:  Total I/O In: 280 [I.V.:280] Out: 896 [Other:896] Weight change: -5 lb 4.7 oz (-2.4 kg) Gen: no acute distress entub, NG, pale, obese CVS: RRR Resp: clear lungs over anterior fields  Rales in bases post Abd: BS-, distended, soft, non tender, drain and wound vac in place with dressings CDI   Ext:2+ lower extremity pitting edema    Recent Labs Lab 06/09/17 0500  06/10/17 0555  06/10/17 1700  06/11/17 0431  06/11/17 1600  06/12/17 0423  06/12/17 1529  06/12/17 2047 06/12/17 2051 06/13/17 0012 06/13/17 0019 06/13/17 0433 06/13/17 0438 06/13/17 0440  NA 141  < > 142  < > 139  < > 139  < > 139  < > 138  < > 134*  < > 139 138 139 138 140 136 137  K 3.9  < > 3.8  < > 3.7  < > 3.1*  < > 3.4*  < > 3.6  < > 3.5  < > 3.6 3.4* 3.6 3.3* 3.6 3.4* 3.5  CL 109  < > 107  --  104  --  99*  --  97*  < > 96*  < > 96*  < > 90* 90* 92* 95* 91* 96* 93*  CO2 25  < > 22  --  23  --  28  --  29  --  32  --  31  --   --   --   --   --   --  33*  --   GLUCOSE 77  < > 109*  --  188*  --  231*  --  217*  < > 131*  < > 141*  < > 186* 145* 174* 132* 170* 127* 129*  BUN 56*  < > 63*  --  54*  --  36*  --  17  < > 15  < > 13  < > _0 CREATININE 2.94*  < > 3.22*  --  2.73*  --  1.79*  --  0.90  < > 0.83  < > 0.82  < > 0.50 0.60 0.50 0.60 0.40* 0.71 0.60  ALBUMIN 1.4*  --   1.4*  --  1.4*  --  1.2*  --  1.5*  --  1.3*  --  1.2*  --   --   --   --   --   --  1.2*  --   CALCIUM 7.7*  < > 8.0*  --  8.4*  --  8.8*  --  8.3*  --  9.2  --  9.8  --   --   --   --   --   --  10.2  --   PHOS 4.8*  --  5.1*  --  4.2  --  2.3*  --  1.8*  --  1.2*  --  2.1*  --   --   --   --   --   --  1.6*  --   AST 121*  --  49*  --   --   --   --   --   --   --  27  --   --   --   --   --   --   --   --   --   --   ALT 19  --  11*  --   --   --   --   --   --   --  9*  --   --   --   --   --   --   --   --   --   --   < > = values in this interval not displayed. Liver Function Tests:  Recent Labs Lab 06/09/17 0500 06/10/17 0555  06/12/17 0423 06/12/17 1529 06/13/17 0438  AST 121* 49*  --  27  --   --   ALT 19 11*  --  9*  --   --   ALKPHOS 130* 139*  --  138*  --   --   BILITOT 1.6* 1.6*  --  1.4*  --   --   PROT 4.2* 4.2*  --  4.7*  --   --   ALBUMIN 1.4* 1.4*  < > 1.3* 1.2* 1.2*  < > = values in this interval not displayed. No results for input(s): LIPASE, AMYLASE in the last 168 hours. No results for input(s): AMMONIA in the last 168 hours. CBC:  Recent Labs Lab 06/10/17 0555  06/10/17 1742  06/11/17 0830  06/12/17 0823  06/13/17 0433 06/13/17 0438 06/13/17 0440  WBC 14.8*  --  11.6*  --  7.2  --  8.6  --   --  9.1  --   NEUTROABS 13.4*  --   --   --   --   --   --   --   --   --   --   HGB 11.7*  < > 12.4  < > 11.5*  < > 12.6  < > 13.9 11.8* 11.9*  HCT 37.6  < > 38.4  < > 35.6*  < > 38.3  < > 41.0 36.7 35.0*  MCV 94.0  --  94.1  --  92.7  --  91.4  --   --  91.1  --   PLT 46*  --  34*  --  24*  --  18*  --   --  23*  --   < > = values in this interval not displayed. Cardiac Enzymes:  Recent Labs Lab 06/10/17 0555 06/13/17 0438  CKTOTAL <5* 15*   CBG:  Recent Labs Lab 06/12/17 1531 06/12/17 1956 06/12/17 2358 06/13/17 0344 06/13/17 0748  GLUCAP 147* 145* 123* 123* 110*    Iron Studies: No results for input(s): IRON, TIBC, TRANSFERRIN, FERRITIN in  the last 72 hours. Studies/Results: Dg Chest Port 1 View  Result Date: 06/13/2017 CLINICAL DATA:  Shortness of breath. EXAM: PORTABLE CHEST 1 VIEW COMPARISON:  06/10/2017 . FINDINGS: Endotracheal tube, bilateral IJ lines, NG tube in stable position. Cardiomegaly with diffuse bilateral pulmonary infiltrates and bilateral pleural effusions again noted. Interim progression  from prior exam. No pneumothorax . IMPRESSION: 1. Lines and tubes in stable position. 2. Cardiomegaly with diffuse bilateral pulmonary infiltrates/ edema and bilateral pleural effusions again noted. Interim progression from prior exam. Electronically Signed   By: [place]   On: 06/13/2017 06:39   . brimonidine  1 drop Right Eye BID  . brinzolamide  1 drop Both Eyes BID  . chlorhexidine gluconate (MEDLINE KIT)  15 mL Mouth Rinse BID  . Chlorhexidine Gluconate Cloth  6 each Topical Daily  . docusate  100 mg Oral BID  . Gerhardt's butt cream   Topical BID  . insulin aspart  0-15 Units Subcutaneous Q4H  . latanoprost  1 drop Right Eye QHS  . mouth rinse  15 mL Mouth Rinse QID  . pantoprazole (PROTONIX) IV  40 mg Intravenous Q12H  . sodium chloride flush  3 mL Intravenous Q12H  . timolol  1 drop Both Eyes BID    BMET    Component Value Date/Time   NA 137 06/13/2017 0440   K 3.5 06/13/2017 0440   CL 93 (L) 06/13/2017 0440   CO2 33 (H) 06/13/2017 0438   GLUCOSE 129 (H) 06/13/2017 0440   BUN 12 06/13/2017 0440   CREATININE 0.60 06/13/2017 0440   CREATININE 1.10 (H) 01/31/2016 1118   CALCIUM 10.2 06/13/2017 0438   GFRNONAA >60 06/13/2017 0438   GFRAA >60 06/13/2017 0438   CBC    Component Value Date/Time   WBC 9.1 06/13/2017 0438   RBC 4.03 06/13/2017 0438   HGB 11.9 (L) 06/13/2017 0440   HCT 35.0 (L) 06/13/2017 0440   PLT 23 (LL) 06/13/2017 0438   MCV 91.1 06/13/2017 0438   MCH 29.3 06/13/2017 0438   MCHC 32.2 06/13/2017 0438   RDW 16.2 (H) 06/13/2017 0438   LYMPHSABS 0.8 06/10/2017 0555   MONOABS 0.5  06/10/2017 0555   EOSABS 0.1 06/10/2017 0555   BASOSABS 0.0 06/10/2017 0555     Assessment/Plan:  1. AKI on CKD III ( baseline crt 0.8) secondary to acute hemorrhagic shock - CRRT working to improve volume overload, she still appears overloaded on exam but has good amount of fluid taken off. Potassium, anion gap, BUN and creatinine are all being controlled at this time.  1. Follow up results of urinalysis when we are able to collect urine  Still anuric ATN 2. Refeeding syndrome - Phos is still low today, Na phos has been added to TNA. K improved with runs and adjustment of dialysate.  3. Ventilator dependent respiratory failure, pulmonary htn  - hopeful she will continue to wean  4. Acute GI bleed - hemoglobin continue to improve  5. Hypotension - on levophed with A line   6. Thrombocytopenia - monitoring  7. Necrotizing fascitis - orthopedics recommending AKA 8. Paroxysmal atrial fibrillation - NSR on exam today, holding AC for GIB  9. Shock liver, resolved  10. CAD  62. Morbid Obesity   Ledell Noss  I have seen and examined this patient and agree with the plan of care seen, eval, discussed with daughter, resident, staff in ICU .  DETERDING,JAMES L 06/13/2017, 1:38 PM

## 2017-06-13 NOTE — Progress Notes (Signed)
9 Days Post-Op   Subjective/Chief Complaint: On vent   Objective: Vital signs in last 24 hours: Temp:  [93.6 F (34.2 C)-97.5 F (36.4 C)] 96.2 F (35.7 C) (10/26 0749) Pulse Rate:  [52-96] 59 (10/26 0900) Resp:  [10-24] 22 (10/26 0900) BP: (59-131)/(45-117) 88/76 (10/26 0900) SpO2:  [68 %-100 %] 90 % (10/26 0900) Arterial Line BP: (83-137)/(39-64) 98/48 (10/26 0900) FiO2 (%):  [40 %] 40 % (10/26 0737) Weight:  [111.6 kg (246 lb 0.5 oz)] 111.6 kg (246 lb 0.5 oz) (10/26 0500) Last BM Date: 06/11/17  Intake/Output from previous day: 10/25 0701 - 10/26 0700 In: 3206.3 [I.V.:2898.3; IV Piggyback:308] Out: 4612 [Emesis/NG output:290; Drains:140] Intake/Output this shift: Total I/O In: 140 [I.V.:140] Out: 398 [Other:398]  General appearance: awake on vent Resp: clear to auscultation bilaterally Cardio: S1, S2 normal GI: soft, VAC on wound, JP green scant output  Lab Results:   Recent Labs  06/12/17 0823  06/13/17 0438 06/13/17 0440  WBC 8.6  --  9.1  --   HGB 12.6  < > 11.8* 11.9*  HCT 38.3  < > 36.7 35.0*  PLT 18*  --  23*  --   < > = values in this interval not displayed. BMET  Recent Labs  06/12/17 1529  06/13/17 0438 06/13/17 0440  NA 134*  < > 136 137  K 3.5  < > 3.4* 3.5  CL 96*  < > 96* 93*  CO2 31  --  33*  --   GLUCOSE 141*  < > 127* 129*  BUN 13  < > 13 12  CREATININE 0.82  < > 0.71 0.60  CALCIUM 9.8  --  10.2  --   < > = values in this interval not displayed. PT/INR No results for input(s): LABPROT, INR in the last 72 hours. ABG  Recent Labs  06/11/17 1244 06/11/17 1638  HCO3 28.2* 29.3*    Studies/Results: Dg Chest Port 1 View  Result Date: 06/13/2017 CLINICAL DATA:  Shortness of breath. EXAM: PORTABLE CHEST 1 VIEW COMPARISON:  06/10/2017 . FINDINGS: Endotracheal tube, bilateral IJ lines, NG tube in stable position. Cardiomegaly with diffuse bilateral pulmonary infiltrates and bilateral pleural effusions again noted. Interim  progression from prior exam. No pneumothorax . IMPRESSION: 1. Lines and tubes in stable position. 2. Cardiomegaly with diffuse bilateral pulmonary infiltrates/ edema and bilateral pleural effusions again noted. Interim progression from prior exam. Electronically Signed   By: [place]   On: 06/13/2017 06:39    Anti-infectives: Anti-infectives    Start     Dose/Rate Route Frequency Ordered Stop   06/11/17 1515  DAPTOmycin (CUBICIN) 1,000 mg in sodium chloride 0.9 % IVPB     1,000 mg 240 mL/hr over 30 Minutes Intravenous Every 48 hours 06/10/17 0734     06/10/17 1500  piperacillin-tazobactam (ZOSYN) IVPB 3.375 g     3.375 g 100 mL/hr over 30 Minutes Intravenous Every 6 hours 06/10/17 1017     06/10/17 1200  piperacillin-tazobactam (ZOSYN) IVPB 3.375 g  Status:  Discontinued     3.375 g 12.5 mL/hr over 240 Minutes Intravenous Every 6 hours 06/10/17 0718 06/10/17 0719   06/10/17 1200  piperacillin-tazobactam (ZOSYN) IVPB 3.375 g  Status:  Discontinued     3.375 g 100 mL/hr over 30 Minutes Intravenous Every 6 hours 06/10/17 0720 06/10/17 1017   06/09/17 2300  piperacillin-tazobactam (ZOSYN) IVPB 2.25 g  Status:  Discontinued     2.25 g 100 mL/hr over 30 Minutes Intravenous  Every 8 hours 06/09/17 1729 06/10/17 0714   06/09/17 2200  piperacillin-tazobactam (ZOSYN) IVPB 3.375 g  Status:  Discontinued     3.375 g 12.5 mL/hr over 240 Minutes Intravenous Every 8 hours 06/09/17 1515 06/09/17 1634   06/09/17 1645  piperacillin-tazobactam (ZOSYN) IVPB 2.25 g  Status:  Discontinued     2.25 g 100 mL/hr over 30 Minutes Intravenous Every 8 hours 06/09/17 1634 06/09/17 1729   06/09/17 1515  DAPTOmycin (CUBICIN) 750 mg in sodium chloride 0.9 % IVPB  Status:  Discontinued     750 mg 230 mL/hr over 30 Minutes Intravenous Every 48 hours 06/09/17 1500 06/10/17 0734   06/01/2017 0930  ceFAZolin (ANCEF) IVPB 2g/100 mL premix     2 g 200 mL/hr over 30 Minutes Intravenous On call to O.R. 06/03/17 1214  05/21/2017 1006   06/02/17 2200  linezolid (ZYVOX) IVPB 600 mg  Status:  Discontinued    Comments:  Pharmacy may adjust dose prn   600 mg 300 mL/hr over 60 Minutes Intravenous Every 12 hours 06/02/17 1546 06/08/17 0915   06/02/17 1545  linezolid (ZYVOX) IVPB 600 mg  Status:  Discontinued     600 mg 300 mL/hr over 60 Minutes Intravenous Every 12 hours 06/02/17 1543 06/02/17 1546   05/31/17 1500  vancomycin (VANCOCIN) 1,250 mg in sodium chloride 0.9 % 250 mL IVPB  Status:  Discontinued     1,250 mg 166.7 mL/hr over 90 Minutes Intravenous Every 24 hours 05/23/2017 1409 06/03/17 0845   05/29/2017 2000  piperacillin-tazobactam (ZOSYN) IVPB 3.375 g  Status:  Discontinued     3.375 g 12.5 mL/hr over 240 Minutes Intravenous Every 8 hours 05/22/2017 1409 06/09/17 1515   06/06/2017 1300  vancomycin (VANCOCIN) 2,000 mg in sodium chloride 0.9 % 500 mL IVPB     2,000 mg 250 mL/hr over 120 Minutes Intravenous  Once 06/16/2017 1208 05/21/2017 1657   05/22/2017 1300  piperacillin-tazobactam (ZOSYN) IVPB 3.375 g     3.375 g 100 mL/hr over 30 Minutes Intravenous  Once 05/27/2017 1208 06/10/2017 1334   06/09/2017 0745  ceFAZolin (ANCEF) IVPB 2g/100 mL premix  Status:  Discontinued     2 g 200 mL/hr over 30 Minutes Intravenous On call to O.R. 06/16/2017 0736 05/19/2017 1143   06/01/2017 1800  ceFAZolin (ANCEF) IVPB 2g/100 mL premix  Status:  Discontinued     2 g 200 mL/hr over 30 Minutes Intravenous Every 8 hours 06/07/2017 1220 05/30/17 1210   06/07/2017 0945  ceFAZolin (ANCEF) IVPB 2g/100 mL premix     2 g 200 mL/hr over 30 Minutes Intravenous To Surgery 05/27/17 2001 05/21/2017 1015   05/27/2017 2000  ceFAZolin (ANCEF) IVPB 2g/100 mL premix     2 g 200 mL/hr over 30 Minutes Intravenous Every 6 hours 06/07/2017 1643 05/24/17 1009   06/10/2017 1015  ceFAZolin (ANCEF) IVPB 2g/100 mL premix     2 g 200 mL/hr over 30 Minutes Intravenous On call to O.R. 06/12/2017 1014 05/20/2017 1400      Assessment/Plan: Active Problems:   Acute on chronic  diastolic CHF (congestive heart failure) (HCC)   Hypotension   Elevated troponin   Atherosclerosis of native arteries of extremities with gangrene, left leg (HCC)   Traumatic hematoma of left lower leg   Necrotizing fasciitis of lower leg (HCC)   Pressure injury of skin   Hemorrhagic shock (HCC)   Acute hypoxemic respiratory failure (HCC)   AKI (acute kidney injury) ([place])   UGIB (upper gastrointestinal bleed)  13F hx of HFpEF, nec fasciitis of LLE, HLD, pHTN, CKD III, COPD on home O2 (2.5L) - was being managed by ortho for LLE wound and postop developed massive GIB - found to have DU that likely had eroded through GDA -   Bleeding duodenal ulcer and hemorrhagic shock - S/P Exploratory laparotomy with pyloroplasty and oversewing bleeding duodenal ulcer, Dr. Brantley Stage, 10/17 - JP drain output down but green tinge - still requiring levo - weaning off vent per CCM  FEN: NPO, NGT/ TNA VTE: SCD's, no chemical prophylaxis in setting of GIB ID: Zosyn 10/12>>   Zyvox 10/15>> - Surgical wound culture 10/10 >> E coli, Pseudomonas, Morganella morganii, proteus, VRE Foley: yes, oliguric, AKI on CRRT Follow up: TBD  DISPO: -Will place wound vac to control serous drainage from midline wound Will need upper GI when stable - before able to start enteric feeds I spoke with her daughter.  LOS: 25 days    Josephanthony Tindel E 06/13/2017

## 2017-06-13 NOTE — Progress Notes (Signed)
Pharmacy Antibiotic Note  [name] is a 82 y.o. female with a PMH of CKD III and a recent hospitalization for a traumatic left leg hematoma was readmitted on 06/06/2017 with a necrotizing LLE wound infection.  Had several I&Ds and then became hemodynamically unstable on 10/17 due to a large duodenal ulcer which required a massive blood transfusion after bursting during an endoscopy. Is s/p exploratory laperotomy. Wound cx abundantly grew several species, most notably pseudomonas and VRE. Was being maintained on zyvox and zosyn, but due to worsening thrombocytopenia, zyvox was discontinued on 10/21.  She was initiated on CRRT on 10/23. Pharmacy has been consulted for daptomycin and zosyn dosing. Daptomycin approved by ID (Dr. Baxter Flattery) to cover VRE. Currently using TBW to dose, but with extreme of obesity, may consider using ABW going forward.  Today, WBC are WNL and she remains afebrile. She remains intubated and on CRRT. Platelets remain low. She has not made any urine since 10/18. CK level increased to 15. No current complaints of muscle pain, but is on PRN fentanyl. She remains intubated, on levophed, and on TNA. Per RN, wound appears very necrosed and current plan is to amputate limb once she is more stable.   Plan: Zosyn 3.375 g IV q6h Daptomycin 1000 mg IV q48h (8 mg/kg) CPK q72h Follow renal function, clinical course and CRRT plans  ------------------------------------------------- Height: 5' 2.99" (160 cm) Weight: 246 lb 0.5 oz (111.6 kg) IBW/kg (Calculated) : 52.38  Temp (24hrs), Avg:95.2 F (35.1 C), Min:93.6 F (34.2 C), Max:97.5 F (36.4 C)   Recent Labs Lab 06/10/17 0555  06/10/17 1742  06/11/17 0830  06/12/17 0823  06/13/17 0012 06/13/17 0019 06/13/17 0433 06/13/17 0438 06/13/17 0440  WBC 14.8*  --  11.6*  --  7.2  --  8.6  --   --   --   --  9.1  --   CREATININE 3.22*  < >  --   < >  --   < >  --   < > 0.50 0.60 0.40* 0.71 0.60  < > = values in this interval  not displayed.  Estimated Creatinine Clearance: 67.4 mL/min (by C-G formula based on SCr of 0.6 mg/dL).    No Known Allergies  Antimicrobials this admission: Vanc 10/12 >> 10/15 Linezolid 10/15 >> 10/21 Zosyn 10/12 >> Daptomycin 10/22 >>  Dose adjustments this admission: CRRT: Increased zosyn to q6h; Increased daptomycin to 1000 mg.   Microbiology results: Wound Cx: E coli, Pseudomonas, Morganella morganii, Proteus mirabilis, VRE (sensitive to linezolid)  10/2 MRSA PCR: neg  Thank you for allowing pharmacy to be a part of this patient's care.  Sallyanne Havers, PharmD Candidate 06/13/2017 11:04 AM

## 2017-06-13 NOTE — Progress Notes (Signed)
  Interdisciplinary Goals of Care Family Meeting   Date carried out:: 06/13/2017  Location of the meeting: Bedside  Member's involved: Physician and Other: resident and husband  Durable Power of Attorney or Loss adjuster, chartered: husband    Discussion: We discussed goals of care for [name] .  About likely need for tamputaiton, trach, ltac. He listened attentively and said he hopes patient can voice opinion. REcommended palliattive consult for goals of care. He agreed  Code status: Limited Code or DNR with short term  Disposition: Continue current acute care  Time spent for the meeting: 10 min  [name] 06/13/2017, 3:04 PM

## 2017-06-13 NOTE — Care Management Note (Signed)
Case Management Note Previous Note Created by Michail Jewels  Patient Details  Name: [name] MRN: [id number] Date of Birth: 14-Dec-1934  Subjective/Objective:  Pt presented for Fall and suffered from a left leg traumatic hematoma complicated by hypovolemic shock and acute blood loss. Surgically debrided 06/02/2017 now with VAC. Plan to return to OR later in the week for split thickness skin graft.  PT/OT recommendations for SNF. CSW is following for disposition needs.                  Action/Plan: CM will continue to monitor for additional needs.   Expected Discharge Date:                  Expected Discharge Plan:  [place]  In-House Referral:  Clinical Social Work  Discharge planning Services  CM Consult  Post Acute Care Choice:  NA Choice offered to:  NA  DME Arranged:  N/A DME Agency:  NA  HH Arranged:  NA HH Agency:  NA  Status of Service:  Completed, signed off  If discussed at [employer] of Stay Meetings, dates discussed:  05-27-17, 05-29-17, 06-05-17  Additional Comments: 06/13/2017 Pt remains in ICU ventilated and on CRRT.  Ortho recommending AKA.  CM will continue to follow for discharge needs - current plan is for SNF, CSW following  0843 06-05-17 Jacqlyn Krauss, RN, BSN [phone] S/p debridement 05/25/2017-developed acute GI Bleed and underwent endoscopy. Transferred to 80M-Intubated and pressors. CM will continue to monitor for disposition needs. CSW following for SNF    1150 06/13/2017 Jacqlyn Krauss, [place] [phone] Pt post Debridement- Wound VAC in place. Plan for SNF placement once stable. CM will continue to monitor.    1629 05-27-17 Jacqlyn Krauss, RN,BSN [phone] CSW following for SNF-likely [facility] SNF once stable. Plan for Split thickness graft 05/26/2017. CM will continue to monitor.  Maryclare Labrador, RN 06/13/2017, 2:47 PM

## 2017-06-14 ENCOUNTER — Inpatient Hospital Stay (HOSPITAL_COMMUNITY): Payer: Medicare Other

## 2017-06-14 DIAGNOSIS — Z515 Encounter for palliative care: Secondary | ICD-10-CM

## 2017-06-14 DIAGNOSIS — K269 Duodenal ulcer, unspecified as acute or chronic, without hemorrhage or perforation: Secondary | ICD-10-CM

## 2017-06-14 LAB — RENAL FUNCTION PANEL
ALBUMIN: 1.2 g/dL — AB (ref 3.5–5.0)
ANION GAP: 6 (ref 5–15)
Albumin: 1.2 g/dL — ABNORMAL LOW (ref 3.5–5.0)
Anion gap: 8 (ref 5–15)
Anion gap: 5 (ref 5–15)
BUN: 9 mg/dL (ref 6–20)
BUN: 6 mg/dL (ref 6–20)
BUN: 7 mg/dL (ref 6–20)
CALCIUM: 9.7 mg/dL (ref 8.9–10.3)
CALCIUM: 9.3 mg/dL (ref 8.9–10.3)
CO2: 32 mmol/L (ref 22–32)
CO2: 25 mmol/L (ref 22–32)
CO2: 32 mmol/L (ref 22–32)
CREATININE: 0.5 mg/dL (ref 0.44–1.00)
CREATININE: 0.34 mg/dL — AB (ref 0.44–1.00)
CREATININE: 0.48 mg/dL (ref 0.44–1.00)
Calcium: 6.8 mg/dL — ABNORMAL LOW (ref 8.9–10.3)
Chloride: 96 mmol/L — ABNORMAL LOW (ref 101–111)
Chloride: 109 mmol/L (ref 101–111)
Chloride: 97 mmol/L — ABNORMAL LOW (ref 101–111)
GFR calc Af Amer: >60 mL/min (ref >60)
GFR calc Af Amer: >60 mL/min (ref >60)
GFR calc non Af Amer: >60 mL/min (ref >60)
GFR calc non Af Amer: >60 mL/min (ref >60)
GLUCOSE: 113 mg/dL — AB (ref 65–99)
GLUCOSE: 104 mg/dL — AB (ref 65–99)
GLUCOSE: 136 mg/dL — AB (ref 65–99)
PHOSPHORUS: 1.2 mg/dL — AB (ref 2.5–4.6)
PHOSPHORUS: 1.4 mg/dL — AB (ref 2.5–4.6)
Phosphorus: 1.8 mg/dL — ABNORMAL LOW (ref 2.5–4.6)
Potassium: 3.3 mmol/L — ABNORMAL LOW (ref 3.5–5.1)
Potassium: 2.4 mmol/L — CL (ref 3.5–5.1)
Potassium: 3.7 mmol/L (ref 3.5–5.1)
SODIUM: 135 mmol/L (ref 135–145)
Sodium: 136 mmol/L (ref 135–145)
Sodium: 139 mmol/L (ref 135–145)

## 2017-06-14 LAB — POCT I-STAT EG7
ACID-BASE EXCESS: 3 mmol/L — AB (ref 0.0–2.0)
Acid-Base Excess: 5 mmol/L — ABNORMAL HIGH (ref 0.0–2.0)
BICARBONATE: 33 mmol/L — AB (ref 20.0–28.0)
BICARBONATE: 32.5 mmol/L — AB (ref 20.0–28.0)
CALCIUM ION: 0.7 mmol/L — AB (ref 1.15–1.40)
Calcium, Ion: 0.74 mmol/L — CL (ref 1.15–1.40)
HEMATOCRIT: 38 % (ref 36.0–46.0)
HEMATOCRIT: 41 % (ref 36.0–46.0)
HEMOGLOBIN: 12.9 g/dL (ref 12.0–15.0)
Hemoglobin: 13.9 g/dL (ref 12.0–15.0)
O2 SAT: 45 %
O2 Saturation: 60 %
PCO2 VEN: 55.2 mmHg (ref 44.0–60.0)
PO2 VEN: 28 mmHg — AB (ref 32.0–45.0)
POTASSIUM: 3.6 mmol/L (ref 3.5–5.1)
Patient temperature: 96.9
Patient temperature: 97.5
Potassium: 3.5 mmol/L (ref 3.5–5.1)
SODIUM: 141 mmol/L (ref 135–145)
Sodium: 140 mmol/L (ref 135–145)
TCO2: 35 mmol/L — ABNORMAL HIGH (ref 22–32)
TCO2: 34 mmol/L — AB (ref 22–32)
pCO2, Ven: 74.8 mmHg (ref 44.0–60.0)
pH, Ven: 7.248 — ABNORMAL LOW (ref 7.250–7.430)
pH, Ven: 7.375 (ref 7.250–7.430)
pO2, Ven: 32 mmHg (ref 32.0–45.0)

## 2017-06-14 LAB — POCT I-STAT 7, (LYTES, BLD GAS, ICA,H+H)
ACID-BASE EXCESS: 7 mmol/L — AB (ref 0.0–2.0)
ACID-BASE EXCESS: 7 mmol/L — AB (ref 0.0–2.0)
Acid-Base Excess: 7 mmol/L — ABNORMAL HIGH (ref 0.0–2.0)
Acid-Base Excess: 5 mmol/L — ABNORMAL HIGH (ref 0.0–2.0)
BICARBONATE: 34.6 mmol/L — AB (ref 20.0–28.0)
Bicarbonate: 34.8 mmol/L — ABNORMAL HIGH (ref 20.0–28.0)
Bicarbonate: 32.7 mmol/L — ABNORMAL HIGH (ref 20.0–28.0)
Bicarbonate: 35.5 mmol/L — ABNORMAL HIGH (ref 20.0–28.0)
CALCIUM ION: 1.24 mmol/L (ref 1.15–1.40)
Calcium, Ion: 1.25 mmol/L (ref 1.15–1.40)
Calcium, Ion: 1.2 mmol/L (ref 1.15–1.40)
Calcium, Ion: 1.25 mmol/L (ref 1.15–1.40)
HCT: 33 % — ABNORMAL LOW (ref 36.0–46.0)
HCT: 34 % — ABNORMAL LOW (ref 36.0–46.0)
HEMATOCRIT: 36 % (ref 36.0–46.0)
HEMATOCRIT: 34 % — AB (ref 36.0–46.0)
HEMOGLOBIN: 12.2 g/dL (ref 12.0–15.0)
Hemoglobin: 11.2 g/dL — ABNORMAL LOW (ref 12.0–15.0)
Hemoglobin: 11.6 g/dL — ABNORMAL LOW (ref 12.0–15.0)
Hemoglobin: 11.6 g/dL — ABNORMAL LOW (ref 12.0–15.0)
O2 SAT: 98 %
O2 SAT: 99 %
O2 Saturation: 99 %
O2 Saturation: 99 %
PCO2 ART: 63.8 mmHg — AB (ref 32.0–48.0)
PCO2 ART: 66 mmHg — AB (ref 32.0–48.0)
PCO2 ART: 63 mmHg — AB (ref 32.0–48.0)
PH ART: 7.34 — AB (ref 7.350–7.450)
PO2 ART: 120 mmHg — AB (ref 83.0–108.0)
PO2 ART: 130 mmHg — AB (ref 83.0–108.0)
POTASSIUM: 3.4 mmol/L — AB (ref 3.5–5.1)
POTASSIUM: 3.3 mmol/L — AB (ref 3.5–5.1)
POTASSIUM: 3.5 mmol/L (ref 3.5–5.1)
Patient temperature: 96.9
Patient temperature: 97.5
Potassium: 3.6 mmol/L (ref 3.5–5.1)
SODIUM: 140 mmol/L (ref 135–145)
Sodium: 139 mmol/L (ref 135–145)
Sodium: 138 mmol/L (ref 135–145)
Sodium: 139 mmol/L (ref 135–145)
TCO2: 37 mmol/L — AB (ref 22–32)
TCO2: 34 mmol/L — AB (ref 22–32)
TCO2: 38 mmol/L — ABNORMAL HIGH (ref 22–32)
TCO2: 37 mmol/L — ABNORMAL HIGH (ref 22–32)
pCO2 arterial: 58.1 mmHg — ABNORMAL HIGH (ref 32.0–48.0)
pH, Arterial: 7.355 (ref 7.350–7.450)
pH, Arterial: 7.335 — ABNORMAL LOW (ref 7.350–7.450)
pH, Arterial: 7.344 — ABNORMAL LOW (ref 7.350–7.450)
pO2, Arterial: 143 mmHg — ABNORMAL HIGH (ref 83.0–108.0)
pO2, Arterial: 138 mmHg — ABNORMAL HIGH (ref 83.0–108.0)

## 2017-06-14 LAB — CBC
HCT: 35.6 % — ABNORMAL LOW (ref 36.0–46.0)
Hemoglobin: 11.6 g/dL — ABNORMAL LOW (ref 12.0–15.0)
MCH: 29.6 pg (ref 26.0–34.0)
MCHC: 32.6 g/dL (ref 30.0–36.0)
MCV: 90.8 fL (ref 78.0–100.0)
PLATELETS: 24 10*3/uL — AB (ref 150–400)
RBC: 3.92 MIL/uL (ref 3.87–5.11)
RDW: 16.4 % — AB (ref 11.5–15.5)
WBC: 10.5 10*3/uL (ref 4.0–10.5)

## 2017-06-14 LAB — MAGNESIUM: MAGNESIUM: 1.7 mg/dL (ref 1.7–2.4)

## 2017-06-14 LAB — GLUCOSE, CAPILLARY
GLUCOSE-CAPILLARY: 106 mg/dL — AB (ref 65–99)
GLUCOSE-CAPILLARY: 107 mg/dL — AB (ref 65–99)
GLUCOSE-CAPILLARY: 99 mg/dL (ref 65–99)
Glucose-Capillary: 112 mg/dL — ABNORMAL HIGH (ref 65–99)
Glucose-Capillary: 113 mg/dL — ABNORMAL HIGH (ref 65–99)
Glucose-Capillary: 126 mg/dL — ABNORMAL HIGH (ref 65–99)
Glucose-Capillary: 130 mg/dL — ABNORMAL HIGH (ref 65–99)

## 2017-06-14 MED ORDER — POTASSIUM PHOSPHATES 15 MMOLE/5ML IV SOLN
10.0000 mmol | Freq: Once | INTRAVENOUS | Status: AC
  Administered 2017-06-14: 10 mmol via INTRAVENOUS
  Filled 2017-06-14: qty 3.33

## 2017-06-14 MED ORDER — MAGNESIUM SULFATE 2 GM/50ML IV SOLN
2.0000 g | Freq: Once | INTRAVENOUS | Status: AC
  Administered 2017-06-14: 2 g via INTRAVENOUS
  Filled 2017-06-14: qty 50

## 2017-06-14 MED ORDER — POTASSIUM CHLORIDE 10 MEQ/50ML IV SOLN
10.0000 meq | INTRAVENOUS | Status: AC
Start: 2017-06-14 — End: 2017-06-14
  Administered 2017-06-14 (×2): 10 meq via INTRAVENOUS

## 2017-06-14 MED ORDER — POTASSIUM CHLORIDE 10 MEQ/50ML IV SOLN
INTRAVENOUS | Status: AC
  Filled 2017-06-14: qty 100

## 2017-06-14 MED ORDER — TRACE MINERALS CR-CU-MN-SE-ZN 10-1000-500-60 MCG/ML IV SOLN
INTRAVENOUS | Status: AC
  Administered 2017-06-14: 18:00:00 via INTRAVENOUS
  Filled 2017-06-14: qty 705.6

## 2017-06-14 NOTE — Consult Note (Signed)
Consultation Note Date: 06/14/2017   Patient Name: [name]  DOB: 24-May-1935  MRN: [id number]  Age / Sex: 82 y.o., female  PCP: Redmond School, MD Referring Physician: Raylene Miyamoto, MD  Reason for Consultation: Establishing goals of care and Psychosocial/spiritual support  HPI/Patient Profile: 82 y.o. female  with past medical history of chronic atrial fib, dCHF, CKD III, pulm HTN,  admitted on 06/15/2017 after traumatic leg injury 2/2 a fall. Her hospital coarse has been complicated by hypovolemic shock, emergent surgery 2/2 GIB; exp lap.She also has undergone several wound debridements 2/2 nec fasciitis.  She has remained on vent support in ICU and has been unable to wean as well as continued pressor support, CRRT . She is not making urine at this point. Also, bowel is not waking up; wound vac to abd incision; anasarca.She is currently not on sedation, alert. Answering simple questions with yes/no as well as writing on a pad.  Consult for GOC specifically L AKA, PEG, HD, LTACH trach .   Clinical Assessment and Goals of Care: Chart reviewed and met with pt's husband and reviewed pt's clinical condition with CCM RN. Pt facing a long protracted hospitalization , likely LTACH if full scope of treatment elected moving forward.   Pt able to participate to a limited degree but difficult to assess capacity for fully informed decision  Likely order of interventions barring any further complications is as follows: another endo, trach, PEG then amputation; all in setting of no current bowel activity, oliguria on CRRT, unable to wean from pressors which is impacting her ability to have HD,. If vent dependant on HD ( dependant on her getting off pressors and BP stable enough for HD) would need LTACH and prolonged hospitalization going forward, debility    SUMMARY OF RECOMMENDATIONS   Continue partial  code for now; no changes to plan of care yet Husband would like to have a family meeting with himself, as well as 3 daughters on Mon. PMT to schedule and conduct.Famiy would like her input if possible. She is awake and answering brief, simple questions but question her ability to make a fully informed decision 2/2 acute illness and prolonged hospitlaization  Code Status/Advance Care Planning:  Limited code    Symptom Management:   Pain: Cont with Fentanyl 25-100 q2 PRN  Palliative Prophylaxis:   Aspiration, Bowel Regimen, Delirium Protocol, Eye Care, Frequent Pain Assessment, Oral Care and Turn Reposition  Additional Recommendations (Limitations, Scope, Preferences):  Full Scope Treatment except for no CPR. Pt is already intubated  Psycho-social/Spiritual:   Desire for further Chaplaincy support:no  Additional Recommendations: ICU Family Guide  Prognosis:   Unable to determine  Discharge Planning: To Be Determined      Primary Diagnoses: Present on Admission: . Acute on chronic diastolic CHF (congestive heart failure) ([place]) . Hypotension   I have reviewed the medical record, interviewed the patient and family, and examined the patient. The following aspects are pertinent.  Past Medical History:  Diagnosis Date  . A-fib ([place])   .  Arthritis    "knees" (11/17/2015)  . CAD (coronary artery disease)    BMS to LAD 07/2005  . Chronic atrial fibrillation ([place])    Since 2011  . Degenerative arthritis of right knee   . Essential hypertension   . GERD (gastroesophageal reflux disease)   . Hyperlipidemia   . Morbid obesity ([place])   . Myocardial infarction ([place]) 07/2005  . On home oxygen therapy    "2.5L at night" (11/17/2015)  . Psoriasis   . Pulmonary hypertension ([facility])    Mixed, PASP 67 mmHg at [facility] 2013  . Right ventricular dysfunction   . Tricuspid regurgitation    Severe   Social History   Social History  . Marital status: Married    Spouse name: N/A  .  Number of children: 2  . Years of education: N/A   Occupational History  . retired Retired   Social History Main Topics  . Smoking status: Former Smoker    Packs/day: 0.30    Years: 0.50    Types: Cigarettes    Quit date: 08/19/1986  . Smokeless tobacco: Never Used     Comment: pt only smoked for only a few months.  . Alcohol use No  . Drug use: No  . Sexual activity: No   Other Topics Concern  . None   Social History Narrative   [place] and [place]. 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 They both stay active around the farm, maintaining lawn and garden.   Family History  Problem Relation Age of Onset  . Heart attack Mother   . Coronary artery disease Father   . Stroke Father   . Heart attack Brother   . Heart failure Brother   . COPD Brother   . Emphysema Sister   . Emphysema Brother    Scheduled Meds: . brimonidine  1 drop Right Eye BID  . brinzolamide  1 drop Both Eyes BID  . chlorhexidine gluconate (MEDLINE KIT)  15 mL Mouth Rinse BID  . Chlorhexidine Gluconate Cloth  6 each Topical Daily  . docusate  100 mg Oral BID  . Gerhardt's butt cream   Topical BID  . insulin aspart  0-15 Units Subcutaneous Q4H  . latanoprost  1 drop Right Eye QHS  . mouth rinse  15 mL Mouth Rinse QID  . pantoprazole (PROTONIX) IV  40 mg Intravenous Q12H  . sodium chloride flush  3 mL Intravenous Q12H  . timolol  1 drop Both Eyes BID   Continuous Infusions: . sodium chloride    . sodium chloride 10 mL/hr at 06/13/17 2000  . sodium chloride    . calcium gluconate infusion for CRRT 20 g (06/13/17 2000)  . DAPTOmycin (CUBICIN)  IV Stopped (06/13/17 1530)  . methocarbamol (ROBAXIN)  IV    . norepinephrine (LEVOPHED) Adult infusion 14 mcg/min (06/14/17 0941)  . piperacillin-tazobactam Stopped (06/14/17 0831)  . dialysis replacement fluid (prismasate) 800 mL/hr at 06/14/17 0252  . dialysate (PRISMASATE) 2,000  mL/hr at 06/14/17 0931  . sodium chloride    . sodium citrate 2 %/dextrose 2.5% solution 3000 mL 250 mL/hr at 06/14/17 0011  . TPN ADULT (ION) 35 mL/hr at 06/13/17 2000  . TPN ADULT (ION)     PRN Meds:.sodium chloride, Place/Maintain arterial line **AND** sodium chloride, bisacodyl, fentaNYL (SUBLIMAZE) injection, heparin, methocarbamol **OR** methocarbamol (ROBAXIN)  IV, metoCLOPramide **OR** metoCLOPramide (REGLAN) injection, ondansetron **OR** ondansetron (ZOFRAN) IV, polyethylene glycol,  sodium chloride, sodium chloride flush, sodium chloride flush Medications Prior to Admission:  Prior to Admission medications   Medication Sig Start Date End Date Taking? Authorizing Provider  ALPRAZolam Duanne Moron) 0.5 MG tablet Take 1 tablet (0.5 mg total) by mouth at bedtime. 05/16/17  Yes Arrien, Jimmy Picket, MD  ambrisentan (LETAIRIS) 5 MG tablet Take 1 tablet (5 mg total) by mouth daily. 05/21/17  Yes Collene Gobble, MD  atorvastatin (LIPITOR) 80 MG tablet TAKE 1/2 TABLET BY MOUTH DAILY Patient taking differently: TAKE 1/2 TABLET (40 MG) BY MOUTH DAILY AT BEDTIME 08/07/15  Yes Satira Sark, MD  azelastine (ASTELIN) 0.1 % nasal spray Place 2 sprays into both nostrils 2 (two) times daily as needed (congestion).  02/21/17  Yes [provider]  brimonidine (ALPHAGAN) 0.2 % ophthalmic solution Place 1 drop into the right eye 2 (two) times daily.  04/21/15  Yes [provider]  brinzolamide (AZOPT) 1 % ophthalmic suspension Place 1 drop into both eyes 2 (two) times daily.   Yes [provider]  HYDROcodone-acetaminophen (NORCO/VICODIN) 5-325 MG tablet Take 1 tablet by mouth 3 (three) times daily as needed for moderate pain. 05/16/17  Yes Arrien, Jimmy Picket, MD  latanoprost (XALATAN) 0.005 % ophthalmic solution Place 1 drop into the right eye at bedtime.  03/30/12  Yes [provider]  nitroGLYCERIN (NITROSTAT) 0.4 MG SL tablet Place 0.4 mg under the tongue every 5 (five)  minutes as needed for chest pain (x 3 tablets daily). Reported on 12/28/2015   Yes [provider]  Omeprazole 20 MG TBEC Take 20 mg by mouth daily.    Yes [provider]  OXYGEN Inhale 2.5 L into the lungs at bedtime.   Yes [provider]  timolol (TIMOPTIC) 0.5 % ophthalmic solution Place 1 drop into both eyes 2 (two) times daily. 11/20/15  Yes [provider]   No Known Allergies Review of Systems  Unable to perform ROS: Intubated    Physical Exam  Constitutional: She appears well-developed and well-nourished.  Acutely ill appearing female; intubated, on CRRT, TPN and pressors. She is alert and nodding yes/no  HENT:  Head: Normocephalic and atraumatic.  Cardiovascular:  On pressors; difficulty weaning  Pulmonary/Chest:  On ventilator. Not weaning well  Abdominal:  Wound vac  Neurological: She is alert.  Skin: Skin is warm and dry. There is pallor.  On barehugger  Psychiatric:  No agitation  Nursing note and vitals reviewed.   Vital Signs: BP (!) 112/54   Pulse (!) 56   Temp (!) 96.8 F (36 C) (Axillary)   Resp (!) 27   Ht 5' 2.99" (1.6 m)   Wt 105.1 kg (231 lb 11.3 oz)   SpO2 100%   BMI 41.05 kg/m  Pain Assessment: CPOT POSS *See Group Information*: 1-Acceptable,Awake and alert Pain Score: 0-No pain   SpO2: SpO2: 100 % O2 Device:SpO2: 100 % O2 Flow Rate: .O2 Flow Rate (L/min): 40 L/min  IO: Intake/output summary:  Intake/Output Summary (Last 24 hours) at 06/14/17 0950 Last data filed at 06/14/17 0900  Gross per 24 hour  Intake          3308.36 ml  Output             5197 ml  Net         -1888.64 ml    LBM: Last BM Date: 06/11/17 Baseline Weight: Weight: 101.6 kg (224 lb) Most recent weight: Weight: 105.1 kg (231 lb 11.3 oz)  Palliative Assessment/Data:   Flowsheet Rows     Most Recent Value  Intake Tab  Referral Department  Critical care  Unit at Time of Referral  ICU  Palliative Care Primary Diagnosis   Trauma  Date Notified  06/13/17  Palliative Care Type  New Palliative care  Reason for referral  End of Life Care Assistance, Clarify Goals of Care  Date of Admission  04/19/17  Date first seen by Palliative Care  06/14/17  # of days Palliative referral response time  1 Day(s)  # of days IP prior to Palliative referral  55  Clinical Assessment  Palliative Performance Scale Score  30%  Pain Max last 24 hours  Not able to report  Pain Min Last 24 hours  Not able to report  Dyspnea Max Last 24 Hours  Not able to report  Dyspnea Min Last 24 hours  Not able to report  Nausea Max Last 24 Hours  Not able to report  Nausea Min Last 24 Hours  Not able to report  Anxiety Max Last 24 Hours  Not able to report  Anxiety Min Last 24 Hours  Not able to report  Other Max Last 24 Hours  Not able to report  Psychosocial & Spiritual Assessment  Palliative Care Outcomes  Patient/Family meeting held?  Yes  Who was at the meeting?  husband  Palliative Care follow-up planned  Yes, Facility      Time In: 0900 Time Out: 1030 Time Total: 90 min Greater than 50%  of this time was spent counseling and coordinating care related to the above assessment and plan. Staffed with CCM RN and attending  Signed by: Dory Horn, NP   Please contact Palliative Medicine Team phone at [phone] for questions and concerns.  For individual provider: See Shea Evans

## 2017-06-14 NOTE — Progress Notes (Signed)
Patient ID: [name], female   DOB: 1935-08-01, 82 y.o.   MRN: [id number] [facility] Progress Note:   10 Days Post-Op  Subjective: Mental status is on ventilator with HD in progress Objective: Vital signs in last 24 hours: Temp:  [96.4 F (35.8 C)-96.9 F (36.1 C)] 96.8 F (36 C) (10/27 0830) Pulse Rate:  [48-72] 56 (10/27 1100) Resp:  [15-31] 15 (10/27 1100) BP: (85-138)/(52-121) 110/65 (10/27 1100) SpO2:  [85 %-100 %] 100 % (10/27 1100) Arterial Line BP: (87-128)/(37-67) 96/37 (10/27 1100) FiO2 (%):  [40 %] 40 % (10/27 0800) Weight:  [105.1 kg (231 lb 11.3 oz)] 105.1 kg (231 lb 11.3 oz) (10/27 0400)  Intake/Output from previous day: 10/26 0701 - 10/27 0700 In: 3472.7 [I.V.:2712.4; NG/GT:150; IV Piggyback:610.3] Out: 5336 [Emesis/NG output:350; Drains:430] Intake/Output this shift: Total I/O In: 413.1 [I.V.:393.1; NG/GT:20] Out: 689 [Drains:100; Other:589]  Physical Exam: Work of breathing is on vent.  VAC in place  Lab Results:  Results for orders placed or performed during the hospital encounter of 05/31/2017 (from the past 48 hour(s))  Glucose, capillary     Status: Abnormal   Collection Time: 06/12/17 12:34 PM  Result Value Ref Range   Glucose-Capillary 117 (H) 65 - 99 mg/dL   Comment 1 Capillary Specimen   I-STAT, chem 8     Status: Abnormal   Collection Time: 06/12/17 12:38 PM  Result Value Ref Range   Sodium 137 135 - 145 mmol/L   Potassium 3.4 (L) 3.5 - 5.1 mmol/L   Chloride 93 (L) 101 - 111 mmol/L   BUN 15 6 - 20 mg/dL   Creatinine, Ser 0.80 0.44 - 1.00 mg/dL   Glucose, Bld 138 (H) 65 - 99 mg/dL   Calcium, Ion 1.27 1.15 - 1.40 mmol/L   TCO2 31 22 - 32 mmol/L   Hemoglobin 12.2 12.0 - 15.0 g/dL   HCT 36.0 36.0 - 46.0 %  I-STAT, chem 8     Status: Abnormal   Collection Time: 06/12/17 12:42 PM  Result Value Ref Range   Sodium 139 135 - 145 mmol/L   Potassium 3.6 3.5 - 5.1 mmol/L   Chloride 90 (L) 101 - 111 mmol/L   BUN 11 6 - 20 mg/dL    Creatinine, Ser 0.60 0.44 - 1.00 mg/dL   Glucose, Bld 186 (H) 65 - 99 mg/dL   Calcium, Ion 0.56 (LL) 1.15 - 1.40 mmol/L   TCO2 29 22 - 32 mmol/L   Hemoglobin 14.3 12.0 - 15.0 g/dL   HCT 42.0 36.0 - 46.0 %   Comment VALUES EXPECTED, NO REPEAT   Renal function panel (daily at 1600)     Status: Abnormal   Collection Time: 06/12/17  3:29 PM  Result Value Ref Range   Sodium 134 (L) 135 - 145 mmol/L   Potassium 3.5 3.5 - 5.1 mmol/L   Chloride 96 (L) 101 - 111 mmol/L   CO2 31 22 - 32 mmol/L   Glucose, Bld 141 (H) 65 - 99 mg/dL   BUN 13 6 - 20 mg/dL   Creatinine, Ser 0.82 0.44 - 1.00 mg/dL   Calcium 9.8 8.9 - 10.3 mg/dL   Phosphorus 2.1 (L) 2.5 - 4.6 mg/dL   Albumin 1.2 (L) 3.5 - 5.0 g/dL   GFR calc non Af Amer >60 >60 mL/min   GFR calc Af Amer >60 >60 mL/min    Comment: (NOTE) The eGFR has been calculated using the CKD EPI equation. This calculation has not been  validated in all clinical situations. eGFR's persistently <60 mL/min signify possible Chronic Kidney Disease.    Anion gap 7 5 - 15  Glucose, capillary     Status: Abnormal   Collection Time: 06/12/17  3:31 PM  Result Value Ref Range   Glucose-Capillary 147 (H) 65 - 99 mg/dL   Comment 1 Capillary Specimen   I-STAT, chem 8     Status: Abnormal   Collection Time: 06/12/17  3:36 PM  Result Value Ref Range   Sodium 137 135 - 145 mmol/L   Potassium 3.5 3.5 - 5.1 mmol/L   Chloride 92 (L) 101 - 111 mmol/L   BUN 14 6 - 20 mg/dL   Creatinine, Ser 0.80 0.44 - 1.00 mg/dL   Glucose, Bld 144 (H) 65 - 99 mg/dL   Calcium, Ion 1.30 1.15 - 1.40 mmol/L   TCO2 32 22 - 32 mmol/L   Hemoglobin 10.5 (L) 12.0 - 15.0 g/dL   HCT 31.0 (L) 36.0 - 46.0 %  I-STAT, chem 8     Status: Abnormal   Collection Time: 06/12/17  3:40 PM  Result Value Ref Range   Sodium 139 135 - 145 mmol/L   Potassium 3.5 3.5 - 5.1 mmol/L   Chloride 90 (L) 101 - 111 mmol/L   BUN 10 6 - 20 mg/dL   Creatinine, Ser 0.50 0.44 - 1.00 mg/dL   Glucose, Bld 186 (H) 65 -  99 mg/dL   Calcium, Ion 0.61 (LL) 1.15 - 1.40 mmol/L   TCO2 30 22 - 32 mmol/L   Hemoglobin 14.3 12.0 - 15.0 g/dL   HCT 42.0 36.0 - 46.0 %   Comment VALUES EXPECTED, NO REPEAT   Glucose, capillary     Status: Abnormal   Collection Time: 06/12/17  7:56 PM  Result Value Ref Range   Glucose-Capillary 145 (H) 65 - 99 mg/dL   Comment 1 Notify RN   I-STAT, chem 8     Status: Abnormal   Collection Time: 06/12/17  8:47 PM  Result Value Ref Range   Sodium 139 135 - 145 mmol/L   Potassium 3.6 3.5 - 5.1 mmol/L   Chloride 90 (L) 101 - 111 mmol/L   BUN 10 6 - 20 mg/dL   Creatinine, Ser 0.50 0.44 - 1.00 mg/dL   Glucose, Bld 186 (H) 65 - 99 mg/dL   Calcium, Ion 0.59 (LL) 1.15 - 1.40 mmol/L   TCO2 29 22 - 32 mmol/L   Hemoglobin 13.9 12.0 - 15.0 g/dL   HCT 41.0 36.0 - 46.0 %  I-STAT, chem 8     Status: Abnormal   Collection Time: 06/12/17  8:51 PM  Result Value Ref Range   Sodium 138 135 - 145 mmol/L   Potassium 3.4 (L) 3.5 - 5.1 mmol/L   Chloride 90 (L) 101 - 111 mmol/L   BUN 12 6 - 20 mg/dL   Creatinine, Ser 0.60 0.44 - 1.00 mg/dL   Glucose, Bld 145 (H) 65 - 99 mg/dL   Calcium, Ion 1.04 (L) 1.15 - 1.40 mmol/L   TCO2 30 22 - 32 mmol/L   Hemoglobin 11.9 (L) 12.0 - 15.0 g/dL   HCT 35.0 (L) 36.0 - 46.0 %  Glucose, capillary     Status: Abnormal   Collection Time: 06/12/17 11:58 PM  Result Value Ref Range   Glucose-Capillary 123 (H) 65 - 99 mg/dL   Comment 1 Capillary Specimen   I-STAT, chem 8     Status: Abnormal   Collection Time: 06/13/17  12:12 AM  Result Value Ref Range   Sodium 139 135 - 145 mmol/L   Potassium 3.6 3.5 - 5.1 mmol/L   Chloride 92 (L) 101 - 111 mmol/L   BUN 9 6 - 20 mg/dL   Creatinine, Ser 0.50 0.44 - 1.00 mg/dL   Glucose, Bld 174 (H) 65 - 99 mg/dL   Calcium, Ion 0.69 (LL) 1.15 - 1.40 mmol/L   TCO2 31 22 - 32 mmol/L   Hemoglobin 14.3 12.0 - 15.0 g/dL   HCT 42.0 36.0 - 46.0 %   Comment VALUES EXPECTED, NO REPEAT   I-STAT, chem 8     Status: Abnormal   Collection  Time: 06/13/17 12:19 AM  Result Value Ref Range   Sodium 138 135 - 145 mmol/L   Potassium 3.3 (L) 3.5 - 5.1 mmol/L   Chloride 95 (L) 101 - 111 mmol/L   BUN 12 6 - 20 mg/dL   Creatinine, Ser 0.60 0.44 - 1.00 mg/dL   Glucose, Bld 132 (H) 65 - 99 mg/dL   Calcium, Ion 1.25 1.15 - 1.40 mmol/L   TCO2 31 22 - 32 mmol/L   Hemoglobin 11.2 (L) 12.0 - 15.0 g/dL   HCT 33.0 (L) 36.0 - 46.0 %  Glucose, capillary     Status: Abnormal   Collection Time: 06/13/17  3:44 AM  Result Value Ref Range   Glucose-Capillary 123 (H) 65 - 99 mg/dL   Comment 1 Capillary Specimen   I-STAT, chem 8     Status: Abnormal   Collection Time: 06/13/17  4:33 AM  Result Value Ref Range   Sodium 140 135 - 145 mmol/L   Potassium 3.6 3.5 - 5.1 mmol/L   Chloride 91 (L) 101 - 111 mmol/L   BUN 8 6 - 20 mg/dL   Creatinine, Ser 0.40 (L) 0.44 - 1.00 mg/dL   Glucose, Bld 170 (H) 65 - 99 mg/dL   Calcium, Ion 0.61 (LL) 1.15 - 1.40 mmol/L   TCO2 30 22 - 32 mmol/L   Hemoglobin 13.9 12.0 - 15.0 g/dL   HCT 41.0 36.0 - 46.0 %   Comment VALUES EXPECTED, NO REPEAT   Renal function panel (daily at 0500)     Status: Abnormal   Collection Time: 06/13/17  4:38 AM  Result Value Ref Range   Sodium 136 135 - 145 mmol/L   Potassium 3.4 (L) 3.5 - 5.1 mmol/L   Chloride 96 (L) 101 - 111 mmol/L   CO2 33 (H) 22 - 32 mmol/L   Glucose, Bld 127 (H) 65 - 99 mg/dL   BUN 13 6 - 20 mg/dL   Creatinine, Ser 0.71 0.44 - 1.00 mg/dL   Calcium 10.2 8.9 - 10.3 mg/dL   Phosphorus 1.6 (L) 2.5 - 4.6 mg/dL   Albumin 1.2 (L) 3.5 - 5.0 g/dL   GFR calc non Af Amer >60 >60 mL/min   GFR calc Af Amer >60 >60 mL/min    Comment: (NOTE) The eGFR has been calculated using the CKD EPI equation. This calculation has not been validated in all clinical situations. eGFR's persistently <60 mL/min signify possible Chronic Kidney Disease.    Anion gap 7 5 - 15  CBC     Status: Abnormal   Collection Time: 06/13/17  4:38 AM  Result Value Ref Range   WBC 9.1 4.0 - 10.5  K/uL   RBC 4.03 3.87 - 5.11 MIL/uL   Hemoglobin 11.8 (L) 12.0 - 15.0 g/dL   HCT 36.7 36.0 - 46.0 %  MCV 91.1 78.0 - 100.0 fL   MCH 29.3 26.0 - 34.0 pg   MCHC 32.2 30.0 - 36.0 g/dL   RDW 16.2 (H) 11.5 - 15.5 %   Platelets 23 (LL) 150 - 400 K/uL    Comment: REPEATED TO VERIFY CRITICAL VALUE NOTED.  VALUE IS CONSISTENT WITH PREVIOUSLY REPORTED AND CALLED VALUE. SPECIMEN CHECKED FOR CLOTS   CK     Status: Abnormal   Collection Time: 06/13/17  4:38 AM  Result Value Ref Range   Total CK 15 (L) 38 - 234 U/L  Magnesium     Status: None   Collection Time: 06/13/17  4:38 AM  Result Value Ref Range   Magnesium 1.8 1.7 - 2.4 mg/dL  I-STAT, chem 8     Status: Abnormal   Collection Time: 06/13/17  4:40 AM  Result Value Ref Range   Sodium 137 135 - 145 mmol/L   Potassium 3.5 3.5 - 5.1 mmol/L   Chloride 93 (L) 101 - 111 mmol/L   BUN 12 6 - 20 mg/dL   Creatinine, Ser 0.60 0.44 - 1.00 mg/dL   Glucose, Bld 129 (H) 65 - 99 mg/dL   Calcium, Ion 1.33 1.15 - 1.40 mmol/L   TCO2 31 22 - 32 mmol/L   Hemoglobin 11.9 (L) 12.0 - 15.0 g/dL   HCT 35.0 (L) 36.0 - 46.0 %  Glucose, capillary     Status: Abnormal   Collection Time: 06/13/17  7:48 AM  Result Value Ref Range   Glucose-Capillary 110 (H) 65 - 99 mg/dL   Comment 1 Capillary Specimen    Comment 2 Notify RN   POCT I-Stat EG7     Status: Abnormal   Collection Time: 06/13/17 10:27 AM  Result Value Ref Range   pH, Ven 7.400 7.250 - 7.430   pCO2, Ven 50.8 44.0 - 60.0 mmHg   pO2, Ven 26.0 (LL) 32.0 - 45.0 mmHg   Bicarbonate 31.9 (H) 20.0 - 28.0 mmol/L   TCO2 33 (H) 22 - 32 mmol/L   O2 Saturation 51.0 %   Acid-Base Excess 5.0 (H) 0.0 - 2.0 mmol/L   Sodium 139 135 - 145 mmol/L   Potassium 3.5 3.5 - 5.1 mmol/L   Calcium, Ion 0.72 (LL) 1.15 - 1.40 mmol/L   HCT 40.0 36.0 - 46.0 %   Hemoglobin 13.6 12.0 - 15.0 g/dL   Patient temperature 96.2 F    Collection site CRRT    Drawn by Operator    Sample type VENOUS   POCT I-Stat EG7     Status:  Abnormal   Collection Time: 06/13/17 10:32 AM  Result Value Ref Range   pH, Ven 7.278 7.250 - 7.430   pCO2, Ven 72.0 (HH) 44.0 - 60.0 mmHg   pO2, Ven 28.0 (LL) 32.0 - 45.0 mmHg   Bicarbonate 34.2 (H) 20.0 - 28.0 mmol/L   TCO2 37 (H) 22 - 32 mmol/L   O2 Saturation 47.0 %   Acid-Base Excess 5.0 (H) 0.0 - 2.0 mmol/L   Sodium 137 135 - 145 mmol/L   Potassium 3.2 (L) 3.5 - 5.1 mmol/L   Calcium, Ion 0.94 (L) 1.15 - 1.40 mmol/L   HCT 34.0 (L) 36.0 - 46.0 %   Hemoglobin 11.6 (L) 12.0 - 15.0 g/dL   Patient temperature 96.2 F    Collection site [employer] by Operator    Sample type VENOUS   Glucose, capillary     Status: Abnormal   Collection Time: 06/13/17  11:40 AM  Result Value Ref Range   Glucose-Capillary 127 (H) 65 - 99 mg/dL   Comment 1 Capillary Specimen    Comment 2 Notify RN   POCT I-Stat EG7     Status: Abnormal   Collection Time: 06/13/17  2:02 PM  Result Value Ref Range   pH, Ven 7.297 7.250 - 7.430   pCO2, Ven 66.2 (H) 44.0 - 60.0 mmHg   pO2, Ven 29.0 (LL) 32.0 - 45.0 mmHg   Bicarbonate 32.5 (H) 20.0 - 28.0 mmol/L   TCO2 34 (H) 22 - 32 mmol/L   O2 Saturation 48.0 %   Acid-Base Excess 4.0 (H) 0.0 - 2.0 mmol/L   Sodium 140 135 - 145 mmol/L   Potassium 3.4 (L) 3.5 - 5.1 mmol/L   Calcium, Ion 0.80 (LL) 1.15 - 1.40 mmol/L   HCT 35.0 (L) 36.0 - 46.0 %   Hemoglobin 11.9 (L) 12.0 - 15.0 g/dL   Patient temperature 98.0 F    Collection site CRRT    Drawn by Operator    Sample type VENOUS   I-STAT 7, (LYTES, BLD GAS, ICA, H+H)     Status: Abnormal   Collection Time: 06/13/17  2:06 PM  Result Value Ref Range   pH, Arterial 7.364 7.350 - 7.450   pCO2 arterial 58.7 (H) 32.0 - 48.0 mmHg   pO2, Arterial 120.0 (H) 83.0 - 108.0 mmHg   Bicarbonate 33.6 (H) 20.0 - 28.0 mmol/L   TCO2 35 (H) 22 - 32 mmol/L   O2 Saturation 98.0 %   Acid-Base Excess 7.0 (H) 0.0 - 2.0 mmol/L   Sodium 138 135 - 145 mmol/L   Potassium 3.2 (L) 3.5 - 5.1 mmol/L   Calcium, Ion 1.37 1.15 - 1.40  mmol/L   HCT 32.0 (L) 36.0 - 46.0 %   Hemoglobin 10.9 (L) 12.0 - 15.0 g/dL   Patient temperature 98.0 F    Collection site [employer] by Operator    Sample type ARTERIAL   Glucose, capillary     Status: None   Collection Time: 06/13/17  3:31 PM  Result Value Ref Range   Glucose-Capillary 96 65 - 99 mg/dL   Comment 1 Capillary Specimen    Comment 2 Notify RN   Renal function panel (daily at 1600)     Status: Abnormal   Collection Time: 06/13/17  4:14 PM  Result Value Ref Range   Sodium 136 135 - 145 mmol/L   Potassium 3.3 (L) 3.5 - 5.1 mmol/L   Chloride 97 (L) 101 - 111 mmol/L   CO2 31 22 - 32 mmol/L   Glucose, Bld 114 (H) 65 - 99 mg/dL   BUN 10 6 - 20 mg/dL   Creatinine, Ser 0.64 0.44 - 1.00 mg/dL   Calcium 10.0 8.9 - 10.3 mg/dL   Phosphorus 2.1 (L) 2.5 - 4.6 mg/dL   Albumin 1.2 (L) 3.5 - 5.0 g/dL   GFR calc non Af Amer >60 >60 mL/min   GFR calc Af Amer >60 >60 mL/min    Comment: (NOTE) The eGFR has been calculated using the CKD EPI equation. This calculation has not been validated in all clinical situations. eGFR's persistently <60 mL/min signify possible Chronic Kidney Disease.    Anion gap 8 5 - 15  POCT I-Stat EG7     Status: Abnormal   Collection Time: 06/13/17  6:26 PM  Result Value Ref Range   pH, Ven 7.280 7.250 - 7.430   pCO2, Ven 70.6 (  HH) 44.0 - 60.0 mmHg   pO2, Ven 22.0 (LL) 32.0 - 45.0 mmHg   Bicarbonate 33.3 (H) 20.0 - 28.0 mmol/L   TCO2 35 (H) 22 - 32 mmol/L   O2 Saturation 30.0 %   Acid-Base Excess 4.0 (H) 0.0 - 2.0 mmol/L   Sodium 139 135 - 145 mmol/L   Potassium 3.4 (L) 3.5 - 5.1 mmol/L   Calcium, Ion 0.74 (LL) 1.15 - 1.40 mmol/L   HCT 39.0 36.0 - 46.0 %   Hemoglobin 13.3 12.0 - 15.0 g/dL   Patient temperature 98.0 F    Collection site CRRT    Drawn by Operator    Sample type VENOUS   I-STAT 7, (LYTES, BLD GAS, ICA, H+H)     Status: Abnormal   Collection Time: 06/13/17  6:34 PM  Result Value Ref Range   pH, Arterial 7.337 (L) 7.350  - 7.450   pCO2 arterial 61.4 (H) 32.0 - 48.0 mmHg   pO2, Arterial 126.0 (H) 83.0 - 108.0 mmHg   Bicarbonate 33.0 (H) 20.0 - 28.0 mmol/L   TCO2 35 (H) 22 - 32 mmol/L   O2 Saturation 99.0 %   Acid-Base Excess 6.0 (H) 0.0 - 2.0 mmol/L   Sodium 138 135 - 145 mmol/L   Potassium 3.2 (L) 3.5 - 5.1 mmol/L   Calcium, Ion 1.27 1.15 - 1.40 mmol/L   HCT 33.0 (L) 36.0 - 46.0 %   Hemoglobin 11.2 (L) 12.0 - 15.0 g/dL   Patient temperature 98.0 F    Collection site [employer] by Operator    Sample type ARTERIAL   Glucose, capillary     Status: Abnormal   Collection Time: 06/13/17  8:32 PM  Result Value Ref Range   Glucose-Capillary 121 (H) 65 - 99 mg/dL   Comment 1 Capillary Specimen    Comment 2 Notify RN   I-STAT 7, (LYTES, BLD GAS, ICA, H+H)     Status: Abnormal   Collection Time: 06/13/17 10:28 PM  Result Value Ref Range   pH, Arterial 7.337 (L) 7.350 - 7.450   pCO2 arterial 61.1 (H) 32.0 - 48.0 mmHg   pO2, Arterial 128.0 (H) 83.0 - 108.0 mmHg   Bicarbonate 33.1 (H) 20.0 - 28.0 mmol/L   TCO2 35 (H) 22 - 32 mmol/L   O2 Saturation 99.0 %   Acid-Base Excess 5.0 (H) 0.0 - 2.0 mmol/L   Sodium 138 135 - 145 mmol/L   Potassium 3.4 (L) 3.5 - 5.1 mmol/L   Calcium, Ion 1.23 1.15 - 1.40 mmol/L   HCT 33.0 (L) 36.0 - 46.0 %   Hemoglobin 11.2 (L) 12.0 - 15.0 g/dL   Patient temperature 96.7 F    Collection site ARTERIAL LINE    Drawn by Operator    Sample type ARTERIAL   POCT I-Stat EG7     Status: Abnormal   Collection Time: 06/13/17 10:33 PM  Result Value Ref Range   pH, Ven 7.254 7.250 - 7.430   pCO2, Ven 74.4 (HH) 44.0 - 60.0 mmHg   pO2, Ven 29.0 (LL) 32.0 - 45.0 mmHg   Bicarbonate 33.4 (H) 20.0 - 28.0 mmol/L   TCO2 36 (H) 22 - 32 mmol/L   O2 Saturation 46.0 %   Acid-Base Excess 4.0 (H) 0.0 - 2.0 mmol/L   Sodium 140 135 - 145 mmol/L   Potassium 3.5 3.5 - 5.1 mmol/L   Calcium, Ion 0.72 (LL) 1.15 - 1.40 mmol/L   HCT 38.0 36.0 - 46.0 %  Hemoglobin 12.9 12.0 - 15.0 g/dL    Patient temperature 96.7 F    Collection site CRRT    Drawn by Operator    Sample type VENOUS   Glucose, capillary     Status: Abnormal   Collection Time: 06/14/17 12:01 AM  Result Value Ref Range   Glucose-Capillary 126 (H) 65 - 99 mg/dL   Comment 1 Capillary Specimen    Comment 2 Notify RN   Renal function panel (daily at 0500)     Status: Abnormal   Collection Time: 06/14/17  3:12 AM  Result Value Ref Range   Sodium 136 135 - 145 mmol/L   Potassium 3.3 (L) 3.5 - 5.1 mmol/L   Chloride 96 (L) 101 - 111 mmol/L   CO2 32 22 - 32 mmol/L   Glucose, Bld 113 (H) 65 - 99 mg/dL   BUN 9 6 - 20 mg/dL   Creatinine, Ser 0.50 0.44 - 1.00 mg/dL   Calcium 9.7 8.9 - 10.3 mg/dL   Phosphorus 1.8 (L) 2.5 - 4.6 mg/dL   Albumin 1.2 (L) 3.5 - 5.0 g/dL   GFR calc non Af Amer >60 >60 mL/min   GFR calc Af Amer >60 >60 mL/min    Comment: (NOTE) The eGFR has been calculated using the CKD EPI equation. This calculation has not been validated in all clinical situations. eGFR's persistently <60 mL/min signify possible Chronic Kidney Disease.    Anion gap 8 5 - 15  CBC     Status: Abnormal   Collection Time: 06/14/17  3:12 AM  Result Value Ref Range   WBC 10.5 4.0 - 10.5 K/uL   RBC 3.92 3.87 - 5.11 MIL/uL   Hemoglobin 11.6 (L) 12.0 - 15.0 g/dL   HCT 35.6 (L) 36.0 - 46.0 %   MCV 90.8 78.0 - 100.0 fL   MCH 29.6 26.0 - 34.0 pg   MCHC 32.6 30.0 - 36.0 g/dL   RDW 16.4 (H) 11.5 - 15.5 %   Platelets 24 (LL) 150 - 400 K/uL    Comment: REPEATED TO VERIFY CRITICAL VALUE NOTED.  VALUE IS CONSISTENT WITH PREVIOUSLY REPORTED AND CALLED VALUE.   Magnesium     Status: None   Collection Time: 06/14/17  3:12 AM  Result Value Ref Range   Magnesium 1.7 1.7 - 2.4 mg/dL  Glucose, capillary     Status: Abnormal   Collection Time: 06/14/17  4:12 AM  Result Value Ref Range   Glucose-Capillary 107 (H) 65 - 99 mg/dL   Comment 1 Capillary Specimen    Comment 2 Notify RN   I-STAT 7, (LYTES, BLD GAS, ICA, H+H)      Status: Abnormal   Collection Time: 06/14/17  6:16 AM  Result Value Ref Range   pH, Arterial 7.340 (L) 7.350 - 7.450   pCO2 arterial 63.8 (H) 32.0 - 48.0 mmHg   pO2, Arterial 143.0 (H) 83.0 - 108.0 mmHg   Bicarbonate 34.8 (H) 20.0 - 28.0 mmol/L   TCO2 37 (H) 22 - 32 mmol/L   O2 Saturation 99.0 %   Acid-Base Excess 7.0 (H) 0.0 - 2.0 mmol/L   Sodium 139 135 - 145 mmol/L   Potassium 3.4 (L) 3.5 - 5.1 mmol/L   Calcium, Ion 1.25 1.15 - 1.40 mmol/L   HCT 33.0 (L) 36.0 - 46.0 %   Hemoglobin 11.2 (L) 12.0 - 15.0 g/dL   Patient temperature 96.9 F    Collection site ARTERIAL LINE    Drawn by Mining engineer  Sample type ARTERIAL    Comment NOTIFIED PHYSICIAN   POCT I-Stat EG7     Status: Abnormal   Collection Time: 06/14/17  6:20 AM  Result Value Ref Range   pH, Ven 7.248 (L) 7.250 - 7.430   pCO2, Ven 74.8 (HH) 44.0 - 60.0 mmHg   pO2, Ven 28.0 (LL) 32.0 - 45.0 mmHg   Bicarbonate 33.0 (H) 20.0 - 28.0 mmol/L   TCO2 35 (H) 22 - 32 mmol/L   O2 Saturation 45.0 %   Acid-Base Excess 3.0 (H) 0.0 - 2.0 mmol/L   Sodium 141 135 - 145 mmol/L   Potassium 3.5 3.5 - 5.1 mmol/L   Calcium, Ion 0.70 (LL) 1.15 - 1.40 mmol/L   HCT 38.0 36.0 - 46.0 %   Hemoglobin 12.9 12.0 - 15.0 g/dL   Patient temperature 96.9 F    Collection site CRRT    Drawn by Operator    Sample type VENOUS   Glucose, capillary     Status: Abnormal   Collection Time: 06/14/17  8:30 AM  Result Value Ref Range   Glucose-Capillary 113 (H) 65 - 99 mg/dL   Comment 1 Arterial Specimen     Radiology/Results: Dg Chest Port 1 View  Result Date: 06/14/2017 CLINICAL DATA:  Respiratory failure. EXAM: PORTABLE CHEST 1 VIEW COMPARISON:  Chest x-ray from yesterday. FINDINGS: Endotracheal tube, bilateral internal jugular central venous catheters, and NG tube are stable in position. Unchanged cardiomegaly with diffuse perihilar and lower lobe predominant opacities and layering bilateral pleural effusions, similar to prior study. No pneumothorax.  IMPRESSION: Stable pulmonary edema and bilateral pleural effusions. Electronically Signed   By: Titus Dubin M.D.   On: 06/14/2017 09:10   Dg Chest Port 1 View  Result Date: 06/13/2017 CLINICAL DATA:  Shortness of breath. EXAM: PORTABLE CHEST 1 VIEW COMPARISON:  06/10/2017 . FINDINGS: Endotracheal tube, bilateral IJ lines, NG tube in stable position. Cardiomegaly with diffuse bilateral pulmonary infiltrates and bilateral pleural effusions again noted. Interim progression from prior exam. No pneumothorax . IMPRESSION: 1. Lines and tubes in stable position. 2. Cardiomegaly with diffuse bilateral pulmonary infiltrates/ edema and bilateral pleural effusions again noted. Interim progression from prior exam. Electronically Signed   By: [place]   On: 06/13/2017 06:39    Anti-infectives: Anti-infectives    Start     Dose/Rate Route Frequency Ordered Stop   06/11/17 1515  DAPTOmycin (CUBICIN) 1,000 mg in sodium chloride 0.9 % IVPB     1,000 mg 240 mL/hr over 30 Minutes Intravenous Every 48 hours 06/10/17 0734     06/10/17 1500  piperacillin-tazobactam (ZOSYN) IVPB 3.375 g     3.375 g 100 mL/hr over 30 Minutes Intravenous Every 6 hours 06/10/17 1017     06/10/17 1200  piperacillin-tazobactam (ZOSYN) IVPB 3.375 g  Status:  Discontinued     3.375 g 12.5 mL/hr over 240 Minutes Intravenous Every 6 hours 06/10/17 0718 06/10/17 0719   06/10/17 1200  piperacillin-tazobactam (ZOSYN) IVPB 3.375 g  Status:  Discontinued     3.375 g 100 mL/hr over 30 Minutes Intravenous Every 6 hours 06/10/17 0720 06/10/17 1017   06/09/17 2300  piperacillin-tazobactam (ZOSYN) IVPB 2.25 g  Status:  Discontinued     2.25 g 100 mL/hr over 30 Minutes Intravenous Every 8 hours 06/09/17 1729 06/10/17 0714   06/09/17 2200  piperacillin-tazobactam (ZOSYN) IVPB 3.375 g  Status:  Discontinued     3.375 g 12.5 mL/hr over 240 Minutes Intravenous Every 8 hours 06/09/17 1515 06/09/17 1634  06/09/17 1645   piperacillin-tazobactam (ZOSYN) IVPB 2.25 g  Status:  Discontinued     2.25 g 100 mL/hr over 30 Minutes Intravenous Every 8 hours 06/09/17 1634 06/09/17 1729   06/09/17 1515  DAPTOmycin (CUBICIN) 750 mg in sodium chloride 0.9 % IVPB  Status:  Discontinued     750 mg 230 mL/hr over 30 Minutes Intravenous Every 48 hours 06/09/17 1500 06/10/17 0734   06/07/2017 0930  ceFAZolin (ANCEF) IVPB 2g/100 mL premix     2 g 200 mL/hr over 30 Minutes Intravenous On call to O.R. 06/03/17 1214 06/03/2017 1006   06/02/17 2200  linezolid (ZYVOX) IVPB 600 mg  Status:  Discontinued    Comments:  Pharmacy may adjust dose prn   600 mg 300 mL/hr over 60 Minutes Intravenous Every 12 hours 06/02/17 1546 06/08/17 0915   06/02/17 1545  linezolid (ZYVOX) IVPB 600 mg  Status:  Discontinued     600 mg 300 mL/hr over 60 Minutes Intravenous Every 12 hours 06/02/17 1543 06/02/17 1546   05/31/17 1500  vancomycin (VANCOCIN) 1,250 mg in sodium chloride 0.9 % 250 mL IVPB  Status:  Discontinued     1,250 mg 166.7 mL/hr over 90 Minutes Intravenous Every 24 hours 06/05/2017 1409 06/03/17 0845   05/25/2017 2000  piperacillin-tazobactam (ZOSYN) IVPB 3.375 g  Status:  Discontinued     3.375 g 12.5 mL/hr over 240 Minutes Intravenous Every 8 hours 05/26/2017 1409 06/09/17 1515   06/13/2017 1300  vancomycin (VANCOCIN) 2,000 mg in sodium chloride 0.9 % 500 mL IVPB     2,000 mg 250 mL/hr over 120 Minutes Intravenous  Once 06/07/2017 1208 06/07/2017 1657   05/20/2017 1300  piperacillin-tazobactam (ZOSYN) IVPB 3.375 g     3.375 g 100 mL/hr over 30 Minutes Intravenous  Once 06/05/2017 1208 06/15/2017 1334   05/20/2017 0745  ceFAZolin (ANCEF) IVPB 2g/100 mL premix  Status:  Discontinued     2 g 200 mL/hr over 30 Minutes Intravenous On call to O.R. 06/12/2017 0736 06/18/2017 1143   05/20/2017 1800  ceFAZolin (ANCEF) IVPB 2g/100 mL premix  Status:  Discontinued     2 g 200 mL/hr over 30 Minutes Intravenous Every 8 hours 06/10/2017 1220 06/15/2017 1210   06/11/2017 0945   ceFAZolin (ANCEF) IVPB 2g/100 mL premix     2 g 200 mL/hr over 30 Minutes Intravenous To Surgery 05/27/17 2001 06/01/2017 1015   06/11/2017 2000  ceFAZolin (ANCEF) IVPB 2g/100 mL premix     2 g 200 mL/hr over 30 Minutes Intravenous Every 6 hours 06/13/2017 1643 05/24/17 1009   06/07/2017 1015  ceFAZolin (ANCEF) IVPB 2g/100 mL premix     2 g 200 mL/hr over 30 Minutes Intravenous On call to O.R. 06/14/2017 1014 05/27/2017 1400      Assessment/Plan: Problem List: Patient Active Problem List   Diagnosis Date Noted  . AKI (acute kidney injury) ([place])   . UGIB (upper gastrointestinal bleed)   . Hemorrhagic shock ([place])   . Acute hypoxemic respiratory failure ([place])   . Pressure injury of skin 05/27/2017  . Necrotizing fasciitis of lower leg ([place])   . Traumatic hematoma of left lower leg   . Atherosclerosis of native arteries of extremities with gangrene, left leg ([place])   . Anemia   . Elevated troponin 06/03/2017  . Hypotension   . Acute blood loss anemia 05/11/2017  . CKD (chronic kidney disease), stage III ([place]) 05/10/2017  . Thigh hematoma, left, initial encounter 05/10/2017  . Leg hematoma, left, initial  encounter 05/10/2017  . Chronic respiratory failure ([place]) 07/22/2016  . Chronic rhinitis 02/08/2016  . Acute on chronic diastolic CHF (congestive heart failure) ([place]) 12/28/2015  . PAH (pulmonary artery hypertension) ([place])   . CHF (congestive heart failure) ([place]) 11/17/2015  . Acute congestive heart failure ([place]) 11/17/2015  . Pulmonary nodules 10/07/2013  . Encounter for therapeutic drug monitoring 09/22/2013  . DOE (dyspnea on exertion) 05/26/2012  . Psoriasis   . Carotid bruit   . Warfarin anticoagulation   . CAD (coronary artery disease)   . Pulmonary hypertension ([place]) 04/19/2011  . Atrial fibrillation ([place]) 11/16/2010  . GERD 04/17/2010  . COLONIC POLYPS, HX OF 04/17/2010  . Hyperlipidemia 04/17/2009  . Overweight(278.02) 04/17/2009  . Essential hypertension 04/17/2009     Post duodenal bleed on Eloquis with renal failure (hemorrhagic shock) and respiratory failure 10 Days Post-Op    LOS: 26 days   Matt B. Hassell Done, MD, [facility] [phone] beeper [phone]  06/14/2017 11:52 AM

## 2017-06-14 NOTE — Progress Notes (Signed)
Subjective: Interval History: has no complaint , awake and on vent.  Objective: Vital signs in last 24 hours: Temp:  [96.2 F (35.7 C)-98.2 F (36.8 C)] 96.9 F (36.1 C) (10/27 0400) Pulse Rate:  [48-72] 61 (10/27 0600) Resp:  [19-31] 20 (10/27 0600) BP: (85-133)/(52-116) 125/108 (10/27 0600) SpO2:  [85 %-100 %] 85 % (10/27 0600) Arterial Line BP: (87-127)/(43-67) 111/51 (10/27 0600) FiO2 (%):  [40 %] 40 % (10/27 0558) Weight:  [105.1 kg (231 lb 11.3 oz)] 105.1 kg (231 lb 11.3 oz) (10/27 0400) Weight change: -6.5 kg (-14 lb 5.3 oz)  Intake/Output from previous day: 10/26 0701 - 10/27 0700 In: 3472.7 [I.V.:2712.4; NG/GT:150; IV Piggyback:610.3] Out: 5336 [Emesis/NG output:350; Drains:430] Intake/Output this shift: No intake/output data recorded.  General appearance: cooperative, no distress, moderately obese, pale and awake on vent Neck: cooperative, no distress, moderately obese, pale and LIJ cath Resp: diminished breath sounds bibasilar and rales bibasilar Cardio: S1, S2 normal, systolic murmur: systolic ejection 2/6, decrescendo at 2nd left intercostal space and prematures, decreased HS GI: obese, no bs, vac midline, drain RUQ Extremities: edema 3+  Lab Results:  Recent Labs  06/13/17 0438  06/14/17 0312 06/14/17 0616 06/14/17 0620  WBC 9.1  --  10.5  --   --   HGB 11.8*  < > 11.6* 11.2* 12.9  HCT 36.7  < > 35.6* 33.0* 38.0  PLT 23*  --  24*  --   --   < > = values in this interval not displayed. BMET:  Recent Labs  06/13/17 1614  06/14/17 0312 06/14/17 0616 06/14/17 0620  NA 136  < > 136 139 141  K 3.3*  < > 3.3* 3.4* 3.5  CL 97*  --  96*  --   --   CO2 31  --  32  --   --   GLUCOSE 114*  --  113*  --   --   BUN 10  --  9  --   --   CREATININE 0.64  --  0.50  --   --   CALCIUM 10.0  --  9.7  --   --   < > = values in this interval not displayed. No results for input(s): PTH in the last 72 hours. Iron Studies: No results for input(s): IRON, TIBC,  TRANSFERRIN, FERRITIN in the last 72 hours.  Studies/Results: Dg Chest Port 1 View  Result Date: 06/13/2017 CLINICAL DATA:  Shortness of breath. EXAM: PORTABLE CHEST 1 VIEW COMPARISON:  06/10/2017 . FINDINGS: Endotracheal tube, bilateral IJ lines, NG tube in stable position. Cardiomegaly with diffuse bilateral pulmonary infiltrates and bilateral pleural effusions again noted. Interim progression from prior exam. No pneumothorax . IMPRESSION: 1. Lines and tubes in stable position. 2. Cardiomegaly with diffuse bilateral pulmonary infiltrates/ edema and bilateral pleural effusions again noted. Interim progression from prior exam. Electronically Signed   By: Marcello Moores  Register   On: 06/13/2017 06:39    I have reviewed the patient's current medications.  Assessment/Plan: 1 AKI oliguric ATN.  Good solute/acid/base/k control.  Low Phos with CRRT and refeeding.  Slowly lower vol tolerating, vent status better 2 Vol xs 3 Pulm HTN slowly lower vol 4 Anemia stable 5 low Platelets 6 GIB s/p Lap no bowel activity , per Gen Surg 7 VDRF weaning slow 8 Nutrition on TF 9 Fasciitis on Dapto/Zosyn, needs Amp 10 obesity P HD, lower vol , AB, vent, TNA, Phos suppl    LOS: 26 days   Daelon Dunivan L  06/14/2017,7:46 AM

## 2017-06-14 NOTE — Progress Notes (Signed)
PULMONARY / CRITICAL CARE MEDICINE   Name: [name] MRN: [id number] DOB: 1934/09/21    ADMISSION DATE:  06/02/2017 CONSULTATION DATE:  06/11/2017  REFERRING MD:  Dr. Sherral Hammers  CHIEF COMPLAINT:  Acute hypotension  brief 82 year old female with history of HFpEF, necrotizing fasciitis of left lower leg, HLD, tricuspid regurg, pulmonary hypertension, CKD III, COPD on 2.5L at night presents with hypotension requiring pressor support after repeat left leg wound debridement earlier today (10/17). Patient was in her usual state of health until 9/22 when she presented for traumatic left leg hematoma and hypovolemic shock. She was admitted to the hospital 05/10/17-05/16/2017 and subsequently discharged to SNF where she experienced worsening weakness. She was then readmitted with acute on chronic HFpEF exacerbation on 10/1.  During this hospital stay patient continued to have hemodynamic instability requiring 8 units PRBC over her hospital course. She has been taken back to the OR  On 10/5, 10/10, 10/12, and 10/17 for further wound debridement. Today, after her procedure was completed and wound vac in place, she was in PACU where her blood pressure was noted to drop to 02R systolic. She was given albumin. Neo was initiated. Patient indicated she was dizzy and lightheaded. 3u PRBC were administered. PCCM was called for consult.  Of note patient had grossly bloody stool in the PACU with her hypotensive episode. Bowel movement was described as formed but grossly bloody/in a pool of blood. She denies prior episodes of melena or hematochezia. She endorses being constipated over past few days.   STUDIES:  Renal US 10/22 > no lesions or obstructing stone.  CULTURES: Surgical wound culture 10/10 >> E coli, Pseudomonas, Morganella morganii, proteus, VRE  ANTIBIOTICS: 10/10 Ancef >> 10/12 10/15 Linezolid >> 10/21 10/12 Zosyn >> Daptomycin 10/22 >>  LINES/TUBES: PIV x3 Foley catheter Negative pressure  wound L leg Closed system drain RLQ bulb CVC triple lumen NG/OG   SIGNIFICANT EVENTS: 10/5 Left thigh and left leg debridement 10/10 Repeat irrigation and debridement left thigh and left leg 10/12 Debridement left leg and thigh 10/17 Debridement left thigh wound 10/17 EGD, exploratory laparotomy with bleeding duodenal ulcer/perforated duodenum. Intubated, maxed on 4 pressors in ICU. 10/23- CVVHD 10/26-  A line placed yesterday. Renal recommending fluid off. Ortho planning for AKA. No acute events overnight.   SUBJECTIVE/OVERNIGHT/INTERVAL HX 10/27 - palliative care meeting with family. On TPN. Oliguric ATN. Has low phos with refeeding. On levophed and needs increasing. wEaning 10/5 on SBT but very deconditioned and frail.    VITAL SIGNS: BP 129/75   Pulse (!) 56   Temp (!) 96.8 F (36 C) (Axillary)   Resp (!) 22   Ht 5' 2.99" (1.6 m)   Wt 105.1 kg (231 lb 11.3 oz)   SpO2 100%   BMI 41.05 kg/m   HEMODYNAMICS: CVP:  [5 mmHg-7 mmHg] 7 mmHg  VENTILATOR SETTINGS: Vent Mode: CPAP;PSV FiO2 (%):  [40 %] 40 % Set Rate:  [15 bmp] 15 bmp Vt Set:  [330 mL] 330 mL PEEP:  [5 cmH20] 5 cmH20 Pressure Support:  [10 cmH20] 10 cmH20 Plateau Pressure:  [15 cmH20-16 cmH20] 15 cmH20  INTAKE / OUTPUT: I/O last 3 completed shifts: In: 4966.6 [I.V.:4206.3; NG/GT:150; IV Piggyback:610.3] Out: 7636 [Emesis/NG output:540; Drains:490; Other:6606]  PHYSICAL EXAMINATION:  General Appearance:    Looks criticall ill OBESE - yes  Head:    Normocephalic, without obvious abnormality, atraumatic  Eyes:    PERRL - yes, conjunctiva/corneas - clear      Ears:  Normal external ear canals, both ears  Nose:   NG tube - YES  Throat:  ETT TUBE - yes , OG tube - no  Neck:   Supple,  No enlargement/tenderness/nodules     Lungs:     Clear to auscultation bilaterally, Ventilator   Synchrony - yes  Chest wall:    No deformity  Heart:    S1 and S2 normal, no murmur, CVP - no.  Pressors - levophed 65mcg  quad strength  Abdomen:     Soft, no masses, no organomegaly  Genitalia:    Not done  Rectal:   not done  Extremities:   Extremities- LLE ACE WRAP +     Skin:   Intact in exposed areas . Sacral area - no report of sacral decub per RN     Neurologic:   Sedation - prn -> RASS - 0 . Moves all 4s - slowly. CAM-ICU - neg for delirium . Orientation - follows commands      LABS:  PULMONARY  Recent Labs Lab 06/08/17 0450  06/13/17 1406  06/13/17 1834 06/13/17 2228 06/13/17 2233 06/14/17 0616 06/14/17 0620  PHART 7.349*  --  7.364  --  7.337* 7.337*  --  7.340*  --   PCO2ART 47.7  --  58.7*  --  61.4* 61.1*  --  63.8*  --   PO2ART 92.1  --  120.0*  --  126.0* 128.0*  --  143.0*  --   HCO3 25.6  < > 33.6*  < > 33.0* 33.1* 33.4* 34.8* 33.0*  TCO2  --   < > 35*  < > 35* 35* 36* 37* 35*  O2SAT 97.0  < > 98.0  < > 99.0 99.0 46.0 99.0 45.0  < > = values in this interval not displayed.  CBC  Recent Labs Lab 06/12/17 0823  06/13/17 0438  06/14/17 0312 06/14/17 0616 06/14/17 0620  HGB 12.6  < > 11.8*  < > 11.6* 11.2* 12.9  HCT 38.3  < > 36.7  < > 35.6* 33.0* 38.0  WBC 8.6  --  9.1  --  10.5  --   --   PLT 18*  --  23*  --  24*  --   --   < > = values in this interval not displayed.  COAGULATION  Recent Labs Lab 06/08/17 0830 06/09/17 0500  INR 1.45 1.47    CARDIAC  No results for input(s): TROPONINI in the last 168 hours. No results for input(s): PROBNP in the last 168 hours.   CHEMISTRY  Recent Labs Lab 06/10/17 0555  06/11/17 0431  06/12/17 0423  06/12/17 1529  06/13/17 0433 06/13/17 0347 06/13/17 0440  06/13/17 1614  06/13/17 2228 06/13/17 2233 06/14/17 0312 06/14/17 0616 06/14/17 0620  NA 142  < > 139  < > 138  < > 134*  < > 140 136 137  < > 136  < > 138 140 136 139 141  K 3.8  < > 3.1*  < > 3.6  < > 3.5  < > 3.6 3.4* 3.5  < > 3.3*  < > 3.4* 3.5 3.3* 3.4* 3.5  CL 107  < > 99*  < > 96*  < > 96*  < > 91* 96* 93*  --  97*  --   --   --  96*  --   --    CO2 22  < > 28  < > 32  --  31  --   --  33*  --   --  31  --   --   --  32  --   --   GLUCOSE 109*  < > 231*  < > 131*  < > 141*  < > 170* 127* 129*  --  114*  --   --   --  113*  --   --   BUN 63*  < > 36*  < > 15  < > 13  < > 8 13 12   --  10  --   --   --  9  --   --   CREATININE 3.22*  < > 1.79*  < > 0.83  < > 0.82  < > 0.40* 0.71 0.60  --  0.64  --   --   --  0.50  --   --   CALCIUM 8.0*  < > 8.8*  < > 9.2  --  9.8  --   --  10.2  --   --  10.0  --   --   --  9.7  --   --   MG 1.8  --  1.8  --  2.0  --   --   --   --  1.8  --   --   --   --   --   --  1.7  --   --   PHOS 5.1*  < > 2.3*  < > 1.2*  --  2.1*  --   --  1.6*  --   --  2.1*  --   --   --  1.8*  --   --   < > = values in this interval not displayed. Estimated Creatinine Clearance: 65.1 mL/min (by C-G formula based on SCr of 0.5 mg/dL).   LIVER  Recent Labs Lab 06/08/17 0830  06/09/17 0500 06/10/17 0555  06/12/17 0423 06/12/17 1529 06/13/17 0438 06/13/17 1614 06/14/17 0312  AST  --   --  121* 49*  --  27  --   --   --   --   ALT  --   --  19 11*  --  9*  --   --   --   --   ALKPHOS  --   --  130* 139*  --  138*  --   --   --   --   BILITOT  --   --  1.6* 1.6*  --  1.4*  --   --   --   --   PROT  --   --  4.2* 4.2*  --  4.7*  --   --   --   --   ALBUMIN  --   < > 1.4* 1.4*  < > 1.3* 1.2* 1.2* 1.2* 1.2*  INR 1.45  --  1.47  --   --   --   --   --   --   --   < > = values in this interval not displayed.   INFECTIOUS No results for input(s): LATICACIDVEN, PROCALCITON in the last 168 hours.   ENDOCRINE CBG (last 3)   Recent Labs  06/14/17 0001 06/14/17 0412 06/14/17 0830  GLUCAP 126* 107* 113*         IMAGING x48h  - image(s) personally visualized  -   highlighted in bold Dg Chest Port 1 View  Result Date: 06/14/2017 CLINICAL DATA:  Respiratory failure. EXAM: PORTABLE CHEST 1 VIEW COMPARISON:  Chest x-ray from yesterday. FINDINGS: Endotracheal tube, bilateral internal jugular central venous  catheters, and NG tube are stable in position. Unchanged cardiomegaly with diffuse perihilar and lower lobe predominant opacities and layering bilateral pleural effusions, similar to prior study. No pneumothorax. IMPRESSION: Stable pulmonary edema and bilateral pleural effusions. Electronically Signed   By: Titus Dubin M.D.   On: 06/14/2017 09:10   Dg Chest Port 1 View  Result Date: 06/13/2017 CLINICAL DATA:  Shortness of breath. EXAM: PORTABLE CHEST 1 VIEW COMPARISON:  06/10/2017 . FINDINGS: Endotracheal tube, bilateral IJ lines, NG tube in stable position. Cardiomegaly with diffuse bilateral pulmonary infiltrates and bilateral pleural effusions again noted. Interim progression from prior exam. No pneumothorax . IMPRESSION: 1. Lines and tubes in stable position. 2. Cardiomegaly with diffuse bilateral pulmonary infiltrates/ edema and bilateral pleural effusions again noted. Interim progression from prior exam. Electronically Signed   By: Marcello Moores  Register   On: 06/13/2017 06:39      DISCUSSION: 82 year old female with history of HFpEF, necrotizing fasciitis of left lower leg, HLD, tricuspid regurg, pulmonary hypertension, CKD III, COPD on 2L at night, presents after repeat left lower extremity debridement with acute hypotension. Subsequently found to have duodenal ulcer with perforation, in hemorrhagic shock. Maxed on 4 pressors in ICU, intubated.   ASSESSMENT / PLAN:  GASTROINTESTINAL A:   Acute GI bleed 2/2 duodenal ulcer with hemorrhagic shock - s/p ex lap with repair 10/17 Shock liver; ALT 389, AST 2448 Nutrition   - NPO on TTPN  P:   UGI series once able per surgery PPI  Follow coags, CBC Continue TNA per pharm  PULMONARY A: Pulmonary Hypertension - RHC 11/2015 with PAH 59/17 mmHg Mechanically ventilated - acute resop failure  06/14/2017 - > does NOT  meet criteria for Extubation in setting of Acute Respiratory Failure due to  MODS and deconditioning and volume overload th  ough doing SBT  P:   Holding home lasix, letaris Continue vent support; SBT daily, goal 5/5. Tolerating SBT well. No extubation 06/14/2017 Extubation timing after goals of care Bronchial hygiene CXR intermittently  CARDIOVASCULAR A:  HFpEF (Last echo 05/21/2017 indicates EF 60-65%, moderate MR, mod pulm HTN) with intermittent episodes of hypotension. Hypovolemic shock requiring massive blood transfusion Paroxysmal Atrial fibrillation CAD Elevated troponin   06/14/2017 - ongoing circulatory shock  P:  Levophed at 13 -  No anticoagulation due to GIB Follow CVP Monitor intake/output  RENAL A:   AKI on CKD stage III (baseline Cr 0.8-1.1), worsening.  Continues to have anuria Hypokalemia Hypocalcemia   - ON CRRT. Low phos an dmag with refeeding P:   Continue CRRT per nephrology Monitor electrolytes Follow BMP q12hrs given anuria  HEMATOLOGIC  Recent Labs Lab 06/12/17 0823  06/13/17 0438  06/14/17 0312 06/14/17 0616 06/14/17 0620  HGB 12.6  < > 11.8*  < > 11.6* 11.2* 12.9  HCT 38.3  < > 36.7  < > 35.6* 33.0* 38.0  WBC 8.6  --  9.1  --  10.5  --   --   PLT 18*  --  23*  --  24*  --   --   < > = values in this interval not displayed.  A:   Hemorrhagic shock requiring massive blood transfusions  Acute blood loss anemia - due to above - improved Thrombocytopenia - chronic low in hthe hospital and same   P:  Transfuse for Hgb < 8 due to cardiac arrest Monitor CBC  INFECTIOUS A:  Surgical wound culture 10/10 >> E coli, Pseudomonas, Morganella morganii, proteus, VRE Massive wound necrosis of LLE, will require AKA P:   S/p linezolid >> now on zosyn and daptomycin Follow wound cultures Ortho planning for left AKA once stabilized   ENDOCRINE A:   Hypoglycemia - resolved P:  Monitor glucose on BMP, CBG  NEUROLOGIC A:   Remains neurologically intact, no acute process  P:   Monitor mental status    FAMILY  - Inter-disciplinary family meet or  Palliative Care meeting due by:  06/11/2017. - dpne 06/14/27. Palliative meeting started 06/14/2017. Husband updated 06/14/2017    GLOBAL Hgi risk for LTAC/TRACH poor long term outcomes     The patient is critically ill with multiple organ systems failure and requires high complexity decision making for assessment and support, frequent evaluation and titration of therapies, application of advanced monitoring technologies and extensive interpretation of multiple databases.   Critical Care Time devoted to patient care services described in this note is  30  Minutes. This time reflects time of care of this signee Dr Brand Males. This critical care time does not reflect procedure time, or teaching time or supervisory time of PA/NP/Med student/Med Resident etc but could involve care discussion time    Dr. Brand Males, M.D., [facility].C.P Pulmonary and Critical Care Medicine Staff Physician [place] [facility] Pager: [phone], If no answer or between  15:00h - 7:00h: call [phone]  06/14/2017 10:28 AM

## 2017-06-14 NOTE — Progress Notes (Signed)
PHARMACY - ADULT TOTAL PARENTERAL NUTRITION CONSULT NOTE   Pharmacy Consult:  TPN Indication:  Prolonged NPO status  Patient Measurements: Height: 5' 2.99" (160 cm) Weight: 231 lb 11.3 oz (105.1 kg) IBW/kg (Calculated) : 52.38 TPN AdjBW (KG): 64 Body mass index is 41.05 kg/m.  Assessment:  82 year old female presented on 05/27/2017 with CHF exacerbation, hypotension and elevated troponins.  PMH significant for AFib, dHF, CKD3, GERD, HLD, CAD, and pulmonary HTN.  Patient has gangrenous left thigh/leg and underwent multiple I&Ds.  She required transfer to the ICU after the I&D on 06/12/2017 d/t hypotension from duodenal ulcer bleeding.  Patient underwent ex-lap with pyloroplasty and oversewing of the bleeding ulcer on the same day.  Per charting, patient was on a carb modified diet through 06/03/17.  She has been NPO x6 days with anticipated prolonged NPO status; therefore, pharmacy was consulted to manage TPN.  GI: GIB on IV PPI BID, drains O/P 430 mL (increased). NGT output 350 mL. ?BL prealbumin >60.  UGI when stable. Alb 1.2. LBM 10/24. Endo: no hx DM - CBGs low-low normal prior TPN, then elevated, now controlled with less CHO in customized TPN Insulin requirements in the past 24 hours: 4 units SSI (decreased) Lytes: ?refeeding.  K+ 3.5 (stable), CL 96, increasing (max CL in TPN), Phos low but increasing at 1.8 (s/p NaPhos 30 mmol + KPhos 10 mmol on 10/26; has received daily x3 days), Mg 1.7 (decr), iCa 0.7 (Ca gluconate through CRRT).   Renal: CKD3 - CRRT (4/2.5 bags) started 10/23 >> present - SCr 0.6, BUN normalized - no UOP, 4.6L removed/24hrs, net +13.4L since admit, D5NS at 20 ml/hr.  Pulm: intubated 10/17, FiO2 40% (attempting wean) Cards: s/p code blue, MAP 60s, brady, CVP 7, in Afib - Levophed gtt @ 5 AC: Eliquis for Afib held since 9/23 d/t LLE hematoma and massive duodenal bleed - H/H 12.9/38, plts 24K (low-stablizing) Ortho: gangrenous left thigh/leg s/p I&D 10/5, 10/10, 10/12,  10/17.  Needs AKA. Hepatobil: LFTs improving, tbili down to 1.4 (no jaundice) Neuro: PRN Fentanyl - GCS 13, RASS at goal 0. Responds to questions. ID: Zosyn/Cubicin for infected leg wound.  Possible PNA on CXR - low temperatures on CRRT, WBC down to within normal limits.  Best Practices: CHG TPN Access: CVC placed 06/05/17 TPN start date: 06/09/17  Nutritional Goals (per RD rec on 10/19): 1017-5102 kCal and 130gm protein per day *Using weight 114 kg  Current Nutrition:  TPN  Plan:  -Increase Customized TPN (AA15%/CHO70%: 98/16) to 45 ml/hr (goal rate ~55 ml/hr), no electrolytes except NaPhos per Renal's request.  Today's TPN will provide an additional 22 mmol NaPhos per day. Maximizing Chloride in TPN.  Will slowly advance to goal watching lytes. - Hold 20% ILE for the first 7 days of TPN in ICU patients per ASPEN/SCCM guidelines (start date 06/16/17) - TPN providing 106gm of protein and 173g of CHO = 1011 kCal, meeting ~80% of total needs. - Daily multivitamin and trace elements in TPN  - Continue moderate SSI Q4H (anticipate increasing TPN to goal rate in AM, pending lytes) - F/U daily  Sloan Leiter, PharmD, BCPS, [place] Clinical Pharmacist Clinical phone 06/14/2017 until 3:30PM - #[phone] After hours, please call [phone] 06/14/2017, 7:21 AM

## 2017-06-15 ENCOUNTER — Inpatient Hospital Stay (HOSPITAL_COMMUNITY): Payer: Medicare Other

## 2017-06-15 LAB — POCT I-STAT 7, (LYTES, BLD GAS, ICA,H+H)
ACID-BASE EXCESS: 12 mmol/L — AB (ref 0.0–2.0)
Acid-Base Excess: 7 mmol/L — ABNORMAL HIGH (ref 0.0–2.0)
Acid-Base Excess: 9 mmol/L — ABNORMAL HIGH (ref 0.0–2.0)
BICARBONATE: 36.7 mmol/L — AB (ref 20.0–28.0)
Bicarbonate: 34.8 mmol/L — ABNORMAL HIGH (ref 20.0–28.0)
Bicarbonate: 40.2 mmol/L — ABNORMAL HIGH (ref 20.0–28.0)
CALCIUM ION: 1.23 mmol/L (ref 1.15–1.40)
Calcium, Ion: 1.24 mmol/L (ref 1.15–1.40)
Calcium, Ion: 1.23 mmol/L (ref 1.15–1.40)
HCT: 33 % — ABNORMAL LOW (ref 36.0–46.0)
HCT: 33 % — ABNORMAL LOW (ref 36.0–46.0)
HEMATOCRIT: 32 % — AB (ref 36.0–46.0)
HEMOGLOBIN: 10.9 g/dL — AB (ref 12.0–15.0)
Hemoglobin: 11.2 g/dL — ABNORMAL LOW (ref 12.0–15.0)
Hemoglobin: 11.2 g/dL — ABNORMAL LOW (ref 12.0–15.0)
O2 SAT: 99 %
O2 Saturation: 99 %
O2 Saturation: 98 %
PCO2 ART: 64.2 mmHg — AB (ref 32.0–48.0)
PCO2 ART: 73.7 mmHg — AB (ref 32.0–48.0)
PCO2 ART: 62.9 mmHg — AB (ref 32.0–48.0)
PH ART: 7.337 — AB (ref 7.350–7.450)
PO2 ART: 109 mmHg — AB (ref 83.0–108.0)
POTASSIUM: 3.4 mmol/L — AB (ref 3.5–5.1)
Patient temperature: 95.9
Potassium: 4 mmol/L (ref 3.5–5.1)
Potassium: 3.5 mmol/L (ref 3.5–5.1)
SODIUM: 138 mmol/L (ref 135–145)
Sodium: 139 mmol/L (ref 135–145)
Sodium: 138 mmol/L (ref 135–145)
TCO2: 37 mmol/L — AB (ref 22–32)
TCO2: 43 mmol/L — ABNORMAL HIGH (ref 22–32)
TCO2: 39 mmol/L — ABNORMAL HIGH (ref 22–32)
pH, Arterial: 7.34 — ABNORMAL LOW (ref 7.350–7.450)
pH, Arterial: 7.368 (ref 7.350–7.450)
pO2, Arterial: 139 mmHg — ABNORMAL HIGH (ref 83.0–108.0)
pO2, Arterial: 144 mmHg — ABNORMAL HIGH (ref 83.0–108.0)

## 2017-06-15 LAB — CBC
HEMATOCRIT: 34.6 % — AB (ref 36.0–46.0)
Hemoglobin: 11.3 g/dL — ABNORMAL LOW (ref 12.0–15.0)
MCH: 29.7 pg (ref 26.0–34.0)
MCHC: 32.7 g/dL (ref 30.0–36.0)
MCV: 90.8 fL (ref 78.0–100.0)
PLATELETS: 29 10*3/uL — AB (ref 150–400)
RBC: 3.81 MIL/uL — AB (ref 3.87–5.11)
RDW: 16.6 % — ABNORMAL HIGH (ref 11.5–15.5)
WBC: 11.7 10*3/uL — AB (ref 4.0–10.5)

## 2017-06-15 LAB — RENAL FUNCTION PANEL
ALBUMIN: 1.1 g/dL — AB (ref 3.5–5.0)
ANION GAP: 6 (ref 5–15)
Albumin: 1.1 g/dL — ABNORMAL LOW (ref 3.5–5.0)
Anion gap: 8 (ref 5–15)
BUN: 8 mg/dL (ref 6–20)
BUN: 11 mg/dL (ref 6–20)
CHLORIDE: 96 mmol/L — AB (ref 101–111)
CHLORIDE: 93 mmol/L — AB (ref 101–111)
CO2: 34 mmol/L — ABNORMAL HIGH (ref 22–32)
CO2: 33 mmol/L — ABNORMAL HIGH (ref 22–32)
CREATININE: 0.49 mg/dL (ref 0.44–1.00)
CREATININE: 0.55 mg/dL (ref 0.44–1.00)
Calcium: 9.4 mg/dL (ref 8.9–10.3)
Calcium: 9.4 mg/dL (ref 8.9–10.3)
GFR calc Af Amer: >60 mL/min (ref >60)
GLUCOSE: 115 mg/dL — AB (ref 65–99)
Glucose, Bld: 133 mg/dL — ABNORMAL HIGH (ref 65–99)
PHOSPHORUS: 2.5 mg/dL (ref 2.5–4.6)
POTASSIUM: 3.4 mmol/L — AB (ref 3.5–5.1)
Phosphorus: 1.3 mg/dL — ABNORMAL LOW (ref 2.5–4.6)
Potassium: 3.9 mmol/L (ref 3.5–5.1)
Sodium: 136 mmol/L (ref 135–145)
Sodium: 134 mmol/L — ABNORMAL LOW (ref 135–145)

## 2017-06-15 LAB — GLUCOSE, CAPILLARY
GLUCOSE-CAPILLARY: 111 mg/dL — AB (ref 65–99)
GLUCOSE-CAPILLARY: 84 mg/dL (ref 65–99)
GLUCOSE-CAPILLARY: 113 mg/dL — AB (ref 65–99)
Glucose-Capillary: 113 mg/dL — ABNORMAL HIGH (ref 65–99)
Glucose-Capillary: 118 mg/dL — ABNORMAL HIGH (ref 65–99)
Glucose-Capillary: 120 mg/dL — ABNORMAL HIGH (ref 65–99)

## 2017-06-15 LAB — POCT I-STAT EG7
ACID-BASE EXCESS: 7 mmol/L — AB (ref 0.0–2.0)
Acid-Base Excess: 6 mmol/L — ABNORMAL HIGH (ref 0.0–2.0)
Bicarbonate: 35.7 mmol/L — ABNORMAL HIGH (ref 20.0–28.0)
Bicarbonate: 36.2 mmol/L — ABNORMAL HIGH (ref 20.0–28.0)
CALCIUM ION: 0.67 mmol/L — AB (ref 1.15–1.40)
CALCIUM ION: 0.67 mmol/L — AB (ref 1.15–1.40)
HCT: 37 % (ref 36.0–46.0)
HEMATOCRIT: 35 % — AB (ref 36.0–46.0)
Hemoglobin: 12.6 g/dL (ref 12.0–15.0)
Hemoglobin: 11.9 g/dL — ABNORMAL LOW (ref 12.0–15.0)
O2 SAT: 37 %
O2 Saturation: 61 %
PCO2 VEN: 75.4 mmHg — AB (ref 44.0–60.0)
PH VEN: 7.283 (ref 7.250–7.430)
POTASSIUM: 3.7 mmol/L (ref 3.5–5.1)
POTASSIUM: 3.5 mmol/L (ref 3.5–5.1)
Patient temperature: 97
Sodium: 140 mmol/L (ref 135–145)
Sodium: 140 mmol/L (ref 135–145)
TCO2: 38 mmol/L — AB (ref 22–32)
TCO2: 39 mmol/L — AB (ref 22–32)
pCO2, Ven: 75.3 mmHg (ref 44.0–60.0)
pH, Ven: 7.279 (ref 7.250–7.430)
pO2, Ven: 36 mmHg (ref 32.0–45.0)
pO2, Ven: 24 mmHg — CL (ref 32.0–45.0)

## 2017-06-15 LAB — MAGNESIUM: MAGNESIUM: 1.9 mg/dL (ref 1.7–2.4)

## 2017-06-15 MED ORDER — POTASSIUM CHLORIDE 10 MEQ/100ML IV SOLN
10.0000 meq | INTRAVENOUS | Status: AC
  Administered 2017-06-15 (×4): 10 meq via INTRAVENOUS
  Filled 2017-06-15 (×4): qty 100

## 2017-06-15 MED ORDER — TRACE MINERALS CR-CU-MN-SE-ZN 10-1000-500-60 MCG/ML IV SOLN
INTRAVENOUS | Status: AC
  Administered 2017-06-15: 18:00:00 via INTRAVENOUS
  Filled 2017-06-15: qty 705.6

## 2017-06-15 MED ORDER — SODIUM PHOSPHATES 45 MMOLE/15ML IV SOLN
30.0000 mmol | Freq: Once | INTRAVENOUS | Status: AC
  Administered 2017-06-15: 30 mmol via INTRAVENOUS
  Filled 2017-06-15: qty 10

## 2017-06-15 NOTE — Progress Notes (Signed)
PULMONARY / CRITICAL CARE MEDICINE   Name: [name] MRN: [id number] DOB: July 07, 1935    ADMISSION DATE:  06/08/2017 CONSULTATION DATE:  06/10/2017  REFERRING MD:  Dr. Sherral Hammers  CHIEF COMPLAINT:  Acute hypotension  brief 82 year old female with history of HFpEF, necrotizing fasciitis of left lower leg, HLD, tricuspid regurg, pulmonary hypertension, CKD III, COPD on 2.5L at night presents with hypotension requiring pressor support after repeat left leg wound debridement earlier today (10/17). Patient was in her usual state of health until 9/22 when she presented for traumatic left leg hematoma and hypovolemic shock. She was admitted to the hospital 05/10/17-05/16/2017 and subsequently discharged to SNF where she experienced worsening weakness. She was then readmitted with acute on chronic HFpEF exacerbation on 10/1.  During this hospital stay patient continued to have hemodynamic instability requiring 8 units PRBC over her hospital course. She has been taken back to the OR  On 10/5, 10/10, 10/12, and 10/17 for further wound debridement. Today, after her procedure was completed and wound vac in place, she was in PACU where her blood pressure was noted to drop to 09F systolic. She was given albumin. Neo was initiated. Patient indicated she was dizzy and lightheaded. 3u PRBC were administered. PCCM was called for consult.  Of note patient had grossly bloody stool in the PACU with her hypotensive episode. Bowel movement was described as formed but grossly bloody/in a pool of blood. She denies prior episodes of melena or hematochezia. She endorses being constipated over past few days.   STUDIES:  Renal US 10/22 > no lesions or obstructing stone.  CULTURES: Surgical wound culture 10/10 >> E coli, Pseudomonas, Morganella morganii, proteus, VRE  ANTIBIOTICS: 10/10 Ancef >> 10/12 10/15 Linezolid >> 10/21 10/12 Zosyn >> Daptomycin 10/22 >>  LINES/TUBES: PIV x3 Foley catheter Negative pressure  wound L leg Closed system drain RLQ bulb CVC triple lumen NG/OG   SIGNIFICANT EVENTS: 10/5 Left thigh and left leg debridement 10/10 Repeat irrigation and debridement left thigh and left leg 10/12 Debridement left leg and thigh 10/17 Debridement left thigh wound 10/17 EGD, exploratory laparotomy with bleeding duodenal ulcer/perforated duodenum. Intubated, maxed on 4 pressors in ICU. 10/23- CVVHD 10/26-  A line placed yesterday. Renal recommending fluid off. Ortho planning for AKA. No acute events overnight. 10/27 - palliative care meeting with family. On TPN. Oliguric ATN. Has low phos with refeeding. On levophed and needs increasing. wEaning 10/5 on SBT but very deconditioned and frail.    SUBJECTIVE/OVERNIGHT/INTERVAL HX 10/28 0- full pallaitive and family meet planned for 06/16/17 or 2017-06-29. Doing SBT but progressively deconditioned. CRRT on with continued volume removal. Levophed continued   VITAL SIGNS: BP (!) 186/159   Pulse (!) 54   Temp (!) 97.5 F (36.4 C) (Oral)   Resp (!) 24   Ht 5' 2.99" (1.6 m)   Wt 107.7 kg (237 lb 7 oz)   SpO2 97%   BMI 42.07 kg/m   HEMODYNAMICS: CVP:  [6 mmHg-7 mmHg] 6 mmHg  VENTILATOR SETTINGS: Vent Mode: CPAP;PSV FiO2 (%):  [40 %] 40 % Set Rate:  [15 bmp] 15 bmp Vt Set:  [330 mL] 330 mL PEEP:  [5 cmH20] 5 cmH20 Pressure Support:  [10 cmH20] 10 cmH20 Plateau Pressure:  [10 cmH20-14 cmH20] 13 cmH20  INTAKE / OUTPUT: I/O last 3 completed shifts: In: 4572.7 [I.V.:3819.4; NG/GT:200; IV Piggyback:553.3] Out: 8182 [Emesis/NG output:270; Drains:395; Other:6202]  PHYSICAL EXAMINATION:  General Appearance:    Looks criticall ill OBESE - + and  very deconditioned looking. BAIR HUGGER ON  Head:    Normocephalic, without obvious abnormality, atraumatic  Eyes:    PERRL - yes, conjunctiva/corneas - clear      Ears:    Normal external ear canals, both ears  Nose:   NG tube - yes  Throat:  ETT TUBE - yes ,   Neck:   Supple,  No  enlargement/tenderness/nodules     Lungs:     Clear to auscultation bilaterally, Ventilator   Synchrony - yes  Chest wall:    No deformity  Heart:    S1 and S2 normal, no murmur, CVP - x.  Pressors - yes  Abdomen:     Soft, no masses, no organomegaly  Genitalia:    Not done  Rectal:   not done  Extremities:   Extremities- edema     Skin:   Intact in exposed areas .     Neurologic:   Sedation - none -> RASS - -2 . Moves all 4s - yes but very frail        LABS:  PULMONARY  Recent Labs Lab 06/14/17 0616  06/14/17 1356 06/14/17 1410 06/14/17 2046 06/14/17 2250 06/15/17 0347  PHART 7.340*  --   --  7.355 7.335* 7.344* 7.337*  PCO2ART 63.8*  --   --  58.1* 66.0* 63.0* 64.2*  PO2ART 143.0*  --   --  138.0* 120.0* 130.0* 139.0*  HCO3 34.8*  < > 32.5* 32.7* 35.5* 34.6* 34.8*  TCO2 37*  < > 34* 34* 38* 37* 37*  O2SAT 99.0  < > 60.0 99.0 98.0 99.0 99.0  < > = values in this interval not displayed.  CBC  Recent Labs Lab 06/13/17 0438  06/14/17 0312  06/14/17 2250 06/15/17 0341 06/15/17 0347  HGB 11.8*  < > 11.6*  < > 11.6* 11.3* 11.2*  HCT 36.7  < > 35.6*  < > 34.0* 34.6* 33.0*  WBC 9.1  --  10.5  --   --  11.7*  --   PLT 23*  --  24*  --   --  29*  --   < > = values in this interval not displayed.  COAGULATION  Recent Labs Lab 06/09/17 0500  INR 1.47    CARDIAC  No results for input(s): TROPONINI in the last 168 hours. No results for input(s): PROBNP in the last 168 hours.   CHEMISTRY  Recent Labs Lab 06/11/17 0431  06/12/17 0423  06/13/17 0438  06/13/17 1614  06/14/17 0312  06/14/17 1918 06/14/17 2046 06/14/17 2242 06/14/17 2250 06/15/17 0341 06/15/17 0347  NA 139  < > 138  < > 136  < > 136  < > 136  < > 139 138 135 139 136 139  K 3.1*  < > 3.6  < > 3.4*  < > 3.3*  < > 3.3*  < > 2.4* 3.5 3.7 3.6 3.4* 3.4*  CL 99*  < > 96*  < > 96*  < > 97*  --  96*  --  109  --  97*  --  96*  --   CO2 28  < > 32  < > 33*  --  31  --  32  --  25  --  32  --   34*  --   GLUCOSE 231*  < > 131*  < > 127*  < > 114*  --  113*  --  104*  --  136*  --  133*  --   BUN 36*  < > 15  < > 13  < > 10  --  9  --  6  --  7  --  8  --   CREATININE 1.79*  < > 0.83  < > 0.71  < > 0.64  --  0.50  --  0.34*  --  0.48  --  0.49  --   CALCIUM 8.8*  < > 9.2  < > 10.2  --  10.0  --  9.7  --  6.8*  --  9.3  --  9.4  --   MG 1.8  --  2.0  --  1.8  --   --   --  1.7  --   --   --   --   --  1.9  --   PHOS 2.3*  < > 1.2*  < > 1.6*  --  2.1*  --  1.8*  --  1.2*  --  1.4*  --  1.3*  --   < > = values in this interval not displayed. Estimated Creatinine Clearance: 66 mL/min (by C-G formula based on SCr of 0.49 mg/dL).   LIVER  Recent Labs Lab 06/09/17 0500 06/10/17 0555  06/12/17 0423  06/13/17 1614 06/14/17 0312 06/14/17 1918 06/14/17 2242 06/15/17 0341  AST 121* 49*  --  27  --   --   --   --   --   --   ALT 19 11*  --  9*  --   --   --   --   --   --   ALKPHOS 130* 139*  --  138*  --   --   --   --   --   --   BILITOT 1.6* 1.6*  --  1.4*  --   --   --   --   --   --   PROT 4.2* 4.2*  --  4.7*  --   --   --   --   --   --   ALBUMIN 1.4* 1.4*  < > 1.3*  < > 1.2* 1.2* <1.0* 1.2* 1.1*  INR 1.47  --   --   --   --   --   --   --   --   --   < > = values in this interval not displayed.   INFECTIOUS No results for input(s): LATICACIDVEN, PROCALCITON in the last 168 hours.   ENDOCRINE CBG (last 3)   Recent Labs  06/14/17 2346 06/15/17 0434 06/15/17 0855  GLUCAP 106* 113* 111*         IMAGING x48h  - image(s) personally visualized  -   highlighted in bold Dg Chest Port 1 View  Result Date: 06/15/2017 CLINICAL DATA:  ETT present, CKD, hypotension EXAM: PORTABLE CHEST 1 VIEW COMPARISON:  06/14/2017 FINDINGS: Endotracheal tube is in place with tip approximately cm above the carina. Left IJ central line tip overlies the level of the brachiocephalic vein -SVC confluence. Right IJ central line tip overlies the level of the superior vena cava. Stable  cardiomegaly. There are bibasilar opacities, asymmetric and increased on the right. Overall, these opacities are stable. Bilateral pleural effusions. IMPRESSION: Persistent bilateral opacities and cardiomegaly. Electronically Signed   By: Nolon Nations M.D.   On: 06/15/2017 07:56   Dg Chest Port 1 View  Result Date: 06/14/2017 CLINICAL DATA:  Respiratory failure. EXAM: PORTABLE CHEST 1 VIEW COMPARISON:  Chest x-ray from yesterday. FINDINGS: Endotracheal tube, bilateral internal jugular central venous catheters, and NG tube are stable in position. Unchanged cardiomegaly with diffuse perihilar and lower lobe predominant opacities and layering bilateral pleural effusions, similar to prior study. No pneumothorax. IMPRESSION: Stable pulmonary edema and bilateral pleural effusions. Electronically Signed   By: Titus Dubin M.D.   On: 06/14/2017 09:10      DISCUSSION: 82 year old female with history of HFpEF, necrotizing fasciitis of left lower leg, HLD, tricuspid regurg, pulmonary hypertension, CKD III, COPD on 2L at night, presents after repeat left lower extremity debridement with acute hypotension. Subsequently found to have duodenal ulcer with perforation, in hemorrhagic shock. Maxed on 4 pressors in ICU, intubated.   ASSESSMENT / PLAN:  GASTROINTESTINAL A:   Acute GI bleed 2/2 duodenal ulcer with hemorrhagic shock - s/p ex lap with repair 10/17 Shock liver; ALT 389, AST 2448 Nutrition   - NPO on TPN continues 06/15/2017  P:   UGI series once able per surgery PPI  Follow coags, CBC Continue TNA per pharm  PULMONARY A: Pulmonary Hypertension - RHC 11/2015 with PAH 59/17 mmHg Mechanically ventilated - acute resop failure  06/15/2017 - > does NOT  meet criteria for Extubation in setting of Acute Respiratory Failure due to  MODS and deconditioning and volume overload th ough doing SBT  P:   Holding home lasix, letaris Continue vent support; SBT daily, goal 5/5. Tolerating SBT  well. No extubation 06/15/2017 Extubation timing after goals of care Bronchial hygiene CXR intermittently  CARDIOVASCULAR A:  HFpEF (Last echo 05/21/2017 indicates EF 60-65%, moderate MR, mod pulm HTN) with intermittent episodes of hypotension. Hypovolemic shock requiring massive blood transfusion Paroxysmal Atrial fibrillation CAD Elevated troponin   06/15/2017 - ongoing circulatory shock  P:  Levophed  MAP goal > 65 No anticoagulation due to GIB Follow CVP Monitor intake/output  RENAL A:   AKI on CKD stage III (baseline Cr 0.8-1.1), worsening.  Continues to have anuria Hypokalemia Hypocalcemia   - ON CRRT. Lytes repletd by renal  P:   Continue CRRT per nephrology Monitor electrolytes Follow BMP q12hrs given anuria  HEMATOLOGIC  Recent Labs Lab 06/13/17 0438  06/14/17 0312  06/14/17 2250 06/15/17 0341 06/15/17 0347  HGB 11.8*  < > 11.6*  < > 11.6* 11.3* 11.2*  HCT 36.7  < > 35.6*  < > 34.0* 34.6* 33.0*  WBC 9.1  --  10.5  --   --  11.7*  --   PLT 23*  --  24*  --   --  29*  --   < > = values in this interval not displayed.  A:   Hemorrhagic shock requiring massive blood transfusions  Acute blood loss anemia - due to above - improved Thrombocytopenia - chronic low in hthe hospital and same   P:  Transfuse for Hgb < 8 due to cardiac arrest Monitor CBC  INFECTIOUS A:   Surgical wound culture 10/10 >> E coli, Pseudomonas, Morganella morganii, proteus, VRE Massive wound necrosis of LLE, will require AKA P:   S/p linezolid >> now on zosyn and daptomycin Follow wound cultures Ortho planning for left AKA once stabilized   ENDOCRINE A:   Hypoglycemia - resolved P:  Monitor glucose on BMP, CBG  NEUROLOGIC A:   Remains neurologically intact, no acute process  P:   Monitor mental status    FAMILY  - Inter-disciplinary family meet or Palliative Care meeting due by:  06/11/2017. - dpne 06/14/27. Palliative  meeting started 06/15/2017. Husband  updated 06/15/2017. Meeting planned 06/16/17 or 06/18/17.     GLOBAL Hgi risk for LTAC/TRACH poor long term outcomes. Based on pressor needs, low platetlets and CRRT 0- classic trifecta for poor 1  Year outcome after trach (PROVENT SCORE)      .  Rest per NP/medical resident whose note is outlined above and that I agree with  The patient is critically ill with multiple organ systems failure and requires high complexity decision making for assessment and support, frequent evaluation and titration of therapies, application of advanced monitoring technologies and extensive interpretation of multiple databases.   Critical Care Time devoted to patient care services described in this note is  30  Minutes. This time reflects time of care of this signee Dr Brand Males. This critical care time does not reflect procedure time, or teaching time or supervisory time of PA/NP/Med student/Med Resident etc but could involve care discussion time    Dr. Brand Males, M.D., [facility].C.P Pulmonary and Critical Care Medicine Staff Physician [facility] Pager: [phone], If no answer or between  15:00h - 7:00h: call [phone]  06/15/2017 11:34 AM

## 2017-06-15 NOTE — Progress Notes (Signed)
PHARMACY - ADULT TOTAL PARENTERAL NUTRITION CONSULT NOTE   Pharmacy Consult:  TPN Indication:  Prolonged NPO status  Patient Measurements: Height: 5' 2.99" (160 cm) Weight: 237 lb 7 oz (107.7 kg) IBW/kg (Calculated) : 52.38 TPN AdjBW (KG): 64 Body mass index is 42.07 kg/m.  Assessment:  82 year old female presented on 05/21/2017 with CHF exacerbation, hypotension and elevated troponins.  PMH significant for AFib, dHF, CKD3, GERD, HLD, CAD, and pulmonary HTN.  Patient has gangrenous left thigh/leg and underwent multiple I&Ds.  She required transfer to the ICU after the I&D on 05/25/2017 d/t hypotension from duodenal ulcer bleeding.  Patient underwent ex-lap with pyloroplasty and oversewing of the bleeding ulcer on the same day.  Per charting, patient was on a carb modified diet through 06/03/17.  She has been NPO x6 days with anticipated prolonged NPO status; therefore, pharmacy was consulted to manage TPN.  GI: GIB on IV PPI BID, drains O/P 265 mL (decreased). NGT output 100 mL (decreased). ?BL prealbumin >60.  UGI when stable. Alb 1.2. LBM 10/25. Endo: no hx DM - CBGs low-low normal prior TPN, then elevated, now controlled with less CHO in customized TPN Insulin requirements in the past 24 hours: 2 units SSI (decreased) Lytes: ?refeeding.  K+ 3.4 (given 4 KCl runs this AM), CL 96, increasing (max CL in TPN), Phos decreased to 1.3 (currently 22 mmol in TPN + given  63mmol KPhos yesterday; MD ordered 30 mmol NaPhos this AM), Mg 1.9 (incr; received 2g IV Mg yesterday), iCa 1.24 (Ca gluconate through CRRT).   Renal: CKD3 - CRRT (4/2.5 bags) started 10/23 >> present - SCr 0.49, BUN normalized - no UOP, 4L removed/24hrs (I/O -1.5L), net +11L since admit, D5NS at 10 ml/hr.  Pulm: intubated 10/17, FiO2 40%  Cards: s/p code blue, MAP 60s, brady, CVP 6, in Afib - Levophed gtt @ 8 AC: Eliquis for Afib held since 9/23 d/t LLE hematoma and massive duodenal bleed - H/H 11.2/33, plts 29K  (low-stablizing) Ortho: gangrenous left thigh/leg s/p I&D 10/5, 10/10, 10/12, 10/17.  Needs AKA. Hepatobil: LFTs improving, tbili down to 1.4 (no jaundice) Neuro: PRN Fentanyl - GCS 13, RASS at goal 0. Responds to questions. ID: Zosyn/Cubicin for infected leg wound.  Possible PNA on CXR - low temperatures on CRRT, WBC 11.7 (up).  Best Practices: CHG TPN Access: CVC placed 06/05/17 TPN start date: 06/09/17  Nutritional Goals (per RD rec on 10/19): 7673-4193 kCal and 130gm protein per day *Using weight 114 kg  Current Nutrition:  TPN  Plan:  -Continue Customized TPN (AA15%/CHO70%: 98/16) to 45 ml/hr (goal rate ~55 ml/hr), no electrolytes except NaPhos per Renal's request.  Today's TPN will provide an additional 25 mmol NaPhos per day- increased. Maximizing Chloride in TPN.  Will not advance further until lytes improve. - Hold 20% ILE for the first 7 days of TPN in ICU patients per ASPEN/SCCM guidelines (start date 06/16/17) - TPN providing 106gm of protein and 173g of CHO = 1011 kCal, meeting ~80% of total needs. - Daily multivitamin and trace elements in TPN  - Continue moderate SSI Q4H (anticipate increasing TPN to goal rate in AM, pending lytes) - F/U daily  Sloan Leiter, PharmD, BCPS, BCCCP Clinical Pharmacist Clinical phone 06/15/2017 until 3:30PM - #[phone] After hours, please call [phone] 06/15/2017, 7:15 AM

## 2017-06-15 NOTE — Progress Notes (Signed)
Daily Progress Note   Patient Name: [name]       Date: 06/15/2017 DOB: 08-20-1934  Age: 82 y.o. MRN#: [id number] Attending Physician: Raylene Miyamoto, MD Primary Care Physician: Redmond School, MD Admit Date: 05/24/2017  Reason for Consultation/Follow-up: Establishing goals of care and Psychosocial/spiritual support  Subjective: Husband at the bedside.  Chart reviewed; patient continues with ventilator support but is in the process of weaning.  They are also attempting to wean down levo fed but blood pressure remains very soft.  Patient is bradycardic and sometimes in atrial fib.  She is only received 2 PRN doses of fentanyl for pain in the past 24 hours.  She continues with oliguria on CRRT.  Thrombocytopenia present, platelets 29.  .  I did update him clinically as to the labs and overview of her  clinical condition; specifically the complexity of her clinical condition and the impact this could have on her prognosis.  The patient remains on TPN.  Per chart review, patient will require endoscopy before enteric feeds could begin  Length of Stay: 27  Current Medications: Scheduled Meds:  . brimonidine  1 drop Right Eye BID  . brinzolamide  1 drop Both Eyes BID  . chlorhexidine gluconate (MEDLINE KIT)  15 mL Mouth Rinse BID  . Chlorhexidine Gluconate Cloth  6 each Topical Daily  . docusate  100 mg Oral BID  . Gerhardt's butt cream   Topical BID  . insulin aspart  0-15 Units Subcutaneous Q4H  . latanoprost  1 drop Right Eye QHS  . mouth rinse  15 mL Mouth Rinse QID  . pantoprazole (PROTONIX) IV  40 mg Intravenous Q12H  . sodium chloride flush  3 mL Intravenous Q12H  . timolol  1 drop Both Eyes BID    Continuous Infusions: . sodium chloride    . sodium chloride 10 mL/hr  at 06/14/17 1800  . sodium chloride    . calcium gluconate infusion for CRRT 20 g (06/14/17 1800)  . DAPTOmycin (CUBICIN)  IV Stopped (06/13/17 1530)  . methocarbamol (ROBAXIN)  IV    . norepinephrine (LEVOPHED) Adult infusion 8 mcg/min (06/15/17 0700)  . piperacillin-tazobactam Stopped (06/15/17 0929)  . potassium chloride 10 mEq (06/15/17 1103)  . dialysis replacement fluid (prismasate) 500 mL/hr at 06/15/17 0903  . dialysate (PRISMASATE) 1,500  mL/hr at 06/15/17 0909  . sodium chloride    . sodium citrate 2 %/dextrose 2.5% solution 3000 mL 250 mL/hr at 06/15/17 0145  . sodium phosphate  Dextrose 5% IVPB 30 mmol (06/15/17 0901)  . TPN ADULT (ION) 45 mL/hr at 06/14/17 1800  . TPN ADULT (ION)      PRN Meds: sodium chloride, Place/Maintain arterial line **AND** sodium chloride, bisacodyl, fentaNYL (SUBLIMAZE) injection, heparin, methocarbamol **OR** methocarbamol (ROBAXIN)  IV, metoCLOPramide **OR** metoCLOPramide (REGLAN) injection, ondansetron **OR** ondansetron (ZOFRAN) IV, polyethylene glycol, sodium chloride, sodium chloride flush, sodium chloride flush  Physical Exam  Constitutional: She appears well-developed and well-nourished.  Acutely ill-appearing elderly female in ICU.  Her eyes are open but she does not seem to be as engaged  Cardiovascular:  Bradycardic; heart rate 54 and irregular  Pulmonary/Chest:  Intubated Weaning  Abdominal: Soft.  Genitourinary:  Genitourinary Comments: Foley  Neurological:  Her eyes are open.  She does not seem to be engaged with those in the room as much as she did on 06/14/2017  Skin: Skin is warm and dry. There is pallor.  Psychiatric:  Unable to test  Nursing note and vitals reviewed.           Vital Signs: BP (!) 186/159   Pulse (!) 54   Temp (!) 97.5 F (36.4 C) (Oral)   Resp (!) 24   Ht 5' 2.99" (1.6 m)   Wt 107.7 kg (237 lb 7 oz)   SpO2 97%   BMI 42.07 kg/m  SpO2: SpO2: 97 % O2 Device: O2 Device: Ventilator O2 Flow Rate:  O2 Flow Rate (L/min): 40 L/min  Intake/output summary:  Intake/Output Summary (Last 24 hours) at 06/15/17 1111 Last data filed at 06/15/17 1100  Gross per 24 hour  Intake          3185.74 ml  Output             4603 ml  Net         -1417.26 ml   LBM: Last BM Date: 06/12/17 Baseline Weight: Weight: 101.6 kg (224 lb) Most recent weight: Weight: 107.7 kg (237 lb 7 oz)       Palliative Assessment/Data:    Flowsheet Rows     Most Recent Value  Intake Tab  Referral Department  Critical care  Unit at Time of Referral  ICU  Palliative Care Primary Diagnosis  Trauma  Date Notified  06/13/17  Palliative Care Type  New Palliative care  Reason for referral  End of Life Care Assistance, Clarify Goals of Care  Date of Admission  04/19/17  Date first seen by Palliative Care  06/14/17  # of days Palliative referral response time  1 Day(s)  # of days IP prior to Palliative referral  55  Clinical Assessment  Palliative Performance Scale Score  30%  Pain Max last 24 hours  Not able to report  Pain Min Last 24 hours  Not able to report  Dyspnea Max Last 24 Hours  Not able to report  Dyspnea Min Last 24 hours  Not able to report  Nausea Max Last 24 Hours  Not able to report  Nausea Min Last 24 Hours  Not able to report  Anxiety Max Last 24 Hours  Not able to report  Anxiety Min Last 24 Hours  Not able to report  Other Max Last 24 Hours  Not able to report  Psychosocial & Spiritual Assessment  Palliative Care Outcomes  Patient/Family meeting held?  Yes  Who was at the meeting?  husband  Palliative Care follow-up planned  Yes, Facility      Patient Active Problem List   Diagnosis Date Noted  . Duodenal ulcer   . Palliative care by specialist   . AKI (acute kidney injury) ([place])   . UGIB (upper gastrointestinal bleed)   . Hemorrhagic shock ([place])   . Acute hypoxemic respiratory failure ([place])   . Pressure injury of skin 06/05/2017  . Necrotizing fasciitis of lower leg ([place])   .  Traumatic hematoma of left lower leg   . Atherosclerosis of native arteries of extremities with gangrene, left leg ([place])   . Anemia   . Elevated troponin 06/12/2017  . Hypotension   . Acute blood loss anemia 05/11/2017  . CKD (chronic kidney disease), stage III ([place]) 05/10/2017  . Thigh hematoma, left, initial encounter 05/10/2017  . Leg hematoma, left, initial encounter 05/10/2017  . Chronic respiratory failure ([place]) 07/22/2016  . Chronic rhinitis 02/08/2016  . Acute on chronic diastolic CHF (congestive heart failure) ([place]) 12/28/2015  . PAH (pulmonary artery hypertension) ([place])   . CHF (congestive heart failure) ([place]) 11/17/2015  . Acute congestive heart failure ([place]) 11/17/2015  . Pulmonary nodules 10/07/2013  . Encounter for therapeutic drug monitoring 09/22/2013  . DOE (dyspnea on exertion) 05/26/2012  . Psoriasis   . Carotid bruit   . Warfarin anticoagulation   . CAD (coronary artery disease)   . Pulmonary hypertension ([place]) 04/19/2011  . Atrial fibrillation ([place]) 11/16/2010  . GERD 04/17/2010  . COLONIC POLYPS, HX OF 04/17/2010  . Hyperlipidemia 04/17/2009  . Overweight(278.02) 04/17/2009  . Essential hypertension 04/17/2009    Palliative Care Assessment & Plan   Patient Profile: 82 y.o. female  with past medical history of chronic atrial fib, dCHF, CKD III, pulm HTN,  admitted on 06/09/2017 after traumatic leg injury 2/2 a fall. Her hospital coarse has been complicated by hypovolemic shock, emergent surgery 2/2 GIB; exp lap.She also has undergone several wound debridements 2/2 nec fasciitis.  She has remained on vent support in ICU and has been unable to wean as well as continued pressor support, CRRT . She is not making urine at this point. Also, bowel is not waking up; wound vac to abd incision; anasarca.She is currently not on sedation, alert. Answering simple questions with yes/no as well as writing on a pad.  Consult for [place] specifically L AKA, PEG, HD, LTACH    Recommendations/Plan:  Continue current plan of care  Patient remains a partial code  Family meeting scheduled with palliative medicine team and husband, 3 daughters for Tuesday, June 28, 2017 at 89  Goals of Care and Additional Recommendations:  Limitations on Scope of Treatment: Full Scope Treatment  Code Status:    Code Status Orders        Start     Ordered   06/05/17 1345  Limited resuscitation (code)  Continuous    Question Answer Comment  In the event of cardiac or respiratory ARREST: Initiate Code Blue, Call Rapid Response No   In the event of cardiac or respiratory ARREST: Perform CPR No   In the event of cardiac or respiratory ARREST: Perform Intubation/Mechanical Ventilation Yes   In the event of cardiac or respiratory ARREST: Use NIPPV/BiPAp only if indicated Yes   In the event of cardiac or respiratory ARREST: Administer ACLS medications if indicated Yes   In the event of cardiac or respiratory ARREST: Perform Defibrillation or Cardioversion if indicated Yes  06/05/17 1344    Code Status History    Date Active Date Inactive Code Status Order ID Comments User Context   05/20/2017  8:55 PM 06/05/2017  1:44 PM Full Code [id number]  Deneise Lever, MD ED   05/10/2017  8:47 PM 05/16/2017 10:29 PM Full Code [id number]  Vianne Bulls, MD ED   11/17/2015  7:51 PM 11/22/2015  2:39 PM Full Code [id number]  Jonetta Osgood, MD Inpatient       Prognosis:   Unable to determine  Discharge Planning:  To Be Determined  Care plan was discussed with Helene Kelp, RN CCM-RN  Thank you for allowing the Palliative Medicine Team to assist in the care of this patient.   Time In: 0900 Time Out: 0935 Total Time 35 min Prolonged Time Billed  no       Greater than 50%  of this time was spent counseling and coordinating care related to the above assessment and plan.  Dory Horn, NP  Please contact Palliative Medicine Team phone at [phone] for questions and  concerns.

## 2017-06-15 NOTE — Progress Notes (Signed)
11 Days Post-Op   Subjective/Chief Complaint: Pt stable ovenight, no acute events. Con't on Levo   Objective: Vital signs in last 24 hours: Temp:  [94.3 F (34.6 C)-97.5 F (36.4 C)] 94.3 F (34.6 C) (10/28 0437) Pulse Rate:  [47-72] 60 (10/28 0700) Resp:  [15-29] 18 (10/28 0700) BP: (71-190)/(53-169) 71/55 (10/28 0700) SpO2:  [91 %-100 %] 100 % (10/28 0700) Arterial Line BP: (94-352)/(37-344) 113/49 (10/28 0700) FiO2 (%):  [40 %] 40 % (10/28 0600) Weight:  [107.7 kg (237 lb 7 oz)] 107.7 kg (237 lb 7 oz) (10/28 0445) Last BM Date: 06/12/17  Intake/Output from previous day: 10/27 0701 - 10/28 0700 In: 2758 [I.V.:2508; NG/GT:50; IV Piggyback:200] Out: 4307 [Emesis/NG output:100; Drains:265] Intake/Output this shift: No intake/output data recorded.  General appearance: sedated Cardio: tachy, hypotx GI: Vac in place, JP bilious  Lab Results:   Recent Labs  06/14/17 0312  06/15/17 0341 06/15/17 0347  WBC 10.5  --  11.7*  --   HGB 11.6*  < > 11.3* 11.2*  HCT 35.6*  < > 34.6* 33.0*  PLT 24*  --  29*  --   < > = values in this interval not displayed. BMET  Recent Labs  06/14/17 2242  06/15/17 0341 06/15/17 0347  NA 135  < > 136 139  K 3.7  < > 3.4* 3.4*  CL 97*  --  96*  --   CO2 32  --  34*  --   GLUCOSE 136*  --  133*  --   BUN 7  --  8  --   CREATININE 0.48  --  0.49  --   CALCIUM 9.3  --  9.4  --   < > = values in this interval not displayed. PT/INR No results for input(s): LABPROT, INR in the last 72 hours. ABG  Recent Labs  06/14/17 2250 06/15/17 0347  PHART 7.344* 7.337*  HCO3 34.6* 34.8*    Studies/Results: Dg Chest Port 1 View  Result Date: 06/15/2017 CLINICAL DATA:  ETT present, CKD, hypotension EXAM: PORTABLE CHEST 1 VIEW COMPARISON:  06/14/2017 FINDINGS: Endotracheal tube is in place with tip approximately cm above the carina. Left IJ central line tip overlies the level of the brachiocephalic vein -SVC confluence. Right IJ central line  tip overlies the level of the superior vena cava. Stable cardiomegaly. There are bibasilar opacities, asymmetric and increased on the right. Overall, these opacities are stable. Bilateral pleural effusions. IMPRESSION: Persistent bilateral opacities and cardiomegaly. Electronically Signed   By: Nolon Nations M.D.   On: 06/15/2017 07:56   Dg Chest Port 1 View  Result Date: 06/14/2017 CLINICAL DATA:  Respiratory failure. EXAM: PORTABLE CHEST 1 VIEW COMPARISON:  Chest x-ray from yesterday. FINDINGS: Endotracheal tube, bilateral internal jugular central venous catheters, and NG tube are stable in position. Unchanged cardiomegaly with diffuse perihilar and lower lobe predominant opacities and layering bilateral pleural effusions, similar to prior study. No pneumothorax. IMPRESSION: Stable pulmonary edema and bilateral pleural effusions. Electronically Signed   By: Titus Dubin M.D.   On: 06/14/2017 09:10    Anti-infectives: Anti-infectives    Start     Dose/Rate Route Frequency Ordered Stop   06/11/17 1515  DAPTOmycin (CUBICIN) 1,000 mg in sodium chloride 0.9 % IVPB     1,000 mg 240 mL/hr over 30 Minutes Intravenous Every 48 hours 06/10/17 0734     06/10/17 1500  piperacillin-tazobactam (ZOSYN) IVPB 3.375 g     3.375 g 100 mL/hr over 30 Minutes  Intravenous Every 6 hours 06/10/17 1017     06/10/17 1200  piperacillin-tazobactam (ZOSYN) IVPB 3.375 g  Status:  Discontinued     3.375 g 12.5 mL/hr over 240 Minutes Intravenous Every 6 hours 06/10/17 0718 06/10/17 0719   06/10/17 1200  piperacillin-tazobactam (ZOSYN) IVPB 3.375 g  Status:  Discontinued     3.375 g 100 mL/hr over 30 Minutes Intravenous Every 6 hours 06/10/17 0720 06/10/17 1017   06/09/17 2300  piperacillin-tazobactam (ZOSYN) IVPB 2.25 g  Status:  Discontinued     2.25 g 100 mL/hr over 30 Minutes Intravenous Every 8 hours 06/09/17 1729 06/10/17 0714   06/09/17 2200  piperacillin-tazobactam (ZOSYN) IVPB 3.375 g  Status:  Discontinued      3.375 g 12.5 mL/hr over 240 Minutes Intravenous Every 8 hours 06/09/17 1515 06/09/17 1634   06/09/17 1645  piperacillin-tazobactam (ZOSYN) IVPB 2.25 g  Status:  Discontinued     2.25 g 100 mL/hr over 30 Minutes Intravenous Every 8 hours 06/09/17 1634 06/09/17 1729   06/09/17 1515  DAPTOmycin (CUBICIN) 750 mg in sodium chloride 0.9 % IVPB  Status:  Discontinued     750 mg 230 mL/hr over 30 Minutes Intravenous Every 48 hours 06/09/17 1500 06/10/17 0734   06/13/2017 0930  ceFAZolin (ANCEF) IVPB 2g/100 mL premix     2 g 200 mL/hr over 30 Minutes Intravenous On call to O.R. 06/03/17 1214 06/12/2017 1006   06/02/17 2200  linezolid (ZYVOX) IVPB 600 mg  Status:  Discontinued    Comments:  Pharmacy may adjust dose prn   600 mg 300 mL/hr over 60 Minutes Intravenous Every 12 hours 06/02/17 1546 06/08/17 0915   06/02/17 1545  linezolid (ZYVOX) IVPB 600 mg  Status:  Discontinued     600 mg 300 mL/hr over 60 Minutes Intravenous Every 12 hours 06/02/17 1543 06/02/17 1546   05/31/17 1500  vancomycin (VANCOCIN) 1,250 mg in sodium chloride 0.9 % 250 mL IVPB  Status:  Discontinued     1,250 mg 166.7 mL/hr over 90 Minutes Intravenous Every 24 hours 06/15/2017 1409 06/03/17 0845   05/19/2017 2000  piperacillin-tazobactam (ZOSYN) IVPB 3.375 g  Status:  Discontinued     3.375 g 12.5 mL/hr over 240 Minutes Intravenous Every 8 hours 06/01/2017 1409 06/09/17 1515   06/02/2017 1300  vancomycin (VANCOCIN) 2,000 mg in sodium chloride 0.9 % 500 mL IVPB     2,000 mg 250 mL/hr over 120 Minutes Intravenous  Once 05/26/2017 1208 05/19/2017 1657   06/01/2017 1300  piperacillin-tazobactam (ZOSYN) IVPB 3.375 g     3.375 g 100 mL/hr over 30 Minutes Intravenous  Once 06/16/2017 1208 05/27/2017 1334   05/24/2017 0745  ceFAZolin (ANCEF) IVPB 2g/100 mL premix  Status:  Discontinued     2 g 200 mL/hr over 30 Minutes Intravenous On call to O.R. 05/25/2017 0736 06/16/2017 1143   06/07/2017 1800  ceFAZolin (ANCEF) IVPB 2g/100 mL premix  Status:   Discontinued     2 g 200 mL/hr over 30 Minutes Intravenous Every 8 hours 06/16/2017 1220 05/30/17 1210   06/15/2017 0945  ceFAZolin (ANCEF) IVPB 2g/100 mL premix     2 g 200 mL/hr over 30 Minutes Intravenous To Surgery 05/27/17 2001 06/01/2017 1015   05/29/2017 2000  ceFAZolin (ANCEF) IVPB 2g/100 mL premix     2 g 200 mL/hr over 30 Minutes Intravenous Every 6 hours 06/15/2017 1643 05/24/17 1009   05/30/2017 1015  ceFAZolin (ANCEF) IVPB 2g/100 mL premix     2 g 200  mL/hr over 30 Minutes Intravenous On call to O.R. 06/01/2017 1014 06/08/2017 1400      Assessment/Plan: 57F hx of HFpEF, nec fasciitis of LLE, HLD, pHTN, CKD III, COPD on home O2 (2.5L) - was being managed by ortho for LLE wound and postop developed massive GIB - found to have DU that likely had eroded through GDA -   Bleeding duodenal ulcer and hemorrhagic shock - S/P Exploratory laparotomy with pyloroplasty and oversewing bleeding duodenal ulcer, Dr. Brantley Stage, 10/17 - JP drain output steady, bilious appearing - still requiring levo - weaning off vent per CCM  FEN: NPO, NGT/ TNA VTE: SCD's, no chemical prophylaxis in setting of GIB ID: Zosyn 10/12>>Zyvox 10/15>> - Surgical wound culture 10/10 >> E coli, Pseudomonas, Morganella morganii, proteus, VRE Foley: yes, oliguric, AKI on CRRT Follow up: TBD  DISPO: Con't wound vac and changes as scheduled Will need upper GI when stable - before able to start enteric feeds I spoke with her daughter in law   LOS: 65 days    [name]., Anne Hahn 06/15/2017

## 2017-06-15 NOTE — Progress Notes (Signed)
Subjective: Interval History: has no complaint, on vent doing SBTs.  Objective: Vital signs in last 24 hours: Temp:  [94.3 F (34.6 C)-97.5 F (36.4 C)] 94.3 F (34.6 C) (10/28 0437) Pulse Rate:  [47-72] 60 (10/28 0700) Resp:  [15-31] 18 (10/28 0700) BP: (71-190)/(53-169) 71/55 (10/28 0700) SpO2:  [91 %-100 %] 100 % (10/28 0700) Arterial Line BP: (94-352)/(37-344) 113/49 (10/28 0700) FiO2 (%):  [40 %] 40 % (10/28 0600) Weight:  [107.7 kg (237 lb 7 oz)] 107.7 kg (237 lb 7 oz) (10/28 0445) Weight change: 2.6 kg (5 lb 11.7 oz)  Intake/Output from previous day: 10/27 0701 - 10/28 0700 In: 2758 [I.V.:2508; NG/GT:50; IV Piggyback:200] Out: 4307 [Emesis/NG output:100; Drains:265] Intake/Output this shift: No intake/output data recorded.  General appearance: moderately obese, pale and lethargic but awake and acknowleges Neck: LIJ HD cath Resp: rales bibasilar Cardio: S1, S2 normal and systolic murmur: systolic ejection 2/6, decrescendo at 2nd left intercostal space GI: obese, no bs, vac midline, drain RUQ Extremities: edema 3+  Lab Results:  Recent Labs  06/14/17 0312  06/15/17 0341 06/15/17 0347  WBC 10.5  --  11.7*  --   HGB 11.6*  < > 11.3* 11.2*  HCT 35.6*  < > 34.6* 33.0*  PLT 24*  --  29*  --   < > = values in this interval not displayed. BMET:  Recent Labs  06/14/17 2242  06/15/17 0341 06/15/17 0347  NA 135  < > 136 139  K 3.7  < > 3.4* 3.4*  CL 97*  --  96*  --   CO2 32  --  34*  --   GLUCOSE 136*  --  133*  --   BUN 7  --  8  --   CREATININE 0.48  --  0.49  --   CALCIUM 9.3  --  9.4  --   < > = values in this interval not displayed. No results for input(s): PTH in the last 72 hours. Iron Studies: No results for input(s): IRON, TIBC, TRANSFERRIN, FERRITIN in the last 72 hours.  Studies/Results: Dg Chest Port 1 View  Result Date: 06/14/2017 CLINICAL DATA:  Respiratory failure. EXAM: PORTABLE CHEST 1 VIEW COMPARISON:  Chest x-ray from yesterday.  FINDINGS: Endotracheal tube, bilateral internal jugular central venous catheters, and NG tube are stable in position. Unchanged cardiomegaly with diffuse perihilar and lower lobe predominant opacities and layering bilateral pleural effusions, similar to prior study. No pneumothorax. IMPRESSION: Stable pulmonary edema and bilateral pleural effusions. Electronically Signed   By: Titus Dubin M.D.   On: 06/14/2017 09:10    I have reviewed the patient's current medications.  Assessment/Plan: 1 AKI tol vol off, will push net neg.  Good solute/acid/base.  Had low K ??wrong dialysate  . Repleted k, phos 2 VDRF per CCM 3 Vol xs 4 Nutrition TNA 5 Ulcer with bleed, oversew  No bowel activity 6 Fasciitis  Needs amp 7 obesity 8 Pulm HTN lower vol slowly 9 low Ptlt ?? Why ongoing when more stable 10 cellulitis with sepis DAPTO/Zosyn P CRRT, ^ net neg, cont TNA, vent AB    LOS: 27 days   [name] 06/15/2017,7:35 AM

## 2017-06-16 ENCOUNTER — Other Ambulatory Visit: Payer: Self-pay | Admitting: Licensed Clinical Social Worker

## 2017-06-16 DIAGNOSIS — Z515 Encounter for palliative care: Secondary | ICD-10-CM

## 2017-06-16 DIAGNOSIS — Z7189 Other specified counseling: Secondary | ICD-10-CM

## 2017-06-16 LAB — RENAL FUNCTION PANEL
ALBUMIN: 1 g/dL — AB (ref 3.5–5.0)
ANION GAP: 8 (ref 5–15)
BUN: 15 mg/dL (ref 6–20)
CALCIUM: 9.9 mg/dL (ref 8.9–10.3)
CO2: 36 mmol/L — ABNORMAL HIGH (ref 22–32)
Chloride: 94 mmol/L — ABNORMAL LOW (ref 101–111)
Creatinine, Ser: 0.6 mg/dL (ref 0.44–1.00)
GFR calc Af Amer: >60 mL/min (ref >60)
GLUCOSE: 123 mg/dL — AB (ref 65–99)
PHOSPHORUS: 2.5 mg/dL (ref 2.5–4.6)
POTASSIUM: 3.3 mmol/L — AB (ref 3.5–5.1)
SODIUM: 138 mmol/L (ref 135–145)

## 2017-06-16 LAB — GLUCOSE, CAPILLARY
GLUCOSE-CAPILLARY: 107 mg/dL — AB (ref 65–99)
GLUCOSE-CAPILLARY: 119 mg/dL — AB (ref 65–99)
Glucose-Capillary: 127 mg/dL — ABNORMAL HIGH (ref 65–99)

## 2017-06-16 LAB — CBC
HEMATOCRIT: 35.9 % — AB (ref 36.0–46.0)
HEMOGLOBIN: 11.5 g/dL — AB (ref 12.0–15.0)
MCH: 29.6 pg (ref 26.0–34.0)
MCHC: 32 g/dL (ref 30.0–36.0)
MCV: 92.3 fL (ref 78.0–100.0)
Platelets: 44 10*3/uL — ABNORMAL LOW (ref 150–400)
RBC: 3.89 MIL/uL (ref 3.87–5.11)
RDW: 17.3 % — ABNORMAL HIGH (ref 11.5–15.5)
WBC: 14.7 10*3/uL — AB (ref 4.0–10.5)

## 2017-06-16 LAB — COMPREHENSIVE METABOLIC PANEL
ALK PHOS: 229 U/L — AB (ref 38–126)
ALT: 19 U/L (ref 14–54)
AST: 48 U/L — AB (ref 15–41)
Albumin: 1.1 g/dL — ABNORMAL LOW (ref 3.5–5.0)
Anion gap: 7 (ref 5–15)
BILIRUBIN TOTAL: 1.7 mg/dL — AB (ref 0.3–1.2)
BUN: 13 mg/dL (ref 6–20)
CALCIUM: 10.1 mg/dL (ref 8.9–10.3)
CO2: 36 mmol/L — ABNORMAL HIGH (ref 22–32)
CREATININE: 0.57 mg/dL (ref 0.44–1.00)
Chloride: 95 mmol/L — ABNORMAL LOW (ref 101–111)
GFR calc Af Amer: >60 mL/min (ref >60)
Glucose, Bld: 108 mg/dL — ABNORMAL HIGH (ref 65–99)
Potassium: 3.6 mmol/L (ref 3.5–5.1)
Sodium: 138 mmol/L (ref 135–145)
TOTAL PROTEIN: 4.7 g/dL — AB (ref 6.5–8.1)

## 2017-06-16 LAB — DIFFERENTIAL
BASOS PCT: 0 %
Basophils Absolute: 0 10*3/uL (ref 0.0–0.1)
EOS ABS: 0.1 10*3/uL (ref 0.0–0.7)
Eosinophils Relative: 1 %
Lymphocytes Relative: 8 %
Lymphs Abs: 1.2 10*3/uL (ref 0.7–4.0)
MONO ABS: 0.7 10*3/uL (ref 0.1–1.0)
Monocytes Relative: 5 %
NEUTROS ABS: 12.7 10*3/uL — AB (ref 1.7–7.7)
Neutrophils Relative %: 86 %

## 2017-06-16 LAB — PREALBUMIN: Prealbumin: 6.2 mg/dL — ABNORMAL LOW (ref 18–38)

## 2017-06-16 LAB — PHOSPHORUS: PHOSPHORUS: 2.1 mg/dL — AB (ref 2.5–4.6)

## 2017-06-16 LAB — CK: Total CK: 20 U/L — ABNORMAL LOW (ref 38–234)

## 2017-06-16 LAB — TRIGLYCERIDES: Triglycerides: 86 mg/dL (ref <150)

## 2017-06-16 LAB — MAGNESIUM: MAGNESIUM: 1.8 mg/dL (ref 1.7–2.4)

## 2017-06-16 MED ORDER — TRACE MINERALS CR-CU-MN-SE-ZN 10-1000-500-60 MCG/ML IV SOLN
INTRAVENOUS | Status: DC
  Administered 2017-06-16: 18:00:00 via INTRAVENOUS
  Filled 2017-06-16: qty 862.4

## 2017-06-16 MED ORDER — INSULIN ASPART 100 UNIT/ML ~~LOC~~ SOLN
0.0000 [IU] | SUBCUTANEOUS | Status: DC
  Administered 2017-06-16: 1 [IU] via SUBCUTANEOUS

## 2017-06-16 MED ORDER — SODIUM CHLORIDE 0.9 % IV SOLN
15.0000 mmol | Freq: Once | INTRAVENOUS | Status: AC
  Administered 2017-06-16: 15 mmol via INTRAVENOUS
  Filled 2017-06-16: qty 5

## 2017-06-16 MED ORDER — SODIUM CHLORIDE 0.9 % IV SOLN
5.0000 mg/h | INTRAVENOUS | Status: DC
  Administered 2017-06-16: 2.5 mg/h via INTRAVENOUS
  Filled 2017-06-16: qty 10

## 2017-06-16 MED ORDER — IOPAMIDOL (ISOVUE-300) INJECTION 61%
INTRAVENOUS | Status: AC
  Filled 2017-06-16: qty 30

## 2017-06-16 MED ORDER — MORPHINE BOLUS VIA INFUSION
1.0000 mg | INTRAVENOUS | Status: DC | PRN
  Filled 2017-06-16: qty 2

## 2017-06-16 NOTE — Progress Notes (Addendum)
PHARMACY - ADULT TOTAL PARENTERAL NUTRITION CONSULT NOTE   Pharmacy Consult:  TPN Indication:  Prolonged NPO status  Patient Measurements: Height: 5' 2.99" (160 cm) Weight: 227 lb 15.3 oz (103.4 kg) IBW/kg (Calculated) : 52.38 TPN AdjBW (KG): 64 Body mass index is 40.39 kg/m.  Assessment:  82 year old female presented on 06/03/2017 with CHF exacerbation, hypotension and elevated troponins.  PMH significant for AFib, dHF, CKD3, GERD, HLD, CAD, and pulmonary HTN.  Patient has gangrenous left thigh/leg and underwent multiple I&Ds.  She required transfer to the ICU after the I&D on 05/31/2017 d/t hypotension from duodenal ulcer bleeding.  Patient underwent ex-lap with pyloroplasty and oversewing of the bleeding ulcer on the same day.  Per charting, patient was on a carb modified diet through 06/03/17.  She has been NPO x6 days with anticipated prolonged NPO status; therefore, pharmacy was consulted to manage TPN.  GI: GIB on IV PPI BID, drains O/P 660 mL, NGT O/P 100 mL, LBM 10/25. ?BL prealbumin >60, now at 6.2.  Docusate.  UGI when stable.  Endo: no hx DM - CBGs low-low normal prior TPN, then elevated, now controlled with less CHO in customized TPN  Insulin requirements in the past 24 hours: 0 unit Lytes: CL low at 95, CO2 high at 36 (max CL in TPN), Phos 2.1, iCa 0.7-1.23 (Ca gluconate through CRRT), others WNL Renal: CKD3 - CRRT started 10/23 - SCr 0.57, BUN normalized - no UOP, net +10.5L since admit, D5NS at 10 ml/hr Pulm: intubated 10/17, FiO2 40%.  SBTs. Cards: s/p code blue, MAP 70s, intermittent brady, CVP 5, in Afib - Levophed gtt at 14 AC: Eliquis for Afib held since 9/23 d/t LLE hematoma and massive duodenal bleed - H/H stable, plts improved to 44K Ortho: gangrenous left thigh/leg s/p I&D 10/5, 10/10, 10/12, 10/17.  Needs AKA. Hepatobil: shocked liver - LFTs trended up slightly, tbili up to 1.7 (no jaundice), TG WNL Neuro: PRN Fentanyl - GCS 13, CPOT 0, RASS at goal 0 ID:  Zosyn/Cubicin for infected leg wound.  Low tem on CRRT, WBC down to 14.7 Best Practices: CHG TPN Access: CVC placed 06/05/17 TPN start date: 06/09/17  Nutritional Goals (per RD rec on 10/19): 3267-1245 kCal and 130gm protein per day *Using weight 114 kg  Current Nutrition:  TPN   Plan:  - Continue Customized TPN, add lipids since it is day #8 of TPN (AA/CHO/lipid: 98/15/21), increase TPN to goal rate of 55 ml/hr, no electrolytes except NaPhos per Renal's request.   - TPN providing 1468 kCal and 129 gm of protein per day, meeting 100% of patient's needs. - Today's TPN will provide an additional 33 mmol NaPhos per day (increased).  Maximizing chloride in TPN.   - Daily multivitamin and trace elements in TPN  - Reduce SSI Q4H to sensitive - F/U daily, family meeting on Tues for [facility]   [place] Wenzlick D. Mina Marble, PharmD, BCPS Pager:  [phone] - [phone] 06/16/2017, 7:41 AM    ==========================   Addendum: No phos supplementation as of now NaPhos 15 mmol IV x 1 F/U AM labs   Andrell Bergeson D. Mina Marble, PharmD, BCPS Pager:  [phone] 06/16/2017, 2:35 PM

## 2017-06-16 NOTE — Progress Notes (Signed)
PULMONARY / CRITICAL CARE MEDICINE   Name: [name] MRN: [id number] DOB: 1935-05-22    ADMISSION DATE:  05/29/2017 CONSULTATION DATE:  06/16/2017  REFERRING MD:  Dr. Sherral Hammers  CHIEF COMPLAINT:  Acute hypotension  Brief 82 year old female with history of HFpEF, necrotizing fasciitis of left lower leg, HLD, tricuspid regurg, pulmonary hypertension, CKD III, COPD on 2.5L at night presents with hypotension requiring pressor support after repeat left leg wound debridement earlier today (10/17). Patient was in her usual state of health until 9/22 when she presented for traumatic left leg hematoma and hypovolemic shock. She was admitted to the hospital 05/10/17-05/16/2017 and subsequently discharged to SNF where she experienced worsening weakness. She was then readmitted with acute on chronic HFpEF exacerbation on 10/1.  During this hospital stay patient continued to have hemodynamic instability requiring 8 units PRBC over her hospital course. She has been taken back to the OR  On 10/5, 10/10, 10/12, and 10/17 for further wound debridement. Today, after her procedure was completed and wound vac in place, she was in PACU where her blood pressure was noted to drop to 69G systolic. She was given albumin. Neo was initiated. Patient indicated she was dizzy and lightheaded. 3u PRBC were administered. PCCM was called for consult.  Of note patient had grossly bloody stool in the PACU with her hypotensive episode. Bowel movement was described as formed but grossly bloody/in a pool of blood. She denies prior episodes of melena or hematochezia. She endorses being constipated over past few days.   STUDIES:  Renal US 10/22 > no lesions or obstructing stone.  CULTURES: Surgical wound culture 10/10 >> E coli, Pseudomonas, Morganella morganii, proteus, VRE  ANTIBIOTICS: 10/10 Ancef >> 10/12 10/15 Linezolid >> 10/21 10/12 Zosyn >> Daptomycin 10/22 >>  LINES/TUBES: PIV x3 Foley catheter Negative pressure  wound L leg Closed system drain RLQ bulb CVC triple lumen NG/OG   SIGNIFICANT EVENTS: 10/5 Left thigh and left leg debridement 10/10 Repeat irrigation and debridement left thigh and left leg 10/12 Debridement left leg and thigh 10/17 Debridement left thigh wound 10/17 EGD, exploratory laparotomy with bleeding duodenal ulcer/perforated duodenum. Intubated, maxed on 4 pressors in ICU. 10/23- CVVHD 10/26-  A line placed yesterday. Renal recommending fluid off. Ortho planning for AKA. No acute events overnight. 10/27 - palliative care meeting with family. On TPN. Oliguric ATN. Has low phos with refeeding. On levophed and needs increasing. wEaning 10/5 on SBT but very deconditioned and frail.    SUBJECTIVE pallaitive and family meet planned for Jun 28, 2017. No acute events overnight.   VITAL SIGNS: BP (!) 119/52   Pulse 69   Temp 98 F (36.7 C) (Oral)   Resp (!) 24   Ht 5' 2.99" (1.6 m)   Wt 227 lb 15.3 oz (103.4 kg)   SpO2 100%   BMI 40.39 kg/m   HEMODYNAMICS: CVP:  [5 mmHg] 5 mmHg  VENTILATOR SETTINGS: Vent Mode: PRVC FiO2 (%):  [40 %] 40 % Set Rate:  [15 bmp-16 bmp] 16 bmp Vt Set:  [330 mL] 330 mL PEEP:  [5 cmH20] 5 cmH20 Pressure Support:  [10 cmH20] 10 cmH20 Plateau Pressure:  [12 cmH20-13 cmH20] 12 cmH20  INTAKE / OUTPUT: I/O last 3 completed shifts: In: 5043.6 [I.V.:4114.6; NG/GT:50; IV Piggyback:879] Out: 7926 [Emesis/NG output:100; Drains:665; Other:7161]  PHYSICAL EXAMINATION: EYE: no conjunctival injection or pallor, EOMI ENMT: ETT RESPIRATORY: +coarse breath sounds anteriorly, no W/R/R CV: RRR, no m/r/g, +generalized edema GI: +wound vac in place, bandage clean and  dry, abdomen mildly distended, JP drain with bilious fluid SKIN: LLE bandage in place clean and dry, wound vac in place NEURO: alert, following commands  LABS:  PULMONARY  Recent Labs Lab 06/14/17 2046 06/14/17 2250 06/15/17 0347 06/15/17 1631 06/15/17 1639 06/15/17 2320  06/15/17 2326  PHART 7.335* 7.344* 7.337* 7.340*  --  7.368  --   PCO2ART 66.0* 63.0* 64.2* 73.7*  --  62.9*  --   PO2ART 120.0* 130.0* 139.0* 144.0*  --  109.0*  --   HCO3 35.5* 34.6* 34.8* 40.2* 35.7* 36.7* 36.2*  TCO2 38* 37* 37* 43* 38* 39* 39*  O2SAT 98.0 99.0 99.0 99.0 61.0 98.0 37.0    CBC  Recent Labs Lab 06/14/17 0312  06/15/17 0341  06/15/17 2320 06/15/17 2326 06/16/17 0355  HGB 11.6*  < > 11.3*  < > 11.2* 11.9* 11.5*  HCT 35.6*  < > 34.6*  < > 33.0* 35.0* 35.9*  WBC 10.5  --  11.7*  --   --   --  14.7*  PLT 24*  --  29*  --   --   --  44*  < > = values in this interval not displayed.  COAGULATION No results for input(s): INR in the last 168 hours.  CARDIAC  No results for input(s): TROPONINI in the last 168 hours. No results for input(s): PROBNP in the last 168 hours.   CHEMISTRY  Recent Labs Lab 06/12/17 0423  06/13/17 3382  06/14/17 0312  06/14/17 1918  06/14/17 2242  06/15/17 0341  06/15/17 1600 06/15/17 1631 06/15/17 1639 06/15/17 2320 06/15/17 2326 06/16/17 0355  NA 138  < > 136  < > 136  < > 139  < > 135  < > 136  < > 134* 138 140 138 140 138  K 3.6  < > 3.4*  < > 3.3*  < > 2.4*  < > 3.7  < > 3.4*  < > 3.9 4.0 3.7 3.5 3.5 3.6  CL 96*  < > 96*  < > 96*  --  109  --  97*  --  96*  --  93*  --   --   --   --  95*  CO2 32  < > 33*  < > 32  --  25  --  32  --  34*  --  33*  --   --   --   --  36*  GLUCOSE 131*  < > 127*  < > 113*  --  104*  --  136*  --  133*  --  115*  --   --   --   --  108*  BUN 15  < > 13  < > 9  --  6  --  7  --  8  --  11  --   --   --   --  13  CREATININE 0.83  < > 0.71  < > 0.50  --  0.34*  --  0.48  --  0.49  --  0.55  --   --   --   --  0.57  CALCIUM 9.2  < > 10.2  < > 9.7  --  6.8*  --  9.3  --  9.4  --  9.4  --   --   --   --  10.1  MG 2.0  --  1.8  --  1.7  --   --   --   --   --  1.9  --   --   --   --   --   --  1.8  PHOS 1.2*  < > 1.6*  < > 1.8*  --  1.2*  --  1.4*  --  1.3*  --  2.5  --   --   --   --  2.1*  < >  = values in this interval not displayed. Estimated Creatinine Clearance: 64.5 mL/min (by C-G formula based on SCr of 0.57 mg/dL).   LIVER  Recent Labs Lab 06/10/17 0555  06/12/17 0423  06/14/17 1918 06/14/17 2242 06/15/17 0341 06/15/17 1600 06/16/17 0355  AST 49*  --  27  --   --   --   --   --  48*  ALT 11*  --  9*  --   --   --   --   --  19  ALKPHOS 139*  --  138*  --   --   --   --   --  229*  BILITOT 1.6*  --  1.4*  --   --   --   --   --  1.7*  PROT 4.2*  --  4.7*  --   --   --   --   --  4.7*  ALBUMIN 1.4*  < > 1.3*  < > <1.0* 1.2* 1.1* 1.1* 1.1*  < > = values in this interval not displayed.  INFECTIOUS No results for input(s): LATICACIDVEN, PROCALCITON in the last 168 hours.  ENDOCRINE CBG (last 3)   Recent Labs  06/15/17 1949 06/15/17 2321 06/16/17 0403  GLUCAP 84 113* 107*   Dg Chest Port 1 View  Result Date: 06/15/2017 CLINICAL DATA:  ETT present, CKD, hypotension EXAM: PORTABLE CHEST 1 VIEW COMPARISON:  06/14/2017 FINDINGS: Endotracheal tube is in place with tip approximately cm above the carina. Left IJ central line tip overlies the level of the brachiocephalic vein -SVC confluence. Right IJ central line tip overlies the level of the superior vena cava. Stable cardiomegaly. There are bibasilar opacities, asymmetric and increased on the right. Overall, these opacities are stable. Bilateral pleural effusions. IMPRESSION: Persistent bilateral opacities and cardiomegaly. Electronically Signed   By: Nolon Nations M.D.   On: 06/15/2017 07:56   DISCUSSION: 82 year old female with history of HFpEF, necrotizing fasciitis of left lower leg, HLD, tricuspid regurg, pulmonary hypertension, CKD III, COPD on 2L at night, presents after repeat left lower extremity debridement with acute hypotension. Subsequently found to have duodenal ulcer with perforation, in hemorrhagic shock. Maxed on 4 pressors in ICU, intubated.  ASSESSMENT / PLAN:  GASTROINTESTINAL A:   Acute GI  bleed 2/2 duodenal ulcer with hemorrhagic shock - s/p ex lap with repair 10/17 Shock liver; ALT 389, AST 2448 Nutrition  - NPO on TPN continues 06/16/2017 P:   UGI series once able per surgery PPI  Follow coags, CBC Continue TNA per pharm  PULMONARY A: Pulmonary Hypertension - RHC 11/2015 with PAH 59/17 mmHg Mechanically ventilated - acute resop failure 06/16/2017 - > does NOT  meet criteria for Extubation in setting of Acute Respiratory Failure due to  MODS and deconditioning and volume overload th ough doing SBT P:   Holding home lasix, letaris Continue vent support; SBT daily, goal 5/5. Tolerating SBT well.  Extubation timing after goals of care Bronchial hygiene CXR intermittently  CARDIOVASCULAR A:  HFpEF (Last echo 05/21/2017 indicates EF 60-65%, moderate MR, mod pulm HTN) with intermittent episodes of hypotension. Hypovolemic  shock requiring massive blood transfusion Paroxysmal Atrial fibrillation CAD Elevated troponin  06/16/2017 - ongoing circulatory shock P:  Levophed at 15 MAP goal > 65 No anticoagulation due to GIB Follow CVP Monitor intake/output  RENAL A:   AKI on CKD stage III (baseline Cr 0.8-1.1), worsening.  Continues to have anuria Hypokalemia Hypocalcemia  - ON CRRT. Lytes repletd by renal P:   Continue CRRT per nephrology Monitor electrolytes Follow BMP q12hrs given anuria  HEMATOLOGIC  Recent Labs Lab 06/14/17 0312  06/15/17 0341  06/15/17 2320 06/15/17 2326 06/16/17 0355  HGB 11.6*  < > 11.3*  < > 11.2* 11.9* 11.5*  HCT 35.6*  < > 34.6*  < > 33.0* 35.0* 35.9*  WBC 10.5  --  11.7*  --   --   --  14.7*  PLT 24*  --  29*  --   --   --  44*  < > = values in this interval not displayed.  A:   Hemorrhagic shock requiring massive blood transfusions  Acute blood loss anemia - due to above - improved Thrombocytopenia - chronic low in the hospital  P:  Transfuse for Hgb < 8 Monitor CBC  INFECTIOUS A:   Surgical wound culture 10/10 >>  E coli, Pseudomonas, Morganella morganii, proteus, VRE Massive wound necrosis of LLE, will require AKA P:   S/p linezolid >> now on zosyn and daptomycin Follow wound cultures Ortho planning for left AKA once stabilized   ENDOCRINE A:   Hypoglycemia - resolved P:  Monitor glucose on BMP, CBG  NEUROLOGIC A:   Remains neurologically intact, no acute process  P:   Monitor mental status  FAMILY  - Inter-disciplinary family meet or Palliative Care meeting due by:  06/11/2017. - dpne 06/14/27. Palliative meeting started 06/16/2017. Husband updated 06/16/2017. Meeting planned 06/16/17 or 07-09-2017.   Everrett Coombe, MD PGY 2 Family Practice  Attending Note:  82 year old female with extensive past medical history and a very complicated hospital stay.  On exam, abdomen is extremely tender with left leg that is necrotizing at this point.  I reviewed CXR myself, ETT is in good position.  Spoke with husband and daughter extensively.  After a long discussion, decision was made to make patient a full DNR, will start morphine drip for comfort and once family is ready will discuss discontinuation of ventilator.  Cancel CT for now.  Focus more on comfort til family is ready.  The patient is critically ill with multiple organ systems failure and requires high complexity decision making for assessment and support, frequent evaluation and titration of therapies, application of advanced monitoring technologies and extensive interpretation of multiple databases.   Critical Care Time devoted to patient care services described in this note is  35  Minutes. This time reflects time of care of this signee Dr Jennet Maduro. This critical care time does not reflect procedure time, or teaching time or supervisory time of PA/NP/Med student/Med Resident etc but could involve care discussion time.  Rush Farmer, M.D. [facility] Pulmonary/Critical Care Medicine. Pager: [phone]. After hours pager: [phone].

## 2017-06-16 NOTE — Progress Notes (Signed)
Nutrition Follow-up  DOCUMENTATION CODES:   Morbid obesity  INTERVENTION:    Continue TPN for now, recommend d/c TPN if decision made for comfort care.   NUTRITION DIAGNOSIS:   Inadequate oral intake related to inability to eat as evidenced by NPO status.  Ongoing  GOAL:   Provide needs based on ASPEN/SCCM guidelines  Met with TPN  MONITOR:   Vent status, Skin, I & O's, Labs  ASSESSMENT:   82 yo female with PMH of HTN, HLD, CAD, morbid obesity, GERD, degenerative arthritis, recent fall at home (2 weeks PTA) who was admitted on 10/1 with acute on chronic CHF exacerbation, hypotension, elevated troponin, and L leg hematoma. S/P I&D of L thigh and leg on 10/5 for necrotizing fasciitis, I&D was repeated on 10/10, 10/12, 10/17. Found to have GI bleeding from perforated duodenal ulcer and underwent exploratory laparotomy, oversewing, and pyloroplasty on 10/17. Remained on vent post-op.  Discussed patient in ICU rounds and with RN today. Remains on CRRT. Patient is receiving TPN, increasing rate to 55 ml/h today to provide 1320 ml, 1438 kcal, 129 gm protein per day to meet 100% of estimated needs. Palliative Care Team is planning a family meeting tomorrow morning. Per review of progress notes, patient has a poor prognosis. She is now receiving a morphine drip and will likely be transitioned to comfort care soon.   Patient remains intubated on ventilator support Temp (24hrs), Avg:96.9 F (36.1 C), Min:95.9 F (35.5 C), Max:98 F (36.7 C)  Labs and medications reviewed.   Diet Order:  Diet NPO time specified TPN ADULT (ION) TPN ADULT (ION)  EDUCATION NEEDS:   No education needs identified at this time  Skin:  Skin Assessment: Skin Integrity Issues: Skin Integrity Issues:: Stage II, Incisions Stage II:  (coccyx, buttocks, & thigh) Incisions:  (leg incision; abd incision with VAC)  Last BM:  10/25 (type 3)  Height:   Ht Readings from Last 1 Encounters:  05/21/2017 5'  2.99" (1.6 m)    Weight:   Wt Readings from Last 1 Encounters:  06/16/17 227 lb 15.3 oz (103.4 kg)    Ideal Body Weight:  52.3 kg  BMI:  Body mass index is 40.39 kg/m.  Estimated Nutritional Needs:   Kcal:  1740-8144  Protein:  130 gm  Fluid:  1.8-2 L   Molli Barrows, RD, LDN, [place] Pager [phone] After Hours Pager [phone]

## 2017-06-16 NOTE — Progress Notes (Signed)
06/16/2017   1700 renal panel. K+3.3. Dr Joelyn Oms aware. Reviewed MAR. No new orders received.   Mick Sell RN

## 2017-06-16 NOTE — Progress Notes (Signed)
S: Intubated, smiles, eyes closed, nodding to questions  O:BP (!) 77/40   Pulse 92   Temp 97.8 F (36.6 C) (Axillary)   Resp (!) 26   Ht 5' 2.99" (1.6 m)   Wt 227 lb 15.3 oz (103.4 kg)   SpO2 100%   BMI 40.39 kg/m   Intake/Output Summary (Last 24 hours) at 06/16/17 1215 Last data filed at 06/16/17 1200  Gross per 24 hour  Intake          3543.89 ml  Output             5698 ml  Net         -2154.11 ml   Intake/Output: I/O last 3 completed shifts: In: 5684.3 [I.V.:4645.3; NG/GT:60; IV Piggyback:979] Out: 9798 [Emesis/NG output:200; Drains:770; Other:7775]  Intake/Output this shift:  Total I/O In: 690.5 [I.V.:640.5; IV Piggyback:50] Out: 704 [Drains:50; Other:654] Weight change: -9 lb 7.7 oz (-4.3 kg) Gen: appears tired, less interactive, bitting on ETT  CVS: RRR, no appreciated murmur  Resp:ETT, lungs clear to auscultation over anterior lung fields  Abd: OG tube, BS -, RUQ drain, wound vac  Ext: 1+ lower extremity pitting edema   Recent Labs Lab 06/10/17 0555  06/12/17 0423  06/13/17 1614  06/14/17 0312  06/14/17 1918  06/14/17 2242  06/15/17 0341 06/15/17 0347 06/15/17 1600 06/15/17 1631 06/15/17 1639 06/15/17 2320 06/15/17 2326 06/16/17 0355  NA 142  < > 138  < > 136  < > 136  < > 139  < > 135  < > 136 139 134* 138 140 138 140 138  K 3.8  < > 3.6  < > 3.3*  < > 3.3*  < > 2.4*  < > 3.7  < > 3.4* 3.4* 3.9 4.0 3.7 3.5 3.5 3.6  CL 107  < > 96*  < > 97*  --  96*  --  109  --  97*  --  96*  --  93*  --   --   --   --  95*  CO2 22  < > 32  < > 31  --  32  --  25  --  32  --  34*  --  33*  --   --   --   --  36*  GLUCOSE 109*  < > 131*  < > 114*  --  113*  --  104*  --  136*  --  133*  --  115*  --   --   --   --  108*  BUN 63*  < > 15  < > 10  --  9  --  6  --  7  --  8  --  11  --   --   --   --  13  CREATININE 3.22*  < > 0.83  < > 0.64  --  0.50  --  0.34*  --  0.48  --  0.49  --  0.55  --   --   --   --  0.57  ALBUMIN 1.4*  < > 1.3*  < > 1.2*  --  1.2*  --  <1.0*   --  1.2*  --  1.1*  --  1.1*  --   --   --   --  1.1*  CALCIUM 8.0*  < > 9.2  < > 10.0  --  9.7  --  6.8*  --  9.3  --  9.4  --  9.4  --   --   --   --  10.1  PHOS 5.1*  < > 1.2*  < > 2.1*  --  1.8*  --  1.2*  --  1.4*  --  1.3*  --  2.5  --   --   --   --  2.1*  AST 49*  --  27  --   --   --   --   --   --   --   --   --   --   --   --   --   --   --   --  48*  ALT 11*  --  9*  --   --   --   --   --   --   --   --   --   --   --   --   --   --   --   --  19  < > = values in this interval not displayed. Liver Function Tests:  Recent Labs Lab 06/10/17 0555  06/12/17 0423  06/15/17 0341 06/15/17 1600 06/16/17 0355  AST 49*  --  27  --   --   --  48*  ALT 11*  --  9*  --   --   --  19  ALKPHOS 139*  --  138*  --   --   --  229*  BILITOT 1.6*  --  1.4*  --   --   --  1.7*  PROT 4.2*  --  4.7*  --   --   --  4.7*  ALBUMIN 1.4*  < > 1.3*  < > 1.1* 1.1* 1.1*  < > = values in this interval not displayed. No results for input(s): LIPASE, AMYLASE in the last 168 hours. No results for input(s): AMMONIA in the last 168 hours. CBC:  Recent Labs Lab 06/10/17 0555  06/12/17 0823  06/13/17 0438  06/14/17 0312  06/15/17 0341  06/15/17 2320 06/15/17 2326 06/16/17 0355  WBC 14.8*  < > 8.6  --  9.1  --  10.5  --  11.7*  --   --   --  14.7*  NEUTROABS 13.4*  --   --   --   --   --   --   --   --   --   --   --  12.7*  HGB 11.7*  < > 12.6  < > 11.8*  < > 11.6*  < > 11.3*  < > 11.2* 11.9* 11.5*  HCT 37.6  < > 38.3  < > 36.7  < > 35.6*  < > 34.6*  < > 33.0* 35.0* 35.9*  MCV 94.0  < > 91.4  --  91.1  --  90.8  --  90.8  --   --   --  92.3  PLT 46*  < > 18*  --  23*  --  24*  --  29*  --   --   --  44*  < > = values in this interval not displayed. Cardiac Enzymes:  Recent Labs Lab 06/10/17 0555 06/13/17 0438 06/16/17 0355  CKTOTAL <5* 15* 20*   CBG:  Recent Labs Lab 06/15/17 1623 06/15/17 1949 06/15/17 2321 06/16/17 0403 06/16/17 0837  GLUCAP 120* 84 113* 107* 127*    Iron  Studies: No results for input(s): IRON, TIBC, TRANSFERRIN, FERRITIN in the last 72 hours. Studies/Results: Dg  Chest Port 1 View  Result Date: 06/15/2017 CLINICAL DATA:  ETT present, CKD, hypotension EXAM: PORTABLE CHEST 1 VIEW COMPARISON:  06/14/2017 FINDINGS: Endotracheal tube is in place with tip approximately cm above the carina. Left IJ central line tip overlies the level of the brachiocephalic vein -SVC confluence. Right IJ central line tip overlies the level of the superior vena cava. Stable cardiomegaly. There are bibasilar opacities, asymmetric and increased on the right. Overall, these opacities are stable. Bilateral pleural effusions. IMPRESSION: Persistent bilateral opacities and cardiomegaly. Electronically Signed   By: Nolon Nations M.D.   On: 06/15/2017 07:56   . brimonidine  1 drop Right Eye BID  . brinzolamide  1 drop Both Eyes BID  . chlorhexidine gluconate (MEDLINE KIT)  15 mL Mouth Rinse BID  . Chlorhexidine Gluconate Cloth  6 each Topical Daily  . docusate  100 mg Oral BID  . Gerhardt's butt cream   Topical BID  . insulin aspart  0-9 Units Subcutaneous Q4H  . iopamidol      . latanoprost  1 drop Right Eye QHS  . mouth rinse  15 mL Mouth Rinse QID  . pantoprazole (PROTONIX) IV  40 mg Intravenous Q12H  . sodium chloride flush  3 mL Intravenous Q12H  . timolol  1 drop Both Eyes BID    BMET    Component Value Date/Time   NA 138 06/16/2017 0355   K 3.6 06/16/2017 0355   CL 95 (L) 06/16/2017 0355   CO2 36 (H) 06/16/2017 0355   GLUCOSE 108 (H) 06/16/2017 0355   BUN 13 06/16/2017 0355   CREATININE 0.57 06/16/2017 0355   CREATININE 1.10 (H) 01/31/2016 1118   CALCIUM 10.1 06/16/2017 0355   GFRNONAA >60 06/16/2017 0355   GFRAA >60 06/16/2017 0355   CBC    Component Value Date/Time   WBC 14.7 (H) 06/16/2017 0355   RBC 3.89 06/16/2017 0355   HGB 11.5 (L) 06/16/2017 0355   HCT 35.9 (L) 06/16/2017 0355   PLT 44 (L) 06/16/2017 0355   MCV 92.3 06/16/2017 0355   MCH  29.6 06/16/2017 0355   MCHC 32.0 06/16/2017 0355   RDW 17.3 (H) 06/16/2017 0355   LYMPHSABS 1.2 06/16/2017 0355   MONOABS 0.7 06/16/2017 0355   EOSABS 0.1 06/16/2017 0355   BASOSABS 0.0 06/16/2017 0355     Assessment/Plan:  1. AKI 2/2 hemorrhagic shock -  CRRT continues to pull fluid off, NET -2.4 yesterday. Goal today is net -100 cc/hr. Still no urine output.  2. VDRF, pulmonary HTN - continued SBTs but not meeting criteria for extubation  3. Refeeding syndrome - on TPA, K improved, phos still low, receiving NaPhos with TPN  4. Ruptured duodenal ulcer s/p repair - concern for leak from duodenal closure, CT with oral contrast ordered  5. Necrotizing fascitis - in need of amputation, on daptomycin and zosyn  6. Thrombocytopenia - platelets are improved today  7. Shock - BP stable with levophed  8. pAfib - holding anticoagulation for GIB  9. Disposition - Morphine gtt started today, made DNR, ongoing family discussion regarding switching to full comfort   Ledell Noss, MD

## 2017-06-16 NOTE — Progress Notes (Signed)
Daily Progress Note   Patient Name: [name]       Date: 06/16/2017 DOB: Dec 19, 1934  Age: 82 y.o. MRN#: [id number] Attending Physician: Raylene Miyamoto, MD Primary Care Physician: Redmond School, MD Admit Date: 06/03/2017  Reason for Consultation/Follow-up: Establishing goals of care and Psychosocial/spiritual support  Subjective: Husband at the bedside.  Chart reviewed; patient continues with ventilator support, CRRT, and vasopressor support. She continues with oliguria on CRRT. Now on morphine infusion.   Length of Stay: 28  Current Medications: Scheduled Meds:  . brimonidine  1 drop Right Eye BID  . brinzolamide  1 drop Both Eyes BID  . chlorhexidine gluconate (MEDLINE KIT)  15 mL Mouth Rinse BID  . Chlorhexidine Gluconate Cloth  6 each Topical Daily  . docusate  100 mg Oral BID  . Gerhardt's butt cream   Topical BID  . insulin aspart  0-9 Units Subcutaneous Q4H  . iopamidol      . latanoprost  1 drop Right Eye QHS  . mouth rinse  15 mL Mouth Rinse QID  . pantoprazole (PROTONIX) IV  40 mg Intravenous Q12H  . sodium chloride flush  3 mL Intravenous Q12H  . timolol  1 drop Both Eyes BID    Continuous Infusions: . sodium chloride    . sodium chloride 10 mL/hr at 06/14/17 1800  . sodium chloride    . calcium gluconate infusion for CRRT 20 g (06/16/17 5248)  . DAPTOmycin (CUBICIN)  IV Stopped (06/15/17 1633)  . methocarbamol (ROBAXIN)  IV    . morphine 2.5 mg/hr (06/16/17 1121)  . norepinephrine (LEVOPHED) Adult infusion 14 mcg/min (06/16/17 0700)  . piperacillin-tazobactam Stopped (06/16/17 1027)  . dialysis replacement fluid (prismasate) 500 mL/hr at 06/16/17 0906  . dialysate (PRISMASATE) 1,500 mL/hr at 06/16/17 1239  . sodium chloride    . sodium citrate  2 %/dextrose 2.5% solution 3000 mL 250 mL/hr at 06/16/17 0249  . TPN ADULT (ION) 45 mL/hr at 06/15/17 1759  . TPN ADULT (ION)      PRN Meds: sodium chloride, Place/Maintain arterial line **AND** sodium chloride, bisacodyl, fentaNYL (SUBLIMAZE) injection, heparin, methocarbamol **OR** methocarbamol (ROBAXIN)  IV, metoCLOPramide **OR** metoCLOPramide (REGLAN) injection, ondansetron **OR** ondansetron (ZOFRAN) IV, polyethylene glycol, sodium chloride, sodium chloride flush, sodium chloride flush  Physical Exam  Constitutional: She appears well-developed  and well-nourished. She appears toxic. She is intubated.  Thin, frail  Cardiovascular: Normal rate.   Pulmonary/Chest: No accessory muscle usage. No tachypnea. She is intubated. No respiratory distress.  Intubated  Abdominal: Normal appearance.  Genitourinary:  Genitourinary Comments: Foley  Neurological:  Eyes open spontaneously but not interacting. Did not stimulate. Sedated on vent.   Skin: Skin is warm and dry. There is pallor.  Nursing note and vitals reviewed.           Vital Signs: BP (!) 77/40   Pulse 63   Temp 97.8 F (36.6 C) (Axillary)   Resp (!) 29   Ht 5' 2.99" (1.6 m)   Wt 103.4 kg (227 lb 15.3 oz)   SpO2 100%   BMI 40.39 kg/m  SpO2: SpO2: 100 % O2 Device: O2 Device: Ventilator O2 Flow Rate: O2 Flow Rate (L/min): 40 L/min  Intake/output summary:   Intake/Output Summary (Last 24 hours) at 06/16/17 1336 Last data filed at 06/16/17 1300  Gross per 24 hour  Intake          3284.49 ml  Output             5904 ml  Net         -2619.51 ml   LBM: Last BM Date: 06/12/17 Baseline Weight: Weight: 101.6 kg (224 lb) Most recent weight: Weight: 103.4 kg (227 lb 15.3 oz)       Palliative Assessment/Data:    Flowsheet Rows     Most Recent Value  Intake Tab  Referral Department  Critical care  Unit at Time of Referral  ICU  Palliative Care Primary Diagnosis  Trauma  Date Notified  06/13/17  Palliative Care Type   New Palliative care  Reason for referral  End of Life Care Assistance, Clarify Goals of Care  Date of Admission  04/19/17  Date first seen by Palliative Care  06/14/17  # of days Palliative referral response time  1 Day(s)  # of days IP prior to Palliative referral  55  Clinical Assessment  Palliative Performance Scale Score  30%  Pain Max last 24 hours  Not able to report  Pain Min Last 24 hours  Not able to report  Dyspnea Max Last 24 Hours  Not able to report  Dyspnea Min Last 24 hours  Not able to report  Nausea Max Last 24 Hours  Not able to report  Nausea Min Last 24 Hours  Not able to report  Anxiety Max Last 24 Hours  Not able to report  Anxiety Min Last 24 Hours  Not able to report  Other Max Last 24 Hours  Not able to report  Psychosocial & Spiritual Assessment  Palliative Care Outcomes  Patient/Family meeting held?  Yes  Who was at the meeting?  husband  Palliative Care follow-up planned  Yes, Facility      Patient Active Problem List   Diagnosis Date Noted  . Duodenal ulcer   . Palliative care by specialist   . AKI (acute kidney injury) ([place])   . UGIB (upper gastrointestinal bleed)   . Hemorrhagic shock ([place])   . Acute hypoxemic respiratory failure ([place])   . Pressure injury of skin 05/20/2017  . Necrotizing fasciitis of lower leg ([place])   . Traumatic hematoma of left lower leg   . Atherosclerosis of native arteries of extremities with gangrene, left leg ([place])   . Anemia   . Elevated troponin 06/10/2017  . Hypotension   . Acute blood loss  anemia 05/11/2017  . CKD (chronic kidney disease), stage III ([place]) 05/10/2017  . Thigh hematoma, left, initial encounter 05/10/2017  . Leg hematoma, left, initial encounter 05/10/2017  . Chronic respiratory failure ([place]) 07/22/2016  . Chronic rhinitis 02/08/2016  . Acute on chronic diastolic CHF (congestive heart failure) ([place]) 12/28/2015  . PAH (pulmonary artery hypertension) ([place])   . CHF (congestive heart failure) ([place])  11/17/2015  . Acute congestive heart failure ([place]) 11/17/2015  . Pulmonary nodules 10/07/2013  . Encounter for therapeutic drug monitoring 09/22/2013  . DOE (dyspnea on exertion) 05/26/2012  . Psoriasis   . Carotid bruit   . Warfarin anticoagulation   . CAD (coronary artery disease)   . Pulmonary hypertension ([place]) 04/19/2011  . Atrial fibrillation ([place]) 11/16/2010  . GERD 04/17/2010  . COLONIC POLYPS, HX OF 04/17/2010  . Hyperlipidemia 04/17/2009  . Overweight(278.02) 04/17/2009  . Essential hypertension 04/17/2009    Palliative Care Assessment & Plan   Patient Profile: 82 y.o. female  with past medical history of chronic atrial fib, dCHF, CKD III, pulm HTN,  admitted on 06/03/2017 after traumatic leg injury 2/2 a fall. Her hospital coarse has been complicated by hypovolemic shock, emergent surgery 2/2 GIB; exp lap.She also has undergone several wound debridements 2/2 nec fasciitis.  She has remained on vent support in ICU and has been unable to wean as well as continued pressor support, CRRT . She is not making urine at this point. Also, bowel is not waking up; wound vac to abd incision; anasarca.  She has been placed on morphine infusion after conversation with husband with Dr. Nelda Marseille. Met with husband at bedside who confirms the goal for keeping her comfortable and out of pain. However, he tells me that he does not recall discussing removal of dialysis and vent. He does say that he has family coming "in a day or so." We discussed next steps with stopping dialysis and even removal of vent to keep her comfortable and discussed briefly that these measures are only prolonging at this point. Emotional support provided. Will follow up again in the morning to assist with transition to comfort care.   Recommendations/Plan:  DNR  Family meeting was originally scheduled with palliative medicine team and husband, 3 daughters for Tuesday, 06/30/17 at 57.   Goals of Care and Additional  Recommendations:  Limitations on Scope of Treatment: Comfort focused care  Code Status: DNR  Prognosis:   Likely days or less.   Discharge Planning:  To Be Determined. Anticipate hospital death.   Care plan was discussed with Helene Kelp, RN  Thank you for allowing the Palliative Medicine Team to assist in the care of this patient.   Total Time 25 min Prolonged Time Billed  no       Greater than 50%  of this time was spent counseling and coordinating care related to the above assessment and plan.  Vinie Sill, NP Palliative Medicine Team Pager # [phone] (M-F 8a-5p) Team Phone # [phone] (Nights/Weekends)  Please contact Palliative Medicine Team phone at [phone] for questions and concerns.

## 2017-06-16 NOTE — Patient Outreach (Signed)
Assessment:  CSW spoke via phone with [name], spouse of client, on 06/16/17. CSW verified identity of [name].  Client has signed [facility] written consent giving [facility] staff permission to communicate with [name] regarding client needs and status. .Client sees Dr. Gerarda Fraction, as primary care doctor in the community. Client has been receiving care at [facility] in [place], [place].  She is currently intubated and is receiving nursing care at [facility] social worker has talked with client and famiiliy representative about discharge plans for client. Client is hoping to be able to  go to a skilled nursing facility in [place], [place] when client is able. Client has family support.  CSW has encouraged client to participate in scheduled client physical therapy sessions for client in next 30 days at skilled nursing center (upon client discharge from the hospital). Client has support from her spouse and from her two daughters. CSW has provided [name] and his daughter, [name], the phone number for [facility] CSW. CSW has encouraged [name] or [name] to call CSW at [phone] as needed to discuss social work needs of client.  CSW has previously discussed client case and client's current care needs in Difficult Case Discussion meetings with [facility] staff members .  Client is aware of [facility] program services in pharmacy, social work and in nursing.     Plan:  Client to participate in scheduled client physical therapy sessions for client in the next 30 days at skilled nursing center  (upon client discharge from the hospital ).  CSW to call client or family representative in 3 weeks to assess client needs at that time.   Norva Riffle.Jadrien Narine MSW, LCSW Licensed Clinical Social Worker [facility] Care Management [phone]

## 2017-06-16 NOTE — Progress Notes (Signed)
Pharmacy Antibiotic Note  [name] is a 82 y.o. female with a PMH of CKD III and a recent hospitalization for a traumatic left leg hematoma was readmitted on 05/21/2017 with a necrotizing LLE wound infection.  Had several I&Ds and then became hemodynamically unstable on 10/17 due to a large duodenal ulcer which required a massive blood transfusion after bursting during an endoscopy. Is s/p exploratory laperotomy. Wound cx abundantly grew several species, most notably pseudomonas and VRE. Was being maintained on zyvox and zosyn, but due to worsening thrombocytopenia, zyvox was discontinued on 10/21.  She was initiated on CRRT on 10/23. Pharmacy has been consulted for daptomycin and zosyn dosing. Daptomycin approved by ID (Dr. Baxter Flattery) to cover VRE. Currently using TBW to dose, but with extreme of obesity, may consider using ABW going forward.  Today, she remains intubated, on levophed, on CRRT and TNA. WBC are trending up and it is possible that CRRT may be masking fevers. Platelets remain low but are improving. She has not made any urine since 10/18. CK level increased from 15 to 20. Per RN, wound appears very necrosed and current plan is to amputate limb once she is more stable.   Plan: Zosyn 3.375 g IV q6h Daptomycin 1000 mg IV q48h (8 mg/kg) CPK q72h Follow renal function, clinical course and CRRT plans F/u on palliative care plans  ------------------------------------------------- Height: 5' 2.99" (160 cm) Weight: 227 lb 15.3 oz (103.4 kg) IBW/kg (Calculated) : 52.38  Temp (24hrs), Avg:96.9 F (36.1 C), Min:95.9 F (35.5 C), Max:98 F (36.7 C)   Recent Labs Lab 06/12/17 0823  06/13/17 0438  06/14/17 0312 06/14/17 1918 06/14/17 2242 06/15/17 0341 06/15/17 1600 06/16/17 0355  WBC 8.6  --  9.1  --  10.5  --   --  11.7*  --  14.7*  CREATININE  --   < > 0.71  < > 0.50 0.34* 0.48 0.49 0.55 0.57  < > = values in this interval not displayed.  Estimated Creatinine Clearance:  64.5 mL/min (by C-G formula based on SCr of 0.57 mg/dL).    No Known Allergies  Antimicrobials this admission: Vanc 10/12 >> 10/15 Linezolid 10/15 >> 10/21 Zosyn 10/12 >> Daptomycin 10/22 >>  Dose adjustments this admission: CRRT: Increased zosyn to q6h; Increased daptomycin to 1000 mg.   Microbiology results: Wound Cx: E coli, Pseudomonas, Morganella morganii, Proteus mirabilis, VRE (sensitive to linezolid)  10/2 MRSA PCR: neg  Thank you for allowing pharmacy to be a part of this patient's care.  Sallyanne Havers, PharmD Candidate 06/16/2017 12:51 PM  I discussed / reviewed the pharmacy note by Ms. Dunn, PharmD Candidate and I agree with the student's findings and plans as documented.  Sloan Leiter, PharmD, BCPS, BCCCP Clinical Pharmacist Clinical phone 06/16/2017 until 3:30PM [phone] After hours, please call [phone] 06/16/2017, 12:59 PM

## 2017-06-16 NOTE — Progress Notes (Signed)
12 Days Post-Op   Subjective/Chief Complaint: On vent, pressors No acute changes   Objective: Vital signs in last 24 hours: Temp:  [95.9 F (35.5 C)-98 F (36.7 C)] 98 F (36.7 C) (10/29 0400) Pulse Rate:  [52-95] 82 (10/29 0754) Resp:  [15-29] 24 (10/29 0754) BP: (101-219)/(44-178) 109/52 (10/29 0754) SpO2:  [91 %-100 %] 100 % (10/29 0754) Arterial Line BP: (72-138)/(36-65) 121/55 (10/29 0715) FiO2 (%):  [40 %] 40 % (10/29 0754) Weight:  [103.4 kg (227 lb 15.3 oz)] 103.4 kg (227 lb 15.3 oz) (10/29 0400) Last BM Date: 06/12/17  Intake/Output from previous day: 10/28 0701 - 10/29 0700 In: 3966.8 [I.V.:3157.8; NG/GT:30; IV Piggyback:779] Out: 7616 [Emesis/NG output:100; Drains:680] Intake/Output this shift: No intake/output data recorded.  Exam: Abdomen mildly tender, anasarca Drain bilious VAC in place  Lab Results:   Recent Labs  06/15/17 0341  06/15/17 2326 06/16/17 0355  WBC 11.7*  --   --  14.7*  HGB 11.3*  < > 11.9* 11.5*  HCT 34.6*  < > 35.0* 35.9*  PLT 29*  --   --  44*  < > = values in this interval not displayed. BMET  Recent Labs  06/15/17 1600  06/15/17 2326 06/16/17 0355  NA 134*  < > 140 138  K 3.9  < > 3.5 3.6  CL 93*  --   --  95*  CO2 33*  --   --  36*  GLUCOSE 115*  --   --  108*  BUN 11  --   --  13  CREATININE 0.55  --   --  0.57  CALCIUM 9.4  --   --  10.1  < > = values in this interval not displayed. PT/INR No results for input(s): LABPROT, INR in the last 72 hours. ABG  Recent Labs  06/15/17 1631  06/15/17 2320 06/15/17 2326  PHART 7.340*  --  7.368  --   HCO3 40.2*  < > 36.7* 36.2*  < > = values in this interval not displayed.  Studies/Results: Dg Chest Port 1 View  Result Date: 06/15/2017 CLINICAL DATA:  ETT present, CKD, hypotension EXAM: PORTABLE CHEST 1 VIEW COMPARISON:  06/14/2017 FINDINGS: Endotracheal tube is in place with tip approximately cm above the carina. Left IJ central line tip overlies the level of  the brachiocephalic vein -SVC confluence. Right IJ central line tip overlies the level of the superior vena cava. Stable cardiomegaly. There are bibasilar opacities, asymmetric and increased on the right. Overall, these opacities are stable. Bilateral pleural effusions. IMPRESSION: Persistent bilateral opacities and cardiomegaly. Electronically Signed   By: Nolon Nations M.D.   On: 06/15/2017 07:56    Anti-infectives: Anti-infectives    Start     Dose/Rate Route Frequency Ordered Stop   06/11/17 1515  DAPTOmycin (CUBICIN) 1,000 mg in sodium chloride 0.9 % IVPB     1,000 mg 240 mL/hr over 30 Minutes Intravenous Every 48 hours 06/10/17 0734     06/10/17 1500  piperacillin-tazobactam (ZOSYN) IVPB 3.375 g     3.375 g 100 mL/hr over 30 Minutes Intravenous Every 6 hours 06/10/17 1017     06/10/17 1200  piperacillin-tazobactam (ZOSYN) IVPB 3.375 g  Status:  Discontinued     3.375 g 12.5 mL/hr over 240 Minutes Intravenous Every 6 hours 06/10/17 0718 06/10/17 0719   06/10/17 1200  piperacillin-tazobactam (ZOSYN) IVPB 3.375 g  Status:  Discontinued     3.375 g 100 mL/hr over 30 Minutes Intravenous Every 6 hours  06/10/17 0720 06/10/17 1017   06/09/17 2300  piperacillin-tazobactam (ZOSYN) IVPB 2.25 g  Status:  Discontinued     2.25 g 100 mL/hr over 30 Minutes Intravenous Every 8 hours 06/09/17 1729 06/10/17 0714   06/09/17 2200  piperacillin-tazobactam (ZOSYN) IVPB 3.375 g  Status:  Discontinued     3.375 g 12.5 mL/hr over 240 Minutes Intravenous Every 8 hours 06/09/17 1515 06/09/17 1634   06/09/17 1645  piperacillin-tazobactam (ZOSYN) IVPB 2.25 g  Status:  Discontinued     2.25 g 100 mL/hr over 30 Minutes Intravenous Every 8 hours 06/09/17 1634 06/09/17 1729   06/09/17 1515  DAPTOmycin (CUBICIN) 750 mg in sodium chloride 0.9 % IVPB  Status:  Discontinued     750 mg 230 mL/hr over 30 Minutes Intravenous Every 48 hours 06/09/17 1500 06/10/17 0734   05/28/2017 0930  ceFAZolin (ANCEF) IVPB 2g/100 mL  premix     2 g 200 mL/hr over 30 Minutes Intravenous On call to O.R. 06/03/17 1214 06/14/2017 1006   06/02/17 2200  linezolid (ZYVOX) IVPB 600 mg  Status:  Discontinued    Comments:  Pharmacy may adjust dose prn   600 mg 300 mL/hr over 60 Minutes Intravenous Every 12 hours 06/02/17 1546 06/08/17 0915   06/02/17 1545  linezolid (ZYVOX) IVPB 600 mg  Status:  Discontinued     600 mg 300 mL/hr over 60 Minutes Intravenous Every 12 hours 06/02/17 1543 06/02/17 1546   05/31/17 1500  vancomycin (VANCOCIN) 1,250 mg in sodium chloride 0.9 % 250 mL IVPB  Status:  Discontinued     1,250 mg 166.7 mL/hr over 90 Minutes Intravenous Every 24 hours 05/27/2017 1409 06/03/17 0845   05/25/2017 2000  piperacillin-tazobactam (ZOSYN) IVPB 3.375 g  Status:  Discontinued     3.375 g 12.5 mL/hr over 240 Minutes Intravenous Every 8 hours 05/21/2017 1409 06/09/17 1515   06/16/2017 1300  vancomycin (VANCOCIN) 2,000 mg in sodium chloride 0.9 % 500 mL IVPB     2,000 mg 250 mL/hr over 120 Minutes Intravenous  Once 05/27/2017 1208 05/21/2017 1657   06/06/2017 1300  piperacillin-tazobactam (ZOSYN) IVPB 3.375 g     3.375 g 100 mL/hr over 30 Minutes Intravenous  Once 05/29/2017 1208 05/23/2017 1334   06/14/2017 0745  ceFAZolin (ANCEF) IVPB 2g/100 mL premix  Status:  Discontinued     2 g 200 mL/hr over 30 Minutes Intravenous On call to O.R. 06/07/2017 0736 05/19/2017 1143   06/03/2017 1800  ceFAZolin (ANCEF) IVPB 2g/100 mL premix  Status:  Discontinued     2 g 200 mL/hr over 30 Minutes Intravenous Every 8 hours 06/15/2017 1220 05/26/2017 1210   05/22/2017 0945  ceFAZolin (ANCEF) IVPB 2g/100 mL premix     2 g 200 mL/hr over 30 Minutes Intravenous To Surgery 05/27/17 2001 06/07/2017 1015   05/21/2017 2000  ceFAZolin (ANCEF) IVPB 2g/100 mL premix     2 g 200 mL/hr over 30 Minutes Intravenous Every 6 hours 05/26/2017 1643 05/24/17 1009   05/28/2017 1015  ceFAZolin (ANCEF) IVPB 2g/100 mL premix     2 g 200 mL/hr over 30 Minutes Intravenous On call to O.R.  06/16/2017 1014 06/09/2017 1400      Assessment/Plan: s/p Procedure(s): EXPLORATORY LAPAROTOMY REPAIR OF DUODENAL ULCER (N/A)  I am worried that she has a leak from the duodenal closure.  Will get a CT scan with contrast down the NG to evaluate.  LOS: 28 days    Khiem Gargis A 06/16/2017

## 2017-06-17 LAB — POCT I-STAT 7, (LYTES, BLD GAS, ICA,H+H)
Acid-Base Excess: 10 mmol/L — ABNORMAL HIGH (ref 0.0–2.0)
BICARBONATE: 38.6 mmol/L — AB (ref 20.0–28.0)
Calcium, Ion: 1.27 mmol/L (ref 1.15–1.40)
HEMATOCRIT: 34 % — AB (ref 36.0–46.0)
Hemoglobin: 11.6 g/dL — ABNORMAL LOW (ref 12.0–15.0)
O2 Saturation: 98 %
PCO2 ART: 69.8 mmHg — AB (ref 32.0–48.0)
PO2 ART: 102 mmHg (ref 83.0–108.0)
Patient temperature: 96.1
Potassium: 3.3 mmol/L — ABNORMAL LOW (ref 3.5–5.1)
SODIUM: 138 mmol/L (ref 135–145)
TCO2: 41 mmol/L — ABNORMAL HIGH (ref 22–32)
pH, Arterial: 7.344 — ABNORMAL LOW (ref 7.350–7.450)

## 2017-06-17 LAB — POCT I-STAT EG7
ACID-BASE EXCESS: 7 mmol/L — AB (ref 0.0–2.0)
ACID-BASE EXCESS: 8 mmol/L — AB (ref 0.0–2.0)
Acid-Base Excess: 5 mmol/L — ABNORMAL HIGH (ref 0.0–2.0)
BICARBONATE: 33.2 mmol/L — AB (ref 20.0–28.0)
Bicarbonate: 37.3 mmol/L — ABNORMAL HIGH (ref 20.0–28.0)
Bicarbonate: 37.7 mmol/L — ABNORMAL HIGH (ref 20.0–28.0)
CALCIUM ION: 0.71 mmol/L — AB (ref 1.15–1.40)
CALCIUM ION: 0.77 mmol/L — AB (ref 1.15–1.40)
Calcium, Ion: 1.1 mmol/L — ABNORMAL LOW (ref 1.15–1.40)
HCT: 32 % — ABNORMAL LOW (ref 36.0–46.0)
HEMATOCRIT: 37 % (ref 36.0–46.0)
HEMATOCRIT: 36 % (ref 36.0–46.0)
HEMOGLOBIN: 12.2 g/dL (ref 12.0–15.0)
Hemoglobin: 12.6 g/dL (ref 12.0–15.0)
Hemoglobin: 10.9 g/dL — ABNORMAL LOW (ref 12.0–15.0)
O2 SAT: 49 %
O2 SAT: 57 %
O2 Saturation: 62 %
PCO2 VEN: 69.5 mmHg — AB (ref 44.0–60.0)
PH VEN: 7.262 (ref 7.250–7.430)
PH VEN: 7.283 (ref 7.250–7.430)
PO2 VEN: 29 mmHg — AB (ref 32.0–45.0)
POTASSIUM: 3.2 mmol/L — AB (ref 3.5–5.1)
Patient temperature: 96
Patient temperature: 97.3
Potassium: 3.2 mmol/L — ABNORMAL LOW (ref 3.5–5.1)
Potassium: 2.8 mmol/L — ABNORMAL LOW (ref 3.5–5.1)
SODIUM: 140 mmol/L (ref 135–145)
Sodium: 140 mmol/L (ref 135–145)
Sodium: 141 mmol/L (ref 135–145)
TCO2: 40 mmol/L — AB (ref 22–32)
TCO2: 35 mmol/L — AB (ref 22–32)
TCO2: 40 mmol/L — AB (ref 22–32)
pCO2, Ven: 81.3 mmHg (ref 44.0–60.0)
pCO2, Ven: 80.7 mmHg (ref 44.0–60.0)
pH, Ven: 7.273 (ref 7.250–7.430)
pO2, Ven: 34 mmHg (ref 32.0–45.0)
pO2, Ven: 36 mmHg (ref 32.0–45.0)

## 2017-06-17 LAB — RENAL FUNCTION PANEL
ALBUMIN: 1.1 g/dL — AB (ref 3.5–5.0)
Anion gap: 7 (ref 5–15)
BUN: 16 mg/dL (ref 6–20)
CHLORIDE: 95 mmol/L — AB (ref 101–111)
CO2: 36 mmol/L — ABNORMAL HIGH (ref 22–32)
CREATININE: 0.56 mg/dL (ref 0.44–1.00)
Calcium: 10.1 mg/dL (ref 8.9–10.3)
GFR calc Af Amer: >60 mL/min (ref >60)
GLUCOSE: 111 mg/dL — AB (ref 65–99)
PHOSPHORUS: 2.5 mg/dL (ref 2.5–4.6)
Potassium: 3.2 mmol/L — ABNORMAL LOW (ref 3.5–5.1)
Sodium: 138 mmol/L (ref 135–145)

## 2017-06-17 LAB — GLUCOSE, CAPILLARY: Glucose-Capillary: 96 mg/dL (ref 65–99)

## 2017-06-17 LAB — CBC
HCT: 35.5 % — ABNORMAL LOW (ref 36.0–46.0)
Hemoglobin: 11.4 g/dL — ABNORMAL LOW (ref 12.0–15.0)
MCH: 29.8 pg (ref 26.0–34.0)
MCHC: 32.1 g/dL (ref 30.0–36.0)
MCV: 92.7 fL (ref 78.0–100.0)
PLATELETS: 39 10*3/uL — AB (ref 150–400)
RBC: 3.83 MIL/uL — ABNORMAL LOW (ref 3.87–5.11)
RDW: 18 % — AB (ref 11.5–15.5)
WBC: 15.1 10*3/uL — AB (ref 4.0–10.5)

## 2017-06-18 ENCOUNTER — Other Ambulatory Visit: Payer: Self-pay | Admitting: Licensed Clinical Social Worker

## 2017-06-18 ENCOUNTER — Encounter: Payer: Self-pay | Admitting: Licensed Clinical Social Worker

## 2017-06-18 NOTE — Patient Outreach (Signed)
Assessment:  CSW communicated with Marthenia Rolling, [place], regarding client status and needs.  Marthenia Rolling informed CSW on 2017/06/22 that [place] died in the hospital on 2017-06-21.  CSW had been following client care issues and needs. Seth Ward, had also been following client  care issues and needs.  Due to fact that client died in the hospital on 21-Jun-2017, CSW will discharge client from [facility] services on 06-22-2017.    Plan:  CSW is discharging [place] on 06/22/17 from [place] services due to death of client on Jun 21, 2017.  CSW to inform Alycia Rossetti, Case Management Assistant, that [place] discharged client from [facility] CSW services on Jun 22, 2017.  CSW to fax physician case closure letter to Dr. Gerarda Fraction informing Dr. Gerarda Fraction that [facility] discharged client from [place] services on June 22, 2017 due to the death of client on 2017-06-21.  Norva Riffle.Tanija Germani MSW, LCSW Licensed Clinical Social Worker [facility] Care Management [phone]

## 2017-06-19 NOTE — Progress Notes (Signed)
13 Days Post-Op   Subjective/Chief Complaint: On vent   Objective: Vital signs in last 24 hours: Temp:  [94.1 F (34.5 C)-97.3 F (36.3 C)] 97.3 F (36.3 C) (10/30 0806) Pulse Rate:  [46-92] 80 (10/30 1000) Resp:  [14-30] 20 (10/30 1000) BP: (77-96)/(36-47) 94/47 (10/30 0750) SpO2:  [97 %-100 %] 99 % (10/30 1000) Arterial Line BP: (73-102)/(29-59) 87/51 (10/30 1000) FiO2 (%):  [40 %] 40 % (10/30 0900) Weight:  [102.3 kg (225 lb 8.5 oz)] 102.3 kg (225 lb 8.5 oz) (10/30 0433) Last BM Date: 06/12/17  Intake/Output from previous day: 10/29 0701 - 10/30 0700 In: 3889 [I.V.:3438; IV Piggyback:451] Out: 4098 [Emesis/NG output:200; Drains:220] Intake/Output this shift: Total I/O In: 536.6 [I.V.:466.6; NG/GT:20; IV Piggyback:50] Out: 732 [Drains:25; Other:707]  General appearance: no distress Resp: rhonchi bilaterally Cardio: irregularly irregular rhythm GI: soft, VAC on wound CV correct tachy RRR Lab Results:   Recent Labs  06/16/17 0355 July 01, 2017 0405  2017/07/01 0812 2017/07/01 0817  WBC 14.7* 15.1*  --   --   --   HGB 11.5* 11.4*  < > 12.2 10.9*  HCT 35.9* 35.5*  < > 36.0 32.0*  PLT 44* 39*  --   --   --   < > = values in this interval not displayed. BMET  Recent Labs  06/16/17 1715 2017/07/01 0405  Jul 01, 2017 0812 07/01/17 0817  NA 138 138  < > 140 141  K 3.3* 3.2*  < > 3.2* 2.8*  CL 94* 95*  --   --   --   CO2 36* 36*  --   --   --   GLUCOSE 123* 111*  --   --   --   BUN 15 16  --   --   --   CREATININE 0.60 0.56  --   --   --   CALCIUM 9.9 10.1  --   --   --   < > = values in this interval not displayed. PT/INR No results for input(s): LABPROT, INR in the last 72 hours. ABG  Recent Labs  06/15/17 2320  07-01-2017 0420  2017/07/01 0812 2017-07-01 0817  PHART 7.368  --  7.344*  --   --   --   HCO3 36.7*  < > 38.6*  < > 37.7* 33.2*  < > = values in this interval not displayed.  Studies/Results: No results found.  Anti-infectives: Anti-infectives    Start     Dose/Rate Route Frequency Ordered Stop   06/11/17 1515  DAPTOmycin (CUBICIN) 1,000 mg in sodium chloride 0.9 % IVPB     1,000 mg 240 mL/hr over 30 Minutes Intravenous Every 48 hours 06/10/17 0734     06/10/17 1500  piperacillin-tazobactam (ZOSYN) IVPB 3.375 g     3.375 g 100 mL/hr over 30 Minutes Intravenous Every 6 hours 06/10/17 1017     06/10/17 1200  piperacillin-tazobactam (ZOSYN) IVPB 3.375 g  Status:  Discontinued     3.375 g 12.5 mL/hr over 240 Minutes Intravenous Every 6 hours 06/10/17 0718 06/10/17 0719   06/10/17 1200  piperacillin-tazobactam (ZOSYN) IVPB 3.375 g  Status:  Discontinued     3.375 g 100 mL/hr over 30 Minutes Intravenous Every 6 hours 06/10/17 0720 06/10/17 1017   06/09/17 2300  piperacillin-tazobactam (ZOSYN) IVPB 2.25 g  Status:  Discontinued     2.25 g 100 mL/hr over 30 Minutes Intravenous Every 8 hours 06/09/17 1729 06/10/17 0714   06/09/17 2200  piperacillin-tazobactam (ZOSYN) IVPB 3.375 g  Status:  Discontinued     3.375 g 12.5 mL/hr over 240 Minutes Intravenous Every 8 hours 06/09/17 1515 06/09/17 1634   06/09/17 1645  piperacillin-tazobactam (ZOSYN) IVPB 2.25 g  Status:  Discontinued     2.25 g 100 mL/hr over 30 Minutes Intravenous Every 8 hours 06/09/17 1634 06/09/17 1729   06/09/17 1515  DAPTOmycin (CUBICIN) 750 mg in sodium chloride 0.9 % IVPB  Status:  Discontinued     750 mg 230 mL/hr over 30 Minutes Intravenous Every 48 hours 06/09/17 1500 06/10/17 0734   05/23/2017 0930  ceFAZolin (ANCEF) IVPB 2g/100 mL premix     2 g 200 mL/hr over 30 Minutes Intravenous On call to O.R. 06/03/17 1214 06/12/2017 1006   06/02/17 2200  linezolid (ZYVOX) IVPB 600 mg  Status:  Discontinued    Comments:  Pharmacy may adjust dose prn   600 mg 300 mL/hr over 60 Minutes Intravenous Every 12 hours 06/02/17 1546 06/08/17 0915   06/02/17 1545  linezolid (ZYVOX) IVPB 600 mg  Status:  Discontinued     600 mg 300 mL/hr over 60 Minutes Intravenous Every 12 hours  06/02/17 1543 06/02/17 1546   05/31/17 1500  vancomycin (VANCOCIN) 1,250 mg in sodium chloride 0.9 % 250 mL IVPB  Status:  Discontinued     1,250 mg 166.7 mL/hr over 90 Minutes Intravenous Every 24 hours 06/08/2017 1409 06/03/17 0845   05/23/2017 2000  piperacillin-tazobactam (ZOSYN) IVPB 3.375 g  Status:  Discontinued     3.375 g 12.5 mL/hr over 240 Minutes Intravenous Every 8 hours 05/26/2017 1409 06/09/17 1515   06/02/2017 1300  vancomycin (VANCOCIN) 2,000 mg in sodium chloride 0.9 % 500 mL IVPB     2,000 mg 250 mL/hr over 120 Minutes Intravenous  Once 06/09/2017 1208 06/07/2017 1657   05/26/2017 1300  piperacillin-tazobactam (ZOSYN) IVPB 3.375 g     3.375 g 100 mL/hr over 30 Minutes Intravenous  Once 06/15/2017 1208 05/19/2017 1334   06/14/2017 0745  ceFAZolin (ANCEF) IVPB 2g/100 mL premix  Status:  Discontinued     2 g 200 mL/hr over 30 Minutes Intravenous On call to O.R. 06/13/2017 0736 06/18/2017 1143   05/20/2017 1800  ceFAZolin (ANCEF) IVPB 2g/100 mL premix  Status:  Discontinued     2 g 200 mL/hr over 30 Minutes Intravenous Every 8 hours 05/20/2017 1220 06/14/2017 1210   06/16/2017 0945  ceFAZolin (ANCEF) IVPB 2g/100 mL premix     2 g 200 mL/hr over 30 Minutes Intravenous To Surgery 05/27/17 2001 06/12/2017 1015   06/01/2017 2000  ceFAZolin (ANCEF) IVPB 2g/100 mL premix     2 g 200 mL/hr over 30 Minutes Intravenous Every 6 hours 06/18/2017 1643 05/24/17 1009   06/16/2017 1015  ceFAZolin (ANCEF) IVPB 2g/100 mL premix     2 g 200 mL/hr over 30 Minutes Intravenous On call to O.R. 06/13/2017 1014 05/27/2017 1400      Assessment/Plan: 65F hx of HFpEF, nec fasciitis of LLE, HLD, pHTN, CKD III, COPD on home O2 (2.5L) - was being managed by ortho for LLE wound and postop developed massive GIB - found to have DU that likely had eroded through GDA -   Bleeding duodenal ulcer and hemorrhagic shock - S/P Exploratory laparotomy with pyloroplasty and oversewing bleeding duodenal ulcer, Dr. Brantley Stage, 10/17 - JP drain output  steady, bilious appearing - CT rec yesterday but family transitioning to comfort care  FEN: NPO, NGT/ TNA VTE: SCD's, no chemical prophylaxis in setting of GIB ID: Zosyn 10/12>>Zyvox  10/15>> - Surgical wound culture 10/10 >> E coli, Pseudomonas, Morganella morganii, proteus, VRE  DISPO: agree with transition to comfort care. I spoke with his family.   LOS: 29 days    Tasha [name] E 07-06-2017

## 2017-06-19 NOTE — Progress Notes (Signed)
PHARMACY - ADULT TOTAL PARENTERAL NUTRITION CONSULT NOTE   Pharmacy Consult:  TPN Indication:  Prolonged NPO status  Patient Measurements: Height: 5' 2.99" (160 cm) Weight: 225 lb 8.5 oz (102.3 kg) IBW/kg (Calculated) : 52.38 TPN AdjBW (KG): 64 Body mass index is 39.96 kg/m.  Assessment:  82 year old female presented on 06/16/2017 with CHF exacerbation, hypotension and elevated troponins.  PMH significant for AFib, dHF, CKD3, GERD, HLD, CAD, and pulmonary HTN.  Patient has gangrenous left thigh/leg and underwent multiple I&Ds.  She required transfer to the ICU after the I&D on 05/19/2017 d/t hypotension from duodenal ulcer bleeding.  Patient underwent ex-lap with pyloroplasty and oversewing of the bleeding ulcer on the same day.  Per charting, patient was on a carb modified diet through 06/03/17.  She has been NPO x6 days with anticipated prolonged NPO status; therefore, pharmacy was consulted to manage TPN.  GI: GIB on IV PPI BID, drains O/P 240 mL, NGT O/P 200 mL, LBM 10/25. Albumin low at 1.1. ?BL prealbumin >60, now at 6.2.  Docusate.  UGI when stable.  Endo: no hx DM - CBGs low-low normal prior TPN, then elevated, now controlled with less CHO in customized TPN  Insulin requirements in the past 24 hours: 1 unit Lytes: wnl exc K down to 2.8 (paged renal), CO2 high at 36 (max Cl in TPN) Phos 2.5, iCa 0.7-1.23 (Ca gluconate through CRRT) Renal: CKD3 w/ AKI. On CRRT since 10/23. BUN normalized - no UOP, net +8L since admit but trending down, D5NS at 10 ml/hr Pulm: intubated 10/17, FiO2 40%.  SBTs. Cards: s/p code blue, MAP 70s, intermittent brady, CVP 5, in Afib - Levophed gtt at 14 AC: Eliquis for Afib held since 9/23 d/t LLE hematoma and massive duodenal bleed - H/H stable, plts improved to 44K Ortho: gangrenous left thigh/leg s/p I&D 10/5, 10/10, 10/12, 10/17.  Needs AKA. Hepatobil: shocked liver - LFTs trended up slightly, tbili up to 1.7 (no jaundice), TG WNL Neuro: PRN Fentanyl - GCS 12,  CPOT 0, RASS at goal 0 ID: Zosyn/Cubicin for infected leg wound.  Low tem on CRRT, WBC down to 14.7 Best Practices: CHG TPN Access: CVC placed 06/05/17 TPN start date: 06/09/17  Nutritional Goals (per RD rec on 10/29): 9675-9163 kCal and 130gm protein per day Fluid: 1.8-2 L *Using weight 114 kg  Current Nutrition:  TPN  Plan:  Continue current TPN bag, but will hold TPN tonight and follow up goals of care / comfort care or need to restart TPN tomorrow  Consider replacing potassium? Paged Renal to make aware  Elenor Quinones, PharmD, BCPS Clinical Pharmacist Pager [phone] 28-Jun-2017 9:04 AM

## 2017-06-19 NOTE — Progress Notes (Signed)
PULMONARY / CRITICAL CARE MEDICINE   Name: [name] MRN: [id number] DOB: Feb 07, 1935    ADMISSION DATE:  05/28/2017 CONSULTATION DATE:  06/14/2017  REFERRING MD:  Dr. Sherral Hammers  CHIEF COMPLAINT:  Acute hypotension  Brief 82 year old female with history of HFpEF, necrotizing fasciitis of left lower leg, HLD, tricuspid regurg, pulmonary hypertension, CKD III, COPD on 2.5L at night presents with hypotension requiring pressor support after repeat left leg wound debridement earlier today (10/17). Patient was in her usual state of health until 9/22 when she presented for traumatic left leg hematoma and hypovolemic shock. She was admitted to the hospital 05/10/17-05/16/2017 and subsequently discharged to SNF where she experienced worsening weakness. She was then readmitted with acute on chronic HFpEF exacerbation on 10/1.  During this hospital stay patient continued to have hemodynamic instability requiring 8 units PRBC over her hospital course. She has been taken back to the OR  On 10/5, 10/10, 10/12, and 10/17 for further wound debridement. Today, after her procedure was completed and wound vac in place, she was in PACU where her blood pressure was noted to drop to 70W systolic. She was given albumin. Neo was initiated. Patient indicated she was dizzy and lightheaded. 3u PRBC were administered. PCCM was called for consult.  Of note patient had grossly bloody stool in the PACU with her hypotensive episode. Bowel movement was described as formed but grossly bloody/in a pool of blood. She denies prior episodes of melena or hematochezia. She endorses being constipated over past few days.   STUDIES:  Renal US 10/22 > no lesions or obstructing stone.  CULTURES: Surgical wound culture 10/10 >> E coli, Pseudomonas, Morganella morganii, proteus, VRE  ANTIBIOTICS: 10/10 Ancef >> 10/12 10/15 Linezolid >> 10/21 10/12 Zosyn >> Daptomycin 10/22 >>  LINES/TUBES: PIV x3 Foley catheter Negative pressure  wound L leg Closed system drain RLQ bulb CVC triple lumen NG/OG  SIGNIFICANT EVENTS: 10/5 Left thigh and left leg debridement 10/10 Repeat irrigation and debridement left thigh and left leg 10/12 Debridement left leg and thigh 10/17 Debridement left thigh wound 10/17 EGD, exploratory laparotomy with bleeding duodenal ulcer/perforated duodenum. Intubated, maxed on 4 pressors in ICU. 10/23- CVVHD 10/26-  A line placed yesterday. Renal recommending fluid off. Ortho planning for AKA. No acute events overnight. 10/27 - palliative care meeting with family. On TPN. Oliguric ATN. Has low phos with refeeding. On levophed and needs increasing. wEaning 10/5 on SBT but very deconditioned and frail.   SUBJECTIVE No events overnight, on morphine drip, NAD  VITAL SIGNS: BP (!) 94/47   Pulse 80   Temp (!) 97.3 F (36.3 C) (Axillary)   Resp 20   Ht 5' 2.99" (1.6 m)   Wt 102.3 kg (225 lb 8.5 oz)   SpO2 99%   BMI 39.96 kg/m   HEMODYNAMICS: CVP:  [5 mmHg-7 mmHg] 5 mmHg  VENTILATOR SETTINGS: Vent Mode: PRVC FiO2 (%):  [40 %] 40 % Set Rate:  [16 bmp] 16 bmp Vt Set:  [330 mL] 330 mL PEEP:  [5 cmH20] 5 cmH20 Pressure Support:  [10 cmH20] 10 cmH20 Plateau Pressure:  [14 cmH20-15 cmH20] 14 cmH20  INTAKE / OUTPUT: I/O last 3 completed shifts: In: 5570.2 [I.V.:4989.2; NG/GT:30; IV Piggyback:551] Out: 8590 [Emesis/NG output:300; Drains:500; Other:7790]  PHYSICAL EXAMINATION: EYE: no conjunctival injection or pallor, EOMI ENMT: ETT RESPIRATORY: +coarse breath sounds anteriorly, no W/R/R CV: RRR, no m/r/g, +generalized edema GI: +wound vac in place, bandage clean and dry, abdomen mildly distended, JP drain with  bilious fluid SKIN: LLE bandage in place clean and dry, wound vac in place NEURO: alert, following commands  LABS:  PULMONARY  Recent Labs Lab 06/14/17 2250 06/15/17 0347 06/15/17 1631  06/15/17 2320 06/15/17 2326 2017-07-05 0420 2017-07-05 0431 2017/07/05 0812 07-05-17 0817   PHART 7.344* 7.337* 7.340*  --  7.368  --  7.344*  --   --   --   PCO2ART 63.0* 64.2* 73.7*  --  62.9*  --  69.8*  --   --   --   PO2ART 130.0* 139.0* 144.0*  --  109.0*  --  102.0  --   --   --   HCO3 34.6* 34.8* 40.2*  < > 36.7* 36.2* 38.6* 37.3* 37.7* 33.2*  TCO2 37* 37* 43*  < > 39* 39* 41* 40* 40* 35*  O2SAT 99.0 99.0 99.0  < > 98.0 37.0 98.0 49.0 57.0 62.0  < > = values in this interval not displayed.  CBC  Recent Labs Lab 06/15/17 0341  06/16/17 0355 2017/07/05 0405  07/05/2017 0431 05-Jul-2017 0812 07/05/2017 0817  HGB 11.3*  < > 11.5* 11.4*  < > 12.6 12.2 10.9*  HCT 34.6*  < > 35.9* 35.5*  < > 37.0 36.0 32.0*  WBC 11.7*  --  14.7* 15.1*  --   --   --   --   PLT 29*  --  44* 39*  --   --   --   --   < > = values in this interval not displayed.  COAGULATION No results for input(s): INR in the last 168 hours.  CARDIAC  No results for input(s): TROPONINI in the last 168 hours. No results for input(s): PROBNP in the last 168 hours.   CHEMISTRY  Recent Labs Lab 06/12/17 0423  06/13/17 5643  06/14/17 3295  06/15/17 0341  06/15/17 1600  06/16/17 0355 06/16/17 1715 2017/07/05 0405 07/05/2017 0420 2017-07-05 0431 2017/07/05 0812 2017/07/05 0817  NA 138  < > 136  < > 136  < > 136  < > 134*  < > 138 138 138 138 140 140 141  K 3.6  < > 3.4*  < > 3.3*  < > 3.4*  < > 3.9  < > 3.6 3.3* 3.2* 3.3* 3.2* 3.2* 2.8*  CL 96*  < > 96*  < > 96*  < > 96*  --  93*  --  95* 94* 95*  --   --   --   --   CO2 32  < > 33*  < > 32  < > 34*  --  33*  --  36* 36* 36*  --   --   --   --   GLUCOSE 131*  < > 127*  < > 113*  < > 133*  --  115*  --  108* 123* 111*  --   --   --   --   BUN 15  < > 13  < > 9  < > 8  --  11  --  13 15 16   --   --   --   --   CREATININE 0.83  < > 0.71  < > 0.50  < > 0.49  --  0.55  --  0.57 0.60 0.56  --   --   --   --   CALCIUM 9.2  < > 10.2  < > 9.7  < > 9.4  --  9.4  --  10.1 9.9  10.1  --   --   --   --   MG 2.0  --  1.8  --  1.7  --  1.9  --   --   --  1.8  --   --   --   --    --   --   PHOS 1.2*  < > 1.6*  < > 1.8*  < > 1.3*  --  2.5  --  2.1* 2.5 2.5  --   --   --   --   < > = values in this interval not displayed. Estimated Creatinine Clearance: 64.1 mL/min (by C-G formula based on SCr of 0.56 mg/dL).  LIVER  Recent Labs Lab 06/12/17 0423  06/15/17 0341 06/15/17 1600 06/16/17 0355 06/16/17 1715 06/26/2017 0405  AST 27  --   --   --  48*  --   --   ALT 9*  --   --   --  19  --   --   ALKPHOS 138*  --   --   --  229*  --   --   BILITOT 1.4*  --   --   --  1.7*  --   --   PROT 4.7*  --   --   --  4.7*  --   --   ALBUMIN 1.3*  < > 1.1* 1.1* 1.1* 1.0* 1.1*  < > = values in this interval not displayed.  INFECTIOUS No results for input(s): LATICACIDVEN, PROCALCITON in the last 168 hours.  ENDOCRINE CBG (last 3)   Recent Labs  06/16/17 0837 06/16/17 2048 06/26/17 0805  GLUCAP 127* 119* 96   No results found. DISCUSSION: 82 year old female with history of HFpEF, necrotizing fasciitis of left lower leg, HLD, tricuspid regurg, pulmonary hypertension, CKD III, COPD on 2L at night, presents after repeat left lower extremity debridement with acute hypotension. Subsequently found to have duodenal ulcer with perforation, in hemorrhagic shock. Maxed on 4 pressors in ICU, intubated.  ASSESSMENT / PLAN:  GASTROINTESTINAL A:   Acute GI bleed 2/2 duodenal ulcer with hemorrhagic shock - s/p ex lap with repair 10/17 Shock liver; ALT 389, AST 2448 Nutrition  - NPO on TPN continues 06-26-17 P:   D/C TPN  PULMONARY A: Pulmonary Hypertension - RHC 11/2015 with PAH 59/17 mmHg Mechanically ventilated - acute resop failure 06/26/2017 - > does NOT  meet criteria for Extubation in setting of Acute Respiratory Failure due to  MODS and deconditioning and volume overload th ough doing SBT P:   Extubate terminally in AM if still alive after levophed off.  CARDIOVASCULAR A:  HFpEF (Last echo 05/21/2017 indicates EF 60-65%, moderate MR, mod pulm HTN) with  intermittent episodes of hypotension. Hypovolemic shock requiring massive blood transfusion Paroxysmal Atrial fibrillation CAD Elevated troponin  06-26-2017 - ongoing circulatory shock P:  D/C levophed  RENAL A:   AKI on CKD stage III (baseline Cr 0.8-1.1), worsening.  Continues to have anuria Hypokalemia Hypocalcemia  - ON CRRT. Lytes repletd by renal P:   D/C further blood draws  HEMATOLOGIC  Recent Labs Lab 06/15/17 0341  06/16/17 0355 Jun 26, 2017 0405  June 26, 2017 0431 Jun 26, 2017 0812 Jun 26, 2017 0817  HGB 11.3*  < > 11.5* 11.4*  < > 12.6 12.2 10.9*  HCT 34.6*  < > 35.9* 35.5*  < > 37.0 36.0 32.0*  WBC 11.7*  --  14.7* 15.1*  --   --   --   --  PLT 29*  --  44* 39*  --   --   --   --   < > = values in this interval not displayed.  A:   Hemorrhagic shock requiring massive blood transfusions  Acute blood loss anemia - due to above - improved Thrombocytopenia - chronic low in the hospital  P:  D/C CBC and transfusion  INFECTIOUS A:   Surgical wound culture 10/10 >> E coli, Pseudomonas, Morganella morganii, proteus, VRE Massive wound necrosis of LLE, will require AKA P:   D/C abx  ENDOCRINE A:   Hypoglycemia - resolved P:  D/C CBGs  NEUROLOGIC A:   Remains neurologically intact, no acute process  P:   Morphine drip for comfort  FAMILY  - Spoke with family, patient does not wish for this level of care and would wish for comfort care.  Will d/c every thing today except for ETT, if patient is still alive in AM then will terminally extubate.  The patient is critically ill with multiple organ systems failure and requires high complexity decision making for assessment and support, frequent evaluation and titration of therapies, application of advanced monitoring technologies and extensive interpretation of multiple databases.   Critical Care Time devoted to patient care services described in this note is  35  Minutes. This time reflects time of care of this signee Dr  Jennet Maduro. This critical care time does not reflect procedure time, or teaching time or supervisory time of PA/NP/Med student/Med Resident etc but could involve care discussion time.  Rush Farmer, M.D. [facility] Pulmonary/Critical Care Medicine. Pager: [phone]. After hours pager: [phone].

## 2017-06-19 NOTE — Consult Note (Signed)
   [facility] CM Inpatient Consult   2017/07/01  Wilcox 02-18-1935 [id number]    [facility] Care Management follow up.  Chart reviewed. Noted [name] has expired.  Spoke with inpatient RNCM.  Will alert [place] team.   Marthenia Rolling, [place], RN,BSN [facility] Liaison [phone]

## 2017-06-19 NOTE — Progress Notes (Signed)
Patient Death Note:  Pupils fixed and dilated, heart tones and respirations absent, no pulses palpable. Verified by Izora Gala. Family notified and at bedside. MD notified. Chaplain paged. Time of death 68.   169mL Morphine wasted in sink witnessed by Damita Dunnings, RN.

## 2017-06-19 NOTE — Procedures (Signed)
Extubation Procedure Note  Patient Details:   Name: [name] DOB: [birth year] MRN: [id number]   Airway Documentation:     Evaluation  O2 sats: currently acceptable Complications: No apparent complications Patient did tolerate procedure well. Bilateral Breath Sounds: Clear, Diminished   No  Pt extubated per comfort care order.   Ander Purpura 2017-07-13, 12:25 PM

## 2017-06-19 NOTE — Progress Notes (Signed)
S: [name] is minimally responsive today, she does not appear to be in pain. We spoke with her husband and daughter at bedside, they are in agreement with the plan to transition to comfort care and agree with stopping CRRT at this time.   O:BP (!) 94/47   Pulse 80   Temp (!) 97.3 F (36.3 C) (Axillary)   Resp 20   Ht 5' 2.99" (1.6 m)   Wt 225 lb 8.5 oz (102.3 kg)   SpO2 99%   BMI 39.96 kg/m   Intake/Output Summary (Last 24 hours) at 03-Jul-2017 1053 Last data filed at 2017-07-03 1000  Gross per 24 hour  Intake          3996.35 ml  Output             5902 ml  Net         -1905.65 ml   Intake/Output: I/O last 3 completed shifts: In: 5570.2 [I.V.:4989.2; NG/GT:30; IV Piggyback:551] Out: 8590 [Emesis/NG output:300; Drains:500; Other:7790]  Intake/Output this shift:  Total I/O In: 536.6 [I.V.:466.6; NG/GT:20; IV Piggyback:50] Out: 732 [Drains:25; Other:707] Weight change: -2 lb 6.8 oz (-1.1 kg) Gen: Sleeping, no acute distress Resp: ETT, no increased WOB  Abdomen: wound vac in place draining serosanguinous fluid    Recent Labs Lab 06/12/17 0423  06/14/17 1918  06/14/17 2242  06/15/17 0341  06/15/17 1600  06/16/17 0355 06/16/17 1715 July 03, 2017 0405 03-Jul-2017 0420 07/03/17 0431 07/03/17 0812 Jul 03, 2017 0817  NA 138  < > 139  < > 135  < > 136  < > 134*  < > 138 138 138 138 140 140 141  K 3.6  < > 2.4*  < > 3.7  < > 3.4*  < > 3.9  < > 3.6 3.3* 3.2* 3.3* 3.2* 3.2* 2.8*  CL 96*  < > 109  --  97*  --  96*  --  93*  --  95* 94* 95*  --   --   --   --   CO2 32  < > 25  --  32  --  34*  --  33*  --  36* 36* 36*  --   --   --   --   GLUCOSE 131*  < > 104*  --  136*  --  133*  --  115*  --  108* 123* 111*  --   --   --   --   BUN 15  < > 6  --  7  --  8  --  11  --  _0 --   --   --   --   CREATININE 0.83  < > 0.34*  --  0.48  --  0.49  --  0.55  --  0.57 0.60 0.56  --   --   --   --   ALBUMIN 1.3*  < > <1.0*  --  1.2*  --  1.1*  --  1.1*  --  1.1* 1.0* 1.1*  --   --   --   --    CALCIUM 9.2  < > 6.8*  --  9.3  --  9.4  --  9.4  --  10.1 9.9 10.1  --   --   --   --   PHOS 1.2*  < > 1.2*  --  1.4*  --  1.3*  --  2.5  --  2.1* 2.5 2.5  --   --   --   --  AST 27  --   --   --   --   --   --   --   --   --  48*  --   --   --   --   --   --   ALT 9*  --   --   --   --   --   --   --   --   --  19  --   --   --   --   --   --   < > = values in this interval not displayed. Liver Function Tests:  Recent Labs Lab 06/12/17 0423  06/16/17 0355 06/16/17 1715 2017-06-28 0405  AST 27  --  48*  --   --   ALT 9*  --  19  --   --   ALKPHOS 138*  --  229*  --   --   BILITOT 1.4*  --  1.7*  --   --   PROT 4.7*  --  4.7*  --   --   ALBUMIN 1.3*  < > 1.1* 1.0* 1.1*  < > = values in this interval not displayed. No results for input(s): LIPASE, AMYLASE in the last 168 hours. No results for input(s): AMMONIA in the last 168 hours. CBC:  Recent Labs Lab 06/13/17 0438  06/14/17 0312  06/15/17 0341  06/16/17 0355 2017/06/28 0405  06/28/17 0431 06/28/2017 0812 June 28, 2017 0817  WBC 9.1  --  10.5  --  11.7*  --  14.7* 15.1*  --   --   --   --   NEUTROABS  --   --   --   --   --   --  12.7*  --   --   --   --   --   HGB 11.8*  < > 11.6*  < > 11.3*  < > 11.5* 11.4*  < > 12.6 12.2 10.9*  HCT 36.7  < > 35.6*  < > 34.6*  < > 35.9* 35.5*  < > 37.0 36.0 32.0*  MCV 91.1  --  90.8  --  90.8  --  92.3 92.7  --   --   --   --   PLT 23*  --  24*  --  29*  --  44* 39*  --   --   --   --   < > = values in this interval not displayed. Cardiac Enzymes:  Recent Labs Lab 06/13/17 0438 06/16/17 0355  CKTOTAL 15* 20*   CBG:  Recent Labs Lab 06/15/17 2321 06/16/17 0403 06/16/17 0837 06/16/17 2048 2017-06-28 0805  GLUCAP 113* 107* 127* 119* 96    Iron Studies: No results for input(s): IRON, TIBC, TRANSFERRIN, FERRITIN in the last 72 hours. Studies/Results: No results found. . brimonidine  1 drop Right Eye BID  . brinzolamide  1 drop Both Eyes BID  . chlorhexidine gluconate (MEDLINE  KIT)  15 mL Mouth Rinse BID  . Chlorhexidine Gluconate Cloth  6 each Topical Daily  . docusate  100 mg Oral BID  . Gerhardt's butt cream   Topical BID  . insulin aspart  0-9 Units Subcutaneous Q4H  . latanoprost  1 drop Right Eye QHS  . mouth rinse  15 mL Mouth Rinse QID  . pantoprazole (PROTONIX) IV  40 mg Intravenous Q12H  . sodium chloride flush  3 mL Intravenous Q12H  . timolol  1 drop Both Eyes  BID    BMET    Component Value Date/Time   NA 141 2017-06-19 0817   K 2.8 (L) 06-19-2017 0817   CL 95 (L) 2017/06/19 0405   CO2 36 (H) 2017/06/19 0405   GLUCOSE 111 (H) 06/19/17 0405   BUN 16 06/19/2017 0405   CREATININE 0.56 19-Jun-2017 0405   CREATININE 1.10 (H) 01/31/2016 1118   CALCIUM 10.1 Jun 19, 2017 0405   GFRNONAA >60 June 19, 2017 0405   GFRAA >60 19-Jun-2017 0405   CBC    Component Value Date/Time   WBC 15.1 (H) 2017-06-19 0405   RBC 3.83 (L) 19-Jun-2017 0405   HGB 10.9 (L) 19-Jun-2017 0817   HCT 32.0 (L) 06-19-17 0817   PLT 39 (L) June 19, 2017 0405   MCV 92.7 2017/06/19 0405   MCH 29.8 2017-06-19 0405   MCHC 32.1 2017-06-19 0405   RDW 18.0 (H) 06-19-17 0405   LYMPHSABS 1.2 06/16/2017 0355   MONOABS 0.7 06/16/2017 0355   EOSABS 0.1 06/16/2017 0355   BASOSABS 0.0 06/16/2017 0355     Assessment/Plan:  1. AKI 2/2 hemorrhagic shock - CRRT has pulled volume off overnight, sadly [name] hemodynamic status has continued to decline and she is requiring more levophed at this time with less of a BP response. We will stop CRRT at this time and continue with comfort measures. Singing off at this time, please feel free to reach out if there is any way that nephrology can assist moving forward.   Ledell Noss, PGY 2

## 2017-06-19 DEATH — deceased

## 2017-06-20 ENCOUNTER — Telehealth: Payer: Self-pay | Admitting: Emergency Medicine

## 2017-06-20 NOTE — Telephone Encounter (Signed)
Spoke with patient's husband. Offered my condolences. Advised patient to contact his local social services dept to see if they had any patient outreach programs that could take the unused medication.   He verbalized understanding. Nothing else needed at time of call.

## 2017-06-24 ENCOUNTER — Telehealth: Payer: Self-pay

## 2017-06-24 NOTE — Telephone Encounter (Signed)
On 06/24/17 I received a d/c from [facility] (original). The d/c is for burial. The patient is a patient of Doctor Nelda Marseille. The d/c will be taken to [facility] (Monday [id number]) for signature when Nelda Marseille comes back from [place].  On 06/30/2017 I received the d/c back from Doctor Nelda Marseille. I got the d/c ready and called the funeral home to let them know that Blanch Media with [employer] will be by to pickup up the d/c tomorrow am.

## 2017-07-07 ENCOUNTER — Ambulatory Visit: Payer: Self-pay | Admitting: Licensed Clinical Social Worker

## 2017-07-19 NOTE — Discharge Summary (Signed)
NAMECHETARA, KROPP           ACCOUNT NO.:  [id number]  MEDICAL RECORD NO.:  [id number]  LOCATION:  2M10C                        FACILITY:  [facility]  PHYSICIAN:  Providence Lanius, MD  DATE OF BIRTH:  Nov 12, 1934  DATE OF ADMISSION:  06/11/2017 DATE OF DISCHARGE:                              DISCHARGE SUMMARY   PRIMARY DIAGNOSIS AND CAUSE OF DEATH:  Hemorrhagic shock.  SECONDARY DIAGNOSES:  Duodenal ulcer, upper GI bleed, transaminitis, acute hypoxemic respiratory failure, pulmonary severe hypertension, hypovolemic shock, paroxysmal atrial fibrillation, coronary artery disease, acute kidney injury, hypokalemia, hypocalcemia, acute blood loss anemia, thrombocytopenia.  Left lower extremity wound necrosis, hypoglycemia.  The patient is an 82 year old female with extensive past medical history, presents to the hospital with upper GI bleed and has history of hemorrhagic shock.  The patient was admitted to the hospital with several pressors, suffered significant ischemia to her extremity. Aftercare in the ICU for some time.  We were approached by the family requesting of the conversation who had an extensive conversation about the patient's wishes, and the extent of her disease.  After discussion, decision was made that the patient would not want this level of care and she has very little chance of surviving and having a relatively acceptable quality of life, at which point we recommended morphine that was started and it was left to the family's discretion when to extubate. Morphine was started.  The patient was extubated at family's request, expired shortly thereafter.     Providence Lanius, MD     WJY/MEDQ  D:  06/20/2017  T:  06/21/2017  Job:  [id number]

## 2018-01-29 IMAGING — US US RENAL
1 series · 14 of 25 positions shown · non-contrast
Comparison: PET scan 04/09/2014.

CLINICAL DATA: Acute kidney insufficiency.

EXAM:
RENAL / URINARY TRACT ULTRASOUND COMPLETE

[Series 1: us renal · 0.36mm/px · 14 of 28 slices shown]
[im 1/28]
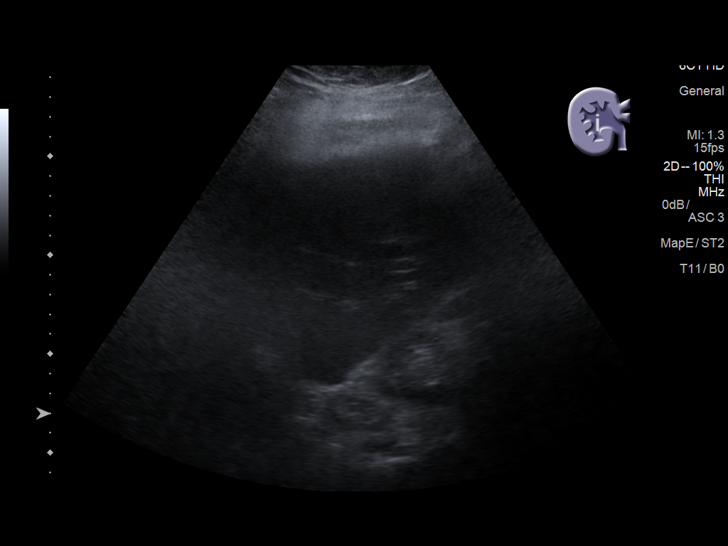
[im 3/28]
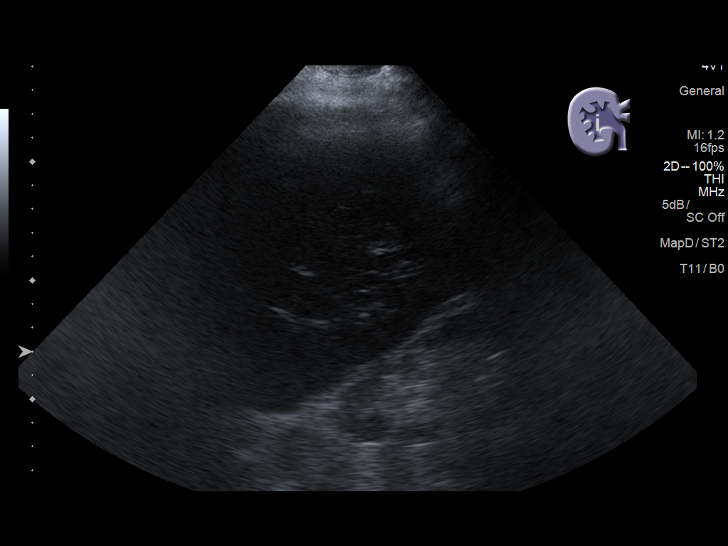
[im 5/28]
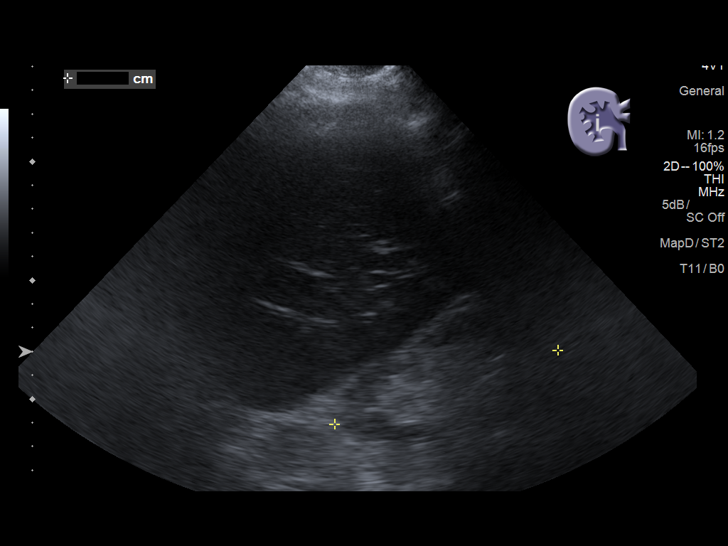
[im 7/28]
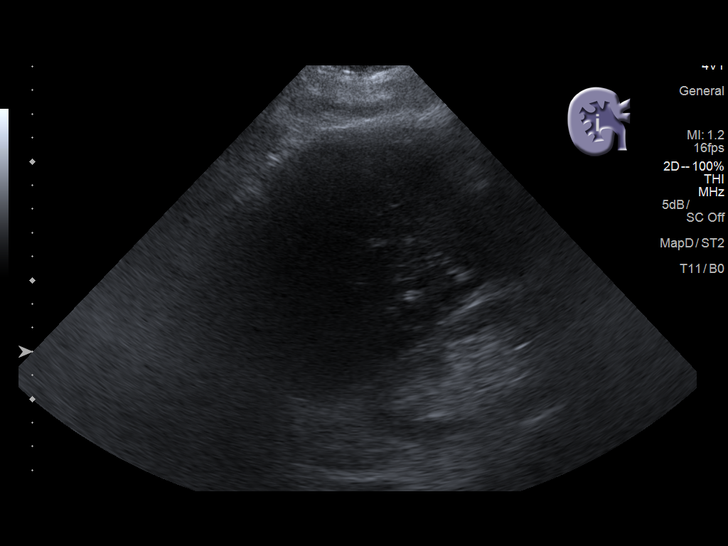
[im 10/28]
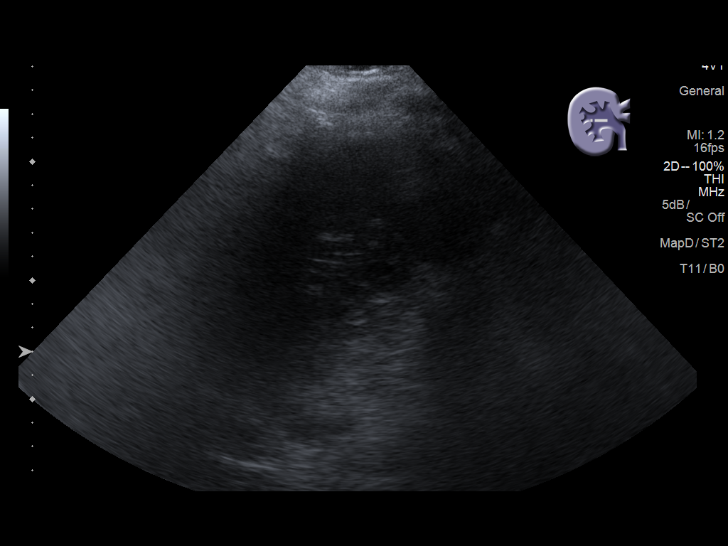
[im 11/28]
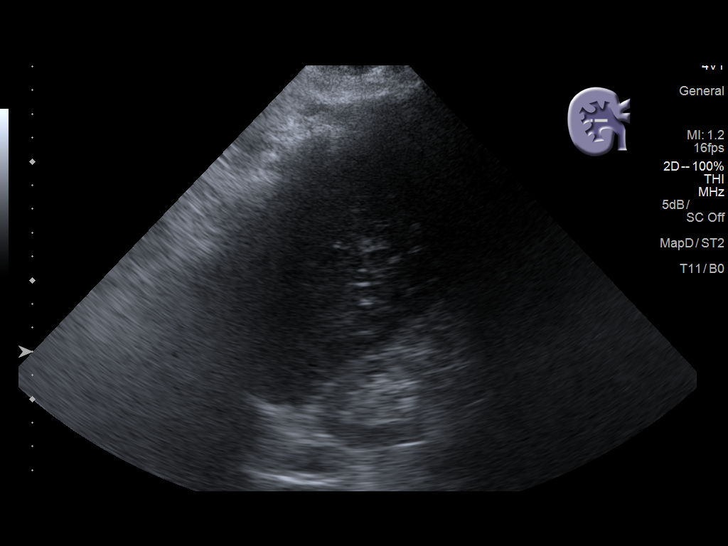
[im 13/28]
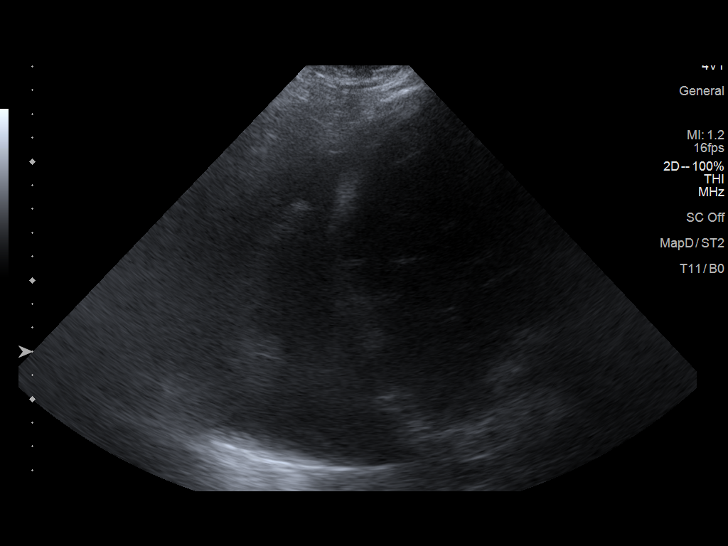
[im 15/28]
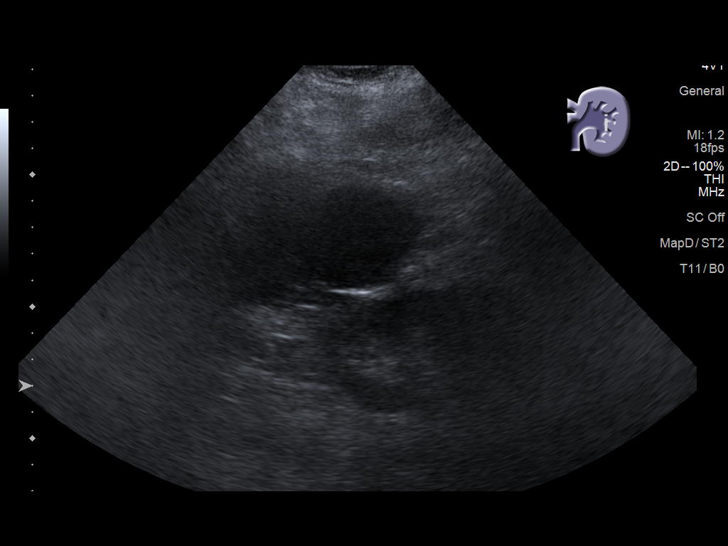
[im 17/28]
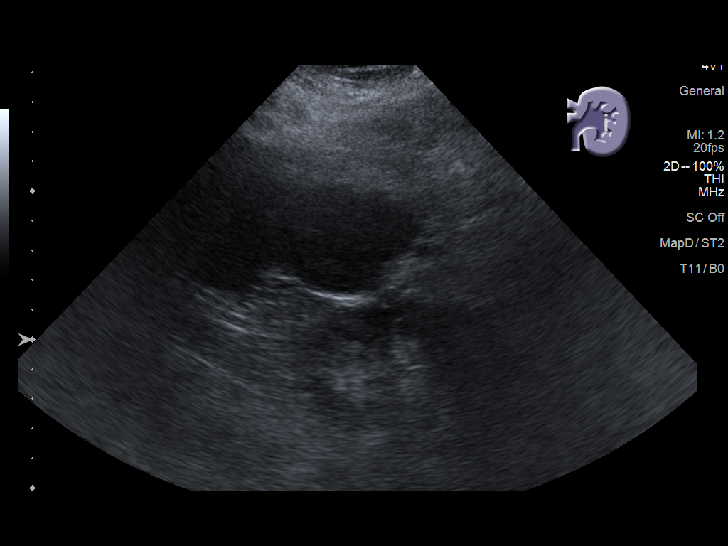
[im 19/28]
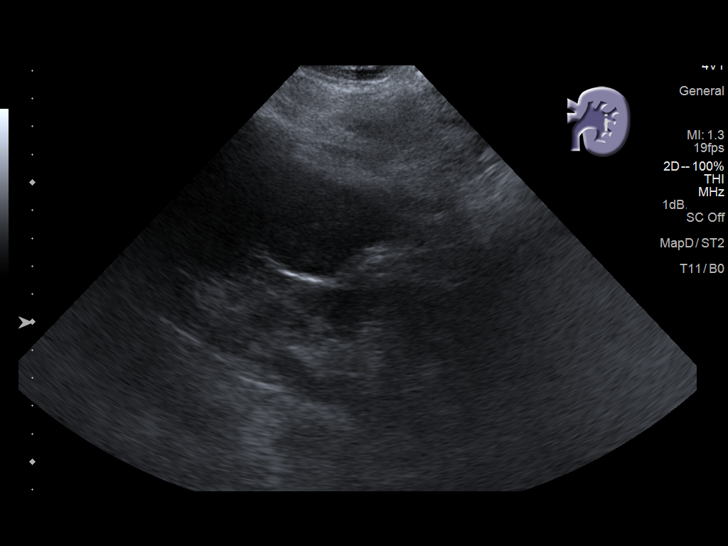
[im 21/28]
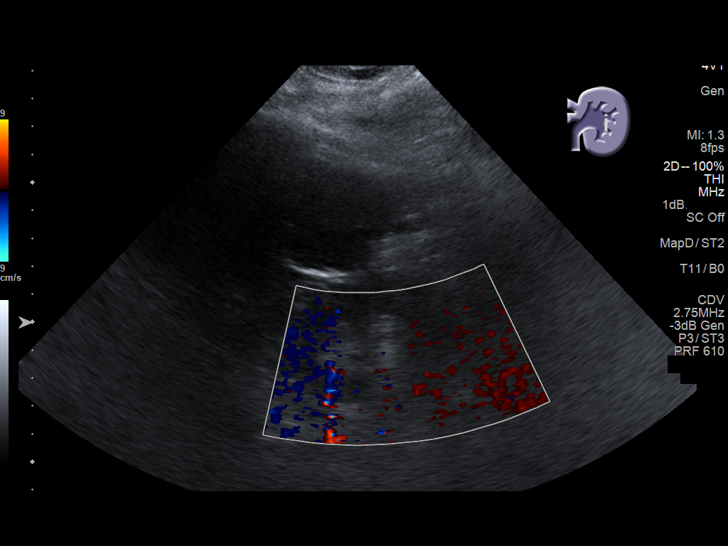
[im 23/28]
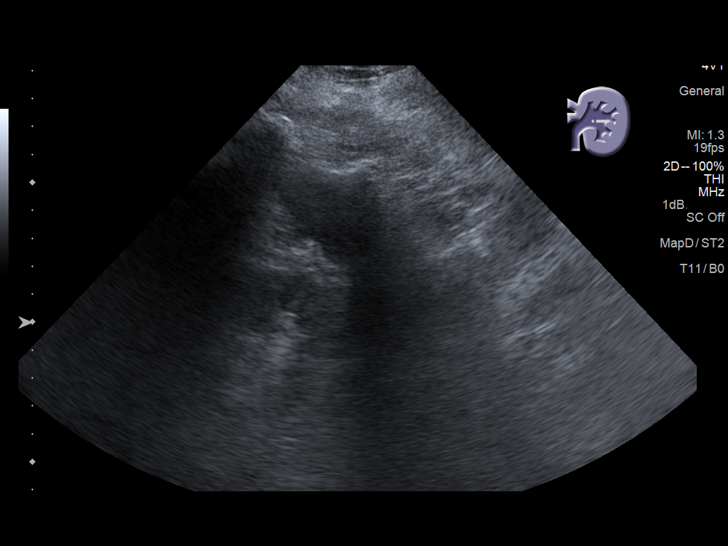
[im 25/28]
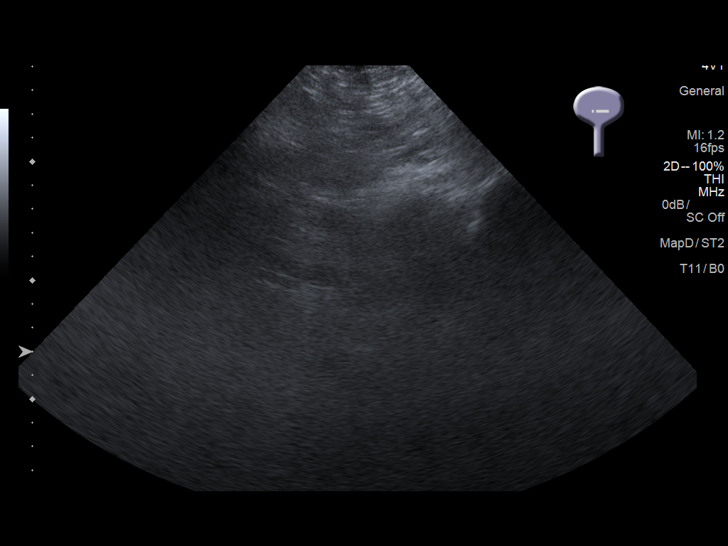
[im 28/28]
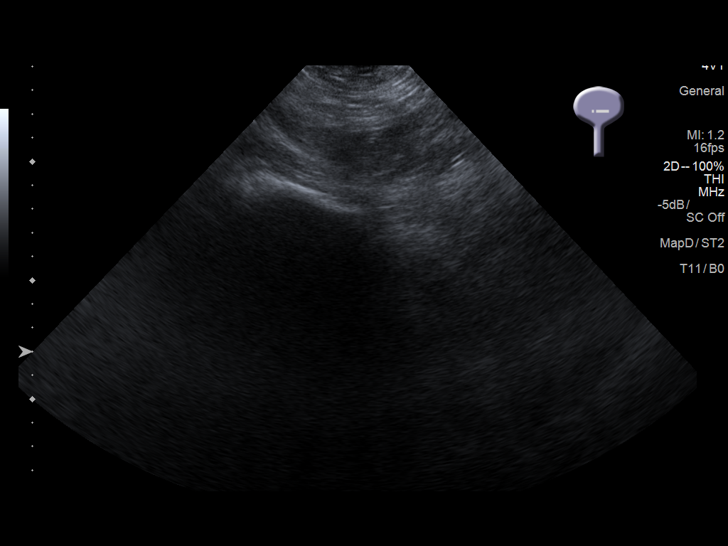

[14 of 25 positions shown; findings below may reference images not displayed]

FINDINGS: Right Kidney:

Length: 9.9 cm, within normal limits. Renal parenchyma is thinned
and hyperechoic relative to the index organ, the liver. There is no
mass or stone. No obstructing lesion is present.

Left Kidney:

Length: 9.1 cm, within normal limits. Renal parenchyma is thinned
and hyperechoic relative to the index organ, the spleen. There is no
mass or stone. No obstructing lesion is present.

Bladder:

A Foley catheter is in place.

Bilateral pleural effusions are incidentally noted.
IMPRESSION: 1. Thin hyperechoic kidneys bilaterally without focal mass lesion or
obstructing stone.
2. The finding is nonspecific, but can be seen in the setting of
medical renal disease.

## 2018-02-01 IMAGING — DX DG CHEST 1V PORT
1 series · 1 of 1 positions shown · non-contrast
Comparison: 06/08/2017.

CLINICAL DATA: 82-year-old female with acute respiratory failure.

EXAM:
PORTABLE CHEST 1 VIEW

[chest ap]
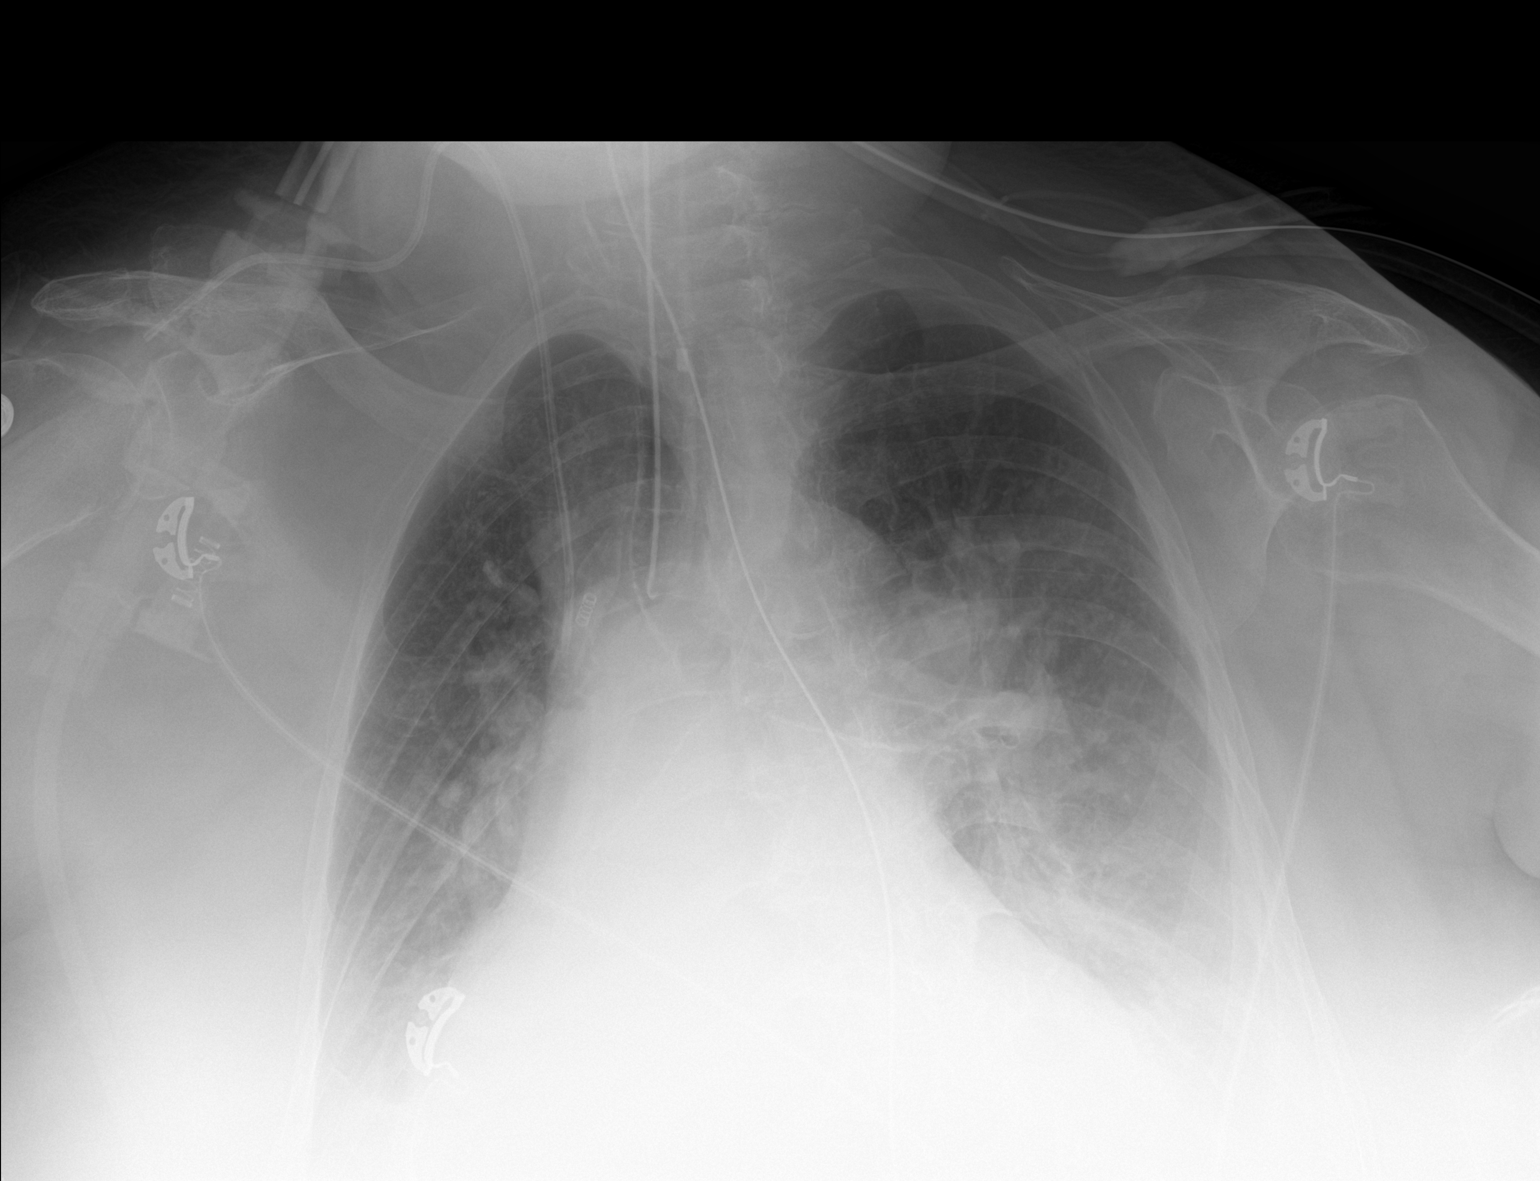

[1 of 1 positions shown; findings below may reference images not displayed]

FINDINGS: Endotracheal tube tip 3 cm above the carina with slight angulation
of the trachea to the right unchanged.

Right central line tip proximal superior vena cava level.

Nasogastric tube courses below the diaphragm. Tip is not included on
the present exam.

Cardiomegaly.

Pulmonary vascular congestion/ mild pulmonary edema with bilateral
pleural effusions suspected.

Bibasilar atelectasis.  Basilar infiltrates secondary consideration.

No pneumothorax.
IMPRESSION: Overall no significant change.

Pulmonary edema with bilateral pleural effusions.

Cardiomegaly.

Bibasilar atelectasis suspected. Basilar infiltrates secondary
consideration.

## 2018-02-02 IMAGING — DX DG CHEST 1V PORT
1 series · 1 of 1 positions shown · non-contrast
Comparison: Study obtained earlier in the day

CLINICAL DATA: Catheter insertion.  Hypoxia.

EXAM:
PORTABLE CHEST 1 VIEW

[chest ap]
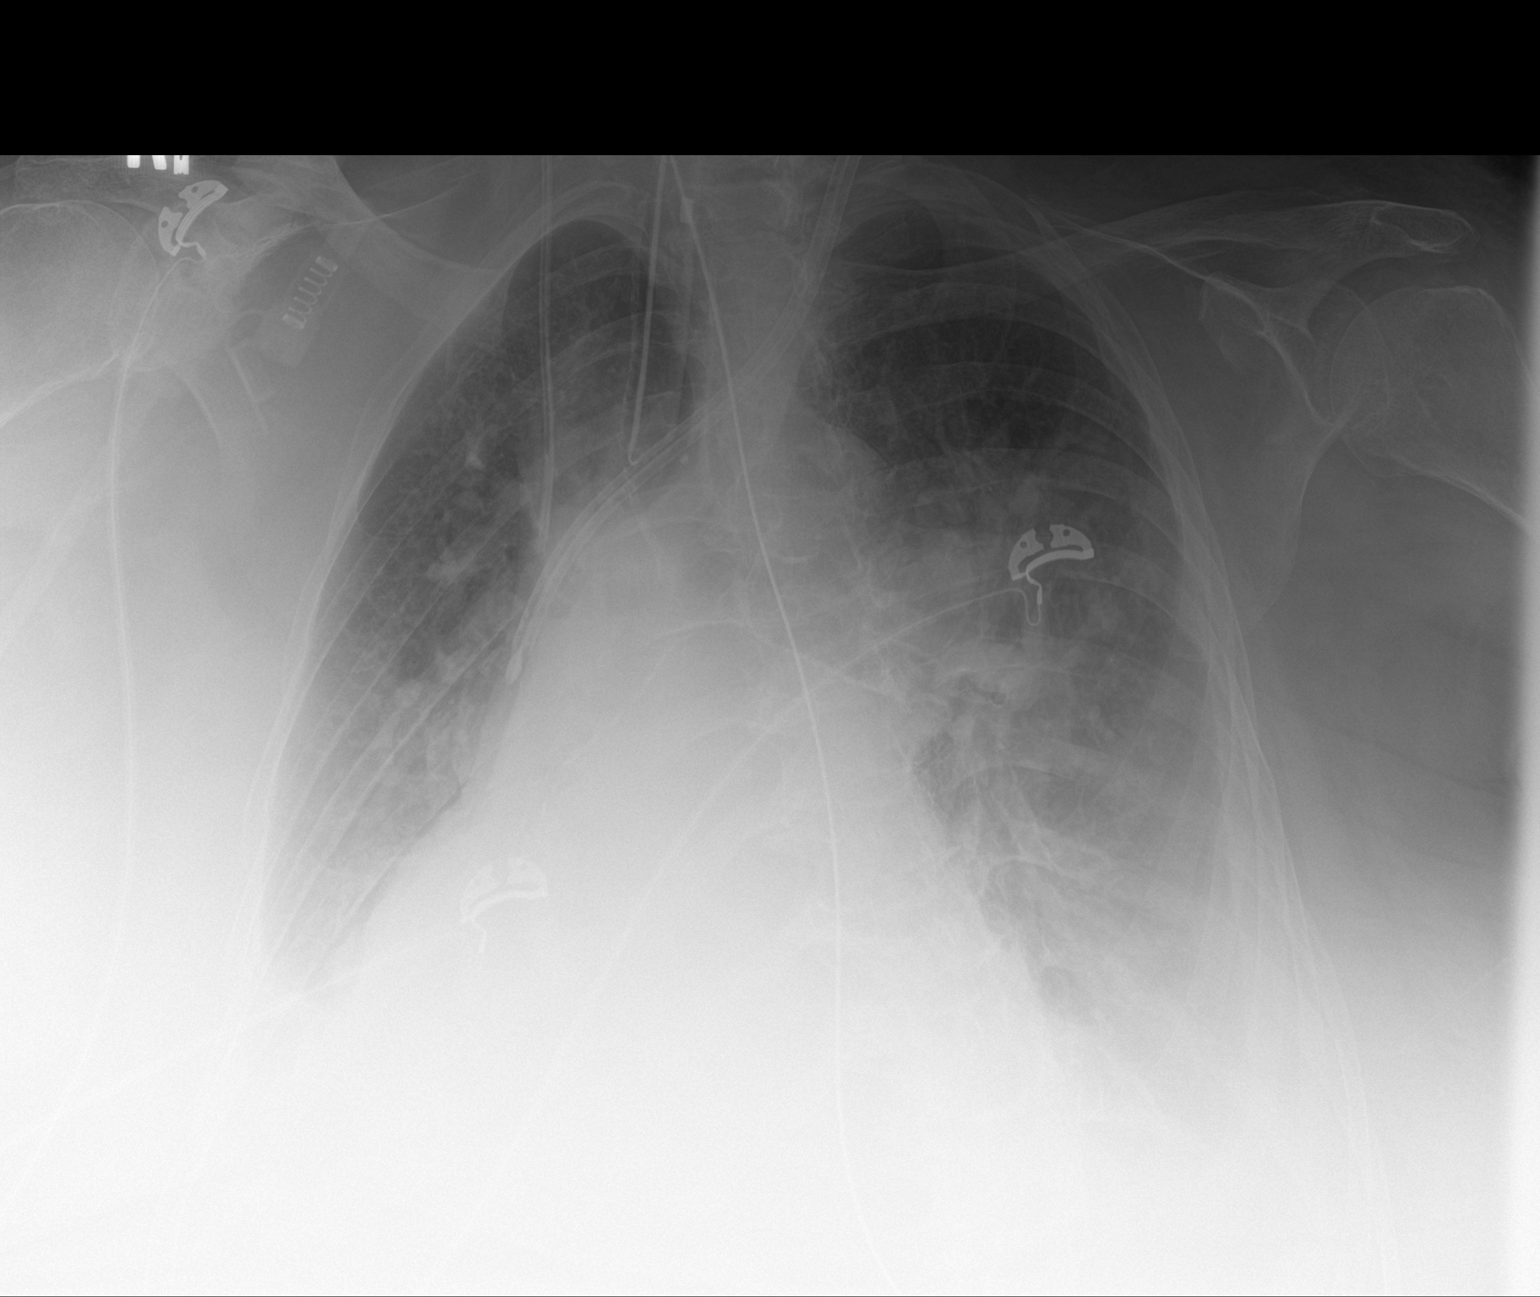

[1 of 1 positions shown; findings below may reference images not displayed]

FINDINGS: Dialysis catheter tip is in the superior vena cava. Right jugular
catheter tip is in the superior vena cava. Endotracheal tube tip is
3.6 cm above the carina. Nasogastric tube tip and side port are
below the diaphragm. No pneumothorax.

There are bilateral pleural effusions. There is interstitial and
alveolar edema, stable. There is airspace consolidation in the right
lower lobe with volume loss. There is also perihilar airspace
opacity.

There is cardiomegaly with pulmonary venous hypertension. No evident
adenopathy. No bone lesions. There is aortic atherosclerosis.
IMPRESSION: Dialysis catheter tip in superior vena cava. Other tubes and
catheters as described. No pneumothorax. Cardiomegaly with pulmonary
venous hypertension. Pulmonary edema and pleural effusions remain.
Airspace consolidation right lower lobe and left perihilar regions
raise concern for superimposed pneumonia. The appearance of the
lungs and cardiac silhouette is stable compared to earlier in the
day. There is aortic atherosclerosis.

Aortic Atherosclerosis ([id number]).

## 2018-02-02 IMAGING — CR DG CHEST 1V PORT
1 series · 1 of 1 positions shown · non-contrast
Comparison: 06/09/2017.

CLINICAL DATA: Respiratory failure.

EXAM:
PORTABLE CHEST 1 VIEW

[AP]
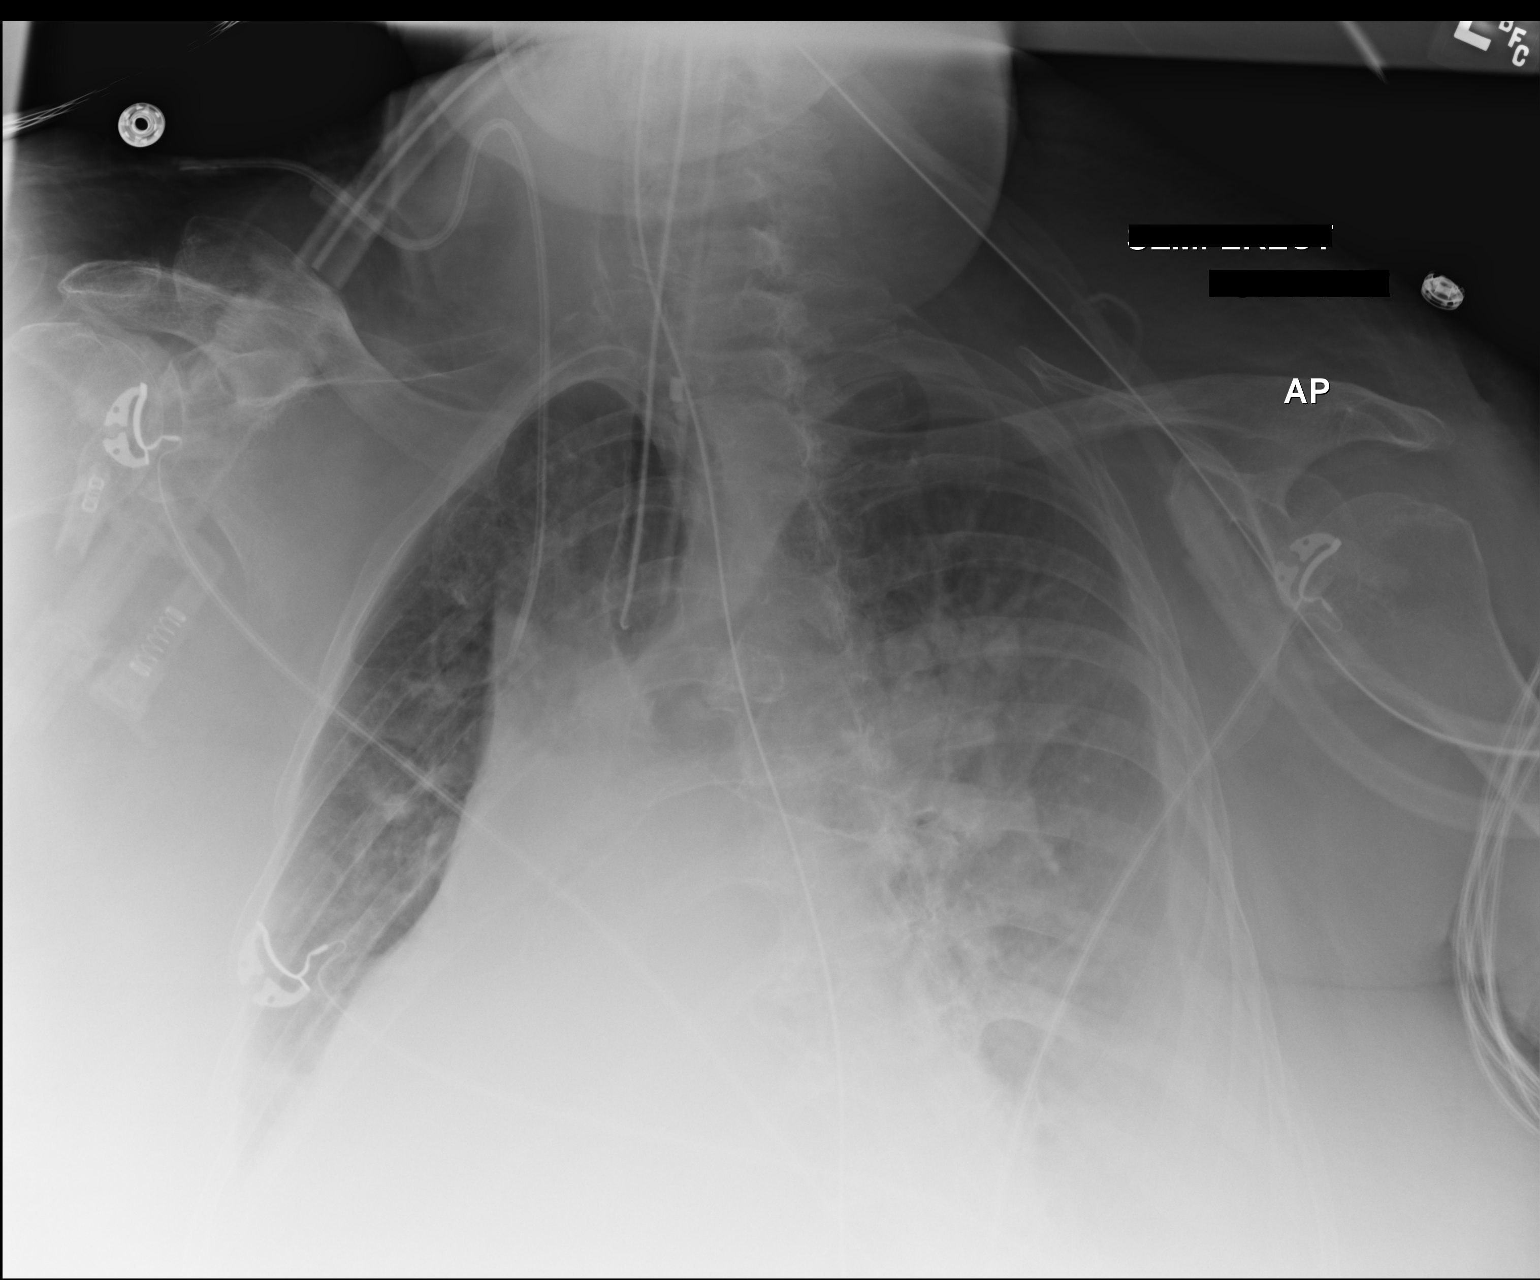

[1 of 1 positions shown; findings below may reference images not displayed]

FINDINGS: Endotracheal tube and right IJ line stable position. NG tube nodes
its tip below the lower image margin. The hemidiaphragms are
incompletely imaged. Cardiomegaly again noted. Pulmonary vascular
congestion interstitial prominence with bilateral pleural effusions,
left side greater than right again noted. Findings suggest
congestive heart failure. Dense right lower lobe atelectasis and
consolidation again noted.
IMPRESSION: 1.  Lines and tubes in stable position.

2. Findings suggesting congestive heart failure with pulmonary
pulmonary edema bilateral pleural effusions, left side greater than
right. Findings have progressed from prior exam.

3. Persistent dense right lower lobe atelectasis and consolidation.

## 2018-02-07 IMAGING — CR DG CHEST 1V PORT
1 series · 1 of 1 positions shown · non-contrast
Comparison: 06/14/2017

CLINICAL DATA: ETT present, CKD, hypotension

EXAM:
PORTABLE CHEST 1 VIEW

[AP]
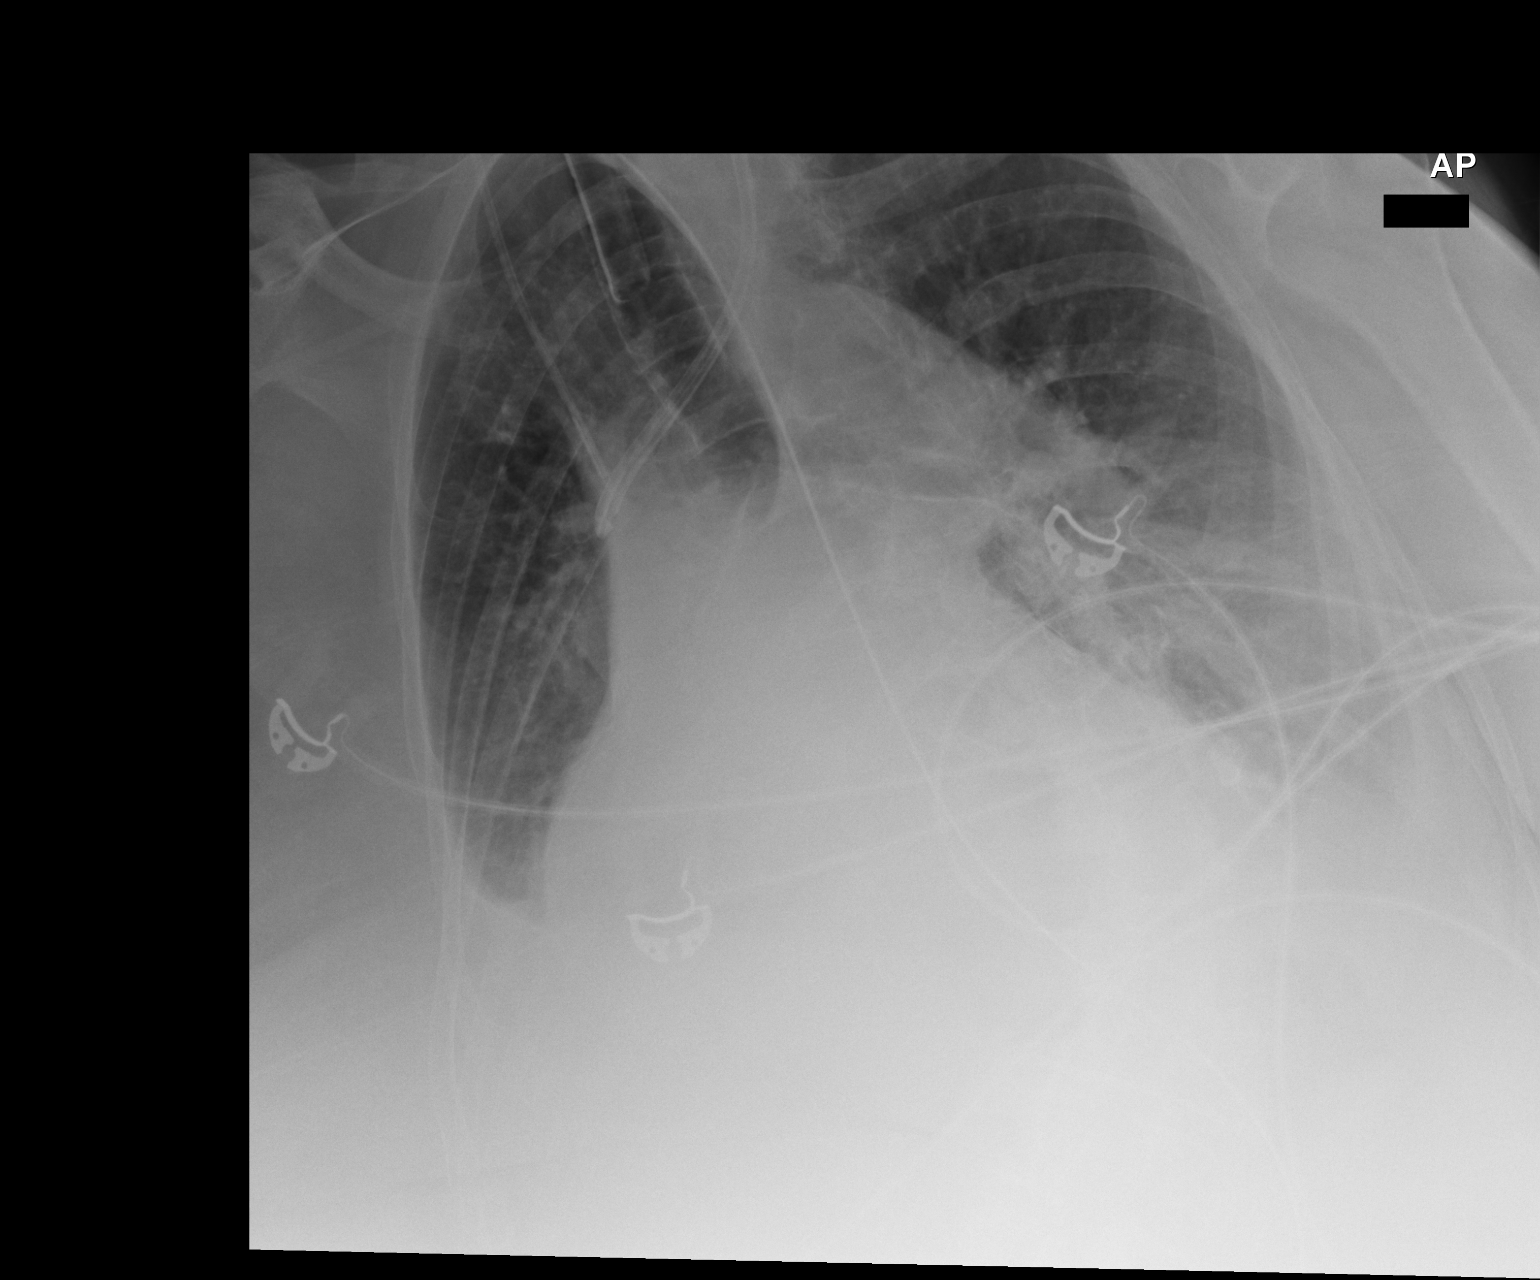

[1 of 1 positions shown; findings below may reference images not displayed]

FINDINGS: Endotracheal tube is in place with tip approximately cm above the
carina. Left IJ central line tip overlies the level of the
brachiocephalic vein -SVC confluence. Right IJ central line tip
overlies the level of the superior vena cava. Stable cardiomegaly.
There are bibasilar opacities, asymmetric and increased on the
right. Overall, these opacities are stable. Bilateral pleural
effusions.
IMPRESSION: Persistent bilateral opacities and cardiomegaly.
